# Patient Record
Sex: Female | Born: 1950 | Race: Black or African American | Hispanic: No | Marital: Single | Smoking: Former smoker
Health system: Southern US, Community
[De-identification: ages and names within clinical notes are randomized; demographics above are authoritative.]

## PROBLEM LIST (undated history)

## (undated) ENCOUNTER — Emergency Department (HOSPITAL_COMMUNITY): Admission: EM | Payer: Medicare HMO | Source: Home / Self Care

## (undated) DIAGNOSIS — D649 Anemia, unspecified: Secondary | ICD-10-CM

## (undated) DIAGNOSIS — E785 Hyperlipidemia, unspecified: Secondary | ICD-10-CM

## (undated) DIAGNOSIS — E559 Vitamin D deficiency, unspecified: Secondary | ICD-10-CM

## (undated) DIAGNOSIS — I739 Peripheral vascular disease, unspecified: Secondary | ICD-10-CM

## (undated) DIAGNOSIS — E119 Type 2 diabetes mellitus without complications: Secondary | ICD-10-CM

## (undated) DIAGNOSIS — N183 Chronic kidney disease, stage 3 unspecified: Secondary | ICD-10-CM

## (undated) DIAGNOSIS — I2510 Atherosclerotic heart disease of native coronary artery without angina pectoris: Secondary | ICD-10-CM

## (undated) DIAGNOSIS — E039 Hypothyroidism, unspecified: Secondary | ICD-10-CM

## (undated) DIAGNOSIS — I639 Cerebral infarction, unspecified: Secondary | ICD-10-CM

## (undated) DIAGNOSIS — K219 Gastro-esophageal reflux disease without esophagitis: Secondary | ICD-10-CM

## (undated) DIAGNOSIS — I10 Essential (primary) hypertension: Secondary | ICD-10-CM

## (undated) HISTORY — DX: Atherosclerotic heart disease of native coronary artery without angina pectoris: I25.10

## (undated) HISTORY — DX: Anemia, unspecified: D64.9

## (undated) HISTORY — DX: Vitamin D deficiency, unspecified: E55.9

## (undated) HISTORY — PX: TUBAL LIGATION: SHX77

## (undated) HISTORY — DX: Hypothyroidism, unspecified: E03.9

## (undated) HISTORY — DX: Chronic kidney disease, stage 3 unspecified: N18.30

## (undated) HISTORY — DX: Chronic kidney disease, stage 3 (moderate): N18.3

## (undated) HISTORY — DX: Type 2 diabetes mellitus without complications: E11.9

## (undated) HISTORY — DX: Hyperlipidemia, unspecified: E78.5

---

## 1989-01-10 DIAGNOSIS — E119 Type 2 diabetes mellitus without complications: Secondary | ICD-10-CM | POA: Insufficient documentation

## 1989-01-10 DIAGNOSIS — E1122 Type 2 diabetes mellitus with diabetic chronic kidney disease: Secondary | ICD-10-CM

## 1989-01-10 DIAGNOSIS — N183 Chronic kidney disease, stage 3 (moderate): Secondary | ICD-10-CM

## 1998-11-13 DIAGNOSIS — E119 Type 2 diabetes mellitus without complications: Secondary | ICD-10-CM

## 1998-11-13 HISTORY — DX: Type 2 diabetes mellitus without complications: E11.9

## 1998-12-09 ENCOUNTER — Encounter: Admission: RE | Admit: 1998-12-09 | Discharge: 1998-12-09 | Payer: Self-pay | Admitting: Family Medicine

## 1999-03-17 ENCOUNTER — Emergency Department (HOSPITAL_COMMUNITY): Admission: EM | Admit: 1999-03-17 | Discharge: 1999-03-17 | Payer: Self-pay | Admitting: Emergency Medicine

## 1999-03-28 ENCOUNTER — Encounter: Admission: RE | Admit: 1999-03-28 | Discharge: 1999-03-28 | Payer: Self-pay | Admitting: Family Medicine

## 1999-04-04 ENCOUNTER — Encounter: Admission: RE | Admit: 1999-04-04 | Discharge: 1999-04-04 | Payer: Self-pay | Admitting: Family Medicine

## 1999-04-05 ENCOUNTER — Encounter: Admission: RE | Admit: 1999-04-05 | Discharge: 1999-04-05 | Payer: Self-pay | Admitting: Sports Medicine

## 1999-04-12 ENCOUNTER — Encounter: Admission: RE | Admit: 1999-04-12 | Discharge: 1999-04-12 | Payer: Self-pay | Admitting: Family Medicine

## 1999-07-13 ENCOUNTER — Encounter: Admission: RE | Admit: 1999-07-13 | Discharge: 1999-07-13 | Payer: Self-pay | Admitting: Family Medicine

## 1999-12-13 ENCOUNTER — Encounter: Admission: RE | Admit: 1999-12-13 | Discharge: 1999-12-13 | Payer: Self-pay | Admitting: Sports Medicine

## 2000-02-05 ENCOUNTER — Emergency Department (HOSPITAL_COMMUNITY): Admission: EM | Admit: 2000-02-05 | Discharge: 2000-02-05 | Payer: Self-pay | Admitting: Emergency Medicine

## 2000-04-05 ENCOUNTER — Emergency Department (HOSPITAL_COMMUNITY): Admission: EM | Admit: 2000-04-05 | Discharge: 2000-04-05 | Payer: Self-pay | Admitting: Emergency Medicine

## 2000-04-06 ENCOUNTER — Encounter: Payer: Self-pay | Admitting: Emergency Medicine

## 2000-04-13 ENCOUNTER — Encounter: Admission: RE | Admit: 2000-04-13 | Discharge: 2000-04-13 | Payer: Self-pay | Admitting: Family Medicine

## 2000-04-19 ENCOUNTER — Encounter: Admission: RE | Admit: 2000-04-19 | Discharge: 2000-04-19 | Payer: Self-pay | Admitting: *Deleted

## 2001-01-28 ENCOUNTER — Encounter: Admission: RE | Admit: 2001-01-28 | Discharge: 2001-01-28 | Payer: Self-pay | Admitting: Family Medicine

## 2001-02-22 ENCOUNTER — Encounter: Admission: RE | Admit: 2001-02-22 | Discharge: 2001-02-22 | Payer: Self-pay | Admitting: Family Medicine

## 2001-03-21 ENCOUNTER — Encounter: Admission: RE | Admit: 2001-03-21 | Discharge: 2001-03-21 | Payer: Self-pay | Admitting: Family Medicine

## 2001-03-26 ENCOUNTER — Encounter: Admission: RE | Admit: 2001-03-26 | Discharge: 2001-03-26 | Payer: Self-pay | Admitting: Family Medicine

## 2001-04-04 ENCOUNTER — Encounter: Admission: RE | Admit: 2001-04-04 | Discharge: 2001-04-04 | Payer: Self-pay | Admitting: Family Medicine

## 2001-04-12 ENCOUNTER — Encounter: Admission: RE | Admit: 2001-04-12 | Discharge: 2001-04-12 | Payer: Self-pay | Admitting: Family Medicine

## 2001-04-17 ENCOUNTER — Encounter: Admission: RE | Admit: 2001-04-17 | Discharge: 2001-04-17 | Payer: Self-pay | Admitting: Family Medicine

## 2001-04-22 ENCOUNTER — Encounter: Admission: RE | Admit: 2001-04-22 | Discharge: 2001-04-22 | Payer: Self-pay | Admitting: *Deleted

## 2001-04-23 ENCOUNTER — Ambulatory Visit (HOSPITAL_COMMUNITY): Admission: RE | Admit: 2001-04-23 | Discharge: 2001-04-23 | Payer: Self-pay | Admitting: *Deleted

## 2001-05-06 ENCOUNTER — Encounter: Admission: RE | Admit: 2001-05-06 | Discharge: 2001-05-06 | Payer: Self-pay | Admitting: Family Medicine

## 2001-06-11 ENCOUNTER — Encounter: Payer: Self-pay | Admitting: Emergency Medicine

## 2001-06-11 ENCOUNTER — Inpatient Hospital Stay (HOSPITAL_COMMUNITY): Admission: EM | Admit: 2001-06-11 | Discharge: 2001-06-13 | Payer: Self-pay | Admitting: Emergency Medicine

## 2001-06-12 ENCOUNTER — Encounter: Payer: Self-pay | Admitting: Emergency Medicine

## 2001-06-13 ENCOUNTER — Encounter: Payer: Self-pay | Admitting: Emergency Medicine

## 2001-06-26 ENCOUNTER — Encounter: Payer: Self-pay | Admitting: Family Medicine

## 2001-06-26 ENCOUNTER — Ambulatory Visit (HOSPITAL_COMMUNITY): Admission: RE | Admit: 2001-06-26 | Discharge: 2001-06-26 | Payer: Self-pay | Admitting: Family Medicine

## 2001-07-23 ENCOUNTER — Encounter: Admission: RE | Admit: 2001-07-23 | Discharge: 2001-07-23 | Payer: Self-pay | Admitting: Family Medicine

## 2001-10-16 ENCOUNTER — Encounter: Admission: RE | Admit: 2001-10-16 | Discharge: 2001-10-16 | Payer: Self-pay | Admitting: Family Medicine

## 2002-01-14 ENCOUNTER — Emergency Department (HOSPITAL_COMMUNITY): Admission: EM | Admit: 2002-01-14 | Discharge: 2002-01-15 | Payer: Self-pay | Admitting: *Deleted

## 2002-02-25 ENCOUNTER — Encounter: Admission: RE | Admit: 2002-02-25 | Discharge: 2002-02-25 | Payer: Self-pay | Admitting: Family Medicine

## 2002-03-14 ENCOUNTER — Encounter: Admission: RE | Admit: 2002-03-14 | Discharge: 2002-03-14 | Payer: Self-pay | Admitting: Family Medicine

## 2002-03-18 ENCOUNTER — Encounter: Admission: RE | Admit: 2002-03-18 | Discharge: 2002-03-18 | Payer: Self-pay | Admitting: Sports Medicine

## 2002-04-29 ENCOUNTER — Encounter: Admission: RE | Admit: 2002-04-29 | Discharge: 2002-04-29 | Payer: Self-pay | Admitting: Family Medicine

## 2002-09-09 ENCOUNTER — Encounter: Admission: RE | Admit: 2002-09-09 | Discharge: 2002-09-09 | Payer: Self-pay | Admitting: Family Medicine

## 2002-09-23 ENCOUNTER — Encounter: Admission: RE | Admit: 2002-09-23 | Discharge: 2002-09-23 | Payer: Self-pay | Admitting: Family Medicine

## 2002-09-25 ENCOUNTER — Encounter: Admission: RE | Admit: 2002-09-25 | Discharge: 2002-09-25 | Payer: Self-pay | Admitting: Sports Medicine

## 2002-10-15 ENCOUNTER — Encounter: Admission: RE | Admit: 2002-10-15 | Discharge: 2002-10-15 | Payer: Self-pay | Admitting: Family Medicine

## 2002-10-30 ENCOUNTER — Encounter: Admission: RE | Admit: 2002-10-30 | Discharge: 2002-10-30 | Payer: Self-pay | Admitting: Family Medicine

## 2003-02-26 ENCOUNTER — Encounter: Payer: Self-pay | Admitting: Family Medicine

## 2003-02-26 ENCOUNTER — Encounter: Admission: RE | Admit: 2003-02-26 | Discharge: 2003-02-26 | Payer: Self-pay | Admitting: Sports Medicine

## 2003-02-26 ENCOUNTER — Inpatient Hospital Stay (HOSPITAL_COMMUNITY): Admission: AD | Admit: 2003-02-26 | Discharge: 2003-02-28 | Payer: Self-pay | Admitting: Family Medicine

## 2003-02-27 ENCOUNTER — Encounter: Payer: Self-pay | Admitting: Cardiology

## 2003-03-11 ENCOUNTER — Encounter: Admission: RE | Admit: 2003-03-11 | Discharge: 2003-03-11 | Payer: Self-pay | Admitting: Family Medicine

## 2003-03-31 ENCOUNTER — Encounter: Admission: RE | Admit: 2003-03-31 | Discharge: 2003-06-29 | Payer: Self-pay | Admitting: Sports Medicine

## 2003-05-12 ENCOUNTER — Encounter: Admission: RE | Admit: 2003-05-12 | Discharge: 2003-05-12 | Payer: Self-pay | Admitting: Family Medicine

## 2003-06-26 ENCOUNTER — Encounter (INDEPENDENT_AMBULATORY_CARE_PROVIDER_SITE_OTHER): Payer: Self-pay | Admitting: *Deleted

## 2003-06-26 ENCOUNTER — Ambulatory Visit (HOSPITAL_COMMUNITY): Admission: RE | Admit: 2003-06-26 | Discharge: 2003-06-26 | Payer: Self-pay | Admitting: Gastroenterology

## 2003-08-07 ENCOUNTER — Encounter: Admission: RE | Admit: 2003-08-07 | Discharge: 2003-08-07 | Payer: Self-pay | Admitting: Family Medicine

## 2003-09-24 ENCOUNTER — Encounter: Admission: RE | Admit: 2003-09-24 | Discharge: 2003-09-24 | Payer: Self-pay | Admitting: Sports Medicine

## 2003-10-07 ENCOUNTER — Encounter: Admission: RE | Admit: 2003-10-07 | Discharge: 2003-10-07 | Payer: Self-pay | Admitting: Family Medicine

## 2003-11-10 ENCOUNTER — Encounter: Admission: RE | Admit: 2003-11-10 | Discharge: 2003-11-10 | Payer: Self-pay | Admitting: Family Medicine

## 2003-11-14 ENCOUNTER — Encounter (INDEPENDENT_AMBULATORY_CARE_PROVIDER_SITE_OTHER): Payer: Self-pay | Admitting: *Deleted

## 2003-11-14 LAB — CONVERTED CEMR LAB

## 2003-11-27 ENCOUNTER — Encounter: Admission: RE | Admit: 2003-11-27 | Discharge: 2003-11-27 | Payer: Self-pay | Admitting: Family Medicine

## 2003-12-17 ENCOUNTER — Encounter: Admission: RE | Admit: 2003-12-17 | Discharge: 2003-12-17 | Payer: Self-pay | Admitting: Family Medicine

## 2004-02-11 ENCOUNTER — Encounter: Admission: RE | Admit: 2004-02-11 | Discharge: 2004-02-11 | Payer: Self-pay | Admitting: Sports Medicine

## 2004-02-23 ENCOUNTER — Encounter: Admission: RE | Admit: 2004-02-23 | Discharge: 2004-02-23 | Payer: Self-pay | Admitting: Sports Medicine

## 2004-03-09 ENCOUNTER — Encounter: Admission: RE | Admit: 2004-03-09 | Discharge: 2004-03-09 | Payer: Self-pay | Admitting: Family Medicine

## 2004-03-24 ENCOUNTER — Encounter: Admission: RE | Admit: 2004-03-24 | Discharge: 2004-03-24 | Payer: Self-pay | Admitting: Sports Medicine

## 2004-03-31 ENCOUNTER — Encounter: Admission: RE | Admit: 2004-03-31 | Discharge: 2004-03-31 | Payer: Self-pay | Admitting: Sports Medicine

## 2004-04-25 ENCOUNTER — Encounter: Admission: RE | Admit: 2004-04-25 | Discharge: 2004-04-25 | Payer: Self-pay | Admitting: Family Medicine

## 2004-06-13 ENCOUNTER — Encounter: Admission: RE | Admit: 2004-06-13 | Discharge: 2004-06-13 | Payer: Self-pay | Admitting: Sports Medicine

## 2004-07-22 ENCOUNTER — Inpatient Hospital Stay (HOSPITAL_COMMUNITY): Admission: EM | Admit: 2004-07-22 | Discharge: 2004-07-25 | Payer: Self-pay | Admitting: Emergency Medicine

## 2004-07-29 ENCOUNTER — Ambulatory Visit: Payer: Self-pay | Admitting: Family Medicine

## 2004-09-01 ENCOUNTER — Ambulatory Visit: Payer: Self-pay | Admitting: Family Medicine

## 2004-09-05 ENCOUNTER — Ambulatory Visit: Payer: Self-pay | Admitting: Family Medicine

## 2004-09-06 ENCOUNTER — Ambulatory Visit: Payer: Self-pay | Admitting: *Deleted

## 2004-09-26 ENCOUNTER — Ambulatory Visit: Payer: Self-pay | Admitting: Family Medicine

## 2004-10-28 ENCOUNTER — Ambulatory Visit: Payer: Self-pay | Admitting: Family Medicine

## 2005-01-04 ENCOUNTER — Ambulatory Visit: Payer: Self-pay | Admitting: Nurse Practitioner

## 2005-01-20 ENCOUNTER — Ambulatory Visit: Payer: Self-pay | Admitting: Family Medicine

## 2005-02-27 ENCOUNTER — Ambulatory Visit: Payer: Self-pay | Admitting: Family Medicine

## 2005-03-02 ENCOUNTER — Ambulatory Visit: Payer: Self-pay | Admitting: Internal Medicine

## 2005-04-07 ENCOUNTER — Ambulatory Visit: Payer: Self-pay | Admitting: Family Medicine

## 2005-04-17 ENCOUNTER — Emergency Department (HOSPITAL_COMMUNITY): Admission: EM | Admit: 2005-04-17 | Discharge: 2005-04-17 | Payer: Self-pay | Admitting: Emergency Medicine

## 2005-04-21 ENCOUNTER — Ambulatory Visit: Payer: Self-pay | Admitting: Family Medicine

## 2005-06-29 ENCOUNTER — Ambulatory Visit: Payer: Self-pay | Admitting: Nurse Practitioner

## 2005-07-21 ENCOUNTER — Ambulatory Visit: Payer: Self-pay | Admitting: Family Medicine

## 2005-08-07 ENCOUNTER — Ambulatory Visit: Payer: Self-pay | Admitting: Family Medicine

## 2005-08-28 ENCOUNTER — Encounter: Admission: RE | Admit: 2005-08-28 | Discharge: 2005-08-28 | Payer: Self-pay | Admitting: Family Medicine

## 2006-02-02 ENCOUNTER — Ambulatory Visit: Payer: Self-pay | Admitting: Family Medicine

## 2006-04-30 ENCOUNTER — Ambulatory Visit: Payer: Self-pay | Admitting: Family Medicine

## 2006-05-01 ENCOUNTER — Ambulatory Visit: Payer: Self-pay | Admitting: Family Medicine

## 2006-06-25 ENCOUNTER — Ambulatory Visit: Payer: Self-pay | Admitting: Family Medicine

## 2006-07-31 ENCOUNTER — Ambulatory Visit: Payer: Self-pay | Admitting: Family Medicine

## 2006-09-26 ENCOUNTER — Encounter: Admission: RE | Admit: 2006-09-26 | Discharge: 2006-09-26 | Payer: Self-pay | Admitting: Family Medicine

## 2006-10-03 ENCOUNTER — Ambulatory Visit: Payer: Self-pay | Admitting: Family Medicine

## 2006-10-11 ENCOUNTER — Ambulatory Visit: Payer: Self-pay | Admitting: *Deleted

## 2007-01-04 ENCOUNTER — Ambulatory Visit: Payer: Self-pay | Admitting: Internal Medicine

## 2007-01-10 DIAGNOSIS — E039 Hypothyroidism, unspecified: Secondary | ICD-10-CM

## 2007-01-10 DIAGNOSIS — Z87891 Personal history of nicotine dependence: Secondary | ICD-10-CM

## 2007-01-10 DIAGNOSIS — K921 Melena: Secondary | ICD-10-CM | POA: Insufficient documentation

## 2007-01-10 DIAGNOSIS — R809 Proteinuria, unspecified: Secondary | ICD-10-CM

## 2007-01-10 DIAGNOSIS — I10 Essential (primary) hypertension: Secondary | ICD-10-CM | POA: Insufficient documentation

## 2007-01-10 DIAGNOSIS — Z8673 Personal history of transient ischemic attack (TIA), and cerebral infarction without residual deficits: Secondary | ICD-10-CM

## 2007-01-10 DIAGNOSIS — R269 Unspecified abnormalities of gait and mobility: Secondary | ICD-10-CM

## 2007-01-10 DIAGNOSIS — E785 Hyperlipidemia, unspecified: Secondary | ICD-10-CM | POA: Insufficient documentation

## 2007-01-11 ENCOUNTER — Encounter (INDEPENDENT_AMBULATORY_CARE_PROVIDER_SITE_OTHER): Payer: Self-pay | Admitting: *Deleted

## 2007-04-04 ENCOUNTER — Ambulatory Visit: Payer: Self-pay | Admitting: Internal Medicine

## 2007-06-24 ENCOUNTER — Inpatient Hospital Stay (HOSPITAL_COMMUNITY): Admission: EM | Admit: 2007-06-24 | Discharge: 2007-06-27 | Payer: Self-pay | Admitting: Emergency Medicine

## 2007-07-31 ENCOUNTER — Encounter (INDEPENDENT_AMBULATORY_CARE_PROVIDER_SITE_OTHER): Payer: Self-pay | Admitting: *Deleted

## 2007-11-13 ENCOUNTER — Encounter: Admission: RE | Admit: 2007-11-13 | Discharge: 2007-11-13 | Payer: Self-pay | Admitting: Family Medicine

## 2007-11-22 ENCOUNTER — Ambulatory Visit: Payer: Self-pay | Admitting: Family Medicine

## 2007-11-22 LAB — CONVERTED CEMR LAB: Microalb, Ur: 0.96 mg/dL (ref 0.00–1.89)

## 2008-01-02 ENCOUNTER — Ambulatory Visit: Payer: Self-pay | Admitting: Family Medicine

## 2008-01-31 ENCOUNTER — Ambulatory Visit: Payer: Self-pay | Admitting: Family Medicine

## 2008-03-02 ENCOUNTER — Encounter (INDEPENDENT_AMBULATORY_CARE_PROVIDER_SITE_OTHER): Payer: Self-pay | Admitting: Family Medicine

## 2008-03-02 ENCOUNTER — Ambulatory Visit: Payer: Self-pay | Admitting: Internal Medicine

## 2008-07-16 ENCOUNTER — Ambulatory Visit: Payer: Self-pay | Admitting: Internal Medicine

## 2008-08-11 ENCOUNTER — Ambulatory Visit (HOSPITAL_COMMUNITY): Admission: RE | Admit: 2008-08-11 | Discharge: 2008-08-11 | Payer: Self-pay | Admitting: Family Medicine

## 2008-09-03 ENCOUNTER — Encounter (INDEPENDENT_AMBULATORY_CARE_PROVIDER_SITE_OTHER): Payer: Self-pay | Admitting: Family Medicine

## 2008-12-28 ENCOUNTER — Encounter: Admission: RE | Admit: 2008-12-28 | Discharge: 2009-01-26 | Payer: Self-pay | Admitting: Family Medicine

## 2009-12-03 ENCOUNTER — Ambulatory Visit: Payer: Self-pay | Admitting: Family Medicine

## 2010-01-04 ENCOUNTER — Ambulatory Visit: Payer: Self-pay | Admitting: Family Medicine

## 2010-01-04 LAB — CONVERTED CEMR LAB
AST: 16 units/L (ref 0–37)
Albumin: 4.7 g/dL (ref 3.5–5.2)
Basophils Absolute: 0 10*3/uL (ref 0.0–0.1)
Basophils Relative: 0 % (ref 0–1)
Chloride: 101 meq/L (ref 96–112)
Eosinophils Relative: 1 % (ref 0–5)
Glucose, Bld: 143 mg/dL — ABNORMAL HIGH (ref 70–99)
HDL: 42 mg/dL (ref >39)
LDL Cholesterol: 65 mg/dL (ref 0–99)
Lymphs Abs: 2.6 10*3/uL (ref 0.7–4.0)
Neutro Abs: 3 10*3/uL (ref 1.7–7.7)
Neutrophils Relative %: 50 % (ref 43–77)
Potassium: 4 meq/L (ref 3.5–5.3)
Total CHOL/HDL Ratio: 4.4
VLDL: 76 mg/dL — ABNORMAL HIGH (ref 0–40)

## 2010-04-05 ENCOUNTER — Ambulatory Visit: Payer: Self-pay | Admitting: Family Medicine

## 2010-04-05 LAB — CONVERTED CEMR LAB: TSH: 14.138 microintl units/mL — ABNORMAL HIGH (ref 0.350–4.500)

## 2010-05-10 ENCOUNTER — Ambulatory Visit (HOSPITAL_COMMUNITY): Admission: RE | Admit: 2010-05-10 | Discharge: 2010-05-10 | Payer: Self-pay | Admitting: Family Medicine

## 2010-05-10 ENCOUNTER — Encounter (INDEPENDENT_AMBULATORY_CARE_PROVIDER_SITE_OTHER): Payer: Self-pay | Admitting: Family Medicine

## 2010-05-10 ENCOUNTER — Ambulatory Visit: Payer: Self-pay | Admitting: Surgery

## 2010-07-21 ENCOUNTER — Encounter (INDEPENDENT_AMBULATORY_CARE_PROVIDER_SITE_OTHER): Payer: Self-pay | Admitting: *Deleted

## 2010-09-05 ENCOUNTER — Encounter (INDEPENDENT_AMBULATORY_CARE_PROVIDER_SITE_OTHER): Payer: Self-pay | Admitting: *Deleted

## 2010-11-08 ENCOUNTER — Encounter (INDEPENDENT_AMBULATORY_CARE_PROVIDER_SITE_OTHER): Payer: Self-pay | Admitting: Family Medicine

## 2010-11-08 LAB — CONVERTED CEMR LAB
Chloride: 105 meq/L (ref 96–112)
Glucose, Bld: 184 mg/dL — ABNORMAL HIGH (ref 70–99)
Microalb, Ur: 1.49 mg/dL (ref 0.00–1.89)
Potassium: 4.2 meq/L (ref 3.5–5.3)
TSH: 4.315 microintl units/mL (ref 0.350–4.500)

## 2010-12-04 ENCOUNTER — Encounter: Payer: Self-pay | Admitting: Family Medicine

## 2010-12-15 NOTE — Letter (Signed)
Summary: New Patient letter  [facility]  [address] [place], [place] [address]   Phone: [phone]  Fax: [phone]       09/05/2010 MRN: [id number]  [facility] [address] [place], [place]  [address]  Dear Ms. Lucci,  Welcome to the Gastroenterology Division at [facility].    You are scheduled to see Dr. Christella Hartigan on 10/18/2010 at 3:30PM on the 3rd floor at [facility], [address].  We ask that you try to arrive at our office 15 minutes prior to your appointment time to allow for check-in.  We would like you to complete the enclosed self-administered evaluation form prior to your visit and bring it with you on the day of your appointment.  We will review it with you.  Also, please bring a complete list of all your medications or, if you prefer, bring the medication bottles and we will list them.  Please bring your insurance card so that we may make a copy of it.  If your insurance requires a referral to see a specialist, please bring your referral form from your primary care physician.  Co-payments are due at the time of your visit and may be paid by cash, check or credit card.     Your office visit will consist of a consult with your physician (includes a physical exam), any laboratory testing he/she may order, scheduling of any necessary diagnostic testing (e.g. x-ray, ultrasound, CT-scan), and scheduling of a procedure (e.g. Endoscopy, Colonoscopy) if required.  Please allow enough time on your schedule to allow for any/all of these possibilities.    If you cannot keep your appointment, please call [phone] to cancel or reschedule prior to your appointment date.  This allows [place] the opportunity to schedule an appointment for another patient in need of care.  If you do not cancel or reschedule by 5 p.m. the business day prior to your appointment date, you will be charged a $50.00 late cancellation/no-show fee.    Thank you for choosing [facility] for your medical needs.  We appreciate the opportunity to care for you.  Please visit [place] at our website  to learn more about our practice.                     Sincerely,                                                             The Gastroenterology Division

## 2010-12-15 NOTE — Letter (Signed)
Summary: [facility] Referral (unable to schedule) Notification  [facility]  [address] [place], [place] [address]   Phone: [phone]  Fax: [phone]      July 21, 2010 [name] [birth year] MRN: [id number]   [facility] [address] [place], [place]  [address]   Dear Dr.MCPHERSON:   Thank you for your kind referral of the above patient. We have attempted to schedule the recommended COLONOSCOPY but have been unable to schedule because:  _X_ The patient was not available by phone and/or has not returned our calls.  __ The patient declined to schedule the procedure at this time.  We appreciate the referral and hope that we will have the opportunity to treat this patient in the future.    Sincerely,   [facility]  Vania Rea. Jarold Motto M.D. Hedwig Morton. Juanda Chance M.D. Venita Lick. Russella Dar M.D. Wilhemina Bonito. Marina Goodell M.D. Barbette Hair. Arlyce Dice M.D. Iva Boop M.D. Cheron Every.D.

## 2011-01-12 DIAGNOSIS — I639 Cerebral infarction, unspecified: Secondary | ICD-10-CM

## 2011-01-12 HISTORY — DX: Cerebral infarction, unspecified: I63.9

## 2011-01-19 ENCOUNTER — Inpatient Hospital Stay (INDEPENDENT_AMBULATORY_CARE_PROVIDER_SITE_OTHER)
Admission: RE | Admit: 2011-01-19 | Discharge: 2011-01-19 | Disposition: A | Discharge status: Home / Self Care | Payer: Medicaid Other | Source: Ambulatory Visit | Attending: Emergency Medicine | Admitting: Emergency Medicine

## 2011-01-19 DIAGNOSIS — H8110 Benign paroxysmal vertigo, unspecified ear: Secondary | ICD-10-CM

## 2011-01-19 DIAGNOSIS — I10 Essential (primary) hypertension: Secondary | ICD-10-CM

## 2011-01-19 DIAGNOSIS — E119 Type 2 diabetes mellitus without complications: Secondary | ICD-10-CM

## 2011-01-19 DIAGNOSIS — S139XXA Sprain of joints and ligaments of unspecified parts of neck, initial encounter: Secondary | ICD-10-CM

## 2011-01-19 LAB — POCT I-STAT, CHEM 8
Calcium, Ion: 1.2 mmol/L (ref 1.12–1.32)
Chloride: 101 mEq/L (ref 96–112)
Potassium: 3.8 mEq/L (ref 3.5–5.1)
Sodium: 139 mEq/L (ref 135–145)

## 2011-01-27 ENCOUNTER — Emergency Department (HOSPITAL_COMMUNITY): Payer: Medicaid Other

## 2011-01-27 ENCOUNTER — Inpatient Hospital Stay (HOSPITAL_COMMUNITY)
Admission: EM | Admit: 2011-01-27 | Discharge: 2011-01-31 | DRG: 065 | Disposition: A | Discharge status: Home Health | Payer: Medicaid Other | Attending: Internal Medicine | Admitting: Internal Medicine

## 2011-01-27 DIAGNOSIS — E890 Postprocedural hypothyroidism: Secondary | ICD-10-CM | POA: Diagnosis present

## 2011-01-27 DIAGNOSIS — IMO0001 Reserved for inherently not codable concepts without codable children: Secondary | ICD-10-CM | POA: Diagnosis present

## 2011-01-27 DIAGNOSIS — E785 Hyperlipidemia, unspecified: Secondary | ICD-10-CM | POA: Diagnosis present

## 2011-01-27 DIAGNOSIS — Z8673 Personal history of transient ischemic attack (TIA), and cerebral infarction without residual deficits: Secondary | ICD-10-CM

## 2011-01-27 DIAGNOSIS — N179 Acute kidney failure, unspecified: Secondary | ICD-10-CM | POA: Diagnosis present

## 2011-01-27 DIAGNOSIS — N390 Urinary tract infection, site not specified: Secondary | ICD-10-CM | POA: Diagnosis present

## 2011-01-27 DIAGNOSIS — F17200 Nicotine dependence, unspecified, uncomplicated: Secondary | ICD-10-CM | POA: Diagnosis present

## 2011-01-27 DIAGNOSIS — I129 Hypertensive chronic kidney disease with stage 1 through stage 4 chronic kidney disease, or unspecified chronic kidney disease: Secondary | ICD-10-CM | POA: Diagnosis present

## 2011-01-27 DIAGNOSIS — I739 Peripheral vascular disease, unspecified: Secondary | ICD-10-CM | POA: Diagnosis present

## 2011-01-27 DIAGNOSIS — K219 Gastro-esophageal reflux disease without esophagitis: Secondary | ICD-10-CM | POA: Diagnosis present

## 2011-01-27 DIAGNOSIS — I6350 Cerebral infarction due to unspecified occlusion or stenosis of unspecified cerebral artery: Principal | ICD-10-CM | POA: Diagnosis present

## 2011-01-27 DIAGNOSIS — R4789 Other speech disturbances: Secondary | ICD-10-CM | POA: Diagnosis present

## 2011-01-27 DIAGNOSIS — R42 Dizziness and giddiness: Secondary | ICD-10-CM | POA: Diagnosis present

## 2011-01-27 DIAGNOSIS — N189 Chronic kidney disease, unspecified: Secondary | ICD-10-CM | POA: Diagnosis present

## 2011-01-27 LAB — CBC
HCT: 39.4 % (ref 36.0–46.0)
MCV: 91.2 fL (ref 78.0–100.0)
Platelets: 235 10*3/uL (ref 150–400)
RBC: 4.32 MIL/uL (ref 3.87–5.11)
RDW: 15.5 % (ref 11.5–15.5)
WBC: 7 10*3/uL (ref 4.0–10.5)

## 2011-01-27 LAB — DIFFERENTIAL
Basophils Relative: 0 % (ref 0–1)
Lymphocytes Relative: 50 % — ABNORMAL HIGH (ref 12–46)
Lymphs Abs: 3.5 10*3/uL (ref 0.7–4.0)
Monocytes Absolute: 0.5 10*3/uL (ref 0.1–1.0)
Monocytes Relative: 7 % (ref 3–12)
Neutro Abs: 2.9 10*3/uL (ref 1.7–7.7)

## 2011-01-27 LAB — COMPREHENSIVE METABOLIC PANEL
ALT: 14 U/L (ref 0–35)
Albumin: 4.1 g/dL (ref 3.5–5.2)
Alkaline Phosphatase: 66 U/L (ref 39–117)
Creatinine, Ser: 1.45 mg/dL — ABNORMAL HIGH (ref 0.4–1.2)
GFR calc Af Amer: 45 mL/min — ABNORMAL LOW (ref >60)
GFR calc non Af Amer: 37 mL/min — ABNORMAL LOW (ref >60)
Sodium: 140 mEq/L (ref 135–145)
Total Bilirubin: 0.4 mg/dL (ref 0.3–1.2)

## 2011-01-27 LAB — URINALYSIS, ROUTINE W REFLEX MICROSCOPIC
Bilirubin Urine: NEGATIVE
Glucose, UA: NEGATIVE mg/dL
Ketones, ur: NEGATIVE mg/dL
Specific Gravity, Urine: 1.023 (ref 1.005–1.030)
Urobilinogen, UA: 0.2 mg/dL (ref 0.0–1.0)
pH: 5.5 (ref 5.0–8.0)

## 2011-01-27 LAB — POCT CARDIAC MARKERS: CKMB, poc: 1.1 ng/mL (ref 1.0–8.0)

## 2011-01-27 LAB — URINE MICROSCOPIC-ADD ON

## 2011-01-27 LAB — PROTIME-INR
INR: 0.9 (ref 0.00–1.49)
Prothrombin Time: 12.4 seconds (ref 11.6–15.2)

## 2011-01-27 LAB — APTT: aPTT: 46 seconds — ABNORMAL HIGH (ref 24–37)

## 2011-01-28 ENCOUNTER — Inpatient Hospital Stay (HOSPITAL_COMMUNITY): Payer: Medicaid Other

## 2011-01-28 DIAGNOSIS — I6789 Other cerebrovascular disease: Secondary | ICD-10-CM

## 2011-01-28 LAB — BASIC METABOLIC PANEL
BUN: 28 mg/dL — ABNORMAL HIGH (ref 6–23)
Calcium: 9.6 mg/dL (ref 8.4–10.5)
GFR calc non Af Amer: 45 mL/min — ABNORMAL LOW (ref >60)
Glucose, Bld: 187 mg/dL — ABNORMAL HIGH (ref 70–99)
Potassium: 3.9 mEq/L (ref 3.5–5.1)
Sodium: 137 mEq/L (ref 135–145)

## 2011-01-28 LAB — GLUCOSE, CAPILLARY
Glucose-Capillary: 316 mg/dL — ABNORMAL HIGH (ref 70–99)
Glucose-Capillary: 175 mg/dL — ABNORMAL HIGH (ref 70–99)

## 2011-01-28 LAB — LIPID PANEL
Cholesterol: 170 mg/dL (ref 0–200)
HDL: 30 mg/dL — ABNORMAL LOW (ref >39)
Triglycerides: 431 mg/dL — ABNORMAL HIGH (ref <150)

## 2011-01-28 LAB — HOMOCYSTEINE: Homocysteine: 11.7 umol/L (ref 4.0–15.4)

## 2011-01-29 LAB — CBC
MCH: 28.8 pg (ref 26.0–34.0)
MCV: 90.4 fL (ref 78.0–100.0)
Platelets: 206 10*3/uL (ref 150–400)
RBC: 3.96 MIL/uL (ref 3.87–5.11)
RDW: 15.2 % (ref 11.5–15.5)
WBC: 6.4 10*3/uL (ref 4.0–10.5)

## 2011-01-29 LAB — BASIC METABOLIC PANEL
BUN: 26 mg/dL — ABNORMAL HIGH (ref 6–23)
Chloride: 104 mEq/L (ref 96–112)
Creatinine, Ser: 1.27 mg/dL — ABNORMAL HIGH (ref 0.4–1.2)
GFR calc Af Amer: 52 mL/min — ABNORMAL LOW (ref >60)
GFR calc non Af Amer: 43 mL/min — ABNORMAL LOW (ref >60)

## 2011-01-29 LAB — GLUCOSE, CAPILLARY
Glucose-Capillary: 214 mg/dL — ABNORMAL HIGH (ref 70–99)
Glucose-Capillary: 187 mg/dL — ABNORMAL HIGH (ref 70–99)

## 2011-01-30 DIAGNOSIS — I6350 Cerebral infarction due to unspecified occlusion or stenosis of unspecified cerebral artery: Secondary | ICD-10-CM

## 2011-01-30 LAB — BASIC METABOLIC PANEL
BUN: 24 mg/dL — ABNORMAL HIGH (ref 6–23)
CO2: 28 mEq/L (ref 19–32)
Calcium: 9.5 mg/dL (ref 8.4–10.5)
Creatinine, Ser: 1.24 mg/dL — ABNORMAL HIGH (ref 0.4–1.2)
GFR calc non Af Amer: 44 mL/min — ABNORMAL LOW (ref >60)
Glucose, Bld: 162 mg/dL — ABNORMAL HIGH (ref 70–99)

## 2011-01-30 LAB — PROTEIN C ACTIVITY: Protein C Activity: 191 % — ABNORMAL HIGH (ref 75–133)

## 2011-01-30 LAB — GLUCOSE, CAPILLARY

## 2011-01-30 LAB — PROTEIN S ACTIVITY: Protein S Activity: 97 % (ref 69–129)

## 2011-01-30 LAB — LUPUS ANTICOAGULANT PANEL: PTT Lupus Anticoagulant: 43 secs (ref 30.0–45.6)

## 2011-01-30 LAB — CARDIOLIPIN ANTIBODIES, IGG, IGM, IGA: Anticardiolipin IgM: 3 MPL U/mL — ABNORMAL LOW (ref <11)

## 2011-01-30 LAB — ANTITHROMBIN III: AntiThromb III Func: 102 % (ref 76–126)

## 2011-01-31 LAB — GLUCOSE, CAPILLARY
Glucose-Capillary: 214 mg/dL — ABNORMAL HIGH (ref 70–99)
Glucose-Capillary: 266 mg/dL — ABNORMAL HIGH (ref 70–99)
Glucose-Capillary: 250 mg/dL — ABNORMAL HIGH (ref 70–99)

## 2011-01-31 LAB — PROTEIN C, TOTAL: Protein C, Total: >150 % — ABNORMAL HIGH (ref 70–140)

## 2011-01-31 LAB — FACTOR 5 LEIDEN

## 2011-01-31 LAB — PROTHROMBIN GENE MUTATION

## 2011-01-31 NOTE — Consult Note (Signed)
  [name], [name]                ACCOUNT NO.:  [id number]  MEDICAL RECORD NO.:  [id number]           PATIENT TYPE:  I  LOCATION:  1520                         FACILITY:  [facility]  PHYSICIAN:  Thana Farr, MD    DATE OF BIRTH:  1950/11/17  DATE OF CONSULTATION: DATE OF DISCHARGE:                                CONSULTATION   ADDENDUM: The patient also reports that she has significant neck pain and back pain since her initial stroke in 2004.  Has been tried on tramadol without improvement of her symptoms.  On examination of the back region and cervical region, the patient has muscle contraction noted in the trapezius muscles on the right and then periscapular muscles on the left.  There is pain with palpation in these areas as well.  ASSESSMENT:  Muscle contraction pain.  PLAN:  The patient has been tried on tramadol without benefit, discontinue tramadol and start Flexeril 10 mg p.o. t.i.d. p.r.n. pain.          ______________________________ Thana Farr, MD     LR/MEDQ  D:  01/28/2011  T:  01/29/2011  Job:  [id number]  Electronically Signed by Thana Farr MD on 01/31/2011 11:23:44 AM

## 2011-01-31 NOTE — Consult Note (Signed)
[name], [name]                ACCOUNT NO.:  [id number]  MEDICAL RECORD NO.:  [id number]           PATIENT TYPE:  I  LOCATION:  1520                         FACILITY:  [facility]  PHYSICIAN:  Thana Farr, MD    DATE OF BIRTH:  [birth year]  DATE OF CONSULTATION:  01/28/2011 DATE OF DISCHARGE:                                CONSULTATION   REFERRING PHYSICIAN:  Hartley Barefoot, MD  HISTORY:  [name] is a 60 year old female that about a month ago began to experience some dizziness. Was seen and diagnosed with vertigo.  Then approximately 1 week ago, developed some left-sided weakness and difficulty with balance.  Over the past few days, developed difficulty with speech as well, was brought in for evaluation.  The patient does have a history of stroke in 2004.  Reports that her only residual from that was some right-sided neck pain and problems with her ear. The consult called for further recommendations.  PAST MEDICAL HISTORY: 1. Hypertension. 2. Diabetes. 3. Hypercholesterolemia. 4. Graves disease, status post radioactive iodine and ablation, now     with hypothyroidism. 5. GERD. 6. Peripheral vascular disease. 7. Stroke in 2004.  SOCIAL HISTORY:  The patient smokes.  She has no history of alcohol or illicit drug abuse.  She is a housewife.  MEDICATIONS AT HOME:  Plavix, metformin, levothyroxine, lisinopril, hydrochlorothiazide.  PHYSICAL EXAMINATION:  VITAL SIGNS:  Blood pressure 134/70, heart rate 70, respiratory rate 18, T-max 98.2. MENTAL STATUS TESTING:  The patient is alert and oriented.  She can follow commands without difficulty.  Speech is fluent. CRANIAL NERVE TESTING:  II visual fields intact.  III, IV, VI extraocular movements intact.  V and VII decreased and right nasolabial fold.  VIII grossly intact.  IX and VII positive gag.  XI bilateral shoulder shrug.  XII midline tongue extension. MOTOR EXAM:  The patient has 5/5 strength in the bilateral  upper extremities.  No drift is noted.  In the lower extremities, the patient is 4-/5  strength in the left lower extremity, 5-/5 strength in the right lower extremity.  In lifting the left lower extremity, the patient gives no reciprocal downward movement of the right lower extremity. Each time asked to lift that leg is less and less effective. SENSORY TEST:  Pinprick and light touch are intact bilaterally.  Deep tendon reflexes are 2+ in the upper extremities, 1+ in the knees, and absent at the ankles.  Plantars meet bilaterally. CEREBELLAR TESTING:  Finger-to-nose intact.  Heel-to-shin unable to be performed with the left lower extremity.  Intact with the right lower extremity.  LABORATORY DATA:  Shows a white blood cell count of 7.0, platelet count 235, hemoglobin/hematocrit 12.6 and 39.4.  PT/INR, PTT 12.4, 0.9, 0.6 respectively.  Glucose 187, sodium 137, potassium 3.9, chloride and CO2 of 99 and 28 respectively.  BUN and creatinine 28 and 1.22 respectively. Calcium 9.6, hemoglobin A1c 9.7.  CK 113, triglycerides 431, cholesterol 170, homocysteine 11.7.  Echocardiogram results reviewed and show no evidence of intracardiac thrombi.  MRI of the brain shows an acute to subacute infarct in the genu of in the  mid body of the right corpus callosum.  MRA shows no significant large vessel disease.  There is significant small vessel disease bilaterally.  All films are reviewed today.  ASSESSMENT:  [name] is a 60 year old female with multiple risk factors for stroke to include hypertension, diabetes, hypercholesterolemia, and tobacco abuse admitted with new strokes related to her small vessel disease.  The patient was counseled at length about the relationship of her poorly controlled risk factors and her recurrent strokes.  She is on Plavix at home.  Due to the diffuse  nature of her small vessel nature stenoses, the patient is not a candidate for invasive intervention at this time.   The patient will need to remain on medical management with risk factors to be addressed as well.  On presentation, the patient was not a t-PA candidate with her symptoms having lasted for greater than 5 days  prior to presentation.  PLAN: 1. Start aspirin 81 mg p.o. daily. 2. Risk factor modification. 3. Asked to stop smoking. 4. Agree with PT/OT.          ______________________________ Thana Farr, MD     LR/MEDQ  D:  01/28/2011  T:  01/29/2011  Job:  [id number]  Electronically Signed by Thana Farr MD on 01/31/2011 11:30:11 AM

## 2011-02-07 NOTE — Discharge Summary (Signed)
[name], [name]                ACCOUNT NO.:  [id number]  MEDICAL RECORD NO.:  [id number]           PATIENT TYPE:  I  LOCATION:  1520                         FACILITY:  [facility]  PHYSICIAN:  Hartley Barefoot, MD    DATE OF BIRTH:  Oct 21, 1951  DATE OF ADMISSION:  01/27/2011 DATE OF DISCHARGE:  01/31/2011                              DISCHARGE SUMMARY   DISCHARGE DIAGNOSES: 1. 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 2. Hypertension, uncontrolled. 3. Acute-on-chronic renal failure, improved. 4. Diabetes, uncontrolled. 5. Urinary tract infection.  OTHER PAST MEDICAL HISTORY: 1. Hyperlipidemia. 2. History of Graves disease, status post radiation iodine ablation     resulting in hypothyroidism. 3. Gastroesophageal reflux disease. 4. Peripheral vascular disease.  DISCHARGE MEDICATIONS: 1. Aspirin 81 mg p.o. daily. 2. Gemfibrozil 600 mg twice daily before meals. 3. Hydrochlorothiazide 12.5 mg by mouth daily. 4. Lantus 15 units subcutaneous daily. 5. Norvasc 2.5 mg p.o. daily. 6. Levothyroxine 125 mcg every morning. 7. Plavix 75 mg p.o. daily.  MEDICATIONS STOPPED DURING THIS HOSPITALIZATION:  Metformin and lisinopril.  DISPOSITION AND FOLLOWUP:  [name] will need to follow up with her primary care physician at [facility] to follow up blood sugar level and to consider restarting on metformin if creatinine allows it.  Will need also to consider starting her back on lisinopril if renal function is stable.  She will need physical therapy and speech therapy at home.  RADIOGRAPHIC STUDIES: 1. CT head showed possible acute infarct involving the body of the     corpus callosum bilateral, less on the right.  Multiple sclerosis     is also possibility.  Old subacute right basilar ganglia lacunar     infarct.  Diffuse cerebral and cerebellar cortical atrophy and mild     chronic small vessel primary ischemic changes in both cerebral     hemispheres. 2. MRI of head.   MRI showed acute/subacute infarct involving the genu     and mid body of the right corpus callosum.  Periventricular white     matter changes are advanced for age.  This likely reflects the     sequelae of chronic microvascular ischemia.  Remote lacunar infarct     involving the left and right corona radiate.  MRA of head     showed signal loss within the right pericallosal artery suggesting     high-grade stenosis and/or occlusions and corresponding with     acute/subacute infarct.  High-grade stenosis in the proximal right     A2 segment may contribute.  Approximately 50% stenosis of the right     A segment.  Signal loss in the left A1 segment without focal     stenosis.  Probably stenosis of the distal right M1 segment at the     bifurcation.  Moderate stenosis of the superior left M2 segment     with more distal disease.  Moderate proximal stenosis of     the right P2 segment.  Diffuse small vessel disease. 3. MRI neck showed mild atherosclerotic irregularity of the carotid     bifurcation bilaterally without significant stenosis  by NASCET     criteria.  Anterograde flow in the vertebral arteries. 4. A 2-D echo negative for source of embolism.  No wall motion     abnormality.  Ejection fraction of 60% to 65%.  Wall thickening was     increasing with mild left ventricular hypertrophy.  Systolic     function was normal.  CONSULTANT:  Neurologist, Dr. Thad Ranger.  BRIEF HISTORY OF PRESENT ILLNESS:  This is a 60 year old African American, current smoker with a prior history of CVA, who presents complaining of dizziness since a month.  She was seen at [facility] and diagnosed with vertigo last week.  On Monday, she began to have a slurred speech and then over the last few days, she began to feel unsteady on her feet.  She has some difficulty moving her left lower extremity and left side. 1. Acute stroke.  Acute/subacute infarct involving the genu and mid-     body of the  right corpus callosum.  The patient was admitted to     telemetry.  No arrhythmia during this hospitalization.  She was     continued on Plavix.  Aspirin was added.  She was started also on     Lopid because her triglyceride was 431.  She will need repeated     liver function tests.  Neurologist was consulted, and they     recommended aspirin and Plavix and risk factor modification.  The     patient has multiple vessel disease and MRA of brain.  She had     carotid Dopplers that showed no evidence of stenosis bilaterally.     The patient will need PT/OT at home and speech therapy.  We will     continue with hydrochlorothiazide for blood pressure control.  We     will add low-dose Norvasc. 2. Hyperlipidemia.  The patient's LDL was unable to be calculated due     to high triglyceride at 431.  She was started on Lopid.  She will     need liver function tests to follow up. 3. Acute renal failure.  The patient with a BUN of 33 and creatinine     at 1.5 on admission.  With gentle hydration, her creatinine     decreased on the date of discharge at 1.24.  Metformin was     discontinued due to worsening renal function.  She will need to     follow with her primary care physician and consider restarting her     on metformin if creatinine less than 1.4.  Also, lisinopril was     stopped due to worsening renal function.  She will need to follow     with her primary care physician and consider restarting lisinopril     in 2 weeks if renal function is stable. 4. Hypertension.  We will continue with hydrochlorothiazide,     discontinue lisinopril in the setting of acute renal failure.     Norvasc low dose was added.  We will need to monitor. 5. Diabetes.  Her hemoglobin A1c was elevated at 9.7.  I will     discontinue metformin due to worsening renal function.  She will     need a BMET to follow up renal function prior to restarting     metformin.  The patient was started on Lantus 15 units  subcutaneous     at bedtime.  She will need to follow with her primary care  physician for further adjustments. 6. Urinary tract infection.  She finished 3 days of IV ceftriaxone. 7. Hypothyroidism.  Continue with levothyroxine.  On the day of discharge, the patient was in improved condition.  Blood pressure 140/72, sat 100% on room air, respiration 19, pulse 81, and temperature 98.3.  Labs; sodium 138, potassium 4.0, chloride 102, bicarb 28, BUN 24, creatinine 1.2, and glucose 162.  Hemoglobin 11.4, white blood cell 6.4.  The patient was discharged in improved condition.     Hartley Barefoot, MD     BR/MEDQ  D:  01/30/2011  T:  01/31/2011  Job:  [id number]  Electronically Signed by Hartley Barefoot MD on 02/07/2011 02:47:10 PM

## 2011-02-23 NOTE — H&P (Signed)
[name], [name]                ACCOUNT NO.:  [id number]  MEDICAL RECORD NO.:  [id number]           PATIENT TYPE:  E  LOCATION:  WLED                         FACILITY:  [facility]  PHYSICIAN:  [name] Maroon, MD        DATE OF BIRTH:  Dec 23, 1950  DATE OF ADMISSION:  01/27/2011 DATE OF DISCHARGE:                             HISTORY & PHYSICAL   CHIEF COMPLAINT:  "I might be stroking."  HISTORY OF PRESENT ILLNESS:  A 60 year old female with a history of CVA, apparently started about the first the month to complain of some dizziness.  She was seen at [facility] and diagnosed with vertigo last week.  On Monday, she began to have slurred speech and then over the last few days she began to feel unsteady on her feet, shaky and weak.  The patient had difficulty moving her left side and her daughter decided to bring her to the emergency room because of this.  The CT scan of the brain showed possible acute infarcts involving the body of the corpus callosum bilaterally, larger on the right.  Multiple sclerosis is also possibility.  Recommended pre and post contrast magnetic resonance imaging of the brain, old left caudate and basal ganglia lacunar infarcts, old subacute right basal ganglion lacunar infarct.  The patient will be admitted for workup of possible stroke.  PAST MEDICAL HISTORY: 1. CVA. 2. Diabetes type 2. 3. Hypertension. 4. Hyperlipidemia. 5. History of Graves disease, status post radioactive iodine ablation,     June 26, 2001, now with hypothyroidism. 6. GERD. 7. Peripheral vascular disease on ankle-brachial index, May 10, 2010.  PAST SURGICAL HISTORY:  C-section and tubal ligation.  SOCIAL HISTORY:  The patient smokes 1-pack per day x40 years.  She does not drink.  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  FAMILY HISTORY:  Mother died in her 6s of throat cancer.  Her grandmother has diabetes.  ALLERGIES:  No known drug allergies.  MEDICATIONS: 1. Plavix  75 mg p.o. daily. 2. Metformin 1000 mg p.o. b.i.d. 3. Levothyroxine 125 mcg p.o. daily. 4. Lisinopril/hydrochlorothiazide 10/12.5 mg p.o. daily.  REVIEW OF SYSTEMS:  Negative for all 10 organ systems except for pertinent positives stated above.  PHYSICAL EXAMINATION:  VITAL SIGNS:  Temperature afebrile, pulse 75, blood pressure is 134/79. HEENT:  Anicteric, EOMI, no nystagmus, pupils 1.5 mm, symmetric direct consensual near reflexes intact.  Mucous membranes moist. NECK:  No JVD.  Positive left carotid bruit. HEART:  Regular rate and rhythm.  S1 and S2.  No murmurs, gallops or rubs. LUNGS:  Clear to auscultation bilaterally. ABDOMEN:  Soft, nontender, nondistended.  Positive bowel sounds. EXTREMITIES:  No cyanosis, clubbing or edema. NEURO EXA,M:  Possible slight left facial droop.  Cranial nerves II-XII intact.  Reflexes 2+, symmetric, diffuse with upgoing toe on the left, downgoing toe on the right.  Motion is 5/5 in all 4 extremities, however, there is slight weakness especially the left leg.  LABORATORY DATA:  PTT 46.  PT 12.4.  Sodium 140, potassium 3.7, BUN 33, creatinine 1.45, AST 18, ALT 14, glucose 201.  WBC 7.0, hemoglobin 12.6, platelet count 235.  Cardiac markers, troponin-I less than 0.05. Urinalysis, wbc's 7-10.  ASSESSMENT/PLAN: 1. Dizziness, rule out cerebrovascular accident:  Check     hypercoagulable panel.  Check an MRI brain with and without     contrast/MRA.  Check carotid ultrasound, cardiac 2-D echo.     Continue on Plavix. 2. Hypertension, controlled:  Continue lisinopril and     hydrochlorothiazide. 3. Diabetes type 2.  Because of her renal insufficiency, the patient     will have metformin held and will use insulin sliding scale. 4. Hypothyroidism:  Continue levothyroxine. 5. Urinary tract infection.  Ceftriaxone 1 g IV daily. 6. Deep venous thrombosis prophylaxis.  SCDs.     [name] Maroon, MD     JYK/MEDQ  D:  01/27/2011  T:  01/28/2011  Job:   [id number]  cc:   [name] Fax: [phone]  [name], M.D. Fax: [phone]  Electronically Signed by [name] Grippe MD on 02/23/2011 01:49:29 AM

## 2011-02-25 ENCOUNTER — Encounter (HOSPITAL_COMMUNITY): Payer: Self-pay | Admitting: Radiology

## 2011-02-25 ENCOUNTER — Emergency Department (HOSPITAL_COMMUNITY): Payer: Medicaid Other

## 2011-02-25 ENCOUNTER — Emergency Department (HOSPITAL_COMMUNITY)
Admission: EM | Admit: 2011-02-25 | Discharge: 2011-02-25 | Disposition: A | Discharge status: Home / Self Care | Payer: Medicaid Other | Attending: Emergency Medicine | Admitting: Emergency Medicine

## 2011-02-25 DIAGNOSIS — N390 Urinary tract infection, site not specified: Secondary | ICD-10-CM | POA: Insufficient documentation

## 2011-02-25 DIAGNOSIS — R0989 Other specified symptoms and signs involving the circulatory and respiratory systems: Secondary | ICD-10-CM | POA: Insufficient documentation

## 2011-02-25 DIAGNOSIS — E119 Type 2 diabetes mellitus without complications: Secondary | ICD-10-CM | POA: Insufficient documentation

## 2011-02-25 DIAGNOSIS — I69921 Dysphasia following unspecified cerebrovascular disease: Secondary | ICD-10-CM | POA: Insufficient documentation

## 2011-02-25 DIAGNOSIS — R51 Headache: Secondary | ICD-10-CM | POA: Insufficient documentation

## 2011-02-25 DIAGNOSIS — I10 Essential (primary) hypertension: Secondary | ICD-10-CM | POA: Insufficient documentation

## 2011-02-25 DIAGNOSIS — I69922 Dysarthria following unspecified cerebrovascular disease: Secondary | ICD-10-CM | POA: Insufficient documentation

## 2011-02-25 DIAGNOSIS — E039 Hypothyroidism, unspecified: Secondary | ICD-10-CM | POA: Insufficient documentation

## 2011-02-25 DIAGNOSIS — I69998 Other sequelae following unspecified cerebrovascular disease: Secondary | ICD-10-CM | POA: Insufficient documentation

## 2011-02-25 DIAGNOSIS — R29898 Other symptoms and signs involving the musculoskeletal system: Secondary | ICD-10-CM | POA: Insufficient documentation

## 2011-02-25 HISTORY — DX: Cerebral infarction, unspecified: I63.9

## 2011-02-25 HISTORY — DX: Essential (primary) hypertension: I10

## 2011-02-25 LAB — DIFFERENTIAL
Eosinophils Absolute: 0.1 10*3/uL (ref 0.0–0.7)
Eosinophils Relative: 1 % (ref 0–5)
Lymphs Abs: 2.2 10*3/uL (ref 0.7–4.0)
Monocytes Absolute: 0.5 10*3/uL (ref 0.1–1.0)

## 2011-02-25 LAB — URINALYSIS, ROUTINE W REFLEX MICROSCOPIC
Bilirubin Urine: NEGATIVE
Ketones, ur: NEGATIVE mg/dL
Leukocytes, UA: NEGATIVE
Nitrite: NEGATIVE
Protein, ur: NEGATIVE mg/dL

## 2011-02-25 LAB — POCT I-STAT, CHEM 8
Creatinine, Ser: 1.3 mg/dL — ABNORMAL HIGH (ref 0.4–1.2)
Glucose, Bld: 235 mg/dL — ABNORMAL HIGH (ref 70–99)
HCT: 35 % — ABNORMAL LOW (ref 36.0–46.0)
Hemoglobin: 11.9 g/dL — ABNORMAL LOW (ref 12.0–15.0)
Sodium: 139 mEq/L (ref 135–145)
TCO2: 27 mmol/L (ref 0–100)

## 2011-02-25 LAB — BASIC METABOLIC PANEL
BUN: 28 mg/dL — ABNORMAL HIGH (ref 6–23)
Calcium: 10.4 mg/dL (ref 8.4–10.5)
Creatinine, Ser: 1.21 mg/dL — ABNORMAL HIGH (ref 0.4–1.2)
GFR calc non Af Amer: 46 mL/min — ABNORMAL LOW (ref >60)
Potassium: 4.3 mEq/L (ref 3.5–5.1)

## 2011-02-25 LAB — CBC
MCH: 29.1 pg (ref 26.0–34.0)
MCHC: 33.8 g/dL (ref 30.0–36.0)
MCV: 86.1 fL (ref 78.0–100.0)
Platelets: 296 10*3/uL (ref 150–400)
RDW: 14 % (ref 11.5–15.5)
WBC: 5.8 10*3/uL (ref 4.0–10.5)

## 2011-02-25 LAB — GLUCOSE, CAPILLARY: Glucose-Capillary: 287 mg/dL — ABNORMAL HIGH (ref 70–99)

## 2011-02-27 LAB — URINE CULTURE

## 2011-03-28 NOTE — H&P (Signed)
NAMEMILAYA, HORA                ACCOUNT NO.:  [id number]   MEDICAL RECORD NO.:  [id number]          PATIENT TYPE:  INP   LOCATION:  0104                         FACILITY:  [facility]   PHYSICIAN:  Ladell Pier, M.D.   DATE OF BIRTH:  09/18/1951   DATE OF ADMISSION:  06/24/2007  DATE OF DISCHARGE:                              HISTORY & PHYSICAL   CHIEF COMPLAINT:  Nausea, vomiting, diarrhea.   HISTORY OF PRESENT ILLNESS:  The patient is a 60 year old African  American female with past medical history significant for diabetes,  hypertension, dyslipidemia and history of stroke in the past.  The  patient does smoke cigarettes.  The patient came in today with 3-day  history of nausea, vomiting and diarrhea.  She does not remember eating  anything from a can; she did not eat out.  She stated that her stomach  started rumbling and then ever since then she has been going to the  bathroom frequently, everything seems to be coming out of her, with  abdominal pain.  She also has nausea and vomiting frequently.  She has  had some fevers and chills at home.  She has not started any new  medications.  She said this happened to her before.  She attributes  these symptoms to the increased dose of Glucophage.   PAST MEDICAL HISTORY:  Significant for:  1. Type 2 diabetes.  2. Tobacco use.  3. Hypertension.  4. Dyslipidemia.  5. History of CVA in the past with a little bit of gait difficulty      secondary to that.  6. She has had history of hyperthyroidism/Grave's disease in June      2002.  7. Status post radioiodine ablation, now resolved and hypothyroidism.   FAMILY HISTORY:  Mother is 80 and healthy.  Father is 35 and healthy.   SOCIAL HISTORY:  The patient is not married but she does have a  significant other.  She has not been working.  She has been trying to  get disability.  She smokes about one pack of cigarettes per day for  over 30 years.  She drinks alcohol occasionally.  She has  two children.   MEDICATIONS:  1. Plavix 75 mg daily.  2. Levothroid 100 mcg daily.  3. Glucophage 1000 mg twice daily.  4. Aspirin 81 mg daily.  5. Actos 45 mg daily.  6. Lisinopril 20 mg daily.  7. Hydrochlorothiazide 25 mg daily.   ALLERGIES:  None.   REVIEW OF SYSTEMS:  As per stated in HPI.   PHYSICAL EXAMINATION:  Temperature 97.8, blood pressure 191/56, pulse  102, respirations 18, pulse oximetry 95 on room air.  HEENT:  Head normocephalic, atraumatic.  Pupils equal and round,  reactive to light.  Throat without erythema.  CARDIOVASCULAR:  Regular rate and rhythm.  LUNGS:  Clear bilaterally.  ABDOMEN:  Positive bowel sounds, diffusely tender especially in the  epigastric area, but no guarding nor rebound.  EXTREMITIES:  Shows 2+ DP pulse bilaterally.   LABORATORIES:  CT scan shows small-bowel inflammation, question of  gastroenteritis versus ischemia.  WBC  12.4, hemoglobin 15.6, platelet  243.  Sodium 137, potassium 3.9, chloride 98, CO2 26, BUN 28, creatinine  2.16, glucose 256.  UA:  Small leukocyte esterase, 7-10 wbc's.   ASSESSMENT AND PLAN:  1. Nausea, vomiting, diarrhea:  This is most likely secondary to      gastroenteritis, more so than ischemia, taking into consideration      that she is having the nausea, the vomiting and the diarrhea.  Will      start her on Cipro 400 mg IV.  Will also check an LDH level.  Will      treat with Phenergan p.r.n. for the nausea.  Will get blood      cultures x2 and will observe her over the next 24 hours for any      improvement.  If no improvement, then we will revisit the      differential.  We will also order stool cultures, stool for C.      difficile toxin, O&P, wbc's.  2. Leukocytosis:  This is secondary to the infection.  Will monitor as      the patient is being treated.  3. Tobacco abuse:  Encourage smoking cessation.  4. Hypertension/hypotension:  She is now hypotensive.  We will replete      her with fluids, trying  to keep mean greater than 65, and will hold      her antihypertensive medications for now.  5. Diabetes:  Will hold the Glucophage and put her on Lantus and      sliding scale insulin.  Glucophage has in the past been known to      cause severe diarrhea, so will hold Glucophage and continue Lantus,      although most likely her diarrhea is from infection.  6. Hypothyroidism:  Will continue her on her medications and check her      TSH levels.  7. History of CVA:  Will continue her on Plavix.  8. Prophylaxis:  DVT prophylaxis with Lovenox, GI with Protonix.      Ladell Pier, M.D.  Electronically Signed     NJ/MEDQ  D:  06/24/2007  T:  06/25/2007  Job:  [id number]   cc:   Dala Dock

## 2011-03-31 NOTE — Op Note (Signed)
   NAME:  [name], [name]                          ACCOUNT NO.:  [id number]   MEDICAL RECORD NO.:  [id number]                   PATIENT TYPE:  AMB   LOCATION:  ENDO                                 FACILITY:  [facility]   PHYSICIAN:  Graylin Shiver, M.D.                DATE OF BIRTH:  1951-02-25   DATE OF PROCEDURE:  06/26/2003  DATE OF DISCHARGE:                                 OPERATIVE REPORT   PROCEDURE:  Colonoscopy.   INDICATIONS FOR PROCEDURE:  Heme positive stool.   CONSENT:  Informed consent was obtained after explanation of the risks of  bleeding, infection, and perforation.   PREMEDICATION:  The procedure was done immediately after an EGD with an  additional 20 mcg of Fentanyl given IV as well as Versed 2 mg IV.   PROCEDURE IN DETAIL:  With the patient in the left lateral decubitus  position, a rectal exam was performed and no masses were felt.  The Olympus  colonoscope was inserted into the rectum and advanced around the colon to  the cecum.  Cecal landmarks were identified.  The ileocecal valve appeared  prominent, biopsies were obtained, it was soft, it did not give the feel of  a tumor but because it was prominent, biopsies were obtained for  histological inspection.  The rest of the cecum looked normal  The ascending  colon looked normal.  The transverse colon looked normal.  The descending  colon, sigmoid, and rectum was normal.  She tolerated the procedure well  without complications.   IMPRESSION:  Prominent ileocecal valve which was biopsied.   PLAN:  The biopsies will be checked.                                               Graylin Shiver, M.D.    SFG/MEDQ  D:  06/26/2003  T:  06/26/2003  Job:  [id number]   cc:   Sibyl Parr. Fields, M.D.  [address] [place]  [place] [address]  Fax: [phone]

## 2011-03-31 NOTE — Discharge Summary (Signed)
[name], [name]                ACCOUNT NO.:  [id number]   MEDICAL RECORD NO.:  [id number]          PATIENT TYPE:  INP   LOCATION:  1401                         FACILITY:  [facility]   PHYSICIAN:  Wilson Singer, M.D.DATE OF BIRTH:  1951-08-23   DATE OF ADMISSION:  06/24/2007  DATE OF DISCHARGE:  06/27/2007                               DISCHARGE SUMMARY   FINAL DISCHARGE DIAGNOSES:  1. Urinary tract infection.  2. Diabetes mellitus.  3. Anemia of chronic disease.   CONDITION ON DISCHARGE:  Stable.   MEDICATIONS ON DISCHARGE:  1. Plavix 75 mg daily.  2. Levothroid 100 mcg daily.  3. Glucophage 1 g b.i.d.  4. Aspirin 81 mg daily.  5. Actos 45 mg daily.  6. Ciprofloxacin 5 mg b.i.d. x4 days.   HISTORY:  This 60 year old lady was admitted with nausea and vomiting  and diarrhea for 3 days as well as abdominal pain.  Please see initial  history and physical examination done by Dr. Olena Leatherwood.   HOSPITAL PROGRESS:  The patient was admitted, and actually it was  suggestive of a urinary tract infection, so she was treated with  antibiotics.  Also, a CT scan of the abdomen was suggesting ascites, but  when she was sent down to have the ascites tapped, there was no ascites  actually seen.  By this time, of course, she was feeling much better  with intravenous antibiotics for her urinary tract infection, and this  was converted to oral antibiotics.  On the day of discharge, she was  doing well with no abdominal pain, nausea, or vomiting.   PHYSICAL EXAMINATION:  Temperature 98, blood pressure 148/65, pulse 52,  saturation 100% on room air.   Investigations showed a sodium of 140, potassium 3.9, bicarb __________  , BUN 8, creatinine 1.16, hemoglobin 9.1, white blood cell count 5.8,  platelets 192.   FURTHER DISPOSITION:  She will follow up with Dr. Audria Nine, her primary  care physician, after completing her course for the __________ urinary  tract infection which she had.  Also, I  would suggest he may want to  investigate her anemia, although I suspect this is probably one of  chronic disease.     Wilson Singer, M.D.  Electronically Signed    NCG/MEDQ  D:  07/12/2007  T:  07/13/2007  Job:  [id number]

## 2011-03-31 NOTE — H&P (Signed)
NAME:  [name], [name]                          ACCOUNT NO.:  [id number]   MEDICAL RECORD NO.:  [id number]                   PATIENT TYPE:  INP   LOCATION:  3706                                 FACILITY:  [facility]   PHYSICIAN:  [name], M.D.           DATE OF BIRTH:  June 17, 1951   DATE OF ADMISSION:  07/22/2004  DATE OF DISCHARGE:  07/25/2004                                HISTORY & PHYSICAL   HISTORY OF PRESENT ILLNESS:  [name] is a 60 year old African-American  female who presented to the [facility] emergency room via EMS for a  complaint of chest pain.  Pain initially began four days prior to admission  while at rest.  It was initially a 4/10 in severity earlier today and  decreased to a 1/10 after she was given a sublingual nitroglycerin x2 and  three baby aspirin by EMS.  She describes it as left-sided, radiating to the  neck, associated with diaphoresis but not shortness of breath.  She  describes it as a constant aching and pressure like a heavy object on her  chest.  No prior history of coronary disease or MI.  She does not feel that  this chest pain is the same as the symptoms she has had with gastritis in  the past.   REVIEW OF SYSTEMS:  She has been feeling swimmy-headed.  No fevers or  chills.  Review of systems is also notable for a dry cough at night,  constipation on and off, tingling in her fingertips nightly for the last  four days, and chronic headache.  She also feels that her thyroid is swollen  and has a history of chronic hypothyroidism.   PAST MEDICAL HISTORY:  1.  Type 2 diabetes.  2.  Tobacco abuse.  3.  Hypertension.  4.  Hypothyroidism.  5.  Hypercholesterolemia.  6.  CVA.  7.  Balance and gait difficulty secondary to CVA.  8.  Radioactive iodine treatment for Grave's disease June 2002.   FAMILY HISTORY:  Both grandmothers have diabetes.  No history of coronary  disease.   ALLERGIES:  No known drug allergies.   MEDICATIONS:  1.   Aspirin 81 mg daily.  2.  Avandia 8 mg daily.  3.  Colace 100 mg once or twice daily as needed.  4.  Glucophage 1000 mg b.i.d.  5.  Hyzaar 100/25 mg daily.  6.  Levoxyl 150 mcg daily.  7.  Lipitor 20 mg daily.  8.  Plavix 75 mg daily.  9.  Prevacid on and off.   Of note, she has not been able to afford any of her medications in the last  several months.   SOCIAL HISTORY:  She has smoked one pack per day for the last 28 years.  Occasional alcohol, no drugs.  She has two children and has a boyfriend.  She previously worked at [employer] last year but is no  longer working  secondary to chronic falls and back pain after her CVA.   PHYSICAL EXAMINATION:  VITAL SIGNS:  Blood pressure in the 170s-180s/70s-  90s, pulse 78, respirations 20, O2 saturation 99% on room air, afebrile.  GENERAL:  She is alert and oriented x3, in no apparent distress.  HEENT:  Remarkable only for poor dentition.  NECK:  No significant thyroid abnormality and is otherwise unremarkable.  CHEST:  Lung exam shows bibasilar crackles.  CARDIOVASCULAR:  Regular rate and rhythm, no murmurs, rubs, or gallops.  EXTREMITIES:  No edema.  ABDOMEN:  Unremarkable.  NEUROLOGIC:  5/5 strength throughout, light touch sensation intact  throughout, cranial nerves grossly intact.  RECTAL:  Heme-negative brown stool, no masses or hemorrhoids.   LABORATORY DATA AND STUDIES:  Portable chest x-ray showed no acute disease.  EKG on admission showed normal sinus rhythm with a rate in the 70s, with  first degree AV block, concave J-point elevation in leads V1-V2.  Flat or  flipped T-waves in leads II, III, and aVF, and V4-V6.  No old EKGs to  compare.  Electrolytes on admission were remarkable for potassium at 3.0.  LFTs were unremarkable.  INR was 0.9.  Point of care enzymes showed  myoglobin elevated at 350, CK-MB elevated at 9.3, and troponin less than  0.05.  Fingerstick blood sugar was 118.   ASSESSMENT AND PLAN:  A 60 year old  African-American female, well-known to  our service, here with atypical chest pain.   1.  Chest pain.  We will admit her to telemetry with oxygen by nasal      cannula, aspirin, and sublingual nitroglycerin and a proton pump      inhibitor.  Will start a heparin drip given her elevated CK-MB and      myoglobin and questionable EKG findings to empirically treat her for      coronary disease.  Will cycle enzymes and discontinue the drip if she      rules out for myocardial infarction.  Will go ahead and consult      cardiology in the morning as she will likely benefit from risk      stratification while she is an inpatient.  She has a long term history      of noncompliance, and I doubt she will follow up for any stress testing      as an outpatient.  She will be most appropriate for Cardiolite testing,      as her balance problems will keep her from doing well on a treadmill.      We will also start atenolol for empiric treatment of coronary artery      disease.  2.  Hypertension.  Restart her medications and watch.  3.  Diabetes type 2.  Restart Avandia, hold Glucophage, and will start      sliding scale insulin.  4.  Hypothyroidism.  Check thyroid-stimulating hormone and restart Levoxyl      and adjust dose as appropriate.  5.  Smoking.  Counseled cessation.  6.  Social.  Will request a case management consult for medication      assistance.  As the patient is not working, she may now qualify for      Medicaid.       NCR/MEDQ  D:  07/31/2004  T:  08/01/2004  Job:  [id number]   cc:   Lawerance Sabal, MD  Fax: [phone]

## 2011-03-31 NOTE — Discharge Summary (Signed)
[place]  Patient:    [name], [name]                       MRN: [id number] Adm. Date:  [id number] Disc. Date: [id number] Attending:  Tobin Chad Dictator:   Harrold Donath, M.D. CC:         Delrae Rend, M.D.   Discharge Summary  CONSULTS:  Cardiology, Delrae Rend, M.D.  DISCHARGE DIAGNOSES: 1. Noncardiac chest pain. 2. Hyperthyroidism, Graves syndrome. 3. Right upper quadrant pain. 4. Hypertension.  DISCHARGE MEDICATIONS: 1. Glucotrol 5 mg b.i.d. 2. Captopril 25 mg b.i.d. 3. Lopressor 50 mg t.i.d. 4. Aspirin 325 mg q.d.  PROCEDURES: 1. A HIDA scan was done on June 13, 2001, and the preliminary diagnosis is    normal findings. 2. A right upper quadrant ultrasound was done on June 12, 2001, with no    significant findings.  ADMISSION HISTORY:  The patient is a 60 year old African-American female who was brought to the emergency room at [facility]. [facility] for palpitations and burning chest pain with radiation to the left shoulder, arm, and left side of the neck.  HOSPITAL COURSE: #1 - CHEST PAIN:  In the emergency room, the patient was given a GI cocktail and Pepcid, which somewhat relieved her pain.  An EKG was done at that time, which showed sinus tachycardia and voltage criteria for LVH, no ischemia, and good R-wave progression.  The patient ruled out for MI by enzymes.  The patient was placed on an aspirin once a day and had no further complaints of chest pain throughout her hospital course.  #2 - HYPERTHYROIDISM:  The patient had been diagnosed with Graves disease and while in house had elevated T3 and T4 and decreased TSH.  The patient was scheduled for a thyroid ablation on June 26, 2001.  #3 - RIGHT UPPER QUADRANT PAIN:  The patient presented with some right upper quadrant pain on admission and following the GI cocktail had no further complaints of pain.  The right upper quadrant ultrasound  was done, which was negative.  However, due to increased LFTs while in house, a HIDA scan was done and the preliminary results normal.  The patients AST was found to be 107 and ALT 65.  The bilirubin was increased at 2.4.  It is likely that the chest pain and abdominal pain are of the same GI etiology, especially due to the fact that they were both resolved with the GI cocktail.  It is likely that her hyperthyroid could have made the episode worse.  #4 - HYPERTENSION:  The patient had elevated blood pressures throughout her hospital stay with the systolic blood pressure trending in the 160s-180s.  The patient was initially placed on Lopressor 25 mg q.d. and was then increased to Lopressor 50 mg b.i.d. and finally increased to Lopressor 50 mg t.i.d.  Altace 2.5 mg q.d. was also added to the patients medical regimen.  On discharge, the patient expressed her disagreement with Altace and was then changed to Captopril on discharge.  The patient was instructed about her new medical regimen.  She was also informed to call the family practice center the morning after discharge to make an appointment with her primary care physician, Dr. Merilynn Finland.  She was also informed that she has three separate cardiology appointments, one for an exercise Cardiolite, one for lower extremity Doppler, and one to meet with the cardiologist, Delrae Rend, M.D.  The  patient was then discharged to home in stable condition with her sister. D:  06/13/01 TD:  06/14/01 Job: [id number]

## 2011-03-31 NOTE — H&P (Signed)
[name]  Patient:    [name], [name]                      MRN: [id number] Adm. Date:  06/11/01 Attending:  Deniece Portela A. Sheffield Slider, M.D. Dictator:   Gwenlyn Perking, M.D. CC:         Lucille Passy, M.D.   History and Physical  PRIMARY CARE PHYSICIAN:  Lucille Passy, M.D.  ADMISSION PROBLEM LIST: 1. Chest pain with multiple coronary artery disease risk factors, including    diabetes mellitus, hypertension, family history, and smoking. 2. Tachycardia. 3. Hyperthyroidism. 4. Transaminitis with increase of AST and ALT. 5. Medical noncompliance. 6. Diabetes mellitus. 7. Hypertension.  CHIEF COMPLAINT:  Chest pain.  HISTORY OF PRESENT ILLNESS:  Patient is a 60 year old female who woke up this morning with palpitations.  She got up and experienced burning chest pain that radiated to her left shoulder and arm and her left neck.  This was unrelieved with two Tums.  This lasted about two hours.  Brought to the emergency room by family, and the pain was relieved with a GI cocktail and Pepcid to some degree, however not completely.  Patient states that when she gets up, that her "heart races."  REVIEW OF SYSTEMS:  CONSTITUTIONAL:  Weight loss of about 30 pounds in the last few months.  CARDIOVASCULAR:  Positive for pedal edema.  RESPIRATORY: Positive for cough, negative for dyspnea on exertion.  GASTROINTESTINAL: Decreased appetite, occasional constipation, no melena, no bright red blood per rectum.  SKIN:  Positive itching and dryness.  NEUROLOGIC:  Negative for numbness or tingling in feet.  PSYCHIATRIC:  Normal mood.  Negative for excessive thirst.  MUSCULOSKELETAL:  Positive neck and hip pain.  Some morning stiffness.  ENT:  Positive for rhinorrhea.  GENITOURINARY:  Negative dysuria. HEMATOLOGIC:  Negative for a history of anemia.  ENDOCRINOLOGIC:  History of Graves disease, including shakiness and losing hair.  EYES:  The patient  does not have annual ophthalmological exams.  PAST MEDICAL HISTORY:  Significant for hyperthyroid, type 2 diabetes mellitus, smoking, tobacco abuse, hypertension, proteinuria.  Other past medical history:  Hyperthyroidism secondary to Graves disease.  MEDICATIONS:  Aspirin 81 mg p.o. q.d., Glucovance 500/5 mg b.i.d., Lotensin/ hydrochlorothiazide 20/12.5 mg q.d. and was held in June 2000, _____ 25 mg p.o. q.d. for hyperthyroidism.  ALLERGIES:  No known drug allergies.  SOCIAL HISTORY:  Lives with brother.  Smokes one pack per day for the last 28 years.  One cup of coffee in the morning.  Works at [employer].  Drinks occasional alcohol.  No drugs.  Has two children.  Not married.  Noncompliant with medicine.  PAST SURGICAL HISTORY:  Significant for tubal ligation.  FAMILY HISTORY:  Diabetes mellitus in both grandmothers.  Mother had throat cancer.  Maternal grandmother with coronary artery disease, diabetes mellitus, hypertension.  Brother died of MI.  PHYSICAL EXAMINATION:  VITAL SIGNS:  Temperature 97.8, blood pressure 167/52, pulse 108, respiratory rate 20.  O2 saturation is 98%.  GENERAL:  Well-developed, well-nourished, talkative.  Wants to go home.  In no apparent distress.  Alert and oriented x 4.  HEENT:  Normocephalic, atraumatic.  Pupils equal, round and reactive to light and accommodation, extraocular muscles intact.  Ears, nose, and throat grossly within normal limits.  NECK:  Thyroid with general fullness, no palpable nodule.  CHEST:  Clear to auscultation bilaterally.  CARDIAC:  Tachycardic, regular.  No murmurs, rubs, or  gallops.  PMI is palpated, enlarged.  BACK:  No CVA tenderness. ABDOMEN:  Right upper quadrant pain to deep palpation, plus/minus Murphys sign.  Liver and spleen are not palpable below the costal margin.  MUSCULOSKELETAL:  Within normal limits.  SKIN:  Normal to inspection.  NEUROLOGIC:  Cranial nerves II-XII grossly intact.  No  focal sensory or motor deficits.  LABORATORY DATA:  H&H 11.2, 32.7, white blood cell count 7.8, platelets 277. Sodium 144, potassium 4.8, chloride 101, CO2 28, BUN 13, creatinine 0.5, glucose 76.  Total bilirubin 2.4, AST is 107, ALT 65, albumin 3.9, calcium 11.2.  Lipase 19.  EKG:  Sinus tachycardia at 111, normal axes.  Voltage criteria for LVH.  No ectopy.  Good R-wave progression, no ischemia.  ASSESSMENT: 74. A 60 year old African-American female with chest pain and multiple coronary    artery disease risk factors, including diabetes mellitus, hypertension,    family history, and smoking.  This is probably gastrointestinal-related,    given the relief with the GI cocktail.  The chest pain furthermore is not    typical by history, negative enzymes so far, and an EKG does not reveal any    signs of ischemia or injury.  However, given multiple historical risk    factors, will admit patient to rule out myocardial infarction.  Will    further more get a hemoglobin A1c to assess her diabetic control and may    further check a right upper quadrant ultrasound to rule out any    hepatobiliary disease or cholecystitis. 2. Tachycardia secondary to recent thyroid iodine treatment.  Will check free    T3, T4.  Patient does not take _____ because it makes her shakier.  She    does have symptoms of hyperthyroidism at the current time. 3. Transaminitis with increased AST, ALT, possibly secondary to Glucovance.    Will hold this medicine for now.  The ratio is also suspicious for recent    alcohol use, but this is denied by the patient.  Will further investigate    as outlined above with a right upper quadrant ultrasound to rule out    cholecystitis or cholelithiasis. 4. Medical noncompliance.  Will educate patient and will ask family to bring    in the medicines to verify what medicines she is currently taking. 5. Diabetes mellitus.  Unsure of control.  Will check a hemoglobin A1c and    follow  CBGs.  Will need to alternate p.o. agent.  6. Hypertension.  Will follow.  No current medications. DD:  06/11/01 TD:  06/12/01 Job: [id number]

## 2011-03-31 NOTE — Discharge Summary (Signed)
NAME:  [name], [name]                          ACCOUNT NO.:  [id number]   MEDICAL RECORD NO.:  [id number]                   PATIENT TYPE:  INP   LOCATION:  3023                                 FACILITY:  [facility]   PHYSICIAN:  Leighton Roach McDiarmid, M.D.             DATE OF BIRTH:  [birth year]   DATE OF ADMISSION:  02/26/2003  DATE OF DISCHARGE:  02/28/2003                                 DISCHARGE SUMMARY   PRIMARY CARE PHYSICIAN:  [name], M.D., [facility].   DISCHARGE DIAGNOSES:  1. Transient ischemic attack versus worsening cerebrovascular accident.  2. Hypothyroidism.  3. Hypertension.  4. Diabetes mellitus type 2.  5. Tobacco abuse.  6. Hypercholesterolemia.   CONSULTATIONS:  Speech pathology.   MEDICATIONS:  1. Plavix 75 mg p.o. daily.  2. Lisinopril 10 mg p.o. daily.  3. Hydrochlorothiazide 12.5 mg p.o. daily.  4. Lipitor 20 mg p.o. daily.  5. Synthroid 100 mcg daily.   ACTIVITY:  The patient is to increase her activity as tolerated.  She is to  begin a home health physical therapy and occupational therapy and will  advance to outpatient occupational therapy for dexterity and coordination  purposes for her job as tolerated.   DIET:  Patient is to resume diabetic diet.   DISCHARGE INSTRUCTIONS:  Patient encouraged and educated about need to quit  smoking.   FOLLOW UP:  Patient needs to follow up with Dr. [name] in  approximately one to two weeks for hospital follow up.   HISTORY OF PRESENT ILLNESS:  For full details, please see dictated history  and physical examination in the chart.  Briefly, this is a 60 year old  African-American female who prior to the night of admission started having  some slurred speech and trouble walking.  She said she felt like she was  drunk.  She said her tongue felt thick and it was difficult to move.  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  This had all  started with no prodromal symptoms and she had had a similar  episode like this where her equilibrium was off approximately three years  ago.   REVIEW OF SYMPTOMS:  No chest pain, no shortness of breath, no palpitations,  no blurred vision or double vision, no other noticeable weaknesses.  For  full details please see dictated history and physical examination.   HOSPITAL COURSE:  #1:  LIKELY CEREBROVASCULAR ACCIDENT:  CT scan of the head  revealed no acute infarcts.  She has one old and one possible subacute  versus chronic infarct in the left basal ganglia.  Neither one of them  appears acute, however, one is new since May of 2001.  No signs of  intracranial bleed.  The patient also had an echocardiography obtained and  preliminary showed no embolic sources but full echocardiography report is  still pending at  this time.  Electrocardiogram revealed normal sinus rhythm.  Carotid Dopplers showed no significant internal carotid artery stenosis.  Probability that this patient had likely a transient ischemic attack,  worsening of her previous stroke versus a subacute stroke secondary to poor  control of her other risk factors, namely her hypertension, smoking,  diabetes.  The patient was currently taking an aspirin and was not on a  statin although she had a history of hypercholesterolemia.  We started  patient on Plavix but discontinued aspirin secondary to no significant  benefits of combination therapy and increased risk of bleed.  The patient  was also started on Lipitor secondary to hypercholesterolemia which was  noted in the hospital visit to be elevated at LDL 107, HDL 37, triglycerides  190, cholesterol 182 and recommended exercise and diet to lower the  triglycerides but warrants repeat in outpatient clinic for possibility of  adding second therapy for hypertriglyceridemia.   #2:  DIABETES MELLITUS:  Hemoglobin A1C was 7.9 and patient was currently  trying to manage with diet and  exercise.  The patient was seen by the  diabetic coordinator and issued a Glucometer for home.  Although the patient  did not have perfect control concern over maintaining the patient on  diabetic diet, other risk factors included trying to convince the patient to  quit smoking as well as maintaining her blood pressures.  It appears that  her diabetes is under fair control with diet and exercise.  We will need to  monitor her very closely.  Her A1C is slightly elevated at this point in  time.  Patient was able to be covered with sliding scale insulin during her  hospital course recommended diabetes coordinator see the patient during  hospital visit and reinforced nature of diabetic diet and issued the patient  a Glucometer for home Accu-Chek's.  Patient is told of resources for  diabetic outpatient education and patient denied at this point in time.   #3: HYPERTENSION:  The patient's blood pressure was within normal limits in  the initial phase and she was started on hydrochlorothiazide and ACE per  JNC7 guidelines.  The patient did not have significant elevation of her  blood pressure on admission and was 146/86.  Secondary to this not being a  new acute stroke but no hemorrhagic bleeds the patient was able to be  restarted on blood pressure medications including the hydrochlorothiazide  and ACE inhibitor for post stroke secondary risk reduction.  Patient will  need follow up of her blood pressure and tight management as far as for  secondary risk reduction.   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 The patient was  agreeable to this plan and was discharged with home health physical therapy  and occupational therapy set up.   #4: HYPOTHYROIDISM:  The patient had thyroid check which was noted to be  1.475 which is normal range.  The patient will continue taking her  Synthroid  at 100 mcg per day.   #5: TOBACCO ABUSE:  Patient continues to smoke and denies quitting at this  point in time.  The patient says she is concerned over weight gain  possibilities when she quits smoking and does not see the benefits of  quitting smoking at this point in time despite being told this would elevate  her risk for possibility of a repeat and worsened stroke.  The patient  understood and agreed and agreed if she had desire to quit she would seek  help from her primary care physician.   The patient was discharged on February 28, 2003 in stable and improved  condition and will follow up with Merilynn Finland as noted above in approximately  one to two weeks for hospital follow up.  The patient will get home health  physical therapy starting on March 02, 2003 until able to transfer out to  outpatient care and further occupational therapy.     Alvira Philips, M.D.                      Etta Grandchild, M.D.    RM/MEDQ  D:  02/28/2003  T:  03/01/2003  Job:  [id number]

## 2011-03-31 NOTE — Op Note (Signed)
   NAME:  [name], [name]                          ACCOUNT NO.:  [id number]   MEDICAL RECORD NO.:  [id number]                   PATIENT TYPE:  AMB   LOCATION:  ENDO                                 FACILITY:  [facility]   PHYSICIAN:  Graylin Shiver, M.D.                DATE OF BIRTH:  06-Jul-1951   DATE OF PROCEDURE:  06/26/2003  DATE OF DISCHARGE:                                 OPERATIVE REPORT   PROCEDURE(S):  Esophagogastroduodenoscopy with biopsy for CLOtest.   INDICATIONS FOR PROCEDURE:  Heartburn, heme-positive stool. Informed consent  was obtained after explanation of the risks of bleeding, infection and  perforation.   PREMEDICATIONS:  Fentanyl 40 mcg IV; Versed 4 mg IV.   PROCEDURE:  With the patient in the left lateral decubitus position, the  Olympus gastroscope was inserted into the oropharynx and passed into the  esophagus.  It was advanced down the esophagus and then into the stomach and  into the duodenum.  The second portion and bulb of the duodenum looked  normal.  The stomach showed an erythematous appearance to the mucosa  compatible with gastritis.  Biopsy for CLOtest was obtained.  No ulcers were  seen.  The fundus and cardia looked normal on retroflexion of the  gastroscope.  The scope was then straightened and brought into the  esophagus.  The esophagogastric junction was at 38 cm.  The esophageal  mucosa and the esophagogastric junction looked normal.  She tolerated the  procedure well without complications.   IMPRESSION:  Gastritis.   PLAN:  The CLOtest will be checked.  The patient would probably benefit from  a PPI for her heartburn.  The patient will follow up with Dr. Enid Baas at  [facility].                                               Graylin Shiver, M.D.    SFG/MEDQ  D:  06/26/2003  T:  06/26/2003  Job:  [id number]   cc:   Sibyl Parr. Fields, M.D.  [address] [place]  [place] [address]  Fax: [phone]

## 2011-03-31 NOTE — Consult Note (Signed)
NAME:  [name], [name]                          ACCOUNT NO.:  [id number]   MEDICAL RECORD NO.:  [id number]                   PATIENT TYPE:  INP   LOCATION:  3706                                 FACILITY:  [facility]   PHYSICIAN:  Vida Roller, M.D.                DATE OF BIRTH:  05-17-1951   DATE OF CONSULTATION:  07/23/2004  DATE OF DISCHARGE:                                   CONSULTATION   PRIMARY:  Dr. Evonnie Pat.  She is on the hospitalist service under the care of  Dr. [name].   She has no cardiologist.   HISTORY OF PRESENT ILLNESS:  [name] is a 60 year old African-American  woman who lives here in [place] who has a history of a previous admission  a couple of years ago for chest discomfort who now presents with another  episode of discomfort in her chest.  She said it started on Labor Day, waxed  and waned over a period of 3-4 days.  She was admitted over night last night  with progressive discomfort in her chest.  She was concerned that she might  be also having another stroke, as she has a history of CVAs in the past.  She describes the discomfort as central chest heaviness which waxes and  wanes.  There is no exercise component to it, there is no PND or orthopnea,  there is no lower extremity edema.  She has no palpitations.  There is no  radiation to the discomfort.  It is not too different from her previous  admission.  She essentially ruled out by enzymes for myocardial infarction  and we were asked to consult for evaluation for further medication  management and assessment.   PAST MEDICAL HISTORY:  Is Significant for:  1.  Diabetes mellitus, which is poorly controlled.  2.  Hypertension, which is poorly controlled.  3.  Unknown lipid status.  4.  Ongoing tobacco abuse.  5.  History of CVA in the past.  She had a CT scan on her last admission      which showed old infarcts but nothing acute, she is on Plavix for that.  6.  She has a history of guaiac  positive stool status post an EGD and a      colonoscopy on her last admission which was unremarkable except for some      gastritis, she is not treated for that.  7.  She has chronic anemia.   She has not had any surgeries.  She has had one child who is 31 and healthy.  She does smoke cigarettes, about a pack a day, and has since the age of 89.  She does not use any illicit drugs or drink any alcohol.  She is currently  unemployed and is unable to purchase her medications due to insufficient  funds.   REVIEW OF SYSTEMS:  Is generally negative, except for that  reviewed in  history of present illness.   SHE IS ALLERGIC TO NO MEDICATIONS.   She is on the following medications:  1.  Atenolol 50 mg once daily.  2.  Aspirin 325 mg once daily.  3.  Plavix 75 mg once daily.  4.  Levothyroxine 150 mcg once daily.  5.  Colace 100 mg twice daily.  6.  She is on insulin on a sliding scale.  7.  Protonix 40 mg once daily.  8.  K-Dur 40 mg twice daily.  9.  Avandia 8 mg daily.  10. Zocor 40 mg at night.   FAMILY HISTORY:  Unremarkable.   On physical exam, she is a well-developed, well-nourished black female  resting comfortably in bed without any discomfort in her chest.  Her pulse  is 59, her respiratory rate 16, her blood pressure is 157/73.  She is  saturating 100% on room air.  HEENT:  Unremarkable.  NECK:  Supple.  There is no jugular venous distention or carotid bruits.  Her thyroid is normal size, in the midline.  CHEST:  Clear to auscultation bilaterally.  CARDIAC EXAM:  Is regular.  There is no displacement to her point of maximal  impulse.  Her first and second heart sounds are normal, there is no murmur  noted.  ABDOMEN:  Soft, nontender with no hepatosplenomegaly.  LOWER EXTREMITIES:  Are without significant clubbing, cyanosis or edema.  Her pulses are 1+ throughout.   Electrocardiogram shows sinus bradycardia, rate of 55 with first degree AV  block, PR interval of 231  msec.  She has a QRS duration of 78 msec, QT is  corrected at 449.  Her __________ is normal.  She has nonspecific ST-T wave  changes throughout her precordium, in her inferior and lateral leads.   LABORATORIES:  White blood cell count 7.9, H&H of 11 and 31, platelet count  200, sodium 137, her potassium is 3.5, chloride 107, bicarb 25, BUN 14,  creatinine 1.0, blood sugar 203.  Liver function studies were all within  normal limits.  She has two sets of cardiac enzymes, both of which have  elevated CKs.  The totals are 1200 and 1600 but the MBs are normal and her  troponins are also normal.   So, we have a lady who has:  1.  Chest discomfort which is atypical, but she does have multiple cardiac      risk factors, no pain now.  Cardiac enzymes are inconsistent with acute      myocardial infarction and her EKG is nonischemic.  2.  She has diabetes which is poorly controlled, with no obvious end organ      damage, but she is noncompliant.  3.  Hypertension is poorly controlled, she is noncompliant.  4.  She has ongoing tobacco abuse.  5.  She has unknown lipid status, she is on Zocor.  6.  She has a history of a cerebrovascular accident, on Plavix, with CT scan      showing old infarcts.  7.  She has a history of guaiac positive stools and anemia.   My recommendations are that there should be fasting lipids checked.  She can  have an exercise Cardiolite in the morning.  She needs better blood pressure  control and I would recommend adding an ACE inhibitor, as she is diabetic  and hypertensive and also has a significant history of peripheral vascular  disease.  I will follow her along with you.  Vida Roller, M.D.    JH/MEDQ  D:  07/23/2004  T:  07/24/2004  Job:  [id number]

## 2011-03-31 NOTE — Discharge Summary (Signed)
NAME:  [name], [name]                          ACCOUNT NO.:  [id number]   MEDICAL RECORD NO.:  [id number]                   PATIENT TYPE:  INP   LOCATION:  3706                                 FACILITY:  [facility]   PHYSICIAN:  Sharin Grave, MD               DATE OF BIRTH:  07-16-1951   DATE OF ADMISSION:  07/22/2004  DATE OF DISCHARGE:  07/25/2004                                 DISCHARGE SUMMARY   ATTENDING OF RECORD:  Asencion Partridge, M.D.   DISCHARGE DIAGNOSES:  1.  Atypical chest pain.  2.  Hyperthyroidism.  3.  Type 2 diabetes mellitus.  4.  Hypertension.  5.  Residual deficit from past cerebrovascular accident.  6.  Tobacco abuse.  7.  Poor compliance with medical treatment.   DISCHARGE MEDICATIONS:  1.  Aspirin 325 mg daily.  2.  Plavix 75 mg daily with food.  3.  Enalapril/hydrochlorothiazide 5/12.5 mg one tablet daily.  4.  Levothyroxine/Synthroid 88 mcg daily.  5.  Avandia 8 mg daily.  6.  Lipitor 20 mg daily.   BRIEF HOSPITAL COURSE:  [name] is a 60 year old African American female  who was admitted with a chief complaint of three to four days with chest  discomfort and what she describes as heaviness, which waxes and wanes. She  also describes burning in character with no relation to movement or  exertion. Her review of systems was only positive for chest discomfort. Her  physical exam was pretty much unremarkable and the patient was found to have  a heart rate of 59 during admission and her blood pressure was 157/73. She  had an electrocardiogram that showed sinus bradycardia with a rate of 55  with first degree AV block. Her admission labs revealed a white blood cell  count of 7.9, hemoglobin 11, hematocrit 31, platelet count 200,000. Sodium  was 137, potassium 3.5, chloride 107, bicarbonate 25, BUN 14, creatinine 1.  Blood sugar was 203. Liver function tests were within normal limits.   PROBLEM #1:  Cardiovascular.  Atypical chest pain. The patient had  serial  cardiac enzymes that were inconsistent with acute myocardial infarction. She  also had an EKG that showed nonischemic condition. Several sets of cardiac  enzymes showed elevated CKs, but normal MB fraction and normal troponin  levels who helped to rule out myocardial infarction.  The patient also had a  nuclear stress test on July 24, 2004, that had evidence of induced  myocardial ischemia or myocardial scar. It showed normal left ventricular  wall motion study and calculated left ventricular ejection fraction of 67%.  The patient was consulted with cardiologist, Dr. Herbie Baltimore, who suggested to  add an ACE inhibitor to the patient's regimen. The patient received one dose  of nitroglycerin in the ED and also was treated with Protonix, magnesium  hydroxide, and Tylenol. She had complete resolution of chest pain the second  day of admission.  She also has poor control her hypertension and she had a  systolic value between 130 to 045 and diastolic value between 55 and 80  during admission.  She was treated with hydrochlorothiazide and losartan  during admission. We decided to discharge the patient with ACE inhibitor per  cardiologist request. Compliance to medication seems to be a problem with  Ms. Vizcarrondo. During admission she remained on her regime with aspirin 325 mg  daily.   PROBLEM #2:  Hyperthyroidism. Compliance with her regime with her Synthroid  at home has been a problem. The patient states she has not been taking her  medications. She had a TSH level highly increased to 61,125. She was put on  Synthroid at her home regime 150 mcg daily, but it was decided to discharge  to the patient on 88 mcg daily and to follow up primary care physician.  After she reached good compliance with medications at home, she should start  increasing the Synthroid as needed. The patient also had a total cholesterol  level of 214, triglycerides 219, and an LDL value of 122. She was on Zocor   during admission, but she was discharged home with Lipitor which she  currently takes at home, though she states that she has not been taking her  medications regularly.   PROBLEM #3:  Type 2 diabetes mellitus. The patient had BCG values from 173  to 230 during her admission and again poor medication compliance probably  leading to poor diabetes control. She does not seem to have organ damage at  the moment and was sent home with Avandia. During her admission she was on  sliding scale insulin and also Avandia. Sliding scale was discontinued  before discharge.  The patient had a hemoglobin A1C on the day of discharge  that was 7.9. She was also placed on a carbohydrate modified 1800-calorie  diet and received education about diabetic diet to follow up at home.  She also received smoking cessation instructions by health educator. It was  decided to place the patient on enalapril and hydrochlorothiazide combined  pill which would help with her high blood pressure and also to prevent renal  damage from her diabetes.   PROBLEM #4:  Residual deficit from prior CVA. The patient is currently on  Plavix 75 mg daily and had remained on this regime during admission and was  discharged with the same dose of Plavix. There does not seem to be any acute  changes on her neurologic status.   PROBLEM #5:  Tobacco abuse. The patient received health education counseling  about smoking cessation during her admission.   PROBLEM #6:  Poor compliance with medical treatment. It was discussed at  length with the patient the importance of good and total control of her  diabetes, hypothyroidism, and her high blood pressure. The patient reported  that there was an issue of her affordability to buy her medications. We  provided the patient with applications for Medicaid and also for assistance  with her medications. She had care management consultation and all these papers are already taking place for her to have  assistance with medications.  She was, at the time of discharge, re-applied for the [facility] to  give the patient $50 in medication for her to have enough medication until  her next appointment, which is this next Friday. She has a form that  probably needs some follow up with her primary care physician to fill them  for medication assistance  for the time she waits until she is approved for  Medicaid.   DISCHARGE LABORATORY:  Metabolic panel on July 24, 2004, showed sodium  of 135, potassium 3.6, chloride 102, CO2 26, BUN 16, creatinine 1.1, glucose  223, calcium 9.1. She also had a lipid profile showing a cholesterol of 214,  triglycerides 219, HDL 48, LDL 122, and DLDL 44. Also, the patient had a  hemoglobin A1C of 7.9 on the day of discharge.   FOLLOWUP:  The patient is to follow up with Dr. Evonnie Pat at the [facility] next Friday, September 16th, at 9 a.m. She basically needs  to go over her application for her assistance with her medications and also  needs to go over her med list with Dr. Evonnie Pat. She had a follow-up  appointment as well as to monitor her TSH level.                                                Sharin Grave, MD    AM/MEDQ  D:  07/25/2004  T:  07/26/2004  Job:  [id number]   cc:   Lawerance Sabal, MD  Fax: [phone]   Vida Roller, M.D.  Fax: [phone]

## 2011-04-20 ENCOUNTER — Other Ambulatory Visit (HOSPITAL_COMMUNITY): Payer: Self-pay | Admitting: Family Medicine

## 2011-04-20 DIAGNOSIS — Z1231 Encounter for screening mammogram for malignant neoplasm of breast: Secondary | ICD-10-CM

## 2011-04-25 ENCOUNTER — Emergency Department (HOSPITAL_COMMUNITY)
Admission: EM | Admit: 2011-04-25 | Discharge: 2011-04-26 | Disposition: A | Discharge status: Home / Self Care | Payer: Medicaid Other | Attending: Emergency Medicine | Admitting: Emergency Medicine

## 2011-04-25 DIAGNOSIS — I10 Essential (primary) hypertension: Secondary | ICD-10-CM | POA: Insufficient documentation

## 2011-04-25 DIAGNOSIS — Z8673 Personal history of transient ischemic attack (TIA), and cerebral infarction without residual deficits: Secondary | ICD-10-CM | POA: Insufficient documentation

## 2011-04-25 DIAGNOSIS — R109 Unspecified abdominal pain: Secondary | ICD-10-CM | POA: Insufficient documentation

## 2011-04-25 DIAGNOSIS — N390 Urinary tract infection, site not specified: Secondary | ICD-10-CM | POA: Insufficient documentation

## 2011-04-25 DIAGNOSIS — E119 Type 2 diabetes mellitus without complications: Secondary | ICD-10-CM | POA: Insufficient documentation

## 2011-04-25 DIAGNOSIS — E039 Hypothyroidism, unspecified: Secondary | ICD-10-CM | POA: Insufficient documentation

## 2011-04-25 LAB — GLUCOSE, CAPILLARY

## 2011-04-26 LAB — URINALYSIS, ROUTINE W REFLEX MICROSCOPIC
Bilirubin Urine: NEGATIVE
Ketones, ur: NEGATIVE mg/dL
Nitrite: POSITIVE — AB
Protein, ur: 30 mg/dL — AB
Urobilinogen, UA: 0.2 mg/dL (ref 0.0–1.0)

## 2011-04-26 LAB — DIFFERENTIAL
Eosinophils Relative: 0 % (ref 0–5)
Lymphocytes Relative: 16 % (ref 12–46)
Lymphs Abs: 1.5 10*3/uL (ref 0.7–4.0)
Monocytes Absolute: 0.7 10*3/uL (ref 0.1–1.0)
Monocytes Relative: 7 % (ref 3–12)

## 2011-04-26 LAB — CBC
HCT: 31.6 % — ABNORMAL LOW (ref 36.0–46.0)
MCHC: 32.9 g/dL (ref 30.0–36.0)
MCV: 87.3 fL (ref 78.0–100.0)
RDW: 14.6 % (ref 11.5–15.5)
WBC: 9.9 10*3/uL (ref 4.0–10.5)

## 2011-04-26 LAB — POCT I-STAT, CHEM 8
Calcium, Ion: 1.19 mmol/L (ref 1.12–1.32)
Chloride: 100 mEq/L (ref 96–112)
Glucose, Bld: 283 mg/dL — ABNORMAL HIGH (ref 70–99)
HCT: 34 % — ABNORMAL LOW (ref 36.0–46.0)

## 2011-05-01 ENCOUNTER — Ambulatory Visit (HOSPITAL_COMMUNITY): Payer: Medicaid Other

## 2011-05-05 ENCOUNTER — Ambulatory Visit (HOSPITAL_COMMUNITY): Payer: Medicaid Other

## 2011-05-18 ENCOUNTER — Ambulatory Visit (HOSPITAL_COMMUNITY)
Admission: RE | Admit: 2011-05-18 | Discharge: 2011-05-18 | Disposition: A | Discharge status: Home / Self Care | Payer: Medicaid Other | Source: Ambulatory Visit | Attending: Family Medicine | Admitting: Family Medicine

## 2011-05-18 DIAGNOSIS — Z1231 Encounter for screening mammogram for malignant neoplasm of breast: Secondary | ICD-10-CM

## 2011-07-11 ENCOUNTER — Other Ambulatory Visit: Payer: Self-pay | Admitting: Family Medicine

## 2011-08-28 LAB — COMPREHENSIVE METABOLIC PANEL
AST: 17
Albumin: 3.2 — ABNORMAL LOW
Alkaline Phosphatase: 45
BUN: 8
CO2: 26
Calcium: 9.5
Chloride: 107
Creatinine, Ser: 2.16 — ABNORMAL HIGH
GFR calc non Af Amer: 24 — ABNORMAL LOW
Glucose, Bld: 256 — ABNORMAL HIGH
Potassium: 3.9
Total Bilirubin: 0.6
Total Bilirubin: 0.7

## 2011-08-28 LAB — DIFFERENTIAL
Basophils Absolute: 0
Eosinophils Absolute: 0
Lymphocytes Relative: 14
Lymphs Abs: 1.7
Neutrophils Relative %: 82 — ABNORMAL HIGH

## 2011-08-28 LAB — FERRITIN: Ferritin: 163 (ref 10–291)

## 2011-08-28 LAB — HEMOGLOBIN AND HEMATOCRIT, BLOOD: Hemoglobin: 9.4 — ABNORMAL LOW

## 2011-08-28 LAB — CBC
HCT: 26 — ABNORMAL LOW
HCT: 46.2 — ABNORMAL HIGH
Hemoglobin: 15.6 — ABNORMAL HIGH
MCHC: 33.8
MCHC: 34.6
MCV: 87.7
MCV: 88.2
MCV: 88.6
Platelets: 180
Platelets: 186
Platelets: 192
RBC: 5.21 — ABNORMAL HIGH
RDW: 16 — ABNORMAL HIGH
WBC: 5.6
WBC: 5.9

## 2011-08-28 LAB — CULTURE, BLOOD (ROUTINE X 2): Culture: NO GROWTH

## 2011-08-28 LAB — URINE MICROSCOPIC-ADD ON

## 2011-08-28 LAB — URINALYSIS, ROUTINE W REFLEX MICROSCOPIC
Hgb urine dipstick: NEGATIVE
Specific Gravity, Urine: 1.026
Urobilinogen, UA: 0.2
pH: 5

## 2011-08-28 LAB — BASIC METABOLIC PANEL
BUN: 17
BUN: 9
CO2: 26
Calcium: 8.7
Calcium: 8.9
Chloride: 106
Chloride: 106
Creatinine, Ser: 1.19
Creatinine, Ser: 1.51 — ABNORMAL HIGH
GFR calc non Af Amer: 47 — ABNORMAL LOW

## 2011-08-28 LAB — TSH: TSH: 3.087

## 2011-08-28 LAB — FOLATE: Folate: 12.9

## 2011-08-28 LAB — RETICULOCYTES
RBC.: 3.24 — ABNORMAL LOW
Retic Count, Absolute: 22.7
Retic Ct Pct: 0.7

## 2011-08-28 LAB — LIPASE, BLOOD: Lipase: 16

## 2011-08-28 LAB — URINE CULTURE

## 2012-02-25 ENCOUNTER — Encounter (HOSPITAL_COMMUNITY): Payer: Self-pay | Admitting: *Deleted

## 2012-02-25 ENCOUNTER — Emergency Department (HOSPITAL_COMMUNITY)
Admission: EM | Admit: 2012-02-25 | Discharge: 2012-02-26 | Disposition: A | Discharge status: Home / Self Care | Payer: Medicaid Other | Attending: Emergency Medicine | Admitting: Emergency Medicine

## 2012-02-25 DIAGNOSIS — M25569 Pain in unspecified knee: Secondary | ICD-10-CM | POA: Insufficient documentation

## 2012-02-25 DIAGNOSIS — E119 Type 2 diabetes mellitus without complications: Secondary | ICD-10-CM | POA: Insufficient documentation

## 2012-02-25 DIAGNOSIS — M25529 Pain in unspecified elbow: Secondary | ICD-10-CM | POA: Insufficient documentation

## 2012-02-25 DIAGNOSIS — M25519 Pain in unspecified shoulder: Secondary | ICD-10-CM | POA: Insufficient documentation

## 2012-02-25 DIAGNOSIS — R109 Unspecified abdominal pain: Secondary | ICD-10-CM | POA: Insufficient documentation

## 2012-02-25 DIAGNOSIS — Z79899 Other long term (current) drug therapy: Secondary | ICD-10-CM | POA: Insufficient documentation

## 2012-02-25 DIAGNOSIS — I10 Essential (primary) hypertension: Secondary | ICD-10-CM | POA: Insufficient documentation

## 2012-02-25 DIAGNOSIS — R739 Hyperglycemia, unspecified: Secondary | ICD-10-CM

## 2012-02-25 DIAGNOSIS — Z7982 Long term (current) use of aspirin: Secondary | ICD-10-CM | POA: Insufficient documentation

## 2012-02-25 DIAGNOSIS — H9319 Tinnitus, unspecified ear: Secondary | ICD-10-CM | POA: Insufficient documentation

## 2012-02-25 DIAGNOSIS — K2970 Gastritis, unspecified, without bleeding: Secondary | ICD-10-CM

## 2012-02-25 DIAGNOSIS — M542 Cervicalgia: Secondary | ICD-10-CM | POA: Insufficient documentation

## 2012-02-25 DIAGNOSIS — R51 Headache: Secondary | ICD-10-CM | POA: Insufficient documentation

## 2012-02-25 DIAGNOSIS — D649 Anemia, unspecified: Secondary | ICD-10-CM | POA: Insufficient documentation

## 2012-02-25 DIAGNOSIS — Z8673 Personal history of transient ischemic attack (TIA), and cerebral infarction without residual deficits: Secondary | ICD-10-CM | POA: Insufficient documentation

## 2012-02-25 DIAGNOSIS — R29898 Other symptoms and signs involving the musculoskeletal system: Secondary | ICD-10-CM | POA: Insufficient documentation

## 2012-02-25 NOTE — ED Notes (Signed)
Pt has multiple complaints. Pt reports her head feels congested, her ears are ringing and her legs feel weak. Pt reports symptoms have been going on for a few days.  Pt also reports generalized abdominal pain. Pt denies N/V/D.

## 2012-02-26 LAB — BASIC METABOLIC PANEL
BUN: 28 mg/dL — ABNORMAL HIGH (ref 6–23)
Chloride: 96 mEq/L (ref 96–112)
GFR calc Af Amer: 45 mL/min — ABNORMAL LOW (ref >90)
Glucose, Bld: 310 mg/dL — ABNORMAL HIGH (ref 70–99)
Potassium: 3.7 mEq/L (ref 3.5–5.1)
Sodium: 132 mEq/L — ABNORMAL LOW (ref 135–145)

## 2012-02-26 LAB — CBC
HCT: 29.8 % — ABNORMAL LOW (ref 36.0–46.0)
Hemoglobin: 9.9 g/dL — ABNORMAL LOW (ref 12.0–15.0)
WBC: 5.5 10*3/uL (ref 4.0–10.5)

## 2012-02-26 MED ORDER — ACETAMINOPHEN 325 MG PO TABS
650.0000 mg | ORAL_TABLET | Freq: Once | ORAL | Status: AC
  Administered 2012-02-26: 650 mg via ORAL
  Filled 2012-02-26: qty 2

## 2012-02-26 MED ORDER — GI COCKTAIL ~~LOC~~
30.0000 mL | Freq: Once | ORAL | Status: AC
  Administered 2012-02-26: 30 mL via ORAL
  Filled 2012-02-26: qty 30

## 2012-02-26 MED ORDER — FERROUS SULFATE 325 (65 FE) MG PO TABS: 325.0000 mg | ORAL_TABLET | Freq: Every day | ORAL | Status: DC

## 2012-02-26 NOTE — Discharge Instructions (Signed)
Continue taking your medications as prescribed. Began taking it daily iron tablet as your blood counts were slightly low today, showing anemia. Followup with your doctor this week to discuss your elevated blood sugars and possible change in your insulin regimen. Return to the emergency department for worsening headache, weakness dizziness or other new concerning symptoms.  Anemia, Nonspecific Your exam and blood tests show you are anemic. This means your blood (hemoglobin) level is low. Normal hemoglobin values are 12 to 15 g/dL for females and 14 to 17 g/dL for males. Make a note of your hemoglobin level today. The hematocrit percent is also used to measure anemia. A normal hematocrit is 38% to 46% in females and 42% to 49% in males. Make a note of your hematocrit level today. CAUSES  Anemia can be due to many different causes.  Excessive bleeding from periods (in women).   Intestinal bleeding.   Poor nutrition.   Kidney, thyroid, liver, and bone marrow diseases.  SYMPTOMS  Anemia can come on suddenly (acute). It can also come on slowly. Symptoms can include:  Minor weakness.   Dizziness.   Palpitations.   Shortness of breath.  Symptoms may be absent until half your hemoglobin is missing if it comes on slowly. Anemia due to acute blood loss from an injury or internal bleeding may require blood transfusion if the loss is severe. Hospital care is needed if you are anemic and there is significant continual blood loss. TREATMENT   Stool tests for blood (Hemoccult) and additional lab tests are often needed. This determines the best treatment.   Further checking on your condition and your response to treatment is very important. It often takes many weeks to correct anemia.  Depending on the cause, treatment can include:  Supplements of iron.   Vitamins B12 and folic acid.   Hormone medicines.If your anemia is due to bleeding, finding the cause of the blood loss is very important. This  will help avoid further problems.  SEEK IMMEDIATE MEDICAL CARE IF:   You develop fainting, extreme weakness, shortness of breath, or chest pain.   You develop heavy vaginal bleeding.   You develop bloody or black, tarry stools or vomit up blood.   You develop a high fever, rash, repeated vomiting, or dehydration.  Document Released: 12/07/2004 Document Revised: 10/19/2011 Document Reviewed: 09/14/2009 [facility] Patient Information 2012 [place], [place].  Diabetes, Frequently Asked Questions WHAT IS DIABETES? Most of the food we eat is turned into glucose (sugar). Our bodies use it for energy. The pancreas makes a hormone called insulin. It helps glucose get into the cells of our bodies. When you have diabetes, your body either does not make enough insulin or cannot use its own insulin as well as it should. This causes sugars to build up in your blood. WHAT ARE THE SYMPTOMS OF DIABETES?  Frequent urination.   Excessive thirst.   Unexplained weight loss.   Extreme hunger.   Blurred vision.   Tingling or numbness in hands or feet.   Feeling very tired much of the time.   Dry, itchy skin.   Sores that are slow to heal.   Yeast infections.  WHAT ARE THE TYPES OF DIABETES? Type 1 Diabetes   About 10% of affected people have this type.   Usually occurs before the age of 36.   Usually occurs in thin to normal weight people.  Type 2 Diabetes  About 90% of affected people have this type.   Usually occurs after the  age of 92.   Usually occurs in overweight people.   More likely to have:   A family history of diabetes.   A history of diabetes during pregnancy (gestational diabetes).   High blood pressure.   High cholesterol and triglycerides.  Gestational Diabetes  Occurs in about 4% of pregnancies.   Usually goes away after the baby is born.   More likely to occur in women with:   Family history of diabetes.   Previous gestational diabetes.   Obese.    Over 45 years old.  WHAT IS PRE-DIABETES? Pre-diabetes means your blood glucose is higher than normal, but lower than the diabetes range. It also means you are at risk of getting type 2 diabetes and heart disease. If you are told you have pre-diabetes, have your blood glucose checked again in 1 to 2 years. WHAT IS THE TREATMENT FOR DIABETES? Treatment is aimed at keeping blood glucose near normal levels at all times. Learning how to manage this yourself is important in treating diabetes. Depending on the type of diabetes you have, your treatment will include one or more of the following:  Monitoring your blood glucose.   Meal planning.   Exercise.   Oral medicine (pills) or insulin.  CAN DIABETES BE PREVENTED? With type 1 diabetes, prevention is more difficult, because the triggers that cause it are not yet known. With type 2 diabetes, prevention is more likely, with lifestyle changes:  Maintain a healthy weight.   Eat healthy.   Exercise.  IS THERE A CURE FOR DIABETES? No, there is no cure for diabetes. There is a lot of research going on that is looking for a cure, and progress is being made. Diabetes can be treated and controlled. People with diabetes can manage their diabetes and lead normal, active lives. SHOULD I BE TESTED FOR DIABETES? If you are at least 60 years old, you should be tested for diabetes. You should be tested again every 3 years. If you are 45 or older and overweight, you may want to get tested more often. If you are younger than 45, overweight, and have one or more of the following risk factors, you should be tested:  Family history of diabetes.   Inactive lifestyle.   High blood pressure.  WHAT ARE SOME OTHER SOURCES FOR INFORMATION ON DIABETES? The following organizations may help in your search for more information on diabetes: National Diabetes Education Program (NDEP) Internet: SolarDiscussions.es American Diabetes  Association Internet: http://www.diabetes.org  Juvenile Diabetes Foundation International Internet: [employer] Document Released: 11/02/2003 Document Revised: 10/19/2011 Document Reviewed: 08/27/2009 [facility] Patient Information 2012 [place], [place]. Gastritis Gastritis is an inflammation (the body's way of reacting to injury and/or infection) of the stomach. It is often caused by viral or bacterial (germ) infections. It can also be caused by chemicals (including alcohol) and medications. This illness may be associated with generalized malaise (feeling tired, not well), cramps, and fever. The illness may last 2 to 7 days. If symptoms of gastritis continue, gastroscopy (looking into the stomach with a telescope-like instrument), biopsy (taking tissue samples), and/or blood tests may be necessary to determine the cause. Antibiotics will not affect the illness unless there is a bacterial infection present. One common bacterial cause of gastritis is an organism known as H. Pylori. This can be treated with antibiotics. Other forms of gastritis are caused by too much acid in the stomach. They can be treated with medications such as H2 blockers and antacids. Home treatment is usually all that is  needed. Young children will quickly become dehydrated (loss of body fluids) if vomiting and diarrhea are both present. Medications may be given to control nausea. Medications are usually not given for diarrhea unless especially bothersome. Some medications slow the removal of the virus from the gastrointestinal tract. This slows down the healing process. HOME CARE INSTRUCTIONS Home care instructions for nausea and vomiting:  For adults: drink small amounts of fluids often. Drink at least 2 quarts a day. Take sips frequently. Do not drink large amounts of fluid at one time. This may worsen the nausea.   Only take over-the-counter or prescription medicines for pain, discomfort, or fever as directed by your  caregiver.   Drink clear liquids only. Those are anything you can see through such as water, broth, or soft drinks.   Once you are keeping clear liquids down, you may start full liquids, soups, juices, and ice cream or sherbet. Slowly add bland (plain, not spicy) foods to your diet.  Home care instructions for diarrhea:  Diarrhea can be caused by bacterial infections or a virus. Your condition should improve with time, rest, fluids, and/or anti-diarrheal medication.   Until your diarrhea is under control, you should drink clear liquids often in small amounts. Clear liquids include: water, broth, jell-o water and weak tea.  Avoid:  Milk.   Fruits.   Tobacco.   Alcohol.   Extremely hot or cold fluids.   Too much intake of anything at one time.  When your diarrhea stops you may add the following foods, which help the stool to become more formed:  Rice.   Bananas.   Apples without skin.   Dry toast.  Once these foods are tolerated you may add low-fat yogurt and low-fat cottage cheese. They will help to restore the normal bacterial balance in your bowel. Wash your hands well to avoid spreading bacteria (germ) or virus. SEEK IMMEDIATE MEDICAL CARE IF:   You are unable to keep fluids down.   Vomiting or diarrhea become persistent (constant).   Abdominal pain develops, increases, or localizes. (Right sided pain can be appendicitis. Left sided pain in adults can be diverticulitis.)   You develop a fever (an oral temperature above 102 F (38.9 C)).   Diarrhea becomes excessive or contains blood or mucus.   You have excessive weakness, dizziness, fainting or extreme thirst.   You are not improving or you are getting worse.   You have any other questions or concerns.  Document Released: 10/24/2001 Document Revised: 10/19/2011 Document Reviewed: 10/30/2005 [facility] Patient Information 2012 [place], [place].  General Headache, Without Cause A general headache has no specific  cause. These headaches are not life-threatening. They will not lead to other types of headaches. HOME CARE   Make and keep follow-up visits with your doctor.   Only take medicine as told by your doctor.   Try to relax, get a massage, or use your thoughts to control your body (biofeedback).   Apply cold or heat to the head and neck. Apply 3 or 4 times a day or as needed.  Finding out the results of your test Ask when your test results will be ready. Make sure you get your test results. GET HELP RIGHT AWAY IF:   You have problems with medicine.   Your medicine does not help relieve pain.   Your headache changes or becomes worse.   You feel sick to your stomach (nauseous) or throw up (vomit).   You have a temperature by mouth above 102  F (38.9 C), not controlled by medicine.   Your have a stiff neck.   You have vision loss.   You have muscle weakness.   You lose control of your muscles.   You lose balance or have trouble walking.   You feel like you are going to pass out (faint).  MAKE SURE YOU:   Understand these instructions.   Will watch this condition.   Will get help right away if you are not doing well or get worse.  Document Released: 08/08/2008 Document Revised: 10/19/2011 Document Reviewed: 08/08/2008 [facility] Patient Information 2012 [place], [place].

## 2012-02-26 NOTE — ED Notes (Addendum)
Pt reports epigastric burning x2 days - pt admits to hx of gastric reflux - denies n/v/d or fevers.

## 2012-02-26 NOTE — ED Provider Notes (Signed)
History     CSN: [id number]  Arrival date & time 02/25/12  2125   First MD Initiated Contact with Patient 02/25/12 2333      Chief Complaint  Patient presents with  . Tinnitus  . Abdominal Pain  . Dizziness  . Nasal Congestion    (Consider location/radiation/quality/duration/timing/severity/associated sxs/prior treatment) HPI 61 year old female presents to emergency room with multiple complaints. Patient reports upon waking this morning she had about a minutes worth of tinnitus in her left ear. She has had no tinnitus since that time. Patient also complaining of right neck and shoulder stiffness and pain. Patient complaining of posterior headache. Patient reports that her left elbow has been "locking up" on her during the day. Patient also reports that she has tenderness behind her left knee which is chronic but seems worse today than usual. This evening patient had onset of abdominal upset, which she describes as a fire in her stomach. Patient feels that her left ankle is slightly swollen. Patient denies fever, but has had episodes of hot and cold flashes lasting seconds at a time. She has had no vomiting. Patient is eating and drinking well. Patient reported to triage nurse congestion and dizziness, but she reports that she has episodes where she feels like she is smelling "ether" and she is swimmy headed. These episodes last a few seconds at a time. He should was concerned that she may be on the verge of having a stroke, patient has had 2 prior strokes. Patient wanted to be in the hospital if she had another stroke. Patient was seen by her primary care provider on Friday for a cyst on her shoulder. At that time she had elevated blood sugars, and was told to watch her sugars over the weekend. Patient denies missing any this is of her diabetes medicines, no change in her diet. Past Medical History  Diagnosis Date  . CVA (cerebral vascular accident) 01/2011    affected rt side  . Diabetes  mellitus   . Hypertension     Past Surgical History  Procedure Date  . Tubal ligation   . Cesarean section     No family history on file.  History  Substance Use Topics  . Smoking status: Former Games developer  . Smokeless tobacco: Not on file  . Alcohol Use: No    OB History    Grav Para Term Preterm Abortions TAB SAB Ect Mult Living                  Review of Systems  All other systems reviewed and are negative.   other than listed in history of present illness  Allergies  Review of patient's allergies indicates no known allergies.  Home Medications   Current Outpatient Rx  Name Route Sig Dispense Refill  . AMLODIPINE BESYLATE 5 MG PO TABS Oral Take 5 mg by mouth daily.    . ASPIRIN EC 81 MG PO TBEC Oral Take 81 mg by mouth daily.    Marland Kitchen CALTRATE 600 PO Oral Take 1 tablet by mouth daily.    Marland Kitchen CLOPIDOGREL BISULFATE 75 MG PO TABS Oral Take 75 mg by mouth daily.    Marland Kitchen GEMFIBROZIL 600 MG PO TABS Oral Take 600 mg by mouth 2 (two) times daily before a meal.    . GLIMEPIRIDE 2 MG PO TABS Oral Take 2 mg by mouth 2 (two) times daily.    . INSULIN GLARGINE 100 UNIT/ML [place] SOLN Subcutaneous Inject 25 Units into the skin at  bedtime.    Marland Kitchen LEVOTHYROXINE SODIUM 75 MCG PO TABS Oral Take 75 mcg by mouth daily.    Marland Kitchen LISINOPRIL-HYDROCHLOROTHIAZIDE 10-12.5 MG PO TABS Oral Take 1 tablet by mouth daily.    . ADULT MULTIVITAMIN W/MINERALS CH Oral Take 1 tablet by mouth daily.    Marland Kitchen PRAVASTATIN SODIUM 20 MG PO TABS Oral Take 20 mg by mouth daily.    Marland Kitchen ROPINIROLE HCL 0.25 MG PO TABS Oral Take 0.5 mg by mouth at bedtime.    . TRAMADOL HCL 50 MG PO TABS Oral Take 50 mg by mouth every 8 (eight) hours as needed. Pain.      BP 153/54  Pulse 66  Temp(Src) 97.8 F (36.6 C) (Oral)  Resp 18  Ht 5\' 3"  (1.6 m)  Wt 170 lb (77.111 kg)  BMI 30.11 kg/m2  SpO2 97%  Physical Exam  Nursing note and vitals reviewed. Constitutional: She is oriented to person, place, and time. She appears well-developed and  well-nourished.  HENT:  Head: Normocephalic and atraumatic.  Nose: Nose normal.  Mouth/Throat: Oropharynx is clear and moist.  Eyes: Conjunctivae and EOM are normal. Pupils are equal, round, and reactive to light.  Neck: Normal range of motion. Neck supple. No JVD present. No tracheal deviation present. No thyromegaly present.  Cardiovascular: Normal rate, regular rhythm, normal heart sounds and intact distal pulses.  Exam reveals no gallop and no friction rub.   No murmur heard. Pulmonary/Chest: Effort normal and breath sounds normal. No stridor. No respiratory distress. She has no wheezes. She has no rales. She exhibits no tenderness.  Abdominal: Soft. Bowel sounds are normal. She exhibits no distension and no mass. There is no tenderness. There is no rebound and no guarding.  Musculoskeletal: Normal range of motion. She exhibits no edema (no edema appreciated in left leg) and no tenderness.  Lymphadenopathy:    She has no cervical adenopathy.  Neurological: She is oriented to person, place, and time. She has normal reflexes. No cranial nerve deficit. She exhibits normal muscle tone. Coordination normal.       Weakness in left leg with strength 4-5, patient reports this is her baseline after her stroke a year ago  Skin: Skin is dry. No rash noted. No erythema. No pallor.  Psychiatric: She has a normal mood and affect. Her behavior is normal. Judgment and thought content normal.    ED Course  Procedures (including critical care time)  Labs Reviewed  GLUCOSE, CAPILLARY - Abnormal; Notable for the following:    Glucose-Capillary 352 (*)    All other components within normal limits  CBC  BASIC METABOLIC PANEL   No results found.   No diagnosis found.    MDM  Patient with a constellation of symptoms, no acute signs of stroke at this time. Suspect some of her symptoms may be due to her elevated sugars. Will try to get her headache improved, and check baseline  labs.      Patient with complete resolution of headache after Tylenol. No further epigastric burning after GI cocktail. Patient continues to be stable. Patient labs show hyperglycemia and anemia. Patient denies prior history of anemia. Will start on iron. Patient is to followup with her primary care provider this week for recheck of her sugars and possible change in her insulin regimen.  Olivia Mackie, MD 02/26/12 [phone]

## 2012-02-26 NOTE — ED Notes (Signed)
D/c instructions reviewed w/ pt - pt denies any further questions or concerns at present.   

## 2012-03-05 ENCOUNTER — Inpatient Hospital Stay (HOSPITAL_COMMUNITY)
Admission: EM | Admit: 2012-03-05 | Discharge: 2012-03-09 | DRG: 287 | Disposition: A | Discharge status: Home / Self Care | Payer: Medicaid Other | Attending: Internal Medicine | Admitting: Internal Medicine

## 2012-03-05 ENCOUNTER — Encounter (HOSPITAL_COMMUNITY): Payer: Self-pay | Admitting: *Deleted

## 2012-03-05 ENCOUNTER — Emergency Department (HOSPITAL_COMMUNITY): Payer: Medicaid Other

## 2012-03-05 DIAGNOSIS — Z794 Long term (current) use of insulin: Secondary | ICD-10-CM

## 2012-03-05 DIAGNOSIS — E1122 Type 2 diabetes mellitus with diabetic chronic kidney disease: Secondary | ICD-10-CM | POA: Diagnosis present

## 2012-03-05 DIAGNOSIS — E785 Hyperlipidemia, unspecified: Secondary | ICD-10-CM | POA: Diagnosis present

## 2012-03-05 DIAGNOSIS — Z7982 Long term (current) use of aspirin: Secondary | ICD-10-CM

## 2012-03-05 DIAGNOSIS — R079 Chest pain, unspecified: Secondary | ICD-10-CM | POA: Diagnosis present

## 2012-03-05 DIAGNOSIS — R0609 Other forms of dyspnea: Secondary | ICD-10-CM | POA: Diagnosis present

## 2012-03-05 DIAGNOSIS — I70219 Atherosclerosis of native arteries of extremities with intermittent claudication, unspecified extremity: Secondary | ICD-10-CM | POA: Diagnosis present

## 2012-03-05 DIAGNOSIS — Z8673 Personal history of transient ischemic attack (TIA), and cerebral infarction without residual deficits: Secondary | ICD-10-CM

## 2012-03-05 DIAGNOSIS — R0989 Other specified symptoms and signs involving the circulatory and respiratory systems: Secondary | ICD-10-CM | POA: Diagnosis present

## 2012-03-05 DIAGNOSIS — E039 Hypothyroidism, unspecified: Secondary | ICD-10-CM | POA: Diagnosis present

## 2012-03-05 DIAGNOSIS — I2510 Atherosclerotic heart disease of native coronary artery without angina pectoris: Principal | ICD-10-CM | POA: Diagnosis present

## 2012-03-05 DIAGNOSIS — I10 Essential (primary) hypertension: Secondary | ICD-10-CM | POA: Diagnosis present

## 2012-03-05 DIAGNOSIS — F17200 Nicotine dependence, unspecified, uncomplicated: Secondary | ICD-10-CM | POA: Diagnosis present

## 2012-03-05 DIAGNOSIS — Z87891 Personal history of nicotine dependence: Secondary | ICD-10-CM | POA: Diagnosis present

## 2012-03-05 DIAGNOSIS — Z7902 Long term (current) use of antithrombotics/antiplatelets: Secondary | ICD-10-CM

## 2012-03-05 DIAGNOSIS — E119 Type 2 diabetes mellitus without complications: Secondary | ICD-10-CM | POA: Diagnosis present

## 2012-03-05 LAB — CBC
HCT: 32.1 % — ABNORMAL LOW (ref 36.0–46.0)
Hemoglobin: 10.6 g/dL — ABNORMAL LOW (ref 12.0–15.0)
Hemoglobin: 10.5 g/dL — ABNORMAL LOW (ref 12.0–15.0)
MCH: 29.6 pg (ref 26.0–34.0)
MCHC: 33 g/dL (ref 30.0–36.0)
MCHC: 32.7 g/dL (ref 30.0–36.0)
Platelets: 275 10*3/uL (ref 150–400)
RBC: 3.58 MIL/uL — ABNORMAL LOW (ref 3.87–5.11)

## 2012-03-05 LAB — BASIC METABOLIC PANEL
BUN: 27 mg/dL — ABNORMAL HIGH (ref 6–23)
CO2: 26 mEq/L (ref 19–32)
GFR calc non Af Amer: 41 mL/min — ABNORMAL LOW (ref >90)
Glucose, Bld: 130 mg/dL — ABNORMAL HIGH (ref 70–99)
Potassium: 3.7 mEq/L (ref 3.5–5.1)

## 2012-03-05 LAB — CREATININE, SERUM
Creatinine, Ser: 1.4 mg/dL — ABNORMAL HIGH (ref 0.50–1.10)
GFR calc non Af Amer: 40 mL/min — ABNORMAL LOW (ref >90)

## 2012-03-05 LAB — POCT I-STAT TROPONIN I

## 2012-03-05 LAB — GLUCOSE, CAPILLARY

## 2012-03-05 LAB — LIPID PANEL
LDL Cholesterol: 143 mg/dL — ABNORMAL HIGH (ref 0–99)
Total CHOL/HDL Ratio: 5.1 RATIO
Triglycerides: 180 mg/dL — ABNORMAL HIGH (ref <150)
VLDL: 36 mg/dL (ref 0–40)

## 2012-03-05 LAB — CARDIAC PANEL(CRET KIN+CKTOT+MB+TROPI): Total CK: 248 U/L — ABNORMAL HIGH (ref 7–177)

## 2012-03-05 LAB — PRO B NATRIURETIC PEPTIDE: Pro B Natriuretic peptide (BNP): 20.6 pg/mL (ref 0–125)

## 2012-03-05 LAB — D-DIMER, QUANTITATIVE: D-Dimer, Quant: 16.2 ug/mL-FEU — ABNORMAL HIGH (ref 0.00–0.48)

## 2012-03-05 MED ORDER — ADULT MULTIVITAMIN W/MINERALS CH
1.0000 | ORAL_TABLET | Freq: Every day | ORAL | Status: DC
  Administered 2012-03-06 – 2012-03-09 (×4): 1 via ORAL
  Filled 2012-03-05 (×4): qty 1

## 2012-03-05 MED ORDER — SODIUM CHLORIDE 0.45 % IV SOLN
INTRAVENOUS | Status: DC
  Administered 2012-03-05 – 2012-03-06 (×2): via INTRAVENOUS

## 2012-03-05 MED ORDER — ASPIRIN EC 325 MG PO TBEC
325.0000 mg | DELAYED_RELEASE_TABLET | Freq: Every day | ORAL | Status: DC
  Administered 2012-03-06 – 2012-03-07 (×2): 325 mg via ORAL
  Filled 2012-03-05 (×3): qty 1

## 2012-03-05 MED ORDER — AMLODIPINE BESYLATE 5 MG PO TABS
5.0000 mg | ORAL_TABLET | Freq: Every day | ORAL | Status: DC
  Administered 2012-03-06 – 2012-03-09 (×4): 5 mg via ORAL
  Filled 2012-03-05 (×4): qty 1

## 2012-03-05 MED ORDER — GEMFIBROZIL 600 MG PO TABS
600.0000 mg | ORAL_TABLET | Freq: Two times a day (BID) | ORAL | Status: DC
  Administered 2012-03-06: 600 mg via ORAL
  Filled 2012-03-05 (×3): qty 1

## 2012-03-05 MED ORDER — LISINOPRIL-HYDROCHLOROTHIAZIDE 10-12.5 MG PO TABS: 1.0000 | ORAL_TABLET | ORAL | Status: DC

## 2012-03-05 MED ORDER — ACETAMINOPHEN 325 MG PO TABS
650.0000 mg | ORAL_TABLET | Freq: Four times a day (QID) | ORAL | Status: DC | PRN
  Administered 2012-03-05 – 2012-03-07 (×2): 650 mg via ORAL
  Filled 2012-03-05 (×3): qty 2

## 2012-03-05 MED ORDER — ACETAMINOPHEN 650 MG RE SUPP: 650.0000 mg | Freq: Four times a day (QID) | RECTAL | Status: DC | PRN

## 2012-03-05 MED ORDER — HYDROCHLOROTHIAZIDE 12.5 MG PO CAPS
12.5000 mg | ORAL_CAPSULE | Freq: Every day | ORAL | Status: DC
  Filled 2012-03-05: qty 1

## 2012-03-05 MED ORDER — ALBUTEROL SULFATE (5 MG/ML) 0.5% IN NEBU: 2.5000 mg | INHALATION_SOLUTION | RESPIRATORY_TRACT | Status: DC | PRN

## 2012-03-05 MED ORDER — FERROUS SULFATE 325 (65 FE) MG PO TABS
325.0000 mg | ORAL_TABLET | Freq: Every day | ORAL | Status: DC
  Administered 2012-03-06 – 2012-03-09 (×4): 325 mg via ORAL
  Filled 2012-03-05 (×6): qty 1

## 2012-03-05 MED ORDER — ENOXAPARIN SODIUM 40 MG/0.4ML ~~LOC~~ SOLN
40.0000 mg | Freq: Every day | SUBCUTANEOUS | Status: DC
Start: 2012-03-06 — End: 2012-03-09
  Administered 2012-03-06 – 2012-03-09 (×3): 40 mg via SUBCUTANEOUS
  Filled 2012-03-05 (×4): qty 0.4

## 2012-03-05 MED ORDER — INSULIN ASPART 100 UNIT/ML ~~LOC~~ SOLN
0.0000 [IU] | Freq: Three times a day (TID) | SUBCUTANEOUS | Status: DC
  Administered 2012-03-06 – 2012-03-07 (×3): 5 [IU] via SUBCUTANEOUS
  Administered 2012-03-07: 3 [IU] via SUBCUTANEOUS
  Administered 2012-03-07: 2 [IU] via SUBCUTANEOUS
  Administered 2012-03-08 – 2012-03-09 (×4): 3 [IU] via SUBCUTANEOUS

## 2012-03-05 MED ORDER — ONDANSETRON HCL 4 MG/2ML IJ SOLN: 4.0000 mg | Freq: Four times a day (QID) | INTRAMUSCULAR | Status: DC | PRN

## 2012-03-05 MED ORDER — CLOPIDOGREL BISULFATE 75 MG PO TABS
75.0000 mg | ORAL_TABLET | ORAL | Status: DC
  Filled 2012-03-05: qty 1

## 2012-03-05 MED ORDER — ASPIRIN EC 81 MG PO TBEC
81.0000 mg | DELAYED_RELEASE_TABLET | ORAL | Status: DC
  Filled 2012-03-05: qty 1

## 2012-03-05 MED ORDER — SIMVASTATIN 5 MG PO TABS
5.0000 mg | ORAL_TABLET | Freq: Every day | ORAL | Status: DC
  Filled 2012-03-05: qty 1

## 2012-03-05 MED ORDER — IPRATROPIUM BROMIDE 0.02 % IN SOLN
0.5000 mg | Freq: Four times a day (QID) | RESPIRATORY_TRACT | Status: DC
  Filled 2012-03-05: qty 2.5

## 2012-03-05 MED ORDER — LEVOTHYROXINE SODIUM 75 MCG PO TABS
75.0000 ug | ORAL_TABLET | Freq: Every day | ORAL | Status: DC
  Administered 2012-03-06 – 2012-03-07 (×2): 75 ug via ORAL
  Filled 2012-03-05 (×3): qty 1

## 2012-03-05 MED ORDER — ENOXAPARIN SODIUM 40 MG/0.4ML ~~LOC~~ SOLN
40.0000 mg | SUBCUTANEOUS | Status: DC
  Filled 2012-03-05: qty 0.4

## 2012-03-05 MED ORDER — GLIMEPIRIDE 2 MG PO TABS
2.0000 mg | ORAL_TABLET | Freq: Two times a day (BID) | ORAL | Status: DC
  Administered 2012-03-06 – 2012-03-09 (×6): 2 mg via ORAL
  Filled 2012-03-05 (×9): qty 1

## 2012-03-05 MED ORDER — HYDROCODONE-ACETAMINOPHEN 5-325 MG PO TABS: 1.0000 | ORAL_TABLET | ORAL | Status: DC | PRN

## 2012-03-05 MED ORDER — ROPINIROLE HCL 0.5 MG PO TABS
0.5000 mg | ORAL_TABLET | Freq: Every day | ORAL | Status: DC
  Administered 2012-03-05 – 2012-03-08 (×4): 0.5 mg via ORAL
  Filled 2012-03-05 (×5): qty 1

## 2012-03-05 MED ORDER — ALBUTEROL SULFATE (5 MG/ML) 0.5% IN NEBU
2.5000 mg | INHALATION_SOLUTION | Freq: Four times a day (QID) | RESPIRATORY_TRACT | Status: DC
  Filled 2012-03-05: qty 0.5

## 2012-03-05 MED ORDER — ALBUTEROL SULFATE (5 MG/ML) 0.5% IN NEBU: 2.5000 mg | INHALATION_SOLUTION | Freq: Four times a day (QID) | RESPIRATORY_TRACT | Status: DC

## 2012-03-05 MED ORDER — CLOPIDOGREL BISULFATE 75 MG PO TABS
75.0000 mg | ORAL_TABLET | Freq: Every day | ORAL | Status: DC
  Administered 2012-03-06 – 2012-03-09 (×4): 75 mg via ORAL
  Filled 2012-03-05 (×5): qty 1

## 2012-03-05 MED ORDER — DOCUSATE SODIUM 100 MG PO CAPS
100.0000 mg | ORAL_CAPSULE | Freq: Two times a day (BID) | ORAL | Status: DC
  Administered 2012-03-05 – 2012-03-09 (×7): 100 mg via ORAL
  Filled 2012-03-05 (×9): qty 1

## 2012-03-05 MED ORDER — MORPHINE SULFATE 2 MG/ML IJ SOLN: 2.0000 mg | INTRAMUSCULAR | Status: DC | PRN

## 2012-03-05 MED ORDER — AMLODIPINE BESYLATE 5 MG PO TABS
5.0000 mg | ORAL_TABLET | ORAL | Status: DC
  Filled 2012-03-05: qty 1

## 2012-03-05 MED ORDER — ADULT MULTIVITAMIN W/MINERALS CH
1.0000 | ORAL_TABLET | ORAL | Status: DC
  Filled 2012-03-05: qty 1

## 2012-03-05 MED ORDER — IPRATROPIUM BROMIDE 0.02 % IN SOLN: 0.5000 mg | Freq: Four times a day (QID) | RESPIRATORY_TRACT | Status: DC

## 2012-03-05 MED ORDER — FERROUS SULFATE 325 (65 FE) MG PO TABS
325.0000 mg | ORAL_TABLET | ORAL | Status: DC
  Filled 2012-03-05: qty 1

## 2012-03-05 MED ORDER — INSULIN GLARGINE 100 UNIT/ML ~~LOC~~ SOLN
25.0000 [IU] | Freq: Once | SUBCUTANEOUS | Status: AC
  Administered 2012-03-05: 25 [IU] via SUBCUTANEOUS
  Filled 2012-03-05: qty 1

## 2012-03-05 MED ORDER — LISINOPRIL 10 MG PO TABS
10.0000 mg | ORAL_TABLET | Freq: Every day | ORAL | Status: DC
  Filled 2012-03-05: qty 1

## 2012-03-05 MED ORDER — SODIUM CHLORIDE 0.9 % IJ SOLN
3.0000 mL | Freq: Two times a day (BID) | INTRAMUSCULAR | Status: DC
  Administered 2012-03-05 – 2012-03-09 (×6): 3 mL via INTRAVENOUS

## 2012-03-05 MED ORDER — INSULIN GLARGINE 100 UNIT/ML ~~LOC~~ SOLN
25.0000 [IU] | Freq: Every day | SUBCUTANEOUS | Status: DC
  Administered 2012-03-06 – 2012-03-08 (×3): 25 [IU] via SUBCUTANEOUS

## 2012-03-05 MED ORDER — MORPHINE SULFATE 4 MG/ML IJ SOLN
4.0000 mg | Freq: Once | INTRAMUSCULAR | Status: AC
  Administered 2012-03-05: 4 mg via INTRAVENOUS
  Filled 2012-03-05: qty 1

## 2012-03-05 MED ORDER — NITROGLYCERIN 0.1 MG/HR TD PT24
0.1000 mg | MEDICATED_PATCH | Freq: Every day | TRANSDERMAL | Status: DC
  Administered 2012-03-05 – 2012-03-06 (×2): 0.1 mg via TRANSDERMAL
  Filled 2012-03-05 (×2): qty 1

## 2012-03-05 MED ORDER — GLIMEPIRIDE 2 MG PO TABS
2.0000 mg | ORAL_TABLET | Freq: Two times a day (BID) | ORAL | Status: DC
  Filled 2012-03-05: qty 1

## 2012-03-05 NOTE — H&P (Signed)
[name] is an 61 y.o. female.   Chief Complaint: Chest pain HPI: This 61 year old female with history of hypertension hyperlipidemia diabetes and tobacco abuse presenting with left-sided chest pain on and off for about a week. Pain is described as 8/10 at this height now down to 3/10 after nitroglycerin. Pain has got worse today before coming to the emergency room. It radiates down her left arm, described as sharp, persistent when it comes on. No nausea vomiting or diaphoresis. She feels a little bit of chills. She's also been having exertional dyspnea with pain in her lower extremities especially her left leg which has lead to decrease in her mobility. Her chest pain was notably reproducible with pressure to the chest. She had no prior cardiac history, she however has multiple risk factors for coronary artery disease.  Past Medical History  Diagnosis Date  . CVA (cerebral vascular accident) 01/2011    affected rt side  . Diabetes mellitus   . Hypertension     Past Surgical History  Procedure Date  . Tubal ligation   . Cesarean section     History reviewed. No pertinent family history. Social History:  reports that she has quit smoking. She does not have any smokeless tobacco history on file. She reports that she does not drink alcohol or use illicit drugs.  Allergies: No Known Allergies   (Not in a hospital admission)  Results for orders placed during the hospital encounter of 03/05/12 (from the past 48 hour(s))  CBC     Status: Abnormal   Collection Time   03/05/12  5:45 PM      Component Value Range Comment   WBC 5.1  4.0 - 10.5 (K/uL)    RBC 3.58 (*) 3.87 - 5.11 (MIL/uL)    Hemoglobin 10.6 (*) 12.0 - 15.0 (g/dL)    HCT 96.0 (*) 45.4 - 46.0 (%)    MCV 89.7  78.0 - 100.0 (fL)    MCH 29.6  26.0 - 34.0 (pg)    MCHC 33.0  30.0 - 36.0 (g/dL)    RDW 09.8  11.9 - 14.7 (%)    Platelets 262  150 - 400 (K/uL)   BASIC METABOLIC PANEL     Status: Abnormal   Collection Time   03/05/12  5:45 PM      Component Value Range Comment   Sodium 139  135 - 145 (mEq/L)    Potassium 3.7  3.5 - 5.1 (mEq/L)    Chloride 100  96 - 112 (mEq/L)    CO2 26  19 - 32 (mEq/L)    Glucose, Bld 130 (*) 70 - 99 (mg/dL)    BUN 27 (*) 6 - 23 (mg/dL)    Creatinine, Ser 8.29 (*) 0.50 - 1.10 (mg/dL)    Calcium 56.2  8.4 - 10.5 (mg/dL)    GFR calc non Af Amer 41 (*) >90 (mL/min)    GFR calc Af Amer 48 (*) >90 (mL/min)   POCT I-STAT TROPONIN I     Status: Normal   Collection Time   03/05/12  6:03 PM      Component Value Range Comment   Troponin i, poc 0.00  0.00 - 0.08 (ng/mL)    Comment 3            PRO B NATRIURETIC PEPTIDE     Status: Normal   Collection Time   03/05/12  6:16 PM      Component Value Range Comment   Pro B Natriuretic peptide (  BNP) 20.6  0 - 125 (pg/mL)   GLUCOSE, CAPILLARY     Status: Abnormal   Collection Time   03/05/12  8:41 PM      Component Value Range Comment   Glucose-Capillary 148 (*) 70 - 99 (mg/dL)    Dg Chest 2 View  02/19/8118  *RADIOLOGY REPORT*  Clinical Data: Shortness of breath  CHEST - 2 VIEW  Comparison: 07/22/2004  Findings: Cardiac leads overlie the chest.  Heart size is normal. Lung volumes are low but clear.  No pleural effusion.  No acute osseous finding.  IMPRESSION: No acute cardiopulmonary process.  Original Report Authenticated By: Harrel Lemon, M.D.    Review of Systems  Respiratory: Positive for cough and shortness of breath. Negative for hemoptysis, sputum production and wheezing.   Cardiovascular: Positive for chest pain and claudication. Negative for palpitations, orthopnea, leg swelling and PND.  Musculoskeletal: Positive for myalgias and joint pain.  All other systems reviewed and are negative.    Blood pressure 123/64, pulse 68, temperature 97 F (36.1 C), temperature source Oral, resp. rate 14, SpO2 100.00%. Physical Exam  Constitutional: She is oriented to person, place, and time. She appears well-developed and  well-nourished.  HENT:  Head: Normocephalic and atraumatic.  Right Ear: External ear normal.  Left Ear: External ear normal.  Nose: Nose normal.  Mouth/Throat: Oropharynx is clear and moist.  Eyes: Conjunctivae and EOM are normal. Pupils are equal, round, and reactive to light.  Neck: Normal range of motion. Neck supple.  Cardiovascular: Normal rate, regular rhythm, normal heart sounds and intact distal pulses.   Respiratory: Effort normal and breath sounds normal.  GI: Soft. Bowel sounds are normal.  Musculoskeletal: Normal range of motion.  Neurological: She is alert and oriented to person, place, and time. She has normal reflexes.  Skin: Skin is warm and dry.  Psychiatric: She has a normal mood and affect. Her behavior is normal. Judgment and thought content normal.     Assessment/Plan Assessment this is a 61 year old female with multiple risk factors for coronary artery disease but also decreased mobility with abnormality of gait here with chest pain. She has negative enzymes and also normal EKG. Most likely differentials coronary artery disease or pulmonary embolism. Due to patient's complaint of leg pain and decreased mobility we have to strongly consider PE as a possibility. Plan #1 chest pain: We'll admit the patient to telemetry bed check serial cardiac enzymes echocardiogram continue nitroglycerin aspirin and morphine. Start Lovenox prophylactics dose, check a d-dimer and if this elevated will check CT angiogram. #2 diabetes: Continue his home Lantus dose as well as sliding scale insulin will check hemoglobin A1c #3 hypertension: Continue his home medications and titrate blood pressure medications. Blood pressure seems well controlled at this point #4: Tobacco abuse: We'll start tobacco cessation counseling and offered nicotine patch if needed #5: Hyperlipidemia: Check fasting lipid panel as well as continued statin #6 hypothyroidism: Check a TSH and adjust Synthroid if  needed  Johanna Stafford,LAWAL 03/05/2012, 9:18 PM

## 2012-03-05 NOTE — ED Notes (Signed)
Patient transported to X-ray 

## 2012-03-05 NOTE — ED Provider Notes (Signed)
History     CSN: [id number]  Arrival date & time 03/05/12  1726   First MD Initiated Contact with Patient 03/05/12 1731      Chief Complaint  Patient presents with  . Chest Pain    (Consider location/radiation/quality/duration/timing/severity/associated sxs/prior treatment) Patient is a 61 y.o. female presenting with chest pain. The history is provided by the patient.  Chest Pain The chest pain began 1 - 2 weeks ago. Chest pain occurs constantly. Progression since onset: waxing/waning. Associated with: not exertional or pleuritic. At its most intense, the pain is at 5/10. The pain is currently at 0/10 (no chest pain currently, but still has arm pain). The quality of the pain is described as sharp. The pain radiates to the left arm and left shoulder. Chest pain is worsened by certain positions (lying flat). Primary symptoms include shortness of breath and nausea. Pertinent negatives for primary symptoms include no fever, no fatigue, no cough, no abdominal pain and no vomiting.  The shortness of breath began more than 2 days ago.  Associated symptoms include orthopnea. She tried nitroglycerin and antacids for the symptoms.  Her past medical history is significant for diabetes and hypertension.  Pertinent negatives for past medical history include no CAD and no MI.  Pertinent negatives for family medical history include: no early MI in family.  Procedure history is negative for cardiac catheterization.     Past Medical History  Diagnosis Date  . CVA (cerebral vascular accident) 01/2011    affected rt side  . Diabetes mellitus   . Hypertension     Past Surgical History  Procedure Date  . Tubal ligation   . Cesarean section     No family history on file.  History  Substance Use Topics  . Smoking status: Former Games developer  . Smokeless tobacco: Not on file  . Alcohol Use: No    OB History    Grav Para Term Preterm Abortions TAB SAB Ect Mult Living                  Review  of Systems  Constitutional: Negative for fever and fatigue.  HENT: Negative for neck pain.   Respiratory: Positive for shortness of breath. Negative for cough and chest tightness.   Cardiovascular: Positive for chest pain and orthopnea.  Gastrointestinal: Positive for nausea. Negative for vomiting, abdominal pain and diarrhea.  Genitourinary: Negative for dysuria.  Musculoskeletal: Positive for arthralgias.  Skin: Negative for rash.  Neurological: Negative for headaches.  All other systems reviewed and are negative.    Allergies  Review of patient's allergies indicates no known allergies.  Home Medications   Current Outpatient Rx  Name Route Sig Dispense Refill  . AMLODIPINE BESYLATE 5 MG PO TABS Oral Take 5 mg by mouth daily.    . ASPIRIN EC 81 MG PO TBEC Oral Take 81 mg by mouth daily.    Marland Kitchen CALTRATE 600 PO Oral Take 1 tablet by mouth daily.    Marland Kitchen CLOPIDOGREL BISULFATE 75 MG PO TABS Oral Take 75 mg by mouth daily.    Marland Kitchen FERROUS SULFATE 325 (65 FE) MG PO TABS Oral Take 1 tablet (325 mg total) by mouth daily. 30 tablet 0  . GEMFIBROZIL 600 MG PO TABS Oral Take 600 mg by mouth 2 (two) times daily before a meal.    . GLIMEPIRIDE 2 MG PO TABS Oral Take 2 mg by mouth 2 (two) times daily.    . INSULIN GLARGINE 100 UNIT/ML [place] SOLN Subcutaneous  Inject 25 Units into the skin at bedtime.    Marland Kitchen LEVOTHYROXINE SODIUM 75 MCG PO TABS Oral Take 75 mcg by mouth daily.    Marland Kitchen LISINOPRIL-HYDROCHLOROTHIAZIDE 10-12.5 MG PO TABS Oral Take 1 tablet by mouth daily.    . ADULT MULTIVITAMIN W/MINERALS CH Oral Take 1 tablet by mouth daily.    Marland Kitchen PRAVASTATIN SODIUM 20 MG PO TABS Oral Take 20 mg by mouth daily.    Marland Kitchen ROPINIROLE HCL 0.25 MG PO TABS Oral Take 0.5 mg by mouth at bedtime.    . TRAMADOL HCL 50 MG PO TABS Oral Take 50 mg by mouth every 8 (eight) hours as needed. Pain.      BP 129/70  Pulse 65  Temp(Src) 97 F (36.1 C) (Oral)  Resp 20  SpO2 97%  Physical Exam  Nursing note and vitals  reviewed. Constitutional: She is oriented to person, place, and time. She appears well-developed and well-nourished.  HENT:  Head: Normocephalic and atraumatic.  Eyes: EOM are normal.  Neck: Normal range of motion.  Cardiovascular: Normal rate, regular rhythm and normal heart sounds.   Pulmonary/Chest: Effort normal and breath sounds normal. No respiratory distress. She exhibits no tenderness.  Abdominal: Soft. There is no tenderness.  Musculoskeletal: Normal range of motion. She exhibits no edema.       Left upper arm: She exhibits tenderness. She exhibits no swelling, no edema and no deformity.  Neurological: She is alert and oriented to person, place, and time.  Skin: Skin is warm and dry.  Psychiatric: She has a normal mood and affect.    ED Course  Procedures (including critical care time)  Date: 03/05/2012  Rate: 59  Rhythm: normal sinus rhythm  QRS Axis: normal  Intervals: PR prolonged  ST/T Wave abnormalities: nonspecific T wave changes  Conduction Disutrbances:none  Narrative Interpretation:   Old EKG Reviewed: unchanged    Labs Reviewed  CBC  BASIC METABOLIC PANEL   Glucose, capillary (Final result)  Abnormal  Component (Lab Inquiry)      Result Time  Glucose-Capillary    03/05/12 2039  148 (H)         Pro b natriuretic peptide (Final result)   Component (Lab Inquiry)      Result Time  Pro B Natriuretic peptide (BNP)    03/05/12 1905  20.6         Basic metabolic panel (Final result)  Abnormal  Component (Lab Inquiry)      Result Time  NA  K  CL  CO2  GLUCOSE    03/05/12 1837  139  3.7  100  26  130 (H)           Result Time  BUN  Creatinine, Ser  CALCIUM  GFR calc non Af Amer  GFR calc Af Amer    03/05/12 1837  27 (H)  1.37 (H)  10.5  41 (L)  48 (L) The eGFR has been calculated using the CKD EPI equation. This calculation has not been validated in all clinical situations. eGFR's persistently <90 mL/min signify possible Chronic Kidney Disease.          POCT i-Stat troponin I (Final result)   Component (Lab Inquiry)      Result Time  Troponin i, poc  Comment 3    03/05/12 1810  0.00   Due to the release kinetics of cTnI, a negative result within the first hours of the onset of symptoms does not rule out myocardial infarction with certainty.  If myocardial infarction is still suspected, repeat the test at appropriate intervals.         CBC (Final result)  Abnormal  Component (Lab Inquiry)      Result Time  WBC  RBC  HGB  HCT  MCV    03/05/12 1805  5.1  3.58 (L)  10.6 (L)  32.1 (L)  89.7           Result Time  MCH  MCHC  RDW  PLT    03/05/12 1805  29.6  33.0  14.4  262        Dg Chest 2 View  03/05/2012  *RADIOLOGY REPORT*  Clinical Data: Shortness of breath  CHEST - 2 VIEW  Comparison: 07/22/2004  Findings: Cardiac leads overlie the chest.  Heart size is normal. Lung volumes are low but clear.  No pleural effusion.  No acute osseous finding.  IMPRESSION: No acute cardiopulmonary process.  Original Report Authenticated By: Harrel Lemon, M.D.     1. Chest pain       MDM  Patient presents with chest pain over last week. She reports to me has been going on for one week, but per nursing notes she indicates nearly 10 days. She does report that pain has been constant but not always located in her chest. Sometimes the pain is in her arm radiating to her chest. Sometimes it is in her chest radiating to her arm. She describes it as sharp. She has had no time when she has been fully pain free. Only aggravating factors lying flat with which she associated shortness of breath. Only alleviating factor is sitting up. She does report some intermittent diaphoresis it is nonexertional. She also has exertional shortness of breath.  She was seen at her PCP earlier today who is concerned about her story and a sitter here for further evaluation. They're also concerned about nonspecific T wave changes in the lateral leads which per review of old  EKG is unchanged.  Patient does have multiple cardiac risk factors include hypertension diabetes and previous stroke. Her story however is atypical for coronary ischemia. He does have questionable dyspnea on exertion as anginal equivalent.  Evaluation here patient does have reproducible tenderness of her left arm. There is no swelling deformity or signs of trauma. She has full range of motion.  Workup here with a normal troponin negative chest x-ray. Her history is atypical but she has numerous risk factors and there is concerned of her exertional shortness of breath as anginal equivalent. Given this to be admitted to hospitalist for further evaluation.      Donnamarie Poag, MD 03/06/12 [phone]

## 2012-03-05 NOTE — ED Notes (Signed)
2003-01 Ready 

## 2012-03-05 NOTE — ED Notes (Signed)
CALLED FLOOR TO GIVE REPORT , NURSE NOT READY AT THIS TIME.  

## 2012-03-05 NOTE — ED Notes (Addendum)
MD at bedside. Pt states left arm pain radiating to shoulder is 5:10 that started about a week ago. States pain is worse when sitting. Pt states is in bicep area of left arm when ask by MD, area tender to touch.

## 2012-03-05 NOTE — ED Notes (Signed)
Pt back from XR 

## 2012-03-05 NOTE — ED Notes (Addendum)
Patient reports intermittent chest pain since 14th,  She was seen at [facility] for same and dx with gastritis.   Patient has mid sternal to left sided chest pain.  She was seen at the md office today and was sent to ED by her MD for further eval, the patient has received aspirin 324 and nitro x 2

## 2012-03-05 NOTE — ED Notes (Signed)
cbg per ems was 156

## 2012-03-06 ENCOUNTER — Encounter (HOSPITAL_COMMUNITY): Payer: Self-pay | Admitting: Dietician

## 2012-03-06 ENCOUNTER — Observation Stay (HOSPITAL_COMMUNITY): Payer: Medicaid Other

## 2012-03-06 DIAGNOSIS — M79609 Pain in unspecified limb: Secondary | ICD-10-CM

## 2012-03-06 LAB — CBC
MCV: 90 fL (ref 78.0–100.0)
Platelets: 250 10*3/uL (ref 150–400)
RBC: 3.21 MIL/uL — ABNORMAL LOW (ref 3.87–5.11)
WBC: 4.9 10*3/uL (ref 4.0–10.5)

## 2012-03-06 LAB — GLUCOSE, CAPILLARY
Glucose-Capillary: 236 mg/dL — ABNORMAL HIGH (ref 70–99)
Glucose-Capillary: 107 mg/dL — ABNORMAL HIGH (ref 70–99)
Glucose-Capillary: 139 mg/dL — ABNORMAL HIGH (ref 70–99)

## 2012-03-06 LAB — CARDIAC PANEL(CRET KIN+CKTOT+MB+TROPI)
Relative Index: 1.2 (ref 0.0–2.5)
Total CK: 211 U/L — ABNORMAL HIGH (ref 7–177)

## 2012-03-06 LAB — BASIC METABOLIC PANEL
CO2: 24 mEq/L (ref 19–32)
Calcium: 9.5 mg/dL (ref 8.4–10.5)
GFR calc non Af Amer: 37 mL/min — ABNORMAL LOW (ref >90)
Sodium: 135 mEq/L (ref 135–145)

## 2012-03-06 LAB — T4, FREE: Free T4: 0.72 ng/dL — ABNORMAL LOW (ref 0.80–1.80)

## 2012-03-06 LAB — T3, FREE: T3, Free: 1.6 pg/mL — ABNORMAL LOW (ref 2.3–4.2)

## 2012-03-06 MED ORDER — FENOFIBRATE 54 MG PO TABS
54.0000 mg | ORAL_TABLET | Freq: Every day | ORAL | Status: DC
  Administered 2012-03-06 – 2012-03-09 (×4): 54 mg via ORAL
  Filled 2012-03-06 (×5): qty 1

## 2012-03-06 MED ORDER — TECHNETIUM TO 99M ALBUMIN AGGREGATED
3.0000 | Freq: Once | INTRAVENOUS | Status: AC | PRN
  Administered 2012-03-06: 3 via INTRAVENOUS

## 2012-03-06 MED ORDER — NITROGLYCERIN 0.4 MG SL SUBL: 0.4000 mg | SUBLINGUAL_TABLET | SUBLINGUAL | Status: DC | PRN

## 2012-03-06 MED ORDER — ROSUVASTATIN CALCIUM 5 MG PO TABS
5.0000 mg | ORAL_TABLET | Freq: Every day | ORAL | Status: DC
  Administered 2012-03-06 – 2012-03-08 (×3): 5 mg via ORAL
  Filled 2012-03-06 (×4): qty 1

## 2012-03-06 NOTE — Progress Notes (Signed)
*  PRELIMINARY RESULTS* Vascular Ultrasound Lower extremity venous duplex has been completed.  Preliminary findings: Bilaterally no evidence of DVT or baker's cyst.  Farrel Demark, RDMS 03/06/2012, 5:25 PM

## 2012-03-06 NOTE — Progress Notes (Addendum)
Subjective: Denies chest pain. She has lower extremities pain, ? Weakness on ambulation.  She relates chest pain, burning, sharp. Worse with palpation.  Relates sob ambulation with leg cramping.  She has chronic neck pain, stiffness after stroke, not worse.   Objective: Filed Vitals:   03/05/12 2034 03/05/12 2231 03/06/12 0627 03/06/12 1502  BP: 123/64 147/69 152/73 147/64  Pulse: 68 60 62 62  Temp:  98.4 F (36.9 C) 98.4 F (36.9 C) 98 F (36.7 C)  TempSrc:  Oral Oral Oral  Resp: 14 18 17 18   Height:  5\' 2"  (1.575 m)    Weight:  77.9 kg (171 lb 11.8 oz) 78.6 kg (173 lb 4.5 oz)   SpO2: 100% 95% 93% 97%   Weight change:   Intake/Output Summary (Last 24 hours) at 03/06/12 1606 Last data filed at 03/05/12 2300  Gross per 24 hour  Intake      3 ml  Output      0 ml  Net      3 ml    General: Alert, awake, oriented x3, in no acute distress.  HEENT: No bruits, no goiter.  Heart: Regular rate and rhythm, without murmurs, rubs, gallops.  Lungs: CTA, bilateral air movement.  Abdomen: Soft, nontender, nondistended, positive bowel sounds.  Neuro: Grossly intact, nonfocal. Extremities no edema.   Lab Results:  [facility] 03/06/12 0525 03/05/12 2154 03/05/12 1745  NA 135 -- 139  K 3.8 -- 3.7  CL 99 -- 100  CO2 24 -- 26  GLUCOSE 238* -- 130*  BUN 29* -- 27*  CREATININE 1.50* 1.40* --  CALCIUM 9.5 -- 10.5  MG -- -- --  PHOS -- -- --    Basename 03/06/12 0525 03/05/12 2154  WBC 4.9 5.1  NEUTROABS -- --  HGB 9.5* 10.5*  HCT 28.9* 32.1*  MCV 90.0 89.4  PLT 250 275    Basename 03/06/12 1330 03/06/12 0525 03/05/12 2212  CKTOTAL 221* 211* 248*  CKMB 2.7 2.6 2.9  CKMBINDEX -- -- --  TROPONINI <0.30 <0.30 <0.30    Basename 03/05/12 2154  DDIMER 16.20*    Basename 03/05/12 2213  CHOL 223*  HDL 44  LDLCALC 143*  TRIG 180*  CHOLHDL 5.1  LDLDIRECT --    Basename 03/05/12 2154  TSH 39.436*  T4TOTAL --  T3FREE --  THYROIDAB --    Studies/Results: Dg Chest  2 View  03/05/2012  *RADIOLOGY REPORT*  Clinical Data: Shortness of breath  CHEST - 2 VIEW  Comparison: 07/22/2004  Findings: Cardiac leads overlie the chest.  Heart size is normal. Lung volumes are low but clear.  No pleural effusion.  No acute osseous finding.  IMPRESSION: No acute cardiopulmonary process.  Original Report Authenticated By: Harrel Lemon, M.D.    Medications: I have reviewed the patient's current medications.   Patient Active Hospital Problem List:  Chest pain (03/05/2012) Atypical, cardiac enzymes times 3 negative.  D dimer increase, V- scan result pending.  ECHO pending.   HYPOTHYROIDISM, UNSPECIFIED (01/10/2007) Increase TSH, continue with same dose synthroid. I will check free T 3 and T 4. She relates that she has been taking her medications.   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  HYPERCHOLESTEROLEMIA (01/10/2007) Continue with lopid.  HYPERTENSION, BENIGN SYSTEMIC (01/10/2007) Continue with Norvasc  Dyspnea on exertion (03/05/2012) BNP normal, ECHO pending.   Increase D dimer: check lower extremities doppler. V- Q scan pending.   Renal insufficiency: IV fluids. Mild increase  cr.      LOS: 1 day   Eletha Culbertson M.D.  [facility] 03/06/2012, 4:06 PM  VQ scan very low probability for PE.

## 2012-03-06 NOTE — Progress Notes (Signed)
Inpatient Diabetes Program Recommendations  AACE/ADA: New Consensus Statement on Inpatient Glycemic Control (2009)  Target Ranges:  Prepandial:   less than 140 mg/dL      Peak postprandial:   less than 180 mg/dL (1-2 hours)      Critically ill patients:  140 - 180 mg/dL   Reason for Visit: Results for DIVA, LEMBERGER (MRN [id number]) as of 03/06/2012 15:24  Ref. Range 03/05/2012 20:41 03/05/2012 22:28 03/06/2012 06:21 03/06/2012 11:40  Glucose-Capillary Latest Range: 70-99 mg/dL 119 (H) 147 (H) 829 (H) 236 (H)    Inpatient Diabetes Program Recommendations Insulin - Meal Coverage: Add Novolog meal coverage 4 units tid with meals (hold if patient eats less than 50%). HgbA1C: A1C in process.  Note: Will follow.

## 2012-03-06 NOTE — Progress Notes (Signed)
Utilization Review Completed.[name] T4/24/2013   

## 2012-03-06 NOTE — Consult Note (Signed)
Pt has been quit for about a year and says she's still maintenance free. Congratulated and encouraged pt to remain tobacco free. Discussed relapse prevention strategies. Pt askd me about e-cigarettes and we discussed this topic at length . Discouraged pt from using it. Again Referred pt to 1-800 quit now for f/u and support. Discussed oral fixation substitutes, second hand smoke and in home smoking policy. Reviewed and gave pt Written education/contact information.

## 2012-03-06 NOTE — ED Provider Notes (Signed)
I saw and evaluated the patient, reviewed the resident's note and I agree with the findings and plan.  Intermittent L sided chest pain for past week.  Radiates into arm, somewhat reproducible. Associated with SOB, better with sitting up. Atypical for ischemia but multiple risk factors. TIMI 2.   Glynn Octave, MD 03/06/12 [phone]

## 2012-03-07 DIAGNOSIS — I70219 Atherosclerosis of native arteries of extremities with intermittent claudication, unspecified extremity: Secondary | ICD-10-CM

## 2012-03-07 LAB — GLUCOSE, CAPILLARY: Glucose-Capillary: 157 mg/dL — ABNORMAL HIGH (ref 70–99)

## 2012-03-07 LAB — BASIC METABOLIC PANEL
Calcium: 9.7 mg/dL (ref 8.4–10.5)
Chloride: 102 mEq/L (ref 96–112)
Creatinine, Ser: 1.28 mg/dL — ABNORMAL HIGH (ref 0.50–1.10)
GFR calc Af Amer: 52 mL/min — ABNORMAL LOW (ref >90)
GFR calc non Af Amer: 45 mL/min — ABNORMAL LOW (ref >90)

## 2012-03-07 MED ORDER — PANTOPRAZOLE SODIUM 40 MG PO TBEC: 40.0000 mg | DELAYED_RELEASE_TABLET | Freq: Every day | ORAL | Status: DC

## 2012-03-07 MED ORDER — ASPIRIN EC 325 MG PO TBEC
325.0000 mg | DELAYED_RELEASE_TABLET | Freq: Every day | ORAL | Status: DC
  Administered 2012-03-09: 325 mg via ORAL
  Filled 2012-03-07: qty 1

## 2012-03-07 MED ORDER — ISOSORBIDE MONONITRATE ER 60 MG PO TB24
60.0000 mg | ORAL_TABLET | Freq: Every day | ORAL | Status: DC
  Administered 2012-03-07 – 2012-03-09 (×3): 60 mg via ORAL
  Filled 2012-03-07 (×3): qty 1

## 2012-03-07 MED ORDER — SODIUM CHLORIDE 0.9 % IJ SOLN: 3.0000 mL | INTRAMUSCULAR | Status: DC | PRN

## 2012-03-07 MED ORDER — SODIUM CHLORIDE 0.9 % IJ SOLN: 3.0000 mL | Freq: Two times a day (BID) | INTRAMUSCULAR | Status: DC

## 2012-03-07 MED ORDER — LEVOTHYROXINE SODIUM 125 MCG PO TABS
125.0000 ug | ORAL_TABLET | Freq: Every day | ORAL | Status: DC
  Administered 2012-03-08 – 2012-03-09 (×2): 125 ug via ORAL
  Filled 2012-03-07 (×3): qty 1

## 2012-03-07 MED ORDER — DIPHENHYDRAMINE HCL 25 MG PO CAPS
25.0000 mg | ORAL_CAPSULE | Freq: Four times a day (QID) | ORAL | Status: DC | PRN
  Administered 2012-03-07: 25 mg via ORAL
  Filled 2012-03-07: qty 1

## 2012-03-07 MED ORDER — SODIUM CHLORIDE 0.9 % IV SOLN
1.0000 mL/kg/h | INTRAVENOUS | Status: DC
  Administered 2012-03-07 – 2012-03-08 (×2): 1 mL/kg/h via INTRAVENOUS

## 2012-03-07 MED ORDER — SODIUM CHLORIDE 0.9 % IV SOLN: 250.0000 mL | INTRAVENOUS | Status: DC | PRN

## 2012-03-07 MED ORDER — ASPIRIN 81 MG PO CHEW
324.0000 mg | CHEWABLE_TABLET | ORAL | Status: AC
  Administered 2012-03-08: 324 mg via ORAL
  Filled 2012-03-07: qty 4

## 2012-03-07 MED ORDER — FENOFIBRATE 54 MG PO TABS: 54.0000 mg | ORAL_TABLET | Freq: Every day | ORAL | Status: DC

## 2012-03-07 MED ORDER — ROPINIROLE HCL 0.25 MG PO TABS: 0.5000 mg | ORAL_TABLET | Freq: Every day | ORAL | Status: DC

## 2012-03-07 NOTE — Progress Notes (Signed)
Utilization Review Completed.[name] T4/25/2013   

## 2012-03-07 NOTE — Progress Notes (Signed)
VASCULAR LAB PRELIMINARY  ARTERIAL  ABI completed: Right ABI indicates mild to moderate reduction in arterial flow at rest.  Left ABI indicates moderate to severe reduction in arterial flow at rest.    RIGHT    LEFT    PRESSURE WAVEFORM  PRESSURE WAVEFORM  BRACHIAL 140  T BRACHIAL    DP 98 M DP 61 M  AT   AT    PT 105 DM PT 76  DM  PER   PER    GREAT TOE  NA GREAT TOE  NA    RIGHT LEFT  ABI 0.75 0.54     [name], 03/07/2012, 3:33 PM

## 2012-03-07 NOTE — Progress Notes (Signed)
  Echocardiogram 2D Echocardiogram has been performed.  Cathie Beams Deneen 03/07/2012, 4:20 PM

## 2012-03-07 NOTE — Progress Notes (Signed)
Subjective: Patient feeling better, no chest pain. Pain leg better.  Objective: Filed Vitals:   03/07/12 0602 03/07/12 0641 03/07/12 0918 03/07/12 1414  BP: 177/72 158/80  137/69  Pulse: 62  67 69  Temp: 97.8 F (36.6 C)   98.4 F (36.9 C)  TempSrc: Oral   Oral  Resp: 18   20  Height:      Weight: 78.472 kg (173 lb)     SpO2: 93%  97% 96%   Weight change: 0.572 kg (1 lb 4.2 oz)  Intake/Output Summary (Last 24 hours) at 03/07/12 1608 Last data filed at 03/07/12 0900  Gross per 24 hour  Intake    480 ml  Output   4400 ml  Net  -3920 ml    General: Alert, awake, oriented x3, in no acute distress.  HEENT: No bruits, no goiter.  Heart: Regular rate and rhythm, without murmurs, rubs, gallops.  Lungs: CTA, bilateral air movement.  Abdomen: Soft, nontender, nondistended, positive bowel sounds.  Neuro: Grossly intact, nonfocal. Extremities; no edema.   Lab Results:  [facility] 03/07/12 0700 03/06/12 0525  NA 139 135  K 3.6 3.8  CL 102 99  CO2 26 24  GLUCOSE 137* 238*  BUN 19 29*  CREATININE 1.28* 1.50*  CALCIUM 9.7 9.5  MG -- --  PHOS -- --   Basename 03/06/12 0525 03/05/12 2154  WBC 4.9 5.1  NEUTROABS -- --  HGB 9.5* 10.5*  HCT 28.9* 32.1*  MCV 90.0 89.4  PLT 250 275    Basename 03/06/12 1330 03/06/12 0525 03/05/12 2212  CKTOTAL 221* 211* 248*  CKMB 2.7 2.6 2.9  CKMBINDEX -- -- --  TROPONINI <0.30 <0.30 <0.30    Basename 03/05/12 2154  DDIMER 16.20*    Basename 03/05/12 2154  HGBA1C 10.5*    Basename 03/05/12 2213  CHOL 223*  HDL 44  LDLCALC 143*  TRIG 180*  CHOLHDL 5.1  LDLDIRECT --    Basename 03/06/12 1605 03/05/12 2154  TSH -- 39.436*  T4TOTAL -- --  T3FREE 1.6* --  THYROIDAB -- --    Micro Results: No results found for this or any previous visit (from the past 240 hour(s)).  Studies/Results: Dg Chest 2 View  03/05/2012  *RADIOLOGY REPORT*  Clinical Data: Shortness of breath  CHEST - 2 VIEW  Comparison: 07/22/2004  Findings:  Cardiac leads overlie the chest.  Heart size is normal. Lung volumes are low but clear.  No pleural effusion.  No acute osseous finding.  IMPRESSION: No acute cardiopulmonary process.  Original Report Authenticated By: Harrel Lemon, M.D.   Nm Pulmonary Perfusion  03/06/2012  *RADIOLOGY REPORT*  Clinical Data:  Elevated D-dimer, concern pulmonary embolism.   NUCLEAR MEDICINE VENTILATION - PERFUSION LUNG SCAN  Technique:    Perfusion images were obtained in multiple projections after intravenous injection of Tc-84m MAA.  Radiopharmaceuticals:  3.0 mCi Tc-61m MAA.  Comparison: Chest radiograph 03/05/2012  Findings: .  Perfusion:  No wedge shaped peripheral perfusion defects to suggest acute pulmonary embolism  IMPRESSION:  Very low probability for acute pulmonary embolism.  Original Report Authenticated By: Genevive Bi, M.D.    Medications: I have reviewed the patient's current medications.  Chest pain (03/05/2012)  cardiac enzymes times 3 negative.  D dimer increase, V- scan result low probability for PE.  ECHO pending.  Agree with Dr Katrinka Blazing with inpatient evaluation, appreciate his help.   HYPOTHYROIDISM, UNSPECIFIED (01/10/2007) Increase TSH , low free T 3 : 1.6 and T 4 :  0.72.  She relates that she has been taking her medications. I will increase synthroid. She will need repeated function test in 4 weeks.   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 HBA 1c at 10. Wonder if she is really taking her medications.  Diabetes education.   HYPERCHOLESTEROLEMIA (01/10/2007) Continue with fenofibrate, statins.   HYPERTENSION, BENIGN SYSTEMIC (01/10/2007) Continue with Norvasc   Dyspnea on exertion (03/05/2012)  BNP normal, ECHO pending.  Angina? Cardiac evaluation inpatient.   Increase D dimer: check lower extremities doppler. V- Q scan pending.  Renal insufficiency: IV fluids. Renal function improving. Lower extremities pain: ABI  preliminary: Right ABI indicates mild to moderate reduction in arterial flow at rest. Left ABI indicates moderate to severe reduction in arterial flow at rest. Cardio following.      LOS: 2 days   Sten Dematteo M.D.  [facility] 03/07/2012, 4:08 PM

## 2012-03-07 NOTE — Consult Note (Signed)
Admit date: 03/05/2012 Referring Physician  no physician listed Primary Physician  [facility] Primary Cardiologist  Verdis Prime M.D. Reason for Consultation  chest discomfort  ASSESSMENT: 1. Exertional angina likely due to obstructive coronary disease  2. Claudication, left leg greater than right  3. Left carotid bruit  4. Prior CVA. Intracranial and carotid atherosclerosis demonstrated by MRA in 2012.  5. Diabetes mellitus with poor control, hemoglobin A1c greater than 10  6. Hypertension  PLAN:  1. The patient is at risk for obstructive coronary disease. I would favor in hospital evaluation with either coronary angiography or at least a pharmacologic nuclear study. I am concerned about followup as an outpatient. Coronary angiography would probably be the most direct way to evaluate the patient. I will speak with Dr. Sunnie Nielsen  2. Consider lower extremity Doppler study  3. Aggressive risk factor modification with her primary physician and coordination of care.  4. Start long-acting nitrate therapy.    HPI:  the patient has difficulty giving a history that he is interpretable. It seems to me that exertion causes chest discomfort that she describes as a pinching type discomfort in the middle part of her chest and dyspnea. This is been progressive over a month. Additionally she has begun having severe left lower extremity discomfort with activity. It goes away with rest. She denies orthopnea. She denies chest pain at rest. She has not had palpitations.   PMH:   Past Medical History  Diagnosis Date  . CVA (cerebral vascular accident) 01/2011    affected rt side  . Diabetes mellitus   . Hypertension      PSH:   Past Surgical History  Procedure Date  . Tubal ligation   . Cesarean section     Allergies:  Review of patient's allergies indicates no known allergies. Prior to Admit Meds:   Prescriptions prior to admission  Medication Sig Dispense Refill  . amLODipine  (NORVASC) 5 MG tablet Take 5 mg by mouth every morning.       Marland Kitchen aspirin EC 81 MG tablet Take 81 mg by mouth every morning.       . Calcium Carbonate-Vitamin D (CALCIUM 600+D) 600-400 MG-UNIT per tablet Take 1 tablet by mouth every morning.      . clopidogrel (PLAVIX) 75 MG tablet Take 75 mg by mouth every morning.       . ferrous sulfate 325 (65 FE) MG tablet Take 325 mg by mouth every morning.      Marland Kitchen glimepiride (AMARYL) 2 MG tablet Take 2 mg by mouth 2 (two) times daily.      . insulin glargine (LANTUS) 100 UNIT/ML injection Inject 25 Units into the skin at bedtime.      Marland Kitchen levothyroxine (SYNTHROID, LEVOTHROID) 75 MCG tablet Take 75 mcg by mouth at bedtime.       Marland Kitchen lisinopril-hydrochlorothiazide (PRINZIDE,ZESTORETIC) 10-12.5 MG per tablet Take 1 tablet by mouth every morning.       . Multiple Vitamin (MULITIVITAMIN WITH MINERALS) TABS Take 1 tablet by mouth every morning.      . pravastatin (PRAVACHOL) 20 MG tablet Take 20 mg by mouth every morning.       Marland Kitchen DISCONTD: gemfibrozil (LOPID) 600 MG tablet Take 600 mg by mouth 2 (two) times daily before a meal.      . DISCONTD: rOPINIRole (REQUIP) 0.25 MG tablet Take 0.5 mg by mouth at bedtime.       Fam HX:   History reviewed. No pertinent family history.  Social HX:    History   Social History  . Marital Status: Single    Spouse Name: N/A    Number of Children: N/A  . Years of Education: N/A   Occupational History  . Not on file.   Social History Main Topics  . Smoking status: Former Smoker    Quit date: 02/04/2011  . Smokeless tobacco: Not on file  . Alcohol Use: No  . Drug Use: No  . Sexually Active: Not on file   Other Topics Concern  . Not on file   Social History Narrative  . No narrative on file     Review of Systems: The patient denies intestinal bleeding. She has no recollection of having anemia. She states she has had 2 strokes in the past. She stopped smoking cigarettes one year ago. Her appetite is been  stable.  Physical Exam: Blood pressure 158/80, pulse 67, temperature 97.8 F (36.6 C), temperature source Oral, resp. rate 18, height 5\' 2"  (1.575 m), weight 78.472 kg (173 lb), SpO2 97.00%. Weight change: 0.572 kg (1 lb 4.2 oz)  She is in no distress. She is sitting comfortably in a chair.  Left carotid bruit is heard.  Lungs are clear anteriorly and posteriorly. Cardiac exam feels an S4 gallop is otherwise unremarkable. Abdomen is obese. No obvious bruits are heard. The extremities reveal no edema. I am unable to palpate pulses in either foot. Neurologically the patient is weak on the left side. Her speech is somewhat impaired. Labs:   Lab Results  Component Value Date   WBC 4.9 03/06/2012   HGB 9.5* 03/06/2012   HCT 28.9* 03/06/2012   MCV 90.0 03/06/2012   PLT 250 03/06/2012    Lab 03/07/12 0700  NA 139  K 3.6  CL 102  CO2 26  BUN 19  CREATININE 1.28*  CALCIUM 9.7  PROT --  BILITOT --  ALKPHOS --  ALT --  AST --  GLUCOSE 137*   No results found for this basename: PTT   Lab Results  Component Value Date   INR 0.90 01/27/2011   Lab Results  Component Value Date   CKTOTAL 221* 03/06/2012   CKMB 2.7 03/06/2012   TROPONINI <0.30 03/06/2012     Lab Results  Component Value Date   CHOL 223* 03/05/2012   CHOL  Value: 170        ATP III CLASSIFICATION:  <200     mg/dL   Desirable  454-098  mg/dL   Borderline High  >=119    mg/dL   High        1/47/8295   CHOL 183 01/04/2010   Lab Results  Component Value Date   HDL 44 03/05/2012   HDL 30* 01/28/2011   HDL 42 01/04/2010   Lab Results  Component Value Date   LDLCALC 143* 03/05/2012   LDLCALC  Value: UNABLE TO CALCULATE IF TRIGLYCERIDE OVER 400 mg/dL        Total Cholesterol/HDL:CHD Risk Coronary Heart Disease Risk Table                     Men   Women  1/2 Average Risk   3.4   3.3  Average Risk       5.0   4.4  2 X Average Risk   9.6   7.1  3 X Average Risk  23.4   11.0        Use the calculated Patient Ratio above and the  CHD  Risk Table to determine the patient's CHD Risk.        ATP III CLASSIFICATION (LDL):  <100     mg/dL   Optimal  440-102  mg/dL   Near or Above                    Optimal  130-159  mg/dL   Borderline  725-366  mg/dL   High  >440     mg/dL   Very High 3/47/4259   LDLCALC 65 01/04/2010   Lab Results  Component Value Date   TRIG 180* 03/05/2012   TRIG 431* 01/28/2011   TRIG 382* 01/04/2010   Lab Results  Component Value Date   CHOLHDL 5.1 03/05/2012   CHOLHDL 5.7 01/28/2011   CHOLHDL 4.4 Ratio 01/04/2010   No results found for this basename: LDLDIRECT      Radiology:  Dg Chest 2 View  03/05/2012  *RADIOLOGY REPORT*  Clinical Data: Shortness of breath  CHEST - 2 VIEW  Comparison: 07/22/2004  Findings: Cardiac leads overlie the chest.  Heart size is normal. Lung volumes are low but clear.  No pleural effusion.  No acute osseous finding.  IMPRESSION: No acute cardiopulmonary process.  Original Report Authenticated By: Harrel Lemon, M.D.   [facility]m Pulmonary Perfusion  03/06/2012  *RADIOLOGY REPORT*  Clinical Data:  Elevated D-dimer, concern pulmonary embolism.   NUCLEAR MEDICINE VENTILATION - PERFUSION LUNG SCAN  Technique:    Perfusion images were obtained in multiple projections after intravenous injection of Tc-39m MAA.  Radiopharmaceuticals:  3.0 mCi Tc-70m MAA.  Comparison: Chest radiograph 03/05/2012  Findings: .  Perfusion:  No wedge shaped peripheral perfusion defects to suggest acute pulmonary embolism  IMPRESSION:  Very low probability for acute pulmonary embolism.  Original Report Authenticated By: Genevive Bi, M.D.   EKG:  Normal sinus rhythm with ST-T wave abnormality and prominent voltage.     Lesleigh Noe 03/07/2012 1:30 PM

## 2012-03-07 NOTE — Progress Notes (Addendum)
Patient seen and examined. She denies chest pain. No chest pain or dyspnea on exertion. Leg pain better. No weakness on ambulation. ABI pending , ECHO pending. Dr Katrinka Blazing will evaluate patient. Depending on his recommendation will discharge later today. VQ scan low probability for PE, doppler lower extremities negative.  Raahil Ong,Md.

## 2012-03-07 NOTE — Progress Notes (Signed)
Inpatient Diabetes Program Recommendations  AACE/ADA: New Consensus Statement on Inpatient Glycemic Control (2009)  Target Ranges:  Prepandial:   less than 140 mg/dL      Peak postprandial:   less than 180 mg/dL (1-2 hours)      Critically ill patients:  140 - 180 mg/dL    Inpatient Diabetes Program Recommendations Insulin - Meal Coverage: Add Novolog meal coverage 4 units tid with meals (hold if patient eats less than 50%). HgbA1C: =10.5    Note: Patient will need follow-up with primary MD after discharge for glycemic management.  A1C= 10.5    Thank you  Piedad Climes [facility] Inpatient Diabetes Coordinator [phone]

## 2012-03-07 NOTE — Evaluation (Signed)
Occupational Therapy Evaluation Patient Details Name: [name] MRN: [id number] DOB: 03/28/1951 Today's Date: 03/07/2012 Time:  -     OT Assessment / Plan / Recommendation Clinical Impression  Pt admitted for chest pain and LUE and LLE pain decreasing independence with ambulation/ADLs. Pt continues to c/o of LLE pain/"cramping" but states LUE and chest pain have resolved. Pt is at baseline functiong with no further acute OT needs at this time. Will sign off.    OT Assessment  Patient does not need any further OT services    Follow Up Recommendations  No OT follow up    Equipment Recommendations  None recommended by PT    Frequency      Precautions / Restrictions Precautions Precautions: Fall Restrictions Weight Bearing Restrictions: No   Pertinent Vitals/Pain C/o pain in LLE- describes as "cramping" Declines need for pain medication. Repositioned.    ADL  Eating/Feeding: Simulated;Independent Where Assessed - Eating/Feeding: Edge of bed Grooming: Performed;Wash/dry hands;Independent Where Assessed - Grooming: Standing at sink Upper Body Bathing: Simulated;Modified independent Where Assessed - Upper Body Bathing:  (standing- occasional use of wall to maintain balance) Lower Body Bathing: Simulated;Modified independent Where Assessed - Lower Body Bathing:  (standing- occasional use of wall to maintain balance) Upper Body Dressing: Simulated;Independent Where Assessed - Upper Body Dressing: Sitting, bed Lower Body Dressing: Performed;Independent (don/doff socks. don/doff shoes ) Where Assessed - Lower Body Dressing: Sitting, bed Toilet Transfer: Performed;Independent Toilet Transfer Method: Proofreader: Regular height toilet;Grab bars Toileting - Clothing Manipulation: Performed;Independent Where Assessed - Toileting Clothing Manipulation: Standing Toileting - Hygiene: Simulated;Independent Where Assessed - Toileting Hygiene: Sit on 3-in-1 or  toilet Tub/Shower Transfer: Simulated;Supervision/safety Tub/Shower Transfer Method: Science writer: Walk in shower (hold to wall) Ambulation Related to ADLs: Mod I (pt holding to IV pole) ADL Comments: Pt at baseline functioning. Encourage increase in physical activity with use of cane, especially for ambulation longer distances    OT Goals    Visit Information  Last OT Received On: 03/07/12 Assistance Needed: +1 PT/OT Co-Evaluation/Treatment: Yes    Subjective Data  Subjective: This left leg is still bothering me- feels like it's cramping. Patient Stated Goal: Return home with daughter   Prior Functioning  Home Living Lives With: Daughter Available Help at Discharge: Available PRN/intermittently Type of Home: House Home Access: Stairs to enter Secretary/administrator of Steps: 3 Entrance Stairs-Rails: Can reach both;Left;Right Home Layout: One level Bathroom Shower/Tub: Engineer, manufacturing systems: Standard Home Adaptive Equipment: Straight cane Prior Function Level of Independence: Independent Able to Take Stairs?: Reciprically Driving: Yes Vocation: Unemployed Comments: One fall within the past year. Pt states she fell off stool as she was hanging curtains and sustained no injury. Communication Communication: No difficulties Dominant Hand: Right    Cognition  Overall Cognitive Status: Appears within functional limits for tasks assessed/performed Arousal/Alertness: Awake/alert Orientation Level: Appears intact for tasks assessed Behavior During Session: [facility] for tasks performed    Extremity/Trunk Assessment Right Upper Extremity Assessment RUE ROM/Strength/Tone: Within functional levels RUE Sensation: Deficits RUE Sensation Deficits: Pt reports numbness/tingling described as "it feels like I don't have any circulation" on palmar surface of digits 2-5.  RUE Coordination: WFL - gross/fine motor Left Upper Extremity Assessment LUE  ROM/Strength/Tone: Within functional levels LUE Sensation: Deficits LUE Sensation Deficits: Pt reports numbness/tingling described as "it feels like I don't have any circulation" on palmar surface of digits 2-5.  LUE Coordination: WFL - gross/fine motor Right Lower Extremity Assessment RLE ROM/Strength/Tone: Within functional  levels Left Lower Extremity Assessment LLE ROM/Strength/Tone: Within functional levels   Mobility Transfers Transfers: Sit to Stand;Stand to Sit Sit to Stand: 7: Independent;From bed Stand to Sit: 7: Independent;To chair/3-in-1       Balance Balance Balance Assessed: Yes High Level Balance High Level Balance Comments: Pt able to Romberg eyes closed 20 sec, turn 360 degrees in 4 sec, pick object off floor and sit without use of hands. Pt unable to single limb stance s/p CVA  End of Session OT - End of Session Activity Tolerance: Patient tolerated treatment well Patient left: in chair;with call bell/phone within reach   Braxton Weisbecker 03/07/2012, 9:28 AM

## 2012-03-07 NOTE — Evaluation (Signed)
Physical Therapy Evaluation Patient Details Name: [name] MRN: [id number] DOB: Feb 12, 1951 Today's Date: 03/07/2012 Time: 1191-4782 PT Time Calculation (min): 19 min  PT Assessment / Plan / Recommendation Clinical Impression  Pt admitted with chest pain and currently demonstrating baseline mobility. Pt educated to increase daily ambulation and LE HEP to maintain strength and function. Pt agreeable to this recommendation and no further therapy.     PT Assessment  Patent does not need any further PT services    Follow Up Recommendations  No PT follow up    Equipment Recommendations  None recommended by PT    Frequency      Precautions / Restrictions Precautions Precautions: Fall Restrictions Weight Bearing Restrictions: No   Pertinent Vitals/Pain Aching left leg      Mobility  Transfers Transfers: Sit to Stand;Stand to Sit Sit to Stand: 7: Independent;From bed Stand to Sit: 7: Independent;To chair/3-in-1 Ambulation/Gait Ambulation/Gait Assistance: 6: Modified independent (Device/Increase time) Ambulation Distance (Feet): 350 Feet Assistive device: None Ambulation/Gait Assistance Details: 2 standing rests secondary to reported LLE pain. Slightly decreased speed but baseline for pt. Decreased dorsiflexion LLE but with formal testing equal bilaterally 4/5 Gait Pattern: Within Functional Limits Stairs: Yes Stairs Assistance: 6: Modified independent (Device/Increase time) Stair Management Technique: Two rails Number of Stairs: 4     Exercises     PT Goals    Visit Information  Last PT Received On: 03/07/12 Assistance Needed: +1 PT/OT Co-Evaluation/Treatment: Yes    Subjective Data  Subjective: I don't walk much Patient Stated Goal: return home   Prior Functioning  Home Living Lives With: Daughter Available Help at Discharge: Available PRN/intermittently Type of Home: House Home Access: Stairs to enter Secretary/administrator of Steps: 3 Entrance  Stairs-Rails: Can reach both;Left;Right Home Layout: One level Bathroom Shower/Tub: Engineer, manufacturing systems: Standard Home Adaptive Equipment: Straight cane Prior Function Level of Independence: Independent Able to Take Stairs?: Reciprically Driving: Yes Vocation: Unemployed Comments: One fall within the past year. Pt states she fell off stool as she was hanging curtains and sustained no injury. Communication Communication: No difficulties Dominant Hand: Right    Cognition  Overall Cognitive Status: Appears within functional limits for tasks assessed/performed Arousal/Alertness: Awake/alert Orientation Level: Appears intact for tasks assessed Behavior During Session: [facility] for tasks performed    Extremity/Trunk Assessment Right Lower Extremity Assessment RLE ROM/Strength/Tone: Within functional levels Left Lower Extremity Assessment LLE ROM/Strength/Tone: Within functional levels   Balance Balance Balance Assessed: Yes High Level Balance High Level Balance Comments: Pt able to Romberg eyes closed 20 sec, turn 360 degrees in 4 sec, pick object off floor and sit without use of hands. Pt unable to single limb stance s/p CVA  End of Session PT - End of Session Activity Tolerance: Patient tolerated treatment well Patient left: in chair;with call bell/phone within reach Nurse Communication: Mobility status   Delorse Lek 03/07/2012, 9:26 AM  Delaney Meigs, PT [phone]

## 2012-03-08 ENCOUNTER — Encounter (HOSPITAL_COMMUNITY)
Admission: EM | Disposition: A | Discharge status: Home / Self Care | Payer: Self-pay | Source: Home / Self Care | Attending: Internal Medicine

## 2012-03-08 HISTORY — PX: LEFT HEART CATHETERIZATION WITH CORONARY ANGIOGRAM: SHX5451

## 2012-03-08 LAB — GLUCOSE, CAPILLARY
Glucose-Capillary: 80 mg/dL (ref 70–99)
Glucose-Capillary: 70 mg/dL (ref 70–99)
Glucose-Capillary: 83 mg/dL (ref 70–99)

## 2012-03-08 LAB — BASIC METABOLIC PANEL
CO2: 25 mEq/L (ref 19–32)
Chloride: 104 mEq/L (ref 96–112)
Potassium: 3.6 mEq/L (ref 3.5–5.1)
Sodium: 139 mEq/L (ref 135–145)

## 2012-03-08 LAB — CBC
MCH: 29.9 pg (ref 26.0–34.0)
MCV: 89.8 fL (ref 78.0–100.0)
Platelets: 240 10*3/uL (ref 150–400)
RDW: 14.3 % (ref 11.5–15.5)
WBC: 4.5 10*3/uL (ref 4.0–10.5)

## 2012-03-08 SURGERY — LEFT HEART CATHETERIZATION WITH CORONARY ANGIOGRAM
Anesthesia: LOCAL

## 2012-03-08 MED ORDER — FENTANYL CITRATE 0.05 MG/ML IJ SOLN
INTRAMUSCULAR | Status: AC
  Filled 2012-03-08: qty 2

## 2012-03-08 MED ORDER — HEPARIN (PORCINE) IN NACL 2-0.9 UNIT/ML-% IJ SOLN
INTRAMUSCULAR | Status: AC
  Filled 2012-03-08: qty 2000

## 2012-03-08 MED ORDER — ACETAMINOPHEN 325 MG PO TABS
650.0000 mg | ORAL_TABLET | ORAL | Status: DC | PRN
  Administered 2012-03-08: 650 mg via ORAL
  Filled 2012-03-08: qty 2

## 2012-03-08 MED ORDER — LIDOCAINE HCL (PF) 1 % IJ SOLN
INTRAMUSCULAR | Status: AC
  Filled 2012-03-08: qty 30

## 2012-03-08 MED ORDER — SODIUM CHLORIDE 0.9 % IV SOLN
INTRAVENOUS | Status: AC
  Administered 2012-03-08: 13:00:00 via INTRAVENOUS

## 2012-03-08 MED ORDER — ONDANSETRON HCL 4 MG/2ML IJ SOLN: 4.0000 mg | Freq: Four times a day (QID) | INTRAMUSCULAR | Status: DC | PRN

## 2012-03-08 MED ORDER — MIDAZOLAM HCL 2 MG/2ML IJ SOLN
INTRAMUSCULAR | Status: AC
  Filled 2012-03-08: qty 2

## 2012-03-08 MED ORDER — NITROGLYCERIN 0.2 MG/ML ON CALL CATH LAB
INTRAVENOUS | Status: AC
  Filled 2012-03-08: qty 1

## 2012-03-08 NOTE — Progress Notes (Signed)
Subjective: Feeling well, no chest pain.  Objective: Filed Vitals:   03/08/12 0531 03/08/12 1053 03/08/12 1107 03/08/12 1256  BP: 130/68 142/61  114/78  Pulse: 61 68 61 74  Temp: 98.3 F (36.8 C) 98.5 F (36.9 C)    TempSrc: Oral Oral    Resp: 17 20  20   Height:      Weight:      SpO2: 92% 99%  98%   Weight change:   Intake/Output Summary (Last 24 hours) at 03/08/12 1544 Last data filed at 03/08/12 0900  Gross per 24 hour  Intake    600 ml  Output   1200 ml  Net   -600 ml    General: Alert, awake, oriented x3, in no acute distress.  HEENT: No bruits, no goiter.  Heart: Regular rate and rhythm, without murmurs, rubs, gallops.  Lungs: CTA, bilateral air movement.  Abdomen: Soft, nontender, nondistended, positive bowel sounds.  Neuro: Grossly intact, nonfocal. Extremities; no edema.   Lab Results:  Basename 03/08/12 0848 03/07/12 0700  NA 139 139  K 3.6 3.6  CL 104 102  CO2 25 26  GLUCOSE 103* 137*  BUN 20 19  CREATININE 1.37* 1.28*  CALCIUM 9.4 9.7  MG -- --  PHOS -- --    Basename 03/08/12 0450 03/06/12 0525  WBC 4.5 4.9  NEUTROABS -- --  HGB 9.1* 9.5*  HCT 27.3* 28.9*  MCV 89.8 90.0  PLT 240 250    Basename 03/06/12 1330 03/06/12 0525 03/05/12 2212  CKTOTAL 221* 211* 248*  CKMB 2.7 2.6 2.9  CKMBINDEX -- -- --  TROPONINI <0.30 <0.30 <0.30    Basename 03/05/12 2154  DDIMER 16.20*    Basename 03/05/12 2154  HGBA1C 10.5*    Basename 03/05/12 2213  CHOL 223*  HDL 44  LDLCALC 143*  TRIG 180*  CHOLHDL 5.1  LDLDIRECT --    Basename 03/06/12 1605 03/05/12 2154  TSH -- 39.436*  T4TOTAL -- --  T3FREE 1.6* --  THYROIDAB -- --    Micro Results: No results found for this or any previous visit (from the past 240 hour(s)).  Studies/Results: No results found.  Medications: I have reviewed the patient's current medications.  Chest pain (03/05/2012) Cardiac enzymes times 3 negative.  D dimer increase, V- scan result low probability for  PE.  ECHO: Left ventricle: The cavity size was normal. There was moderate concentric hypertrophy. Systolic function was vigorous. The estimated ejection fraction was in the range of 65% to 70%. Wall motion was normal; there were no regional wall motion abnormalities. Doppler parameters are consistent with abnormal left ventricular relaxation (grade 1 diastolic dysfunction).  Cath showed High-grade >80% stenosis in the first diagonal branch.  HYPOTHYROIDISM, UNSPECIFIED (01/10/2007) Increase TSH , low free T 3 : 1.6 and T 4 : 0.72.  She relates that she has been taking her medications. I will increase synthroid. She will need repeated function test in 4 weeks.   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 HBA 1c at 10. Wonder if she is really taking her medications.  Diabetes education.   HYPERCHOLESTEROLEMIA (01/10/2007) Continue with fenofibrate, statins.   HYPERTENSION, BENIGN SYSTEMIC (01/10/2007) Continue with Norvasc    Increase D dimer: check lower extremities doppler. V- Q scan pending.  Renal insufficiency: IV fluids. Renal function improving.   PVD:  ABI preliminary: Right ABI indicates mild to moderate reduction in arterial flow at rest. Left ABI indicates moderate to severe reduction in  arterial flow at rest. 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 Cardio following.          LOS: 3 days   Cerise Lieber M.D.  [facility] 03/08/2012, 3:44 PM

## 2012-03-08 NOTE — Progress Notes (Signed)
Cardiac Rehab 1310 We received order post PCI. Pt did not have PCI. Please reorder [place] if we need to see pt.Wisdom Seybold DunlapRN

## 2012-03-08 NOTE — Progress Notes (Signed)
If renal function is stable in AM, could discharge in AM and I will see in follow up as OP. Appointment already set and placed in discharge navigator.

## 2012-03-08 NOTE — CV Procedure (Signed)
     Diagnostic Cardiac Catheterization Report  [name]  61 y.o.  female [birth year]  Procedure Date: 03/08/2012  Referring Physician: [facility] hospital[facility], Dr. Sunnie Nielsen Primary Cardiologist:: Gwynneth Albright, M.D.   PROCEDURE:  Left heart catheterization with selective coronary angiography, left ventriculogram and abdominal aortography  INDICATIONS:  Angina and claudication, progressive over the past 4 weeks.  The risks, benefits, and details of the procedure were explained to the patient.  The patient verbalized understanding and wanted to proceed.  Informed written consent was obtained.  PROCEDURE TECHNIQUE:  After Xylocaine anesthesia a 5 French sheath was placed in the right femoral artery with a single anterior needle wall stick utilizing a "Smart needle".   Coronary angiography was done using a 5 [place] JL 3.5 cm left Judkins catheter and a 5 [place] A2 multipurpose catheter.  Left ventriculography was done using a 5 [place] A2 multipurpose catheter. Abdominal aortography was performed with a 5 French straight pigtail catheter.  CONTRAST:  Total of 135 cc.  COMPLICATIONS:  None.    HEMODYNAMICS:  Aortic pressure was 122/51 mmHg; LV pressure was 122/6 mm mark; LVEDP 12 mm mercury.  There was no gradient between the left ventricle and aorta.    ANGIOGRAPHIC DATA:   The left main coronary artery is widely patent with gentle less than 20% distal narrowing.  The left anterior descending artery is large and transapical in distribution. Minimal irregularities are noted in the midsegment. The first diagonal is a branching vessel. After it bifurcates the more medial limb contains a 90% stenosis. The distribution beyond the stenosis is small.  The left circumflex artery is dominant giving origin to 4 obtuse marginal branches with the third and fourth branches being the larger territory supplied. The fourth marginal contains proximal 50% stenosis. The PDA arises from beyond the  fourth obtuse marginal..  The right coronary artery is nondominant and patent.  LEFT VENTRICULOGRAM:  Left ventricular angiogram was done in the 30 RAO projection and revealed normal left ventricular wall motion and systolic function with an estimated ejection fraction of 70% %.    ABDOMINAL AORTOGRAPHY: A small fusiform abdominal aortic aneurysm is noted. The right internal iliac contains greater than 50% stenosis. In the left internal iliac is totally occluded.  IMPRESSIONS:  1. High-grade >80% stenosis in the first diagonal branch. I believe this accounts for the patient's angina. Otherwise widely patent coronary arteries.  2. Normal left ventricular systolic function  3. Ectatic abdominal aorta/small fusiform aneurysm  4. 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 Medical therapy for coronary disease by adding long-acting nitrate therapy(Imdur).  2. We'll have my colleague Dr. Eldridge Dace to look at the patient's abdominal aortogram and consider further evaluation if indicated.

## 2012-03-08 NOTE — Progress Notes (Signed)
Patient Name: [name] Date of Encounter: 03/08/2012    SUBJECTIVE: The patient had a good night. She was able to ambulate slowly in the hall. No leg discomfort. Mild dyspnea on exertion  TELEMETRY:  Normal sinus rhythm.: Filed Vitals:   03/07/12 0918 03/07/12 1414 03/07/12 2138 03/08/12 0531  BP:  137/69 114/59 130/68  Pulse: 67 69 71 61  Temp:  98.4 F (36.9 C) 98.1 F (36.7 C) 98.3 F (36.8 C)  TempSrc:  Oral Oral Oral  Resp:  20 18 17   Height:      Weight:      SpO2: 97% 96% 94% 92%    Intake/Output Summary (Last 24 hours) at 03/08/12 0755 Last data filed at 03/08/12 4098  Gross per 24 hour  Intake    840 ml  Output   1000 ml  Net   -160 ml    LABS: Basic Metabolic Panel:  Basename 03/07/12 0700 03/06/12 0525  NA 139 135  K 3.6 3.8  CL 102 99  CO2 26 24  GLUCOSE 137* 238*  BUN 19 29*  CREATININE 1.28* 1.50*  CALCIUM 9.7 9.5  MG -- --  PHOS -- --   CBC:  Basename 03/08/12 0450 03/06/12 0525  WBC 4.5 4.9  NEUTROABS -- --  HGB 9.1* 9.5*  HCT 27.3* 28.9*  MCV 89.8 90.0  PLT 240 250   Cardiac Enzymes:  Basename 03/06/12 1330 03/06/12 0525 03/05/12 2212  CKTOTAL 221* 211* 248*  CKMB 2.7 2.6 2.9  CKMBINDEX -- -- --  TROPONINI <0.30 <0.30 <0.30   BNP: No components found with this basename: POCBNP:3 Hemoglobin A1C:  Basename 03/05/12 2154  HGBA1C 10.5*   Fasting Lipid Panel:  Basename 03/05/12 2213  CHOL 223*  HDL 44  LDLCALC 143*  TRIG 180*  CHOLHDL 5.1  LDLDIRECT --   ECHOCARDUIOGRAPHY: Study Conclusions  Left ventricle: The cavity size was normal. There was moderate concentric hypertrophy. Systolic function was vigorous. The estimated ejection fraction was in the range of 65% to 70%. Wall motion was normal; there were no regional wall motion abnormalities. Doppler parameters are consistent with abnormal left ventricular relaxation (grade 1 diastolic dysfunction).   ABI: Left lower extremity <0.5  Radiology/Studies:  No  new  Physical Exam: Blood pressure 130/68, pulse 61, temperature 98.3 F (36.8 C), temperature source Oral, resp. rate 17, height 5\' 2"  (1.575 m), weight 78.472 kg (173 lb), SpO2 92.00%. Weight change:    Bilateral femoral bruits 1-2+ femoral pulses bilateral. Absent pedal pulses below the knee bilaterally.  No change in cardiac exam.  ASSESSMENT:  1. Exertional chest pain highly likely to be related to angina pectoris in this diabetic with previous strokes and documented peripheral vascular disease.  2. Peripheral arterial disease with left lower extremity claudication and ankle brachial index less than 0.5 all left.  3. Prior strokes  4. Diabetes mellitus, uncontrolled.   Plan:  1. Patient needs establishment with a primary care physician and aggressive diabetes control.  2. Coronary angiography later this morning  3. We'll probably do an abdominal aortography and iliofemoral images at the time of coronary angiography depending upon how things go.  Selinda Eon 03/08/2012, 7:55 AM

## 2012-03-08 NOTE — Progress Notes (Signed)
Utilization Review Completed.Jordyne Poehlman T4/26/2013   

## 2012-03-09 LAB — GLUCOSE, CAPILLARY
Glucose-Capillary: 153 mg/dL — ABNORMAL HIGH (ref 70–99)
Glucose-Capillary: 189 mg/dL — ABNORMAL HIGH (ref 70–99)

## 2012-03-09 MED ORDER — CLOPIDOGREL BISULFATE 75 MG PO TABS: 75.0000 mg | ORAL_TABLET | Freq: Every day | ORAL | Status: DC

## 2012-03-09 MED ORDER — ISOSORBIDE MONONITRATE ER 60 MG PO TB24: 60.0000 mg | ORAL_TABLET | Freq: Every day | ORAL | Status: DC

## 2012-03-09 MED ORDER — DSS 100 MG PO CAPS: 100.0000 mg | ORAL_CAPSULE | Freq: Two times a day (BID) | ORAL | Status: AC

## 2012-03-09 MED ORDER — LEVOTHYROXINE SODIUM 125 MCG PO TABS: 125.0000 ug | ORAL_TABLET | Freq: Every day | ORAL | Status: DC

## 2012-03-09 NOTE — Progress Notes (Signed)
Subjective:  Doing well this am, no CP, no SOB.  Creat stable.   Objective:  Vital Signs in the last 24 hours: Temp:  [98.5 F (36.9 C)-98.8 F (37.1 C)] 98.5 F (36.9 C) (04/27 0438) Pulse Rate:  [61-77] 77  (04/27 0438) Resp:  [18-20] 19  (04/27 0438) BP: (114-142)/(60-78) 141/60 mmHg (04/27 0438) SpO2:  [94 %-99 %] 95 % (04/27 0438)  Intake/Output from previous day: 04/26 0701 - 04/27 0700 In: 240 [P.O.:240] Out: 1600 [Urine:1600]   Physical Exam: General: Well developed, well nourished, in no acute distress. Head:  Normocephalic and atraumatic. Lungs: Clear to auscultation and percussion. Heart: Normal S1 and S2.  No murmur, rubs or gallops.  Pulses: Pulses normal in all 4 extremities. Abdomen: soft, non-tender, positive bowel sounds. Extremities: No clubbing or cyanosis. No edema. Neurologic: Alert and oriented x 3.    Lab Results:  [facility] 03/08/12 0450  WBC 4.5  HGB 9.1*  PLT 240    Basename 03/08/12 0848 03/07/12 0700  NA 139 139  K 3.6 3.6  CL 104 102  CO2 25 26  GLUCOSE 103* 137*  BUN 20 19  CREATININE 1.37* 1.28*    Basename 03/06/12 1330  TROPONINI <0.30   Hepatic Function Panel No results found for this basename: PROT,ALBUMIN,AST,ALT,ALKPHOS,BILITOT,BILIDIR,IBILI in the last 72 hours No results found for this basename: CHOL in the last 72 hours No results found for this basename: PROTIME in the last 72 hours  Imaging: No results found. Personally viewed.   Telemetry: No afib, no VT Personally viewed.   EKG:  SR, first degree AVB, NSSTW changes  Cardiac Studies:  Cath: IMPRESSIONS: 1. High-grade >80% stenosis in the first diagonal branch. I believe this accounts for the patient's angina. Otherwise widely patent coronary arteries.  2. Normal left ventricular systolic function  3. Ectatic abdominal aorta/small fusiform aneurysm  4. 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  Medical therapy for coronary disease by adding long-acting nitrate therapy(Imdur).  2. We'll have my colleague Dr. Eldridge Dace to look at the patient's abdominal aortogram and consider further evaluation if indicated.   Assessment/Plan:  Principal Problem:  *Chest pain Active Problems:  HYPOTHYROIDISM, UNSPECIFIED  DIABETES MELLITUS, II, COMPLICATIONS  HYPERCHOLESTEROLEMIA  TOBACCO DEPENDENCE  HYPERTENSION, BENIGN SYSTEMIC  Dyspnea on exertion  -ok to dc home.   - has f/u in office  - Monitor HTN, CKD 2  - DM  - Will follow PVD - see above. Exercise, walking. Plavix reasonable.   - Imdur for angina. New med.  Crestor for HL.   - TOB cessation.    Mel Langan 03/09/2012, 10:01 AM

## 2012-03-09 NOTE — Discharge Summary (Signed)
Admit date: 03/05/2012 Discharge date: 03/09/2012  Primary Care Physician:  Iona Hansen, NP, NP   Discharge Diagnoses:        CHEST PAIN SECONDARY TO ANGINA, CAD.       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 HYPOTHYROIDISM, UNSPECIFIED 01/10/2007   . TOBACCO DEPENDENCE 01/10/2007   . HYPERTENSION, BENIGN SYSTEMIC 01/10/2007   . HYPERCHOLESTEROLEMIA 01/10/2007   . DIABETES MELLITUS, II, COMPLICATIONS 01/10/2007              DISCHARGE MEDICATION: Medication List  As of 03/09/2012  1:06 PM   STOP taking these medications         gemfibrozil 600 MG tablet      lisinopril-hydrochlorothiazide 10-12.5 MG per tablet         TAKE these medications         amLODipine 5 MG tablet   Commonly known as: NORVASC   Take 5 mg by mouth every morning.      aspirin EC 81 MG tablet   Take 81 mg by mouth every morning.      Calcium 600+D 600-400 MG-UNIT per tablet   Generic drug: Calcium Carbonate-Vitamin D   Take 1 tablet by mouth every morning.      clopidogrel 75 MG tablet   Commonly known as: PLAVIX   Take 75 mg by mouth every morning.      DSS 100 MG Caps   Take 100 mg by mouth 2 (two) times daily.      fenofibrate 54 MG tablet   Take 1 tablet (54 mg total) by mouth daily.      ferrous sulfate 325 (65 FE) MG tablet   Take 325 mg by mouth every morning.      glimepiride 2 MG tablet   Commonly known as: AMARYL   Take 2 mg by mouth 2 (two) times daily.      insulin glargine 100 UNIT/ML injection   Commonly known as: LANTUS   Inject 25 Units into the skin at bedtime.      isosorbide mononitrate 60 MG 24 hr tablet   Commonly known as: IMDUR   Take 1 tablet (60 mg total) by mouth daily.      levothyroxine 125 MCG tablet   Commonly known as: SYNTHROID, LEVOTHROID   Take 1 tablet (125 mcg total) by mouth daily before breakfast.      mulitivitamin with minerals Tabs   Take 1 tablet by mouth every morning.     pantoprazole 40 MG tablet   Commonly known as: PROTONIX   Take 1 tablet (40 mg total) by mouth daily.      pravastatin 20 MG tablet   Commonly known as: PRAVACHOL   Take 20 mg by mouth every morning.      rOPINIRole 0.25 MG tablet   Commonly known as: REQUIP   Take 2 tablets (0.5 mg total) by mouth at bedtime.              Consults: Treatment Team:  Lesleigh Noe, MD   SIGNIFICANT DIAGNOSTIC STUDIES:  Dg Chest 2 View  03/05/2012  *RADIOLOGY REPORT*  Clinical Data: Shortness of breath  CHEST - 2 VIEW  Comparison: 07/22/2004  Findings: Cardiac leads overlie the chest.  Heart size is normal. Lung volumes are low but clear.  No pleural effusion.  No acute osseous finding.  IMPRESSION: No acute cardiopulmonary process.  Original Report Authenticated By: Harrel Lemon, M.D.  [facility] Perfusion  03/06/2012  *RADIOLOGY REPORT*  Clinical Data:  Elevated D-dimer, concern pulmonary embolism.   NUCLEAR MEDICINE VENTILATION - PERFUSION LUNG SCAN  Technique:    Perfusion images were obtained in multiple projections after intravenous injection of Tc-74m MAA.  Radiopharmaceuticals:  3.0 mCi Tc-14m MAA.  Comparison: Chest radiograph 03/05/2012  Findings: .  Perfusion:  No wedge shaped peripheral perfusion defects to suggest acute pulmonary embolism  IMPRESSION:  Very low probability for acute pulmonary embolism.  Original Report Authenticated By: Genevive Bi, M.D.     ECHO Left ventricle: The cavity size was normal. There was moderate concentric hypertrophy. Systolic function was vigorous. The estimated ejection fraction was in the range of 65% to 70%. Wall motion was normal; there were no regional wall motion abnormalities. Doppler parameters are consistent with abnormal left ventricular relaxation (grade 1 diastolic dysfunction).     CARDIAC CATH & OTHER PROCEDURES: IMPRESSIONS: 1. High-grade >80% stenosis in the first diagonal branch. I believe this accounts for the  patient's angina. Otherwise widely patent coronary arteries.  2. Normal left ventricular systolic function  3. Ectatic abdominal aorta/small fusiform aneurysm  4. 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 Pain is described as 8/10 at this height now down to 3/10 after nitroglycerin. Pain has got worse today before coming to the emergency room. It radiates down her left arm, described as sharp, persistent when it comes on. No nausea vomiting or diaphoresis. She feels a little bit of chills. She's also been having exertional dyspnea with pain in her lower extremities especially her left leg which has lead to decrease in her mobility. Her chest pain was notably reproducible with pressure to the chest. She had no prior cardiac history, she however has multiple risk factors for coronary artery disease.   Hospital Course:  Chest pain (03/05/2012) Secondary to angina, CAD. Cardiac enzymes times 3 negative. D dimer increase, V- scan result low probability for PE.  ECHO: Left ventricle: The cavity size was normal. There was moderate concentric hypertrophy. Systolic function was vigorous. The estimated ejection fraction was in the range of 65% to 70%. Wall motion was normal; there were no regional wall motion abnormalities. Doppler parameters are consistent with abnormal left ventricular relaxation (grade 1 diastolic dysfunction). Cardiology was consulted. Dr Katrinka Blazing perform Cardiac Cath that showed High-grade >80% stenosis in the first diagonal branch. Medical management. Patient was started on Imdur. No chest pain or dyspnea on exertion day of discharge.   HYPOTHYROIDISM, UNSPECIFIED (01/10/2007) Increase TSH at 39 , low free T 3 : 1.6 and T 4 : 0.72.  She relates that she has been  taking her medications. I will increase synthroid. She will need repeated function test in 4 weeks.   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 HBA 1c at 10. Wonder if she is really taking her medications.  Diabetes education. Needs to follow up with PCP and have better Blood sugar control.   HYPERCHOLESTEROLEMIA (01/10/2007) Continue with fenofibrate, statins. Gemfibrozil contraindicated to use with Simvastatin.   HYPERTENSION, BENIGN SYSTEMIC (01/10/2007) Continue with Norvasc, hold lisinopril due to mild renal insufficiency.   Increase D dimer: lower extremities doppler negative for DVT. V- Q scan low probability.   Renal insufficiency: IV fluids. Renal function improving.  PVD: ABI preliminary: Right ABI indicates mild to moderate reduction in arterial flow at rest. Left ABI indicates moderate to severe reduction in arterial flow at rest. 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 Continue with Plavix, follow up with Cardio out patient.       Disposition and Follow-up:  Discharge Orders    Future Orders Please Complete By Expires   Diet - low sodium heart healthy      Diet - low sodium heart healthy      Increase activity slowly      Increase activity slowly        Follow-up Information    Follow up with Lesleigh Noe, MD on 03/18/2012. (9:15AM)    Contact information:   [address] [place] [address] [place] [address] [phone]           DISCHARGE EXAM:  General: Alert, awake, oriented x3, in no acute distress.  HEENT: No bruits, no goiter.  Heart: Regular rate and rhythm, without murmurs, rubs, gallops.  Lungs: CTA, bilateral air movement.  Abdomen: Soft, nontender, nondistended, positive bowel sounds.  Neuro: Grossly intact, nonfocal.  Extremities; no edema.    Blood pressure 132/60, pulse 77, temperature 98.5 F (36.9 C), temperature source  Oral, resp. rate 19, height 5\' 2"  (1.575 m), weight 78.472 kg (173 lb), SpO2 95.00%.   Basename 03/08/12 0848 03/07/12 0700  NA 139 139  K 3.6 3.6  CL 104 102  CO2 25 26  GLUCOSE 103* 137*  BUN 20 19  CREATININE 1.37* 1.28*  CALCIUM 9.4 9.7  MG -- --  PHOS -- --    Basename 03/08/12 0450  WBC 4.5  NEUTROABS --  HGB 9.1*  HCT 27.3*  MCV 89.8  PLT 240    Signed: Shoji Pertuit M.D. 03/09/2012, 1:06 PM

## 2012-03-22 ENCOUNTER — Other Ambulatory Visit: Payer: Self-pay | Admitting: Internal Medicine

## 2012-04-13 ENCOUNTER — Other Ambulatory Visit: Payer: Self-pay | Admitting: Internal Medicine

## 2012-05-04 ENCOUNTER — Other Ambulatory Visit: Payer: Self-pay | Admitting: Internal Medicine

## 2012-06-03 ENCOUNTER — Encounter: Payer: Self-pay | Admitting: Internal Medicine

## 2012-06-11 ENCOUNTER — Other Ambulatory Visit: Payer: Self-pay | Admitting: Nurse Practitioner

## 2012-06-11 DIAGNOSIS — Z1231 Encounter for screening mammogram for malignant neoplasm of breast: Secondary | ICD-10-CM

## 2012-06-12 ENCOUNTER — Ambulatory Visit
Admission: RE | Admit: 2012-06-12 | Discharge: 2012-06-12 | Disposition: A | Discharge status: Home / Self Care | Payer: Medicaid Other | Source: Ambulatory Visit | Attending: Nurse Practitioner | Admitting: Nurse Practitioner

## 2012-06-12 DIAGNOSIS — Z1231 Encounter for screening mammogram for malignant neoplasm of breast: Secondary | ICD-10-CM

## 2012-06-18 ENCOUNTER — Ambulatory Visit: Payer: Medicare Other | Attending: Family Medicine | Admitting: Rehabilitation

## 2012-06-18 DIAGNOSIS — IMO0001 Reserved for inherently not codable concepts without codable children: Secondary | ICD-10-CM | POA: Insufficient documentation

## 2012-06-18 DIAGNOSIS — M25569 Pain in unspecified knee: Secondary | ICD-10-CM | POA: Insufficient documentation

## 2012-07-02 ENCOUNTER — Encounter: Payer: Self-pay | Admitting: Internal Medicine

## 2012-07-02 ENCOUNTER — Ambulatory Visit (INDEPENDENT_AMBULATORY_CARE_PROVIDER_SITE_OTHER): Payer: Medicare Other | Admitting: Internal Medicine

## 2012-07-02 VITALS — BP 140/78 | HR 68 | Ht 64.0 in | Wt 173.8 lb

## 2012-07-02 DIAGNOSIS — E119 Type 2 diabetes mellitus without complications: Secondary | ICD-10-CM

## 2012-07-02 DIAGNOSIS — Z1211 Encounter for screening for malignant neoplasm of colon: Secondary | ICD-10-CM

## 2012-07-02 DIAGNOSIS — K219 Gastro-esophageal reflux disease without esophagitis: Secondary | ICD-10-CM

## 2012-07-02 DIAGNOSIS — K5900 Constipation, unspecified: Secondary | ICD-10-CM

## 2012-07-02 DIAGNOSIS — I2510 Atherosclerotic heart disease of native coronary artery without angina pectoris: Secondary | ICD-10-CM

## 2012-07-02 NOTE — Progress Notes (Signed)
HISTORY OF PRESENT ILLNESS:  [name] is a 61 y.o. female with MULTIPLE SIGNIFICANT medical problems who is sent today by [name] Lan, FNP ([name]) regarding constipation, anemia, and screening colonoscopy. The patient has been cared for by the office for about 9 months. I have reviewed records from the office. She has a mild normocytic anemia with a hemoglobin of 11.2. No evidence for iron deficiency on testing, though she was placed on iron therapy.. Patient has had intermittent problems with constipation. This has been better recently. She does not take particular agents to help her bowels when constipated. She does have a history of GERD for which she takes pantoprazole 40 mg daily. On medication no reflux symptoms. No dysphagia. She tells me that she underwent prior colonoscopy in this office in 2004. We could not find any records that she has ever been seen here. However, with additional investigation, we were able to see that she was taken care of by another gastroenterologist in [place], [name]. I have obtained outside endoscopy reports dated 06/26/2003. The patient underwent both colonoscopy and upper endoscopy. The procedures were being done for Hemoccult-positive stool. Colonoscopy was said to be complete to the cecum examination was said to be entirely normal except for prominent ileocecal valve which was biopsied and returned showing benign small bowel mucosa. Complete upper endoscopy was normal except for mild gastric erythema. CLOtest it was said to be performed. The patient's GI review of systems are entirely negative. She does have significant medical problems including a history of stroke, diabetes mellitus which is poorly controlled recta sees recent hemoglobin A1c greater than 10), hypertension, symptomatic coronary artery disease for which she was hospitalized in April (reviewed) underwent cardiac catheterization. 80% lesion of a diagonal artery for which medical  therapy was recommended. She was seen by Dr. Verdis Prime. The patient was told to followup in the office but has failed to do so. She sites unpaid bills as the reason.  REVIEW OF SYSTEMS:  All non-GI ROS negative except for skin rash, itching, urinary leakage  Past Medical History  Diagnosis Date  . CVA (cerebral vascular accident) 01/2011    affected rt side  . Diabetes mellitus   . Hypertension   . CAD (coronary artery disease)   . Arteriosclerotic cardiovascular disease   . Hypothyroidism   . Hypocholesterolemia   . Renal insufficiency   . Anemia   . Vitamin d deficiency     Past Surgical History  Procedure Date  . Tubal ligation   . Cesarean section     Social History [name]  reports that she quit smoking about 16 months ago. She has never used smokeless tobacco. She reports that she does not drink alcohol or use illicit drugs.  family history includes Diabetes in her brother, father, mother, and sister.  No Known Allergies     PHYSICAL EXAMINATION: Vital signs: BP 140/78  Pulse 68  Ht 5\' 4"  (1.626 m)  Wt 173 lb 12.8 oz (78.835 kg)  BMI 29.83 kg/m2  Constitutional: generally well-appearing, no acute distress Psychiatric: alert and oriented x3, cooperative Eyes: extraocular movements intact, anicteric, conjunctiva pink Mouth: oral pharynx moist, no lesions Neck: supple no lymphadenopathy Cardiovascular: heart regular rate and rhythm, no murmur Lungs: clear to auscultation bilaterally Abdomen: soft,obese, nontender, nondistended, no obvious ascites, no peritoneal signs, normal bowel sounds, no organomegaly Rectal:deferred Extremities: no lower extremity edema bilaterally Skin: no lesions on visible extremities Neuro: No focal deficits.   ASSESSMENT:  #  1. Normocytic anemia. No evidence for active deficiency. Suspect secondary to chronic disease. Do not see the need for chronic iron therapy. We'll leave this to PCP #2. Intermittent functional  constipation #3. Index colonoscopy August 2004 with [name] gastroenterology. Normal. Due for routine followup in 10 years (around August 2014) unless otherwise clinically indicated #4. GERD. Symptoms controlled with PPI. Upper endoscopy with [name] August 2004 essentially normal #5. Multiple medical problems including coronary artery disease, peripheral vascular disease, cerebrovascular disease, and poorly controlled diabetes mellitus. On multiple diabetic medications including insulin. As well on aspirin and Plavix. Has not had proper cardiology followup since her hospitalization and catheterization in April   PLAN:  #1. May use MiraLax as needed for constipation #2. Reflux precautions #3. Continue PPI for GERD #4. Due for routine screening colonoscopy around August 2014. Prior to setting of colonoscopy, she needs to be seen by her cardiologist to address the feasibility of coming off Plavix for the procedure. As well, we discussed importance of good diabetic control. She will continue to work with her PCP in this regard. Patient has been instructed to schedule followup appointment next year around the anniversary date of her colonoscopy to have repeat routine screening. In the interim, she will return to the care of her PCP

## 2012-07-02 NOTE — Patient Instructions (Addendum)
Please call our office after you have consulted with your cardiologist.  The anniversary date of your next colonoscopy is August 2015

## 2012-09-19 ENCOUNTER — Emergency Department (HOSPITAL_COMMUNITY)
Admission: EM | Admit: 2012-09-19 | Discharge: 2012-09-19 | Disposition: A | Discharge status: Home / Self Care | Payer: Medicare Other | Attending: Emergency Medicine | Admitting: Emergency Medicine

## 2012-09-19 ENCOUNTER — Encounter (HOSPITAL_COMMUNITY): Payer: Self-pay | Admitting: *Deleted

## 2012-09-19 DIAGNOSIS — E539 Vitamin B deficiency, unspecified: Secondary | ICD-10-CM | POA: Insufficient documentation

## 2012-09-19 DIAGNOSIS — E119 Type 2 diabetes mellitus without complications: Secondary | ICD-10-CM | POA: Insufficient documentation

## 2012-09-19 DIAGNOSIS — E7800 Pure hypercholesterolemia, unspecified: Secondary | ICD-10-CM | POA: Insufficient documentation

## 2012-09-19 DIAGNOSIS — Z7982 Long term (current) use of aspirin: Secondary | ICD-10-CM | POA: Insufficient documentation

## 2012-09-19 DIAGNOSIS — I2510 Atherosclerotic heart disease of native coronary artery without angina pectoris: Secondary | ICD-10-CM | POA: Insufficient documentation

## 2012-09-19 DIAGNOSIS — M62838 Other muscle spasm: Secondary | ICD-10-CM

## 2012-09-19 DIAGNOSIS — N390 Urinary tract infection, site not specified: Secondary | ICD-10-CM | POA: Insufficient documentation

## 2012-09-19 DIAGNOSIS — E039 Hypothyroidism, unspecified: Secondary | ICD-10-CM | POA: Insufficient documentation

## 2012-09-19 DIAGNOSIS — E559 Vitamin D deficiency, unspecified: Secondary | ICD-10-CM | POA: Insufficient documentation

## 2012-09-19 DIAGNOSIS — Z87891 Personal history of nicotine dependence: Secondary | ICD-10-CM | POA: Insufficient documentation

## 2012-09-19 DIAGNOSIS — I10 Essential (primary) hypertension: Secondary | ICD-10-CM | POA: Insufficient documentation

## 2012-09-19 DIAGNOSIS — I6789 Other cerebrovascular disease: Secondary | ICD-10-CM | POA: Insufficient documentation

## 2012-09-19 DIAGNOSIS — Z794 Long term (current) use of insulin: Secondary | ICD-10-CM | POA: Insufficient documentation

## 2012-09-19 DIAGNOSIS — N289 Disorder of kidney and ureter, unspecified: Secondary | ICD-10-CM | POA: Insufficient documentation

## 2012-09-19 DIAGNOSIS — Z862 Personal history of diseases of the blood and blood-forming organs and certain disorders involving the immune mechanism: Secondary | ICD-10-CM | POA: Insufficient documentation

## 2012-09-19 LAB — URINALYSIS, ROUTINE W REFLEX MICROSCOPIC
Glucose, UA: 500 mg/dL — AB
Nitrite: NEGATIVE
Protein, ur: 100 mg/dL — AB
pH: 6.5 (ref 5.0–8.0)

## 2012-09-19 LAB — POCT I-STAT, CHEM 8
BUN: 24 mg/dL — ABNORMAL HIGH (ref 6–23)
Calcium, Ion: 1.16 mmol/L (ref 1.13–1.30)
Chloride: 104 mEq/L (ref 96–112)
Creatinine, Ser: 1.3 mg/dL — ABNORMAL HIGH (ref 0.50–1.10)
Glucose, Bld: 244 mg/dL — ABNORMAL HIGH (ref 70–99)
HCT: 34 % — ABNORMAL LOW (ref 36.0–46.0)
Hemoglobin: 11.6 g/dL — ABNORMAL LOW (ref 12.0–15.0)
Potassium: 4.4 mEq/L (ref 3.5–5.1)
Sodium: 139 mEq/L (ref 135–145)
TCO2: 28 mmol/L (ref 0–100)

## 2012-09-19 LAB — GLUCOSE, CAPILLARY: Glucose-Capillary: 204 mg/dL — ABNORMAL HIGH (ref 70–99)

## 2012-09-19 LAB — URINE MICROSCOPIC-ADD ON

## 2012-09-19 MED ORDER — SULFAMETHOXAZOLE-TRIMETHOPRIM 800-160 MG PO TABS: 1.0000 | ORAL_TABLET | Freq: Two times a day (BID) | ORAL | Status: DC

## 2012-09-19 NOTE — ED Provider Notes (Signed)
History     CSN: [id number]  Arrival date & time 09/19/12  1500   First MD Initiated Contact with Patient 09/19/12 1734      No chief complaint on file.   (Consider location/radiation/quality/duration/timing/severity/associated sxs/prior treatment) HPI Comments: Patient is a 61-year-old female with a history of diabetes, hypertension, CAD and a renal insufficiency that presents emergency department with a chief complaint of muscle spasms in both upper and lower extremities.  Onset of symptoms began months ago and have been followed by her primary care physician, however she is dissatisfied stating that he has not done anything for her spasms.  Symptoms are worse at night and are described as a cramping twitching sensation in her muscles.  Specific locations include upper thighs and bicep region.  Patient denies any generalized weakness, change in vision, chest pain, shortness of breath, difficulty breathing, numbness or tingling of extremities, trauma, headache, ataxia, disequilibrium, fevers, night sweats, chills, or slurred speech.  Patient has no other complaints at this time.  The history is provided by the patient.    Past Medical History  Diagnosis Date  . CVA (cerebral vascular accident) 01/2011    affected rt side  . Diabetes mellitus   . Hypertension   . CAD (coronary artery disease)   . Arteriosclerotic cardiovascular disease   . Hypothyroidism   . Hypocholesterolemia   . Renal insufficiency   . Anemia   . Vitamin D deficiency     Past Surgical History  Procedure Date  . Tubal ligation   . Cesarean section     Family History  Problem Relation Age of Onset  . Diabetes Mother   . Diabetes Father   . Diabetes Sister   . Diabetes Brother     History  Substance Use Topics  . Smoking status: Former Smoker    Quit date: 02/04/2011  . Smokeless tobacco: Never Used  . Alcohol Use: No    OB History    Grav Para Term Preterm Abortions TAB SAB Ect Mult Living               Review of Systems  Constitutional: Negative for fever, chills and appetite change.  HENT: Negative for congestion.   Eyes: Negative for visual disturbance.  Respiratory: Negative for shortness of breath.   Cardiovascular: Negative for chest pain and leg swelling.  Gastrointestinal: Negative for abdominal pain.  Genitourinary: Negative for dysuria, urgency and frequency.  Musculoskeletal: Negative for back pain and joint swelling.       Muscle spasms/twitching   Neurological: Negative for dizziness, syncope, weakness, light-headedness, numbness and headaches.  Psychiatric/Behavioral: Negative for confusion.    Allergies  Review of patient's allergies indicates no known allergies.  Home Medications   Current Outpatient Rx  Name  Route  Sig  Dispense  Refill  . AMLODIPINE BESYLATE 5 MG PO TABS   Oral   Take 5 mg by mouth every morning.          . ASPIRIN EC 81 MG PO TBEC   Oral   Take 81 mg by mouth every morning.          Marland Kitchen CALCIUM CARBONATE-VITAMIN D 600-400 MG-UNIT PO TABS   Oral   Take 1 tablet by mouth every morning.         Marland Kitchen CLOPIDOGREL BISULFATE 75 MG PO TABS   Oral   Take 75 mg by mouth every morning.          Marland Kitchen FLAX SEED OIL 1000 MG  PO CAPS   Oral   Take 1 capsule by mouth daily.         Marland Kitchen GLIMEPIRIDE 2 MG PO TABS   Oral   Take 2 mg by mouth 2 (two) times daily.         . INSULIN GLARGINE 100 UNIT/ML Brandon SOLN   Subcutaneous   Inject 30 Units into the skin at bedtime.          . ISOSORBIDE MONONITRATE ER 60 MG PO TB24   Oral   Take 60 mg by mouth daily.         Marland Kitchen VITAMIN C 500 MG PO TABS   Oral   Take 500 mg by mouth daily.           BP 153/70  Pulse 65  Temp 98.3 F (36.8 C) (Oral)  Resp 18  SpO2 97%  Physical Exam  Constitutional: She is oriented to person, place, and time. She appears well-developed and well-nourished. No distress.  HENT:  Head: Normocephalic and atraumatic.  Mouth/Throat: Oropharynx is  clear and moist. No oropharyngeal exudate.  Eyes: Conjunctivae normal and EOM are normal. Pupils are equal, round, and reactive to light. No scleral icterus.  Neck: Normal range of motion. Neck supple. No tracheal deviation present. No thyromegaly present.  Cardiovascular: Normal rate, regular rhythm, normal heart sounds and intact distal pulses.   Pulmonary/Chest: Effort normal and breath sounds normal. No stridor. No respiratory distress. She has no wheezes.  Abdominal: Soft.  Musculoskeletal: Normal range of motion. She exhibits no edema and no tenderness.  Neurological: She is alert and oriented to person, place, and time. Coordination normal.       CNIII-XII intact, strength 5/5 right & 4/5 left (consistant w old CVA), sensory adn coordination intact, normal gait  Skin: Skin is warm and dry. No rash noted. She is not diaphoretic. No erythema. No pallor.  Psychiatric: She has a normal mood and affect. Her behavior is normal.    ED Course  Procedures (including critical care time)  Labs Reviewed  URINALYSIS, ROUTINE W REFLEX MICROSCOPIC - Abnormal; Notable for the following:    APPearance CLOUDY (*)     Glucose, UA 500 (*)     Hgb urine dipstick SMALL (*)     Protein, ur 100 (*)     Leukocytes, UA SMALL (*)     All other components within normal limits  POCT I-STAT, CHEM 8 - Abnormal; Notable for the following:    BUN 24 (*)     Creatinine, Ser 1.30 (*)     Glucose, Bld 244 (*)     Hemoglobin 11.6 (*)     HCT 34.0 (*)     All other components within normal limits  URINE MICROSCOPIC-ADD ON - Abnormal; Notable for the following:    Squamous Epithelial / LPF FEW (*)     Bacteria, UA MANY (*)     All other components within normal limits  URINE CULTURE   No results found.   No diagnosis found.    MDM  61 year old female with a history of diabetes, CAD, renal insufficiency and hypertension presents to the emergency department complaining of upper and lower extremity muscle  spasms.  Electrolytes and urine checked with no new acute abnormalities.  Physical exam at pts baseline.  No new focal neuro deficits seen. UA consistent w UTI, culture sent. Pt is afebrile, no CVA tenderness, normotensive, and denies N/V. Pt to be dc home with antibiotics. At this  time there does not appear to be any evidence of an acute emergency medical condition and the patient appears stable for discharge with appropriate outpatient follow up.Diagnosis was discussed with patient who verbalizes understanding and is agreeable to discharge.        Jaci Carrel, [place] 09/19/12 1947

## 2012-09-19 NOTE — ED Notes (Signed)
Pt states she's having muscle spasms in her arms and legs x months, states she has talked to her doctor about it but her doctor didn't do anything, pt states sometimes she has a spasm in her arm and sometimes she has one in her leg, pt states it's worse at night when laying down. Pt states she was on an acid reducer for her stomach and when she stopped taking it that's when she started having the muscle spasms.

## 2012-09-22 LAB — URINE CULTURE: Colony Count: >=100000

## 2012-09-23 NOTE — ED Notes (Signed)
+  urine Patient treated with Bactrim-sensitive to same-chart appended per protocol MD. 

## 2012-09-25 NOTE — ED Provider Notes (Signed)
Medical screening examination/treatment/procedure(s) were performed by non-physician practitioner and as supervising physician I was immediately available for consultation/collaboration.  Teagen Bucio, MD 09/25/12 0056 

## 2012-12-17 ENCOUNTER — Ambulatory Visit (HOSPITAL_COMMUNITY)
Admission: RE | Admit: 2012-12-17 | Discharge: 2012-12-18 | Disposition: A | Discharge status: Home / Self Care | Payer: Medicare Other | Source: Ambulatory Visit | Attending: Cardiology | Admitting: Cardiology

## 2012-12-17 ENCOUNTER — Encounter (HOSPITAL_COMMUNITY)
Admission: RE | Disposition: A | Discharge status: Home / Self Care | Payer: Self-pay | Source: Ambulatory Visit | Attending: Cardiology

## 2012-12-17 ENCOUNTER — Encounter (HOSPITAL_COMMUNITY): Payer: Self-pay | Admitting: General Practice

## 2012-12-17 DIAGNOSIS — E119 Type 2 diabetes mellitus without complications: Secondary | ICD-10-CM | POA: Insufficient documentation

## 2012-12-17 DIAGNOSIS — R079 Chest pain, unspecified: Secondary | ICD-10-CM | POA: Insufficient documentation

## 2012-12-17 DIAGNOSIS — I70219 Atherosclerosis of native arteries of extremities with intermittent claudication, unspecified extremity: Secondary | ICD-10-CM | POA: Insufficient documentation

## 2012-12-17 DIAGNOSIS — I10 Essential (primary) hypertension: Secondary | ICD-10-CM | POA: Insufficient documentation

## 2012-12-17 HISTORY — PX: LEFT HEART CATHETERIZATION WITH CORONARY ANGIOGRAM: SHX5451

## 2012-12-17 HISTORY — DX: Gastro-esophageal reflux disease without esophagitis: K21.9

## 2012-12-17 HISTORY — PX: LOWER EXTREMITY ANGIOGRAM: SHX5508

## 2012-12-17 HISTORY — DX: Peripheral vascular disease, unspecified: I73.9

## 2012-12-17 LAB — CBC
HCT: 32.8 % — ABNORMAL LOW (ref 36.0–46.0)
MCHC: 33.2 g/dL (ref 30.0–36.0)
Platelets: 227 10*3/uL (ref 150–400)
RDW: 14.7 % (ref 11.5–15.5)
WBC: 6 10*3/uL (ref 4.0–10.5)

## 2012-12-17 LAB — BASIC METABOLIC PANEL
Chloride: 98 mEq/L (ref 96–112)
GFR calc Af Amer: 59 mL/min — ABNORMAL LOW (ref >90)
GFR calc non Af Amer: 51 mL/min — ABNORMAL LOW (ref >90)
Potassium: 4.2 mEq/L (ref 3.5–5.1)
Sodium: 136 mEq/L (ref 135–145)

## 2012-12-17 LAB — GLUCOSE, CAPILLARY
Glucose-Capillary: 316 mg/dL — ABNORMAL HIGH (ref 70–99)
Glucose-Capillary: 240 mg/dL — ABNORMAL HIGH (ref 70–99)
Glucose-Capillary: 145 mg/dL — ABNORMAL HIGH (ref 70–99)

## 2012-12-17 LAB — APTT: aPTT: 38 seconds — ABNORMAL HIGH (ref 24–37)

## 2012-12-17 LAB — PROTIME-INR
INR: 0.92 (ref 0.00–1.49)
Prothrombin Time: 12.3 seconds (ref 11.6–15.2)

## 2012-12-17 SURGERY — ANGIOGRAM, LOWER EXTREMITY
Anesthesia: LOCAL

## 2012-12-17 MED ORDER — SODIUM CHLORIDE 0.9 % IJ SOLN
3.0000 mL | Freq: Two times a day (BID) | INTRAMUSCULAR | Status: DC
  Administered 2012-12-17 – 2012-12-18 (×2): 3 mL via INTRAVENOUS

## 2012-12-17 MED ORDER — ASPIRIN 81 MG PO CHEW
CHEWABLE_TABLET | ORAL | Status: AC
  Filled 2012-12-17: qty 4

## 2012-12-17 MED ORDER — SODIUM CHLORIDE 0.9 % IJ SOLN: 3.0000 mL | INTRAMUSCULAR | Status: DC | PRN

## 2012-12-17 MED ORDER — HYDROMORPHONE HCL PF 2 MG/ML IJ SOLN
INTRAMUSCULAR | Status: AC
  Filled 2012-12-17: qty 1

## 2012-12-17 MED ORDER — SODIUM CHLORIDE 0.9 % IV SOLN
INTRAVENOUS | Status: AC
  Administered 2012-12-17: 1000 mL via INTRAVENOUS

## 2012-12-17 MED ORDER — INSULIN ASPART 100 UNIT/ML ~~LOC~~ SOLN
SUBCUTANEOUS | Status: AC
  Filled 2012-12-17: qty 1

## 2012-12-17 MED ORDER — BISACODYL 5 MG PO TBEC
10.0000 mg | DELAYED_RELEASE_TABLET | Freq: Once | ORAL | Status: AC
  Administered 2012-12-17: 10 mg via ORAL
  Filled 2012-12-17: qty 2

## 2012-12-17 MED ORDER — SODIUM CHLORIDE 0.9 % IV SOLN: 250.0000 mL | INTRAVENOUS | Status: DC | PRN

## 2012-12-17 MED ORDER — ONDANSETRON HCL 4 MG/2ML IJ SOLN: 4.0000 mg | Freq: Four times a day (QID) | INTRAMUSCULAR | Status: DC | PRN

## 2012-12-17 MED ORDER — SULFAMETHOXAZOLE-TMP DS 800-160 MG PO TABS
1.0000 | ORAL_TABLET | Freq: Two times a day (BID) | ORAL | Status: DC
  Administered 2012-12-17 – 2012-12-18 (×3): 1 via ORAL
  Filled 2012-12-17 (×4): qty 1

## 2012-12-17 MED ORDER — SODIUM CHLORIDE 0.9 % IV SOLN
INTRAVENOUS | Status: DC
  Administered 2012-12-17: 06:00:00 via INTRAVENOUS

## 2012-12-17 MED ORDER — AMLODIPINE BESYLATE 5 MG PO TABS
5.0000 mg | ORAL_TABLET | Freq: Every day | ORAL | Status: DC
  Administered 2012-12-17 – 2012-12-18 (×2): 5 mg via ORAL
  Filled 2012-12-17 (×2): qty 1

## 2012-12-17 MED ORDER — HEPARIN (PORCINE) IN NACL 2-0.9 UNIT/ML-% IJ SOLN
INTRAMUSCULAR | Status: AC
  Filled 2012-12-17: qty 1500

## 2012-12-17 MED ORDER — ASPIRIN EC 81 MG PO TBEC
81.0000 mg | DELAYED_RELEASE_TABLET | Freq: Every day | ORAL | Status: DC
  Administered 2012-12-17 – 2012-12-18 (×2): 81 mg via ORAL
  Filled 2012-12-17 (×2): qty 1

## 2012-12-17 MED ORDER — FENTANYL CITRATE 0.05 MG/ML IJ SOLN
25.0000 ug | INTRAMUSCULAR | Status: DC | PRN
  Administered 2012-12-17 – 2012-12-18 (×2): 25 ug via INTRAVENOUS
  Filled 2012-12-17 (×2): qty 2

## 2012-12-17 MED ORDER — LIDOCAINE HCL (PF) 1 % IJ SOLN
INTRAMUSCULAR | Status: AC
  Filled 2012-12-17: qty 30

## 2012-12-17 MED ORDER — ACETAMINOPHEN 325 MG PO TABS: 650.0000 mg | ORAL_TABLET | ORAL | Status: DC | PRN

## 2012-12-17 MED ORDER — INSULIN ASPART 100 UNIT/ML ~~LOC~~ SOLN
0.0000 [IU] | Freq: Three times a day (TID) | SUBCUTANEOUS | Status: DC
  Administered 2012-12-17: 2 [IU] via SUBCUTANEOUS
  Administered 2012-12-18: 8 [IU] via SUBCUTANEOUS
  Administered 2012-12-18: 3 [IU] via SUBCUTANEOUS
  Administered 2012-12-18: 8 [IU] via SUBCUTANEOUS

## 2012-12-17 MED ORDER — INSULIN ASPART 100 UNIT/ML ~~LOC~~ SOLN: 0.0000 [IU] | Freq: Every day | SUBCUTANEOUS | Status: DC

## 2012-12-17 MED ORDER — INSULIN REGULAR HUMAN 100 UNIT/ML IJ SOLN
12.0000 [IU] | Freq: Once | INTRAMUSCULAR | Status: AC
  Administered 2012-12-17: 12 [IU] via SUBCUTANEOUS
  Filled 2012-12-17: qty 0.12

## 2012-12-17 MED ORDER — SODIUM CHLORIDE 0.9 % IJ SOLN: 3.0000 mL | Freq: Two times a day (BID) | INTRAMUSCULAR | Status: DC

## 2012-12-17 MED ORDER — INSULIN ASPART 100 UNIT/ML ~~LOC~~ SOLN
20.0000 [IU] | Freq: Once | SUBCUTANEOUS | Status: AC
  Administered 2012-12-17: 20 [IU] via SUBCUTANEOUS

## 2012-12-17 MED ORDER — ASPIRIN 81 MG PO CHEW
324.0000 mg | CHEWABLE_TABLET | ORAL | Status: AC
  Administered 2012-12-17: 324 mg via ORAL

## 2012-12-17 MED ORDER — GLIMEPIRIDE 2 MG PO TABS
2.0000 mg | ORAL_TABLET | Freq: Two times a day (BID) | ORAL | Status: DC
  Administered 2012-12-17 – 2012-12-18 (×4): 2 mg via ORAL
  Filled 2012-12-17 (×5): qty 1

## 2012-12-17 MED ORDER — INSULIN GLARGINE 100 UNIT/ML ~~LOC~~ SOLN: 5.0000 [IU] | Freq: Every day | SUBCUTANEOUS | Status: DC

## 2012-12-17 MED ORDER — FENTANYL CITRATE 0.05 MG/ML IJ SOLN
INTRAMUSCULAR | Status: AC
  Administered 2012-12-17: 25 ug via INTRAVENOUS
  Filled 2012-12-17: qty 2

## 2012-12-17 MED ORDER — MIDAZOLAM HCL 2 MG/2ML IJ SOLN
INTRAMUSCULAR | Status: AC
  Filled 2012-12-17: qty 2

## 2012-12-17 MED ORDER — INSULIN GLARGINE 100 UNIT/ML ~~LOC~~ SOLN
30.0000 [IU] | Freq: Every day | SUBCUTANEOUS | Status: DC
  Administered 2012-12-17: 30 [IU] via SUBCUTANEOUS

## 2012-12-17 MED ORDER — CLOPIDOGREL BISULFATE 75 MG PO TABS
75.0000 mg | ORAL_TABLET | Freq: Every day | ORAL | Status: DC
  Administered 2012-12-17 – 2012-12-18 (×2): 75 mg via ORAL
  Filled 2012-12-17 (×2): qty 1

## 2012-12-17 NOTE — H&P (Signed)
  Please see paper chart  

## 2012-12-17 NOTE — Interval H&P Note (Signed)
History and Physical Interval Note:  12/17/2012 8:43 AM  [name]  has presented today for surgery, with the diagnosis of Claudication  The various methods of treatment have been discussed with the patient and family. After consideration of risks, benefits and other options for treatment, the patient has consented to  Procedure(s) (LRB) with comments: LOWER EXTREMITY ANGIOGRAM (N/A) LEFT HEART CATHETERIZATION WITH CORONARY ANGIOGRAM (N/A) and possible angioplasty as a surgical intervention .  The patient's history has been reviewed, patient examined, no change in status, stable for surgery.  I have reviewed the patient's chart and labs.  Questions were answered to the patient's satisfaction.     Pamella Pert

## 2012-12-17 NOTE — CV Procedure (Signed)
Procedures performed: Right Femoral access Left heart catheterization including left ventriculography, selective right and left coronary arteriography. Abdominal aortogram. Abdominal aortogram with bifemoral arteriogram with distal runoff. Right femoral arteriogram and closure of the right femoral arterial access with Minx.  Indication: Chest pain , dyspnea  DM , HTN . Claudication, abnormal LE arterial duplex. :   HEMODYNAMIC DATA: The left ventricle pressure was 142/9 with  EDP 16. Aortic pressure was  140/68 with a mean of 97 mmHg.  There was no pressure gradient across the aortic valve   Right coronary artery: Right coronary is a small vessel and non-dominant. It is smooth and normal.   Left main coronary artery: Left main coronary artery is a large caliber vessel, which is smooth and normal.   Circumflex: Circumflex is a moderate caliber vessel, giving origin to a  small obtuse marginal 1. Smooth and normal. It is dominant and has mild luminal irregularities.   Ramus intermediate: NA.   LAD: LAD is a large caliber vessel, giving origin to several small  diagonals. It is smooth and normal.   Left ventriculogram: Left ventriculography revealed ejection fraction  of 55-60%. There was no wall motion abnormality.   Peripheral arthrogram: No evidence of abdominal aneurysm. 2 renal arteries one on either side and they're widely patent. The right renal artery has a inferior origin. Aortoiliac bifurcation was widely patent.   Abdominal aortogram with bifemoral arteriogram:  The abdominal aorta showed mild abdominal atherosclerosis. The right internal iliac artery has a high-grade 70-80% ostial stenosis. 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 Left external iliac artery shows a 60-70% stenosis and there is mild to moderate diffuse disease extending into the common femoral artery.  Left SFA is flush occluded, right SFA is occluded in the proximal segment with severe subtotally occluded short  segment SFA is evident. Bilaterally, the profunda femoral arteries are very large and supplied collaterals to the distal SFA bilaterally. Distal SFA shows mild disease. Left popliteal artery at the level of the joint shows a high-grade 70-80% stenosis.   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 No significant disease is evident and the flow is brisk. The right anterior tibial artery has origin about 6-8 inches below the right knee and again there is three-vessel runoff in the right leg with peroneal artery being very small. There is mild disease noted in the small vessels.   IMPRESSIONS:  1. Normal coronary arteries, left dominant circulation. LVEF: 60% 2. 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 Right internal iliac artery has a 60-70% ostial stenosis. Bilateral severe peripheral arterial disease involving long segment occlusion of the bilateral SFA. Left SFA is flush occluded and right SFA origin although percent is severely diseased and fades off quickly. Distal SFA is collateralized by large profunda femoral artery bilaterally.  Recommendation: patient will benefit from aorta bifemoral bypass surgery. She has excellent distal runoff and should do well.  TECHNICAL PROCEDURE: Under sterile precautions using a 6-French right femoral artery access , a 6 [place] multipurpose B2 catheter was advanced via right femoral arterial access. The catheter was advanced into the left ventricle and left ventriculography was performed in the RAO projection.  Same catheter was utilized to perform selective right and left coronary arteriograms was performed and the catheter  was change to a 5 [place] Omni Flush catheter.   Right and left femoral arterial runoff was performed using  the same catheter  followed by withdrawal of the same cath into the right femoral artery over a J-wire. The catheter was then pulled out of the body over J-wire. Access was closed using minx without any complications.

## 2012-12-17 NOTE — Consult Note (Signed)
[facility] [place]  Patient name: [name] MRN: [id number] DOB: 1950/12/13 Sex: female  REASON FOR CONSULT: Bilateral lower extremity claudication. Referred by Dr. Jacinto Halim  HPI: [name] is a 62 y.o. female who underwent aortogram and bilateral lower extremity runoff today. She has a long history of stable bilateral lower extremity claudication. This began gradually in 2012. She states her symptoms have been stable and actually may have improved slightly over the last several months. She experiences claudication in both calves which is brought on by ambulation and relieved with rest. His occurs at one block. She denies any thigh or hip claudication. She denies rest pain or history of nonhealing ulcers.  She has diabetes, hypertension, hypercholesterolemia. She denies any history of myocardial infarction or history of congestive heart failure.  Past Medical History  Diagnosis Date  . CVA (cerebral vascular accident) 01/2011    affected rt side  . Diabetes mellitus   . Hypertension   . Arteriosclerotic cardiovascular disease   . Hypothyroidism   . Hypocholesterolemia   . Renal insufficiency   . Anemia   . Vitamin D deficiency   . Peripheral vascular disease   . GERD (gastroesophageal reflux disease)     Family History  Problem Relation Age of Onset  . Diabetes Mother   . Diabetes Father   . Diabetes Sister   . Diabetes Brother     SOCIAL HISTORY: History  Substance Use Topics  . Smoking status: Former Smoker    Quit date: 02/04/2011  . Smokeless tobacco: Never Used  . Alcohol Use: No   She quit tobacco 2 years ago. She had smoked one pack per day for many years.  No Known Allergies  Current Facility-Administered Medications  Medication Dose Route Frequency Provider Last Rate Last Dose  . 0.9 %  sodium chloride infusion  250 mL Intravenous PRN Pamella Pert, MD      . acetaminophen (TYLENOL) tablet 650 mg  650 mg Oral Q4H PRN Pamella Pert, MD      . amLODipine (NORVASC) tablet 5 mg  5 mg Oral Daily Pamella Pert, MD   5 mg at 12/17/12 1235  . aspirin EC tablet 81 mg  81 mg Oral Daily Pamella Pert, MD   81 mg at 12/17/12 1235  . clopidogrel (PLAVIX) tablet 75 mg  75 mg Oral Q breakfast Pamella Pert, MD   75 mg at 12/17/12 1235  . glimepiride (AMARYL) tablet 2 mg  2 mg Oral BID WC Pamella Pert, MD   2 mg at 12/17/12 1234  . insulin aspart (novoLOG) injection 0-15 Units  0-15 Units Subcutaneous TID WC Pamella Pert, MD   2 Units at 12/17/12 1254  . insulin aspart (novoLOG) injection 0-5 Units  0-5 Units Subcutaneous QHS Pamella Pert, MD      . insulin glargine (LANTUS) injection 30 Units  30 Units Subcutaneous QHS Pamella Pert, MD      . ondansetron [facility]) injection 4 mg  4 mg Intravenous Q6H PRN Pamella Pert, MD      . sodium chloride 0.9 % injection 3 mL  3 mL Intravenous Q12H Pamella Pert, MD      . sodium chloride 0.9 % injection 3 mL  3 mL Intravenous PRN Pamella Pert, MD      . sulfamethoxazole-trimethoprim (BACTRIM DS) 800-160 MG per tablet 1 tablet  1 tablet Oral Q12H Pamella Pert, MD  1 tablet at 12/17/12 1234    REVIEW OF SYSTEMS: Arly.Keller ] denotes positive finding; [  ] denotes negative finding CARDIOVASCULAR:  [ ]  chest pain   [ ]  chest pressure   [ ]  palpitations   [ ]  orthopnea   Arly.Keller ] dyspnea on exertion   Arly.Keller ] claudication-bilateral, calf   [ ]  rest pain   [ ]  DVT   [ ]  phlebitis PULMONARY:   [ ]  productive cough   [ ]  asthma   [ ]  wheezing NEUROLOGIC:   [ ]  weakness  [ ]  paresthesias  [ ]  aphasia  [ ]  amaurosis  [ ]  dizziness HEMATOLOGIC:   [ ]  bleeding problems   [ ]  clotting disorders MUSCULOSKELETAL:  [ ]  joint pain   [ ]  joint swelling [ ]  leg swelling GASTROINTESTINAL: [ ]   blood in stool  [ ]   hematemesis GENITOURINARY:  [ ]   dysuria  [ ]   hematuria PSYCHIATRIC:  [ ]  history of major depression INTEGUMENTARY:  [ ]  rashes  [ ]  ulcers CONSTITUTIONAL:   [ ]  fever   [ ]  chills  PHYSICAL EXAM: Filed Vitals:   12/17/12 0724 12/17/12 1000 12/17/12 1100 12/17/12 1400  BP:  166/79 142/75 127/60  Pulse: 70 65 67 75  Temp:  97.6 F (36.4 C) 97.5 F (36.4 C) 97.6 F (36.4 C)  TempSrc:  Oral Oral Oral  Resp:  18  18  Height:      Weight:      SpO2:  95% 94% 93%   Body mass index is 30.11 kg/(m^2). GENERAL: The patient is a well-nourished female, in no acute distress. The vital signs are documented above. CARDIOVASCULAR: There is a regular rate and rhythm. I do not detect carotid bruits. She has a dressing on her right groin so I cannot assess for her right femoral pulse. She has a palpable left femoral pulse although slightly diminished. I cannot palpate popliteal or pedal pulses. Both feet appear adequately perfused. She has no ischemic ulcers. PULMONARY: There is good air exchange bilaterally without wheezing or rales. ABDOMEN: Soft and non-tender with normal pitched bowel sounds.  She is obese so it is difficult to assess for an aneurysm however I do not palpate an abdominal aortic aneurysm to MUSCULOSKELETAL: There are no major deformities or cyanosis. NEUROLOGIC: No focal weakness or paresthesias are detected. SKIN: There are no ulcers or rashes noted. PSYCHIATRIC: The patient has a normal affect.  DATA:  Lab Results  Component Value Date   WBC 6.0 12/17/2012   HGB 10.9* 12/17/2012   HCT 32.8* 12/17/2012   MCV 87.9 12/17/2012   PLT 227 12/17/2012   Lab Results  Component Value Date   NA 136 12/17/2012   K 4.2 12/17/2012   CL 98 12/17/2012   CO2 25 12/17/2012   Lab Results  Component Value Date   CREATININE 1.15* 12/17/2012   Lab Results  Component Value Date   INR 0.92 12/17/2012   INR 1.17 03/08/2012   INR 0.90 01/27/2011   Lab Results  Component Value Date   HGBA1C 10.5* 03/05/2012   CBG (last 3)   Basename 12/17/12 1138 12/17/12 0850 12/17/12 0757  GLUCAP 145* 170* 240*   I have reviewed her aortogram. She has no significant  aortoiliac occlusive disease. She does have some common femoral artery disease bilaterally but this does not appear to be significant. She appears to have chronic bilateral superficial femoral artery occlusions. On the right side there is reconstitution  of the above-knee popliteal artery with two-vessel runoff on the right via the anterior tibial and peroneal arteries. On the left side there is reconstitution of the popliteal artery at the knee with 3 vessel runoff via the anterior tibial posterior tibial and peroneal arteries. The anterior tibial artery is not well visualized distally in may be occluded distally.  I have reviewed her arterial Doppler study from 03/07/2012. At that time she had an ABI of 75% on the right and 54% on the left. She has had more recent Doppler studies in Dr. Verl Dicker office but I do not have those available.  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 She does not describe rest pain or any history of nonhealing ulcers. Fortunately she quit smoking in 2012. I've explained it typically we would not recommend surgery for claudication and less her symptoms became disabling or her symptoms progressed to the point of rest pain. Likewise, if she developed a nonhealing ulcer we would consider revascularization. However I have recommended conservative treatment for now and we have discussed in detail a structured walking program. Also discussed the importance of her diet and how she should eat more fruits and vegetables and watch her fat intake carefully. I will arrange for my office to call her to arrange a follow up in 6 months. Certainly if her symptoms progressed we can consider staged bilateral femoropopliteal bypass grafts however I have explained that we favor conservative treatment unless the symptoms become disabling given the risks  involved with surgery. He knows to call sooner if her symptoms progress. We will be available as needed.  Eriyanna Kofoed S [facility] [place] Beeper: [phone]

## 2012-12-17 NOTE — Progress Notes (Signed)
PT CBG is 423 today @ 0506PM, pt  has no symptoms and  stable, called attending physician, and obtained an order for 20 U of regular insulin s/c, will continue to monitor patient.

## 2012-12-18 ENCOUNTER — Telehealth: Payer: Self-pay | Admitting: Vascular Surgery

## 2012-12-18 ENCOUNTER — Encounter (HOSPITAL_COMMUNITY): Payer: Self-pay | Admitting: Cardiology

## 2012-12-18 LAB — GLUCOSE, CAPILLARY
Glucose-Capillary: 281 mg/dL — ABNORMAL HIGH (ref 70–99)
Glucose-Capillary: 259 mg/dL — ABNORMAL HIGH (ref 70–99)

## 2012-12-18 LAB — HEMOGLOBIN A1C: Mean Plasma Glucose: 280 mg/dL — ABNORMAL HIGH (ref <117)

## 2012-12-18 MED ORDER — CILOSTAZOL 100 MG PO TABS: 100.0000 mg | ORAL_TABLET | Freq: Two times a day (BID) | ORAL | Status: DC

## 2012-12-18 NOTE — Progress Notes (Signed)
Pt discharged to home per MD order. Pt received and reviewed all discharge instructions and medication information including follow-up appointments and prescriptions. Pt verbalized understanding. Pt alert and oriented at discharge with no complaints of pain.  Pt escorted to private vehicle via wheelchair by nurse tech. Coates, Arisbeth Purrington Elizabeth  

## 2012-12-18 NOTE — Telephone Encounter (Addendum)
Message copied by Shari Prows on Wed Dec 18, 2012 12:35 PM ------      Message from: Lamar Blinks S      Created: Wed Dec 18, 2012  9:45 AM      Regarding: FW: charge and f/u                   ----- Message -----         From: Chuck Hint, MD         Sent: 12/17/2012   5:19 PM           To: Reuel Derby, Melene Plan, RN      Subject: charge and f/u                                           This was a level for consult with claudication. Consult was from Dr. Jacinto Halim. She needs a follow up visit in 6 months. She does not need to be on the list. Thank you. CD  I scheduled an appt for the above patient on 06/18/13 at 1:15pm w/ [facility]. I left a voicemail for the pt and also mailed an appt letter/awt

## 2012-12-18 NOTE — Discharge Summary (Signed)
Physician Discharge Summary  Patient ID: [name] MRN: [id number] DOB/AGE: 1951/04/09 62 y.o.  Admit date: 12/17/2012 Discharge date: 12/18/2012  Primary Discharge Diagnosis PAD with lifestyle limiting claudication Secondary Discharge Diagnosis DM with PAD. Hypertension Hyperlipidemia  Significant Diagnostic Studies: 12/17/12: Left heart catheterization including left ventriculography, selective right and left coronary arteriography. Abdominal aortogram. Abdominal aortogram with bifemoral arteriogram with distal runoff. Right femoral arteriogram and closure of the right femoral arterial access with Minx   Consults: Vascular consultation Dr. Cari Caraway for possible aorto bifem bypass. if her symptoms progressed we can consider staged bilateral femoropopliteal bypass grafts however I have explained that we favor conservative treatment unless the symptoms become disabling given the risks involved with surgery.   Hospital Course: Patient was electively brought to the hospital for peripheral angiography and also the evaluation of coronary artery disease due to persistent chest discomfort. She was kept overnight do to social issues and there was no care to be provided at home. The following day she was stable with groin has healed well without any hematoma and was felt stable for discharge.   Recommendations on discharge: Patient will follow with me as previously scheduled in 2 weeks.  Discharge Exam: Blood pressure 138/56, pulse 66, temperature 98.3 F (36.8 C), temperature source Oral, resp. rate 20, height 5\' 3"  (1.6 m), weight 79.561 kg (175 lb 6.4 oz), SpO2 95.00%.  Cardiac exam: S1-S2 is normal without any gallop or murmur Chest clear without any rhonchi Abdomen soft sounds heard in all 4 quadrants, right groin site without hematoma Extremity without any deeper and full range of movements    Labs:   Lab Results  Component Value Date   WBC 6.0 12/17/2012   HGB 10.9* 12/17/2012   HCT 32.8* 12/17/2012   MCV 87.9 12/17/2012   PLT 227 12/17/2012    Lab 12/17/12 0542  NA 136  K 4.2  CL 98  CO2 25  BUN 20  CREATININE 1.15*  CALCIUM 10.0  PROT --  BILITOT --  ALKPHOS --  ALT --  AST --  GLUCOSE 313*   Lab Results  Component Value Date   CKTOTAL 221* 03/06/2012   CKMB 2.7 03/06/2012   TROPONINI <0.30 03/06/2012    Lipid Panel     Component Value Date/Time   CHOL 223* 03/05/2012 2213   TRIG 180* 03/05/2012 2213   HDL 44 03/05/2012 2213   CHOLHDL 5.1 03/05/2012 2213   VLDL 36 03/05/2012 2213   LDLCALC 143* 03/05/2012 2213    FOLLOW UP PLANS AND APPOINTMENTS    Medication List     As of 12/18/2012  9:09 AM    STOP taking these medications         isosorbide mononitrate 60 MG 24 hr tablet   Commonly known as: IMDUR      TAKE these medications         amLODipine 5 MG tablet   Commonly known as: NORVASC   Take 5 mg by mouth every morning.      aspirin EC 81 MG tablet   Take 81 mg by mouth every morning.      cilostazol 100 MG tablet   Commonly known as: PLETAL   Take 1 tablet (100 mg total) by mouth 2 (two) times daily.      clopidogrel 75 MG tablet   Commonly known as: PLAVIX   Take 75 mg by mouth every morning.      colesevelam 625 MG tablet   Commonly known  asLilian Kapur   Take 3,750 mg by mouth daily after supper.      glimepiride 2 MG tablet   Commonly known as: AMARYL   Take 2 mg by mouth 2 (two) times daily.      insulin glargine 100 UNIT/ML injection   Commonly known as: LANTUS   Inject 30 Units into the skin at bedtime.      levothyroxine 100 MCG tablet   Commonly known as: SYNTHROID, LEVOTHROID   Take 100 mcg by mouth daily.      MULTIVITAMIN PO   Take 1 tablet by mouth daily.           Follow-up Information    Follow up with Pamella Pert, MD. In 2 weeks. (Patient has appointment)    Contact information:   [address] [place] [address] [phone]           Pamella Pert, MD 12/18/2012,  9:09 AM  Pager: [phone] Office: [phone] If no answer: [phone]

## 2012-12-18 NOTE — Progress Notes (Signed)
MD notified of pt having muscle spasms in the LLQ. LBM Saturday & bowel sounds active in all four quadarents. Dulcolax was ordered. MD was also notified of pt having a severe muscle spasm in her left leg as well as pain right behind her knee. Pt was standing up at bedside leaning over the table screaming in pain. Fentanyl 25 mcg q3 hrs prn was ordered. Will continue to monitor the pt. Sanda Linger

## 2013-04-02 ENCOUNTER — Emergency Department (HOSPITAL_COMMUNITY)
Admission: EM | Admit: 2013-04-02 | Discharge: 2013-04-02 | Disposition: A | Discharge status: Home / Self Care | Payer: Medicare Other | Attending: Emergency Medicine | Admitting: Emergency Medicine

## 2013-04-02 ENCOUNTER — Emergency Department (HOSPITAL_COMMUNITY): Payer: Medicare Other

## 2013-04-02 ENCOUNTER — Encounter (HOSPITAL_COMMUNITY): Payer: Self-pay | Admitting: Emergency Medicine

## 2013-04-02 ENCOUNTER — Other Ambulatory Visit: Payer: Self-pay

## 2013-04-02 DIAGNOSIS — E039 Hypothyroidism, unspecified: Secondary | ICD-10-CM | POA: Insufficient documentation

## 2013-04-02 DIAGNOSIS — R0602 Shortness of breath: Secondary | ICD-10-CM | POA: Insufficient documentation

## 2013-04-02 DIAGNOSIS — R51 Headache: Secondary | ICD-10-CM | POA: Insufficient documentation

## 2013-04-02 DIAGNOSIS — Z87891 Personal history of nicotine dependence: Secondary | ICD-10-CM | POA: Insufficient documentation

## 2013-04-02 DIAGNOSIS — R079 Chest pain, unspecified: Secondary | ICD-10-CM | POA: Insufficient documentation

## 2013-04-02 DIAGNOSIS — Z8719 Personal history of other diseases of the digestive system: Secondary | ICD-10-CM | POA: Insufficient documentation

## 2013-04-02 DIAGNOSIS — R1013 Epigastric pain: Secondary | ICD-10-CM

## 2013-04-02 DIAGNOSIS — I10 Essential (primary) hypertension: Secondary | ICD-10-CM | POA: Insufficient documentation

## 2013-04-02 DIAGNOSIS — Z87448 Personal history of other diseases of urinary system: Secondary | ICD-10-CM | POA: Insufficient documentation

## 2013-04-02 DIAGNOSIS — Z8679 Personal history of other diseases of the circulatory system: Secondary | ICD-10-CM | POA: Insufficient documentation

## 2013-04-02 DIAGNOSIS — E7800 Pure hypercholesterolemia, unspecified: Secondary | ICD-10-CM | POA: Insufficient documentation

## 2013-04-02 DIAGNOSIS — Z8639 Personal history of other endocrine, nutritional and metabolic disease: Secondary | ICD-10-CM | POA: Insufficient documentation

## 2013-04-02 DIAGNOSIS — Z8673 Personal history of transient ischemic attack (TIA), and cerebral infarction without residual deficits: Secondary | ICD-10-CM | POA: Insufficient documentation

## 2013-04-02 DIAGNOSIS — Z7982 Long term (current) use of aspirin: Secondary | ICD-10-CM | POA: Insufficient documentation

## 2013-04-02 DIAGNOSIS — Z7902 Long term (current) use of antithrombotics/antiplatelets: Secondary | ICD-10-CM | POA: Insufficient documentation

## 2013-04-02 DIAGNOSIS — E119 Type 2 diabetes mellitus without complications: Secondary | ICD-10-CM | POA: Insufficient documentation

## 2013-04-02 DIAGNOSIS — Z794 Long term (current) use of insulin: Secondary | ICD-10-CM | POA: Insufficient documentation

## 2013-04-02 DIAGNOSIS — Z79899 Other long term (current) drug therapy: Secondary | ICD-10-CM | POA: Insufficient documentation

## 2013-04-02 DIAGNOSIS — Z9851 Tubal ligation status: Secondary | ICD-10-CM | POA: Insufficient documentation

## 2013-04-02 DIAGNOSIS — R110 Nausea: Secondary | ICD-10-CM | POA: Insufficient documentation

## 2013-04-02 DIAGNOSIS — Z862 Personal history of diseases of the blood and blood-forming organs and certain disorders involving the immune mechanism: Secondary | ICD-10-CM | POA: Insufficient documentation

## 2013-04-02 LAB — CBC
HCT: 36.5 % (ref 36.0–46.0)
MCH: 27.9 pg (ref 26.0–34.0)
MCV: 85.7 fL (ref 78.0–100.0)
Platelets: 244 10*3/uL (ref 150–400)
RBC: 4.26 MIL/uL (ref 3.87–5.11)
WBC: 9.6 10*3/uL (ref 4.0–10.5)

## 2013-04-02 LAB — POCT I-STAT TROPONIN I: Troponin i, poc: 0 ng/mL (ref 0.00–0.08)

## 2013-04-02 LAB — PRO B NATRIURETIC PEPTIDE: Pro B Natriuretic peptide (BNP): 7.7 pg/mL (ref 0–125)

## 2013-04-02 LAB — URINALYSIS, ROUTINE W REFLEX MICROSCOPIC
Bilirubin Urine: NEGATIVE
Glucose, UA: 250 mg/dL — AB
Hgb urine dipstick: NEGATIVE
Ketones, ur: NEGATIVE mg/dL
Nitrite: NEGATIVE
Specific Gravity, Urine: 1.021 (ref 1.005–1.030)
pH: 5.5 (ref 5.0–8.0)

## 2013-04-02 LAB — BASIC METABOLIC PANEL
BUN: 26 mg/dL — ABNORMAL HIGH (ref 6–23)
CO2: 23 mEq/L (ref 19–32)
Calcium: 9.8 mg/dL (ref 8.4–10.5)
Chloride: 99 mEq/L (ref 96–112)
Creatinine, Ser: 1.23 mg/dL — ABNORMAL HIGH (ref 0.50–1.10)
Glucose, Bld: 247 mg/dL — ABNORMAL HIGH (ref 70–99)

## 2013-04-02 MED ORDER — OMEPRAZOLE 20 MG PO CPDR: 20.0000 mg | DELAYED_RELEASE_CAPSULE | Freq: Two times a day (BID) | ORAL | Status: DC

## 2013-04-02 MED ORDER — FLUCONAZOLE 150 MG PO TABS: 150.0000 mg | ORAL_TABLET | Freq: Once | ORAL | Status: DC

## 2013-04-02 MED ORDER — GI COCKTAIL ~~LOC~~
30.0000 mL | Freq: Once | ORAL | Status: AC
  Administered 2013-04-02: 30 mL via ORAL
  Filled 2013-04-02: qty 30

## 2013-04-02 MED ORDER — NITROGLYCERIN 0.4 MG SL SUBL: 0.4000 mg | SUBLINGUAL_TABLET | SUBLINGUAL | Status: DC | PRN

## 2013-04-02 MED ORDER — HYDROCODONE-ACETAMINOPHEN 5-325 MG PO TABS: 2.0000 | ORAL_TABLET | ORAL | Status: DC | PRN

## 2013-04-02 MED ORDER — ASPIRIN 325 MG PO TABS
325.0000 mg | ORAL_TABLET | ORAL | Status: AC
  Administered 2013-04-02: 325 mg via ORAL
  Filled 2013-04-02: qty 1

## 2013-04-02 NOTE — ED Notes (Signed)
Pt denies having chest pain. Pt states she is having abdominal pain since Friday. Pt states she feels itching in her chest. Denies SOB. Pt states she gets nauseated from time to time.

## 2013-04-02 NOTE — ED Notes (Signed)
MD at bedside. 

## 2013-04-02 NOTE — ED Notes (Signed)
Patient transported to X-ray 

## 2013-04-02 NOTE — ED Provider Notes (Signed)
History     CSN: [id number]  Arrival date & time 04/02/13  0453   First MD Initiated Contact with Patient 04/02/13 0505      Chief Complaint  Patient presents with  . Chest Pain    (Consider location/radiation/quality/duration/timing/severity/associated sxs/prior treatment) HPI Comments: Patient woke from sleep with complaints of burning in the upper abdomen, feels short of breath and nausea.  She denies any exertional symptoms, fever, chills, cough.  She does complain of headache, burning in her bladder.    Patient is a 62 y.o. female presenting with chest pain. The history is provided by the patient.  Chest Pain Pain location:  Epigastric Pain quality: burning   Pain radiates to:  Does not radiate Pain radiates to the back: no   Pain severity:  Moderate Duration:  4 hours Timing:  Constant Progression:  Worsening Chronicity:  New Context comment:  While sleeping Relieved by:  Nothing Worsened by:  Nothing tried Ineffective treatments:  None tried   Past Medical History  Diagnosis Date  . CVA (cerebral vascular accident) 01/2011    affected rt side  . Diabetes mellitus   . Hypertension   . Arteriosclerotic cardiovascular disease   . Hypothyroidism   . Hypocholesterolemia   . Renal insufficiency   . Anemia   . Vitamin D deficiency   . Peripheral vascular disease   . GERD (gastroesophageal reflux disease)   . Claudication in peripheral vascular disease     Past Surgical History  Procedure Laterality Date  . Tubal ligation    . Cesarean section      Family History  Problem Relation Age of Onset  . Diabetes Mother   . Diabetes Father   . Diabetes Sister   . Diabetes Brother     History  Substance Use Topics  . Smoking status: Former Smoker    Quit date: 02/04/2011  . Smokeless tobacco: Never Used  . Alcohol Use: No    OB History   Grav Para Term Preterm Abortions TAB SAB Ect Mult Living                  Review of Systems  Cardiovascular:  Positive for chest pain.  All other systems reviewed and are negative.    Allergies  Review of patient's allergies indicates no known allergies.  Home Medications   Current Outpatient Rx  Name  Route  Sig  Dispense  Refill  . amLODipine (NORVASC) 5 MG tablet   Oral   Take 5 mg by mouth every morning.          Marland Kitchen aspirin EC 81 MG tablet   Oral   Take 81 mg by mouth every morning.          . cilostazol (PLETAL) 100 MG tablet   Oral   Take 1 tablet (100 mg total) by mouth 2 (two) times daily.   60 tablet   2   . clopidogrel (PLAVIX) 75 MG tablet   Oral   Take 75 mg by mouth every morning.          . colesevelam (WELCHOL) 625 MG tablet   Oral   Take 3,750 mg by mouth daily after supper.         Marland Kitchen glimepiride (AMARYL) 2 MG tablet   Oral   Take 2 mg by mouth 2 (two) times daily.         . insulin glargine (LANTUS) 100 UNIT/ML injection   Subcutaneous   Inject 30 Units  into the skin at bedtime.          Marland Kitchen levothyroxine (SYNTHROID, LEVOTHROID) 100 MCG tablet   Oral   Take 100 mcg by mouth daily.         . Multiple Vitamins-Minerals (MULTIVITAMIN PO)   Oral   Take 1 tablet by mouth daily.           BP 137/60  Pulse 84  Temp(Src) 99 F (37.2 C) (Oral)  Resp 18  SpO2 97%  Physical Exam  Nursing note and vitals reviewed. Constitutional: She is oriented to person, place, and time. She appears well-developed and well-nourished. No distress.  HENT:  Head: Normocephalic and atraumatic.  Neck: Normal range of motion. Neck supple.  Cardiovascular: Normal rate and regular rhythm.  Exam reveals no gallop and no friction rub.   No murmur heard. Pulmonary/Chest: Effort normal and breath sounds normal. No respiratory distress. She has no wheezes.  Abdominal: Soft. Bowel sounds are normal. She exhibits no distension.  There is ttp in the epigastric region with no rebound or guarding.  Musculoskeletal: Normal range of motion.  Neurological: She is alert  and oriented to person, place, and time.  Skin: Skin is warm and dry. She is not diaphoretic.    ED Course  Procedures (including critical care time)  Labs Reviewed  CBC  BASIC METABOLIC PANEL  PRO B NATRIURETIC PEPTIDE  URINALYSIS, ROUTINE W REFLEX MICROSCOPIC  LIPASE, BLOOD   Dg Chest 2 View  04/02/2013   *RADIOLOGY REPORT*  Clinical Data: Chest pain.  CHEST - 2 VIEW  Comparison: 03/05/2012.  Findings: The cardiac silhouette, mediastinal and hilar contours are normal and stable.  The lungs are clear.  No pleural effusion. The bony thorax is intact.  IMPRESSION: No acute cardiopulmonary findings.   Original Report Authenticated By: Rudie Meyer, M.D.     No diagnosis found.   Date: 04/02/2013  Rate: 87  Rhythm: normal sinus rhythm  QRS Axis: normal  Intervals: normal  ST/T Wave abnormalities: nonspecific T wave changes  Conduction Disutrbances:none  Narrative Interpretation:   Old EKG Reviewed: unchanged    MDM  The patient presents with multiple complaints, most notably epigastric discomfort.  She describes this as a "burning" and it started while asleep.  The symptoms improved with a GI cocktail and I suspect a GI etiology.  I will prescribe prilosec and pain meds.  She also complains of headache, and believes she may have a yeast infection.  I do not feel as though further workup at this point.  As she has had yeast infections in the past, I will prescribe diflucan.  Return prn.        Geoffery Lyons, MD 04/02/13 [phone]

## 2013-04-02 NOTE — ED Notes (Signed)
PT. REPORTS INTERMITTENT MID CHEST PAIN FOR 1 WEEK WITH SOB , NAUSEA , AND HEADACHE .

## 2013-05-20 ENCOUNTER — Other Ambulatory Visit: Payer: Self-pay

## 2013-05-20 DIAGNOSIS — Z1231 Encounter for screening mammogram for malignant neoplasm of breast: Secondary | ICD-10-CM

## 2013-06-13 ENCOUNTER — Ambulatory Visit: Payer: Medicare Other

## 2013-06-17 ENCOUNTER — Encounter: Payer: Self-pay | Admitting: Vascular Surgery

## 2013-06-18 ENCOUNTER — Encounter: Payer: Self-pay | Admitting: Vascular Surgery

## 2013-06-18 ENCOUNTER — Ambulatory Visit (INDEPENDENT_AMBULATORY_CARE_PROVIDER_SITE_OTHER): Payer: Medicare Other | Admitting: Vascular Surgery

## 2013-06-18 VITALS — BP 132/48 | HR 71 | Resp 18 | Ht 62.0 in | Wt 174.0 lb

## 2013-06-18 DIAGNOSIS — I739 Peripheral vascular disease, unspecified: Secondary | ICD-10-CM

## 2013-06-18 NOTE — Assessment & Plan Note (Signed)
This patient has stable bilateral lower extremity claudication secondary to bilateral superficial femoral artery occlusions. She quit smoking 2 years ago. Her symptoms have been improving and she tries to ambulate as much as possible. I've explained that I would only consider revascularization if she developed disabling claudication, rest pain, or nonhealing ulcer. He is followed closely by Jacinto Halim who is a follows her cholesterol and high blood pressure. I'll be happy to see her back at any time if her symptoms progress. However I think it makes the most sense her to continue follow up with Dr. Jacinto Halim concerning her claudication.

## 2013-06-18 NOTE — Progress Notes (Signed)
[facility]  Patient name: [name] MRN: [id number] DOB: 1951/03/15 Sex: female  REASON FOR VISIT: follow up of claudication  HPI: [name] is a 62 y.o. female who I had seen in consultation in the hospital on 12/17/2012. She has a history of bilateral lower extremity claudication. She had undergone an arteriogram by Dr. Jacinto Halim. This showed no significant aortoiliac occlusive disease. She had chronic bilateral superficial femoral artery occlusions. She comes in now for a 6 month follow up visit.  Since I saw her in the hospital, she states her symptoms have actually improved somewhat. Initially her symptoms were more significant on the left side but her symptoms are now equal on both sides. She can walk approximately 50 yards before experiencing bilateral calf claudication. Her symptoms are relieved with rest. There is minimal thigh or hip claudication. She denies any history of rest pain or history of nonhealing ulcers. She quit smoking 2 years ago.  Past Medical History  Diagnosis Date  . CVA (cerebral vascular accident) 01/2011    affected rt side  . Diabetes mellitus   . Hypertension   . Arteriosclerotic cardiovascular disease   . Hypothyroidism   . Hypocholesterolemia   . Renal insufficiency   . Anemia   . Vitamin D deficiency   . Peripheral vascular disease   . GERD (gastroesophageal reflux disease)   . Claudication in peripheral vascular disease    Family History  Problem Relation Age of Onset  . Diabetes Mother   . Diabetes Father   . Diabetes Sister   . Diabetes Brother    SOCIAL HISTORY: History  Substance Use Topics  . Smoking status: Former Smoker    Quit date: 02/04/2011  . Smokeless tobacco: Never Used  . Alcohol Use: No   No Known Allergies  REVIEW OF SYSTEMS: Arly.Keller ] denotes positive finding; [  ] denotes negative finding  CARDIOVASCULAR:  [ ]  chest pain   [ ]  chest pressure   [ ]  palpitations   [ ]  orthopnea   [ ]   dyspnea on exertion   Arly.Keller ] claudication   [ ]  rest pain   [ ]  DVT   [ ]  phlebitis PULMONARY:   [ ]  productive cough   [ ]  asthma   [ ]  wheezing NEUROLOGIC:   [ ]  weakness  [ ]  paresthesias  [ ]  aphasia  [ ]  amaurosis  [ ]  dizziness HEMATOLOGIC:   [ ]  bleeding problems   [ ]  clotting disorders MUSCULOSKELETAL:  [ ]  joint pain   [ ]  joint swelling [ ]  leg swelling GASTROINTESTINAL: [ ]   blood in stool  [ ]   hematemesis GENITOURINARY:  [ ]   dysuria  [ ]   hematuria PSYCHIATRIC:  [ ]  history of major depression INTEGUMENTARY:  [ ]  rashes  [ ]  ulcers CONSTITUTIONAL:  [ ]  fever   [ ]  chills  PHYSICAL EXAM: Filed Vitals:   06/18/13 1329  BP: 132/48  Pulse: 71  Resp: 18  Height: 5\' 2"  (1.575 m)  Weight: 174 lb (78.926 kg)   Body mass index is 31.82 kg/(m^2). GENERAL: The patient is a well-nourished female, in no acute distress. The vital signs are documented above. CARDIOVASCULAR: There is a regular rate and rhythm. I do not detect carotid bruits. She has palpable femoral pulses. I cannot palpate popliteal or pedal pulses. He has no significant lower extremity swelling. PULMONARY: There is good air exchange bilaterally without wheezing or rales. ABDOMEN: Soft and  non-tender with normal pitched bowel sounds.  MUSCULOSKELETAL: There are no major deformities or cyanosis. NEUROLOGIC: No focal weakness or paresthesias are detected. SKIN: There are no ulcers or rashes noted. PSYCHIATRIC: The patient has a normal affect.  DATA:  I reviewed her previous arteriogram which shows bilateral superficial femoral artery occlusions. She has two-vessel runoff on the right via the anterior tibial and peroneal arteries. On the left side she has 3 vessel runoff.  MEDICAL ISSUES:  PVD (peripheral vascular disease) This patient has stable bilateral lower extremity claudication secondary to bilateral superficial femoral artery occlusions. She quit smoking 2 years ago. Her symptoms have been improving and she  tries to ambulate as much as possible. I've explained that I would only consider revascularization if she developed disabling claudication, rest pain, or nonhealing ulcer. He is followed closely by Jacinto Halim who is a follows her cholesterol and high blood pressure. I'll be happy to see her back at any time if her symptoms progress. However I think it makes the most sense her to continue follow up with Dr. Jacinto Halim concerning her claudication.   DICKSON,CHRISTOPHER S [facility]: [phone]

## 2013-06-24 ENCOUNTER — Ambulatory Visit
Admission: RE | Admit: 2013-06-24 | Discharge: 2013-06-24 | Disposition: A | Discharge status: Home / Self Care | Payer: Medicare Other | Source: Ambulatory Visit

## 2013-06-24 DIAGNOSIS — Z1231 Encounter for screening mammogram for malignant neoplasm of breast: Secondary | ICD-10-CM

## 2013-09-15 ENCOUNTER — Encounter: Payer: Self-pay | Admitting: Nurse Practitioner

## 2013-09-18 ENCOUNTER — Ambulatory Visit (INDEPENDENT_AMBULATORY_CARE_PROVIDER_SITE_OTHER): Payer: Medicare Other | Admitting: Physician Assistant

## 2013-09-18 ENCOUNTER — Encounter: Payer: Self-pay | Admitting: Physician Assistant

## 2013-09-18 VITALS — BP 150/74 | HR 87 | Ht 62.0 in | Wt 173.8 lb

## 2013-09-18 DIAGNOSIS — Z7901 Long term (current) use of anticoagulants: Secondary | ICD-10-CM

## 2013-09-18 DIAGNOSIS — K5900 Constipation, unspecified: Secondary | ICD-10-CM

## 2013-09-18 DIAGNOSIS — Z1211 Encounter for screening for malignant neoplasm of colon: Secondary | ICD-10-CM

## 2013-09-18 NOTE — Progress Notes (Signed)
Subjective:    Patient ID: [name], female    DOB: [birth year], 62 y.o.   MRN: [id number]  HPI  [name] is a pleasant 62 year old African American female known to Dr. Marina Goodell who comes in today to discuss screening colonoscopy. Patient was last seen about a year ago. She does have history of chronic GERD, peripheral vascular disease with bilateral lower to terminate claudication, adult onset diabetes mellitus, hypertension, and is status post CVA x2. She is currently maintained on Plavix, Pletal, and aspirin. Her cardiologist is Dr. Charleston Poot. She has no current complaints other than constipation which she manages with milk of magnesia. She says that he tried MiraLax in the past without any benefit. She denies any abdominal pain, no melena or hematochezia. Patient had undergone prior colonoscopy in 2004 by Dr. Evette Cristal which was a normal exam with the exception of a prominent ileocecal valve which was biopsied and returned showing benign small bowel mucosa. EGD was also done at that same time showing mild gastritis. Patient has no family history of colon cancer that she is aware of.     Review of Systems  HENT: Negative.   Eyes: Negative.   Respiratory: Negative.   Cardiovascular: Negative.   Gastrointestinal: Positive for constipation.  Endocrine: Negative.   Genitourinary: Negative.   Musculoskeletal: Positive for gait problem.  Skin: Negative.   Allergic/Immunologic: Negative.   Neurological: Positive for weakness.  Hematological: Negative.   Psychiatric/Behavioral: Negative.    Outpatient Prescriptions Prior to Visit  Medication Sig Dispense Refill  . amLODipine (NORVASC) 10 MG tablet Take 10 mg by mouth daily.      Marland Kitchen aspirin EC 81 MG tablet Take 81 mg by mouth daily.      . cilostazol (PLETAL) 100 MG tablet Take 100 mg by mouth 2 (two) times daily as needed (muscle spasms).      . clopidogrel (PLAVIX) 75 MG tablet Take 75 mg by mouth every morning.       . fish oil-omega-3 fatty  acids 1000 MG capsule Take 2 g by mouth 2 (two) times daily.      Marland Kitchen glimepiride (AMARYL) 2 MG tablet Take 2 mg by mouth 2 (two) times daily.      . insulin aspart protamine- aspart (NOVOLOG 70/30) (70-30) 100 UNIT/ML injection Inject 20 Units into the skin 2 (two) times daily with a meal.      . levothyroxine (SYNTHROID, LEVOTHROID) 125 MCG tablet Take 125 mcg by mouth daily before breakfast.      . linagliptin (TRADJENTA) 5 MG TABS tablet Take 5 mg by mouth daily.      Marland Kitchen lisinopril (PRINIVIL,ZESTRIL) 10 MG tablet Take 10 mg by mouth at bedtime.      . Multiple Vitamin (MULTIVITAMIN WITH MINERALS) TABS tablet Take 1 tablet by mouth daily.      . pravastatin (PRAVACHOL) 40 MG tablet Take 40 mg by mouth daily.      . ranitidine (ZANTAC) 150 MG tablet Take 150 mg by mouth daily as needed for heartburn.      . fluconazole (DIFLUCAN) 150 MG tablet Take 1 tablet (150 mg total) by mouth once.  1 tablet  0  . furosemide (LASIX) 40 MG tablet Take 40 mg by mouth daily as needed (excessive fluid).      Marland Kitchen HYDROcodone-acetaminophen (NORCO) 5-325 MG per tablet Take 2 tablets by mouth every 4 (four) hours as needed for pain.  15 tablet  0  . omeprazole (PRILOSEC) 20 MG capsule Take 1  capsule (20 mg total) by mouth 2 (two) times daily.  30 capsule  0   No facility-administered medications prior to visit.   No Known Allergies Patient Active Problem List   Diagnosis Date Noted  . Chronic anticoagulation 09/18/2013  . PVD (peripheral vascular disease) 06/18/2013  . Chest pain 03/05/2012  . Dyspnea on exertion 03/05/2012  . HYPOTHYROIDISM, UNSPECIFIED 01/10/2007  . DIABETES MELLITUS, II, COMPLICATIONS 01/10/2007  . HYPERCHOLESTEROLEMIA 01/10/2007  . TOBACCO DEPENDENCE 01/10/2007  . HYPERTENSION, BENIGN SYSTEMIC 01/10/2007  . CVA 01/10/2007  . GAIT, ABNORMAL 01/10/2007  . PROTEINURIA 01/10/2007   History  Substance Use Topics  . Smoking status: Former Smoker    Quit date: 02/04/2011  . Smokeless  tobacco: Never Used  . Alcohol Use: No   Ms. Jiggetts had no medications administered during this visit.      Objective:   Physical Exam  WD AA female in NAD U. or so he 4 pulse 87 height 5 foot 2 weight 173, HEENT; nontraumatic normocephalic EOMI PERRLA sclera anicteric, Supple; no JVD, Cardiovascular ;regular rate and rhythm with S1-S2 she does have a soft systolic murmur pulmonary clear bilaterally, Abdomen ;soft nontender nondistended bowel sounds are active there is no palpable mass or hepatosplenomegaly, Rectal; exam not done, Extremities; no clubbing cyanosis or edema skin warm and dry, Psych; mood and affect normal and appropriate        Assessment & Plan:  #49  62 year old [place] American female evaluated for colon neoplasia screening. Average risk and negative colonoscopy 10 years ago. #2 peripheral vascular disease with bilateral lower truly claudication #3 status post CVA x2 #3 adult-onset diabetes mellitus #4 hypertension #5 hyperlipidemia #6 chronic antiplatelet therapy with aspirin, Plavix, and Pletal  Plan; Patient is not a good candidate to come  off of  antiplatelet medicines given above history. We will investigate coverage for virtual colonoscopy and if this cannot be done then would proceed with Cologuard screening. Risk-benefit considerations discussed in detail with the patient.  We will send a   letter to  Dr. Charleston Poot regarding his advice on holding antiplatelet therapy for a potential colonoscopy though again would prefer not to schedule and proceed with one of the other screening measures as outlined above.

## 2013-09-18 NOTE — Progress Notes (Signed)
Agree with virtual colonoscopy, if possible. However, another alternative would be to perform colonoscopy on antiplatelet agents, with usual limitations understood

## 2013-09-18 NOTE — Patient Instructions (Signed)
We will contact you once we find out if Medicaid will cover a Virtual colonoscopy. We will contact Medicaid and do a prior authorization.

## 2013-11-19 ENCOUNTER — Telehealth: Payer: Self-pay | Admitting: *Deleted

## 2013-11-19 NOTE — Telephone Encounter (Signed)
Message copied by Hulan Saas on Wed Nov 19, 2013  2:28 PM ------      Message from: [place], [place] S      Created: Wed Nov 19, 2013  9:42 AM       Rollene Fare- I saw this pt in Nov for colon screening-poor colonoscopy candidate and anticoagulated- tried to get her pre-approved  For virtual colonoscopy but unable... Lets do Cologuard stool DNA screening- please call pt and set up- let her know it takes a couple weeks to get a result, and if negative she wont have to have a colonoscopy-thanks ------

## 2013-11-19 NOTE — Telephone Encounter (Signed)
Line busy - will try again later

## 2013-11-20 ENCOUNTER — Encounter: Payer: Self-pay | Admitting: *Deleted

## 2013-11-20 NOTE — Telephone Encounter (Signed)
Line busy again. No other number listed for patient. Mailed a letter to patient requesting she call us to discuss Cologuard screening.

## 2013-11-24 ENCOUNTER — Encounter: Payer: Self-pay | Admitting: *Deleted

## 2013-11-24 NOTE — Telephone Encounter (Signed)
error 

## 2013-11-25 ENCOUNTER — Encounter: Payer: Self-pay | Admitting: Internal Medicine

## 2013-11-25 ENCOUNTER — Telehealth: Payer: Self-pay | Admitting: *Deleted

## 2013-11-25 DIAGNOSIS — Z1211 Encounter for screening for malignant neoplasm of colon: Secondary | ICD-10-CM

## 2013-11-25 NOTE — Telephone Encounter (Signed)
Spoke with patient and she is calling about the letter she received. Spoke with Dr. Henrene Pastor and patient is to have colonoscopy at [facility] and may hold Plavix 5 days prior but continue ASA per her cardiologist.  Damaris Schooner with patient and scheduled propofol colonoscopy on 12/30/13 at 11:30 AM. Pre visit on 12/04/13 at 10:00 AM.

## 2013-12-03 ENCOUNTER — Telehealth: Payer: Self-pay | Admitting: *Deleted

## 2013-12-03 NOTE — Telephone Encounter (Signed)
Per phone note 11/25/13, patient is to stay on ASA, but hold Plavix for 5 days before screening colonoscopy on 12-30-13. Patient is also on Pletal, should this be continued or held? Please advise, thanks Pre-visit.

## 2013-12-03 NOTE — Telephone Encounter (Signed)
Noted. Thanks.

## 2013-12-03 NOTE — Telephone Encounter (Signed)
Continue pletal. Thanks

## 2013-12-04 ENCOUNTER — Encounter: Payer: Self-pay | Admitting: Internal Medicine

## 2013-12-04 ENCOUNTER — Ambulatory Visit (AMBULATORY_SURGERY_CENTER): Payer: Medicare Other | Admitting: *Deleted

## 2013-12-04 VITALS — Ht 62.0 in | Wt 177.6 lb

## 2013-12-04 DIAGNOSIS — Z1211 Encounter for screening for malignant neoplasm of colon: Secondary | ICD-10-CM

## 2013-12-04 MED ORDER — MOVIPREP 100 G PO SOLR: 1.0000 | Freq: Once | ORAL | Status: DC

## 2013-12-04 NOTE — Progress Notes (Signed)
Denies allergies to eggs or soy products. Denies complications with sedation or anesthesia. 

## 2013-12-30 ENCOUNTER — Encounter: Payer: Medicare Other | Admitting: Internal Medicine

## 2014-02-28 DIAGNOSIS — E559 Vitamin D deficiency, unspecified: Secondary | ICD-10-CM | POA: Diagnosis present

## 2014-02-28 DIAGNOSIS — Z9851 Tubal ligation status: Secondary | ICD-10-CM

## 2014-02-28 DIAGNOSIS — Z87891 Personal history of nicotine dependence: Secondary | ICD-10-CM

## 2014-02-28 DIAGNOSIS — Z800 Family history of malignant neoplasm of digestive organs: Secondary | ICD-10-CM

## 2014-02-28 DIAGNOSIS — E119 Type 2 diabetes mellitus without complications: Secondary | ICD-10-CM | POA: Diagnosis present

## 2014-02-28 DIAGNOSIS — I739 Peripheral vascular disease, unspecified: Secondary | ICD-10-CM | POA: Diagnosis present

## 2014-02-28 DIAGNOSIS — Z833 Family history of diabetes mellitus: Secondary | ICD-10-CM

## 2014-02-28 DIAGNOSIS — N390 Urinary tract infection, site not specified: Secondary | ICD-10-CM | POA: Diagnosis present

## 2014-02-28 DIAGNOSIS — E039 Hypothyroidism, unspecified: Secondary | ICD-10-CM | POA: Diagnosis present

## 2014-02-28 DIAGNOSIS — E876 Hypokalemia: Secondary | ICD-10-CM | POA: Diagnosis present

## 2014-02-28 DIAGNOSIS — Z8673 Personal history of transient ischemic attack (TIA), and cerebral infarction without residual deficits: Secondary | ICD-10-CM

## 2014-02-28 DIAGNOSIS — I10 Essential (primary) hypertension: Secondary | ICD-10-CM | POA: Diagnosis present

## 2014-02-28 DIAGNOSIS — N179 Acute kidney failure, unspecified: Principal | ICD-10-CM | POA: Diagnosis present

## 2014-02-28 DIAGNOSIS — E860 Dehydration: Secondary | ICD-10-CM | POA: Diagnosis present

## 2014-02-28 DIAGNOSIS — K219 Gastro-esophageal reflux disease without esophagitis: Secondary | ICD-10-CM | POA: Diagnosis present

## 2014-02-28 DIAGNOSIS — I2510 Atherosclerotic heart disease of native coronary artery without angina pectoris: Secondary | ICD-10-CM | POA: Diagnosis present

## 2014-02-28 LAB — BASIC METABOLIC PANEL
BUN: 35 mg/dL — ABNORMAL HIGH (ref 6–23)
CALCIUM: 10 mg/dL (ref 8.4–10.5)
CO2: 21 meq/L (ref 19–32)
CREATININE: 1.77 mg/dL — AB (ref 0.50–1.10)
Chloride: 99 mEq/L (ref 96–112)
GFR calc Af Amer: 35 mL/min — ABNORMAL LOW (ref >90)
GFR calc non Af Amer: 30 mL/min — ABNORMAL LOW (ref >90)
Glucose, Bld: 173 mg/dL — ABNORMAL HIGH (ref 70–99)
Potassium: 3.6 mEq/L — ABNORMAL LOW (ref 3.7–5.3)
Sodium: 137 mEq/L (ref 137–147)

## 2014-02-28 LAB — CBC
HCT: 36.3 % (ref 36.0–46.0)
Hemoglobin: 12.1 g/dL (ref 12.0–15.0)
MCH: 28.1 pg (ref 26.0–34.0)
MCHC: 33.3 g/dL (ref 30.0–36.0)
MCV: 84.4 fL (ref 78.0–100.0)
PLATELETS: 231 10*3/uL (ref 150–400)
RBC: 4.3 MIL/uL (ref 3.87–5.11)
RDW: 15.9 % — AB (ref 11.5–15.5)
WBC: 7.8 10*3/uL (ref 4.0–10.5)

## 2014-02-28 NOTE — ED Notes (Signed)
Pt c/o feeling hoarse, weakness, and dizziness for about a week. Pt also c/o muscle spasms in right leg. Pt is A&Ox4, respirations equal and unlabored, skin warm and dry.

## 2014-03-01 ENCOUNTER — Encounter (HOSPITAL_COMMUNITY): Payer: Self-pay | Admitting: Surgery

## 2014-03-01 ENCOUNTER — Emergency Department (HOSPITAL_COMMUNITY): Payer: Medicare Other

## 2014-03-01 ENCOUNTER — Inpatient Hospital Stay (HOSPITAL_COMMUNITY)
Admission: EM | Admit: 2014-03-01 | Discharge: 2014-03-02 | DRG: 683 | Disposition: A | Discharge status: Home / Self Care | Payer: Medicare Other | Attending: Internal Medicine | Admitting: Internal Medicine

## 2014-03-01 DIAGNOSIS — E1165 Type 2 diabetes mellitus with hyperglycemia: Secondary | ICD-10-CM

## 2014-03-01 DIAGNOSIS — I10 Essential (primary) hypertension: Secondary | ICD-10-CM

## 2014-03-01 DIAGNOSIS — N179 Acute kidney failure, unspecified: Secondary | ICD-10-CM

## 2014-03-01 DIAGNOSIS — E860 Dehydration: Secondary | ICD-10-CM | POA: Diagnosis present

## 2014-03-01 DIAGNOSIS — E039 Hypothyroidism, unspecified: Secondary | ICD-10-CM

## 2014-03-01 DIAGNOSIS — F17200 Nicotine dependence, unspecified, uncomplicated: Secondary | ICD-10-CM

## 2014-03-01 DIAGNOSIS — N183 Chronic kidney disease, stage 3 unspecified: Secondary | ICD-10-CM | POA: Diagnosis present

## 2014-03-01 DIAGNOSIS — R269 Unspecified abnormalities of gait and mobility: Secondary | ICD-10-CM

## 2014-03-01 DIAGNOSIS — Z7901 Long term (current) use of anticoagulants: Secondary | ICD-10-CM

## 2014-03-01 DIAGNOSIS — E0590 Thyrotoxicosis, unspecified without thyrotoxic crisis or storm: Secondary | ICD-10-CM

## 2014-03-01 DIAGNOSIS — E118 Type 2 diabetes mellitus with unspecified complications: Secondary | ICD-10-CM

## 2014-03-01 DIAGNOSIS — R0989 Other specified symptoms and signs involving the circulatory and respiratory systems: Secondary | ICD-10-CM

## 2014-03-01 DIAGNOSIS — R7989 Other specified abnormal findings of blood chemistry: Secondary | ICD-10-CM

## 2014-03-01 DIAGNOSIS — N390 Urinary tract infection, site not specified: Secondary | ICD-10-CM | POA: Diagnosis present

## 2014-03-01 DIAGNOSIS — E119 Type 2 diabetes mellitus without complications: Secondary | ICD-10-CM | POA: Diagnosis present

## 2014-03-01 DIAGNOSIS — R0609 Other forms of dyspnea: Secondary | ICD-10-CM

## 2014-03-01 DIAGNOSIS — E876 Hypokalemia: Secondary | ICD-10-CM

## 2014-03-01 DIAGNOSIS — E1122 Type 2 diabetes mellitus with diabetic chronic kidney disease: Secondary | ICD-10-CM | POA: Diagnosis present

## 2014-03-01 DIAGNOSIS — I739 Peripheral vascular disease, unspecified: Secondary | ICD-10-CM

## 2014-03-01 DIAGNOSIS — IMO0002 Reserved for concepts with insufficient information to code with codable children: Secondary | ICD-10-CM

## 2014-03-01 DIAGNOSIS — N1832 Chronic kidney disease, stage 3b: Secondary | ICD-10-CM | POA: Diagnosis present

## 2014-03-01 DIAGNOSIS — R809 Proteinuria, unspecified: Secondary | ICD-10-CM

## 2014-03-01 DIAGNOSIS — I6789 Other cerebrovascular disease: Secondary | ICD-10-CM

## 2014-03-01 DIAGNOSIS — E7800 Pure hypercholesterolemia, unspecified: Secondary | ICD-10-CM

## 2014-03-01 DIAGNOSIS — R799 Abnormal finding of blood chemistry, unspecified: Secondary | ICD-10-CM

## 2014-03-01 LAB — CBC
HCT: 31.7 % — ABNORMAL LOW (ref 36.0–46.0)
Hemoglobin: 10.4 g/dL — ABNORMAL LOW (ref 12.0–15.0)
MCH: 28.1 pg (ref 26.0–34.0)
MCHC: 32.8 g/dL (ref 30.0–36.0)
MCV: 85.7 fL (ref 78.0–100.0)
PLATELETS: 205 10*3/uL (ref 150–400)
RBC: 3.7 MIL/uL — ABNORMAL LOW (ref 3.87–5.11)
RDW: 16.1 % — ABNORMAL HIGH (ref 11.5–15.5)
WBC: 5.5 10*3/uL (ref 4.0–10.5)

## 2014-03-01 LAB — URINALYSIS, ROUTINE W REFLEX MICROSCOPIC
Bilirubin Urine: NEGATIVE
Glucose, UA: 500 mg/dL — AB
HGB URINE DIPSTICK: NEGATIVE
Ketones, ur: NEGATIVE mg/dL
Nitrite: NEGATIVE
PROTEIN: NEGATIVE mg/dL
Specific Gravity, Urine: 1.022 (ref 1.005–1.030)
Urobilinogen, UA: 0.2 mg/dL (ref 0.0–1.0)
pH: 5 (ref 5.0–8.0)

## 2014-03-01 LAB — BASIC METABOLIC PANEL
BUN: 35 mg/dL — ABNORMAL HIGH (ref 6–23)
CALCIUM: 8.8 mg/dL (ref 8.4–10.5)
CO2: 19 meq/L (ref 19–32)
Chloride: 103 mEq/L (ref 96–112)
Creatinine, Ser: 1.49 mg/dL — ABNORMAL HIGH (ref 0.50–1.10)
GFR calc Af Amer: 43 mL/min — ABNORMAL LOW (ref >90)
GFR, EST NON AFRICAN AMERICAN: 37 mL/min — AB (ref >90)
Glucose, Bld: 272 mg/dL — ABNORMAL HIGH (ref 70–99)
POTASSIUM: 3.8 meq/L (ref 3.7–5.3)
SODIUM: 138 meq/L (ref 137–147)

## 2014-03-01 LAB — URINE MICROSCOPIC-ADD ON

## 2014-03-01 LAB — TSH: TSH: 0.017 u[IU]/mL — AB (ref 0.350–4.500)

## 2014-03-01 LAB — T3: T3, Total: 734.8 ng/dl — ABNORMAL HIGH (ref 80.0–204.0)

## 2014-03-01 LAB — T4, FREE: Free T4: 2.08 ng/dL — ABNORMAL HIGH (ref 0.80–1.80)

## 2014-03-01 LAB — HEMOGLOBIN A1C
Hgb A1c MFr Bld: 10.3 % — ABNORMAL HIGH (ref <5.7)
Mean Plasma Glucose: 249 mg/dL — ABNORMAL HIGH (ref <117)

## 2014-03-01 MED ORDER — ASPIRIN EC 81 MG PO TBEC
81.0000 mg | DELAYED_RELEASE_TABLET | Freq: Every day | ORAL | Status: DC
  Administered 2014-03-01 – 2014-03-02 (×2): 81 mg via ORAL
  Filled 2014-03-01 (×2): qty 1

## 2014-03-01 MED ORDER — ONDANSETRON HCL 4 MG/2ML IJ SOLN: 4.0000 mg | Freq: Four times a day (QID) | INTRAMUSCULAR | Status: DC | PRN

## 2014-03-01 MED ORDER — CEFUROXIME AXETIL 500 MG PO TABS
500.0000 mg | ORAL_TABLET | Freq: Two times a day (BID) | ORAL | Status: DC
  Administered 2014-03-02: 500 mg via ORAL
  Filled 2014-03-01 (×3): qty 1

## 2014-03-01 MED ORDER — HYDROMORPHONE HCL PF 1 MG/ML IJ SOLN: 1.0000 mg | INTRAMUSCULAR | Status: DC | PRN

## 2014-03-01 MED ORDER — ONDANSETRON HCL 4 MG PO TABS: 4.0000 mg | ORAL_TABLET | Freq: Four times a day (QID) | ORAL | Status: DC | PRN

## 2014-03-01 MED ORDER — CILOSTAZOL 100 MG PO TABS
100.0000 mg | ORAL_TABLET | Freq: Two times a day (BID) | ORAL | Status: DC | PRN
  Filled 2014-03-01: qty 1

## 2014-03-01 MED ORDER — ACETAMINOPHEN 325 MG PO TABS
650.0000 mg | ORAL_TABLET | Freq: Once | ORAL | Status: AC
Start: 2014-03-01 — End: 2014-03-01
  Administered 2014-03-01: 650 mg via ORAL
  Filled 2014-03-01: qty 2

## 2014-03-01 MED ORDER — CLOPIDOGREL BISULFATE 75 MG PO TABS
75.0000 mg | ORAL_TABLET | Freq: Every day | ORAL | Status: DC
  Administered 2014-03-01 – 2014-03-02 (×2): 75 mg via ORAL
  Filled 2014-03-01 (×3): qty 1

## 2014-03-01 MED ORDER — OMEGA-3-ACID ETHYL ESTERS 1 G PO CAPS
2.0000 g | ORAL_CAPSULE | Freq: Two times a day (BID) | ORAL | Status: DC
  Administered 2014-03-01 – 2014-03-02 (×3): 2 g via ORAL
  Filled 2014-03-01 (×4): qty 2

## 2014-03-01 MED ORDER — DOCUSATE SODIUM 100 MG PO CAPS
100.0000 mg | ORAL_CAPSULE | Freq: Two times a day (BID) | ORAL | Status: DC
  Administered 2014-03-01 – 2014-03-02 (×2): 100 mg via ORAL
  Filled 2014-03-01 (×2): qty 1

## 2014-03-01 MED ORDER — HYDROCODONE-ACETAMINOPHEN 5-325 MG PO TABS: 1.0000 | ORAL_TABLET | Freq: Four times a day (QID) | ORAL | Status: DC | PRN

## 2014-03-01 MED ORDER — DEXTROSE 5 % IV SOLN
1.0000 g | INTRAVENOUS | Status: DC
  Filled 2014-03-01: qty 10

## 2014-03-01 MED ORDER — SODIUM CHLORIDE 0.9 % IV SOLN
INTRAVENOUS | Status: DC
  Administered 2014-03-01: 125 mL/h via INTRAVENOUS

## 2014-03-01 MED ORDER — POTASSIUM CHLORIDE CRYS ER 20 MEQ PO TBCR
40.0000 meq | EXTENDED_RELEASE_TABLET | Freq: Once | ORAL | Status: AC
  Administered 2014-03-01: 40 meq via ORAL
  Filled 2014-03-01: qty 2

## 2014-03-01 MED ORDER — ENOXAPARIN SODIUM 40 MG/0.4ML ~~LOC~~ SOLN
40.0000 mg | SUBCUTANEOUS | Status: DC
  Administered 2014-03-01 – 2014-03-02 (×2): 40 mg via SUBCUTANEOUS
  Filled 2014-03-01 (×2): qty 0.4

## 2014-03-01 MED ORDER — SODIUM CHLORIDE 0.9 % IV SOLN
INTRAVENOUS | Status: DC
  Administered 2014-03-01: 06:00:00 via INTRAVENOUS

## 2014-03-01 MED ORDER — POTASSIUM CHLORIDE CRYS ER 20 MEQ PO TBCR: 40.0000 meq | EXTENDED_RELEASE_TABLET | Freq: Once | ORAL | Status: DC

## 2014-03-01 MED ORDER — ACETAMINOPHEN 325 MG PO TABS
650.0000 mg | ORAL_TABLET | Freq: Four times a day (QID) | ORAL | Status: DC | PRN
Start: 2014-03-01 — End: 2014-03-02
  Administered 2014-03-01 – 2014-03-02 (×2): 650 mg via ORAL
  Filled 2014-03-01 (×2): qty 2

## 2014-03-01 MED ORDER — SIMVASTATIN 20 MG PO TABS
20.0000 mg | ORAL_TABLET | Freq: Every day | ORAL | Status: DC
  Administered 2014-03-01: 20 mg via ORAL
  Filled 2014-03-01 (×2): qty 1

## 2014-03-01 MED ORDER — FAMOTIDINE 20 MG PO TABS
20.0000 mg | ORAL_TABLET | Freq: Every day | ORAL | Status: DC
Start: 2014-03-01 — End: 2014-03-02
  Administered 2014-03-01 – 2014-03-02 (×2): 20 mg via ORAL
  Filled 2014-03-01 (×2): qty 1

## 2014-03-01 MED ORDER — DEXTROSE 5 % IV SOLN
1.0000 g | Freq: Once | INTRAVENOUS | Status: AC
  Administered 2014-03-01: 1 g via INTRAVENOUS
  Filled 2014-03-01: qty 10

## 2014-03-01 MED ORDER — INSULIN ASPART PROT & ASPART (70-30 MIX) 100 UNIT/ML ~~LOC~~ SUSP
25.0000 [IU] | Freq: Two times a day (BID) | SUBCUTANEOUS | Status: DC
  Administered 2014-03-01: 25 [IU] via SUBCUTANEOUS
  Filled 2014-03-01: qty 10

## 2014-03-01 MED ORDER — ACETAMINOPHEN 650 MG RE SUPP: 650.0000 mg | Freq: Four times a day (QID) | RECTAL | Status: DC | PRN

## 2014-03-01 MED ORDER — SODIUM CHLORIDE 0.9 % IV BOLUS (SEPSIS)
1000.0000 mL | Freq: Once | INTRAVENOUS | Status: AC
  Administered 2014-03-01: 1000 mL via INTRAVENOUS

## 2014-03-01 MED ORDER — LEVOTHYROXINE SODIUM 88 MCG PO TABS
88.0000 ug | ORAL_TABLET | Freq: Every day | ORAL | Status: DC
  Administered 2014-03-01: 88 ug via ORAL
  Filled 2014-03-01 (×3): qty 1

## 2014-03-01 MED ORDER — ALUM & MAG HYDROXIDE-SIMETH 200-200-20 MG/5ML PO SUSP: 30.0000 mL | Freq: Four times a day (QID) | ORAL | Status: DC | PRN

## 2014-03-01 MED ORDER — INSULIN ASPART PROT & ASPART (70-30 MIX) 100 UNIT/ML ~~LOC~~ SUSP
30.0000 [IU] | Freq: Two times a day (BID) | SUBCUTANEOUS | Status: DC
  Administered 2014-03-01 – 2014-03-02 (×2): 30 [IU] via SUBCUTANEOUS

## 2014-03-01 MED ORDER — CARVEDILOL 6.25 MG PO TABS
6.2500 mg | ORAL_TABLET | Freq: Two times a day (BID) | ORAL | Status: DC
  Administered 2014-03-01 – 2014-03-02 (×3): 6.25 mg via ORAL
  Filled 2014-03-01 (×6): qty 1

## 2014-03-01 NOTE — ED Provider Notes (Signed)
CSN: [id number]     Arrival date & time 02/28/14  2157 History   First MD Initiated Contact with Patient 03/01/14 0148     Chief Complaint  Patient presents with  . Hoarse  . Fatigue     (Consider location/radiation/quality/duration/timing/severity/associated sxs/prior Treatment) HPI Hx per PT - followed by [facility], has DM and HTN - presents with ongoing bouts of breaking out into sweats, c/o hot flashes, has been evaluated by her PCP for this, states every time she eats she breaks out into a sweat. Blood sugars under good control. No cough or fever. Has h/o thyroid Dz also followed by PCP PT has RLS and nightly leg cramps. Also c/o hoarse voice is requesting CXR as she is concerned about possible PNA. No Fever or chills. No CP or SOB.   Past Medical History  Diagnosis Date  . CVA (cerebral vascular accident) 01/2011    affected rt side  . Diabetes mellitus   . Hypertension   . Arteriosclerotic cardiovascular disease   . Hypothyroidism   . Hypocholesterolemia   . Renal insufficiency   . Anemia   . Vitamin D deficiency   . Peripheral vascular disease   . GERD (gastroesophageal reflux disease)   . Claudication in peripheral vascular disease    Past Surgical History  Procedure Laterality Date  . Tubal ligation    . Cesarean section     Family History  Problem Relation Age of Onset  . Diabetes Mother   . Diabetes Father   . Diabetes Sister   . Diabetes Brother   . Colon cancer Brother   . Esophageal cancer Neg Hx   . Rectal cancer Neg Hx   . Stomach cancer Neg Hx    History  Substance Use Topics  . Smoking status: Former Smoker    Quit date: 02/04/2011  . Smokeless tobacco: Never Used  . Alcohol Use: No   OB History   Grav Para Term Preterm Abortions TAB SAB Ect Mult Living                 Review of Systems  Constitutional: Negative for fever and chills.  HENT: Positive for voice change.   Eyes: Negative for visual disturbance.  Respiratory:  Negative for shortness of breath.   Cardiovascular: Negative for chest pain.  Gastrointestinal: Negative for vomiting and abdominal pain.  Genitourinary: Negative for dysuria, flank pain and difficulty urinating.  Musculoskeletal: Negative for back pain, neck pain and neck stiffness.  Skin: Negative for rash.  Neurological: Negative for weakness and numbness.  All other systems reviewed and are negative.     Allergies  Review of patient's allergies indicates no known allergies.  Home Medications   Prior to Admission medications   Medication Sig Start Date End Date Taking? Authorizing Provider  aspirin EC 81 MG tablet Take 81 mg by mouth daily.   Yes Historical Provider, MD  carvedilol (COREG) 6.25 MG tablet Take 6.25 mg by mouth 2 (two) times daily with a meal.   Yes Historical Provider, MD  cilostazol (PLETAL) 100 MG tablet Take 100 mg by mouth 2 (two) times daily as needed (muscle spasms).   Yes Historical Provider, MD  clopidogrel (PLAVIX) 75 MG tablet Take 75 mg by mouth every morning.    Yes Historical Provider, MD  furosemide (LASIX) 40 MG tablet Take 40 mg by mouth daily as needed for fluid or edema.   Yes Historical Provider, MD  glimepiride (AMARYL) 2 MG tablet Take 2 mg  by mouth 2 (two) times daily.   Yes Historical Provider, MD  insulin aspart protamine- aspart (NOVOLOG 70/30) (70-30) 100 UNIT/ML injection Inject 25 Units into the skin 2 (two) times daily with a meal.    Yes Historical Provider, MD  levothyroxine (SYNTHROID, LEVOTHROID) 112 MCG tablet Take 112 mcg by mouth daily before breakfast.   Yes Historical Provider, MD  losartan (COZAAR) 25 MG tablet Take 25 mg by mouth daily.   Yes Historical Provider, MD  omega-3 acid ethyl esters (LOVAZA) 1 G capsule Take 2 g by mouth 2 (two) times daily.   Yes Historical Provider, MD  pravastatin (PRAVACHOL) 40 MG tablet Take 40 mg by mouth daily.   Yes Historical Provider, MD  ranitidine (ZANTAC) 150 MG tablet Take 150 mg by mouth  daily as needed for heartburn.   Yes Historical Provider, MD   BP 151/57  Pulse 108  Temp(Src) 98.7 F (37.1 C) (Oral)  Resp 18  Wt 164 lb (74.39 kg)  SpO2 92% Physical Exam  Constitutional: She is oriented to person, place, and time. She appears well-developed and well-nourished.  HENT:  Head: Normocephalic and atraumatic.  Mouth/Throat: Oropharynx is clear and moist. No oropharyngeal exudate.  Eyes: Conjunctivae and EOM are normal. Pupils are equal, round, and reactive to light. No scleral icterus.  Neck: Normal range of motion. Neck supple. No thyromegaly present.  Cardiovascular: Normal rate, regular rhythm and intact distal pulses.   Pulmonary/Chest: Effort normal and breath sounds normal. No respiratory distress.  Abdominal: Soft. She exhibits no distension. There is no tenderness. There is no rebound and no guarding.  Musculoskeletal: Normal range of motion. She exhibits no edema.  Neurological: She is alert and oriented to person, place, and time. No cranial nerve deficit.  Skin: Skin is warm and dry.    ED Course  Procedures (including critical care time) Labs Review Labs Reviewed  CBC - Abnormal; Notable for the following:    RDW 15.9 (*)    All other components within normal limits  BASIC METABOLIC PANEL - Abnormal; Notable for the following:    Potassium 3.6 (*)    Glucose, Bld 173 (*)    BUN 35 (*)    Creatinine, Ser 1.77 (*)    GFR calc non Af Amer 30 (*)    GFR calc Af Amer 35 (*)    All other components within normal limits  CBG MONITORING, ED    Imaging Review Dg Chest 2 View  03/01/2014   CLINICAL DATA:  Hoarse, fatigue, leg spasms, intermittent for 2 months. Left upper quadrant abdominal pain.  EXAM: CHEST  2 VIEW  COMPARISON:  DG CHEST 2 VIEW dated 04/02/2013  FINDINGS: The heart size and mediastinal contours are within normal limits. Both lungs are clear. The visualized skeletal structures are unremarkable.  IMPRESSION: No active cardiopulmonary  disease.   Electronically Signed   By: Lucienne Capers M.D.   On: 03/01/2014 03:10   IV fluids and potassium provided. IV antibiotics for UTI. Discussed with Dr. Tana Coast, plan admit OBS MDM   Final diagnoses:  Elevated serum creatinine  Hypokalemia   Presents with multitude of symptoms, found to be hyperthyroid with UTI. Evaluated with labs and imaging as above. Treated with IV fluids, potassium and antibiotics. MED admit.    Teressa Lower, MD 03/02/14 [phone]

## 2014-03-01 NOTE — Progress Notes (Signed)
Patient admitted after midnight.  Chart reviewed. Patient examined. Feels better. Change to po abx. Continue IVF. Home in am if stable.  Doree Barthel, M.D. [facility] [phone]

## 2014-03-01 NOTE — H&P (Signed)
History and Physical       Hospital Admission Note Date: 03/01/2014  Patient name: [name] Medical record number: [id number] Date of birth: [birth year] Age: 63 y.o. Gender: female  PCP: Berkley Harvey, NP    Chief Complaint:  Diaphoresis, sore throat, polyuria  HPI: Patient is a 63 year old female with history of diabetes, hypertension, hypothyroidism on Synthroid, peripheral vascular disease presented to the ER with generalized weakness and dizziness for about a week. Patient also reports that she's been having some muscle spasm in the right leg, breaking in sweats. She also reports polyuria and increased urgency and frequency. She denies any significant fevers or chills or any dysuria denies any chest pain. ER workup showed UA positive for UTI, creatinine 1.7, TSH 0.01  Review of Systems:  Constitutional: Denies fever, chills, diaphoresis, ++poor appetite and fatigue.  HEENT: Denies photophobia, eye pain, redness, hearing loss, ear pain, congestion, sore throat, rhinorrhea, sneezing, mouth sores, trouble swallowing, neck pain, neck stiffness and tinnitus.   Respiratory: Denies SOB, DOE, cough, chest tightness,  and wheezing.   Cardiovascular: Denies chest pain, palpitations and leg swelling.  Gastrointestinal: Denies nausea, vomiting, abdominal pain, diarrhea, constipation, blood in stool and abdominal distention.  Genitourinary: Denies dysuria, urgency, frequency, hematuria, flank pain and difficulty urinating.  Musculoskeletal: Denies myalgias, back pain, joint swelling, arthralgias and gait problem.  Skin: Denies pallor, rash and wound.  Neurological: Please see history of present illness Hematological: Denies adenopathy. Easy bruising, personal or family bleeding history  Psychiatric/Behavioral: Denies suicidal ideation, mood changes, confusion, nervousness, sleep disturbance and agitation  Past Medical History: Past  Medical History  Diagnosis Date  . CVA (cerebral vascular accident) 01/2011    affected rt side  . Diabetes mellitus   . Hypertension   . Arteriosclerotic cardiovascular disease   . Hypothyroidism   . Hypocholesterolemia   . Renal insufficiency   . Anemia   . Vitamin D deficiency   . Peripheral vascular disease   . GERD (gastroesophageal reflux disease)   . Claudication in peripheral vascular disease    Past Surgical History  Procedure Laterality Date  . Tubal ligation    . Cesarean section      Medications: Prior to Admission medications   Medication Sig Start Date End Date Taking? Authorizing Provider  aspirin EC 81 MG tablet Take 81 mg by mouth daily.   Yes Historical Provider, MD  carvedilol (COREG) 6.25 MG tablet Take 6.25 mg by mouth 2 (two) times daily with a meal.   Yes Historical Provider, MD  cilostazol (PLETAL) 100 MG tablet Take 100 mg by mouth 2 (two) times daily as needed (muscle spasms).   Yes Historical Provider, MD  clopidogrel (PLAVIX) 75 MG tablet Take 75 mg by mouth every morning.    Yes Historical Provider, MD  furosemide (LASIX) 40 MG tablet Take 40 mg by mouth daily as needed for fluid or edema.   Yes Historical Provider, MD  glimepiride (AMARYL) 2 MG tablet Take 2 mg by mouth 2 (two) times daily.   Yes Historical Provider, MD  insulin aspart protamine- aspart (NOVOLOG 70/30) (70-30) 100 UNIT/ML injection Inject 25 Units into the skin 2 (two) times daily with a meal.    Yes Historical Provider, MD  levothyroxine (SYNTHROID, LEVOTHROID) 112 MCG tablet Take 112 mcg by mouth daily before breakfast.   Yes Historical Provider, MD  losartan (COZAAR) 25 MG tablet Take 25 mg by mouth daily.   Yes Historical Provider, MD  omega-3 acid ethyl esters (  LOVAZA) 1 G capsule Take 2 g by mouth 2 (two) times daily.   Yes Historical Provider, MD  pravastatin (PRAVACHOL) 40 MG tablet Take 40 mg by mouth daily.   Yes Historical Provider, MD  ranitidine (ZANTAC) 150 MG tablet Take  150 mg by mouth daily as needed for heartburn.   Yes Historical Provider, MD    Allergies:  No Known Allergies  Social History:  reports that she quit smoking about 3 years ago. She has never used smokeless tobacco. She reports that she does not drink alcohol or use illicit drugs.  Family History: Family History  Problem Relation Age of Onset  . Diabetes Mother   . Diabetes Father   . Diabetes Sister   . Diabetes Brother   . Colon cancer Brother   . Esophageal cancer Neg Hx   . Rectal cancer Neg Hx   . Stomach cancer Neg Hx     Physical Exam: Blood pressure 122/46, pulse 92, temperature 97.6 F (36.4 C), temperature source Oral, resp. rate 16, weight 74.39 kg (164 lb), SpO2 95.00%. General: Alert, awake, oriented x3, in no acute distress. HEENT: normocephalic, atraumatic, anicteric sclera, pink conjunctiva, pupils equal and reactive to light and accomodation, oropharynx clear Neck: supple, no masses or lymphadenopathy, no goiter, no bruits  Heart: Regular rate and rhythm, without murmurs, rubs or gallops. Lungs: Clear to auscultation bilaterally, no wheezing, rales or rhonchi. Abdomen: Soft, nontender, nondistended, positive bowel sounds, no masses. Extremities: No clubbing, cyanosis or edema with positive pedal pulses. Neuro: Grossly intact, no focal neurological deficits, strength 5/5 upper and lower extremities bilaterally Psych: alert and oriented x 3, normal mood and affect Skin: no rashes or lesions, warm and dry   LABS on Admission:  Basic Metabolic Panel:  Recent Labs Lab 02/28/14 2221  NA 137  K 3.6*  CL 99  CO2 21  GLUCOSE 173*  BUN 35*  CREATININE 1.77*  CALCIUM 10.0   Liver Function Tests: No results found for this basename: AST, ALT, ALKPHOS, BILITOT, PROT, ALBUMIN,  in the last 168 hours No results found for this basename: LIPASE, AMYLASE,  in the last 168 hours No results found for this basename: AMMONIA,  in the last 168 hours CBC:  Recent  Labs Lab 02/28/14 2221  WBC 7.8  HGB 12.1  HCT 36.3  MCV 84.4  PLT 231   Cardiac Enzymes: No results found for this basename: CKTOTAL, CKMB, CKMBINDEX, TROPONINI,  in the last 168 hours BNP: No components found with this basename: POCBNP,  CBG: No results found for this basename: GLUCAP,  in the last 168 hours   Radiological Exams on Admission: Dg Chest 2 View  03/01/2014   CLINICAL DATA:  Hoarse, fatigue, leg spasms, intermittent for 2 months. Left upper quadrant abdominal pain.  EXAM: CHEST  2 VIEW  COMPARISON:  DG CHEST 2 VIEW dated 04/02/2013  FINDINGS: The heart size and mediastinal contours are within normal limits. Both lungs are clear. The visualized skeletal structures are unremarkable.  IMPRESSION: No active cardiopulmonary disease.   Electronically Signed   By: Lucienne Capers M.D.   On: 03/01/2014 03:10    Assessment/Plan Principal Problem:   Acute kidney injury likely due to dehydration, losartan, Lasix, UTI: -   we'll continue aggressive IV fluid hydration, baseline creatinine 1.2, obtain urine culture and sensitivity, continue IV Rocephin  - Hold losartan, Lasix  Active Problems:   DIABETES MELLITUS, II, COMPLICATIONS - Continue insulin, 70/30 25 units twice a day  Hyperthyroidism: Patient actually has history of hypothyroidism on Synthroid however TSH is 0.017 - Possibly patient is feeling some symptoms of iatrogenic hyperthyroidism, with feeling of flushing and palpitations and sweating. Will continue IV fluid hydration and decrease Synthroid dose to 88 MCG daily    Dehydration - Continue IV fluid hydration    UTI (urinary tract infection) - Placed on IV Rocephin, follow urine cultures   DVT prophylaxis: Lovenox   CODE STATUS: full code   Family Communication: Admission, patients condition and plan of care including tests being ordered have been discussed with the patient who indicates understanding and agree with the plan and Code Status   Further  plan will depend as patient's clinical course evolves and further radiologic and laboratory data become available.   Time Spent on Admission: 1 hour   [place] Krystal Eaton M.D. [facility] 03/01/2014, 4:40 AM Pager: [phone]  If 7PM-7AM, please contact night-coverage [email or web address] Password [email or web address]  **Disclaimer: This note was dictated with voice recognition software. Similar sounding words can inadvertently be transcribed and this note may contain transcription errors which may not have been corrected upon publication of note.**

## 2014-03-02 DIAGNOSIS — E876 Hypokalemia: Secondary | ICD-10-CM

## 2014-03-02 DIAGNOSIS — E1165 Type 2 diabetes mellitus with hyperglycemia: Secondary | ICD-10-CM

## 2014-03-02 DIAGNOSIS — E118 Type 2 diabetes mellitus with unspecified complications: Secondary | ICD-10-CM

## 2014-03-02 DIAGNOSIS — IMO0002 Reserved for concepts with insufficient information to code with codable children: Secondary | ICD-10-CM

## 2014-03-02 DIAGNOSIS — N390 Urinary tract infection, site not specified: Secondary | ICD-10-CM

## 2014-03-02 LAB — CK: Total CK: 182 U/L — ABNORMAL HIGH (ref 7–177)

## 2014-03-02 LAB — CBC
HCT: 30.1 % — ABNORMAL LOW (ref 36.0–46.0)
HEMOGLOBIN: 9.8 g/dL — AB (ref 12.0–15.0)
MCH: 28.2 pg (ref 26.0–34.0)
MCHC: 32.6 g/dL (ref 30.0–36.0)
MCV: 86.5 fL (ref 78.0–100.0)
Platelets: 186 10*3/uL (ref 150–400)
RBC: 3.48 MIL/uL — ABNORMAL LOW (ref 3.87–5.11)
RDW: 16.1 % — ABNORMAL HIGH (ref 11.5–15.5)
WBC: 5.5 10*3/uL (ref 4.0–10.5)

## 2014-03-02 LAB — BASIC METABOLIC PANEL
BUN: 25 mg/dL — AB (ref 6–23)
CO2: 21 meq/L (ref 19–32)
Calcium: 9.5 mg/dL (ref 8.4–10.5)
Chloride: 107 mEq/L (ref 96–112)
Creatinine, Ser: 1.17 mg/dL — ABNORMAL HIGH (ref 0.50–1.10)
GFR calc non Af Amer: 49 mL/min — ABNORMAL LOW (ref >90)
GFR, EST AFRICAN AMERICAN: 57 mL/min — AB (ref >90)
Glucose, Bld: 117 mg/dL — ABNORMAL HIGH (ref 70–99)
POTASSIUM: 3.8 meq/L (ref 3.7–5.3)
Sodium: 140 mEq/L (ref 137–147)

## 2014-03-02 LAB — GLUCOSE, CAPILLARY: GLUCOSE-CAPILLARY: 173 mg/dL — AB (ref 70–99)

## 2014-03-02 MED ORDER — INSULIN ASPART PROT & ASPART (70-30 MIX) 100 UNIT/ML ~~LOC~~ SUSP: 30.0000 [IU] | Freq: Two times a day (BID) | SUBCUTANEOUS | Status: DC

## 2014-03-02 MED ORDER — LEVOTHYROXINE SODIUM 88 MCG PO TABS: 88.0000 ug | ORAL_TABLET | Freq: Every day | ORAL | Status: DC

## 2014-03-02 MED ORDER — CEFUROXIME AXETIL 500 MG PO TABS: 500.0000 mg | ORAL_TABLET | Freq: Two times a day (BID) | ORAL | Status: DC

## 2014-03-02 NOTE — Discharge Summary (Signed)
Physician Discharge Summary  [name] XLK:[id number] DOB: 1951/05/22 DOA: 03/01/2014  PCP: [name] Harvey, NP  Admit date: 03/01/2014 Discharge date: 03/02/2014  Time spent: greater than 30 minutes  Recommendations for Outpatient Follow-up:  1. Check TSH 2-3 months  Discharge Diagnoses:  Principal Problem:   Acute kidney injury Active Problems:   DIABETES MELLITUS, II, COMPLICATIONS   Hyperthyroidism, iatrogenic   Dehydration   UTI (urinary tract infection)   Discharge Condition: stable  Filed Weights   02/28/14 2212 03/01/14 0539  Weight: 74.39 kg (164 lb) 76.749 kg (169 lb 3.2 oz)    History of present illness:  63 year old female with history of diabetes, hypertension, hypothyroidism on Synthroid, peripheral vascular disease presented to the ER with generalized weakness and dizziness for about a week. Patient also reports that she's been having some muscle spasm in the right leg, breaking in sweats. She also reports polyuria and increased urgency and frequency. She denies any significant fevers or chills or any dysuria denies any chest pain.  ER workup showed UA positive for UTI, creatinine 1.7, TSH 0.01  Hospital Course:  Admitted to [facility] unit. Started on rocephin, IVF.  Free T4 high, total T3 high. Synthroid dose decreased to 88 mcg per day. Renal function improved. Vitals remained stable.  Patient feels better and requesting discharge.  She is looking into finding a new PCP.  Patient reports she does not take pravastatin, so discontinued from admission med list  Procedures: none Consultations: none  Discharge Exam: Filed Vitals:   03/02/14 0554  BP: 147/63  Pulse: 90  Temp: 99.1 F (37.3 C)  Resp: 16    General: alert, oriented. Cardiovascular: RRR Respiratory: CTA Ext no CCE   Discharge Orders   Future Orders Complete By Expires   Activity as tolerated - No restrictions  As directed    Diet Carb Modified  As directed        Medication List     STOP taking these medications       glimepiride 2 MG tablet  Commonly known as:  AMARYL     pravastatin 40 MG tablet  Commonly known as:  PRAVACHOL      TAKE these medications       aspirin EC 81 MG tablet  Take 81 mg by mouth daily.     carvedilol 6.25 MG tablet  Commonly known as:  COREG  Take 6.25 mg by mouth 2 (two) times daily with a meal.     cefUROXime 500 MG tablet  Commonly known as:  CEFTIN  Take 1 tablet (500 mg total) by mouth 2 (two) times daily with a meal.     cilostazol 100 MG tablet  Commonly known as:  PLETAL  Take 100 mg by mouth 2 (two) times daily as needed (muscle spasms).     clopidogrel 75 MG tablet  Commonly known as:  PLAVIX  Take 75 mg by mouth every morning.     furosemide 40 MG tablet  Commonly known as:  LASIX  Take 40 mg by mouth daily as needed for fluid or edema.     insulin aspart protamine- aspart (70-30) 100 UNIT/ML injection  Commonly known as:  NOVOLOG MIX 70/30  Inject 0.3 mLs (30 Units total) into the skin 2 (two) times daily with a meal.     levothyroxine 88 MCG tablet  Commonly known as:  SYNTHROID  Take 1 tablet (88 mcg total) by mouth daily before breakfast.     losartan 25 MG  tablet  Commonly known as:  COZAAR  Take 25 mg by mouth daily.     omega-3 acid ethyl esters 1 G capsule  Commonly known as:  LOVAZA  Take 2 g by mouth 2 (two) times daily.     ranitidine 150 MG tablet  Commonly known as:  ZANTAC  Take 150 mg by mouth daily as needed for heartburn.       No Known Allergies     Follow-up Information   Follow up with [name] Abrahams L, NP In 4 weeks. (or primary care provider of choice)    Specialty:  Nurse Practitioner   Contact information:   [address] [place] [address] [phone]        The results of significant diagnostics from this hospitalization (including imaging, microbiology, ancillary and laboratory) are listed below for reference.    Significant Diagnostic  Studies: Dg Chest 2 View  03/01/2014   CLINICAL DATA:  Hoarse, fatigue, leg spasms, intermittent for 2 months. Left upper quadrant abdominal pain.  EXAM: CHEST  2 VIEW  COMPARISON:  DG CHEST 2 VIEW dated 04/02/2013  FINDINGS: The heart size and mediastinal contours are within normal limits. Both lungs are clear. The visualized skeletal structures are unremarkable.  IMPRESSION: No active cardiopulmonary disease.   Electronically Signed   By: Lucienne Capers M.D.   On: 03/01/2014 03:10    Microbiology: Recent Results (from the past 240 hour(s))  URINE CULTURE     Status: None   Collection Time    03/01/14  3:27 AM      Result Value Ref Range Status   Specimen Description URINE, CLEAN CATCH   Final   Special Requests ADDED [phone]   Final   Culture  Setup Time     Final   Value: 03/01/2014 12:49     Performed at [facility]     Final   Value: >=100,000 COLONIES/ML     Performed at [facility]   Culture     Final   Value: ESCHERICHIA COLI     Performed at [facility]   Report Status PENDING   Incomplete     Labs: Basic Metabolic Panel:  Recent Labs Lab 02/28/14 2221 03/01/14 0800 03/02/14 0350  NA 137 138 140  K 3.6* 3.8 3.8  CL 99 103 107  CO2 21 19 21   GLUCOSE 173* 272* 117*  BUN 35* 35* 25*  CREATININE 1.77* 1.49* 1.17*  CALCIUM 10.0 8.8 9.5   Liver Function Tests: No results found for this basename: AST, ALT, ALKPHOS, BILITOT, PROT, ALBUMIN,  in the last 168 hours No results found for this basename: LIPASE, AMYLASE,  in the last 168 hours No results found for this basename: AMMONIA,  in the last 168 hours CBC:  Recent Labs Lab 02/28/14 2221 03/01/14 0800 03/02/14 0350  WBC 7.8 5.5 5.5  HGB 12.1 10.4* 9.8*  HCT 36.3 31.7* 30.1*  MCV 84.4 85.7 86.5  PLT 231 205 186   Cardiac Enzymes: No results found for this basename: CKTOTAL, CKMB, CKMBINDEX, TROPONINI,  in the last 168 hours BNP: BNP (last 3 results)  Recent  Labs  04/02/13 0525  PROBNP 7.7   CBG: No results found for this basename: GLUCAP,  in the last 168 hours  Results for [name], [name] (MRN [id number]) as of 03/09/2014 09:58  Ref. Range 03/01/2014 02:07  TSH Latest Range: 0.350-4.500 uIU/mL 0.017 ([name])  Free T4 Latest Range: 0.80-1.80  ng/dL 2.08 (H)  T3, Free Latest Range: 2.3-4.2 pg/mL   T3, Total Latest Range: 80.0-204.0 ng/dl 734.8 (H)     Signed:  Delfina Redwood  [facility] 03/02/2014, 10:32 AM

## 2014-03-02 NOTE — Progress Notes (Signed)
Inpatient Diabetes Program Recommendations  AACE/ADA: New Consensus Statement on Inpatient Glycemic Control (2013)  Target Ranges:  Prepandial:   less than 140 mg/dL      Peak postprandial:   less than 180 mg/dL (1-2 hours)      Critically ill patients:  140 - 180 mg/dL   Pt has no cbg's ordered (probably due to no correction ordered for before meals nor at bedtime. Spoke with RN who states pt will probably go home today. However, pt is ordered 70/30 bid and given 30 units this am. Requested pt get cbg on pt, to call MD if necessary. Thank you, Rosita Kea, RN, CNS, Diabetes Coordinator [phone])

## 2014-03-02 NOTE — Progress Notes (Signed)
Discharged instructions reviewed with patient, including next dose time for meds. Patient verbalizes understanding. Printed AVS and prescriptions given to patient. Patient discharged to home. Will drive self.

## 2014-03-03 LAB — URINE CULTURE: Colony Count: >=100000

## 2014-03-03 LAB — GLUCOSE, CAPILLARY: GLUCOSE-CAPILLARY: 147 mg/dL — AB (ref 70–99)

## 2014-06-08 ENCOUNTER — Other Ambulatory Visit: Payer: Self-pay

## 2014-06-08 DIAGNOSIS — Z1231 Encounter for screening mammogram for malignant neoplasm of breast: Secondary | ICD-10-CM

## 2014-06-26 ENCOUNTER — Ambulatory Visit: Payer: Medicare Other

## 2014-09-04 ENCOUNTER — Emergency Department (HOSPITAL_COMMUNITY): Payer: Medicare Other

## 2014-09-04 ENCOUNTER — Encounter (HOSPITAL_COMMUNITY): Payer: Self-pay | Admitting: Emergency Medicine

## 2014-09-04 ENCOUNTER — Emergency Department (HOSPITAL_COMMUNITY)
Admission: EM | Admit: 2014-09-04 | Discharge: 2014-09-04 | Disposition: A | Discharge status: Home / Self Care | Payer: Medicare Other | Attending: Emergency Medicine | Admitting: Emergency Medicine

## 2014-09-04 DIAGNOSIS — I10 Essential (primary) hypertension: Secondary | ICD-10-CM | POA: Insufficient documentation

## 2014-09-04 DIAGNOSIS — Z8679 Personal history of other diseases of the circulatory system: Secondary | ICD-10-CM | POA: Insufficient documentation

## 2014-09-04 DIAGNOSIS — Z7982 Long term (current) use of aspirin: Secondary | ICD-10-CM | POA: Diagnosis not present

## 2014-09-04 DIAGNOSIS — R0789 Other chest pain: Secondary | ICD-10-CM | POA: Diagnosis not present

## 2014-09-04 DIAGNOSIS — Z794 Long term (current) use of insulin: Secondary | ICD-10-CM | POA: Diagnosis not present

## 2014-09-04 DIAGNOSIS — I2510 Atherosclerotic heart disease of native coronary artery without angina pectoris: Secondary | ICD-10-CM | POA: Diagnosis not present

## 2014-09-04 DIAGNOSIS — Z7902 Long term (current) use of antithrombotics/antiplatelets: Secondary | ICD-10-CM | POA: Diagnosis not present

## 2014-09-04 DIAGNOSIS — Z862 Personal history of diseases of the blood and blood-forming organs and certain disorders involving the immune mechanism: Secondary | ICD-10-CM | POA: Diagnosis not present

## 2014-09-04 DIAGNOSIS — R002 Palpitations: Secondary | ICD-10-CM | POA: Diagnosis present

## 2014-09-04 DIAGNOSIS — R51 Headache: Secondary | ICD-10-CM | POA: Diagnosis not present

## 2014-09-04 DIAGNOSIS — E786 Lipoprotein deficiency: Secondary | ICD-10-CM | POA: Insufficient documentation

## 2014-09-04 DIAGNOSIS — Z87891 Personal history of nicotine dependence: Secondary | ICD-10-CM | POA: Diagnosis not present

## 2014-09-04 DIAGNOSIS — E039 Hypothyroidism, unspecified: Secondary | ICD-10-CM | POA: Insufficient documentation

## 2014-09-04 DIAGNOSIS — Z79899 Other long term (current) drug therapy: Secondary | ICD-10-CM | POA: Insufficient documentation

## 2014-09-04 DIAGNOSIS — R5383 Other fatigue: Secondary | ICD-10-CM | POA: Insufficient documentation

## 2014-09-04 DIAGNOSIS — E1165 Type 2 diabetes mellitus with hyperglycemia: Secondary | ICD-10-CM | POA: Diagnosis not present

## 2014-09-04 DIAGNOSIS — R079 Chest pain, unspecified: Secondary | ICD-10-CM

## 2014-09-04 DIAGNOSIS — Z8719 Personal history of other diseases of the digestive system: Secondary | ICD-10-CM | POA: Diagnosis not present

## 2014-09-04 DIAGNOSIS — R519 Headache, unspecified: Secondary | ICD-10-CM

## 2014-09-04 LAB — CBC
HCT: 37.5 % (ref 36.0–46.0)
HEMOGLOBIN: 12.6 g/dL (ref 12.0–15.0)
MCH: 28.5 pg (ref 26.0–34.0)
MCHC: 33.6 g/dL (ref 30.0–36.0)
MCV: 84.8 fL (ref 78.0–100.0)
PLATELETS: 230 10*3/uL (ref 150–400)
RBC: 4.42 MIL/uL (ref 3.87–5.11)
RDW: 14.9 % (ref 11.5–15.5)
WBC: 5.6 10*3/uL (ref 4.0–10.5)

## 2014-09-04 LAB — BASIC METABOLIC PANEL
ANION GAP: 16 — AB (ref 5–15)
BUN: 24 mg/dL — ABNORMAL HIGH (ref 6–23)
CHLORIDE: 98 meq/L (ref 96–112)
CO2: 24 meq/L (ref 19–32)
Calcium: 9.8 mg/dL (ref 8.4–10.5)
Creatinine, Ser: 1.34 mg/dL — ABNORMAL HIGH (ref 0.50–1.10)
GFR calc Af Amer: 48 mL/min — ABNORMAL LOW (ref >90)
GFR calc non Af Amer: 41 mL/min — ABNORMAL LOW (ref >90)
GLUCOSE: 298 mg/dL — AB (ref 70–99)
Potassium: 3.9 mEq/L (ref 3.7–5.3)
SODIUM: 138 meq/L (ref 137–147)

## 2014-09-04 LAB — CBG MONITORING, ED: Glucose-Capillary: 294 mg/dL — ABNORMAL HIGH (ref 70–99)

## 2014-09-04 LAB — TROPONIN I: Troponin I: <0.3 ng/mL (ref <0.30)

## 2014-09-04 LAB — I-STAT TROPONIN, ED: TROPONIN I, POC: 0.01 ng/mL (ref 0.00–0.08)

## 2014-09-04 MED ORDER — ASPIRIN 325 MG PO TABS
325.0000 mg | ORAL_TABLET | ORAL | Status: AC
  Administered 2014-09-04: 325 mg via ORAL
  Filled 2014-09-04: qty 1

## 2014-09-04 MED ORDER — METOCLOPRAMIDE HCL 5 MG/ML IJ SOLN
10.0000 mg | Freq: Once | INTRAMUSCULAR | Status: AC
  Administered 2014-09-04: 10 mg via INTRAVENOUS
  Filled 2014-09-04: qty 2

## 2014-09-04 MED ORDER — NITROGLYCERIN 0.4 MG SL SUBL
0.4000 mg | SUBLINGUAL_TABLET | SUBLINGUAL | Status: DC | PRN
  Administered 2014-09-04 (×2): 0.4 mg via SUBLINGUAL
  Filled 2014-09-04: qty 1

## 2014-09-04 MED ORDER — KETOROLAC TROMETHAMINE 30 MG/ML IJ SOLN
15.0000 mg | Freq: Once | INTRAMUSCULAR | Status: AC
  Administered 2014-09-04: 15 mg via INTRAVENOUS
  Filled 2014-09-04: qty 1

## 2014-09-04 MED ORDER — ACETAMINOPHEN 325 MG PO TABS
650.0000 mg | ORAL_TABLET | Freq: Once | ORAL | Status: AC
  Administered 2014-09-04: 650 mg via ORAL
  Filled 2014-09-04: qty 2

## 2014-09-04 NOTE — ED Provider Notes (Signed)
CSN: [id number]     Arrival date & time 09/04/14  1707 History   First MD Initiated Contact with Patient 09/04/14 1840     Chief Complaint  Patient presents with  . Palpitations  . Hyperglycemia     (Consider location/radiation/quality/duration/timing/severity/associated sxs/prior Treatment) HPI Comments: 63 year old female with history of diabetes, hypertension, hypothyroidism on Synthroid, peripheral vascular disease, CVA x 2 presented to the ER with feeling weak and with palpitations and chest pain. Chest pain is described as tightness, mid sternal, intermittent, non radiating. Pt has been having pain pain for few days, pain has no provoking factor, and is not pleuritic or exertional. Pain is worse with supine position, and better when she is walking around. There is no orthopnea, PND.  Headaches are new. L sided, throbbing, started at 3 pm. Pt's headaches are moderately severe. No nausea, vomiting, visual complains, seizures, altered mental status, loss of consciousness, new weakness, or numbness, no gait instability. Weakness is described as generalized weakness.    Patient is a 63 y.o. female presenting with palpitations and hyperglycemia. The history is provided by the patient.  Palpitations Associated symptoms: chest pain   Associated symptoms: no cough, no dizziness, no nausea, no numbness, no shortness of breath and no vomiting   Hyperglycemia Associated symptoms: chest pain and fatigue   Associated symptoms: no abdominal pain, no confusion, no dizziness, no nausea, no shortness of breath and no vomiting     Past Medical History  Diagnosis Date  . CVA (cerebral vascular accident) 01/2011    affected rt side  . Diabetes mellitus   . Hypertension   . Arteriosclerotic cardiovascular disease   . Hypothyroidism   . Hypocholesterolemia   . Renal insufficiency   . Anemia   . Vitamin D deficiency   . Peripheral vascular disease   . GERD (gastroesophageal reflux disease)   .  Claudication in peripheral vascular disease    Past Surgical History  Procedure Laterality Date  . Tubal ligation    . Cesarean section     Family History  Problem Relation Age of Onset  . Diabetes Mother   . Diabetes Father   . Diabetes Sister   . Diabetes Brother   . Colon cancer Brother   . Esophageal cancer Neg Hx   . Rectal cancer Neg Hx   . Stomach cancer Neg Hx    History  Substance Use Topics  . Smoking status: Former Smoker    Quit date: 02/04/2011  . Smokeless tobacco: Never Used  . Alcohol Use: No   OB History   Grav Para Term Preterm Abortions TAB SAB Ect Mult Living                 Review of Systems  Constitutional: Positive for fatigue. Negative for activity change.  HENT: Negative for facial swelling.   Respiratory: Positive for chest tightness. Negative for cough, shortness of breath and wheezing.   Cardiovascular: Positive for chest pain and palpitations.  Gastrointestinal: Negative for nausea, vomiting, abdominal pain, diarrhea, constipation, blood in stool and abdominal distention.  Genitourinary: Negative for hematuria and difficulty urinating.  Musculoskeletal: Negative for neck pain.  Skin: Negative for color change.  Allergic/Immunologic: Negative for immunocompromised state.  Neurological: Positive for weakness and headaches. Negative for dizziness, speech difficulty, light-headedness and numbness.  Hematological: Does not bruise/bleed easily.  Psychiatric/Behavioral: Negative for confusion.      Allergies  Review of patient's allergies indicates no known allergies.  Home Medications   Prior to Admission  medications   Medication Sig Start Date End Date Taking? Authorizing Provider  aspirin EC 81 MG tablet Take 81 mg by mouth daily.   Yes Historical Provider, MD  cilostazol (PLETAL) 100 MG tablet Take 100 mg by mouth 2 (two) times daily as needed (muscle spasms).   Yes Historical Provider, MD  clopidogrel (PLAVIX) 75 MG tablet Take 75 mg  by mouth every morning.    Yes Historical Provider, MD  furosemide (LASIX) 40 MG tablet Take 40 mg by mouth daily.    Yes Historical Provider, MD  insulin aspart protamine- aspart (NOVOLOG MIX 70/30) (70-30) 100 UNIT/ML injection Inject 0.3 mLs (30 Units total) into the skin 2 (two) times daily with a meal. 03/02/14  Yes Delfina Redwood, MD  levothyroxine (SYNTHROID) 88 MCG tablet Take 1 tablet (88 mcg total) by mouth daily before breakfast. 03/02/14  Yes Delfina Redwood, MD  losartan (COZAAR) 25 MG tablet Take 50 mg by mouth daily.    Yes Historical Provider, MD  omega-3 acid ethyl esters (LOVAZA) 1 G capsule Take 2 g by mouth daily.    Yes Historical Provider, MD   BP 132/59  Pulse 80  Temp(Src) 97.6 F (36.4 C) (Oral)  Resp 18  Wt 180 lb 2 oz (81.704 kg)  SpO2 95% Physical Exam  Nursing note and vitals reviewed. Constitutional: She is oriented to person, place, and time. She appears well-developed and well-nourished.  HENT:  Head: Normocephalic and atraumatic.  Eyes: EOM are normal. Pupils are equal, round, and reactive to light.  Neck: Neck supple. No JVD present.  Cardiovascular: Normal rate, regular rhythm and normal heart sounds.   Pulmonary/Chest: Effort normal. No respiratory distress. She exhibits no tenderness.  Abdominal: Soft. She exhibits no distension. There is no tenderness. There is no rebound and no guarding.  Musculoskeletal: She exhibits no edema and no tenderness.  Neurological: She is alert and oriented to person, place, and time.  Skin: Skin is warm and dry.    ED Course  Procedures (including critical care time) Labs Review Labs Reviewed  BASIC METABOLIC PANEL - Abnormal; Notable for the following:    Glucose, Bld 298 (*)    BUN 24 (*)    Creatinine, Ser 1.34 (*)    GFR calc non Af Amer 41 (*)    GFR calc Af Amer 48 (*)    Anion gap 16 (*)    All other components within normal limits  CBG MONITORING, ED - Abnormal; Notable for the following:     Glucose-Capillary 294 (*)    All other components within normal limits  CBC  TROPONIN I  I-STAT TROPOININ, ED  CBG MONITORING, ED    Imaging Review Dg Chest 2 View  09/04/2014   CLINICAL DATA:  63 year old female with history of chest pain for 1 week.  EXAM: CHEST  2 VIEW  COMPARISON:  Chest x-ray 03/01/2014.  FINDINGS: Lung volumes are normal. No consolidative airspace disease. No pleural effusions. No pneumothorax. No pulmonary nodule or mass noted. Pulmonary vasculature and the cardiomediastinal silhouette are within normal limits. Atherosclerosis in the thoracic aorta  IMPRESSION: 1. No radiographic evidence of acute cardiopulmonary disease. 2. Atherosclerosis.   Electronically Signed   By: Vinnie Langton M.D.   On: 09/04/2014 18:41   Ct Head Wo Contrast  09/04/2014   CLINICAL DATA:  Right-sided headache x1 week  EXAM: CT HEAD WITHOUT CONTRAST  TECHNIQUE: Contiguous axial images were obtained from the base of the skull through the vertex  without intravenous contrast.  COMPARISON:  02/25/2011  FINDINGS: No evidence of parenchymal hemorrhage or extra-axial fluid collection. No mass lesion, mass effect, or midline shift.  No CT evidence of acute infarction.  Mild small vessel ischemic changes.  Intracranial atherosclerosis.  Cerebral volume is within normal limits.  No ventriculomegaly.  The visualized paranasal sinuses are essentially clear. The mastoid air cells are unopacified.  No evidence of calvarial fracture.  IMPRESSION: No evidence of acute intracranial abnormality.  Mild small vessel ischemic changes with intracranial atherosclerosis.   Electronically Signed   By: [name] M.D.   On: 09/04/2014 20:40     EKG Interpretation   Date/Time:  Friday September 04 2014 17:13:25 EDT Ventricular Rate:  95 PR Interval:  184 QRS Duration: 88 QT Interval:  358 QTC Calculation: 449 R Axis:   65 Text Interpretation:  Normal sinus rhythm T wave abnormality, consider  inferior  ischemia Abnormal ECG No acute changes EKG unchanged Confirmed by  Ceara Wrightson, MD, Ausar Georgiou [phone]) on 09/04/2014 10:45:24 PM      MDM   Final diagnoses:  Head ache  Chest pain, unspecified chest pain type    Pt comes in with chest pain. Atypical, positional - not exertional or pleuritic. Pt's EKG shows no acute findings -but there certainly are non specific findings, and hx of DM. Concerns for pain being from ACS still present, despite atypical nature of the pain. No evidence of pericarditis. Pt however doesn't want to be admitted. She rather get outpatient workup and follow up if she is not having an active heart attack.  Patient understands that her actions will lead to inadequate medical workup, and that she is at risk of complications of missed diagnosis, which includes morbidity and mortality.  Patient is demonstrating good capacity to make decision. Patient understands that he/she needs to return to the ER immediately if his/her symptoms get worse. She will stay for 2 cardiac enzyme draws, and is happy to stay in hospital if troponin is elevated.  Pt has no DKA, CXR is clear. Headaches - moderate, but new, and constant, no response to tylenol. CT head done, as pt is elderly with a new headache -and is neg. CT was done within 6 hours of onset of the headache and is neg, so with high sensitivity, SAH likelihood is extremely low. Pt responded to Toradol and reglan and headache resolved.  Stable for discharge at this time. Strict return precautions discussed.   Varney Biles, MD 09/04/14 2256

## 2014-09-04 NOTE — Discharge Instructions (Signed)
We saw you in the ER for the chest pain, headache. All of our cardiac workup is normal, including labs, EKG and chest X-RAY are normal.  The workup in the ER is not complete, and you should follow up with Cardiologist in few days.  Please return to the ER if your symptoms worsen; you have increased pain, fevers, chills, inability to keep any medications down, confusion. Otherwise see the outpatient doctor as requested.   Chest Pain (Nonspecific) It is often hard to give a specific diagnosis for the cause of chest pain. There is always a chance that your pain could be related to something serious, such as a heart attack or a blood clot in the lungs. You need to follow up with your health care provider for further evaluation. CAUSES   Heartburn.  Pneumonia or bronchitis.  Anxiety or stress.  Inflammation around your heart (pericarditis) or lung (pleuritis or pleurisy).  A blood clot in the lung.  A collapsed lung (pneumothorax). It can develop suddenly on its own (spontaneous pneumothorax) or from trauma to the chest.  Shingles infection (herpes zoster virus). The chest wall is composed of bones, muscles, and cartilage. Any of these can be the source of the pain.  The bones can be bruised by injury.  The muscles or cartilage can be strained by coughing or overwork.  The cartilage can be affected by inflammation and become sore (costochondritis). DIAGNOSIS  Lab tests or other studies may be needed to find the cause of your pain. Your health care provider may have you take a test called an ambulatory electrocardiogram (ECG). An ECG records your heartbeat patterns over a 24-hour period. You may also have other tests, such as:  Transthoracic echocardiogram (TTE). During echocardiography, sound waves are used to evaluate how blood flows through your heart.  Transesophageal echocardiogram (TEE).  Cardiac monitoring. This allows your health care provider to monitor your heart rate and  rhythm in real time.  Holter monitor. This is a portable device that records your heartbeat and can help diagnose heart arrhythmias. It allows your health care provider to track your heart activity for several days, if needed.  Stress tests by exercise or by giving medicine that makes the heart beat faster. TREATMENT   Treatment depends on what may be causing your chest pain. Treatment may include:  Acid blockers for heartburn.  Anti-inflammatory medicine.  Pain medicine for inflammatory conditions.  Antibiotics if an infection is present.  You may be advised to change lifestyle habits. This includes stopping smoking and avoiding alcohol, caffeine, and chocolate.  You may be advised to keep your head raised (elevated) when sleeping. This reduces the chance of acid going backward from your stomach into your esophagus. Most of the time, nonspecific chest pain will improve within 2-3 days with rest and mild pain medicine.  HOME CARE INSTRUCTIONS   If antibiotics were prescribed, take them as directed. Finish them even if you start to feel better.  For the next few days, avoid physical activities that bring on chest pain. Continue physical activities as directed.  Do not use any tobacco products, including cigarettes, chewing tobacco, or electronic cigarettes.  Avoid drinking alcohol.  Only take medicine as directed by your health care provider.  Follow your health care provider's suggestions for further testing if your chest pain does not go away.  Keep any follow-up appointments you made. If you do not go to an appointment, you could develop lasting (chronic) problems with pain. If there is any  problem keeping an appointment, call to reschedule. SEEK MEDICAL CARE IF:   Your chest pain does not go away, even after treatment.  You have a rash with blisters on your chest.  You have a fever. SEEK IMMEDIATE MEDICAL CARE IF:   You have increased chest pain or pain that spreads to  your arm, neck, jaw, back, or abdomen.  You have shortness of breath.  You have an increasing cough, or you cough up blood.  You have severe back or abdominal pain.  You feel nauseous or vomit.  You have severe weakness.  You faint.  You have chills. This is an emergency. Do not wait to see if the pain will go away. Get medical help at once. Call your local emergency services (911 in [place] Do not drive yourself to the hospital. MAKE SURE YOU:   Understand these instructions.  Will watch your condition.  Will get help right away if you are not doing well or get worse. Document Released: 08/09/2005 Document Revised: 11/04/2013 Document Reviewed: 06/04/2008 [facility] Patient Information 2015 [place], [place]. This information is not intended to replace advice given to you by your health care provider. Make sure you discuss any questions you have with your health care provider.

## 2014-09-04 NOTE — ED Notes (Signed)
Presents with 2 weeks of palpitations, 2 days of headache, hot and cold sweats and not feeling well. Denies SOB, denies nausea and vomiting. CBG here 294. Pt is alert and oriented, denies light and sound sensitivity.

## 2014-09-04 NOTE — ED Notes (Signed)
MD at bedside. 

## 2014-10-14 ENCOUNTER — Encounter: Payer: Self-pay | Admitting: *Deleted

## 2014-10-14 ENCOUNTER — Encounter: Payer: Medicare Other | Attending: Internal Medicine | Admitting: *Deleted

## 2014-10-14 VITALS — Ht 62.0 in | Wt 182.5 lb

## 2014-10-14 DIAGNOSIS — Z713 Dietary counseling and surveillance: Secondary | ICD-10-CM | POA: Diagnosis not present

## 2014-10-14 DIAGNOSIS — E119 Type 2 diabetes mellitus without complications: Secondary | ICD-10-CM | POA: Diagnosis present

## 2014-10-14 DIAGNOSIS — Z794 Long term (current) use of insulin: Secondary | ICD-10-CM | POA: Diagnosis not present

## 2014-10-14 NOTE — Patient Instructions (Signed)
Plan:  Aim for 2-3 Carb Choices per meal (30-45 grams) +/- 1 either way  Aim for 0-15 Carbs per snack if hungry  Include protein in moderation with your meals and snacks Consider reading food labels for Total Carbohydrate and Fat Grams of foods Consider  increasing your activity level daily as tolerated Consider checking BG at alternate times Fasting and 2 hours after your meal on different days as directed by MD  Continue taking medication  as directed by MD  Test sugar one type each day. First thing in the morning before breakfast    OR 2 hours after dinner Log all the numbers in your new book.

## 2014-10-14 NOTE — Progress Notes (Signed)
Diabetes Self-Management Education  Appt. Start Time: 1400 Appt. End Time: 6283  10/14/2014  Ms. Geraldo Docker, identified by name and date of birth, is a 63 y.o. female with a diagnosis of Diabetes: Type 2.  Other people present during visit:  Patient . Ms. Hannay notes that she has lacked diabetes instruction. She in anxious to learn about how to eat better and decrease her A1c to 6%. Her goal is to be able to discontinue her insulin. She only tests her glucose when she feels it is high at which time it is in the mid 200's. In review of her dietary intake, her breakfast meal is grossly elevated in carbohydrate intake.  ASSESSMENT  Height 5\' 2"  (1.575 m), weight 182 lb 8 oz (82.781 kg). Body mass index is 33.37 kg/(m^2).  Initial Visit Information:  Are you currently following a meal plan?: No  Are you taking your medications as prescribed?: Yes Are you checking your feet?: Yes How many days per week are you checking your feet?: 7 How often do you need to have someone help you when you read instructions, pamphlets, or other written materials from your doctor or pharmacy?: 1 - Never What is the last grade level you completed in school?: 10th grade + GED  Psychosocial:   Patient Belief/Attitude about Diabetes: Motivated to manage diabetes Self-care barriers: Low literacy Self-management support: Doctor's office, CDE visits, Family Other persons present: Patient Patient Concerns: Glycemic Control, Medication (goal is to be able to discontinue Insulin) Special Needs: None, Simplified materials Preferred Learning Style: No preference indicated Learning Readiness: Change in progress  Complications:   Last HgB A1C per patient/outside source: 10.3 mg/dL (03/01/14) How often do you check your blood sugar?: 1-2 times/day Fasting Blood glucose range (mg/dL): 180-200 Postprandial Blood glucose range (mg/dL): 180-200 Number of hypoglycemic episodes per month: 0 Number of hyperglycemic  episodes per week: 1 Can you tell when your blood sugar is high?: Yes (hot flashes, indigestion) What do you do if your blood sugar is high?: drink water, walk - retest Have you had a dilated eye exam in the past 12 months?: No Have you had a dental exam in the past 12 months?: No  Diet Intake:  Breakfast: 9:30: 3 sausage link, slice of bread / 2C oatmeal, meat/ 2C grits & eggs, meat, water Snack (morning): none Lunch: hamburger on toast, water /  Snack (afternoon): potato chips / croutons /  Dinner: bowl cabage, hamburger/  Snack (evening): none     Likes Pie Beverage(s): water, sprite Zero, coffee/splenda, sweet tea (splenda) when at a restaurant  Exercise:  Exercise: Light (walking / raking leaves) (has had a stroke which restricts activity) Light Exercise amount of time (min / week): 25  Individualized Plan for Diabetes Self-Management Training:   Learning Objective:  Patient will have a greater understanding of diabetes self-management. Patient education plan per assessed needs and concerns is to attend individual sessions      Education Topics Reviewed with Patient Today:   Role of diet in the treatment of diabetes and the relationship between the three main macronutrients and blood glucose level Role of exercise on diabetes management, blood pressure control and cardiac health. Taught/reviewed insulin injection, site rotation, insulin storage and needle disposal. Purpose and frequency of SMBG., Daily foot exams  Relationship between chronic complications and blood glucose control     PATIENTS GOALS/Plan (Developed by the patient):  Nutrition: General guidelines for healthy choices and portions discussed Physical Activity: Exercise 5-7 days per week  Medications: take my medication as prescribed Monitoring : test my blood glucose as discussed (note x per day with comment) (FBS or 2hpp daily and log) Reducing Risk: do foot checks daily, treat hypoglycemia with 15 grams of  carbs if blood glucose less than 70mg /dL  Patient Instructions  Plan:  Aim for 2-3 Carb Choices per meal (30-45 grams) +/- 1 either way  Aim for 0-15 Carbs per snack if hungry  Include protein in moderation with your meals and snacks Consider reading food labels for Total Carbohydrate and Fat Grams of foods Consider  increasing your activity level daily as tolerated Consider checking BG at alternate times Fasting and 2 hours after your meal on different days as directed by MD  Continue taking medication  as directed by MD  Test sugar one type each day. First thing in the morning before breakfast    OR 2 hours after dinner Log all the numbers in your new book.   Expected Outcomes:  Demonstrated interest in learning. Expect positive outcomes  Education material provided: Living Well with Diabetes, A1C conversion sheet, Meal plan card and Snack sheet  If problems or questions, patient to contact team via:  Phone  Future DSME appointment: 4-6 wks

## 2014-10-22 ENCOUNTER — Encounter (HOSPITAL_COMMUNITY): Payer: Self-pay | Admitting: Interventional Cardiology

## 2014-11-11 ENCOUNTER — Ambulatory Visit
Admission: RE | Admit: 2014-11-11 | Discharge: 2014-11-11 | Disposition: A | Discharge status: Home / Self Care | Payer: Medicare Other | Source: Ambulatory Visit

## 2014-11-11 DIAGNOSIS — Z1231 Encounter for screening mammogram for malignant neoplasm of breast: Secondary | ICD-10-CM

## 2014-11-12 ENCOUNTER — Ambulatory Visit: Payer: Medicare Other

## 2014-11-18 ENCOUNTER — Encounter: Payer: Medicare Other | Attending: Internal Medicine | Admitting: *Deleted

## 2014-11-18 DIAGNOSIS — E119 Type 2 diabetes mellitus without complications: Secondary | ICD-10-CM | POA: Insufficient documentation

## 2014-11-18 DIAGNOSIS — Z713 Dietary counseling and surveillance: Secondary | ICD-10-CM | POA: Diagnosis not present

## 2014-11-18 DIAGNOSIS — Z794 Long term (current) use of insulin: Secondary | ICD-10-CM | POA: Insufficient documentation

## 2014-11-18 NOTE — Patient Instructions (Addendum)
Please eat three meals per day Liver pudding, eggs & toast GOOD Cream of wheat / Grits  Have eggs/sausage Cheese & Crackers is good Peanut butter & crackers is good  The meals that you are eating are FABULOUS! Need to eat lunch!  Consider eating canned fruit which is soft. Rinse off the syrup.  Take 70/30 insulin with breakfast and dinner Have snack before bedtime (1/2 Liver pudding sandwich)  Get Flu shot and pneumonia shot

## 2014-11-18 NOTE — Progress Notes (Signed)
Diabetes Self-Management Education   Appt. Start Time: 1400 Appt. End Time: 1500  11/18/2014  [name]. [name], identified by name and date of birth, is a 64 y.o. female with a diagnosis of Diabetes: Type 2.  Other people present during visit:  Patient. [name] presents with her log book. She has tested her glucose 1-2 times per day for 2 weeks. She states she went out of town and forgot to take her meter. Her readings FBS range 179-313mg /dl, post meal 167-324mg /dl. She notes that her physician wanted her to increase her insulin from 30u to 35units BID but she didn't like the way it make her feel. Since we do not have a good sampling of glucose readings, I will see her again in 2 weeks to review the readings she has assured me she will provide.  Subsequent Visit Information:  Since your last visit, have you continued or began the use of a meal plan?: Yes How many days a week are you following a meal plan?: 7 Since your last visit, have you continued or began to exercise on a consistent basis?: No (has treadmill at home, get on and walks 2-3 minutes. Leg cramps up due to prior stroke. Planning to go to [facility] and walk their track) Since your last visit have you continued or begun to take your medications as prescribed?: Yes Since your last visit are you checking your feet?: Yes How many days per week are you checking your feet?: 7 Since your last visit, are you checking your blood glucose at least once a day?: No (tested for 2 weeks after appointment, went out of town and did not take glucometer)  Psychosocial:   Patient Belief/Attitude about Diabetes: Afraid Self-care barriers: Other (comment) (confused about DSME. Not eating or testing as encouraged) Self-management support: CDE visits Other persons present: Patient Patient Concerns: Glycemic Control Special Needs: Simplified materials Preferred Learning Style: No preference indicated Learning Readiness: Ready  Complications:   How often  do you check your blood sugar?: 1-2 times/day (encouraged to test 1-2 times daily. Has not tested for 2 weeks.) Fasting Blood glucose range (mg/dL): >200, 130-179 (167-324mg /dl) Postprandial Blood glucose range (mg/dL): 180-200, >200 (179-313mg /dl)  Diet Intake:  Breakfast: liver pudding 2 pieces, 2 bread, egg, coffee Lunch: crackers and cheese Dinner: Rice/cheddar/brocolli, boiled chicken, boiled okra  Exercise:  Exercise: Light (walking / raking leaves)   Individualized Plan for Diabetes Self-Management Training:   Learning Objective:  Patient will have a greater understanding of diabetes self-management. Patient education plan per assessed needs and concerns is to attend individual sessions    Education Topics Reviewed with Patient Today:   Role of diet in the treatment of diabetes and the relationship between the three main macronutrients and blood glucose level, Information on hints to eating out and maintain blood glucose control. Role of exercise on diabetes management, blood pressure control and cardiac health. Reviewed patients medication for diabetes, action, purpose, timing of dose and side effects. Purpose and frequency of SMBG.  Relationship between chronic complications and blood glucose control   Lifestyle issues that need to be addressed for better diabetes care  PATIENTS GOALS/Plan (Developed by the patient):  Nutrition: General guidelines for healthy choices and portions discussed Physical Activity: Exercise 3-5 times per week Medications: take my medication as prescribed Monitoring : test my blood glucose as discussed (note x per day with comment) (1-2 times daily FBS & 2hpp dinner)  Patient Self Evaluation of Goals - Patient rates self as meeting previously  set goals:   Nutrition: 50 - 75 % Physical Activity: < 25% Medications: >75% Monitoring: 25 - 50%  Patient Instructions  Please eat three meals per day Liver pudding, eggs & toast GOOD Cream of  wheat / Grits  Have eggs/sausage Cheese & Crackers is good Peanut butter & crackers is good  The meals that you are eating are FABULOUS! Need to eat lunch!  Consider eating canned fruit which is soft. Rinse off the syrup.  Take 70/30 insulin with breakfast and dinner Have snack before bedtime (1/2 Liver pudding sandwich)  Get Flu shot and pneumonia shot  Expected Outcomes:  Demonstrated interest in learning. Expect positive outcomes  Education material provided: Meal plan card  If problems or questions, patient to contact team via:  Phone  Future DSME appointment: - 2 wks

## 2014-12-02 ENCOUNTER — Ambulatory Visit: Payer: Medicare Other | Admitting: *Deleted

## 2014-12-26 ENCOUNTER — Encounter (HOSPITAL_COMMUNITY): Payer: Self-pay | Admitting: Emergency Medicine

## 2014-12-26 DIAGNOSIS — R21 Rash and other nonspecific skin eruption: Secondary | ICD-10-CM | POA: Insufficient documentation

## 2014-12-26 DIAGNOSIS — Z5181 Encounter for therapeutic drug level monitoring: Secondary | ICD-10-CM | POA: Diagnosis not present

## 2014-12-26 DIAGNOSIS — Z794 Long term (current) use of insulin: Secondary | ICD-10-CM | POA: Insufficient documentation

## 2014-12-26 DIAGNOSIS — E1165 Type 2 diabetes mellitus with hyperglycemia: Secondary | ICD-10-CM | POA: Insufficient documentation

## 2014-12-26 LAB — CBG MONITORING, ED: Glucose-Capillary: 199 mg/dL — ABNORMAL HIGH (ref 70–99)

## 2014-12-26 NOTE — ED Notes (Signed)
Pt. reports elevated blood sugar at home this evening 203  , pt, stated she ran out of her insulin for 3 days . Mild dizziness and nausea.

## 2014-12-27 ENCOUNTER — Emergency Department (HOSPITAL_COMMUNITY)
Admission: EM | Admit: 2014-12-27 | Discharge: 2014-12-27 | Disposition: A | Discharge status: Home / Self Care | Payer: Medicare Other | Attending: Emergency Medicine | Admitting: Emergency Medicine

## 2014-12-27 DIAGNOSIS — R21 Rash and other nonspecific skin eruption: Secondary | ICD-10-CM | POA: Diagnosis not present

## 2014-12-27 DIAGNOSIS — T7840XA Allergy, unspecified, initial encounter: Secondary | ICD-10-CM

## 2014-12-27 LAB — CBG MONITORING, ED: Glucose-Capillary: 197 mg/dL — ABNORMAL HIGH (ref 70–99)

## 2014-12-27 MED ORDER — DIPHENHYDRAMINE HCL 25 MG PO CAPS
25.0000 mg | ORAL_CAPSULE | Freq: Once | ORAL | Status: AC
  Administered 2014-12-27: 25 mg via ORAL
  Filled 2014-12-27: qty 1

## 2014-12-27 MED ORDER — DIPHENHYDRAMINE HCL 25 MG PO TABS
25.0000 mg | ORAL_TABLET | Freq: Four times a day (QID) | ORAL | Status: DC
Start: 2014-12-27 — End: 2015-06-01

## 2014-12-27 MED ORDER — FAMOTIDINE 20 MG PO TABS
20.0000 mg | ORAL_TABLET | Freq: Once | ORAL | Status: AC
  Administered 2014-12-27: 20 mg via ORAL
  Filled 2014-12-27: qty 1

## 2014-12-27 MED ORDER — FAMOTIDINE 20 MG PO TABS: 20.0000 mg | ORAL_TABLET | Freq: Two times a day (BID) | ORAL | Status: DC

## 2014-12-27 NOTE — ED Notes (Signed)
MD Campos at bedside.  

## 2014-12-27 NOTE — ED Notes (Signed)
Patient states she got switched to a new insulin and started breaking out in hives and having edema in her arms, stopped taking insulin on Thursday and called her MD who switched her to an anti diabetic pill that patient began yesterday. Pt states today she kept checking her blood sugar and it was elevated.

## 2014-12-27 NOTE — ED Provider Notes (Signed)
CSN: [id number]     Arrival date & time 12/26/14  2319 History   First MD Initiated Contact with Patient 12/27/14 0141     Chief Complaint  Patient presents with  . Hyperglycemia   Patient is a 64 y.o. female presenting with hyperglycemia. The history is provided by the patient. No language interpreter was used.  Hyperglycemia Associated symptoms: no fever    This chart was scribed for Hoy Morn, MD by Thea Alken, ED Scribe. This patient was seen in room A06C/A06C and the patient's care was started at 2:18 AM.  HPI Comments:  [name] is a 64 y.o. female who present to the Emergency Department complaining of rash. Pt had a medication change 3 days ago from novolog to Humalog. She reports after taking 2 shots of Humalog 3 days ago she began to breakout in itchy hives. She states her PCP, Dr. Einar Gip, told her to stop taking Humalog and wrote her a prescription of oseni, but states rash has persisted. Pt has applied hydrocortisone without relief. She has hx of diabetes and reports blood sugar has increased.  Pt denies asthma.   Past Medical History  Diagnosis Date  . CVA (cerebral vascular accident) 01/2011    affected rt side  . Diabetes mellitus   . Hypertension   . Arteriosclerotic cardiovascular disease   . Hypothyroidism   . Hypocholesterolemia   . Renal insufficiency   . Anemia   . Vitamin D deficiency   . Peripheral vascular disease   . GERD (gastroesophageal reflux disease)   . Claudication in peripheral vascular disease   . Hyperlipidemia    Past Surgical History  Procedure Laterality Date  . Tubal ligation    . Cesarean section    . Left heart catheterization with coronary angiogram N/A 03/08/2012    Procedure: LEFT HEART CATHETERIZATION WITH CORONARY ANGIOGRAM;  Surgeon: Sinclair Grooms, MD;  Location: [facility] CATH LAB;  Service: Cardiovascular;  Laterality: N/A;  . Lower extremity angiogram N/A 12/17/2012    Procedure: LOWER EXTREMITY ANGIOGRAM;  Surgeon: Laverda Page, MD;  Location: [facility] CATH LAB;  Service: Cardiovascular;  Laterality: N/A;  . Left heart catheterization with coronary angiogram N/A 12/17/2012    Procedure: LEFT HEART CATHETERIZATION WITH CORONARY ANGIOGRAM;  Surgeon: Laverda Page, MD;  Location: [facility] CATH LAB;  Service: Cardiovascular;  Laterality: N/A;   Family History  Problem Relation Age of Onset  . Diabetes Mother   . Diabetes Father   . Diabetes Sister   . Diabetes Brother   . Colon cancer Brother   . Esophageal cancer Neg Hx   . Rectal cancer Neg Hx   . Stomach cancer Neg Hx    History  Substance Use Topics  . Smoking status: Former Smoker    Quit date: 02/04/2011  . Smokeless tobacco: Never Used  . Alcohol Use: No   OB History    No data available     Review of Systems  Constitutional: Negative for fever and chills.  Skin: Positive for rash.   Allergies  Review of patient's allergies indicates no known allergies.  Home Medications   Prior to Admission medications   Medication Sig Start Date End Date Taking? Authorizing Provider  aspirin EC 81 MG tablet Take 81 mg by mouth daily.    Historical Provider, MD  cilostazol (PLETAL) 100 MG tablet Take 100 mg by mouth 2 (two) times daily as needed (muscle spasms).    Historical Provider, MD  clopidogrel (  PLAVIX) 75 MG tablet Take 75 mg by mouth every morning.     Historical Provider, MD  furosemide (LASIX) 40 MG tablet Take 40 mg by mouth daily.     Historical Provider, MD  insulin aspart protamine- aspart (NOVOLOG MIX 70/30) (70-30) 100 UNIT/ML injection Inject 0.3 mLs (30 Units total) into the skin 2 (two) times daily with a meal. 03/02/14   Delfina Redwood, MD  levothyroxine (SYNTHROID) 88 MCG tablet Take 1 tablet (88 mcg total) by mouth daily before breakfast. 03/02/14   Delfina Redwood, MD  losartan (COZAAR) 100 MG tablet Take 100 mg by mouth daily.    Historical Provider, MD  omega-3 acid ethyl esters (LOVAZA) 1 G capsule Take 2 g by mouth daily.      Historical Provider, MD   BP 162/69 mmHg  Pulse 76  Temp(Src) 97.8 F (36.6 C) (Oral)  Resp 18  Ht 5\' 2"  (1.575 m)  Wt 182 lb (82.555 kg)  BMI 33.28 kg/m2  SpO2 95% Physical Exam  Constitutional: She is oriented to person, place, and time. She appears well-developed and well-nourished. No distress.  HENT:  Head: Normocephalic and atraumatic.  Eyes: Conjunctivae and EOM are normal.  Neck: Neck supple.  Cardiovascular: Normal rate.   Pulmonary/Chest: Effort normal.  Musculoskeletal: Normal range of motion.  Neurological: She is alert and oriented to person, place, and time.  Skin: Skin is warm and dry.  5 punctate non pustular lesion of left anterior and posterior forearm without surrounding erythema. No lip lesions or oral lesions.   Psychiatric: She has a normal mood and affect. Her behavior is normal.  Nursing note and vitals reviewed.   ED Course  Procedures (including critical care time) DIAGNOSTIC STUDIES: Oxygen Saturation is 95% on RA, normal by my interpretation.    COORDINATION OF CARE: 2:29 AM- Pt advised of plan for treatment and pt agrees.   Labs Review Labs Reviewed  CBG MONITORING, ED - Abnormal; Notable for the following:    Glucose-Capillary 199 (*)    All other components within normal limits  CBG MONITORING, ED - Abnormal; Notable for the following:    Glucose-Capillary 197 (*)    All other components within normal limits  I-STAT CHEM 8, ED    Imaging Review No results found.   EKG Interpretation None      MDM   Final diagnoses:  None    Patient is overall well-appearing.  This could represent an allergic reaction on that are not classic for hives.  They do seem to itch which makes allergy likely.  She will be treated with Pepcid and Benadryl.  She has artery stopped her Humalog was started on a oral medication by her primary care team.  I've asked that she follow-up with her primary care team on Tuesday.  In the meantime she can return to  the ER for any new or worsening symptoms.  I personally performed the services described in this documentation, which was scribed in my presence. The recorded information has been reviewed and is accurate.     Hoy Morn, MD 12/27/14 [phone]

## 2014-12-27 NOTE — ED Notes (Signed)
Discharge instructions and prescriptions reviewed.  Voiced understanding.

## 2014-12-27 NOTE — ED Notes (Signed)
Blood glucose=197

## 2015-03-17 ENCOUNTER — Encounter: Payer: Self-pay | Admitting: *Deleted

## 2015-04-07 ENCOUNTER — Encounter (HOSPITAL_COMMUNITY): Payer: Self-pay | Admitting: Emergency Medicine

## 2015-04-07 ENCOUNTER — Emergency Department (HOSPITAL_COMMUNITY): Payer: Medicare Other

## 2015-04-07 ENCOUNTER — Emergency Department (HOSPITAL_COMMUNITY)
Admission: EM | Admit: 2015-04-07 | Discharge: 2015-04-07 | Disposition: A | Discharge status: Home / Self Care | Payer: Medicare Other | Attending: Emergency Medicine | Admitting: Emergency Medicine

## 2015-04-07 DIAGNOSIS — Z9851 Tubal ligation status: Secondary | ICD-10-CM | POA: Insufficient documentation

## 2015-04-07 DIAGNOSIS — R1031 Right lower quadrant pain: Secondary | ICD-10-CM | POA: Insufficient documentation

## 2015-04-07 DIAGNOSIS — R739 Hyperglycemia, unspecified: Secondary | ICD-10-CM

## 2015-04-07 DIAGNOSIS — Z8673 Personal history of transient ischemic attack (TIA), and cerebral infarction without residual deficits: Secondary | ICD-10-CM | POA: Insufficient documentation

## 2015-04-07 DIAGNOSIS — Z7902 Long term (current) use of antithrombotics/antiplatelets: Secondary | ICD-10-CM | POA: Diagnosis not present

## 2015-04-07 DIAGNOSIS — Z87448 Personal history of other diseases of urinary system: Secondary | ICD-10-CM | POA: Diagnosis not present

## 2015-04-07 DIAGNOSIS — Z79899 Other long term (current) drug therapy: Secondary | ICD-10-CM | POA: Diagnosis not present

## 2015-04-07 DIAGNOSIS — I10 Essential (primary) hypertension: Secondary | ICD-10-CM | POA: Insufficient documentation

## 2015-04-07 DIAGNOSIS — K219 Gastro-esophageal reflux disease without esophagitis: Secondary | ICD-10-CM | POA: Insufficient documentation

## 2015-04-07 DIAGNOSIS — D649 Anemia, unspecified: Secondary | ICD-10-CM | POA: Diagnosis not present

## 2015-04-07 DIAGNOSIS — I2510 Atherosclerotic heart disease of native coronary artery without angina pectoris: Secondary | ICD-10-CM | POA: Insufficient documentation

## 2015-04-07 DIAGNOSIS — Z87891 Personal history of nicotine dependence: Secondary | ICD-10-CM | POA: Insufficient documentation

## 2015-04-07 DIAGNOSIS — E1165 Type 2 diabetes mellitus with hyperglycemia: Secondary | ICD-10-CM | POA: Diagnosis not present

## 2015-04-07 DIAGNOSIS — Z862 Personal history of diseases of the blood and blood-forming organs and certain disorders involving the immune mechanism: Secondary | ICD-10-CM | POA: Diagnosis not present

## 2015-04-07 LAB — URINALYSIS, ROUTINE W REFLEX MICROSCOPIC
BILIRUBIN URINE: NEGATIVE
HGB URINE DIPSTICK: NEGATIVE
Ketones, ur: NEGATIVE mg/dL
Leukocytes, UA: NEGATIVE
NITRITE: NEGATIVE
PROTEIN: NEGATIVE mg/dL
Specific Gravity, Urine: 1.03 (ref 1.005–1.030)
UROBILINOGEN UA: 0.2 mg/dL (ref 0.0–1.0)
pH: 6 (ref 5.0–8.0)

## 2015-04-07 LAB — COMPREHENSIVE METABOLIC PANEL
ALBUMIN: 3.7 g/dL (ref 3.5–5.0)
ALT: 25 U/L (ref 14–54)
AST: 25 U/L (ref 15–41)
Alkaline Phosphatase: 89 U/L (ref 38–126)
Anion gap: 10 (ref 5–15)
BILIRUBIN TOTAL: 0.3 mg/dL (ref 0.3–1.2)
BUN: 17 mg/dL (ref 6–20)
CO2: 26 mmol/L (ref 22–32)
Calcium: 9.4 mg/dL (ref 8.9–10.3)
Chloride: 100 mmol/L — ABNORMAL LOW (ref 101–111)
Creatinine, Ser: 1.22 mg/dL — ABNORMAL HIGH (ref 0.44–1.00)
GFR, EST AFRICAN AMERICAN: 53 mL/min — AB (ref >60)
GFR, EST NON AFRICAN AMERICAN: 46 mL/min — AB (ref >60)
Glucose, Bld: 355 mg/dL — ABNORMAL HIGH (ref 65–99)
POTASSIUM: 4 mmol/L (ref 3.5–5.1)
Sodium: 136 mmol/L (ref 135–145)
Total Protein: 7.6 g/dL (ref 6.5–8.1)

## 2015-04-07 LAB — CBC WITH DIFFERENTIAL/PLATELET
Basophils Absolute: 0 10*3/uL (ref 0.0–0.1)
Basophils Relative: 0 % (ref 0–1)
Eosinophils Absolute: 0.1 10*3/uL (ref 0.0–0.7)
Eosinophils Relative: 1 % (ref 0–5)
HCT: 35.7 % — ABNORMAL LOW (ref 36.0–46.0)
HEMOGLOBIN: 11.7 g/dL — AB (ref 12.0–15.0)
LYMPHS ABS: 1.8 10*3/uL (ref 0.7–4.0)
LYMPHS PCT: 39 % (ref 12–46)
MCH: 28.7 pg (ref 26.0–34.0)
MCHC: 32.8 g/dL (ref 30.0–36.0)
MCV: 87.7 fL (ref 78.0–100.0)
Monocytes Absolute: 0.3 10*3/uL (ref 0.1–1.0)
Monocytes Relative: 6 % (ref 3–12)
NEUTROS PCT: 54 % (ref 43–77)
Neutro Abs: 2.5 10*3/uL (ref 1.7–7.7)
Platelets: 209 10*3/uL (ref 150–400)
RBC: 4.07 MIL/uL (ref 3.87–5.11)
RDW: 15 % (ref 11.5–15.5)
WBC: 4.6 10*3/uL (ref 4.0–10.5)

## 2015-04-07 LAB — URINE MICROSCOPIC-ADD ON

## 2015-04-07 LAB — CBG MONITORING, ED
Glucose-Capillary: 300 mg/dL — ABNORMAL HIGH (ref 65–99)
Glucose-Capillary: 222 mg/dL — ABNORMAL HIGH (ref 65–99)

## 2015-04-07 MED ORDER — INSULIN ASPART 100 UNIT/ML ~~LOC~~ SOLN
10.0000 [IU] | Freq: Once | SUBCUTANEOUS | Status: AC
  Administered 2015-04-07: 10 [IU] via INTRAVENOUS
  Filled 2015-04-07: qty 1

## 2015-04-07 MED ORDER — IOHEXOL 300 MG/ML  SOLN
25.0000 mL | Freq: Once | INTRAMUSCULAR | Status: AC | PRN
  Administered 2015-04-07: 25 mL via ORAL

## 2015-04-07 MED ORDER — IOHEXOL 300 MG/ML  SOLN
80.0000 mL | Freq: Once | INTRAMUSCULAR | Status: AC | PRN
  Administered 2015-04-07: 100 mL via INTRAVENOUS

## 2015-04-07 MED ORDER — SODIUM CHLORIDE 0.9 % IV BOLUS (SEPSIS)
1000.0000 mL | Freq: Once | INTRAVENOUS | Status: AC
  Administered 2015-04-07: 1000 mL via INTRAVENOUS

## 2015-04-07 MED ORDER — FERROUS SULFATE 325 (65 FE) MG PO TABS: 325.0000 mg | ORAL_TABLET | Freq: Every day | ORAL | Status: DC

## 2015-04-07 NOTE — Discharge Instructions (Signed)
Return to the emergency room with worsening of symptoms, new symptoms or with symptoms that are concerning , especially fevers, abdominal pain in one area, unable to keep down fluids, blood in stool or vomit, severe pain, you feel faint, lightheaded or pass out. Please call your doctor for a followup appointment within 24-48 hours. When you talk to your doctor please let them know that you were seen in the emergency department and have them acquire all of your records so that they can discuss the findings with you and formulate a treatment plan to fully care for your new and ongoing problems.  Read below information and follow recommendations.   Abdominal Pain Many things can cause abdominal pain. Usually, abdominal pain is not caused by a disease and will improve without treatment. It can often be observed and treated at home. Your health care provider will do a physical exam and possibly order blood tests and X-rays to help determine the seriousness of your pain. However, in many cases, more time must pass before a clear cause of the pain can be found. Before that point, your health care provider may not know if you need more testing or further treatment. HOME CARE INSTRUCTIONS  Monitor your abdominal pain for any changes. The following actions may help to alleviate any discomfort you are experiencing:  Only take over-the-counter or prescription medicines as directed by your health care provider.  Do not take laxatives unless directed to do so by your health care provider.  Try a clear liquid diet (broth, tea, or water) as directed by your health care provider. Slowly move to a bland diet as tolerated. SEEK MEDICAL CARE IF:  You have unexplained abdominal pain.  You have abdominal pain associated with nausea or diarrhea.  You have pain when you urinate or have a bowel movement.  You experience abdominal pain that wakes you in the night.  You have abdominal pain that is worsened or improved  by eating food.  You have abdominal pain that is worsened with eating fatty foods.  You have a fever. SEEK IMMEDIATE MEDICAL CARE IF:   Your pain does not go away within 2 hours.  You keep throwing up (vomiting).  Your pain is felt only in portions of the abdomen, such as the right side or the left lower portion of the abdomen.  You pass bloody or black tarry stools. MAKE SURE YOU:  Understand these instructions.   Will watch your condition.   Will get help right away if you are not doing well or get worse.  Document Released: 08/09/2005 Document Revised: 11/04/2013 Document Reviewed: 07/09/2013 [facility] Patient Information 2015 [place], [place]. This information is not intended to replace advice given to you by your health care provider. Make sure you discuss any questions you have with your health care provider. Hyperglycemia Hyperglycemia occurs when the glucose (sugar) in your blood is too high. Hyperglycemia can happen for many reasons, but it most often happens to people who do not know they have diabetes or are not managing their diabetes properly.  CAUSES  Whether you have diabetes or not, there are other causes of hyperglycemia. Hyperglycemia can occur when you have diabetes, but it can also occur in other situations that you might not be as aware of, such as: Diabetes  If you have diabetes and are having problems controlling your blood glucose, hyperglycemia could occur because of some of the following reasons:  Not following your meal plan.  Not taking your diabetes medications or  not taking it properly.  Exercising less or doing less activity than you normally do.  Being sick. Pre-diabetes  This cannot be ignored. Before people develop Type 2 diabetes, they almost always have "pre-diabetes." This is when your blood glucose levels are higher than normal, but not yet high enough to be diagnosed as diabetes. Research has shown that some long-term damage to the body,  especially the heart and circulatory system, may already be occurring during pre-diabetes. If you take action to manage your blood glucose when you have pre-diabetes, you may delay or prevent Type 2 diabetes from developing. Stress  If you have diabetes, you may be "diet" controlled or on oral medications or insulin to control your diabetes. However, you may find that your blood glucose is higher than usual in the hospital whether you have diabetes or not. This is often referred to as "stress hyperglycemia." Stress can elevate your blood glucose. This happens because of hormones put out by the body during times of stress. If stress has been the cause of your high blood glucose, it can be followed regularly by your caregiver. That way he/she can make sure your hyperglycemia does not continue to get worse or progress to diabetes. Steroids  Steroids are medications that act on the infection fighting system (immune system) to block inflammation or infection. One side effect can be a rise in blood glucose. Most people can produce enough extra insulin to allow for this rise, but for those who cannot, steroids make blood glucose levels go even higher. It is not unusual for steroid treatments to "uncover" diabetes that is developing. It is not always possible to determine if the hyperglycemia will go away after the steroids are stopped. A special blood test called an A1c is sometimes done to determine if your blood glucose was elevated before the steroids were started. SYMPTOMS  Thirsty.  Frequent urination.  Dry mouth.  Blurred vision.  Tired or fatigue.  Weakness.  Sleepy.  Tingling in feet or leg. DIAGNOSIS  Diagnosis is made by monitoring blood glucose in one or all of the following ways:  A1c test. This is a chemical found in your blood.  Fingerstick blood glucose monitoring.  Laboratory results. TREATMENT  First, knowing the cause of the hyperglycemia is important before the  hyperglycemia can be treated. Treatment may include, but is not be limited to:  Education.  Change or adjustment in medications.  Change or adjustment in meal plan.  Treatment for an illness, infection, etc.  More frequent blood glucose monitoring.  Change in exercise plan.  Decreasing or stopping steroids.  Lifestyle changes. HOME CARE INSTRUCTIONS   Test your blood glucose as directed.  Exercise regularly. Your caregiver will give you instructions about exercise. Pre-diabetes or diabetes which comes on with stress is helped by exercising.  Eat wholesome, balanced meals. Eat often and at regular, fixed times. Your caregiver or nutritionist will give you a meal plan to guide your sugar intake.  Being at an ideal weight is important. If needed, losing as little as 10 to 15 pounds may help improve blood glucose levels. SEEK MEDICAL CARE IF:   You have questions about medicine, activity, or diet.  You continue to have symptoms (problems such as increased thirst, urination, or weight gain). SEEK IMMEDIATE MEDICAL CARE IF:   You are vomiting or have diarrhea.  Your breath smells fruity.  You are breathing faster or slower.  You are very sleepy or incoherent.  You have numbness, tingling, or pain  in your feet or hands.  You have chest pain.  Your symptoms get worse even though you have been following your caregiver's orders.  If you have any other questions or concerns. Document Released: 04/25/2001 Document Revised: 01/22/2012 Document Reviewed: 02/26/2012 [facility] Patient Information 2015 [place], [place]. This information is not intended to replace advice given to you by your health care provider. Make sure you discuss any questions you have with your health care provider.

## 2015-04-07 NOTE — ED Provider Notes (Signed)
Care assumed from [facility] PA-C at shift change.  [name] is a 64 y.o. female with PMH of diabetes, hypertension, CVA presenting with hyperglycemia. Patient also reports right lower quadrant abdominal tenderness.   Plan: CT abdomen to rule out appendicitis. If negative plan discharge home with PCP follow-up for diabetes management.  Meds given in ED:  Medications  insulin aspart (novoLOG) injection 10 Units (10 Units Intravenous Given 04/07/15 1444)  sodium chloride 0.9 % bolus 1,000 mL (0 mLs Intravenous Stopped 04/07/15 1602)  iohexol (OMNIPAQUE) 300 MG/ML solution 25 mL (25 mLs Oral Contrast Given 04/07/15 1531)  iohexol (OMNIPAQUE) 300 MG/ML solution 80 mL (100 mLs Intravenous Contrast Given 04/07/15 1646)    New Prescriptions   No medications on file      MDM 1. Hyperglycemia   2. RLQ abdominal pain   3. Anemia, unspecified anemia type    Patient presenting with hyperglycemia and was given insulin and fluids with resulting CBG of 220. Patient to follow-up with PCP for diabetes management. Patient with right lower quadrant abdominal tenderness. Vitals stable. Patient denies any pain at this time. No abdominal tenderness on exam. CT abdomen pelvis without evidence of peritonitis. Incidental findings of CT abdomen discussed with pt and placed in discharge summary for further outpatient workup with PCP. No acute problems requiring emergent intervention noted. Patient is afebrile, nontoxic, and in no acute distress. Patient is appropriate for outpatient management and is stable for discharge.  Discussed return precautions with patient. Patient verbalizes understanding and agrees with plan.    Filed Vitals:   04/07/15 1515 04/07/15 1545 04/07/15 1600 04/07/15 1715  BP: 160/66 145/66 138/67 139/70  Pulse: 78 69 71 81  Temp:      TempSrc:      Resp: 16  16   SpO2: 99% 99% 98% 98%   Results for orders placed or performed during the hospital encounter of 04/07/15   Comprehensive metabolic panel  Result Value Ref Range   Sodium 136 135 - 145 mmol/L   Potassium 4.0 3.5 - 5.1 mmol/L   Chloride 100 (L) 101 - 111 mmol/L   CO2 26 22 - 32 mmol/L   Glucose, Bld 355 (H) 65 - 99 mg/dL   BUN 17 6 - 20 mg/dL   Creatinine, Ser 1.22 (H) 0.44 - 1.00 mg/dL   Calcium 9.4 8.9 - 10.3 mg/dL   Total Protein 7.6 6.5 - 8.1 g/dL   Albumin 3.7 3.5 - 5.0 g/dL   AST 25 15 - 41 U/L   ALT 25 14 - 54 U/L   Alkaline Phosphatase 89 38 - 126 U/L   Total Bilirubin 0.3 0.3 - 1.2 mg/dL   GFR calc non Af Amer 46 (L) >60 mL/min   GFR calc Af Amer 53 (L) >60 mL/min   Anion gap 10 5 - 15  Urinalysis, Routine w reflex microscopic  Result Value Ref Range   Color, Urine YELLOW YELLOW   APPearance CLEAR CLEAR   Specific Gravity, Urine 1.030 1.005 - 1.030   pH 6.0 5.0 - 8.0   Glucose, UA >1000 (A) NEGATIVE mg/dL   Hgb urine dipstick NEGATIVE NEGATIVE   Bilirubin Urine NEGATIVE NEGATIVE   Ketones, ur NEGATIVE NEGATIVE mg/dL   Protein, ur NEGATIVE NEGATIVE mg/dL   Urobilinogen, UA 0.2 0.0 - 1.0 mg/dL   Nitrite NEGATIVE NEGATIVE   Leukocytes, UA NEGATIVE NEGATIVE  CBC with Differential  Result Value Ref Range   WBC 4.6 4.0 - 10.5 K/uL  RBC 4.07 3.87 - 5.11 MIL/uL   Hemoglobin 11.7 (L) 12.0 - 15.0 g/dL   HCT 35.7 (L) 36.0 - 46.0 %   MCV 87.7 78.0 - 100.0 fL   MCH 28.7 26.0 - 34.0 pg   MCHC 32.8 30.0 - 36.0 g/dL   RDW 15.0 11.5 - 15.5 %   Platelets 209 150 - 400 K/uL   Neutrophils Relative % 54 43 - 77 %   Neutro Abs 2.5 1.7 - 7.7 K/uL   Lymphocytes Relative 39 12 - 46 %   Lymphs Abs 1.8 0.7 - 4.0 K/uL   Monocytes Relative 6 3 - 12 %   Monocytes Absolute 0.3 0.1 - 1.0 K/uL   Eosinophils Relative 1 0 - 5 %   Eosinophils Absolute 0.1 0.0 - 0.7 K/uL   Basophils Relative 0 0 - 1 %   Basophils Absolute 0.0 0.0 - 0.1 K/uL  Urine microscopic-add on  Result Value Ref Range   Squamous Epithelial / LPF FEW (A) RARE   WBC, UA 3-6 <3 WBC/hpf   Bacteria, UA RARE RARE  CBG  monitoring, ED  Result Value Ref Range   Glucose-Capillary 300 (H) 65 - 99 mg/dL  CBG monitoring, ED  Result Value Ref Range   Glucose-Capillary 222 (H) 65 - 99 mg/dL   Ct Abdomen Pelvis W Contrast  04/07/2015   CLINICAL DATA:  Increased hyperglycemia over the last several days. Possible yeast infection. Right lower quadrant abdominal pain with constipation. Initial encounter.  EXAM: CT ABDOMEN AND PELVIS WITH CONTRAST  TECHNIQUE: Multidetector CT imaging of the abdomen and pelvis was performed using the standard protocol following bolus administration of intravenous contrast.  CONTRAST:  165mL OMNIPAQUE IOHEXOL 300 MG/ML  SOLN  COMPARISON:  Abdominal ultrasound 06/27/2007. Abdominal pelvic CT 06/24/2007.  FINDINGS: Lower chest: Clear lung bases. No significant pleural or pericardial effusion.  Hepatobiliary: There is diffuse mildly heterogeneous hepatic steatosis and mild hepatomegaly. No focal liver lesions identified. No evidence of gallstones, gallbladder wall thickening or biliary dilatation.  Pancreas: Unremarkable. No pancreatic ductal dilatation or surrounding inflammatory changes.  Spleen: Normal in size without focal abnormality.  Adrenals/Urinary Tract: Both adrenal glands appear normal.The left kidney is mildly atrophy. There is cortical scarring in the upper poles of both kidneys. There is a small nonobstructing calculus in the upper pole of the right kidney which is similar to the prior study. No evidence of hydronephrosis, ureteral or bladder calculus. No bladder abnormality seen.  Stomach/Bowel: No evidence of bowel wall thickening, distention or surrounding inflammatory change.The appendix appears normal.  Vascular/Lymphatic: There are no enlarged abdominal or pelvic lymph nodes. Extensive atherosclerosis of the aorta, its branches and the iliac arteries.  Reproductive: The uterus and ovaries appear normal. No evidence of adnexal mass. Possible tubal ligation clip on the right, unchanged.   Other: Small umbilical hernia containing only fat. Previously demonstrated ascites has resolved.  Musculoskeletal: No acute or significant osseous findings. Stable lumbar spondylosis.  IMPRESSION: 1. No acute findings or explanation for the patient's symptoms. No evidence of appendicitis. 2. Cortical scarring in the upper poles of both kidneys, similar to prior examination. Nonobstructing calculus in the upper pole of the right kidney. 3. Heterogeneous hepatic steatosis. 4. Resolved ascites and bowel wall thickening. 5. Extensive atherosclerosis.   Electronically Signed   By: Richardean Sale M.D.   On: 04/07/2015 17:24       Al Corpus, PA-C 04/07/15 1747  Charlesetta Shanks, MD 04/09/15 [phone]

## 2015-04-07 NOTE — ED Notes (Signed)
Pt sts hyperglycemia more than usual x several days; pt sts "thinks she has yeast infection"

## 2015-04-07 NOTE — ED Provider Notes (Signed)
CSN: [id number]     Arrival date & time 04/07/15  1217 History   First MD Initiated Contact with Patient 04/07/15 1346     Chief Complaint  Patient presents with  . Hyperglycemia     (Consider location/radiation/quality/duration/timing/severity/associated sxs/prior Treatment) HPI Comments: Patient is a 64 year old female with a past medical history of diabetes, hypertension, and previous CVA who presents with abdominal pain for 1 week. The pain is located in the RLQ and does not radiate. The pain is described as aching and severe. The pain started gradually and progressively worsened since the onset. No alleviating/aggravating factors. The patient has tried home nothing for symptoms without relief. Associated symptoms include hyperglycemia and constipation. Patient denies fever, headache, NVD, chest pain, SOB, dysuria, constipation. Patient reports previous tubal ligation.   Patient is a 64 y.o. female presenting with hyperglycemia.  Hyperglycemia Associated symptoms: abdominal pain   Associated symptoms: no chest pain, no dizziness, no dysuria, no fatigue, no fever, no nausea, no shortness of breath, no vomiting and no weakness     Past Medical History  Diagnosis Date  . CVA (cerebral vascular accident) 01/2011    affected rt side  . Diabetes mellitus   . Hypertension   . Arteriosclerotic cardiovascular disease   . Hypothyroidism   . Hypocholesterolemia   . Renal insufficiency   . Anemia   . Vitamin D deficiency   . Peripheral vascular disease   . GERD (gastroesophageal reflux disease)   . Claudication in peripheral vascular disease   . Hyperlipidemia    Past Surgical History  Procedure Laterality Date  . Tubal ligation    . Cesarean section    . Left heart catheterization with coronary angiogram N/A 03/08/2012    Procedure: LEFT HEART CATHETERIZATION WITH CORONARY ANGIOGRAM;  Surgeon: Sinclair Grooms, MD;  Location: [facility] CATH LAB;  Service: Cardiovascular;  Laterality: N/A;   . Lower extremity angiogram N/A 12/17/2012    Procedure: LOWER EXTREMITY ANGIOGRAM;  Surgeon: Laverda Page, MD;  Location: [facility] CATH LAB;  Service: Cardiovascular;  Laterality: N/A;  . Left heart catheterization with coronary angiogram N/A 12/17/2012    Procedure: LEFT HEART CATHETERIZATION WITH CORONARY ANGIOGRAM;  Surgeon: Laverda Page, MD;  Location: [facility] CATH LAB;  Service: Cardiovascular;  Laterality: N/A;   Family History  Problem Relation Age of Onset  . Diabetes Mother   . Diabetes Father   . Diabetes Sister   . Diabetes Brother   . Colon cancer Brother   . Esophageal cancer Neg Hx   . Rectal cancer Neg Hx   . Stomach cancer Neg Hx    History  Substance Use Topics  . Smoking status: Former Smoker    Quit date: 02/04/2011  . Smokeless tobacco: Never Used  . Alcohol Use: No   OB History    No data available     Review of Systems  Constitutional: Negative for fever, chills and fatigue.  HENT: Negative for trouble swallowing.   Eyes: Negative for visual disturbance.  Respiratory: Negative for shortness of breath.   Cardiovascular: Negative for chest pain and palpitations.  Gastrointestinal: Positive for abdominal pain. Negative for nausea, vomiting and diarrhea.  Genitourinary: Negative for dysuria and difficulty urinating.  Musculoskeletal: Negative for arthralgias and neck pain.  Skin: Negative for color change.  Neurological: Negative for dizziness and weakness.  Psychiatric/Behavioral: Negative for dysphoric mood.      Allergies  Insulins  Home Medications   Prior to Admission medications  Medication Sig Start Date End Date Taking? Authorizing Provider  Alogliptin-Pioglitazone 25-15 MG TABS Take 1 tablet by mouth daily.    Historical Provider, MD  aspirin EC 81 MG tablet Take 81 mg by mouth daily.    Historical Provider, MD  clopidogrel (PLAVIX) 75 MG tablet Take 75 mg by mouth every morning.     Historical Provider, MD  diphenhydrAMINE (BENADRYL) 25  MG tablet Take 1 tablet (25 mg total) by mouth every 6 (six) hours. 12/27/14   Jola Schmidt, MD  famotidine (PEPCID) 20 MG tablet Take 1 tablet (20 mg total) by mouth 2 (two) times daily. 12/27/14   Jola Schmidt, MD  furosemide (LASIX) 40 MG tablet Take 40 mg by mouth daily as needed for edema.     Historical Provider, MD  insulin aspart protamine- aspart (NOVOLOG MIX 70/30) (70-30) 100 UNIT/ML injection Inject 0.3 mLs (30 Units total) into the skin 2 (two) times daily with a meal. Patient not taking: Reported on 12/27/2014 03/02/14   Delfina Redwood, MD  levothyroxine (SYNTHROID) 88 MCG tablet Take 1 tablet (88 mcg total) by mouth daily before breakfast. 03/02/14   Delfina Redwood, MD  losartan (COZAAR) 100 MG tablet Take 100 mg by mouth daily.    Historical Provider, MD   BP 163/63 mmHg  Pulse 75  Temp(Src) 97.9 F (36.6 C) (Oral)  Resp 17  SpO2 100% Physical Exam  Constitutional: She is oriented to person, place, and time. She appears well-developed and well-nourished. No distress.  HENT:  Head: Normocephalic and atraumatic.  Eyes: Conjunctivae and EOM are normal.  Neck: Normal range of motion.  Cardiovascular: Normal rate and regular rhythm.  Exam reveals no gallop and no friction rub.   No murmur heard. Pulmonary/Chest: Effort normal and breath sounds normal. She has no wheezes. She has no rales. She exhibits no tenderness.  Abdominal: Soft. She exhibits no distension. There is tenderness. There is no rebound.  RLQ tenderness to palpation. No other focal tenderness or peritoneal signs.   Musculoskeletal: Normal range of motion.  Neurological: She is alert and oriented to person, place, and time. Coordination normal.  Speech is goal-oriented. Moves limbs without ataxia.   Skin: Skin is warm and dry.  Psychiatric: She has a normal mood and affect. Her behavior is normal.  Nursing note and vitals reviewed.   ED Course  Procedures (including critical care time) Labs Review Labs  Reviewed  COMPREHENSIVE METABOLIC PANEL - Abnormal; Notable for the following:    Chloride 100 (*)    Glucose, Bld 355 (*)    Creatinine, Ser 1.22 (*)    GFR calc non Af Amer 46 (*)    GFR calc Af Amer 53 (*)    All other components within normal limits  URINALYSIS, ROUTINE W REFLEX MICROSCOPIC - Abnormal; Notable for the following:    Glucose, UA >1000 (*)    All other components within normal limits  CBC WITH DIFFERENTIAL/PLATELET - Abnormal; Notable for the following:    Hemoglobin 11.7 (*)    HCT 35.7 (*)    All other components within normal limits  URINE MICROSCOPIC-ADD ON - Abnormal; Notable for the following:    Squamous Epithelial / LPF FEW (*)    All other components within normal limits  CBG MONITORING, ED - Abnormal; Notable for the following:    Glucose-Capillary 300 (*)    All other components within normal limits  CBG MONITORING, ED - Abnormal; Notable for the following:    Glucose-Capillary  222 (*)    All other components within normal limits    Imaging Review Ct Abdomen Pelvis W Contrast  04/07/2015   CLINICAL DATA:  Increased hyperglycemia over the last several days. Possible yeast infection. Right lower quadrant abdominal pain with constipation. Initial encounter.  EXAM: CT ABDOMEN AND PELVIS WITH CONTRAST  TECHNIQUE: Multidetector CT imaging of the abdomen and pelvis was performed using the standard protocol following bolus administration of intravenous contrast.  CONTRAST:  197mL OMNIPAQUE IOHEXOL 300 MG/ML  SOLN  COMPARISON:  Abdominal ultrasound 06/27/2007. Abdominal pelvic CT 06/24/2007.  FINDINGS: Lower chest: Clear lung bases. No significant pleural or pericardial effusion.  Hepatobiliary: There is diffuse mildly heterogeneous hepatic steatosis and mild hepatomegaly. No focal liver lesions identified. No evidence of gallstones, gallbladder wall thickening or biliary dilatation.  Pancreas: Unremarkable. No pancreatic ductal dilatation or surrounding inflammatory  changes.  Spleen: Normal in size without focal abnormality.  Adrenals/Urinary Tract: Both adrenal glands appear normal.The left kidney is mildly atrophy. There is cortical scarring in the upper poles of both kidneys. There is a small nonobstructing calculus in the upper pole of the right kidney which is similar to the prior study. No evidence of hydronephrosis, ureteral or bladder calculus. No bladder abnormality seen.  Stomach/Bowel: No evidence of bowel wall thickening, distention or surrounding inflammatory change.The appendix appears normal.  Vascular/Lymphatic: There are no enlarged abdominal or pelvic lymph nodes. Extensive atherosclerosis of the aorta, its branches and the iliac arteries.  Reproductive: The uterus and ovaries appear normal. No evidence of adnexal mass. Possible tubal ligation clip on the right, unchanged.  Other: Small umbilical hernia containing only fat. Previously demonstrated ascites has resolved.  Musculoskeletal: No acute or significant osseous findings. Stable lumbar spondylosis.  IMPRESSION: 1. No acute findings or explanation for the patient's symptoms. No evidence of appendicitis. 2. Cortical scarring in the upper poles of both kidneys, similar to prior examination. Nonobstructing calculus in the upper pole of the right kidney. 3. Heterogeneous hepatic steatosis. 4. Resolved ascites and bowel wall thickening. 5. Extensive atherosclerosis.   Electronically Signed   By: Richardean Sale M.D.   On: 04/07/2015 17:24     EKG Interpretation None      MDM   Final diagnoses:  Hyperglycemia  RLQ abdominal pain  Anemia, unspecified anemia type   2:39 PM Labs shoe hyperglycemia without other acute changes. Urinalysis pending. Vitals stable and patient afebrile.   Patient signed out to Doyce Loose, [place]. Patient pending CT abdomen pelvis.    Alvina Chou, PA-C 04/08/15 1148  Lacretia Leigh, MD 04/10/15 [phone]

## 2015-04-08 ENCOUNTER — Encounter: Payer: Medicare Other | Admitting: Medical

## 2015-05-07 ENCOUNTER — Encounter: Payer: Medicare Other | Admitting: Obstetrics & Gynecology

## 2015-05-21 ENCOUNTER — Emergency Department (INDEPENDENT_AMBULATORY_CARE_PROVIDER_SITE_OTHER)
Admission: EM | Admit: 2015-05-21 | Discharge: 2015-05-21 | Disposition: A | Discharge status: Other Acute Inpatient Hospital | Payer: Medicare Other | Source: Home / Self Care

## 2015-05-21 ENCOUNTER — Emergency Department (HOSPITAL_COMMUNITY)
Admission: EM | Admit: 2015-05-21 | Discharge: 2015-05-22 | Disposition: A | Discharge status: Home / Self Care | Payer: Medicare Other | Attending: Emergency Medicine | Admitting: Emergency Medicine

## 2015-05-21 ENCOUNTER — Encounter (HOSPITAL_COMMUNITY): Payer: Self-pay

## 2015-05-21 ENCOUNTER — Emergency Department (HOSPITAL_COMMUNITY): Payer: Medicare Other

## 2015-05-21 DIAGNOSIS — Z7982 Long term (current) use of aspirin: Secondary | ICD-10-CM | POA: Insufficient documentation

## 2015-05-21 DIAGNOSIS — E039 Hypothyroidism, unspecified: Secondary | ICD-10-CM | POA: Insufficient documentation

## 2015-05-21 DIAGNOSIS — N760 Acute vaginitis: Secondary | ICD-10-CM | POA: Insufficient documentation

## 2015-05-21 DIAGNOSIS — E119 Type 2 diabetes mellitus without complications: Secondary | ICD-10-CM | POA: Insufficient documentation

## 2015-05-21 DIAGNOSIS — R109 Unspecified abdominal pain: Secondary | ICD-10-CM | POA: Insufficient documentation

## 2015-05-21 DIAGNOSIS — Z7902 Long term (current) use of antithrombotics/antiplatelets: Secondary | ICD-10-CM | POA: Diagnosis not present

## 2015-05-21 DIAGNOSIS — R1031 Right lower quadrant pain: Secondary | ICD-10-CM | POA: Diagnosis not present

## 2015-05-21 DIAGNOSIS — M545 Low back pain: Secondary | ICD-10-CM | POA: Diagnosis not present

## 2015-05-21 DIAGNOSIS — Z8673 Personal history of transient ischemic attack (TIA), and cerebral infarction without residual deficits: Secondary | ICD-10-CM | POA: Insufficient documentation

## 2015-05-21 DIAGNOSIS — Z87891 Personal history of nicotine dependence: Secondary | ICD-10-CM | POA: Insufficient documentation

## 2015-05-21 DIAGNOSIS — R51 Headache: Secondary | ICD-10-CM | POA: Insufficient documentation

## 2015-05-21 DIAGNOSIS — E786 Lipoprotein deficiency: Secondary | ICD-10-CM | POA: Diagnosis not present

## 2015-05-21 DIAGNOSIS — I2510 Atherosclerotic heart disease of native coronary artery without angina pectoris: Secondary | ICD-10-CM | POA: Diagnosis not present

## 2015-05-21 DIAGNOSIS — K219 Gastro-esophageal reflux disease without esophagitis: Secondary | ICD-10-CM | POA: Diagnosis not present

## 2015-05-21 DIAGNOSIS — I10 Essential (primary) hypertension: Secondary | ICD-10-CM | POA: Diagnosis not present

## 2015-05-21 DIAGNOSIS — R1011 Right upper quadrant pain: Secondary | ICD-10-CM | POA: Diagnosis not present

## 2015-05-21 DIAGNOSIS — R350 Frequency of micturition: Secondary | ICD-10-CM | POA: Diagnosis not present

## 2015-05-21 DIAGNOSIS — D649 Anemia, unspecified: Secondary | ICD-10-CM | POA: Insufficient documentation

## 2015-05-21 DIAGNOSIS — B9689 Other specified bacterial agents as the cause of diseases classified elsewhere: Secondary | ICD-10-CM

## 2015-05-21 DIAGNOSIS — R519 Headache, unspecified: Secondary | ICD-10-CM

## 2015-05-21 DIAGNOSIS — E785 Hyperlipidemia, unspecified: Secondary | ICD-10-CM | POA: Insufficient documentation

## 2015-05-21 DIAGNOSIS — Z794 Long term (current) use of insulin: Secondary | ICD-10-CM | POA: Diagnosis not present

## 2015-05-21 DIAGNOSIS — R300 Dysuria: Secondary | ICD-10-CM | POA: Diagnosis not present

## 2015-05-21 LAB — POCT URINALYSIS DIP (DEVICE)
Bilirubin Urine: NEGATIVE
Glucose, UA: 500 mg/dL — AB
Hgb urine dipstick: NEGATIVE
KETONES UR: NEGATIVE mg/dL
Leukocytes, UA: NEGATIVE
Nitrite: NEGATIVE
PH: 5.5 (ref 5.0–8.0)
PROTEIN: NEGATIVE mg/dL
SPECIFIC GRAVITY, URINE: 1.02 (ref 1.005–1.030)
Urobilinogen, UA: 0.2 mg/dL (ref 0.0–1.0)

## 2015-05-21 LAB — URINALYSIS, ROUTINE W REFLEX MICROSCOPIC
BILIRUBIN URINE: NEGATIVE
Glucose, UA: >1000 mg/dL — AB
Hgb urine dipstick: NEGATIVE
KETONES UR: NEGATIVE mg/dL
Leukocytes, UA: NEGATIVE
NITRITE: NEGATIVE
Protein, ur: NEGATIVE mg/dL
Specific Gravity, Urine: 1.025 (ref 1.005–1.030)
Urobilinogen, UA: 0.2 mg/dL (ref 0.0–1.0)
pH: 5.5 (ref 5.0–8.0)

## 2015-05-21 LAB — URINE MICROSCOPIC-ADD ON

## 2015-05-21 LAB — COMPREHENSIVE METABOLIC PANEL
ALT: 18 U/L (ref 14–54)
AST: 25 U/L (ref 15–41)
Albumin: 3.7 g/dL (ref 3.5–5.0)
Alkaline Phosphatase: 76 U/L (ref 38–126)
Anion gap: 7 (ref 5–15)
BUN: 18 mg/dL (ref 6–20)
CALCIUM: 9.2 mg/dL (ref 8.9–10.3)
CO2: 27 mmol/L (ref 22–32)
Chloride: 102 mmol/L (ref 101–111)
Creatinine, Ser: 1.16 mg/dL — ABNORMAL HIGH (ref 0.44–1.00)
GFR, EST AFRICAN AMERICAN: 57 mL/min — AB (ref >60)
GFR, EST NON AFRICAN AMERICAN: 49 mL/min — AB (ref >60)
Glucose, Bld: 302 mg/dL — ABNORMAL HIGH (ref 65–99)
POTASSIUM: 4.2 mmol/L (ref 3.5–5.1)
SODIUM: 136 mmol/L (ref 135–145)
Total Bilirubin: 0.7 mg/dL (ref 0.3–1.2)
Total Protein: 7 g/dL (ref 6.5–8.1)

## 2015-05-21 LAB — CBC
HCT: 35.5 % — ABNORMAL LOW (ref 36.0–46.0)
Hemoglobin: 11.8 g/dL — ABNORMAL LOW (ref 12.0–15.0)
MCH: 29.7 pg (ref 26.0–34.0)
MCHC: 33.2 g/dL (ref 30.0–36.0)
MCV: 89.4 fL (ref 78.0–100.0)
PLATELETS: 198 10*3/uL (ref 150–400)
RBC: 3.97 MIL/uL (ref 3.87–5.11)
RDW: 14.7 % (ref 11.5–15.5)
WBC: 5.2 10*3/uL (ref 4.0–10.5)

## 2015-05-21 LAB — GLUCOSE, CAPILLARY: Glucose-Capillary: 323 mg/dL — ABNORMAL HIGH (ref 65–99)

## 2015-05-21 MED ORDER — DIPHENHYDRAMINE HCL 50 MG/ML IJ SOLN
12.5000 mg | Freq: Once | INTRAMUSCULAR | Status: AC
  Administered 2015-05-22: 12.5 mg via INTRAVENOUS
  Filled 2015-05-21: qty 1

## 2015-05-21 MED ORDER — METOCLOPRAMIDE HCL 5 MG/ML IJ SOLN
5.0000 mg | Freq: Once | INTRAMUSCULAR | Status: AC
  Administered 2015-05-22: 5 mg via INTRAVENOUS
  Filled 2015-05-21: qty 2

## 2015-05-21 MED ORDER — SODIUM CHLORIDE 0.9 % IV BOLUS (SEPSIS)
1000.0000 mL | INTRAVENOUS | Status: AC
  Administered 2015-05-22: 1000 mL via INTRAVENOUS

## 2015-05-21 NOTE — Discharge Instructions (Signed)
You need to have this pain evaluated in the emergency room.  We are sending you to the hospital to have further evaluation.

## 2015-05-21 NOTE — ED Notes (Signed)
Patient concerned about poss UTI, as she is c/o back pain

## 2015-05-21 NOTE — ED Provider Notes (Signed)
CSN: [id number]     Arrival date & time 05/21/15  2109 History   First MD Initiated Contact with Patient 05/21/15 2233     Chief Complaint  Patient presents with  . Headache  . Back Pain  . Urinary Frequency  . Dysuria     (Consider location/radiation/quality/duration/timing/severity/associated sxs/prior Treatment) Patient is a 64 y.o. female presenting with headaches, back pain, frequency, and dysuria. The history is provided by the patient.  Headache Pain location:  Frontal Quality:  Dull Radiates to:  Does not radiate Severity currently:  6/10 Severity at highest:  9/10 Onset quality:  Gradual Duration:  9 days Timing:  Constant Progression:  Waxing and waning Chronicity:  New Context comment:  Spontaneously Relieved by:  Acetaminophen Worsened by:  Nothing Ineffective treatments:  None tried Associated symptoms: abdominal pain   Associated symptoms: no back pain, no congestion, no cough, no diarrhea, no dizziness, no eye pain, no fatigue, no fever, no nausea, no neck pain and no vomiting   Abdominal pain:    Location:  R flank   Quality comment:  Nagging   Severity:  Mild   Onset quality:  Gradual   Duration:  2 months   Timing:  Constant   Progression:  Waxing and waning   Chronicity:  New Back Pain Associated symptoms: abdominal pain and headaches   Associated symptoms: no chest pain, no dysuria and no fever   Urinary Frequency Associated symptoms include abdominal pain and headaches. Pertinent negatives include no chest pain and no shortness of breath.  Dysuria Associated symptoms: abdominal pain   Associated symptoms: no fever, no nausea and no vomiting     Past Medical History  Diagnosis Date  . CVA (cerebral vascular accident) 01/2011    affected rt side  . Diabetes mellitus   . Hypertension   . Arteriosclerotic cardiovascular disease   . Hypothyroidism   . Hypocholesterolemia   . Renal insufficiency   . Anemia   . Vitamin D deficiency   .  Peripheral vascular disease   . GERD (gastroesophageal reflux disease)   . Claudication in peripheral vascular disease   . Hyperlipidemia    Past Surgical History  Procedure Laterality Date  . Tubal ligation    . Cesarean section    . Left heart catheterization with coronary angiogram N/A 03/08/2012    Procedure: LEFT HEART CATHETERIZATION WITH CORONARY ANGIOGRAM;  Surgeon: Sinclair Grooms, MD;  Location: [facility] CATH LAB;  Service: Cardiovascular;  Laterality: N/A;  . Lower extremity angiogram N/A 12/17/2012    Procedure: LOWER EXTREMITY ANGIOGRAM;  Surgeon: Laverda Page, MD;  Location: [facility] CATH LAB;  Service: Cardiovascular;  Laterality: N/A;  . Left heart catheterization with coronary angiogram N/A 12/17/2012    Procedure: LEFT HEART CATHETERIZATION WITH CORONARY ANGIOGRAM;  Surgeon: Laverda Page, MD;  Location: [facility] CATH LAB;  Service: Cardiovascular;  Laterality: N/A;   Family History  Problem Relation Age of Onset  . Diabetes Mother   . Diabetes Father   . Diabetes Sister   . Diabetes Brother   . Colon cancer Brother   . Esophageal cancer Neg Hx   . Rectal cancer Neg Hx   . Stomach cancer Neg Hx    History  Substance Use Topics  . Smoking status: Former Smoker    Quit date: 02/04/2011  . Smokeless tobacco: Never Used  . Alcohol Use: No   OB History    No data available     Review of Systems  Constitutional: Negative for fever and fatigue.  HENT: Negative for congestion and drooling.   Eyes: Negative for pain.  Respiratory: Negative for cough and shortness of breath.   Cardiovascular: Negative for chest pain.  Gastrointestinal: Positive for abdominal pain. Negative for nausea, vomiting and diarrhea.  Genitourinary: Positive for frequency. Negative for dysuria and hematuria.  Musculoskeletal: Negative for back pain, gait problem and neck pain.  Skin: Negative for color change.  Neurological: Positive for headaches. Negative for dizziness.  Hematological: Negative  for adenopathy.  Psychiatric/Behavioral: Negative for behavioral problems.  All other systems reviewed and are negative.     Allergies  Insulins  Home Medications   Prior to Admission medications   Medication Sig Start Date End Date Taking? Authorizing Provider  aspirin EC 81 MG tablet Take 81 mg by mouth daily.    Historical Provider, MD  cilostazol (PLETAL) 100 MG tablet Take 100 mg by mouth 2 (two) times daily.    Historical Provider, MD  clopidogrel (PLAVIX) 75 MG tablet Take 75 mg by mouth every morning.     Historical Provider, MD  diphenhydrAMINE (BENADRYL) 25 MG tablet Take 1 tablet (25 mg total) by mouth every 6 (six) hours. Patient not taking: Reported on 04/07/2015 12/27/14   Jola Schmidt, MD  famotidine (PEPCID) 20 MG tablet Take 1 tablet (20 mg total) by mouth 2 (two) times daily. Patient not taking: Reported on 04/07/2015 12/27/14   Jola Schmidt, MD  ferrous sulfate 325 (65 FE) MG tablet Take 1 tablet (325 mg total) by mouth daily. 04/07/15   Al Corpus, PA-C  furosemide (LASIX) 40 MG tablet Take 40 mg by mouth daily as needed for edema.     Historical Provider, MD  glimepiride (AMARYL) 4 MG tablet Take 4 mg by mouth daily with breakfast.    Historical Provider, MD  insulin aspart protamine- aspart (NOVOLOG MIX 70/30) (70-30) 100 UNIT/ML injection Inject 0.3 mLs (30 Units total) into the skin 2 (two) times daily with a meal. 03/02/14   Delfina Redwood, MD  levothyroxine (SYNTHROID) 88 MCG tablet Take 1 tablet (88 mcg total) by mouth daily before breakfast. 03/02/14   Delfina Redwood, MD  losartan (COZAAR) 100 MG tablet Take 100 mg by mouth daily.    Historical Provider, MD  ranitidine (ZANTAC) 150 MG capsule Take 150 mg by mouth daily as needed for heartburn.    Historical Provider, MD   BP 162/66 mmHg  Pulse 64  Temp(Src) 97.7 F (36.5 C) (Oral)  Resp 18  Ht 5\' 2"  (1.575 m)  Wt 182 lb 14.4 oz (82.963 kg)  BMI 33.44 kg/m2  SpO2 95% Physical Exam   Constitutional: She is oriented to person, place, and time. She appears well-developed and well-nourished.  HENT:  Head: Normocephalic.  Mouth/Throat: Oropharynx is clear and moist. No oropharyngeal exudate.  Eyes: Conjunctivae and EOM are normal. Pupils are equal, round, and reactive to light.  Neck: Normal range of motion. Neck supple.  Cardiovascular: Normal rate, regular rhythm, normal heart sounds and intact distal pulses.  Exam reveals no gallop and no friction rub.   No murmur heard. Pulmonary/Chest: Effort normal and breath sounds normal. No respiratory distress. She has no wheezes.  Abdominal: Soft. Bowel sounds are normal. There is tenderness (mild nonspecific right-sided abdominal ttp). There is no rebound and no guarding.  Genitourinary:  Normal-appearing external vagina.  Normal-appearing cervix. Os closed. Small amount of white milky fluid the posterior fornix.  No cervical motion tenderness. Mild right adnexal tenderness.  No other tenderness during the bimanual exam.  Musculoskeletal: Normal range of motion. She exhibits no edema or tenderness.  Neurological: She is alert and oriented to person, place, and time.  alert, oriented x3 speech: normal in context and clarity memory: intact grossly cranial nerves II-XII: intact motor strength: full proximally and distally no involuntary movements or tremors sensation: intact to light touch diffusely  cerebellar: finger-to-nose and heel-to-shin intact gait: normal   Skin: Skin is warm and dry.  Psychiatric: She has a normal mood and affect. Her behavior is normal.  Nursing note and vitals reviewed.   ED Course  Procedures (including critical care time) Labs Review Labs Reviewed  WET PREP, GENITAL - Abnormal; Notable for the following:    Clue Cells Wet Prep HPF POC MODERATE (*)    WBC, Wet Prep HPF POC FEW (*)    All other components within normal limits  URINALYSIS, ROUTINE W REFLEX MICROSCOPIC (NOT AT [facility]) -  Abnormal; Notable for the following:    Glucose, UA >1000 (*)    All other components within normal limits  COMPREHENSIVE METABOLIC PANEL - Abnormal; Notable for the following:    Glucose, Bld 302 (*)    Creatinine, Ser 1.16 (*)    GFR calc non Af Amer 49 (*)    GFR calc Af Amer 57 (*)    All other components within normal limits  CBC - Abnormal; Notable for the following:    Hemoglobin 11.8 (*)    HCT 35.5 (*)    All other components within normal limits  URINE MICROSCOPIC-ADD ON  GC/CHLAMYDIA PROBE AMP ([facility]) NOT AT [facility]    Imaging Review Ct Head Wo Contrast  05/21/2015   CLINICAL DATA:  Intermittent RIGHT-sided headache for 1 week with dizziness. History of stroke, diabetes, hypertension, renal insufficiency.  EXAM: CT HEAD WITHOUT CONTRAST  TECHNIQUE: Contiguous axial images were obtained from the base of the skull through the vertex without intravenous contrast.  COMPARISON:  CT head September 04, 2014  FINDINGS: The ventricles and sulci are normal for age. No intraparenchymal hemorrhage, mass effect nor midline shift. Patchy supratentorial white matter hypodensities are advanced for age and though non-specific suggest sequelae of chronic small vessel ischemic disease. No acute large vascular territory infarcts. Old LEFT caudate head lacunar infarct.  No abnormal extra-axial fluid collections. Basal cisterns are patent. Moderate calcific atherosclerosis of the carotid siphons.  No skull fracture. The included ocular globes and orbital contents are non-suspicious. The mastoid aircells and included paranasal sinuses are well-aerated.  IMPRESSION: No acute intracranial process.  Chronic changes, including mild to moderate chronic small vessel ischemic disease, advanced for age and old LEFT basal ganglia lacunar infarct.   Electronically Signed   By: Elon Alas M.D.   On: 05/21/2015 23:57     EKG Interpretation None      MDM   Final diagnoses:  Headache  BV (bacterial  vaginosis)  Right flank pain    11:19 PM 64 y.o. female w hx of CVA, Dm, HTN, renal insuff who presents with multiple complaints. She states that she has had gradual onset, waxing waning, right frontal headache which began 9 days ago. She currently rates it as 6 out of 10. She denies any fevers or head injuries. She does have night sweats. She has had right-sided abdominal pain for 2 months. She was seen here at the end of May and had a noncontributory CT scan of her abdomen. She presented to urgent care today for these  complaints. She was also having some urinary frequency. Due to her abdominal tenderness she was seen here for evaluation. Her vital signs are unremarkable here. She does have some mild right-sided abdominal pain which the patient states is exactly the same as it was at the end of May when she had the CT scan. I feel like it is unlikely that a repeat CT scan will show anything given her chronic symptoms. She agrees and does not want the CT. I did fill up her into getting a CT scan of her head given her new onset headache and being on Plavix. She is okay with this. Will also perform pelvic exam as this was not done previously on her other workup. She denies any vaginal discharge.  1:12 AM: will tx for BV. HA improved. CT neg.  I have discussed the diagnosis/risks/treatment options with the patient and believe the pt to be eligible for discharge home to follow-up with her pcp for her chronic abd pain. Abd remains benign. Pt refused pain meds for home. We also discussed returning to the ED immediately if new or worsening sx occur. We discussed the sx which are most concerning (e.g., worsening pain, fever, vomiting) that necessitate immediate return. Medications administered to the patient during their visit and any new prescriptions provided to the patient are listed below.  Medications given during this visit Medications  sodium chloride 0.9 % bolus 1,000 mL (1,000 mLs Intravenous New  Bag/Given 05/22/15 0015)  metoCLOPramide (REGLAN) injection 5 mg (5 mg Intravenous Given 05/22/15 0011)  diphenhydrAMINE (BENADRYL) injection 12.5 mg (12.5 mg Intravenous Given 05/22/15 0011)    New Prescriptions   METRONIDAZOLE (FLAGYL) 500 MG TABLET    Take 1 tablet (500 mg total) by mouth 2 (two) times daily. One po bid x 7 days     Pamella Pert, MD 05/22/15 [phone]

## 2015-05-21 NOTE — ED Notes (Signed)
Pt reports it burned to pee 1 time 2 weeks ago, sts she drinks water and has to urinate, sts burning up hot from shoulder to waste reprots headache, and back pain.

## 2015-05-21 NOTE — ED Provider Notes (Signed)
CSN: [id number]     Arrival date & time 05/21/15  1950 History   First MD Initiated Contact with Patient 05/21/15 2038     Chief Complaint  Patient presents with  . Back Pain   (Consider location/radiation/quality/duration/timing/severity/associated sxs/prior Treatment) Patient is a 64 y.o. female presenting with back pain and abdominal pain. The history is provided by the patient.  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  Past Medical History  Diagnosis Date  . CVA (cerebral vascular accident) 01/2011    affected rt side  . Diabetes mellitus   . Hypertension   . Arteriosclerotic cardiovascular disease   . Hypothyroidism   . Hypocholesterolemia   . Renal insufficiency   . Anemia   . Vitamin D deficiency   . Peripheral vascular disease   . GERD (gastroesophageal reflux disease)   . Claudication in peripheral vascular disease   . Hyperlipidemia    Past Surgical History  Procedure Laterality Date  . Tubal ligation    . Cesarean section    . Left heart catheterization with coronary angiogram N/A 03/08/2012    Procedure: LEFT HEART CATHETERIZATION WITH CORONARY ANGIOGRAM;  Surgeon: Sinclair Grooms, MD;  Location: [facility] CATH LAB;  Service: Cardiovascular;  Laterality: N/A;  . Lower extremity angiogram N/A 12/17/2012    Procedure: LOWER EXTREMITY ANGIOGRAM;  Surgeon: Laverda Page, MD;  Location: [facility] CATH LAB;  Service: Cardiovascular;  Laterality: N/A;  . Left heart catheterization with coronary angiogram N/A 12/17/2012     Procedure: LEFT HEART CATHETERIZATION WITH CORONARY ANGIOGRAM;  Surgeon: Laverda Page, MD;  Location: [facility] CATH LAB;  Service: Cardiovascular;  Laterality: N/A;   Family History  Problem Relation Age of Onset  . Diabetes Mother   . Diabetes Father   . Diabetes Sister   . Diabetes Brother   . Colon cancer Brother   . Esophageal cancer Neg Hx   . Rectal cancer Neg Hx   . Stomach cancer Neg Hx    History  Substance Use Topics  . Smoking status: Former Smoker    Quit date: 02/04/2011  . Smokeless tobacco: Never Used  . Alcohol Use: No   OB History    No data available     Review of Systems  Constitutional: Positive for chills, diaphoresis and fatigue.  Eyes: Negative.   Respiratory: Negative.   Cardiovascular: Negative.   Gastrointestinal: Positive for abdominal pain.  Genitourinary: Positive for frequency.  Musculoskeletal: Positive for back pain.  Neurological: Negative.   Psychiatric/Behavioral: Negative.     Allergies  Insulins  Home Medications   Prior to Admission medications   Medication Sig Start Date End Date Taking? Authorizing Provider  aspirin EC 81 MG tablet Take 81 mg by mouth daily.    Historical Provider, MD  cilostazol (PLETAL) 100 MG tablet Take 100 mg by mouth 2 (two) times daily.    Historical Provider, MD  clopidogrel (PLAVIX) 75 MG tablet Take 75 mg by mouth every morning.     Historical Provider, MD  diphenhydrAMINE (BENADRYL) 25 MG tablet  Take 1 tablet (25 mg total) by mouth every 6 (six) hours. Patient not taking: Reported on 04/07/2015 12/27/14   Jola Schmidt, MD  famotidine (PEPCID) 20 MG tablet Take 1 tablet (20 mg total) by mouth 2 (two) times daily. Patient not taking: Reported on 04/07/2015 12/27/14   Jola Schmidt, MD  ferrous sulfate 325 (65 FE) MG tablet Take 1 tablet (325 mg total) by mouth daily. 04/07/15   Al Corpus, PA-C  furosemide (LASIX) 40 MG tablet Take 40 mg by mouth daily as needed for edema.     Historical Provider, MD   glimepiride (AMARYL) 4 MG tablet Take 4 mg by mouth daily with breakfast.    Historical Provider, MD  insulin aspart protamine- aspart (NOVOLOG MIX 70/30) (70-30) 100 UNIT/ML injection Inject 0.3 mLs (30 Units total) into the skin 2 (two) times daily with a meal. 03/02/14   Delfina Redwood, MD  levothyroxine (SYNTHROID) 88 MCG tablet Take 1 tablet (88 mcg total) by mouth daily before breakfast. 03/02/14   Delfina Redwood, MD  losartan (COZAAR) 100 MG tablet Take 100 mg by mouth daily.    Historical Provider, MD  ranitidine (ZANTAC) 150 MG capsule Take 150 mg by mouth daily as needed for heartburn.    Historical Provider, MD   BP 101/71 mmHg  Pulse 73  Temp(Src) 98.5 F (36.9 C) (Oral)  Resp 16  SpO2 95% Physical Exam  Constitutional: She is oriented to person, place, and time. She appears well-developed and well-nourished.  HENT:  Head: Normocephalic and atraumatic.  Right Ear: External ear normal.  Left Ear: External ear normal.  Mouth/Throat: Oropharynx is clear and moist.  Eyes: Conjunctivae are normal. Pupils are equal, round, and reactive to light.  Neck: Normal range of motion. Neck supple. No thyromegaly present.  Cardiovascular: Normal rate, regular rhythm and normal heart sounds.   Pulmonary/Chest: Effort normal and breath sounds normal.  Abdominal: Soft. There is tenderness. There is guarding.  Tender RUQ and RLQ  Musculoskeletal: She exhibits no edema.  Neurological: She is alert and oriented to person, place, and time.  Skin: Skin is warm and dry.  Psychiatric: She has a normal mood and affect. Her behavior is normal.  Nursing note and vitals reviewed.   ED Course  Procedures (including critical care time) Labs Review Labs Reviewed  POCT URINALYSIS DIP (DEVICE) - Abnormal; Notable for the following:    Glucose, UA 500 (*)    All other components within normal limits   Blood sugar 323  Imaging Review No results found.   MDM  Patient has normal urine  with exception of glucose.  Her abdomen is quite tender and the symptoms are persistent for several weeks.  She has urinary frequency suggesting uncontrolled diabetes.  I am concerned that she is so tender and the pain has been making sleep difficult for 3 weeks.  It's possible the uncontrolled diabetes is contributing to her pain, yet I suspect there is an intraabdominal process going on.  Robyn Haber, MD   Robyn Haber, MD 05/21/15 [phone]

## 2015-05-22 DIAGNOSIS — R51 Headache: Secondary | ICD-10-CM | POA: Diagnosis not present

## 2015-05-22 LAB — WET PREP, GENITAL
Trich, Wet Prep: NONE SEEN
Yeast Wet Prep HPF POC: NONE SEEN

## 2015-05-22 MED ORDER — METRONIDAZOLE 500 MG PO TABS: 500.0000 mg | ORAL_TABLET | Freq: Two times a day (BID) | ORAL | Status: DC

## 2015-05-24 LAB — GC/CHLAMYDIA PROBE AMP (~~LOC~~) NOT AT ARMC
CHLAMYDIA, DNA PROBE: NEGATIVE
Neisseria Gonorrhea: NEGATIVE

## 2015-05-25 ENCOUNTER — Encounter: Payer: Medicare Other | Admitting: Dietician

## 2015-05-31 ENCOUNTER — Telehealth: Payer: Self-pay | Admitting: Internal Medicine

## 2015-05-31 NOTE — Telephone Encounter (Signed)
Call to patient to confirm appointment for 06/01/15 at 3:15 lmtcb

## 2015-06-01 ENCOUNTER — Ambulatory Visit (INDEPENDENT_AMBULATORY_CARE_PROVIDER_SITE_OTHER): Payer: Medicare Other | Admitting: Internal Medicine

## 2015-06-01 ENCOUNTER — Encounter: Payer: Self-pay | Admitting: Internal Medicine

## 2015-06-01 VITALS — BP 158/62 | HR 64 | Temp 98.2°F | Ht 62.0 in | Wt 179.6 lb

## 2015-06-01 DIAGNOSIS — B9689 Other specified bacterial agents as the cause of diseases classified elsewhere: Secondary | ICD-10-CM | POA: Insufficient documentation

## 2015-06-01 DIAGNOSIS — E1165 Type 2 diabetes mellitus with hyperglycemia: Secondary | ICD-10-CM | POA: Diagnosis not present

## 2015-06-01 DIAGNOSIS — Z794 Long term (current) use of insulin: Secondary | ICD-10-CM | POA: Diagnosis not present

## 2015-06-01 DIAGNOSIS — E039 Hypothyroidism, unspecified: Secondary | ICD-10-CM

## 2015-06-01 DIAGNOSIS — D649 Anemia, unspecified: Secondary | ICD-10-CM

## 2015-06-01 DIAGNOSIS — E559 Vitamin D deficiency, unspecified: Secondary | ICD-10-CM

## 2015-06-01 DIAGNOSIS — I129 Hypertensive chronic kidney disease with stage 1 through stage 4 chronic kidney disease, or unspecified chronic kidney disease: Secondary | ICD-10-CM | POA: Diagnosis not present

## 2015-06-01 DIAGNOSIS — E038 Other specified hypothyroidism: Secondary | ICD-10-CM

## 2015-06-01 DIAGNOSIS — N760 Acute vaginitis: Secondary | ICD-10-CM

## 2015-06-01 DIAGNOSIS — N183 Chronic kidney disease, stage 3 unspecified: Secondary | ICD-10-CM

## 2015-06-01 DIAGNOSIS — E1122 Type 2 diabetes mellitus with diabetic chronic kidney disease: Secondary | ICD-10-CM | POA: Diagnosis not present

## 2015-06-01 DIAGNOSIS — I5189 Other ill-defined heart diseases: Secondary | ICD-10-CM | POA: Insufficient documentation

## 2015-06-01 DIAGNOSIS — E669 Obesity, unspecified: Secondary | ICD-10-CM

## 2015-06-01 DIAGNOSIS — Z6832 Body mass index (BMI) 32.0-32.9, adult: Secondary | ICD-10-CM

## 2015-06-01 DIAGNOSIS — K219 Gastro-esophageal reflux disease without esophagitis: Secondary | ICD-10-CM

## 2015-06-01 DIAGNOSIS — Z8673 Personal history of transient ischemic attack (TIA), and cerebral infarction without residual deficits: Secondary | ICD-10-CM

## 2015-06-01 DIAGNOSIS — I10 Essential (primary) hypertension: Secondary | ICD-10-CM

## 2015-06-01 DIAGNOSIS — E785 Hyperlipidemia, unspecified: Secondary | ICD-10-CM

## 2015-06-01 DIAGNOSIS — Z0000 Encounter for general adult medical examination without abnormal findings: Secondary | ICD-10-CM

## 2015-06-01 LAB — GLUCOSE, CAPILLARY
Glucose-Capillary: 209 mg/dL — ABNORMAL HIGH (ref 65–99)
Glucose-Capillary: 199 mg/dL — ABNORMAL HIGH (ref 65–99)

## 2015-06-01 LAB — POCT GLYCOSYLATED HEMOGLOBIN (HGB A1C): HEMOGLOBIN A1C: 11.7

## 2015-06-01 LAB — HM DIABETES EYE EXAM

## 2015-06-01 MED ORDER — GLIPIZIDE ER 5 MG PO TB24: 5.0000 mg | ORAL_TABLET | Freq: Every day | ORAL | Status: DC

## 2015-06-01 MED ORDER — INSULIN ASPART PROT & ASPART (70-30 MIX) 100 UNIT/ML ~~LOC~~ SUSP: 36.0000 [IU] | Freq: Two times a day (BID) | SUBCUTANEOUS | Status: DC

## 2015-06-01 MED ORDER — GLIPIZIDE 5 MG PO TABS: 5.0000 mg | ORAL_TABLET | Freq: Every day | ORAL | Status: DC

## 2015-06-01 MED ORDER — HYDROCHLOROTHIAZIDE 25 MG PO TABS: 25.0000 mg | ORAL_TABLET | Freq: Every day | ORAL | Status: DC

## 2015-06-01 NOTE — Assessment & Plan Note (Addendum)
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    Plan:  -Last CBC on 05/21/15 with Hg 11.8 at baseline  -Obtain anemia panel, if ferritin <100 and Tstat <20% consider iron repletion with ferrous sulfate -Refer to GI at next visit for discussion regarding screening colonoscopy since on dual AP therapy for history of CVA (reportably had cologuard testing in 2014?)

## 2015-06-01 NOTE — Assessment & Plan Note (Addendum)
Assessment: Pt with unknown last vitamin D level not on supplementation who presents with no recent fall or fracture.   Plan:  -Obtain 25-OH vitamin D level    ADDENDUM on 06/03/15: Pt with vitamin D level of 15. Start ergocalciferol 50K U weekly for 8 weeks with repeat levels after completion of therapy.

## 2015-06-01 NOTE — Progress Notes (Signed)
Internal Medicine Clinic Attending  Case discussed with Dr. Rabbani at the time of the visit.  We reviewed the resident's history and exam and pertinent patient test results.  I agree with the assessment, diagnosis, and plan of care documented in the resident's note.  

## 2015-06-01 NOTE — Assessment & Plan Note (Addendum)
-  Obtain screening HIV Ab and HCV Ab (born b[birth year]w [birth year]) -Pt declined tdap vaccination at this time, inquire again at next visit  -Defer zoster vaccination until age 64 yo due to insurance coverage -Pt reports having pneumococcal vaccination in the past  -Pt instructed to schedule appointment for pap smear testing -Refer to GI for discussion of colonoscopy at next visit

## 2015-06-01 NOTE — Progress Notes (Signed)
Patient ID: [name], female   DOB: 1951-05-28, 64 y.o.   MRN: [id number]     Subjective:   Patient ID: [name] female   DOB: [birth year] 64 y.o.   MRN: [id number]  HPI: [name] is a 64 y.o. woman with past medical history of insulin-dependent Type 2 DM, hypertension, hyperlipidemia, CKD Stage 3, hypothyroidism on replacement, vitamin D deficiency, PVD on pletal, CVA on ASA & plavix, grade 1 diastolic dysfunction, normocytic anemia, GERD, and past tobacco use who presents as a new patient to establish care.   Her last A1c was 10/3 on 03/01/14. She checks her blood sugar twice daily and has brought her glucose meter which reveals blood sugar range of 191-442 with average of 302. She is no longer taking amaryl and was previously on metformin but could not tolerate it. She is compliant with taking Novolog mix 30 U BID (takes before breakfast and dinner). She denies symptomatic hypoglycemia. She has chronic polyuria, polyphagia, and peripheral neuropathy but denies blurry vision, polydipsia, or recent foot injury/ulcer. She has not had recent eye exam. She tries to follow a healthy diet and exercise however due to PVD has LE cramping with activity. Her weight has been stable.   She is compliant with taking losartan 100 mg daily for hypertension. She is not taking lasix which is prescribed as needed for LE edema. She has occasional headache and lightheadedness but denies chest pain and LE edema.    She is not currently on statin therapy and reports intolerance to lipitor and pravastatin in the past. She was prescribed omega-3 fatty acid but is no longer taking it.   She is compliant with taking synthroid 88 mcg daily for hypothyroidism. She reports feeling warm without other symptoms of thyroid dysfunction. Her last TSH on 03/01/14 was low at 0.017.   She has history of normocytic anemia and used to be on iron but no longer taking it due to constipation. She had colonoscopy in 2004 by  Dr Penelope Coop which was normal with the exception of a prominent ileocecal valve which was benign. She was evaluated by GI on 09/18/13 which they were deciding on virtual colonoscopy vs cologuard which it seems that per her report cologuard was done and negative. EGD in 2004 showed mild gastritis and takes zantac as needed for GERD. She denies family history of colon cancer. She is postmenopausal. She denies GI or GU bleeding. She is on aspirin and plavix status post CVA. She takes pletal as needed for muscle cramps for PVD.       Past Medical History  Diagnosis Date  . CVA (cerebral vascular accident) 01/2011    affected rt side  . Diabetes mellitus   . Hypertension   . Arteriosclerotic cardiovascular disease   . Hypothyroidism   . Hypocholesterolemia   . Renal insufficiency   . Anemia   . Vitamin D deficiency   . Peripheral vascular disease   . GERD (gastroesophageal reflux disease)   . Claudication in peripheral vascular disease   . Hyperlipidemia    Current Outpatient Prescriptions  Medication Sig Dispense Refill  . aspirin EC 81 MG tablet Take 81 mg by mouth daily.    . cilostazol (PLETAL) 100 MG tablet Take 100 mg by mouth 2 (two) times daily.    . clopidogrel (PLAVIX) 75 MG tablet Take 75 mg by mouth every morning.     . diphenhydrAMINE (BENADRYL) 25 MG tablet Take 1 tablet (25 mg total) by mouth every  6 (six) hours. (Patient not taking: Reported on 04/07/2015) 12 tablet 0  . famotidine (PEPCID) 20 MG tablet Take 1 tablet (20 mg total) by mouth 2 (two) times daily. (Patient not taking: Reported on 04/07/2015) 12 tablet 0  . ferrous sulfate 325 (65 FE) MG tablet Take 1 tablet (325 mg total) by mouth daily. 30 tablet 0  . furosemide (LASIX) 40 MG tablet Take 40 mg by mouth daily as needed for edema.     Marland Kitchen glimepiride (AMARYL) 4 MG tablet Take 4 mg by mouth daily with breakfast.    . insulin aspart protamine- aspart (NOVOLOG MIX 70/30) (70-30) 100 UNIT/ML injection Inject 0.3 mLs (30 Units  total) into the skin 2 (two) times daily with a meal. 10 mL 11  . levothyroxine (SYNTHROID) 88 MCG tablet Take 1 tablet (88 mcg total) by mouth daily before breakfast. 30 tablet 2  . losartan (COZAAR) 100 MG tablet Take 100 mg by mouth daily.    . metroNIDAZOLE (FLAGYL) 500 MG tablet Take 1 tablet (500 mg total) by mouth 2 (two) times daily. One po bid x 7 days 14 tablet 0  . ranitidine (ZANTAC) 150 MG capsule Take 150 mg by mouth daily as needed for heartburn.     No current facility-administered medications for this visit.   Family History  Problem Relation Age of Onset  . Diabetes Mother   . Diabetes Father   . Diabetes Sister   . Diabetes Brother   . Colon cancer Brother   . Esophageal cancer Neg Hx   . Rectal cancer Neg Hx   . Stomach cancer Neg Hx    History   Social History  . Marital Status: Single    Spouse Name: N/A  . Number of Children: 1  . Years of Education: N/A   Occupational History  . Retired    Social History Main Topics  . Smoking status: Former Smoker    Quit date: 02/04/2011  . Smokeless tobacco: Never Used  . Alcohol Use: No  . Drug Use: No  . Sexual Activity: Not on file   Other Topics Concern  . Not on file   Social History Narrative   Review of Systems: Review of Systems  Constitutional: Negative for fever, chills and weight loss.       Feeling warm  HENT: Negative for congestion, ear pain and sore throat.   Eyes: Negative for blurred vision.  Respiratory: Negative for cough, shortness of breath and wheezing.   Cardiovascular: Positive for claudication. Negative for chest pain, palpitations, orthopnea and leg swelling.  Gastrointestinal: Positive for heartburn (occasionally heartburn) and constipation. Negative for nausea, vomiting, abdominal pain, diarrhea, blood in stool and melena.  Genitourinary: Negative for dysuria, urgency, frequency and hematuria.       Recent BV infection  Musculoskeletal: Positive for myalgias and back pain  (low). Negative for joint pain and falls.  Neurological: Positive for dizziness (lightheadedness ), sensory change (chronic peripheral neuropathy) and headaches (occasianally).  Endo/Heme/Allergies: Negative for environmental allergies and polydipsia. Does not bruise/bleed easily.  Psychiatric/Behavioral: Negative for substance abuse. The patient is not nervous/anxious.     Objective:  Physical Exam: Filed Vitals:   06/01/15 1503  BP: 158/62  Pulse: 64  Temp: 98.2 F (36.8 C)  TempSrc: Oral  Height: 5\' 2"  (1.575 m)  Weight: 179 lb 9.6 oz (81.466 kg)  SpO2: 99%    Physical Exam  Constitutional: She is oriented to person, place, and time. She appears well-developed and well-nourished.  No distress.  HENT:  Head: Normocephalic and atraumatic.  Right Ear: External ear normal.  Nose: Nose normal.  Mouth/Throat: Oropharynx is clear and moist.  Eyes: Conjunctivae and EOM are normal. Pupils are equal, round, and reactive to light. Right eye exhibits no discharge. Left eye exhibits no discharge. No scleral icterus.  Neck: Normal range of motion. Neck supple. No thyromegaly present.  Cardiovascular: Normal rate, regular rhythm and normal heart sounds.   No murmur heard. Pulmonary/Chest: Effort normal and breath sounds normal. No respiratory distress. She has no wheezes. She has no rales.  Abdominal: Soft. Bowel sounds are normal. She exhibits no distension. There is no tenderness. There is no rebound and no guarding.  Musculoskeletal: Normal range of motion. She exhibits edema (trace b/l LE). She exhibits no tenderness.  Neurological: She is alert and oriented to person, place, and time.  Normal 5/5 muscle strength and normal sensation to light touch of extremities.   Skin: Skin is warm and dry. No rash noted. She is not diaphoretic. No erythema. No pallor.  Psychiatric: She has a normal mood and affect. Her behavior is normal. Judgment and thought content normal.    Assessment & Plan:     Please see problem list for problem-based assessment and plan

## 2015-06-01 NOTE — Patient Instructions (Addendum)
-  Your A1c is 11.7 which is more than goal of less than 7. Please increase your Novolog mix from 30 U twice a day to 36 U twice day and start taking glipizide 5 mg daily   -Start taking HCTZ 25 mg daily for high blood pressure in addition to losartan 100 mg daily, don't take lasix anymore -We may need to start you on cholesterol medication if your cholesterol is still high  -Will refer you to podiatry for foot care  -Will check your eyes and bloodwork today and call you with the results -Very nice meeting you, please come back in 2 weeks for your diabetes and make separate appointment for a pap smear    General Instructions:   Thank you for bringing your medicines today. This helps [place] keep you safe from mistakes.   Progress Toward Treatment Goals:  No flowsheet data found.  Self Care Goals & Plans:  No flowsheet data found.  No flowsheet data found.   Care Management & Community Referrals:  No flowsheet data found.

## 2015-06-01 NOTE — Assessment & Plan Note (Addendum)
Assessment: Pt with CKD Stage 3 with last Cr 1.16 and GFR 57 on 05/21/15 who presents with normal urine output.   Plan:  -Obtain PTH, phosphorous, 25-OH vitamin D, and anemia panel  -Avoid nephrotoxins -Obtain annual microalbumin urine test -Continue losartan 100 mg daily

## 2015-06-01 NOTE — Assessment & Plan Note (Addendum)
Assessment: Pt with last lipid panel on 03/05/12 with LDL 143 not on statin therapy with calculated 10-yr ASCVD risk of >7.5% with recommendations to start moderate to high intensity statin therapy.   Plan:  -Obtain annual lipid panel  -Pt declines starting statin therapy at this time due to intolerance to various ones in the past  -Consider starting zetia at next visit if pt agreeable   ADDENDUM on 06/03/15: Pt with fasting triglyceride level of 585 with normal LDL level of 56, start gemfibrozil 600 mg BID.

## 2015-06-01 NOTE — Addendum Note (Signed)
Addended byJuluis Mire on: 06/01/2015 07:47 PM   Modules accepted: Level of Service

## 2015-06-01 NOTE — Assessment & Plan Note (Signed)
Assessment: Pt with moderately well-controlled hypertension compliant with one-class (ARB) anti-hypertensive therapy who presents with blood pressure of 158/62.   Plan:  -BP 158/62 not at goal <140/90 -Prescribe HCTZ 25 mg daily  -Continue losartan 100 mg daily  -Last CMP on 05/21/15 with stable CKD Stage 3 -Pt to return in 2 weeks for blood pressure recheck

## 2015-06-01 NOTE — Assessment & Plan Note (Addendum)
Assessment: Pt with last A1c of 10.3 on 03/01/14 compliant with insulin therapy with no recent symptomatic hypoglycemia who presents with CBG of 209 and worsened A1c of 11.7    Plan:  -A1c 11.7 not at goal <7, Discontinue amaryl as pt no longer taking. Pt with intolerance to metformin in the past. Start glipizide ER 5 mg daily and increase Novolog (70/30) mix from 30 U BID to 36 U BID. Pt to return in 2 weeks for further insulin adjustment.  -BP 158/62 not at goal <140/90, continue losartan 100 mg daily and start HCTZ 25 mg daily  -Obtain annual lipid panel, last LDL 143 not at goal <100, pt reports statin intolerance in past, consider starting zetia at next visit  -Obtain annual urine microalbumin test today -Perform annual foot and eye exams today -Refer to podiatry for diabetic foot care -BMI 32.84 not at goal <25, encourage weight loss, refer to nutrition and diabetes services for nutrition counseling

## 2015-06-01 NOTE — Assessment & Plan Note (Signed)
Assessment: Pt with last TSH level low at 0.017 on 03/01/14 compliant with replacement therapy who presents with mild symptoms of thyroid dysfunction.   Plan:  -Obtain TSH and adjust synthroid 88 mcg daily based on level

## 2015-06-01 NOTE — Assessment & Plan Note (Signed)
Assessment: Pt with well-controlled GERD on H2-blocking therapy as needed who presents with no alarm symptoms.   Plan:  -Continue ranitidine 150 mg daily PRN  -Monitor for alarm symptoms warranting EGD

## 2015-06-02 ENCOUNTER — Other Ambulatory Visit: Payer: Self-pay | Admitting: Dietician

## 2015-06-02 DIAGNOSIS — N183 Chronic kidney disease, stage 3 unspecified: Secondary | ICD-10-CM

## 2015-06-02 DIAGNOSIS — E1122 Type 2 diabetes mellitus with diabetic chronic kidney disease: Secondary | ICD-10-CM

## 2015-06-02 LAB — ANEMIA PANEL
%SAT: 26 % (ref 20–55)
ABS Retic: 74.9 10*3/uL (ref 19.0–186.0)
FERRITIN: 257 ng/mL (ref 10–291)
Folate: 15.9 ng/mL
IRON: 81 ug/dL (ref 42–145)
RBC.: 4.16 MIL/uL (ref 3.87–5.11)
Retic Ct Pct: 1.8 % (ref 0.4–2.3)
TIBC: 313 ug/dL (ref 250–470)
UIBC: 232 ug/dL (ref 125–400)
Vitamin B-12: 695 pg/mL (ref 211–911)

## 2015-06-02 LAB — LIPID PANEL
Cholesterol: 187 mg/dL (ref 0–200)
HDL: 31 mg/dL — ABNORMAL LOW (ref >=46)
Total CHOL/HDL Ratio: 6 Ratio
Triglycerides: 585 mg/dL — ABNORMAL HIGH (ref <150)

## 2015-06-02 LAB — TSH: TSH: 1.285 u[IU]/mL (ref 0.350–4.500)

## 2015-06-02 LAB — PARATHYROID HORMONE, INTACT (NO CA): PTH: 59 pg/mL (ref 15–65)

## 2015-06-02 LAB — MICROALBUMIN / CREATININE URINE RATIO
Creatinine, Urine: 340.5 mg/dL
Microalb Creat Ratio: 87.2 mg/g — ABNORMAL HIGH (ref 0.0–30.0)
Microalb, Ur: 29.7 mg/dL — ABNORMAL HIGH (ref <2.0)

## 2015-06-02 LAB — VITAMIN D 25 HYDROXY (VIT D DEFICIENCY, FRACTURES): Vit D, 25-Hydroxy: 15 ng/mL — ABNORMAL LOW (ref 30–100)

## 2015-06-02 LAB — PHOSPHORUS: Phosphorus: 3.4 mg/dL (ref 2.3–4.6)

## 2015-06-02 LAB — HIV ANTIBODY (ROUTINE TESTING W REFLEX): HIV: NONREACTIVE

## 2015-06-02 LAB — LDL CHOLESTEROL, DIRECT: LDL DIRECT: 56 mg/dL

## 2015-06-02 LAB — HEPATITIS C ANTIBODY: HCV Ab: NEGATIVE

## 2015-06-02 NOTE — Addendum Note (Signed)
Addended byJuluis Mire on: 06/02/2015 08:57 AM   Modules accepted: Orders

## 2015-06-02 NOTE — Addendum Note (Signed)
Addended by: Resa Miner on: 06/02/2015 11:34 AM   Modules accepted: Orders

## 2015-06-03 MED ORDER — VITAMIN D (ERGOCALCIFEROL) 1.25 MG (50000 UNIT) PO CAPS: 50000.0000 [IU] | ORAL_CAPSULE | ORAL | Status: DC

## 2015-06-03 MED ORDER — GEMFIBROZIL 600 MG PO TABS: 600.0000 mg | ORAL_TABLET | Freq: Two times a day (BID) | ORAL | Status: DC

## 2015-06-03 NOTE — Addendum Note (Signed)
Addended byJuluis Mire on: 06/03/2015 08:27 AM   Modules accepted: Orders

## 2015-06-18 ENCOUNTER — Encounter: Payer: Self-pay | Admitting: Dietician

## 2015-06-24 ENCOUNTER — Telehealth: Payer: Self-pay | Admitting: Dietician

## 2015-06-24 NOTE — Telephone Encounter (Signed)
Call to patient to confirm appointment for 06/25/15 at 10:30 lmtcb

## 2015-06-25 ENCOUNTER — Ambulatory Visit (INDEPENDENT_AMBULATORY_CARE_PROVIDER_SITE_OTHER): Payer: Medicare Other | Admitting: Dietician

## 2015-06-25 VITALS — Wt 179.4 lb

## 2015-06-25 DIAGNOSIS — N183 Chronic kidney disease, stage 3 (moderate): Principal | ICD-10-CM

## 2015-06-25 DIAGNOSIS — E1165 Type 2 diabetes mellitus with hyperglycemia: Secondary | ICD-10-CM | POA: Diagnosis not present

## 2015-06-25 DIAGNOSIS — Z794 Long term (current) use of insulin: Secondary | ICD-10-CM

## 2015-06-25 DIAGNOSIS — Z713 Dietary counseling and surveillance: Secondary | ICD-10-CM | POA: Diagnosis not present

## 2015-06-25 DIAGNOSIS — E1122 Type 2 diabetes mellitus with diabetic chronic kidney disease: Secondary | ICD-10-CM

## 2015-06-25 NOTE — Patient Instructions (Signed)
You need to make a doctor appointment ASAP for your yeast infection.   I can ask Dr. Naaman Plummer about the 500 mg metformin ER  I prescribe a dose of sunlight 30 minutes on your face and arms and legs too if possible at least once time a week from April through November.  November through march is harder to get enough sunlight. You can get it from the 2-3 glasses a day of soymilk/day and supplements during this time.   PLease inject your insulin in the other side of your abdomen. We'll send in a new prescription for shorter needles.   Foods that are healthy: fruits, vegetables, whole gain bread, crackers, pasta and rice,  nuts, seeds, fish , tofu, vegetarina meats, chicken and [place]  Silk Milk- you can get the Great value Soy milk for less Bologna, sausage and bacon- stop buying and eating it.   Get whole wheat bread and crackers next time you buy them. Diet soda, tea and coffee are fine.    Please try to eat about 15-30 carbs at each meal 4 times a day.  Breakfast- 30 grams carbohydrate 12 noon snack- 30 grams carbohy drate 4 Pm snack- 30 grams carbohydrate 8 pm meal - 30 grams carbohydrate   Veggies and meats do not count- add them as needed.    Please make a follow up appointment for 3-4 weeks

## 2015-06-25 NOTE — Progress Notes (Signed)
Medical Nutrition Therapy:  Appt start time: 1030 end time:  1230.   Assessment:  Primary concerns today: glycemic control  Patient has had diabetes since 1990. She says she was on pills for the first 20 years and probably not very well controlled, then lantus for 1 year, then 70/30 for the past 4 years. Note her A1Cs have been high since 2012 and prior to that 6.3 in 2008. Keeps acanthosis nigricans around her neckcovered because she thinks it is from the sun and does not go in the sun very often. She has flagyl for yeast infection recently but still says she has the infection . She is trying to eat as healthy as possible on her limited budget, has cut out fruits. Limited starches. Is struggling with buying foods, the 14$ for her vitamin D cut her food budget down. She uses pantries t have enough food, contemplating applying for food stamps.  She has a treadmill and stationary bike that she tries to get on daily. She reports pain with walking due to what sounds like claudication. She says she refused surgery. She is weight conscious and does not want to gain nay more weight.  Needs something to help decrease her insulin ressitance. Says she has tried Trulicity and Januvia with her previous doctors one. She did not toleratre metfomrin,but was on 1000mg  BID.  She does not want more injections or to consider VGo at this time. She is willing to retry metformin. Does not sleep well due to pain and restless legs.  Has never had any low blood sugars.  Type II TDD = 0.1 u/kg - 1.5 u/kg (start at 0.5 u/kg for basal and bolus)- 41-122 units/day, currently taking 70 units/day she is displayig high insulin resistance so either needs something to break the insulin resistance or higher doses of insulin that will cause weight gain she does not want.  Meter Download: average for past 30 days is 389, blood sugar have not improved on glipizide, she ran out of strips for a few days, but has resumed checking on average  1x/day. Lowest is 221 and highest is 518 SLEEP; has trouble with sleep due to "legs jumping" Labs: Vit d note at 15 Ng, A1C 11.7 with triglycerides 585 Preferred Learning Style: Auditory,Visual, Hands on Learning Readiness: Ready  Weight is stable ~ 180# MEDICATIONS: takes insulin and glipizide without fail. Takes lopid every other day   DIETARY INTAKE: Usual eating pattern includes 4 eals and no snacks.. Everyday foods include chicken, okra, beans, bologna, mayonnaise, soymilk, ice cream.  Avoided foods include fruit and regular milk   24hr recall:  B ( AM): small portion of cream of wheat or grits, egg, bacon or sausage when she has it, water L ( 12 PM): bologna sandwich with mayo, water Snk (4 PM): bologna sandwich with mayo, water D ( 8 PM): boiled chicken, okra stewed, bread, brown rice Beverages: water, juice  Usual physical activity: treadmill 5 minutes a day, stationary bike- 30 minutes a day, cleans and does ADLs and watches TV for 6 hours a day  Recommend moderate carb, healthy fat, high fiber protein diet for weight loss to help with insulin resistance 1400-1500 calories 165-170 g carbohydrates 85-95 g protein 50 g fat  Progress Towards Goal(s):  In progress.   Nutritional Diagnosis:  NB-1.1 Food and nutrition-related knowledge deficit As related to lack of prior adeuqate diabetes meal planning.  As evidenced by her report and lack of knoweldge about fats and carbohydrates.  Intervention:  Nutrition education about healthy meal planning, diabetes medicines, pathophysiology of diabetes as well as limited and high fiber carb mela plan, healthier food choices and higher nutrient.density food choices.  Coordination of care: discuss medicine's effects on weight and encourage meal planning to prevent weight gain. Suggest retry metformin 500 mg ER and dc glipiizide  Teaching Method Utilized: Visual, Auditory,Hands on Handouts given during visit include: AVS, What to eat  heandout Barriers to learning/adherence to lifestyle change: very limited finances Demonstrated degree of understanding via:  Teach Back   Monitoring/Evaluation:  Dietary intake, exercise, meter, and body weight in 4 week(s).

## 2015-06-30 ENCOUNTER — Telehealth: Payer: Self-pay | Admitting: Internal Medicine

## 2015-06-30 NOTE — Telephone Encounter (Signed)
Call to patient to confirm appointment for 07/01/15 at 1:15 lmtcb

## 2015-07-01 ENCOUNTER — Ambulatory Visit (INDEPENDENT_AMBULATORY_CARE_PROVIDER_SITE_OTHER): Payer: Medicare Other | Admitting: Internal Medicine

## 2015-07-01 ENCOUNTER — Encounter: Payer: Self-pay | Admitting: Internal Medicine

## 2015-07-01 VITALS — BP 144/48 | HR 72 | Temp 98.1°F | Ht 62.0 in | Wt 179.6 lb

## 2015-07-01 DIAGNOSIS — N183 Chronic kidney disease, stage 3 (moderate): Secondary | ICD-10-CM | POA: Diagnosis not present

## 2015-07-01 DIAGNOSIS — Z87891 Personal history of nicotine dependence: Secondary | ICD-10-CM

## 2015-07-01 DIAGNOSIS — I10 Essential (primary) hypertension: Secondary | ICD-10-CM

## 2015-07-01 DIAGNOSIS — Z6832 Body mass index (BMI) 32.0-32.9, adult: Secondary | ICD-10-CM

## 2015-07-01 DIAGNOSIS — I129 Hypertensive chronic kidney disease with stage 1 through stage 4 chronic kidney disease, or unspecified chronic kidney disease: Secondary | ICD-10-CM | POA: Diagnosis not present

## 2015-07-01 DIAGNOSIS — E1122 Type 2 diabetes mellitus with diabetic chronic kidney disease: Secondary | ICD-10-CM | POA: Diagnosis not present

## 2015-07-01 DIAGNOSIS — E1165 Type 2 diabetes mellitus with hyperglycemia: Secondary | ICD-10-CM

## 2015-07-01 DIAGNOSIS — Z23 Encounter for immunization: Secondary | ICD-10-CM | POA: Diagnosis not present

## 2015-07-01 DIAGNOSIS — Z794 Long term (current) use of insulin: Secondary | ICD-10-CM

## 2015-07-01 DIAGNOSIS — Z0000 Encounter for general adult medical examination without abnormal findings: Secondary | ICD-10-CM

## 2015-07-01 DIAGNOSIS — E669 Obesity, unspecified: Secondary | ICD-10-CM

## 2015-07-01 DIAGNOSIS — E1151 Type 2 diabetes mellitus with diabetic peripheral angiopathy without gangrene: Secondary | ICD-10-CM

## 2015-07-01 DIAGNOSIS — I739 Peripheral vascular disease, unspecified: Secondary | ICD-10-CM

## 2015-07-01 LAB — POCT URINALYSIS DIPSTICK
Bilirubin, UA: NEGATIVE
Blood, UA: NEGATIVE
GLUCOSE UA: 250
Ketones, UA: NEGATIVE
LEUKOCYTES UA: NEGATIVE
NITRITE UA: NEGATIVE
Protein, UA: NEGATIVE
Spec Grav, UA: <=1.005
UROBILINOGEN UA: 0.2
pH, UA: 5.5

## 2015-07-01 MED ORDER — METFORMIN HCL ER 500 MG PO TB24: 500.0000 mg | ORAL_TABLET | Freq: Every day | ORAL | Status: DC

## 2015-07-01 MED ORDER — INSULIN PEN NEEDLE 31G X 6 MM MISC: Status: DC

## 2015-07-01 MED ORDER — CILOSTAZOL 100 MG PO TABS: 100.0000 mg | ORAL_TABLET | Freq: Two times a day (BID) | ORAL | Status: DC

## 2015-07-01 NOTE — Assessment & Plan Note (Addendum)
Assessment: Pt with last A1c of 11.7 on 06/01/15 compliant with oral hypoglycemic and insulin therapy with no recent symptomatic hypoglycemia who presents with home glucose values in the 300's.    Plan:  -A1c 11.7 not at goal <7, Discontinue glipizide ER 5 mg daily and start metformin XR 500 mg. Continue Novolog (70/30) mix 36 U BID. Pt to return in 2 months with glucose meter for further insulin adjustment.  -BP 144/48 not at goal <140/90, continue losartan 100 mg daily and HCTZ 25 mg daily  -LDL 56 at goal <100, continue gemfibrozil 600 mg BID for hypertriglyceridemia   -Last annual foot on 06/01/15 , pt awaiting podiatry referral  -Last annual eye exam on 06/01/15 with no retinopathy   -Obtain POC urine dipstick in setting of polyuria ----> no evidence of UTI  -BMI 32.84 not at goal <25, encourage weight loss

## 2015-07-01 NOTE — Assessment & Plan Note (Addendum)
-  Pt received tdap vaccination today on 07/01/15  -Pt to return for annual influenza vaccination (not yet available)  -Pt to return for pap smear testing -Pt given home stool cards for annual testing since pt not eligible for colonoscopy in setting of dual AP therapy for past history of stroke per GI in past note

## 2015-07-01 NOTE — Progress Notes (Signed)
[facility] Attending  Case discussed with Dr. Rabbani soon after the resident saw the patient.  We reviewed the resident's history and exam and pertinent patient test results.  I agree with the assessment, diagnosis, and plan of care documented in the resident's note.  

## 2015-07-01 NOTE — Progress Notes (Signed)
Patient ID: [name], female   DOB: 1951-10-28, 64 y.o.   MRN: [id number]    Subjective:   Patient ID: [name] female   DOB: September 14, 1951 64 y.o.   MRN: [id number]  HPI: [name] is a 64 y.o. woman with past medical history of insulin-dependent Type 2 DM, hypertension, hyperlipidemia, CKD Stage 3, hypothyroidism on replacement, vitamin D deficiency, PVD on pletal, CVA on ASA & plavix, grade 1 diastolic dysfunction, normocytic anemia, GERD, and past tobacco use who presents to follow-up on diabetes.   Her last A1c was 11.7 on 06/01/15. She checks her blood sugar twice daily but unfortunately forgot to bring her glucose meter but reports they have been in the 300's. She was recently seen by [name] on 8/12 with average blood sugar change of 302 to 389 after starting glipizide therapy and it was recommended to change glipizide to metformin (reports nausea in the past with it). She is compliant with taking Novolog mix 36 U BID. She denies symptomatic hypoglycemia. She has chronic polyuria but denies polyphagia, polydipsia, peripheral neuropathy, blurry vision, or recent foot injury/ulcer. She tries to follow a healthy diet and exercise however due to PVD has LE cramping with activity. She takes pletal as needed. Her weight has been stable.   She is compliant with taking losartan and recently prescribed HCTZ at last visit for hypertension. She has occasional lightheadedness but denies chest pain, headache, or LE edema.   She would like tdap vaccination. She was not able to have colonoscopy in setting of dual AP therapy for past history of stroke.    Past Medical History  Diagnosis Date  . CVA (cerebral vascular accident) 01/2011    affected rt side  . Diabetes mellitus   . Hypertension   . Arteriosclerotic cardiovascular disease   . Hypothyroidism   . Hypocholesterolemia   . Renal insufficiency   . Anemia   . Vitamin D deficiency   . Peripheral vascular disease   . GERD  (gastroesophageal reflux disease)   . Claudication in peripheral vascular disease   . Hyperlipidemia    Current Outpatient Prescriptions  Medication Sig Dispense Refill  . aspirin EC 81 MG tablet Take 81 mg by mouth daily.    . cilostazol (PLETAL) 100 MG tablet Take 100 mg by mouth 2 (two) times daily.    . clopidogrel (PLAVIX) 75 MG tablet Take 75 mg by mouth every morning.     Marland Kitchen gemfibrozil (LOPID) 600 MG tablet Take 1 tablet (600 mg total) by mouth 2 (two) times daily. 60 tablet 2  . glipiZIDE (GLIPIZIDE XL) 5 MG 24 hr tablet Take 1 tablet (5 mg total) by mouth daily with breakfast. 90 tablet 3  . hydrochlorothiazide (HYDRODIURIL) 25 MG tablet Take 1 tablet (25 mg total) by mouth daily. 90 tablet 3  . insulin aspart protamine- aspart (NOVOLOG MIX 70/30) (70-30) 100 UNIT/ML injection Inject 0.36 mLs (36 Units total) into the skin 2 (two) times daily with a meal. 10 mL 11  . levothyroxine (SYNTHROID) 88 MCG tablet Take 1 tablet (88 mcg total) by mouth daily before breakfast. 30 tablet 2  . losartan (COZAAR) 100 MG tablet Take 100 mg by mouth daily.    . metroNIDAZOLE (FLAGYL) 500 MG tablet Take 1 tablet (500 mg total) by mouth 2 (two) times daily. One po bid x 7 days (Patient not taking: Reported on 06/25/2015) 14 tablet 0  . ranitidine (ZANTAC) 150 MG capsule Take 150 mg by mouth  daily as needed for heartburn.    . Vitamin D, Ergocalciferol, (DRISDOL) 50000 UNITS CAPS capsule Take 1 capsule (50,000 Units total) by mouth every 7 (seven) days. 8 capsule 0   No current facility-administered medications for this visit.   Family History  Problem Relation Age of Onset  . Diabetes Mother   . Diabetes Father   . Diabetes Sister   . Diabetes Brother   . Colon cancer Brother   . Esophageal cancer Neg Hx   . Rectal cancer Neg Hx   . Stomach cancer Neg Hx    Social History   Social History  . Marital Status: Single    Spouse Name: N/A  . Number of Children: 1  . Years of Education: 10th-  GED   Occupational History  . Retired    Social History Main Topics  . Smoking status: Former Smoker    Quit date: 02/04/2011  . Smokeless tobacco: Never Used  . Alcohol Use: No  . Drug Use: No  . Sexual Activity: Not Asked   Other Topics Concern  . None   Social History Narrative   Review of Systems: Review of Systems  Constitutional: Negative for weight loss.  Eyes: Negative for blurred vision.  Respiratory: Negative for cough, shortness of breath and wheezing.   Cardiovascular: Negative for chest pain and leg swelling.  Gastrointestinal: Negative for nausea, vomiting, abdominal pain, diarrhea, constipation, blood in stool and melena.  Genitourinary: Negative for dysuria, urgency, frequency and hematuria.       Polyuria  Musculoskeletal: Positive for back pain (chronic low back (R>L)).  Neurological: Positive for dizziness (lightheadedness ). Negative for sensory change and headaches.    Objective:  Physical Exam: Filed Vitals:   07/01/15 1321  BP: 144/48  Pulse: 72  Temp: 98.1 F (36.7 C)  TempSrc: Oral  Height: 5\' 2"  (1.575 m)  Weight: 179 lb 9.6 oz (81.466 kg)  SpO2: 99%    Physical Exam  Assessment & Plan:   Please see problem list for problem-based assessment and plan

## 2015-07-01 NOTE — Assessment & Plan Note (Signed)
Assessment: Pt with moderately well-controlled hypertension compliant with two-class (ARB & diuretic) anti-hypertensive therapy who presents with improved blood pressure of 144/48.   Plan:  -BP 144/48 near goal <140/90 -Continue HCTZ 25 mg daily and losartan 100 mg daily  -Last CMP on 05/21/15 with stable CKD Stage 3

## 2015-07-01 NOTE — Assessment & Plan Note (Signed)
Assessment: Pt is a non-smoker with claudication in setting of PVD partially compliant with medical therapy.     Plan:  -Continue aspirin 81 mg daily -Refill pletal 100 mg BID, pt instructed on importance of taking as directed (was taking PRN)  -Continue physical activity

## 2015-07-01 NOTE — Patient Instructions (Signed)
-  Don't take glipizide anymore, instead start taking extended release metformin 500 mg daily for your diabetes, continue your insulin -I refilled you pletal, take it twice a day for your leg pain  -Will give you a tetanus shot today  -Please bring the stool cards back next time  -Please come back for a pap smear and for your diabetes in 2 months, glad you are doing better!   General Instructions:   Thank you for bringing your medicines today. This helps [place] keep you safe from mistakes.   Progress Toward Treatment Goals:  No flowsheet data found.  Self Care Goals & Plans:  Self Care Goal 07/01/2015  Manage my medications take my medicines as prescribed; bring my medications to every visit; refill my medications on time; follow the sick day instructions if I am sick  Monitor my health keep track of my blood glucose; keep track of my weight; check my feet daily  Eat healthy foods eat more vegetables; eat fruit for snacks and desserts; eat baked foods instead of fried foods; eat foods that are low in salt; drink diet soda or water instead of juice or soda  Be physically active find an activity I enjoy    No flowsheet data found.   Care Management & Community Referrals:  No flowsheet data found.

## 2015-07-02 LAB — GLUCOSE, CAPILLARY: Glucose-Capillary: 302 mg/dL — ABNORMAL HIGH (ref 65–99)

## 2015-07-20 ENCOUNTER — Other Ambulatory Visit: Payer: Self-pay | Admitting: Internal Medicine

## 2015-07-20 MED ORDER — CLOPIDOGREL BISULFATE 75 MG PO TABS: 75.0000 mg | ORAL_TABLET | ORAL | Status: DC

## 2015-07-20 NOTE — Telephone Encounter (Signed)
Pt requesting Plavix refill from [facility] on [address].

## 2015-07-27 ENCOUNTER — Telehealth: Payer: Self-pay | Admitting: Dietician

## 2015-08-04 NOTE — Telephone Encounter (Signed)
Was asked to call back tomorrow in the morning.

## 2015-08-06 ENCOUNTER — Telehealth: Payer: Self-pay | Admitting: Internal Medicine

## 2015-08-06 NOTE — Telephone Encounter (Signed)
Pt called requesting ranitidine to be filled @ [facility] on [address].

## 2015-08-11 ENCOUNTER — Encounter (HOSPITAL_COMMUNITY): Payer: Self-pay | Admitting: *Deleted

## 2015-08-11 ENCOUNTER — Emergency Department (HOSPITAL_COMMUNITY)
Admission: EM | Admit: 2015-08-11 | Discharge: 2015-08-11 | Disposition: A | Discharge status: Home / Self Care | Payer: Medicare Other | Attending: Emergency Medicine | Admitting: Emergency Medicine

## 2015-08-11 ENCOUNTER — Emergency Department (HOSPITAL_COMMUNITY): Payer: Medicare Other

## 2015-08-11 DIAGNOSIS — Z79899 Other long term (current) drug therapy: Secondary | ICD-10-CM | POA: Diagnosis not present

## 2015-08-11 DIAGNOSIS — H6691 Otitis media, unspecified, right ear: Secondary | ICD-10-CM | POA: Diagnosis not present

## 2015-08-11 DIAGNOSIS — E785 Hyperlipidemia, unspecified: Secondary | ICD-10-CM | POA: Insufficient documentation

## 2015-08-11 DIAGNOSIS — Z87891 Personal history of nicotine dependence: Secondary | ICD-10-CM | POA: Insufficient documentation

## 2015-08-11 DIAGNOSIS — Z7902 Long term (current) use of antithrombotics/antiplatelets: Secondary | ICD-10-CM | POA: Insufficient documentation

## 2015-08-11 DIAGNOSIS — K219 Gastro-esophageal reflux disease without esophagitis: Secondary | ICD-10-CM | POA: Diagnosis not present

## 2015-08-11 DIAGNOSIS — R05 Cough: Secondary | ICD-10-CM

## 2015-08-11 DIAGNOSIS — Z862 Personal history of diseases of the blood and blood-forming organs and certain disorders involving the immune mechanism: Secondary | ICD-10-CM | POA: Insufficient documentation

## 2015-08-11 DIAGNOSIS — R059 Cough, unspecified: Secondary | ICD-10-CM

## 2015-08-11 DIAGNOSIS — Z8673 Personal history of transient ischemic attack (TIA), and cerebral infarction without residual deficits: Secondary | ICD-10-CM | POA: Insufficient documentation

## 2015-08-11 DIAGNOSIS — E039 Hypothyroidism, unspecified: Secondary | ICD-10-CM | POA: Diagnosis not present

## 2015-08-11 DIAGNOSIS — I10 Essential (primary) hypertension: Secondary | ICD-10-CM | POA: Insufficient documentation

## 2015-08-11 DIAGNOSIS — Z792 Long term (current) use of antibiotics: Secondary | ICD-10-CM | POA: Insufficient documentation

## 2015-08-11 DIAGNOSIS — E119 Type 2 diabetes mellitus without complications: Secondary | ICD-10-CM | POA: Diagnosis not present

## 2015-08-11 DIAGNOSIS — Z794 Long term (current) use of insulin: Secondary | ICD-10-CM | POA: Diagnosis not present

## 2015-08-11 DIAGNOSIS — J029 Acute pharyngitis, unspecified: Secondary | ICD-10-CM

## 2015-08-11 DIAGNOSIS — R509 Fever, unspecified: Secondary | ICD-10-CM | POA: Diagnosis not present

## 2015-08-11 LAB — COMPREHENSIVE METABOLIC PANEL
ALBUMIN: 3.8 g/dL (ref 3.5–5.0)
ALK PHOS: 63 U/L (ref 38–126)
ALT: 29 U/L (ref 14–54)
ANION GAP: 8 (ref 5–15)
AST: 37 U/L (ref 15–41)
BUN: 19 mg/dL (ref 6–20)
CALCIUM: 9.5 mg/dL (ref 8.9–10.3)
CHLORIDE: 105 mmol/L (ref 101–111)
CO2: 25 mmol/L (ref 22–32)
Creatinine, Ser: 1.59 mg/dL — ABNORMAL HIGH (ref 0.44–1.00)
GFR calc non Af Amer: 33 mL/min — ABNORMAL LOW (ref >60)
GFR, EST AFRICAN AMERICAN: 39 mL/min — AB (ref >60)
GLUCOSE: 254 mg/dL — AB (ref 65–99)
POTASSIUM: 4 mmol/L (ref 3.5–5.1)
SODIUM: 138 mmol/L (ref 135–145)
Total Bilirubin: 0.6 mg/dL (ref 0.3–1.2)
Total Protein: 7.2 g/dL (ref 6.5–8.1)

## 2015-08-11 LAB — CBC
HCT: 33 % — ABNORMAL LOW (ref 36.0–46.0)
HEMOGLOBIN: 10.7 g/dL — AB (ref 12.0–15.0)
MCH: 28.9 pg (ref 26.0–34.0)
MCHC: 32.4 g/dL (ref 30.0–36.0)
MCV: 89.2 fL (ref 78.0–100.0)
PLATELETS: 211 10*3/uL (ref 150–400)
RBC: 3.7 MIL/uL — AB (ref 3.87–5.11)
RDW: 14.9 % (ref 11.5–15.5)
WBC: 4.8 10*3/uL (ref 4.0–10.5)

## 2015-08-11 LAB — RAPID STREP SCREEN (MED CTR MEBANE ONLY): Streptococcus, Group A Screen (Direct): NEGATIVE

## 2015-08-11 LAB — I-STAT CG4 LACTIC ACID, ED: Lactic Acid, Venous: 0.63 mmol/L (ref 0.5–2.0)

## 2015-08-11 MED ORDER — IPRATROPIUM-ALBUTEROL 0.5-2.5 (3) MG/3ML IN SOLN
3.0000 mL | Freq: Once | RESPIRATORY_TRACT | Status: AC
  Administered 2015-08-11: 3 mL via RESPIRATORY_TRACT
  Filled 2015-08-11: qty 3

## 2015-08-11 MED ORDER — ACETAMINOPHEN 500 MG PO TABS
500.0000 mg | ORAL_TABLET | Freq: Once | ORAL | Status: AC
  Administered 2015-08-11: 500 mg via ORAL
  Filled 2015-08-11: qty 1

## 2015-08-11 MED ORDER — IPRATROPIUM-ALBUTEROL 0.5-2.5 (3) MG/3ML IN SOLN
RESPIRATORY_TRACT | Status: AC
  Administered 2015-08-11: 3 mL via RESPIRATORY_TRACT
  Filled 2015-08-11: qty 3

## 2015-08-11 MED ORDER — AMOXICILLIN-POT CLAVULANATE 875-125 MG PO TABS: 1.0000 | ORAL_TABLET | Freq: Two times a day (BID) | ORAL | Status: DC

## 2015-08-11 NOTE — Telephone Encounter (Signed)
Was calling to follow up on diabetes medicine changes. Left message for patient to call with  questions or concerns.

## 2015-08-11 NOTE — ED Notes (Addendum)
Pt reports sore throat since Thursday and fever/chills. Airway intact, pt diaphoretic at triage, temp 99.2 but took tylenol at 1500.

## 2015-08-11 NOTE — ED Provider Notes (Signed)
CSN: [id number]     Arrival date & time 08/11/15  1545 History   First MD Initiated Contact with Patient 08/11/15 1854     Chief Complaint  Patient presents with  . Fever  . Sore Throat     (Consider location/radiation/quality/duration/timing/severity/associated sxs/prior Treatment) HPI [name] is a 64 y.o. female with PMH significant for CVA, DM, HTN, HLD, CKD, PVD who presents with 6 day history of gradual, persistent, worsening sore throat with associated fevers, chills, diaphoresis, cough, rhinorrhea, and right ear pain, and voice changes.  Denies sinus pain, SOB, CP, N/V/D, or abdominal pain.  She has been taking OTC 500 mg Tylenol and found it somewhat helpful.  Nothing makes it worse.  Denies tobacco use.  No sick contacts.  Past Medical History  Diagnosis Date  . CVA (cerebral vascular accident) 01/2011    affected rt side  . Diabetes mellitus   . Hypertension   . Arteriosclerotic cardiovascular disease   . Hypothyroidism   . Hypocholesterolemia   . Renal insufficiency   . Anemia   . Vitamin D deficiency   . Peripheral vascular disease   . GERD (gastroesophageal reflux disease)   . Claudication in peripheral vascular disease   . Hyperlipidemia    Past Surgical History  Procedure Laterality Date  . Tubal ligation    . Cesarean section    . Left heart catheterization with coronary angiogram N/A 03/08/2012    Procedure: LEFT HEART CATHETERIZATION WITH CORONARY ANGIOGRAM;  Surgeon: Sinclair Grooms, MD;  Location: [facility] CATH LAB;  Service: Cardiovascular;  Laterality: N/A;  . Lower extremity angiogram N/A 12/17/2012    Procedure: LOWER EXTREMITY ANGIOGRAM;  Surgeon: Laverda Page, MD;  Location: [facility] CATH LAB;  Service: Cardiovascular;  Laterality: N/A;  . Left heart catheterization with coronary angiogram N/A 12/17/2012    Procedure: LEFT HEART CATHETERIZATION WITH CORONARY ANGIOGRAM;  Surgeon: Laverda Page, MD;  Location: [facility] CATH LAB;  Service: Cardiovascular;   Laterality: N/A;   Family History  Problem Relation Age of Onset  . Diabetes Mother   . Diabetes Father   . Diabetes Sister   . Diabetes Brother   . Colon cancer Brother   . Esophageal cancer Neg Hx   . Rectal cancer Neg Hx   . Stomach cancer Neg Hx    Social History  Substance Use Topics  . Smoking status: Former Smoker    Quit date: 02/04/2011  . Smokeless tobacco: Never Used  . Alcohol Use: No   OB History    No data available     Review of Systems All other systems negative unless otherwise stated in HPI    Allergies  Insulins  Home Medications   Prior to Admission medications   Medication Sig Start Date End Date Taking? Authorizing Provider  amoxicillin-clavulanate (AUGMENTIN) 875-125 MG tablet Take 1 tablet by mouth 2 (two) times daily. 08/11/15   Gloriann Loan, PA-C  aspirin EC 81 MG tablet Take 81 mg by mouth daily.    Historical Provider, MD  cilostazol (PLETAL) 100 MG tablet Take 1 tablet (100 mg total) by mouth 2 (two) times daily. 07/01/15   Juluis Mire, MD  clopidogrel (PLAVIX) 75 MG tablet Take 1 tablet (75 mg total) by mouth every morning. 07/20/15   Juluis Mire, MD  gemfibrozil (LOPID) 600 MG tablet Take 1 tablet (600 mg total) by mouth 2 (two) times daily. 06/03/15 06/02/16  Juluis Mire, MD  hydrochlorothiazide (HYDRODIURIL) 25 MG tablet Take 1 tablet (  25 mg total) by mouth daily. 06/01/15   Juluis Mire, MD  insulin aspart protamine- aspart (NOVOLOG MIX 70/30) (70-30) 100 UNIT/ML injection Inject 0.36 mLs (36 Units total) into the skin 2 (two) times daily with a meal. 06/01/15   Juluis Mire, MD  Insulin Pen Needle 31G X 6 MM MISC Use to inject insulin twice daily 07/01/15   Juluis Mire, MD  levothyroxine (SYNTHROID) 88 MCG tablet Take 1 tablet (88 mcg total) by mouth daily before breakfast. 03/02/14   Delfina Redwood, MD  losartan (COZAAR) 100 MG tablet Take 100 mg by mouth daily.    Historical Provider, MD  metFORMIN (GLUCOPHAGE-XR) 500 MG 24 hr  tablet Take 1 tablet (500 mg total) by mouth daily with breakfast. 07/01/15   Juluis Mire, MD  ranitidine (ZANTAC) 150 MG capsule Take 150 mg by mouth daily as needed for heartburn.    Historical Provider, MD  Vitamin D, Ergocalciferol, (DRISDOL) 50000 UNITS CAPS capsule Take 1 capsule (50,000 Units total) by mouth every 7 (seven) days. 06/03/15   Marjan Rabbani, MD   BP 169/66 mmHg  Pulse 67  Temp(Src) 98 F (36.7 C) (Oral)  Resp 16  SpO2 95% Physical Exam  Constitutional: She appears well-developed and well-nourished. No distress.  HENT:  Head: Normocephalic and atraumatic.  Right Ear: Tympanic membrane is erythematous. Tympanic membrane is not retracted and not bulging.  Left Ear: Tympanic membrane normal.  Nose: Nose normal.  Mouth/Throat: Uvula is midline, oropharynx is clear and moist and mucous membranes are normal. No trismus in the jaw. No uvula swelling. No oropharyngeal exudate, posterior oropharyngeal edema, posterior oropharyngeal erythema or tonsillar abscesses.  Eyes: Conjunctivae are normal.  Neck: Trachea normal and normal range of motion. Neck supple. No rigidity. No edema, no erythema and normal range of motion present.  Cardiovascular: Normal rate, regular rhythm and normal heart sounds.   Pulmonary/Chest: No accessory muscle usage. No respiratory distress. She has wheezes. She has no rhonchi. She has no rales.  Abdominal: Soft. Normal appearance and normal aorta. There is no tenderness.  Skin: She is not diaphoretic.    ED Course  Procedures (including critical care time) Labs Review Labs Reviewed  COMPREHENSIVE METABOLIC PANEL - Abnormal; Notable for the following:    Glucose, Bld 254 (*)    Creatinine, Ser 1.59 (*)    GFR calc non Af Amer 33 (*)    GFR calc Af Amer 39 (*)    All other components within normal limits  CBC - Abnormal; Notable for the following:    RBC 3.70 (*)    Hemoglobin 10.7 (*)    HCT 33.0 (*)    All other components within normal  limits  RAPID STREP SCREEN (NOT AT [facility])  CULTURE, GROUP A STREP  I-STAT CG4 LACTIC ACID, ED    Imaging Review Dg Neck Soft Tissue  08/11/2015   CLINICAL DATA:  Sore throat for 1 week  EXAM: NECK SOFT TISSUES - 1+ VIEW  COMPARISON:  None.  FINDINGS: There is no evidence of retropharyngeal soft tissue swelling or epiglottic enlargement. The cervical airway is unremarkable and no radio-opaque foreign body identified.  Apical interstitial coarsening and upper mediastinal widening, evaluated on contemporaneous dedicated chest radiography.  IMPRESSION: Negative cervical airway.   Electronically Signed   By: Monte Fantasia M.D.   On: 08/11/2015 21:14   Dg Chest 2 View  08/11/2015   CLINICAL DATA:  Fever, chills, sore throat for week.  EXAM: CHEST  2 VIEW  COMPARISON:  September 04, 2014  FINDINGS: The heart size and mediastinal contours are within normal limits. There is no focal pneumonia or pleural effusion. There is mild increased pulmonary interstitium bilaterally. The visualized skeletal structures are stable.  IMPRESSION: Mild increased pulmonary interstitium bilaterally, this can be seen in viral etiology.   Electronically Signed   By: Abelardo Diesel M.D.   On: 08/11/2015 19:46   I have personally reviewed and evaluated these images and lab results as part of my medical decision-making.   EKG Interpretation None      MDM   Final diagnoses:  Sore throat  Cough   Patient presents with 6 day history of gradual, persistent, worsening sore throat and upper respiratory congestion with R ear pain.  No difficulty breathing.  Patient speaking without difficulty.VSS, NAD, pt appears non-toxic.  On exam, minimal wheezing in LLL.  Uvula midline, no exudates, no peritonsillar abscess.  Mucous membranes moist.  Erythematous right TM, no bulging.  Concern for retropharyngeal abscess, neck soft tissue film pending.  CXR shows mild increased pulmonary interstitium bilaterally, which can be seen in viral  etiology.   Doubt strep pharyngitis given negative rapid strep.  Doubt PNA.    Suspect R otitis media and pharyngitis.   If neck soft tissue negative will d/c home with Augmentin 75 mg BID x 7 days.   Case has been discussed with  Dr. Darl Householder who agrees with the above plan.  Patient hand off to Junius Creamer, NP at shift change.      Gloriann Loan, PA-C 08/11/15 [place] Yao, MD 08/12/15 [phone]

## 2015-08-11 NOTE — Discharge Instructions (Signed)
Sore Throat A sore throat is a painful, burning, sore, or scratchy feeling of the throat. There may be pain or tenderness when swallowing or talking. You may have other symptoms with a sore throat. These include coughing, sneezing, fever, or a swollen neck. A sore throat is often the first sign of another sickness. These sicknesses may include a cold, flu, strep throat, or an infection called mono. Most sore throats go away without medical treatment.  HOME CARE   Only take medicine as told by your doctor.  Drink enough fluids to keep your pee (urine) clear or pale yellow.  Rest as needed.  Try using throat sprays, lozenges, or suck on hard candy (if older than 4 years or as told).  Sip warm liquids, such as broth, herbal tea, or warm water with honey. Try sucking on frozen ice pops or drinking cold liquids.  Rinse the mouth (gargle) with salt water. Mix 1 teaspoon salt with 8 ounces of water.  Do not smoke. Avoid being around others when they are smoking.  Put a humidifier in your bedroom at night to moisten the air. You can also turn on a hot shower and sit in the bathroom for 5-10 minutes. Be sure the bathroom door is closed. GET HELP RIGHT AWAY IF:   You have trouble breathing.  You cannot swallow fluids, soft foods, or your spit (saliva).  You have more puffiness (swelling) in the throat.  Your sore throat does not get better in 7 days.  You feel sick to your stomach (nauseous) and throw up (vomit).  You have a fever or lasting symptoms for more than 2-3 days.  You have a fever and your symptoms suddenly get worse. MAKE SURE YOU:   Understand these instructions.  Will watch your condition.  Will get help right away if you are not doing well or get worse. Document Released: 08/08/2008 Document Revised: 07/24/2012 Document Reviewed: 07/07/2012 [facility] Patient Information 2015 [place], [place]. This information is not intended to replace advice given to you by your health  care provider. Make sure you discuss any questions you have with your health care provider.   Cough, Adult  A cough is a reflex. It helps you clear your throat and airways. A cough can help heal your body. A cough can last 2 or 3 weeks (acute) or may last more than 8 weeks (chronic). Some common causes of a cough can include an infection, allergy, or a cold. HOME CARE  Only take medicine as told by your doctor.  If given, take your medicines (antibiotics) as told. Finish them even if you start to feel better.  Use a cold steam vaporizer or humidifier in your home. This can help loosen thick spit (secretions).  Sleep so you are almost sitting up (semi-upright). Use pillows to do this. This helps reduce coughing.  Rest as needed.  Stop smoking if you smoke. GET HELP RIGHT AWAY IF:  You have yellowish-white fluid (pus) in your thick spit.  Your cough gets worse.  Your medicine does not reduce coughing, and you are losing sleep.  You cough up blood.  You have trouble breathing.  Your pain gets worse and medicine does not help.  You have a fever. MAKE SURE YOU:   Understand these instructions.  Will watch your condition.  Will get help right away if you are not doing well or get worse. Document Released: 07/13/2011 Document Revised: 03/16/2014 Document Reviewed: 07/13/2011 [facility] Patient Information 2015 [place], [place]. This information is  not intended to replace advice given to you by your health care provider. Make sure you discuss any questions you have with your health care provider.

## 2015-08-12 ENCOUNTER — Other Ambulatory Visit: Payer: Self-pay | Admitting: Internal Medicine

## 2015-08-13 LAB — CULTURE, GROUP A STREP: STREP A CULTURE: NEGATIVE

## 2015-09-09 ENCOUNTER — Other Ambulatory Visit: Payer: Self-pay | Admitting: Internal Medicine

## 2015-09-09 MED ORDER — LEVOTHYROXINE SODIUM 88 MCG PO TABS: 88.0000 ug | ORAL_TABLET | Freq: Every day | ORAL | Status: DC

## 2015-09-09 NOTE — Telephone Encounter (Signed)
Pt requesting levothyroxine to be filled.

## 2015-09-17 ENCOUNTER — Ambulatory Visit (INDEPENDENT_AMBULATORY_CARE_PROVIDER_SITE_OTHER): Payer: Medicare Other | Admitting: Internal Medicine

## 2015-09-17 VITALS — BP 138/59 | HR 59 | Temp 98.2°F | Wt 182.6 lb

## 2015-09-17 DIAGNOSIS — I129 Hypertensive chronic kidney disease with stage 1 through stage 4 chronic kidney disease, or unspecified chronic kidney disease: Secondary | ICD-10-CM | POA: Diagnosis not present

## 2015-09-17 DIAGNOSIS — Z0000 Encounter for general adult medical examination without abnormal findings: Secondary | ICD-10-CM

## 2015-09-17 DIAGNOSIS — E1122 Type 2 diabetes mellitus with diabetic chronic kidney disease: Secondary | ICD-10-CM | POA: Diagnosis not present

## 2015-09-17 DIAGNOSIS — N183 Chronic kidney disease, stage 3 (moderate): Secondary | ICD-10-CM | POA: Diagnosis not present

## 2015-09-17 DIAGNOSIS — Z794 Long term (current) use of insulin: Secondary | ICD-10-CM

## 2015-09-17 DIAGNOSIS — E781 Pure hyperglyceridemia: Secondary | ICD-10-CM

## 2015-09-17 DIAGNOSIS — I10 Essential (primary) hypertension: Secondary | ICD-10-CM

## 2015-09-17 DIAGNOSIS — E1165 Type 2 diabetes mellitus with hyperglycemia: Secondary | ICD-10-CM

## 2015-09-17 DIAGNOSIS — Z9114 Patient's other noncompliance with medication regimen: Secondary | ICD-10-CM

## 2015-09-17 LAB — GLUCOSE, CAPILLARY: Glucose-Capillary: 81 mg/dL (ref 65–99)

## 2015-09-17 LAB — POCT GLYCOSYLATED HEMOGLOBIN (HGB A1C): Hemoglobin A1C: 10.3

## 2015-09-17 NOTE — Progress Notes (Signed)
Patient ID: [name], female   DOB: 1951-03-29, 64 y.o.   MRN: [id number]      Subjective:   Patient ID: [name] female   DOB: 09-13-1951 64 y.o.   MRN: [id number]  HPI: [name] is a 64 y.o. woman with past medical history of insulin-dependent Type 2 DM, hypertension, hyperlipidemia, CKD Stage 3, hypothyroidism on replacement, vitamin D deficiency, PVD on pletal, CVA on ASA & plavix, grade 1 diastolic dysfunction, normocytic anemia, GERD, and past tobacco use who presents to follow-up of diabetes.   Her last A1c was 11.7 on 06/01/15. She checks her blood sugar once every other day but unfortunately forgot to bring her glucose meter. She reports her average blood sugars have been in the 100's. She is no longer taking metformin due to back pain but is compliant with taking Novolog mix 35 U BID. She has 1 episode of symptomatic hypoglycemia last night but denies prior episodes. She denies polyphagia, polydipsia, polyuria, peripheral neuropathy, blurry vision, or recent foot injury/ulcer. She tries to follow a healthy diet and has been able to exercise more after taking pletal consistently. She has gained 3 lb's since last visit 3 months ago.   She is compliant with taking losartan and HCTZ  for hypertension. She denies lightheadedness, chest pain, headache, or LE edema.   She declines flu shot.     Past Medical History  Diagnosis Date  . CVA (cerebral vascular accident) 01/2011    affected rt side  . Diabetes mellitus   . Hypertension   . Arteriosclerotic cardiovascular disease   . Hypothyroidism   . Hypocholesterolemia   . Renal insufficiency   . Anemia   . Vitamin D deficiency   . Peripheral vascular disease   . GERD (gastroesophageal reflux disease)   . Claudication in peripheral vascular disease   . Hyperlipidemia    Current Outpatient Prescriptions  Medication Sig Dispense Refill  . amoxicillin-clavulanate (AUGMENTIN) 875-125 MG tablet Take 1 tablet by mouth  2 (two) times daily. 14 tablet 0  . aspirin EC 81 MG tablet Take 81 mg by mouth daily.    . cilostazol (PLETAL) 100 MG tablet Take 1 tablet (100 mg total) by mouth 2 (two) times daily. 180 tablet 3  . clopidogrel (PLAVIX) 75 MG tablet Take 1 tablet (75 mg total) by mouth every morning. 90 tablet 3  . gemfibrozil (LOPID) 600 MG tablet Take 1 tablet (600 mg total) by mouth 2 (two) times daily. 60 tablet 2  . hydrochlorothiazide (HYDRODIURIL) 25 MG tablet Take 1 tablet (25 mg total) by mouth daily. 90 tablet 3  . insulin aspart protamine- aspart (NOVOLOG MIX 70/30) (70-30) 100 UNIT/ML injection Inject 0.36 mLs (36 Units total) into the skin 2 (two) times daily with a meal. 10 mL 11  . Insulin Pen Needle 31G X 6 MM MISC Use to inject insulin twice daily 100 each 3  . levothyroxine (SYNTHROID) 88 MCG tablet Take 1 tablet (88 mcg total) by mouth daily before breakfast. 90 tablet 3  . losartan (COZAAR) 100 MG tablet Take 100 mg by mouth daily.    . metFORMIN (GLUCOPHAGE-XR) 500 MG 24 hr tablet Take 1 tablet (500 mg total) by mouth daily with breakfast. 30 tablet 5  . ranitidine (ZANTAC) 150 MG tablet TAKE 1 TABLET BY MOUTH EVERY DAY AS NEEDED 30 tablet 4  . Vitamin D, Ergocalciferol, (DRISDOL) 50000 UNITS CAPS capsule Take 1 capsule (50,000 Units total) by mouth every 7 (seven) days.  8 capsule 0   No current facility-administered medications for this visit.   Family History  Problem Relation Age of Onset  . Diabetes Mother   . Diabetes Father   . Diabetes Sister   . Diabetes Brother   . Colon cancer Brother   . Esophageal cancer Neg Hx   . Rectal cancer Neg Hx   . Stomach cancer Neg Hx    Social History   Social History  . Marital Status: Single    Spouse Name: N/A  . Number of Children: 1  . Years of Education: 10th- GED   Occupational History  . Retired    Social History Main Topics  . Smoking status: Former Smoker    Quit date: 02/04/2011  . Smokeless tobacco: Never Used  .  Alcohol Use: No  . Drug Use: No  . Sexual Activity: Not on file   Other Topics Concern  . Not on file   Social History Narrative   Review of Systems: Review of Systems  Constitutional:       Weight gain  Eyes: Negative for blurred vision.  Respiratory: Negative for cough, shortness of breath and wheezing.   Gastrointestinal: Positive for constipation (chronic). Negative for nausea, vomiting, abdominal pain and diarrhea.  Genitourinary: Negative for dysuria, urgency and frequency.  Musculoskeletal: Positive for myalgias (occasional left arm squeezing).  Neurological: Negative for dizziness, sensory change and headaches.  Endo/Heme/Allergies: Negative for polydipsia.     Objective:  Physical Exam: Filed Vitals:   09/17/15 1323 09/17/15 1404  BP: 164/67 138/59  Pulse: 66 59  Temp: 98.2 F (36.8 C)   TempSrc: Oral   Weight: 182 lb 9.6 oz (82.827 kg)   SpO2: 100%     Physical Exam  Constitutional: She is oriented to person, place, and time. She appears well-developed and well-nourished. No distress.  HENT:  Head: Normocephalic and atraumatic.  Right Ear: External ear normal.  Left Ear: External ear normal.  Nose: Nose normal.  Mouth/Throat: Oropharynx is clear and moist. No oropharyngeal exudate.  Eyes: Conjunctivae and EOM are normal. Pupils are equal, round, and reactive to light. Right eye exhibits no discharge. Left eye exhibits no discharge. No scleral icterus.  Neck: Normal range of motion. Neck supple.  Cardiovascular: Normal rate, regular rhythm and normal heart sounds.   Pulmonary/Chest: Effort normal and breath sounds normal. No respiratory distress. She has no wheezes. She has no rales.  Abdominal: Soft. Bowel sounds are normal. She exhibits no distension. There is no tenderness. There is no rebound and no guarding.  Musculoskeletal: Normal range of motion. She exhibits no edema or tenderness.  Neurological: She is alert and oriented to person, place, and time.   Skin: Skin is warm and dry. No rash noted. She is not diaphoretic. No erythema. No pallor.  Psychiatric: She has a normal mood and affect. Her behavior is normal. Judgment and thought content normal.    Assessment & Plan:   Please see problem list for problem-based assessment and plan

## 2015-09-17 NOTE — Assessment & Plan Note (Signed)
-  Pt declines annual influenza vaccination  -Pt did not return stool cards from last visit and declines additional testing   -Pt instructed to return for pap smear testing

## 2015-09-17 NOTE — Assessment & Plan Note (Addendum)
Assessment: Pt with last A1c of 11.7 on 06/01/15 noncompliant with oral hypoglycemic but compliant with insulin therapy with recent symptomatic hypoglycemia who presents with blood glucose of 81 with improved A1c of 10.3      Plan:  -A1c 10.3 not at goal <7, Discontinue metformin XR 500 mg as pt no longer taking. Pt declines starting alternative oral hypoglycemic therapy or GLP-1 agonist. Continue Novolog (70/30) mix 35 U BID. Pt to return in 1 month with glucose meter for further insulin adjustment.  -BP 138/59 at goal <140/90, continue losartan 100 mg daily and HCTZ 25 mg daily  -LDL 56 at goal <100, continue gemfibrozil 600 mg BID for hypertriglyceridemia  -Last annual foot on 06/01/15  -Last annual eye exam on 06/01/15 with no retinopathy  -BMI 33.39 not at goal <25, encourage weight loss

## 2015-09-17 NOTE — Assessment & Plan Note (Signed)
Assessment: Pt with moderately well-controlled hypertension compliant with two-class (ARB & diuretic) anti-hypertensive therapy who presents with improved blood pressure of 135/59.   Plan:  -BP 135/59 at goal <140/90 -Continue HCTZ 25 mg daily and losartan 100 mg daily  -Last CMP on 08/11/15 with stable CKD Stage 3

## 2015-09-17 NOTE — Patient Instructions (Signed)
-  Your A1c is 10.3 (last time was 11.7) which is still above goal of less than 7, keep checking your blood sugars and bring your meter next time -Make sure you take all your medications everyday -Will check your cholesterol and vitamin D level next time -Please come back in 1 month with your meter  General Instructions:   Please bring your medicines with you each time you come to clinic.  Medicines may include prescription medications, over-the-counter medications, herbal remedies, eye drops, vitamins, or other pills.   Progress Toward Treatment Goals:  No flowsheet data found.  Self Care Goals & Plans:  Self Care Goal 07/01/2015  Manage my medications take my medicines as prescribed; bring my medications to every visit; refill my medications on time; follow the sick day instructions if I am sick  Monitor my health keep track of my blood glucose; keep track of my weight; check my feet daily  Eat healthy foods eat more vegetables; eat fruit for snacks and desserts; eat baked foods instead of fried foods; eat foods that are low in salt; drink diet soda or water instead of juice or soda  Be physically active find an activity I enjoy    No flowsheet data found.   Care Management & Community Referrals:  No flowsheet data found.

## 2015-09-20 NOTE — Progress Notes (Signed)
Medicine attending: Medical history, presenting problems, physical findings, and medications, reviewed with Dr Marjan Rabbani on the day of the patient visit and I concur with her evaluation and management plan. 

## 2015-10-29 ENCOUNTER — Ambulatory Visit (INDEPENDENT_AMBULATORY_CARE_PROVIDER_SITE_OTHER): Payer: Medicare Other | Admitting: Internal Medicine

## 2015-10-29 ENCOUNTER — Encounter: Payer: Self-pay | Admitting: Internal Medicine

## 2015-10-29 VITALS — BP 137/42 | HR 81 | Temp 97.9°F | Ht 62.0 in | Wt 185.5 lb

## 2015-10-29 DIAGNOSIS — I129 Hypertensive chronic kidney disease with stage 1 through stage 4 chronic kidney disease, or unspecified chronic kidney disease: Secondary | ICD-10-CM

## 2015-10-29 DIAGNOSIS — E1165 Type 2 diabetes mellitus with hyperglycemia: Secondary | ICD-10-CM | POA: Diagnosis not present

## 2015-10-29 DIAGNOSIS — Z7984 Long term (current) use of oral hypoglycemic drugs: Secondary | ICD-10-CM

## 2015-10-29 DIAGNOSIS — N183 Chronic kidney disease, stage 3 (moderate): Secondary | ICD-10-CM | POA: Diagnosis not present

## 2015-10-29 DIAGNOSIS — I10 Essential (primary) hypertension: Secondary | ICD-10-CM

## 2015-10-29 DIAGNOSIS — E1122 Type 2 diabetes mellitus with diabetic chronic kidney disease: Secondary | ICD-10-CM

## 2015-10-29 DIAGNOSIS — Z794 Long term (current) use of insulin: Secondary | ICD-10-CM

## 2015-10-29 LAB — GLUCOSE, CAPILLARY: Glucose-Capillary: 160 mg/dL — ABNORMAL HIGH (ref 65–99)

## 2015-10-29 NOTE — Patient Instructions (Signed)
-  Start taking glipizide 5 mg twice daily  -Keep checking your blood sugars  -Please come back end of Feb for your diabetes -Have a very happy holiday!   General Instructions:   Thank you for bringing your medicines today. This helps [place] keep you safe from mistakes.   Progress Toward Treatment Goals:  No flowsheet data found.  Self Care Goals & Plans:  Self Care Goal 10/29/2015  Manage my medications take my medicines as prescribed; bring my medications to every visit; refill my medications on time  Monitor my health keep track of my blood glucose; bring my glucose meter and log to each visit  Eat healthy foods drink diet soda or water instead of juice or soda; eat more vegetables; eat foods that are low in salt; eat baked foods instead of fried foods; eat fruit for snacks and desserts  Be physically active find an activity I enjoy  Meeting treatment goals maintain the current self-care plan    No flowsheet data found.   Care Management & Community Referrals:  No flowsheet data found.

## 2015-10-29 NOTE — Progress Notes (Signed)
Patient ID: [name], female   DOB: 30-Apr-1951, 64 y.o.   MRN: [id number]    Subjective:   Patient ID: [name] female   DOB: May 31, 1951 64 y.o.   MRN: [id number]  HPI: [name] is a 64 y.o. woman with past medical history of insulin-dependent Type 2 DM, hypertension, hyperlipidemia, CKD Stage 3, hypothyroidism on replacement, vitamin D deficiency, PVD on pletal, CVA on ASA & plavix, grade 1 diastolic dysfunction, normocytic anemia, GERD, and past tobacco use who presents for follow-up of diabetes.   Her last A1c was 10.3 on 09/17/15. She checks her blood sugar 1-2 times daily and brought her glucose meter which reveals range of 157-367 with average of 238. She is compliant with taking glipizide XL 5 mg daily and Novolog mix 35 U BID. She did not tolerate metformin. She has chronic polyuria but denies symptomatic hypoglycemia, polyphagia, polydipsia, peripheral neuropathy, blurry vision, or recent foot injury/ulcer. She tries to follow a healthy diet and exercise regularly. She has gained 3 lb's since last visit 1 month ago.   She is compliant with taking losartan and HCTZ for hypertension. She denies lightheadedness, chest pain, headache, or LE edema.     Past Medical History  Diagnosis Date  . CVA (cerebral vascular accident) ([name]) 01/2011    affected rt side  . Diabetes mellitus   . Hypertension   . Arteriosclerotic cardiovascular disease   . Hypothyroidism   . Hypocholesterolemia   . Renal insufficiency   . Anemia   . Vitamin D deficiency   . Peripheral vascular disease ([name])   . GERD (gastroesophageal reflux disease)   . Claudication in peripheral vascular disease ([name])   . Hyperlipidemia    Current Outpatient Prescriptions  Medication Sig Dispense Refill  . aspirin EC 81 MG tablet Take 81 mg by mouth daily.    . cilostazol (PLETAL) 100 MG tablet Take 1 tablet (100 mg total) by mouth 2 (two) times daily. 180 tablet 3  . clopidogrel (PLAVIX) 75 MG tablet  Take 1 tablet (75 mg total) by mouth every morning. 90 tablet 3  . gemfibrozil (LOPID) 600 MG tablet Take 1 tablet (600 mg total) by mouth 2 (two) times daily. 60 tablet 2  . hydrochlorothiazide (HYDRODIURIL) 25 MG tablet Take 1 tablet (25 mg total) by mouth daily. 90 tablet 3  . insulin aspart protamine- aspart (NOVOLOG MIX 70/30) (70-30) 100 UNIT/ML injection Inject 0.36 mLs (36 Units total) into the skin 2 (two) times daily with a meal. (Patient taking differently: Inject 35 Units into the skin 2 (two) times daily with a meal. ) 10 mL 11  . Insulin Pen Needle 31G X 6 MM MISC Use to inject insulin twice daily 100 each 3  . levothyroxine (SYNTHROID) 88 MCG tablet Take 1 tablet (88 mcg total) by mouth daily before breakfast. 90 tablet 3  . losartan (COZAAR) 100 MG tablet Take 100 mg by mouth daily.    . ranitidine (ZANTAC) 150 MG tablet TAKE 1 TABLET BY MOUTH EVERY DAY AS NEEDED 30 tablet 4  . Vitamin D, Ergocalciferol, (DRISDOL) 50000 UNITS CAPS capsule Take 1 capsule (50,000 Units total) by mouth every 7 (seven) days. 8 capsule 0   No current facility-administered medications for this visit.   Family History  Problem Relation Age of Onset  . Diabetes Mother   . Diabetes Father   . Diabetes Sister   . Diabetes Brother   . Colon cancer Brother   . Esophageal cancer  Neg Hx   . Rectal cancer Neg Hx   . Stomach cancer Neg Hx    Social History   Social History  . Marital Status: Single    Spouse Name: N/A  . Number of Children: 1  . Years of Education: 10th- GED   Occupational History  . Retired    Social History Main Topics  . Smoking status: Former Smoker    Quit date: 02/04/2011  . Smokeless tobacco: Never Used  . Alcohol Use: No  . Drug Use: No  . Sexual Activity: Not Asked   Other Topics Concern  . None   Social History Narrative   Review of Systems: Review of Systems  Constitutional: Negative for weight loss.  Eyes: Negative for blurred vision.  Respiratory:  Positive for wheezing. Negative for cough and shortness of breath.   Cardiovascular: Negative for chest pain and leg swelling.  Gastrointestinal: Positive for heartburn and blood in stool (after taking ginseng ). Negative for nausea, vomiting, abdominal pain, diarrhea, constipation and melena.  Genitourinary: Negative for dysuria, urgency and frequency.  Musculoskeletal: Positive for neck pain.  Neurological: Positive for headaches. Negative for dizziness and sensory change.  Endo/Heme/Allergies: Negative for polydipsia.    Objective:  Physical Exam: Filed Vitals:   10/29/15 1439  BP: 137/42  Pulse: 81  Temp: 97.9 F (36.6 C)  TempSrc: Oral  Height: 5\' 2"  (1.575 m)  Weight: 185 lb 8 oz (84.142 kg)  SpO2: 99%    Physical Exam  Constitutional: She appears well-developed and well-nourished. No distress.  HENT:  Head: Normocephalic and atraumatic.  Right Ear: External ear normal.  Left Ear: External ear normal.  Nose: Nose normal.  Mouth/Throat: Oropharynx is clear and moist. No oropharyngeal exudate.  Eyes: Conjunctivae and EOM are normal. Pupils are equal, round, and reactive to light. Right eye exhibits no discharge. Left eye exhibits no discharge. No scleral icterus.  Neck: Normal range of motion. Neck supple.  Cardiovascular: Normal rate, regular rhythm and normal heart sounds.   Pulmonary/Chest: Effort normal and breath sounds normal. No respiratory distress. She has no wheezes. She has no rales.  Abdominal: Soft. Bowel sounds are normal. She exhibits no distension. There is no tenderness. There is no rebound and no guarding.  Musculoskeletal: Normal range of motion. She exhibits no edema or tenderness.  Neurological: She is alert.  Skin: Skin is warm and dry. No rash noted. She is not diaphoretic. No erythema. No pallor.  Psychiatric: She has a normal mood and affect. Her behavior is normal. Judgment and thought content normal.    Assessment & Plan:   Please see problem  list for problem-based assessment and plan

## 2015-10-30 MED ORDER — INSULIN ASPART PROT & ASPART (70-30 MIX) 100 UNIT/ML ~~LOC~~ SUSP: 35.0000 [IU] | Freq: Two times a day (BID) | SUBCUTANEOUS | Status: DC

## 2015-10-30 NOTE — Assessment & Plan Note (Signed)
Assessment: Pt with moderately well-controlled hypertension compliant with two-class (ARB & diuretic) anti-hypertensive therapy who presents with blood pressure of 137/42.   Plan:  -BP 137/42 at goal <140/90 -Continue HCTZ 25 mg daily and losartan 100 mg daily  -Last CMP on 08/11/15 with stable CKD Stage 3

## 2015-10-30 NOTE — Assessment & Plan Note (Addendum)
Assessment: Pt with last A1c of 10.3 on 09/17/15 compliant with oral hypoglycemic and insulin therapy with no recent symptomatic hypoglycemia who presents with blood glucose of 160.     Plan:  -A1c 10.3 not at goal <7, increase glipizide XL from 5 mg daily to BID. Continue Novolog (70/30) mix 35 U BID (declined increasing).   -BP 137/42 at goal <140/90, continue losartan 100 mg daily and HCTZ 25 mg daily  -LDL 56 at goal <100, continue gemfibrozil 600 mg BID for hypertriglyceridemia  -Last annual foot on 06/01/15  -Last annual eye exam on 06/01/15 with no retinopathy  -Last annual urine microalbumin with 87.2 mg of proteinuria, continue losartan 100 mg daily  -BMI 33.92 not at goal <25, encourage weight loss

## 2015-11-02 ENCOUNTER — Telehealth: Payer: Self-pay | Admitting: *Deleted

## 2015-11-02 NOTE — Telephone Encounter (Signed)
Received faxed documentation that [place] request for Novolog mix 70/30 for the 2017 benefit year has been denied.  The following information was not provided to insurance company:  1. Chart documentation /medical records reflecting that you have failed or have contraindication or intolerance to the preferred Humalog Mix 75-25 vial:  2. OR detailed clinica rationale as to why the abo ve formulary alternative would not be as effective for you or would cause adverse effects.    Attempted to contact pt to inform her that there may be a change in her insulin, pt not in, no message left. Will send info to pcp for review. Please advise  .[name]12/20/201611:51 AM

## 2015-11-02 NOTE — Progress Notes (Signed)
[facility] Attending  Case discussed with Dr. Rabbani soon after the resident saw the patient.  We reviewed the resident's history and exam and pertinent patient test results.  I agree with the assessment, diagnosis, and plan of care documented in the resident's note.  

## 2015-11-02 NOTE — Telephone Encounter (Signed)
Pt has documented adverse reaction to Humalog Mix 75/25 with rash listed on her allergy list on 12/27/14 so cannot take this form of insulin. Let me know if I need to state this in a letter, thanks!  Dr. Naaman Plummer

## 2015-11-03 ENCOUNTER — Other Ambulatory Visit: Payer: Self-pay | Admitting: Dietician

## 2015-11-03 DIAGNOSIS — N183 Chronic kidney disease, stage 3 unspecified: Secondary | ICD-10-CM

## 2015-11-03 DIAGNOSIS — E1122 Type 2 diabetes mellitus with diabetic chronic kidney disease: Secondary | ICD-10-CM

## 2015-11-03 MED ORDER — "INSULIN SYRINGE-NEEDLE U-100 31G X 15/64"" 0.5 ML MISC": Status: DC

## 2015-11-03 NOTE — Telephone Encounter (Signed)
Thanks so much [name] for your help!  Dr. Naaman Plummer

## 2015-11-03 NOTE — Telephone Encounter (Signed)
I called patient, she has a half of a vial left ( ~ 7 days worth) she needs syringes and says she does not want the ones that hold 50 units but the ones that she is currently using that hold 30-35 units. She confirmed her dose of Novolog Mix 70/30 is 35 units twice daily.  CDE told her that the ones that hold 30 units are not big enough for her dose, she says she is not at home right now to be able to look at them, We agreed to order the 50 unit syringes for now, she will call if she cannot get a refill on her insulin and she agreed to an appointment with CDE on [phone] to review her diabetes self care.   CDE called her pharmacy to see if she could get a refill before the endo f the year: it is too early for a refill but the pharmacist agreed to run it tomorrow and call us to let us know if it goes through.  In the meantime we have samples of Novolog Mix 70/30 if needed.

## 2015-11-03 NOTE — Telephone Encounter (Signed)
Needs rx for syrintges

## 2015-11-04 NOTE — Telephone Encounter (Signed)
Great news, thank you Regino Schultze and [name]!  Dr. Naaman Plummer

## 2015-11-04 NOTE — Telephone Encounter (Addendum)
Nurse reports that pharmacy told her that Ms. Mccort's insulin went through for 3$. Called Ms. Kaczmarczyk and notified her, she says the pharmacy called and told her and they also said her syringes were there for her to pick up as well. Encouraged [patient to call if she every has a problem obtaining her medicine or supplies. Patient confirms that she breaks out in hives when she takes Humalog Mix 75/25

## 2015-11-08 ENCOUNTER — Other Ambulatory Visit: Payer: Self-pay | Admitting: Internal Medicine

## 2015-11-09 ENCOUNTER — Other Ambulatory Visit: Payer: Self-pay | Admitting: Internal Medicine

## 2015-11-09 NOTE — Telephone Encounter (Signed)
Pt requesting blood pressure med to be filled. °

## 2015-11-10 MED ORDER — LOSARTAN POTASSIUM 100 MG PO TABS: 100.0000 mg | ORAL_TABLET | Freq: Every day | ORAL | Status: DC

## 2015-11-16 ENCOUNTER — Telehealth: Payer: Self-pay | Admitting: *Deleted

## 2015-11-16 NOTE — Telephone Encounter (Signed)
Sample provided to patient and Appeal form completed and faxed to [employer] [facility].[name]1/3/20174:42 PM

## 2015-11-16 NOTE — Telephone Encounter (Signed)
Pt here with paperwork from [facility] (dated 12/19) stating that her Novolog Mix 70/30 had been denied.  Pt reports allergy to the preferred Humalog 75/25 Mix.  Will send an appeal to insurance company, but pt is almost out of insulin and is requesting a sample on the Novolog Mix 70/30. Will send to attending for review, please advise.[name] Hidden Cassady1/3/20172:55 PM

## 2015-11-16 NOTE — Telephone Encounter (Signed)
OK for sample to be given.

## 2015-11-19 ENCOUNTER — Other Ambulatory Visit: Payer: Self-pay | Admitting: Internal Medicine

## 2015-11-19 ENCOUNTER — Ambulatory Visit (INDEPENDENT_AMBULATORY_CARE_PROVIDER_SITE_OTHER): Payer: Medicare Other | Admitting: Dietician

## 2015-11-19 VITALS — Wt 183.6 lb

## 2015-11-19 DIAGNOSIS — N183 Chronic kidney disease, stage 3 unspecified: Secondary | ICD-10-CM

## 2015-11-19 DIAGNOSIS — E1165 Type 2 diabetes mellitus with hyperglycemia: Secondary | ICD-10-CM | POA: Diagnosis not present

## 2015-11-19 DIAGNOSIS — Z7984 Long term (current) use of oral hypoglycemic drugs: Secondary | ICD-10-CM

## 2015-11-19 DIAGNOSIS — Z713 Dietary counseling and surveillance: Secondary | ICD-10-CM

## 2015-11-19 DIAGNOSIS — E1122 Type 2 diabetes mellitus with diabetic chronic kidney disease: Secondary | ICD-10-CM

## 2015-11-19 DIAGNOSIS — Z794 Long term (current) use of insulin: Secondary | ICD-10-CM | POA: Diagnosis not present

## 2015-11-19 NOTE — Patient Instructions (Addendum)
Please check your blood sugar when you wake up hungry earlier than usual before you eat..   Try to eat 2 servings of fruit a day  Eat at least 3-5 servings of vegetables each day.   Use OLD fashioned oatmeal instead of grits  Eat those dried beans!!! Can get rid of hot gods and bologna, sausage and bacon by using to season bens. When it is gone,  season the beans with canola oil and seasonings like pepper, onion, garlic, chili powder and cumin    Decrease or stop the vitamin C pills- it is better to get vitamins C from oranges, potatoes.   Stop the sausage, bacon, hot dogs and bologna  Instead of bologna- use [place], ham, peanut butter Instead of sausage and bacon- salmon cakes or any kind of fish, or fruit

## 2015-11-19 NOTE — Progress Notes (Signed)
  Medical Nutrition Therapy:  Appt start time: [id number] end time:  1200. Visit #2 last visit was 06/2015- 4-5 months ago  Assessment:  Primary concerns today: glycemic control   She is trying to eat as healthy as possible on her limited budget of 40$ per month. Has been snacking on carrots.  Had cut out fruits, dried beans, potatoes. Reports problems with constipation. Limits starches. Does not go out into the sun, does not drink milk as it was too much of her budget.  She uses pantries t have enough food, Did not sign up for food stamps  because it was only 15 $. She now goes to church group and eats twice daily there twice a week. She is weight conscious and would like to weigh 170#.   Needs something to help decrease her insulin resistance.  She does not take metformin.  Has never had any low blood sugars.    Meter Download: average for past 30 days is 230, blood sugar have improved since last visit Lowest is 163 to 282. She mostly cheks her blood sugar in the am about every other day.  SLEEP; has trouble with sleep due to urinating so often Labs: Vit d note at 15 Ng, A1C improved to 10.3% with triglycerides 585   Weight is stable ~ 180# MEDICATIONS: takes insulin and glipizide without fail. Also takes 1000 mg vitamin C two pills a day- advised her to decrease this to no more than 1/2 pill daily or every other day.    DIETARY INTAKE: Usual eating pattern includes 2 meals and reports no snacks, few sweets. Everyday foods include chicken, okra, beans, bologna, mayonnaise, soymilk, ice cream.  Avoided foods include fruit and regular milk   24hr recall:  B ( 5AM): sometimes eats here instead of 7 am usually a sandwich or hot dog, water L ( 7 PM): grits eggs, bacon or sausage, water Snk (2-3 PM): bologna sandwich with mayo, water D ( 7 PM): boiled chicken, okra stewed, bread, brown rice Beverages: water, juice  Usual physical activity: treadmill 5 minutes a day, stationary bike- 30 minutes a day,  cleans and does ADLs and watches TV for 6 hours a day  Recommend moderate carb, healthy fat, high fiber protein diet for weight loss to help with insulin resistance 1400-1500 calories 165-170 g carbohydrates 85-95 g protein 50 g fat  Progress Towards Goal(s):  In progress.   Nutritional Diagnosis:  NB-1.1 Food and nutrition-related knowledge deficit As related to lack of prior adeuqate diabetes meal planning.  As evidenced by her report and lack of knoweldge about fats and carbohydrates.    Intervention:  Nutrition education about healthy meal planning, will try to help her gradually increase fiber and omega 3s and monounsat fats, decrease sat fats. Processed foods Teaching Method Utilized: Visual, Auditory,Hands on Handouts given during visit include: AVS, What to eat heandout Barriers to learning/adherence to lifestyle change: very limited finances Demonstrated degree of understanding via:  Teach Back   Monitoring/Evaluation:  Dietary intake, exercise, meter, and body weight in 6 week(s)  Per pt request.

## 2015-12-07 ENCOUNTER — Telehealth: Payer: Self-pay | Admitting: Internal Medicine

## 2015-12-07 NOTE — Telephone Encounter (Signed)
CALLED TO CONFIRM APPT TIME

## 2015-12-24 NOTE — Telephone Encounter (Signed)
Follow up call made to pt and pharmacy-medication was approved.[name]2/10/20179:47 AM

## 2016-01-07 ENCOUNTER — Telehealth: Payer: Self-pay | Admitting: Dietician

## 2016-01-07 NOTE — Telephone Encounter (Signed)
Called patient  to help her to  lower her blood sugars to target. Left voicemail message for return call. Suggested she schedule an appointment with Dr. Naaman Plummer on same day she has an appointment with me in March.

## 2016-01-11 ENCOUNTER — Encounter: Payer: Self-pay | Admitting: Internal Medicine

## 2016-01-11 ENCOUNTER — Ambulatory Visit (INDEPENDENT_AMBULATORY_CARE_PROVIDER_SITE_OTHER): Payer: Medicare Other | Admitting: Internal Medicine

## 2016-01-11 VITALS — BP 162/59 | HR 72 | Temp 98.2°F | Wt 181.3 lb

## 2016-01-11 DIAGNOSIS — N183 Chronic kidney disease, stage 3 unspecified: Secondary | ICD-10-CM

## 2016-01-11 DIAGNOSIS — E1165 Type 2 diabetes mellitus with hyperglycemia: Secondary | ICD-10-CM | POA: Diagnosis not present

## 2016-01-11 DIAGNOSIS — I129 Hypertensive chronic kidney disease with stage 1 through stage 4 chronic kidney disease, or unspecified chronic kidney disease: Secondary | ICD-10-CM | POA: Diagnosis not present

## 2016-01-11 DIAGNOSIS — Z794 Long term (current) use of insulin: Secondary | ICD-10-CM

## 2016-01-11 DIAGNOSIS — E1142 Type 2 diabetes mellitus with diabetic polyneuropathy: Secondary | ICD-10-CM | POA: Diagnosis not present

## 2016-01-11 DIAGNOSIS — E1122 Type 2 diabetes mellitus with diabetic chronic kidney disease: Secondary | ICD-10-CM | POA: Diagnosis not present

## 2016-01-11 DIAGNOSIS — Z7984 Long term (current) use of oral hypoglycemic drugs: Secondary | ICD-10-CM

## 2016-01-11 DIAGNOSIS — I10 Essential (primary) hypertension: Secondary | ICD-10-CM

## 2016-01-11 LAB — POCT GLYCOSYLATED HEMOGLOBIN (HGB A1C): Hemoglobin A1C: 10.4

## 2016-01-11 LAB — GLUCOSE, CAPILLARY: Glucose-Capillary: 190 mg/dL — ABNORMAL HIGH (ref 65–99)

## 2016-01-11 MED ORDER — HYDROCHLOROTHIAZIDE 25 MG PO TABS: 25.0000 mg | ORAL_TABLET | Freq: Every day | ORAL | Status: DC

## 2016-01-11 NOTE — Progress Notes (Signed)
Patient ID: [name], female   DOB: 04/16/1951, 65 y.o.   MRN: [id number]    Subjective:   Patient ID: [name] female   DOB: [birth year] 65 y.o.   MRN: [id number]  HPI: [name] is a 65 y.o. woman with past medical history of insulin-dependent Type 2 DM, hypertension, hyperlipidemia, CKD Stage 3, hypothyroidism on replacement, vitamin D deficiency, PVD on pletal, CVA on ASA & plavix, grade 1 diastolic dysfunction, normocytic anemia, GERD, and past tobacco use who presents for follow-up of diabetes.   Her last A1c was 10.3 on 09/17/15. She checks her blood sugar occasionally and brought her glucose meter which reveals range of 226-352 with average of 282. She was supposed to be taking glipizide XL 5 mg BID but due to itching has been taking it daily as well as Novolog mix 35 U BID which she refuses to increase. She did not tolerate metformin in the past. She has chronic polyuria and chronic peripheral neuropathy but denies symptomatic hypoglycemia, polyphagia, polydipsia, blurry vision, or recent foot injury/ulcer. She tries to follow a healthy diet and exercise regularly. She has lost 4 lb since last visit 2 months ago.   She is compliant with taking losartan but not HCTZ for hypertension. She has LE edema but denies lightheadedness, chest pain, or headache.      Past Medical History  Diagnosis Date  . CVA (cerebral vascular accident) ([place]) 01/2011    affected rt side  . Diabetes mellitus   . Hypertension   . Arteriosclerotic cardiovascular disease   . Hypothyroidism   . Hypocholesterolemia   . Renal insufficiency   . Anemia   . Vitamin D deficiency   . Peripheral vascular disease ([place])   . GERD (gastroesophageal reflux disease)   . Claudication in peripheral vascular disease ([place])   . Hyperlipidemia    Current Outpatient Prescriptions  Medication Sig Dispense Refill  . aspirin EC 81 MG tablet Take 81 mg by mouth daily.    . cilostazol (PLETAL) 100 MG tablet Take  1 tablet (100 mg total) by mouth 2 (two) times daily. 180 tablet 3  . clopidogrel (PLAVIX) 75 MG tablet Take 1 tablet (75 mg total) by mouth every morning. 90 tablet 3  . gemfibrozil (LOPID) 600 MG tablet TAKE 1 TABLET BY MOUTH TWICE A DAY 60 tablet 5  . glipiZIDE (GLUCOTROL XL) 5 MG 24 hr tablet Take 5 mg by mouth 2 (two) times daily.    . hydrochlorothiazide (HYDRODIURIL) 25 MG tablet Take 1 tablet (25 mg total) by mouth daily. 90 tablet 3  . insulin aspart protamine- aspart (NOVOLOG MIX 70/30) (70-30) 100 UNIT/ML injection Inject 0.35 mLs (35 Units total) into the skin 2 (two) times daily with a meal. 10 mL 11  . Insulin Pen Needle 31G X 6 MM MISC Use to inject insulin twice daily 100 each 3  . Insulin Syringe-Needle U-100 31G X 15/64" 0.5 ML MISC Use to inject insulin two times a day 100 each 11  . levothyroxine (SYNTHROID) 88 MCG tablet Take 1 tablet (88 mcg total) by mouth daily before breakfast. 90 tablet 3  . losartan (COZAAR) 100 MG tablet Take 1 tablet (100 mg total) by mouth daily. 90 tablet 3  . ranitidine (ZANTAC) 150 MG tablet TAKE 1 TABLET BY MOUTH EVERY DAY AS NEEDED 30 tablet 4   No current facility-administered medications for this visit.   Family History  Problem Relation Age of Onset  . Diabetes Mother   .  Diabetes Father   . Diabetes Sister   . Diabetes Brother   . Colon cancer Brother   . Esophageal cancer Neg Hx   . Rectal cancer Neg Hx   . Stomach cancer Neg Hx    Social History   Social History  . Marital Status: Single    Spouse Name: N/A  . Number of Children: 1  . Years of Education: 10th- GED   Occupational History  . Retired    Social History Main Topics  . Smoking status: Former Smoker    Quit date: 02/04/2011  . Smokeless tobacco: Never Used  . Alcohol Use: No  . Drug Use: No  . Sexual Activity: Not Asked   Other Topics Concern  . None   Social History Narrative   Review of Systems: Review of Systems  Constitutional: Positive for  weight loss. Negative for fever and chills.  Eyes: Negative for blurred vision.  Respiratory: Positive for shortness of breath (with exertion). Negative for cough and wheezing.   Cardiovascular: Positive for claudication (on petal ) and leg swelling (b/l LE). Negative for chest pain.  Gastrointestinal: Positive for constipation. Negative for nausea, vomiting, abdominal pain, diarrhea and blood in stool.  Genitourinary: Negative for dysuria, urgency, frequency and hematuria.       Urinary incontinence   Musculoskeletal: Negative for myalgias.  Neurological: Positive for sensory change (chronic peripheral neuropathy ) and headaches. Negative for dizziness.  Endo/Heme/Allergies: Negative for polydipsia.     Objective:  Physical Exam: Filed Vitals:   01/11/16 1038  BP: 162/59  Pulse: 72  Temp: 98.2 F (36.8 C)  TempSrc: Oral  Weight: 181 lb 4.8 oz (82.237 kg)  SpO2: 99%    Physical Exam  Constitutional: She is oriented to person, place, and time. She appears well-developed and well-nourished. No distress.  HENT:  Head: Normocephalic and atraumatic.  Right Ear: External ear normal.  Left Ear: External ear normal.  Nose: Nose normal.  Mouth/Throat: Oropharynx is clear and moist. No oropharyngeal exudate.  Eyes: Conjunctivae and EOM are normal. Pupils are equal, round, and reactive to light. Right eye exhibits no discharge. Left eye exhibits no discharge. No scleral icterus.  Neck: Normal range of motion. Neck supple.  Cardiovascular: Normal rate, regular rhythm and normal heart sounds.   Pulmonary/Chest: Effort normal and breath sounds normal. No respiratory distress. She has no wheezes. She has no rales.  Abdominal: Soft. Bowel sounds are normal. She exhibits no distension. There is no tenderness. There is no rebound and no guarding.  Musculoskeletal: Normal range of motion. She exhibits edema (trace b/ l LE). She exhibits no tenderness.  Neurological: She is alert and oriented to  person, place, and time.  Skin: Skin is warm and dry. No rash noted. She is not diaphoretic. No erythema. No pallor.  Psychiatric: She has a normal mood and affect. Her behavior is normal. Judgment and thought content normal.    Assessment & Plan:   Please see problem list for problem-based assessment and plan

## 2016-01-11 NOTE — Assessment & Plan Note (Signed)
Assessment: Pt with moderately well-controlled hypertension partially compliant with two-class (ARB & diuretic) anti-hypertensive therapy who presents with blood pressure of 162/59.   Plan:  -BP 162/59 not at goal <140/90 however not taking HCTZ due to running out -Refill HCTZ 25 mg daily and continue losartan 100 mg daily  -Last CMP on 08/11/15 with stable CKD Stage 3, repeat at next visit

## 2016-01-11 NOTE — Assessment & Plan Note (Addendum)
Assessment: Pt with last A1c of 10.3 on 09/17/15 partially compliant with oral hypoglycemic and insulin therapy with no recent hypoglycemic episodes who presents with CBG of 190 and unchanged A1c of 10.4    Plan:  -A1c 10.4 not at goal <7, pt refuses to increase glipizide XL from 5 mg daily to BID or to increase Novolog (70/30) mix 35 U BID to 37 U BID. Pt to meet with Barry Brunner on 01/21/16. Consider GLP-1 agonist with wt loss benefit.  -BP 162/59 not at goal <140/90, pt not taking HCTZ 25 mg daily, refilled today and continue losartan 100 mg daily   -LDL 56 at goal <100, continue gemfibrozil 600 mg BID for hypertriglyceridemia  -Last annual foot on 06/01/15  -Last annual eye exam on 06/01/15 with no retinopathy  -Last annual urine microalbumin on 06/01/15 with 87.2 mg of proteinuria, continue losartan 100 mg daily  -BMI 33.15 not at goal <25, encourage weight loss

## 2016-01-11 NOTE — Patient Instructions (Signed)
-  Your A1c is 10.4 which has not improved from last time. I recommend you increase your Novolog Mix from 35 U twice a day to 37 U twice a day and increase your glipizide to 5 mg twice a day  -I refilled the HCTZ 25 mg daily for high blood pressure to your pharmacy -Will check your bloodwork next time  -Please come back in 2-3 months for your diabetes, very nice seeing you!  General Instructions:   Thank you for bringing your medicines today. This helps [place] keep you safe from mistakes.   Progress Toward Treatment Goals:  No flowsheet data found.  Self Care Goals & Plans:  Self Care Goal 10/29/2015  Manage my medications take my medicines as prescribed; bring my medications to every visit; refill my medications on time  Monitor my health keep track of my blood glucose; bring my glucose meter and log to each visit  Eat healthy foods drink diet soda or water instead of juice or soda; eat more vegetables; eat foods that are low in salt; eat baked foods instead of fried foods; eat fruit for snacks and desserts  Be physically active find an activity I enjoy  Meeting treatment goals maintain the current self-care plan    No flowsheet data found.   Care Management & Community Referrals:  No flowsheet data found.

## 2016-01-14 NOTE — Progress Notes (Signed)
[facility] Attending  Case discussed with Dr. Rabbani soon after the resident saw the patient.  We reviewed the resident's history and exam and pertinent patient test results.  I agree with the assessment, diagnosis, and plan of care documented in the resident's note.  

## 2016-01-21 ENCOUNTER — Other Ambulatory Visit: Payer: Self-pay | Admitting: Dietician

## 2016-01-21 ENCOUNTER — Ambulatory Visit (INDEPENDENT_AMBULATORY_CARE_PROVIDER_SITE_OTHER): Payer: Medicare Other | Admitting: Dietician

## 2016-01-21 DIAGNOSIS — Z713 Dietary counseling and surveillance: Secondary | ICD-10-CM | POA: Diagnosis not present

## 2016-01-21 DIAGNOSIS — E1165 Type 2 diabetes mellitus with hyperglycemia: Secondary | ICD-10-CM | POA: Diagnosis not present

## 2016-01-21 DIAGNOSIS — E1122 Type 2 diabetes mellitus with diabetic chronic kidney disease: Secondary | ICD-10-CM

## 2016-01-21 DIAGNOSIS — N183 Chronic kidney disease, stage 3 unspecified: Secondary | ICD-10-CM

## 2016-01-21 DIAGNOSIS — Z794 Long term (current) use of insulin: Secondary | ICD-10-CM

## 2016-01-21 LAB — GLUCOSE, CAPILLARY: Glucose-Capillary: 179 mg/dL — ABNORMAL HIGH (ref 65–99)

## 2016-01-21 NOTE — Progress Notes (Signed)
  Medical Nutrition Therapy:  Appt start time: 1000 end time:  1150. Visit #3 last visit was 11/2015  Assessment:  Primary concerns today: glycemic control  Ms. Abebe did not bring her meter today. Does not want anymore injections, but is willing to change her lifestyle and take more pills for her blood sugars. . She is not happy with her weight; would like to weigh 170#. Says she cannot get full with small portions or without meat. Thinks she eats more because of her insulin.  She is insulin resistance.  She cannot take metformin.  Has never had any low blood sugars. Did not like januvia, Humalog or Trulicity   Meter Download: average for past 30 days is 230, blood sugar have improved since last visit Lowest is 163 to 282. She mostly cheks her blood sugar in the am about every other day.  SLEEP; got up 3 times last night to urinate Labs: Vit d note at 15 Ng, A1C staying ~ 10.4% with triglycerides 585   Weight is stable ~ 180-181## MEDICATIONS: takes insulin and glipizide without fail. stopped vitamin C.    DIETARY INTAKE: Usual eating pattern includes 2 meals and bedtime snack. Everyday foods include chicken, okra, beans, mayonnaise, fruit, soymilk, ice cream.  Avoided foods include regular milk   24hr recall:  B ( 645 AM): takes insulin before eating L ( 7 PM): 2 boiled eggs, water Snk (2-3 PM): banana, orange, water D ( 7 PM): boiled chicken, okra stewed, beans Bedtime snack- large (~ 3-4 cups cereal  with soymilk) Beverages: water, juice  Usual physical activity: treadmill 5 minutes a day, stationary bike- 5 minutes, cleans and does ADLs and watches TV for 6 hours a day  Recommend moderate carb, healthy fat, high fiber protein diet for weight loss to help with insulin resistance 1400-1500 calories 165-170 g carbohydrates 85-95 g protein 50 g fat  Progress Towards Goal(s):  In progress.   Nutritional Diagnosis:  NB-1.1 Food and nutrition-related knowledge deficit As related to  lack of prior adeuqate diabetes meal planning is improving  As evidenced by her report and lower blood sugar at today's visit.    Intervention:  Nutrition education about healthy meal planning for diabetes, CKD and blood pressure, strongly encouragedill try to help her gradually increased fiber and omega 3s and monounsat fats, decrease sat fats. Assisted her with increasing her knowledge of diabetes medicine.  Teaching Method Utilized: Visual, Auditory,Hands on Handouts given during visit include: AVS, What to eat heandout Barriers to learning/adherence to lifestyle change: very limited finances Demonstrated degree of understanding via:  Teach Back   Monitoring/Evaluation:  Dietary intake, exercise, meter, and body weight prn  Per pt request.- she will schedule on same day she sees Dr. Naaman Plummer

## 2016-01-21 NOTE — Patient Instructions (Signed)
Your plan to lower your blood pressure and blood sugar is to:  Eat more seeds, fruits and vegetables, beans, barley and quinoa  Take a walk every day  More plant protein and fish- 3 times a week  It would be great to have your meter and more blood sugar in the evening.   I will mention the acarbose to Dr. Naaman Plummer

## 2016-01-24 NOTE — Addendum Note (Signed)
Addended byJuluis Mire on: 01/24/2016 10:21 AM   Modules accepted: Orders

## 2016-01-25 ENCOUNTER — Telehealth: Payer: Self-pay | Admitting: Dietician

## 2016-01-26 NOTE — Progress Notes (Signed)
Correction: Start time for this visit was was 11:00 AM

## 2016-01-27 ENCOUNTER — Telehealth: Payer: Self-pay | Admitting: Dietician

## 2016-01-27 ENCOUNTER — Other Ambulatory Visit: Payer: Self-pay | Admitting: Internal Medicine

## 2016-01-27 DIAGNOSIS — N183 Chronic kidney disease, stage 3 unspecified: Secondary | ICD-10-CM

## 2016-01-27 DIAGNOSIS — E1122 Type 2 diabetes mellitus with diabetic chronic kidney disease: Secondary | ICD-10-CM

## 2016-01-27 MED ORDER — LINAGLIPTIN 5 MG PO TABS: 5.0000 mg | ORAL_TABLET | Freq: Every day | ORAL | Status: DC

## 2016-01-27 NOTE — Telephone Encounter (Signed)
Called patient and informed her that a new diabetes medicine was sent to her pharmacy. Educated her about this medicine and to call if she has any blood sugar less than 70 mg/dl. (she says her blood sugar was 89 since exercising and eating salads.)

## 2016-01-27 NOTE — Telephone Encounter (Signed)
Thanks [name] for calling her!  Dr. Naaman Plummer

## 2016-01-31 NOTE — Telephone Encounter (Signed)
opened in error as I have already documented in another note that patient was called and informed and educated about her new diabetes medicine

## 2016-02-04 ENCOUNTER — Telehealth: Payer: Self-pay | Admitting: Dietician

## 2016-02-04 NOTE — Telephone Encounter (Signed)
She was driving at time of call and asked to be called back in a few hours. Unable to reach patient when trying later in day.

## 2016-03-10 ENCOUNTER — Emergency Department (HOSPITAL_COMMUNITY)
Admission: EM | Admit: 2016-03-10 | Discharge: 2016-03-10 | Disposition: A | Discharge status: Home / Self Care | Payer: Medicare Other | Attending: Emergency Medicine | Admitting: Emergency Medicine

## 2016-03-10 ENCOUNTER — Encounter (HOSPITAL_COMMUNITY): Payer: Self-pay

## 2016-03-10 ENCOUNTER — Other Ambulatory Visit: Payer: Self-pay | Admitting: Internal Medicine

## 2016-03-10 DIAGNOSIS — I2510 Atherosclerotic heart disease of native coronary artery without angina pectoris: Secondary | ICD-10-CM | POA: Diagnosis not present

## 2016-03-10 DIAGNOSIS — N189 Chronic kidney disease, unspecified: Secondary | ICD-10-CM | POA: Diagnosis not present

## 2016-03-10 DIAGNOSIS — K219 Gastro-esophageal reflux disease without esophagitis: Secondary | ICD-10-CM | POA: Insufficient documentation

## 2016-03-10 DIAGNOSIS — Z87448 Personal history of other diseases of urinary system: Secondary | ICD-10-CM | POA: Insufficient documentation

## 2016-03-10 DIAGNOSIS — I129 Hypertensive chronic kidney disease with stage 1 through stage 4 chronic kidney disease, or unspecified chronic kidney disease: Secondary | ICD-10-CM | POA: Insufficient documentation

## 2016-03-10 DIAGNOSIS — E1122 Type 2 diabetes mellitus with diabetic chronic kidney disease: Secondary | ICD-10-CM | POA: Diagnosis not present

## 2016-03-10 DIAGNOSIS — Z794 Long term (current) use of insulin: Secondary | ICD-10-CM | POA: Insufficient documentation

## 2016-03-10 DIAGNOSIS — Z8673 Personal history of transient ischemic attack (TIA), and cerebral infarction without residual deficits: Secondary | ICD-10-CM | POA: Insufficient documentation

## 2016-03-10 DIAGNOSIS — E1165 Type 2 diabetes mellitus with hyperglycemia: Secondary | ICD-10-CM | POA: Diagnosis not present

## 2016-03-10 DIAGNOSIS — N183 Chronic kidney disease, stage 3 (moderate): Secondary | ICD-10-CM

## 2016-03-10 DIAGNOSIS — I739 Peripheral vascular disease, unspecified: Secondary | ICD-10-CM | POA: Diagnosis not present

## 2016-03-10 DIAGNOSIS — E7800 Pure hypercholesterolemia, unspecified: Secondary | ICD-10-CM | POA: Diagnosis not present

## 2016-03-10 DIAGNOSIS — Z9889 Other specified postprocedural states: Secondary | ICD-10-CM | POA: Insufficient documentation

## 2016-03-10 DIAGNOSIS — E039 Hypothyroidism, unspecified: Secondary | ICD-10-CM | POA: Insufficient documentation

## 2016-03-10 DIAGNOSIS — E785 Hyperlipidemia, unspecified: Secondary | ICD-10-CM | POA: Diagnosis not present

## 2016-03-10 DIAGNOSIS — Z7902 Long term (current) use of antithrombotics/antiplatelets: Secondary | ICD-10-CM | POA: Diagnosis not present

## 2016-03-10 DIAGNOSIS — Z87891 Personal history of nicotine dependence: Secondary | ICD-10-CM | POA: Diagnosis not present

## 2016-03-10 DIAGNOSIS — Z7982 Long term (current) use of aspirin: Secondary | ICD-10-CM | POA: Diagnosis not present

## 2016-03-10 LAB — CBG MONITORING, ED
Glucose-Capillary: 345 mg/dL — ABNORMAL HIGH (ref 65–99)
Glucose-Capillary: 277 mg/dL — ABNORMAL HIGH (ref 65–99)

## 2016-03-10 LAB — CBC
HCT: 34.1 % — ABNORMAL LOW (ref 36.0–46.0)
Hemoglobin: 11 g/dL — ABNORMAL LOW (ref 12.0–15.0)
MCH: 28.4 pg (ref 26.0–34.0)
MCHC: 32.3 g/dL (ref 30.0–36.0)
MCV: 87.9 fL (ref 78.0–100.0)
PLATELETS: 229 10*3/uL (ref 150–400)
RBC: 3.88 MIL/uL (ref 3.87–5.11)
RDW: 14.4 % (ref 11.5–15.5)
WBC: 5 10*3/uL (ref 4.0–10.5)

## 2016-03-10 LAB — BASIC METABOLIC PANEL
Anion gap: 13 (ref 5–15)
BUN: 32 mg/dL — AB (ref 6–20)
CALCIUM: 9.9 mg/dL (ref 8.9–10.3)
CHLORIDE: 103 mmol/L (ref 101–111)
CO2: 20 mmol/L — AB (ref 22–32)
CREATININE: 1.49 mg/dL — AB (ref 0.44–1.00)
GFR calc Af Amer: 42 mL/min — ABNORMAL LOW (ref >60)
GFR calc non Af Amer: 36 mL/min — ABNORMAL LOW (ref >60)
GLUCOSE: 356 mg/dL — AB (ref 65–99)
Potassium: 4.2 mmol/L (ref 3.5–5.1)
Sodium: 136 mmol/L (ref 135–145)

## 2016-03-10 MED ORDER — INSULIN ASPART PROT & ASPART (70-30 MIX) 100 UNIT/ML ~~LOC~~ SUSP: 35.0000 [IU] | Freq: Two times a day (BID) | SUBCUTANEOUS | Status: DC

## 2016-03-10 MED ORDER — INSULIN ASPART PROT & ASPART (70-30 MIX) 100 UNIT/ML ~~LOC~~ SUSP
36.0000 [IU] | Freq: Once | SUBCUTANEOUS | Status: AC
  Administered 2016-03-10: 36 [IU] via SUBCUTANEOUS
  Filled 2016-03-10: qty 10

## 2016-03-10 NOTE — ED Notes (Signed)
Pt departed in NAD. Gave sandwich upon departure since had just given novolog.

## 2016-03-10 NOTE — ED Provider Notes (Signed)
CSN: [id number]     Arrival date & time 03/10/16  1719 History   First MD Initiated Contact with Patient 03/10/16 1858     Chief Complaint  Patient presents with  . Hyperglycemia  . Medication Refill    HPI Pt is a 65 y.o. female with h/o DM, CVA, HTN, PAD, CKD who presents for hyperglycemia after running out of her meds today. Pt reports that she took her morning dose of 35u 70/30 and then hasn't eaten or taken her insulin again because she is out. She called her PCP for a refill but they didn't call it in. She is going out of town for a wedding and needs her meds before she leaves. She also reports some hand pain for the past month or so but does not think this is related and plans to talk to her PCP about it. She denies any other symptoms currently. No CP, SOB, new edema, polyuria, dysuria.  Past Medical History  Diagnosis Date  . CVA (cerebral vascular accident) ([place]) 01/2011    affected rt side  . Diabetes mellitus   . Hypertension   . Arteriosclerotic cardiovascular disease   . Hypothyroidism   . Hypocholesterolemia   . Renal insufficiency   . Anemia   . Vitamin D deficiency   . Peripheral vascular disease ([place])   . GERD (gastroesophageal reflux disease)   . Claudication in peripheral vascular disease ([place])   . Hyperlipidemia    Past Surgical History  Procedure Laterality Date  . Tubal ligation    . Cesarean section    . Left heart catheterization with coronary angiogram N/A 03/08/2012    Procedure: LEFT HEART CATHETERIZATION WITH CORONARY ANGIOGRAM;  Surgeon: Sinclair Grooms, MD;  Location: [facility] CATH LAB;  Service: Cardiovascular;  Laterality: N/A;  . Lower extremity angiogram N/A 12/17/2012    Procedure: LOWER EXTREMITY ANGIOGRAM;  Surgeon: Laverda Page, MD;  Location: [facility] CATH LAB;  Service: Cardiovascular;  Laterality: N/A;  . Left heart catheterization with coronary angiogram N/A 12/17/2012    Procedure: LEFT HEART CATHETERIZATION WITH CORONARY ANGIOGRAM;  Surgeon:  Laverda Page, MD;  Location[facility] LAB;  Service: Cardiovascular;  Laterality: N/A;   Family History  Problem Relation Age of Onset  . Diabetes Mother   . Diabetes Father   . Diabetes Sister   . Diabetes Brother   . Colon cancer Brother   . Esophageal cancer Neg Hx   . Rectal cancer Neg Hx   . Stomach cancer Neg Hx    Social History  Substance Use Topics  . Smoking status: Former Smoker    Quit date: 02/04/2011  . Smokeless tobacco: Never Used  . Alcohol Use: No   OB History    No data available     Review of Systems  All other systems reviewed and are negative.   See HPI  Allergies  Insulins  Home Medications   Prior to Admission medications   Medication Sig Start Date End Date Taking? Authorizing Provider  aspirin EC 81 MG tablet Take 81 mg by mouth daily.    Historical Provider, MD  cilostazol (PLETAL) 100 MG tablet Take 1 tablet (100 mg total) by mouth 2 (two) times daily. 07/01/15   Juluis Mire, MD  clopidogrel (PLAVIX) 75 MG tablet Take 1 tablet (75 mg total) by mouth every morning. 07/20/15   Juluis Mire, MD  gemfibrozil (LOPID) 600 MG tablet TAKE 1 TABLET BY MOUTH TWICE A DAY 11/10/15   Marjan  Rabbani, MD  glipiZIDE (GLUCOTROL XL) 5 MG 24 hr tablet Take 5 mg by mouth daily with breakfast.     Historical Provider, MD  hydrochlorothiazide (HYDRODIURIL) 25 MG tablet Take 1 tablet (25 mg total) by mouth daily. Patient not taking: Reported on 01/21/2016 01/11/16   Juluis Mire, MD  insulin aspart protamine- aspart (NOVOLOG MIX 70/30) (70-30) 100 UNIT/ML injection Inject 0.35 mLs (35 Units total) into the skin 2 (two) times daily with a meal. 03/10/16   Frazier Richards, MD  Insulin Pen Needle 31G X 6 MM MISC Use to inject insulin twice daily 07/01/15   Juluis Mire, MD  Insulin Syringe-Needle U-100 31G X 15/64" 0.5 ML MISC Use to inject insulin two times a day 11/03/15   Juluis Mire, MD  levothyroxine (SYNTHROID) 88 MCG tablet Take 1 tablet (88 mcg total)  by mouth daily before breakfast. 09/09/15   Juluis Mire, MD  linagliptin (TRADJENTA) 5 MG TABS tablet Take 1 tablet (5 mg total) by mouth daily. 01/27/16   Juluis Mire, MD  losartan (COZAAR) 100 MG tablet Take 1 tablet (100 mg total) by mouth daily. 11/10/15   Juluis Mire, MD  ranitidine (ZANTAC) 150 MG tablet TAKE 1 TABLET BY MOUTH EVERY DAY AS NEEDED 08/12/15   Marjan Rabbani, MD   BP 145/79 mmHg  Pulse 71  Temp(Src) 98.3 F (36.8 C) (Oral)  Resp 16  SpO2 97% Physical Exam  Constitutional: She is oriented to person, place, and time. She appears well-developed and well-nourished. No distress.  HENT:  Head: Normocephalic and atraumatic.  Right Ear: External ear normal.  Left Ear: External ear normal.  Nose: Nose normal.  Mouth/Throat: Oropharynx is clear and moist.  Eyes: Conjunctivae are normal. Pupils are equal, round, and reactive to light. Right eye exhibits no discharge. Left eye exhibits no discharge. No scleral icterus.  Neck: Normal range of motion. Neck supple.  Cardiovascular: Normal rate, regular rhythm, normal heart sounds and intact distal pulses.   No murmur heard. Pulmonary/Chest: Effort normal and breath sounds normal. No respiratory distress. She has no wheezes. She has no rales.  Abdominal: Soft. Bowel sounds are normal. She exhibits no distension. There is no tenderness.  Musculoskeletal:  Trace BLE edema  Lymphadenopathy:    She has no cervical adenopathy.  Neurological: She is alert and oriented to person, place, and time. No cranial nerve deficit.  Skin: Skin is warm and dry. No rash noted. She is not diaphoretic. No pallor.  Psychiatric: She has a normal mood and affect. Her behavior is normal.  Nursing note and vitals reviewed.   ED Course  Procedures (including critical care time) Labs Review Labs Reviewed  BASIC METABOLIC PANEL - Abnormal; Notable for the following:    CO2 20 (*)    Glucose, Bld 356 (*)    BUN 32 (*)    Creatinine, Ser 1.49  (*)    GFR calc non Af Amer 36 (*)    GFR calc Af Amer 42 (*)    All other components within normal limits  CBC - Abnormal; Notable for the following:    Hemoglobin 11.0 (*)    HCT 34.1 (*)    All other components within normal limits  CBG MONITORING, ED - Abnormal; Notable for the following:    Glucose-Capillary 345 (*)    All other components within normal limits  CBG MONITORING, ED - Abnormal; Notable for the following:    Glucose-Capillary 277 (*)    All other components within normal  limits  URINALYSIS, ROUTINE W REFLEX MICROSCOPIC (NOT AT [facility])    Imaging Review No results found. I have personally reviewed and evaluated these images and lab results as part of my medical decision-making.   EKG Interpretation None      MDM   Final diagnoses:  Type 2 diabetes mellitus with hyperglycemia, with long-term current use of insulin (HCC)   Asymptomatic hyperglycemia in the setting of diabetes off meds. Give home dose of 70/30 here and refill script given. F/u with PCP next week. Return for CP, SOB, abdominal pain, dizziness, confusion.   Frazier Richards, MD 03/10/16 Patrecia Pour  Elnora Morrison, MD 03/11/16 [phone]

## 2016-03-10 NOTE — ED Notes (Signed)
Pt reports she is only here to have her prescription for Novolog 70/30 refilled. She reports she feels fine but had one episode of diarrhea this morning after eating cereal and milk. Pt initially refused vital signs and stated she only came here to get med refilled but persuaded to allow staff to obtain vital signs.

## 2016-03-10 NOTE — Discharge Instructions (Signed)
Hyperglycemia °High blood sugar (hyperglycemia) means that the level of sugar in your blood is higher than it should be. Signs of high blood sugar include: °· Feeling thirsty. °· Frequent peeing (urinating). °· Feeling tired or sleepy. °· Dry mouth. °· Vision changes. °· Feeling weak. °· Feeling hungry but losing weight. °· Numbness and tingling in your hands or feet. °· Headache. °When you ignore these signs, your blood sugar may keep going up. These problems may get worse, and other problems may begin. °HOME CARE °· Check your blood sugars as told by your doctor. Write down the numbers with the date and time. °· Take the right amount of insulin or diabetes pills at the right time. Write down the dose with date and time. °· Refill your insulin or diabetes pills before running out. °· Watch what you eat. Follow your meal plan. °· Drink liquids without sugar, such as water. Check with your doctor if you have kidney or heart disease. °· Follow your doctor's orders for exercise. Exercise at the same time of day. °· Keep your doctor's appointments. °GET HELP RIGHT AWAY IF:  °· You have trouble thinking or are confused. °· You have fast breathing with fruity smelling breath. °· You pass out (faint). °· You have 2 to 3 days of high blood sugars and you do not know why. °· You have chest pain. °· You are feeling sick to your stomach (nauseous) or throwing up (vomiting). °· You have sudden vision changes. °MAKE SURE YOU:  °· Understand these instructions. °· Will watch your condition. °· Will get help right away if you are not doing well or get worse. °  °This information is not intended to replace advice given to you by your health care provider. Make sure you discuss any questions you have with your health care provider. °  °Document Released: 08/27/2009 Document Revised: 11/20/2014 Document Reviewed: 07/06/2015 °Elsevier Interactive Patient Education ©2016 Elsevier Inc. ° °

## 2016-03-10 NOTE — Telephone Encounter (Signed)
Last appt 01/11/16; next f/u is 5/12 with Dr Naaman Plummer and [name].

## 2016-03-10 NOTE — ED Notes (Signed)
CBG was 277, RN notifed

## 2016-03-13 ENCOUNTER — Other Ambulatory Visit: Payer: Self-pay | Admitting: Internal Medicine

## 2016-03-24 ENCOUNTER — Encounter: Payer: Medicare Other | Admitting: Dietician

## 2016-03-24 ENCOUNTER — Encounter: Payer: Medicare Other | Admitting: Internal Medicine

## 2016-04-18 ENCOUNTER — Encounter: Payer: Self-pay | Admitting: Dietician

## 2016-04-19 ENCOUNTER — Ambulatory Visit: Payer: Medicare Other | Admitting: Podiatry

## 2016-04-24 ENCOUNTER — Telehealth: Payer: Self-pay | Admitting: Pharmacist

## 2016-04-24 DIAGNOSIS — I10 Essential (primary) hypertension: Secondary | ICD-10-CM

## 2016-04-24 DIAGNOSIS — N183 Chronic kidney disease, stage 3 unspecified: Secondary | ICD-10-CM

## 2016-04-24 DIAGNOSIS — E1122 Type 2 diabetes mellitus with diabetic chronic kidney disease: Secondary | ICD-10-CM

## 2016-04-26 MED ORDER — LOSARTAN POTASSIUM-HCTZ 100-25 MG PO TABS: 1.0000 | ORAL_TABLET | Freq: Every day | ORAL | Status: DC

## 2016-04-26 MED ORDER — LINAGLIPTIN 5 MG PO TABS: 5.0000 mg | ORAL_TABLET | Freq: Every day | ORAL | Status: DC

## 2016-04-26 NOTE — Telephone Encounter (Signed)
Refills sent per PCP. Combined losartan and HCTZ, [facility]. Patient was also notified and verbalized understanding.

## 2016-04-28 ENCOUNTER — Other Ambulatory Visit: Payer: Self-pay

## 2016-04-28 DIAGNOSIS — N183 Chronic kidney disease, stage 3 unspecified: Secondary | ICD-10-CM

## 2016-04-28 DIAGNOSIS — E1122 Type 2 diabetes mellitus with diabetic chronic kidney disease: Secondary | ICD-10-CM

## 2016-04-28 MED ORDER — GLIPIZIDE ER 5 MG PO TB24: 5.0000 mg | ORAL_TABLET | Freq: Every day | ORAL | Status: DC

## 2016-04-28 NOTE — Telephone Encounter (Signed)
Pt [facility] [address].

## 2016-05-01 ENCOUNTER — Ambulatory Visit (INDEPENDENT_AMBULATORY_CARE_PROVIDER_SITE_OTHER): Payer: Medicare Other | Admitting: Internal Medicine

## 2016-05-01 ENCOUNTER — Encounter: Payer: Self-pay | Admitting: Internal Medicine

## 2016-05-01 VITALS — Temp 97.9°F | Ht 62.0 in | Wt 181.2 lb

## 2016-05-01 DIAGNOSIS — N183 Chronic kidney disease, stage 3 unspecified: Secondary | ICD-10-CM

## 2016-05-01 DIAGNOSIS — E1122 Type 2 diabetes mellitus with diabetic chronic kidney disease: Secondary | ICD-10-CM | POA: Diagnosis not present

## 2016-05-01 DIAGNOSIS — Z794 Long term (current) use of insulin: Secondary | ICD-10-CM | POA: Diagnosis not present

## 2016-05-01 DIAGNOSIS — I129 Hypertensive chronic kidney disease with stage 1 through stage 4 chronic kidney disease, or unspecified chronic kidney disease: Secondary | ICD-10-CM | POA: Diagnosis not present

## 2016-05-01 DIAGNOSIS — I10 Essential (primary) hypertension: Secondary | ICD-10-CM

## 2016-05-01 DIAGNOSIS — M79644 Pain in right finger(s): Secondary | ICD-10-CM

## 2016-05-01 LAB — POCT GLYCOSYLATED HEMOGLOBIN (HGB A1C): HEMOGLOBIN A1C: 12.1

## 2016-05-01 LAB — GLUCOSE, CAPILLARY: GLUCOSE-CAPILLARY: 145 mg/dL — AB (ref 65–99)

## 2016-05-01 MED ORDER — GLIPIZIDE ER 5 MG PO TB24
5.0000 mg | ORAL_TABLET | Freq: Every day | ORAL | Status: DC
Start: 2016-05-01 — End: 2016-10-18

## 2016-05-01 MED ORDER — METFORMIN HCL ER 500 MG PO TB24: 500.0000 mg | ORAL_TABLET | Freq: Every day | ORAL | Status: DC

## 2016-05-01 NOTE — Progress Notes (Signed)
   Subjective:    Patient ID: [name], female    DOB: 03-02-1951, 65 y.o.   MRN: [id number]  HPI  65 yo with uncontrolled DM II with CKD 3, HTN, PVD, GERD, HLD, hx of CVA, presents for follow up on DM II.  DM II - last a1c on 2/28 10.4. On glipizide 5mg  24hr tablet daily ( taking twice a day causes itching) + novolog 70/30 35 units bid + linagliptin 5mg  tabs daily. Office CBG 145. Does not check home sugar readings.   HTN - on losartan-hctz 100-25mg  daily but has only been taking losartan, has not taken it today. No chest pain, headache, vision changes or other symptoms.   Right 4th finger pain - has pain for 1 month, no injuries, is unable to flex 4th finger completley, has pain at the base of the finger.   Review of Systems  Constitutional: Negative for fever and chills.  HENT: Negative for congestion and sore throat.   Respiratory: Negative for chest tightness and shortness of breath.   Cardiovascular: Negative for chest pain, palpitations and leg swelling.  Neurological: Negative for dizziness and headaches.       Objective:   Physical Exam  Constitutional: She appears well-developed and well-nourished.  Eyes: Conjunctivae are normal. Right eye exhibits no discharge. Left eye exhibits no discharge. No scleral icterus.  Neck: Normal range of motion. No JVD present.  Cardiovascular: Normal rate and regular rhythm.  Exam reveals no gallop and no friction rub.   No murmur heard. Pulmonary/Chest: Effort normal and breath sounds normal. No respiratory distress. She has no wheezes.  Musculoskeletal: Normal range of motion. She exhibits no edema or tenderness.  Has tenderness over the right 4th MCP area, no swelling or erythema. Has limited active flexion, good passive ROM.     Filed Vitals:   05/01/16 1053  Temp: 97.9 F (36.6 C)   197/78 BP.       Assessment & Plan:  See problem based a&p.

## 2016-05-01 NOTE — Assessment & Plan Note (Signed)
Filed Vitals:   05/01/16 1053  Temp: 97.9 F (36.6 C)   BP 197/78, elevated, likely 2/2 to not taking her meds today.   Will continue losartan-hctz 100-25mg  daily. F/up in 1 month.

## 2016-05-01 NOTE — Assessment & Plan Note (Signed)
Unable to flex right 4th finger at the MTP joint. No effusion or erythema on exam. No injuries. This could be 2/2 to flexor tendon injury/inflammation.  Will try finger splint. If not getting better, may consider sports medicine referral.

## 2016-05-01 NOTE — Assessment & Plan Note (Signed)
last a1c on 2/28 10.4, today 12.1. On glipizide 5mg  24hr tablet daily ( taking twice a day causes itching) + novolog 70/30 35 units bid + linagliptin 5mg  tabs daily. Office CBG 145. Does not check home sugar readings.  Had GI side effect with metformin. Has not tried metformin ext release per patient.  Will add metformin ER, increase from 500mg  daily to 1000mg  bid weekly Continue the rest. Hard to go up on insulin without home sugar readings. Strongly encouraged her to start checking sugar at least 2x at home (patient said can't do it more than 2x).  F/up in 1 month.

## 2016-05-01 NOTE — Patient Instructions (Signed)
Please start checking your sugar at home daily at least twice.  Start taking metformin extended release as instructed (go up slowly). If you have stomach problems, stay on the lower dose without going up.  Follow up in 1 month.

## 2016-05-02 NOTE — Progress Notes (Signed)
Internal Medicine Clinic Attending  Case discussed with Dr. Ahmed at the time of the visit.  We reviewed the resident's history and exam and pertinent patient test results.  I agree with the assessment, diagnosis, and plan of care documented in the resident's note. 

## 2016-05-09 ENCOUNTER — Telehealth: Payer: Self-pay | Admitting: *Deleted

## 2016-05-09 ENCOUNTER — Encounter: Payer: Self-pay | Admitting: *Deleted

## 2016-05-09 NOTE — Telephone Encounter (Signed)
WALK IN Pt states since starting the losartan/ hctz she has muscle cramps at night and burning all over, she would like to go back to regular losartan

## 2016-05-10 NOTE — Telephone Encounter (Signed)
That's fine

## 2016-05-15 NOTE — Telephone Encounter (Signed)
Attempted to call, no answer or voicemail.

## 2016-05-17 NOTE — Addendum Note (Signed)
Addended by: Hulan Fray on: 05/17/2016 06:06 PM   Modules accepted: Orders

## 2016-05-19 NOTE — Telephone Encounter (Signed)
No answer or VM

## 2016-05-22 NOTE — Telephone Encounter (Signed)
No answer, no VM

## 2016-06-06 ENCOUNTER — Telehealth: Payer: Self-pay

## 2016-06-06 DIAGNOSIS — I10 Essential (primary) hypertension: Secondary | ICD-10-CM

## 2016-06-06 MED ORDER — LOSARTAN POTASSIUM 100 MG PO TABS: 100.0000 mg | ORAL_TABLET | Freq: Every day | ORAL | 2 refills | Status: DC

## 2016-06-06 MED ORDER — HYDROCHLOROTHIAZIDE 25 MG PO TABS: 25.0000 mg | ORAL_TABLET | Freq: Every day | ORAL | 2 refills | Status: DC

## 2016-06-06 NOTE — Telephone Encounter (Signed)
On June 28 dr patel stated pt could go back to plain losartan, pt is now calling for a refill could you please place a new order for losartan?

## 2016-06-06 NOTE — Telephone Encounter (Signed)
Requesting losartan to be filled. Also please call pt back.

## 2016-07-13 ENCOUNTER — Other Ambulatory Visit: Payer: Self-pay | Admitting: Internal Medicine

## 2016-07-13 DIAGNOSIS — Z8673 Personal history of transient ischemic attack (TIA), and cerebral infarction without residual deficits: Secondary | ICD-10-CM

## 2016-08-10 ENCOUNTER — Other Ambulatory Visit: Payer: Self-pay

## 2016-08-10 NOTE — Patient Outreach (Signed)
[facility]) Care Management  08/10/2016  [name] November 03, 1951 [id number]   Telephone Screen  Referral Date: 08/09/16 Referral Source: MD office(Dr. Jari Favre) Referral Reason: "DM,HTN, medication adherence, barriers to care"    Outreach attempt # 1 to patient. Spoke with patient and screening completed.   Social: Patient resides in her home. She reports that she is independent with ADLs/IADLs. She drives herself to MD appts. She denies any falls. Patient reported only DME in the home is [employer] meter.   Conditions: Per records patient has PMH of DM, CKD stage 3, HTN, PVD, GERD, HLD and CVA. Patient states that she is checking her blood sugars once a day before meal. She reports that they normally run in the 200s. Last A1C noted to be 12.1(June 2017). Patient does not monitor BP in the home and voices she has no idea what her BP normally runs. Patient admits she needs assistance managing her chronic conditions.    Medications: Per patient she is taking about 8 meds. She denies any issues with affordability and/or managing meds.   Appointments: She reports she saw PCP last in June. patient inquiring when next PCP appt. No upcoming appt noted in chart for patient. Advised patient to contact MD office to follow up on when she should be seen again. She voiced understanding.    Advance Directives: Patient does not have any and declined further information at this time.   Consent: [facility] services reviewed and discussed. Patient gave verbal consent for [facility] services.    Plan: RN CM will notify [facility] administrative assistant of case status. RN CM will send [facility] community nurse referral for further in home eval and management of chronic conditions RN CM provided patient with [facility] contact info for future reference.  Enzo Montgomery, RN,BSN,CCM [place] Management Telephonic Care Management Coordinator Direct Phone: [phone] Toll Free: [phone] Fax:  [phone]

## 2016-08-15 ENCOUNTER — Telehealth: Payer: Self-pay | Admitting: Dietician

## 2016-08-15 ENCOUNTER — Other Ambulatory Visit: Payer: Self-pay | Admitting: *Deleted

## 2016-08-15 NOTE — Patient Outreach (Signed)
[facility]) Care Management  08/15/2016  [name] [birth year] [id number]   Referral received from telephonic care manager to contact member for home assessment and potential community involvement.  According to chart, member has history of hypertension, peripheral vascular disease, GERD, Diabetes (A1C 12.1), hypothyroidism, chronic kidney disease, and hyperlipidemia.    Call placed to member, identity verified.  This care manager introduced self and purpose of call.  [facility] care management services explained.  She agrees to home visit next week, prefer to wait until visit to address concerns.  Contact information provided, encouraged to contact with questions.  Valente David, [place], MSN [place] [phone]

## 2016-08-18 ENCOUNTER — Ambulatory Visit: Payer: Medicare Other | Admitting: Pharmacist

## 2016-08-18 DIAGNOSIS — Z79899 Other long term (current) drug therapy: Secondary | ICD-10-CM

## 2016-08-18 NOTE — Progress Notes (Signed)
Medication Samples have been provided to the patient.  Drug: Novolog 70/30 FlexPen  Strength: 70/30 (100-U) Qty: 2 Pens LOT: [id number] Exp.Date: 08/2016 Dosing instructions: Inject 36 units SubQ BID   The patient has been instructed regarding the correct time, dose, and frequency of taking this medication, including desired effects and most common side effects.   Samples signed for by Dr. Silas Flood 10:22 AM 08/18/2016

## 2016-08-21 NOTE — Progress Notes (Signed)
Medication Samples have been provided to the patient.  Drug: Novolog 70/30 FlexPen            Strength: 70/30 (100U)           Qty: 1 pen       LOT: [id number]         Exp.Date: 08/2016 Dosing instructions: Inject 36 units under the skin BID    The patient has been instructed regarding the correct time, dose, and frequency of taking this medication, including desired effects and most common side effects.   Samples signed for by Dr. Silas Flood 9:42 AM 08/18/2016

## 2016-08-21 NOTE — Telephone Encounter (Signed)
Called patient to follow up on her goals for Medical Nutrition Therapy to improve her blood sugars. She could not talk and agreed to call back tomorrow.

## 2016-08-22 ENCOUNTER — Other Ambulatory Visit: Payer: Self-pay | Admitting: *Deleted

## 2016-08-22 ENCOUNTER — Encounter: Payer: Self-pay | Admitting: *Deleted

## 2016-08-22 NOTE — Patient Outreach (Signed)
[facility]) Care Management   08/22/2016  [name] 1951-04-30 [id number]  [name] is an 65 y.o. female  Subjective:   Member reports that she has been doing well, managing her chronic conditions independently.  She lives alone, reports that she is compliant with her medications.  She does admit that she does not check her blood sugar as instructed, but does take her insulin twice a day.  She report that although it is hard financially, she follows a diabetic diet "most of the time."  She denies any complaints of pain or discomfort.    Objective:   Review of Systems  Constitutional: Negative.   HENT: Negative.   Eyes: Negative.   Respiratory: Negative.   Cardiovascular: Negative.   Gastrointestinal: Negative.   Genitourinary: Negative.   Musculoskeletal: Negative.   Skin: Negative.   Neurological: Negative.   Endo/Heme/Allergies: Negative.   Psychiatric/Behavioral: Negative.     Physical Exam  Constitutional: She is oriented to person, place, and time. She appears well-developed and well-nourished.  Neck: Normal range of motion.  Cardiovascular: Normal rate, regular rhythm and normal heart sounds.   Respiratory: Effort normal and breath sounds normal.  GI: Soft. Bowel sounds are normal.  Musculoskeletal: Normal range of motion.  Neurological: She is alert and oriented to person, place, and time.  Skin: Skin is warm and dry.    BP 126/74 (BP Location: Right Arm, Patient Position: Sitting, Cuff Size: Normal)   Pulse 72   Resp 18   Ht 1.575 m (_0 )   Wt 180 lb (81.6 kg)   SpO2 98%   BMI 32.92 kg/m    Encounter Medications:   Outpatient Encounter Prescriptions as of 08/22/2016  Medication Sig Note  . aspirin EC 81 MG tablet Take 81 mg by mouth daily.   . cilostazol (PLETAL) 100 MG tablet Take 1 tablet (100 mg total) by mouth 2 (two) times daily. 08/22/2016: Taking only as needed   . clopidogrel (PLAVIX) 75 MG tablet TAKE 1 TABLET BY  MOUTH EVERY MORNING   . gemfibrozil (LOPID) 600 MG tablet TAKE 1 TABLET BY MOUTH TWICE A DAY   . hydrochlorothiazide (HYDRODIURIL) 25 MG tablet Take 1 tablet (25 mg total) by mouth daily.   . Insulin Pen Needle 31G X 6 MM MISC Use to inject insulin twice daily   . Insulin Syringe-Needle U-100 31G X 15/64" 0.5 ML MISC Use to inject insulin two times a day   . levothyroxine (SYNTHROID) 88 MCG tablet Take 1 tablet (88 mcg total) by mouth daily before breakfast.   . linagliptin (TRADJENTA) 5 MG TABS tablet Take 1 tablet (5 mg total) by mouth daily.   Marland Kitchen losartan (COZAAR) 100 MG tablet Take 1 tablet (100 mg total) by mouth daily.   Marland Kitchen NOVOLOG MIX 70/30 (70-30) 100 UNIT/ML injection INJECT 0.36 MLS (36 UNITS TOTAL) INTO THE SKIN 2 (TWO) TIMES DAILY WITH A MEAL.   . ranitidine (ZANTAC) 150 MG tablet TAKE 1 TABLET BY MOUTH EVERY DAY AS NEEDED   . glipiZIDE (GLUCOTROL XL) 5 MG 24 hr tablet Take 1 tablet (5 mg total) by mouth daily with breakfast. (Patient not taking: Reported on 08/22/2016)   . metFORMIN (GLUCOPHAGE XR) 500 MG 24 hr tablet Take 1 tablet (500 mg total) by mouth daily with breakfast. (Patient not taking: Reported on 08/22/2016)    No facility-administered encounter medications on file as of 08/22/2016.     Functional Status:   In your present state  of health, do you have any difficulty performing the following activities: 08/22/2016 05/01/2016  Hearing? N N  Vision? N N  Difficulty concentrating or making decisions? N N  Walking or climbing stairs? N N  Dressing or bathing? N N  Doing errands, shopping? N N  Preparing Food and eating ? N -  Using the Toilet? N -  In the past six months, have you accidently leaked urine? Y -  Do you have problems with loss of bowel control? N -  Managing your Medications? N -  Managing your Finances? N -  Housekeeping or managing your Housekeeping? N -  Some recent data might be hidden    Fall/Depression Screening:    PHQ 2/9 Scores 08/10/2016  05/01/2016 10/29/2015 09/17/2015 07/01/2015 06/01/2015 10/14/2014  PHQ - 2 Score 0 0 0 0 0 0 0   Fall Risk  08/10/2016 05/01/2016 10/29/2015 09/17/2015 07/01/2015  Falls in the past year? _0     Assessment:    Met with member at scheduled time.  [facility] care management services again explained, consent obtained.  Member denies the need for pharmacy assistance, but does want social worker involvement to assist with help finding affordable housing and applying for food stamps.  She is independent in her care, no transportation needs.  Member aware that her A1C is elevated (12.1, last checked in June).  She state she has been working with dietician, D. Plyler, and has another appointment in December for follow up.  She is noncompliant with glucose checks, stating that she does not like for her fingers to be sore.  Discussed the option of using a sensitive meter/needle, she state that wouldn't make a difference because she "just don't like to do it."  She does check her blood sugar today, and it is elevated.  Again reviewed proper diabetic diet and glucose control as well as medication management.  She again state that she is compliant and "don't know why it's so high."  She does agree to check her blood sugar at least once daily over the next month for more adequate management of her blood sugar.    Member provided with [facility] calendar tool and contact information for this care manager.  She denies any other needs at this time, advised to contact for questions/concerns.  Plan:   Will provide member with EMMI education on diabetes management (including controlling A1C, diet, and exercise). Will place referral for social worker to assist with food stamp application and housing list. Routine home visit scheduled for next month.   [facility] CM Care Plan Problem One   Flowsheet Row Most Recent Value  Care Plan Problem One  Self-Care Deficit related to diabetes management as evidenced by elevated A1C  Role  Documenting the Problem One  Care Management [place] for Problem One  Active  THN Long Term Goal (31-90 days)  Member's A1C will decrease from 12.1 to </= 10 over the next 90 days  THN Long Term Goal Start Date  08/22/16  Interventions for Problem One Long Term Goal  Educated member on ways to decrease A1C, including diet, exercise, daily blood sugar checks with recording for a visual trend.  THN CM Short Term Goal #1 (0-30 days)  Member will report checking and recording blood sugar at least once daily over the next 4 weeks  THN CM Short Term Goal #1 Start Date  08/22/16  Interventions for Short Term Goal #1  Educated member using teachback on importance  of monitoring blood sugar as advised, and how daily monitor aides in better control of blood sugar.  Provided with THN calender, which includes signs/symptoms of hypo-hyperglycemia and log for blood sugar checks  THN CM Short Term Goal #2 (0-30 days)  Member will report understanding of assigned EMMI education for diabetes management over the next 4 weeks  THN CM Short Term Goal #2 Start Date  08/22/16  Interventions for Short Term Goal #2  Member assigned EMMI education on diabetes management.  Advised that she will receive education in the mail as she does not have access to internet/computer     Valente David, RN, MSN [place] [phone]

## 2016-08-23 ENCOUNTER — Encounter: Payer: Self-pay | Admitting: *Deleted

## 2016-08-24 ENCOUNTER — Other Ambulatory Visit: Payer: Self-pay | Admitting: Internal Medicine

## 2016-08-24 DIAGNOSIS — N183 Chronic kidney disease, stage 3 (moderate): Principal | ICD-10-CM

## 2016-08-24 DIAGNOSIS — E1122 Type 2 diabetes mellitus with diabetic chronic kidney disease: Secondary | ICD-10-CM

## 2016-08-25 NOTE — Addendum Note (Signed)
Addended byValente David on: 08/25/2016 12:22 PM   Modules accepted: Orders

## 2016-08-28 ENCOUNTER — Other Ambulatory Visit: Payer: Self-pay | Admitting: *Deleted

## 2016-08-28 ENCOUNTER — Encounter: Payer: Self-pay | Admitting: *Deleted

## 2016-08-28 MED ORDER — GLUCOSE BLOOD VI STRP: ORAL_STRIP | 4 refills | Status: DC

## 2016-08-28 NOTE — Telephone Encounter (Signed)
[facility] Accu-chek Aviva Plus test strips. Thanks

## 2016-08-29 ENCOUNTER — Encounter: Payer: Self-pay | Admitting: *Deleted

## 2016-08-29 ENCOUNTER — Telehealth: Payer: Self-pay | Admitting: Dietician

## 2016-08-29 ENCOUNTER — Other Ambulatory Visit: Payer: Self-pay | Admitting: *Deleted

## 2016-08-29 NOTE — Patient Outreach (Addendum)
[facility]) Care Management  08/29/2016  [name] 05/06/1951 [id number]  CSW received a new referral on patient from Valente David, [facility] with [place] Management, indicating that patient would benefit from social work services and resources to assist with obtaining [employer], as well as relocating into affordable senior housing. CSW was able to make initial contact with patient today to perform phone assessment, as well as assess and assist with social work needs and services.  CSW introduced self, explained role and types of services provided through [place] Management ([place] [facility]).  CSW further explained to patient that CSW works with patient's RNCM, also with [place] Management, Valente David. CSW then explained the reason for the call, indicating that Mrs. Orene Desanctis thought that patient would benefit from social work services and resources to assist with referrals to various community agencies and resources, such as the [place] for [employer], as well as providing patient with affordable housing agencies for seniors in the community.  CSW obtained two HIPAA compliant identifiers from patient, which included patient's name and date of birth. Patient admits that she struggles paying for food each month and wanted to know if CSW thought that she would qualify for Food Stamps.  After a Financial Assessment Form was completed, it was determined that patient would most definitely qualify, as she currently receives full Adult Medicaid coverage, meeting the [place] [employer] [place] for fiscal year 2017/2018.  CSW agreed to mail patient the following packet of resource information: Application for Food Stamps [employer] [place] for [place] Patient is also interested in obtaining resources for  affordable housing, which CSW has also agreed to include in the packet of resource information to be mailed to patient's home.  CSW will contact patient in one week to follow-up to ensure that patient received the packet of resource information that CSW mailed to patient's home, as well as answer any questions that patient my have at that time.  CSW will also offer to assist patient with completion of applications, if necessary. Nat Christen, BSW, MSW, LCSW  Licensed Education officer, [facility]  Mailing [place] [address], [place], [place] [address] Physical Address-[address], [place], [place] [address] Toll Free Main # [phone] Fax # [phone] Cell # [phone]  Fax # [phone]  [email or web address]

## 2016-08-29 NOTE — Telephone Encounter (Signed)
Med refill history shows that patient has been getting 1 vial of insulin when she fills her prescription and she needs at least 2 vials a month to take her prescribed dose. It also appears that she did not refill her insulin between 04/08/16 to 08/10/16, but did fill her glipizide and metformin.   Suggest new prescription for 2 vials of insulin per refill and pharmacy consult either with [facility] or Dr. Maudie Mercury.

## 2016-08-31 MED ORDER — INSULIN ASPART PROT & ASPART (70-30 MIX) 100 UNIT/ML ~~LOC~~ SUSP: 36.0000 [IU] | Freq: Two times a day (BID) | SUBCUTANEOUS | 9 refills | Status: DC

## 2016-08-31 NOTE — Addendum Note (Signed)
Addended by: Thurnell Lose on: 08/31/2016 11:36 AM   Modules accepted: Orders

## 2016-09-01 ENCOUNTER — Other Ambulatory Visit: Payer: Self-pay | Admitting: *Deleted

## 2016-09-01 DIAGNOSIS — I10 Essential (primary) hypertension: Secondary | ICD-10-CM

## 2016-09-01 MED ORDER — LOSARTAN POTASSIUM 100 MG PO TABS: 100.0000 mg | ORAL_TABLET | Freq: Every day | ORAL | 3 refills | Status: DC

## 2016-09-01 NOTE — Patient Outreach (Signed)
[facility]) Care Management  09/01/2016  LAQUIDA STROUP Feb 07, 1951 [id number]   Request from Nat Christen, LCSW to mail patient application for food stamps, nutrition community resources, affordable housing information and senior living guide. Information mailed today, 09/01/16  [name], [place] Management Assistant

## 2016-09-05 ENCOUNTER — Other Ambulatory Visit: Payer: Self-pay

## 2016-09-05 DIAGNOSIS — I10 Essential (primary) hypertension: Secondary | ICD-10-CM

## 2016-09-05 NOTE — Telephone Encounter (Signed)
hydrochlorothiazide (HYDRODIURIL) 25 MG tablet, [facility] [address]. Please call pt back.

## 2016-09-06 ENCOUNTER — Other Ambulatory Visit: Payer: Self-pay | Admitting: *Deleted

## 2016-09-06 NOTE — Patient Outreach (Signed)
[place] [facility]) Care Management  09/06/2016  [name] 1951-11-02 [id number]   CSW was able to make contact with patient today to follow-up regarding social work services and resources, as well as to ensure that patient received the packet of resource information that CSW mailed to patient's home.  Patient admits that she received the packet and has been reviewing the material.  Patient plans to utilize the [employer] for [employer] to assist with relocating.  Patient denied needing assistance with referrals, nor did patient want assistance with completion of applications. CSW will perform a case closure on patient, as all goals of treatment have been met from social work standpoint and no additional social work needs have been identified at this time.  CSW will notify patient's RNCM with [place] [facility], Valente David of CSW's plans to close patient's case.  CSW will fax an update to patient's Primary Care Physician, Dr. [name]  to ensure that they are aware of CSW's involvement with patient's plan of care.  CSW will submit a case closure request to Verlon Setting, Care Management Assistant with [place] [facility], in the form of an [employer].  CSW will ensure that [name] is aware of Roma Schanz, RNCM with [place] [facility], continued involvement with patient's care.  Nat Christen, BSW, MSW, LCSW  Licensed Education officer, [facility]  Mailing [place] [address] [place], [place] [address] Physical Address-[address], [place], [place] [address] Toll Free Main # [phone] Fax # [phone] Cell # [phone]  Fax # [phone]  [email or web address]

## 2016-09-08 MED ORDER — HYDROCHLOROTHIAZIDE 25 MG PO TABS: 25.0000 mg | ORAL_TABLET | Freq: Every day | ORAL | 2 refills | Status: DC

## 2016-09-08 NOTE — Telephone Encounter (Signed)
Pt called / informed of refill. 

## 2016-09-08 NOTE — Telephone Encounter (Signed)
Pt calling to check status of refill

## 2016-09-18 ENCOUNTER — Other Ambulatory Visit: Payer: Self-pay

## 2016-09-18 NOTE — Telephone Encounter (Signed)
levothyroxine (SYNTHROID) 88 MCG tablet, hydrochlorothiazide (HYDRODIURIL) 25 MG tablet, refill request @ [facility] on [address].

## 2016-09-19 ENCOUNTER — Other Ambulatory Visit: Payer: Self-pay | Admitting: *Deleted

## 2016-09-19 MED ORDER — LEVOTHYROXINE SODIUM 88 MCG PO TABS: 88.0000 ug | ORAL_TABLET | Freq: Every day | ORAL | 3 refills | Status: DC

## 2016-09-19 NOTE — Telephone Encounter (Signed)
The hctz was refilled 10/27, sending request for levothyroxine

## 2016-09-19 NOTE — Patient Outreach (Signed)
[place] [facility]) Care Management   09/19/2016  ALAISHA [name] 04/15/1951 [id number]  [name] is an 65 y.o. female  Subjective:   Member reports that she is doing "pretty good."  She denies any pain or discomfort at this time.  Objective:   Review of Systems  Constitutional: Negative.   HENT: Negative.   Eyes: Negative.   Respiratory: Negative.   Cardiovascular: Negative.   Gastrointestinal: Negative.   Genitourinary: Negative.   Musculoskeletal: Negative.   Skin: Negative.   Neurological: Negative.   Endo/Heme/Allergies: Negative.   Psychiatric/Behavioral: Negative.     Physical Exam  Constitutional: She is oriented to person, place, and time. She appears well-developed and well-nourished.  Neck: Normal range of motion.  Cardiovascular: Normal rate, regular rhythm and normal heart sounds.   Respiratory: Effort normal and breath sounds normal.  GI: Soft. Bowel sounds are normal.  Musculoskeletal: Normal range of motion.  Neurological: She is alert and oriented to person, place, and time.  Skin: Skin is warm and dry.   BP (!) 152/78 (BP Location: Left Arm, Patient Position: Sitting, Cuff Size: Normal)   Pulse 79   Resp 18   SpO2 96%   Encounter Medications:   Outpatient Encounter Prescriptions as of 09/19/2016  Medication Sig Note  . aspirin EC 81 MG tablet Take 81 mg by mouth daily.   . cilostazol (PLETAL) 100 MG tablet Take 1 tablet (100 mg total) by mouth 2 (two) times daily. 08/22/2016: Taking only as needed   . clopidogrel (PLAVIX) 75 MG tablet TAKE 1 TABLET BY MOUTH EVERY MORNING   . gemfibrozil (LOPID) 600 MG tablet TAKE 1 TABLET BY MOUTH TWICE A DAY 09/19/2016: Only take once a day  . glucose blood (ACCU-CHEK AVIVA PLUS) test strip Use to check blood sugars twice a day.Dx Code: E11.22.   . hydrochlorothiazide (HYDRODIURIL) 25 MG tablet Take 1 tablet (25 mg total) by mouth daily.   . insulin aspart protamine- aspart (NOVOLOG MIX 70/30)  (70-30) 100 UNIT/ML injection Inject 0.36 mLs (36 Units total) into the skin 2 (two) times daily with a meal.   . Insulin Pen Needle 31G X 6 MM MISC Use to inject insulin twice daily   . Insulin Syringe-Needle U-100 31G X 15/64" 0.5 ML MISC Use to inject insulin two times a day   . levothyroxine (SYNTHROID) 88 MCG tablet Take 1 tablet (88 mcg total) by mouth daily before breakfast.   . losartan (COZAAR) 100 MG tablet Take 1 tablet (100 mg total) by mouth daily.   . ranitidine (ZANTAC) 150 MG tablet TAKE 1 TABLET BY MOUTH EVERY DAY AS NEEDED   . TRADJENTA 5 MG TABS tablet TAKE 1 TABLET BY MOUTH EVERY DAY   . glipiZIDE (GLUCOTROL XL) 5 MG 24 hr tablet Take 1 tablet (5 mg total) by mouth daily with breakfast. (Patient not taking: Reported on 09/19/2016)   . metFORMIN (GLUCOPHAGE XR) 500 MG 24 hr tablet Take 1 tablet (500 mg total) by mouth daily with breakfast. (Patient not taking: Reported on 09/19/2016)    No facility-administered encounter medications on file as of 09/19/2016.     Functional Status:   In your present state of health, do you have any difficulty performing the following activities: 08/29/2016 08/22/2016  Hearing? N N  Vision? N N  Difficulty concentrating or making decisions? N N  Walking or climbing stairs? N N  Dressing or bathing? N N  Doing errands, shopping? N N  Conservation officer, nature and  eating ? N N  Using the Toilet? N N  In the past six months, have you accidently leaked urine? Y Y  Do you have problems with loss of bowel control? N N  Managing your Medications? N N  Managing your Finances? N N  Housekeeping or managing your Housekeeping? N N  Some recent data might be hidden    Fall/Depression Screening:    PHQ 2/9 Scores 08/29/2016 08/10/2016 05/01/2016 10/29/2015 09/17/2015 07/01/2015 06/01/2015  PHQ - 2 Score 0 0 0 0 0 0 0    Assessment:    Met with member at scheduled time.  She has kept record of daily blood sugar readings, ranging 105-400.  She has not checked  daily (17/28) but consistency is much improved from last month where she had not checked at all.  Her readings have stated to consistently be below 200 over the past 2 weeks.  She state she has changed her diet in order to follow a diabetic diet more appropriately.  She is encouraged to continue.  She verbalizes concern regarding affordability of appropriate foods.  She is in the process of applying for food stamps.  She is also provided with other resources for food assistance ([employer] - 211).   Her blood pressure today is slightly elevated.  She state that in improving her diet for diabetes, she has also decreased her sodium intake.  She does not have a blood pressure monitor to check daily, this care manager will provide one on next visit.    Member state that she is interested in increasing her activity/exercise, however does not at this time due to having problems with her legs (poor circulation).  She state that she only take her Cilostazol when she is having issues/as needed.  She is made aware that the order states to take twice a day.  She is advised to take as instructed, and to contact her MD if she is uncomfortable with the dosage.  She state she will increase the frequency she is taking it, hoping it will allow her to increase her tolerance of exercise.    She denies any questions at this time.  Advised to contact with concerns.  Plan:   Will follow up with member within the next 4 weeks.  [facility] CM Care Plan Problem One   Flowsheet Row Most Recent Value  Care Plan Problem One  Self-Care Deficit related to diabetes management as evidenced by elevated A1C  Role Documenting the Problem One  Care Management [place] for Problem One  Active  THN Long Term Goal (31-90 days)  Member's A1C will decrease from 12.1 to </= 10 over the next 90 days  THN Long Term Goal Start Date  08/22/16  Interventions for Problem One Long Term Goal  Educated member on ways to decrease A1C,  including diet, exercise, daily blood sugar checks with recording for a visual trend.  THN CM Short Term Goal #1 (0-30 days)  Member will report checking and recording blood sugar at least once daily over the next 4 weeks  THN CM Short Term Goal #1 Start Date  08/22/16  [facility] CM Short Term Goal #1 Met Date  09/19/16  Interventions for Short Term Goal #1  Educated member using teachback on importance of monitoring blood sugar as advised, and how daily monitor aides in better control of blood sugar.  Provided with [facility] calender, which includes signs/symptoms of hypo-hyperglycemia and log for blood sugar checks  THN CM Short  Term Goal #2 (0-30 days)  Member will report understanding of assigned EMMI education for diabetes management over the next 4 weeks  THN CM Short Term Goal #2 Start Date  08/22/16  [facility] CM Short Term Goal #2 Met Date  09/19/16  Interventions for Short Term Goal #2  Member assigned EMMI education on diabetes management.  Advised that she will receive education in the mail as she does not have access to internet/computer    [facility] CM Care Plan Problem Two   Flowsheet Row Most Recent Value  Role Documenting the Problem Two  Care Management [place] for Problem Two  Active  THN CM Short Term Goal #1 (0-30 days)  Member will have working blood pressure monitor within the next 2 weeks  THN CM Short Term Goal #1 Start Date  09/19/16  Interventions for Short Term Goal #2   Member's financial status reviewed, Medicaid patient.  Discussed with member the importance of daily blood pressure checks as she states history of stroke  THN CM Short Term Goal #2 (0-30 days)  Member will check blood pressure daily and record readings over the next 4 weeks  THN CM Short Term Goal #2 Start Date  09/19/16  Interventions for Short Term Goal #2  Member edcuated on the imporatance of daily blood pressure checks.  Educated using teachback method on signs/symptoms of stroke & heart attack.  Educated  on abnormal values of blood pressure.     Valente David, [place], MSN [facility] [phone]

## 2016-09-20 NOTE — Patient Outreach (Signed)
[place] [facility]) Care Management  09/20/2016  [name] 23-Oct-1951 [id number]   Request received from [facility], LCSW and Valente David, RN to mail dental resource information to patient. Information mailed today, 09/20/16  [name], [place] Management Assistant

## 2016-10-04 ENCOUNTER — Telehealth: Payer: Self-pay | Admitting: Dietician

## 2016-10-04 NOTE — Telephone Encounter (Signed)
Thank you, [name].  I will address it with her at her Dec 6th appointment and see if it is possible for her to come in more often and see what medication needs she may have.   [name]

## 2016-10-04 NOTE — Telephone Encounter (Signed)
Calling to follow up on diabetes and blood pressure per the geriatric task force. Got her voicemail and left message. Note patient has [facility] home support and an appointment 10/18/16 here. Suggest she be seen every month in office until her diabetes is better controlled.

## 2016-10-10 ENCOUNTER — Ambulatory Visit: Payer: Self-pay | Admitting: *Deleted

## 2016-10-10 ENCOUNTER — Other Ambulatory Visit: Payer: Self-pay | Admitting: *Deleted

## 2016-10-10 NOTE — Telephone Encounter (Signed)
Called [name] again today to touch base about her blood sugars/diabetes self care. She reports it is about the "same-still high" . She asked to review everything about her diabetes at her appointments next week rather than over the phone. When asked about coning in more frequently ( her last visit was June 2017) , she thought that was a good idea except that she has difficulty with parking and walking the long distance in to our office. I told her how to use the valet parking and ask for a volunteer to bring her to our office. She verbalized understanding and said she would try this.

## 2016-10-10 NOTE — Patient Outreach (Signed)
[place] [facility]) Care Management  10/10/2016  LAM STILES 12-24-1950 [id number]   Call placed to member to confirm home visit for 2 pm.  She state that she is preparing to go to a MD visit.  Visit rescheduled for Thursday.  Valente David, [place], MSN [place] [phone]

## 2016-10-11 ENCOUNTER — Telehealth: Payer: Self-pay

## 2016-10-11 NOTE — Telephone Encounter (Signed)
SOUL EVERIST is a 64 y.o. female who was contacted on behalf of [facility] Geriatrics Task Force. Patient reports that she self-discontinued Metformin and Glipizide due to GI complaints since her last appointment. She reports adherence to all other medications. Ms. Attanasio states that her FBG was 261 this am and that her blood sugar usually ranges in the 200s. She believes that her insulin may cause higher blood sugars because when she does not eat and still takes her insulin her blood sugars remain the same. Advised patient to continue taking her insulin and educated her that the onset of Novolog 70/30 is typically 10-20 minutes. Also provided education to patient on the progressive nature of diabetes and that she may require increased dosing but assured her that she should remain adherent to insulin. Patient verbalized understanding. Ms. Hauger also expressed an interest in a group exercise program with other diabetic patients to help increase her level of activity. She is interested in discussing at follow-up appointment on 10/18/16.

## 2016-10-12 ENCOUNTER — Other Ambulatory Visit: Payer: Self-pay | Admitting: *Deleted

## 2016-10-12 NOTE — Patient Outreach (Signed)
[name]) Care Management   10/12/2016  [name] June 06, 1951 [id number]  [name] is an 65 y.o. female  Subjective:   Member report that she is doing "alright."  She denies any pain or discomfort at this time.  She state that she is in the process of packing up her home preparing to move due to her landlord increasing the rent, stating "I can't even afford to eat now, I can't afford to pay more rent."  She state that she will move in with her friend within the next month.  She state that she has been taking her medications as prescribed, but is not taking Metformin and Glipizide.  She reports compliance with her insulin.  She has been checking her blood sugar daily, remains elevated 150s-300.  She state that she continue to have intermittent pain in her legs, denies taking Pletal as instructed.  Objective:   Review of Systems  Constitutional: Negative.   HENT: Negative.   Eyes: Negative.   Respiratory: Negative.   Cardiovascular: Negative.   Gastrointestinal: Negative.   Genitourinary: Negative.   Musculoskeletal: Negative.   Skin: Negative.   Neurological: Negative.   Endo/Heme/Allergies: Negative.   Psychiatric/Behavioral: Negative.     Physical Exam  Constitutional: She is oriented to person, place, and time. She appears well-developed and well-nourished.  Neck: Normal range of motion.  Cardiovascular: Normal rate, regular rhythm and normal heart sounds.   Respiratory: Effort normal and breath sounds normal.  GI: Soft. Bowel sounds are normal.  Musculoskeletal: Normal range of motion.  Neurological: She is alert and oriented to person, place, and time.  Skin: Skin is warm and dry.   BP (!) 164/76 (BP Location: Left Arm, Patient Position: Sitting, Cuff Size: Normal)   Pulse 79   Resp 18   SpO2 99%   Encounter Medications:   Outpatient Encounter Prescriptions as of 10/12/2016  Medication Sig Note  . aspirin EC 81 MG tablet Take 81 mg by  mouth daily.   . cilostazol (PLETAL) 100 MG tablet Take 1 tablet (100 mg total) by mouth 2 (two) times daily. 08/22/2016: Taking only as needed   . clopidogrel (PLAVIX) 75 MG tablet TAKE 1 TABLET BY MOUTH EVERY MORNING   . gemfibrozil (LOPID) 600 MG tablet TAKE 1 TABLET BY MOUTH TWICE A DAY 09/19/2016: Only take once a day  . glucose blood (ACCU-CHEK AVIVA PLUS) test strip Use to check blood sugars twice a day.Dx Code: E11.22.   . hydrochlorothiazide (HYDRODIURIL) 25 MG tablet Take 1 tablet (25 mg total) by mouth daily.   . insulin aspart protamine- aspart (NOVOLOG MIX 70/30) (70-30) 100 UNIT/ML injection Inject 0.36 mLs (36 Units total) into the skin 2 (two) times daily with a meal.   . Insulin Pen Needle 31G X 6 MM MISC Use to inject insulin twice daily   . Insulin Syringe-Needle U-100 31G X 15/64" 0.5 ML MISC Use to inject insulin two times a day   . levothyroxine (SYNTHROID) 88 MCG tablet Take 1 tablet (88 mcg total) by mouth daily before breakfast.   . losartan (COZAAR) 100 MG tablet Take 1 tablet (100 mg total) by mouth daily.   . ranitidine (ZANTAC) 150 MG tablet TAKE 1 TABLET BY MOUTH EVERY DAY AS NEEDED   . TRADJENTA 5 MG TABS tablet TAKE 1 TABLET BY MOUTH EVERY DAY   . glipiZIDE (GLUCOTROL XL) 5 MG 24 hr tablet Take 1 tablet (5 mg total) by mouth daily with breakfast. (Patient  not taking: Reported on 10/12/2016)   . metFORMIN (GLUCOPHAGE XR) 500 MG 24 hr tablet Take 1 tablet (500 mg total) by mouth daily with breakfast. (Patient not taking: Reported on 10/12/2016)    No facility-administered encounter medications on file as of 10/12/2016.     Functional Status:   In your present state of health, do you have any difficulty performing the following activities: 08/29/2016 08/22/2016  Hearing? N N  Vision? N N  Difficulty concentrating or making decisions? N N  Walking or climbing stairs? N N  Dressing or bathing? N N  Doing errands, shopping? N N  Preparing Food and eating ? N N   Using the Toilet? N N  In the past six months, have you accidently leaked urine? Y Y  Do you have problems with loss of bowel control? N N  Managing your Medications? N N  Managing your Finances? N N  Housekeeping or managing your Housekeeping? N N  Some recent data might be hidden    Fall/Depression Screening:    PHQ 2/9 Scores 08/29/2016 08/10/2016 05/01/2016 10/29/2015 09/17/2015 07/01/2015 06/01/2015  PHQ - 2 Score 0 0 0 0 0 0 0    Assessment:    Glucose logs reviewed, discussed with member the importance of diabetes management and complications of diabetes.  Complications of hypertension also discussed as her blood pressure is uncontrolled.  She state that she is aware of how to manage with diet, but is unable to afford better foods.  She report eating Ramen noodles for breakfast this morning.  She state that she feel she will be able to manage her conditions better once she move.  She will have a safe area to exercise and will be able to use more money for food as she will not be solely responsible of her rent.  She was previously provided with resources for food assistance, which she state she has used and will continue to use.  She state that one barrier to exercise is the pain in her legs from poor circulation.  She was advised to take her Pletal as instructed during last home visit, which she has not.  She is advised again to take as indicated, or at least once a day, to manage her pain in order to increase tolerance for exercise.  She is also advised to take her diabetes medication as instructed, but she state she will discuss with physician and dietician next week during her office visit.  Member denies questions at this time, advised to contact with concerns.    Plan:   Will follow up next month for routine home visit.  [name] CM Care Plan Problem One   Flowsheet Row Most Recent Value  Care Plan Problem One  Self-Care Deficit related to diabetes management as evidenced by elevated  A1C  Role Documenting the Problem One  Care Management [place] for Problem One  Active  THN Long Term Goal (31-90 days)  Member's A1C will decrease from 12.1 to </= 10 over the next 90 days  THN Long Term Goal Start Date  08/22/16  Interventions for Problem One Long Term Goal  Educated member on ways to decrease A1C, including diet, exercise, daily blood sugar checks with recording for a visual trend.    [name] CM Care Plan Problem Two   Flowsheet Row Most Recent Value  Care Plan Problem Two  Knowledge deficit related to blood pressure management as evicenced by uncontrolled blood pressure  Role Documenting the Problem  Two  Care Management Coordinator  Care Plan for Problem Two  Active  THN CM Short Term Goal #1 (0-30 days)  Member will have working blood pressure monitor within the next 2 weeks  THN CM Short Term Goal #1 Start Date  09/19/16  [facility] CM Short Term Goal #1 Met Date   10/12/16  Interventions for Short Term Goal #2   Member's financial status reviewed, Medicaid patient.  Discussed with member the importance of daily blood pressure checks as she states history of stroke  THN CM Short Term Goal #2 (0-30 days)  Member will check blood pressure daily and record readings over the next 4 weeks  THN CM Short Term Goal #2 Start Date  10/12/16  Interventions for Short Term Goal #2  Member provided with blood pressure monitor, educated on correct use, return demonstration noted. Member edcuated on the imporatance of daily blood pressure checks.  Educated using teachback method on signs/symptoms of stroke & heart attack.  Educated on abnormal values of blood pressure.     [name], [place], MSN [place] [phone]

## 2016-10-18 ENCOUNTER — Other Ambulatory Visit: Payer: Self-pay | Admitting: Student in an Organized Health Care Education/Training Program

## 2016-10-18 ENCOUNTER — Encounter: Payer: Self-pay | Admitting: Internal Medicine

## 2016-10-18 ENCOUNTER — Ambulatory Visit (INDEPENDENT_AMBULATORY_CARE_PROVIDER_SITE_OTHER): Payer: Medicare Other | Admitting: Internal Medicine

## 2016-10-18 ENCOUNTER — Ambulatory Visit (INDEPENDENT_AMBULATORY_CARE_PROVIDER_SITE_OTHER): Payer: Medicare Other | Admitting: Dietician

## 2016-10-18 VITALS — BP 138/68 | HR 68 | Temp 97.8°F | Ht 62.0 in | Wt 180.5 lb

## 2016-10-18 DIAGNOSIS — E1122 Type 2 diabetes mellitus with diabetic chronic kidney disease: Secondary | ICD-10-CM

## 2016-10-18 DIAGNOSIS — E038 Other specified hypothyroidism: Secondary | ICD-10-CM

## 2016-10-18 DIAGNOSIS — E782 Mixed hyperlipidemia: Secondary | ICD-10-CM

## 2016-10-18 DIAGNOSIS — E1165 Type 2 diabetes mellitus with hyperglycemia: Secondary | ICD-10-CM | POA: Diagnosis not present

## 2016-10-18 DIAGNOSIS — I129 Hypertensive chronic kidney disease with stage 1 through stage 4 chronic kidney disease, or unspecified chronic kidney disease: Secondary | ICD-10-CM | POA: Diagnosis not present

## 2016-10-18 DIAGNOSIS — Z794 Long term (current) use of insulin: Secondary | ICD-10-CM

## 2016-10-18 DIAGNOSIS — I10 Essential (primary) hypertension: Secondary | ICD-10-CM | POA: Diagnosis not present

## 2016-10-18 DIAGNOSIS — N183 Chronic kidney disease, stage 3 unspecified: Secondary | ICD-10-CM

## 2016-10-18 DIAGNOSIS — Z79899 Other long term (current) drug therapy: Secondary | ICD-10-CM

## 2016-10-18 DIAGNOSIS — Z1211 Encounter for screening for malignant neoplasm of colon: Secondary | ICD-10-CM

## 2016-10-18 DIAGNOSIS — E785 Hyperlipidemia, unspecified: Secondary | ICD-10-CM

## 2016-10-18 DIAGNOSIS — Z713 Dietary counseling and surveillance: Secondary | ICD-10-CM | POA: Diagnosis not present

## 2016-10-18 DIAGNOSIS — Z23 Encounter for immunization: Secondary | ICD-10-CM

## 2016-10-18 DIAGNOSIS — E039 Hypothyroidism, unspecified: Secondary | ICD-10-CM

## 2016-10-18 DIAGNOSIS — Z87891 Personal history of nicotine dependence: Secondary | ICD-10-CM

## 2016-10-18 DIAGNOSIS — Z0000 Encounter for general adult medical examination without abnormal findings: Secondary | ICD-10-CM

## 2016-10-18 LAB — POCT GLYCOSYLATED HEMOGLOBIN (HGB A1C): Hemoglobin A1C: 10.4

## 2016-10-18 LAB — HM DIABETES EYE EXAM

## 2016-10-18 LAB — GLUCOSE, CAPILLARY: Glucose-Capillary: 181 mg/dL — ABNORMAL HIGH (ref 65–99)

## 2016-10-18 MED ORDER — GLIPIZIDE ER 5 MG PO TB24: 5.0000 mg | ORAL_TABLET | Freq: Every day | ORAL | 3 refills | Status: DC

## 2016-10-18 MED ORDER — PIOGLITAZONE HCL 15 MG PO TABS: 15.0000 mg | ORAL_TABLET | Freq: Every day | ORAL | 0 refills | Status: DC

## 2016-10-18 NOTE — Progress Notes (Signed)
  Medical Nutrition Therapy:  Appt start time: [id number] end time:  1410. Visit #4 last visit was 01/2016  Assessment:  Primary concerns today: glycemic control  Ms. Wixom reports eating about the same - lean meats, vegetables, few fruits because they increase her blood sugar and only water to drink. in the am reports taking insulin before eating, but in the PM takes insulin without regard to when she eats. Does not want anymore injections or to change her insulin type or amount or means of administration.    She reports  no low blood sugars.   Meter: much variability range is 105 to 200s most of the time some 300s. No low blood sugar ever.  She mostly cheks her blood sugar in the am  Sleep- gets up hungry and with muscle cramps mid AM Labs: A1C is 10.4% today decreased from 12.1%. Vit D note at 15 Ng, A1C staying ~ 10.4% with triglycerides 585   Weight is stable ~ 180-181# She is not happy with her weight; would like to weigh 170#. MEDICATIONS: Takes insulin 36 units twice daily without fail 12 hours apart at 730 and and 730 PM, does not have trouble with vision or drawing up her insulin, does not want tocnosider any different insulins or means of adminstering insulin. wabnts to keep working with her current insuin and try to time her meals better. Denies taking any pills for her diabetes.    DIETARY INTAKE: Usual eating pattern includes 2 meals and bedtime snack. Everyday foods include chicken, okra, beans, mayonnaise, fruit, soymilk, ice cream.  Avoided foods include regular milk   24hr recall:  Breakfast- 730 am oatmeal ~ 3/4 cup, water Lunch- 4 P.M., chicken/turkey pot pie, water  dinner -6-7 ~ 3 ounces [place] and ~ 3/4 cup green beans, water Snack- 11-12 "starving from my medicine" bran cereal dry or 1/2 bologn sandwi  3am when awakened by hunger or leg/muscle cramps- [place] and oatmeal Beverages- sprite zero, water, no juice,   Usual physical activity:  cleans and does ADLs and watches  TV  Recommend moderate carb, healthy fat, high fiber protein diet for weight loss to help with insulin resistance 1400-1500 calories 165-170 g carbohydrates 85-95 g protein 50 g fat  Progress Towards Goal(s):  In progress.   Nutritional Diagnosis:  NB-1.1 Food and nutrition-related knowledge deficit As related to lack of prior adequate diabetes meal planning is improving  As evidenced by her report and lower blood sugar at today's visit.    Intervention:  Nutrition education about timing of insulin with food.  Coordination of care- suggest Vitamin supplementation as her diet is very low in sources of vitamin D and she does not go outdoors often.   Teaching Method Utilized: Visual, Auditory,Hands on Handouts given during visit include: AVS, What to eat heandout Barriers to learning/adherence to lifestyle change: very limited finances Demonstrated degree of understanding via:  Teach Back   Monitoring/Evaluation:  Dietary intake, exercise, meter, and body weight .- she will schedule on same day she sees doctor [place], [name], [place] 10/18/2016 4:06 PM.

## 2016-10-18 NOTE — Patient Instructions (Signed)
It was great meeting you today!  For your diabetes: Take insulin 35 units twice a day with meals Take Glipizide 5mg  daily with breakfast Take pioglitazone (new medicine) 15mg  daily Take linagliptin 5mg  daily  You got your pneumonia shot today. I have sent in an order for your mammogram. We have provided you with stool cards - please return those so we can screen you for colon cancer.  Please set up an appointment for your retinal scan.  I will call you with results of your blood work.

## 2016-10-18 NOTE — Patient Instructions (Addendum)
It is  VERY Important to take your insulin about the same times every day and to take it about 20 MINUTES BEFORE YOU EAT BREAKFAST AND YOUR EVENING MEAL.    Here is the schedule we talked about today Take insulin - 630 AM  eat breakfast 7 AM-   Eat lunch about    Take Insulin - 630 PM  Eat Dinner 7 PM

## 2016-10-18 NOTE — Progress Notes (Signed)
   CC: diabetes  HPI:  [name] is a 65 y.o. with a PMH of insulin-dependant T2DM, CKD stage 3, HTN, HLD, hypothyroidism presenting to clinic for follow up on T2DM and HTN.   Please see problem based Assessment and Plan for status of patients chronic conditions.  Past Medical History:  Diagnosis Date  . Anemia   . Arteriosclerotic cardiovascular disease   . Claudication in peripheral vascular disease ([place])   . CVA (cerebral vascular accident) ([place]) 01/2011   affected rt side  . Diabetes mellitus   . GERD (gastroesophageal reflux disease)   . Hyperlipidemia   . Hypertension   . Hypocholesterolemia   . Hypothyroidism   . Peripheral vascular disease ([place])   . Renal insufficiency   . Vitamin D deficiency     Review of Systems:   Review of Systems  Constitutional: Negative for chills, fever, malaise/fatigue and weight loss.  HENT: Negative for congestion.   Eyes: Negative for blurred vision and photophobia.  Respiratory: Negative for cough, hemoptysis, sputum production, shortness of breath and wheezing.   Cardiovascular: Negative for chest pain, palpitations, orthopnea and leg swelling.  Gastrointestinal: Negative for abdominal pain, blood in stool, constipation, diarrhea, heartburn, melena, nausea and vomiting.  Genitourinary: Negative for dysuria, flank pain, frequency and hematuria.  Musculoskeletal: Negative for myalgias.  Skin: Negative for rash.  Neurological: Negative for dizziness, tingling, tremors, sensory change, focal weakness, weakness and headaches.  Psychiatric/Behavioral: Negative for depression. The patient is not nervous/anxious.     Physical Exam:  Vitals:   10/18/16 1419 10/18/16 1514  BP: (!) 154/64 138/68  Pulse: 68   Temp: 97.8 F (36.6 C)   TempSrc: Oral   SpO2: 100%   Weight: 180 lb 8 oz (81.9 kg)   Height: 5\' 2"  (1.575 m)    Physical Exam  Constitutional: NAD, pleasant, VS reviewed CV: RRR, no murmurs, rubs or gallops appreciated,  minimal LE edema, pulses intact Resp: CTAB, no increased work of breathing, no wheezing or crackles appreciated Abd: soft, NDNT, +BS MSK: strength and sensation intact  Assessment & Plan:   See Encounters Tab for problem based charting.   Patient seen with Dr. Verdie Drown, MD Internal Medicine PGY1

## 2016-10-19 ENCOUNTER — Telehealth: Payer: Self-pay | Admitting: *Deleted

## 2016-10-19 LAB — LIPID PANEL
CHOLESTEROL TOTAL: 200 mg/dL — AB (ref 100–199)
Chol/HDL Ratio: 5.9 ratio units — ABNORMAL HIGH (ref 0.0–4.4)
HDL: 34 mg/dL — ABNORMAL LOW (ref >39)
LDL Calculated: 99 mg/dL (ref 0–99)
Triglycerides: 337 mg/dL — ABNORMAL HIGH (ref 0–149)
VLDL Cholesterol Cal: 67 mg/dL — ABNORMAL HIGH (ref 5–40)

## 2016-10-19 LAB — TSH: TSH: 0.427 u[IU]/mL — ABNORMAL LOW (ref 0.450–4.500)

## 2016-10-19 NOTE — Addendum Note (Signed)
Addended by: Marcelino Duster on: 10/19/2016 04:42 PM   Modules accepted: Orders

## 2016-10-19 NOTE — Telephone Encounter (Addendum)
Received call from pt with [facility] Pharmacist on the line.  She was trying to reconcile pt's medications and had some questions .  She just wanted to make sure pt understood what meds she's suppose to be taking.     1: she takes cilostazol 100mg  tab as needed for leg pain--is this ok to take prn vs daily vs not at all? 2: should pt be on glipizide, novolog 70/30, and tradjenta (pt told pharmacist that she no longer takes glipizide because she was put on tradjenta  3: was the new rx for pioglitazone replacing another medication?   Will send info to pcp for review, please advise.[name]12/7/20174:41 PM

## 2016-10-20 NOTE — Progress Notes (Signed)
Medicine attending: I personally interviewed and briefly examined this patient on the day of the patient visit and reviewed pertinent clinical ,laboratory, and radiographic data  with resident physician Dr. [name] and we discussed a management plan.

## 2016-10-20 NOTE — Assessment & Plan Note (Signed)
Patient with insulin-dependent T2DM with last A1c 12.4 in 04/2016. Her home regimen is supposed to be glipizide 63m daily, metformin 10048mBID (was titrated up), linagliptin 27m76maily, and novolog mix 70/30 35U BID. Patient has stopped taking glipizide due to causing foul-smelling stools, and metformin due to abdominal discomfort. She states that she is compliant with the insulin and linagliptin. Patient brings in CBG readings which range from 105-300's in the AM (most readings are AM) with no hypoglycemic episodes or readings. She states that she gets up at night around 3am due to hunger and leg discomfort and will eat a full meal. She states that she does not eat scheduled meals, rather eats whenever she is hungry. She is not interested in increasing insulin as she states she has parasthesia's with higher doses; she is willing to restart glipizide.  Plan: --check A1c - 10.4 today improved from previous --continue novolog mix 70/30 35U BID --restart glipizide 27mg4mily and continue linagliptin --start pioglitazone 127mg51mly --patient met with RD/MD educator to discuss --retinal scan today in office --PSV 23 pneumonia vaccine today; declined flu vaccine --foot exam completed today --f[facility]u in 3 months; [facility] --

## 2016-10-20 NOTE — Assessment & Plan Note (Signed)
Patient with hypothyroidism on synthroid replacement without symptoms of hypo or hyperthyroidism.  Plan: --check TSH today - 0.427; mildly decreased could be within lab error especially in setting of no symptoms. --continue current synthroid dose

## 2016-10-20 NOTE — Assessment & Plan Note (Signed)
Patient with hypertriglyceridemia on gemfibrozil 652m BID. Previous LDL was <100  Plan: --repeat lipid panel: LDL within goal, hypertriglyceridemia improved thought still elevated --patient met with RD and is working on losing ~10lbs; provided with education on healthy eating

## 2016-10-20 NOTE — Assessment & Plan Note (Signed)
--  Patient declined flu vaccine. --Patient provided with stool cards. --Order placed for screening mammography. --Patient received PSV 23 pneumonia vaccine.

## 2016-10-20 NOTE — Assessment & Plan Note (Signed)
Patient with well controlled HTN on losartan-HCTZ 100-25mg  daily. On recheck at today's visit her BP was 138/68. She denies chest pain, shortness of breath, headaches or vision changes.  Plan: --continue losartan-HCTZ 100-25mg  daily

## 2016-10-24 NOTE — Telephone Encounter (Signed)
Patient was contacted with Frank Tillman, PharmD candidate. I agree with the assessment and plan of care documented.  

## 2016-10-24 NOTE — Telephone Encounter (Signed)
Erroneous encounter

## 2016-10-25 ENCOUNTER — Other Ambulatory Visit: Payer: Self-pay | Admitting: Internal Medicine

## 2016-11-01 ENCOUNTER — Encounter: Payer: Self-pay | Admitting: Dietician

## 2016-11-02 ENCOUNTER — Telehealth: Payer: Self-pay | Admitting: Internal Medicine

## 2016-11-02 ENCOUNTER — Other Ambulatory Visit (INDEPENDENT_AMBULATORY_CARE_PROVIDER_SITE_OTHER): Payer: Medicare Other

## 2016-11-02 ENCOUNTER — Other Ambulatory Visit: Payer: Self-pay | Admitting: *Deleted

## 2016-11-02 DIAGNOSIS — Z1211 Encounter for screening for malignant neoplasm of colon: Secondary | ICD-10-CM

## 2016-11-02 DIAGNOSIS — N183 Chronic kidney disease, stage 3 unspecified: Secondary | ICD-10-CM

## 2016-11-02 DIAGNOSIS — E1122 Type 2 diabetes mellitus with diabetic chronic kidney disease: Secondary | ICD-10-CM

## 2016-11-02 LAB — POC HEMOCCULT BLD/STL (HOME/3-CARD/SCREEN)
Card #2 Fecal Occult Blod, POC: POSITIVE
FECAL OCCULT BLD: NEGATIVE
FECAL OCCULT BLD: NEGATIVE

## 2016-11-02 MED ORDER — GLIPIZIDE ER 5 MG PO TB24: 10.0000 mg | ORAL_TABLET | Freq: Every day | ORAL | 3 refills | Status: DC

## 2016-11-02 NOTE — Addendum Note (Signed)
Addended by: Truddie Crumble on: 11/02/2016 02:57 PM   Modules accepted: Orders

## 2016-11-02 NOTE — Telephone Encounter (Signed)
Patient contacted about medication question. At first contact stated that she was told not to take Actos (pioglitazone) after an ER visit a while back but does not remember the reason why. Documentation review and medicine review does not reveal a clear contraindication for her to take Pioglitazone and patient was made aware. At second contact patient stated that she took the pioglitazone for about 5 days and each day about 1-2hrs after she took the medicine, she would experience hot flashes and feel like she had a "fire-cracker" inside of her. These symptoms stopped after she discontinued the medicine. She states that she has continued to adhere to her insulin, tradjenta, and glipizide regimen. Advised patient to increase glipizide to 10mg  daily. Patient declined several times an earlier appointment in [facility] for DM management and preferred to only keep appointment with me in March (no earlier appointment times available).  Patient again reminded to take pletal daily and not as a PRN medicine.   Patient agreeable to change and had no further questions. Patient advised to make [facility] appointment before our scheduled appointment if she develops any symptoms she thinks are concerning for a medication SE that way we can evaluate it properly and adjust management if necessary. Patient stated understanding.   DM: -increase glipizide to 10mg  daily -continue tradjenta 5mg  daily -continue Novolog mix 36U BID  [name], MD IMTS - PGY1 Pager [phone]

## 2016-11-02 NOTE — Patient Outreach (Signed)
[facility] ([facility]) Care Management   11/02/2016  Janay D Mcmahen 10/27/1951 [id number]  Judith D Bechtold is an 65 y.o. female  Subjective:   Member report that she is doing "alright."  Denies any pain or discomfort at this time.  Objective:   Review of Systems  Constitutional: Negative.   HENT: Negative.   Eyes: Negative.   Respiratory: Negative.   Cardiovascular: Negative.   Gastrointestinal: Negative.   Genitourinary: Negative.   Musculoskeletal: Negative.   Skin: Negative.   Neurological: Negative.   Endo/Heme/Allergies: Negative.   Psychiatric/Behavioral: Negative.     Physical Exam  Constitutional: She is oriented to person, place, and time. She appears well-developed and well-nourished.  Neck: Normal range of motion.  Cardiovascular: Normal rate, regular rhythm and normal heart sounds.   Respiratory: Effort normal and breath sounds normal.  GI: Soft. Bowel sounds are normal.  Musculoskeletal: Normal range of motion.  Neurological: She is alert and oriented to person, place, and time.  Skin: Skin is warm and dry.   BP (!) 144/76 (BP Location: Left Arm, Patient Position: Sitting, Cuff Size: Normal)   Pulse 68   Resp 20   SpO2 98%   Encounter Medications:   Outpatient Encounter Prescriptions as of 11/02/2016  Medication Sig Note  . aspirin EC 81 MG tablet Take 81 mg by mouth daily.   . cilostazol (PLETAL) 100 MG tablet Take 1 tablet (100 mg total) by mouth 2 (two) times daily. 08/22/2016: Taking only as needed   . clopidogrel (PLAVIX) 75 MG tablet TAKE 1 TABLET BY MOUTH EVERY MORNING   . gemfibrozil (LOPID) 600 MG tablet TAKE 1 TABLET BY MOUTH TWICE A DAY 09/19/2016: Only take once a day  . glipiZIDE (GLUCOTROL XL) 5 MG 24 hr tablet Take 2 tablets (10 mg total) by mouth daily with breakfast.   . glucose blood (ACCU-CHEK AVIVA PLUS) test strip Use to check blood sugars twice a day.Dx Code: E11.22.   . hydrochlorothiazide (HYDRODIURIL) 25 MG tablet Take 1  tablet (25 mg total) by mouth daily.   . insulin aspart protamine- aspart (NOVOLOG MIX 70/30) (70-30) 100 UNIT/ML injection Inject 0.36 mLs (36 Units total) into the skin 2 (two) times daily with a meal.   . Insulin Syringe-Needle U-100 31G X 15/64" 0.5 ML MISC Use to inject insulin two times a day   . levothyroxine (SYNTHROID) 88 MCG tablet Take 1 tablet (88 mcg total) by mouth daily before breakfast.   . losartan (COZAAR) 100 MG tablet Take 1 tablet (100 mg total) by mouth daily.   . ranitidine (ZANTAC) 150 MG tablet TAKE 1 TABLET BY MOUTH EVERY DAY AS NEEDED   . TRADJENTA 5 MG TABS tablet TAKE 1 TABLET BY MOUTH EVERY DAY    No facility-administered encounter medications on file as of 11/02/2016.     Functional Status:   In your present state of health, do you have any difficulty performing the following activities: 08/29/2016 08/22/2016  Hearing? N N  Vision? N N  Difficulty concentrating or making decisions? N N  Walking or climbing stairs? N N  Dressing or bathing? N N  Doing errands, shopping? N N  Preparing Food and eating ? N N  Using the Toilet? N N  In the past six months, have you accidently leaked urine? Y Y  Do you have problems with loss of bowel control? N N  Managing your Medications? N N  Managing your Finances? N N  Housekeeping or managing your Housekeeping?   N N  Some recent data might be hidden    Fall/Depression Screening:    PHQ 2/9 Scores 08/29/2016 08/10/2016 05/01/2016 10/29/2015 09/17/2015 07/01/2015 06/01/2015  PHQ - 2 Score 0 0 0 0 0 0 0    Assessment:    Met with member at scheduled time.  She report that she has been having trouble getting her diabetic shoes approved by the insurance.  She state that she was told that she does not have any coverage.  Assisted member with placing call to insurance company to verify coverage.  Call placed to medical supply store (contact [employer]) while insurance company on phone.  New policy number provided to Mr. [employer]  (he had old member number), he state he will process and provide member with shoes.    Member report that her MD has been altering her medications for diabetes.  She state that her blood sugars have been variable over the past month (range 106-302).  Several readings are over 200.  Although her blood sugars have been uncontrolled, her A1C has decreased.  She is aware of importance of following instructions for diet, exercise, and medication.  She is aware that it was recommended to see the dietician sooner than her scheduled appointment in March, but state that she will wait.  She will continue to check blood sugar and call with concerns.  She report she has not started taking her Pletal as instructed by this care manager and her primary MD, but state she will begin taking today.  She had planned to move from current apartment by 12/15, but state she ran out of money to complete her move.  Her lease is not up until January, she state she will be able to move by the end of her lease.  She denies any further questions, advised to contact with questions.  Plan:   Will follow up within 2 weeks regarding living arrangements.  Will follow up next month with home visit.  [facility] CM Care Plan Problem One   Flowsheet Row Most Recent Value  Care Plan Problem One  Self-Care Deficit related to diabetes management as evidenced by elevated A1C  Role Documenting the Problem One  Care Management Coordinator  Care Plan for Problem One  Active  [facility] Long Term Goal (31-90 days)  Member's A1C will decrease from 10.4 to </= 9 over the next 90 days  [facility] Long Term Goal Start Date  11/02/16  [facility] Long Term Goal Met Date  11/02/16  Interventions for Problem One Long Term Goal  Educated member on ways to decrease A1C, including diet, exercise, daily blood sugar checks with recording for a visual trend.  [facility] CM Short Term Goal #1 (0-30 days)  Member's blood sugar will consistenly be below 200 within the next 4 weeks  [facility] CM  Short Term Goal #1 Start Date  11/02/16  Interventions for Short Term Goal #1  Educated member on correct diet to manage diabetes. Discussed the importance of following instructions of dietcian and taking medications as prescribed in effort to decrease blood sugars.  [facility] CM Short Term Goal #2 (0-30 days)  Member will check blood sugars daily over the next 4 weeks  [facility] CM Short Term Goal #2 Start Date  11/02/16  Interventions for Short Term Goal #2  Member provided with [facility] calendar tool book with blood sugar log.  Confirmed that member has working glucose meter, advised on the importane of daily blood sugar checks in managing diabetes      [facility] CM Care Plan Problem Two   Flowsheet Row Most Recent Value  Care Plan Problem Two  Knowledge deficit related to blood pressure management as evicenced by uncontrolled blood pressure  Role Documenting the Problem Two  Care Management Coordinator  Care Plan for Problem Two  Active  [facility] CM Short Term Goal #1 (0-30 days)  Member will have working blood pressure monitor within the next 2 weeks  [facility] CM Short Term Goal #1 Start Date  09/19/16  [facility] CM Short Term Goal #1 Met Date   10/12/16  Interventions for Short Term Goal #2   Member's financial status reviewed, Medicaid patient.  Discussed with member the importance of daily blood pressure checks as she states history of stroke  [facility] CM Short Term Goal #2 (0-30 days)  Member will check blood pressure daily and record readings over the next 4 weeks  [facility] CM Short Term Goal #2 Start Date  10/12/16  [facility] CM Short Term Goal #2 Met Date  11/02/16  Interventions for Short Term Goal #2  Member provided with blood pressure monitor, educated on correct use, return demonstration noted. Member edcuated on the imporatance of daily blood pressure checks.  Educated using teachback method on signs/symptoms of stroke & heart attack.  Educated on abnormal values of blood pressure.      [place], RN, MSN [facility] Care Management   Community Care Manager [phone]    

## 2016-11-10 ENCOUNTER — Other Ambulatory Visit: Payer: Self-pay | Admitting: Internal Medicine

## 2016-11-10 DIAGNOSIS — E1122 Type 2 diabetes mellitus with diabetic chronic kidney disease: Secondary | ICD-10-CM

## 2016-11-10 DIAGNOSIS — N183 Chronic kidney disease, stage 3 (moderate): Principal | ICD-10-CM

## 2016-11-16 ENCOUNTER — Other Ambulatory Visit: Payer: Self-pay | Admitting: *Deleted

## 2016-11-16 NOTE — Patient Outreach (Signed)
[place] [facility]) Care Management  11/16/2016  [name] 01-28-1951 [id number]   Call placed to member to follow up on current health status and relocation.  She state that she still has not completed her move, denies needing assistance.  She report that she has had to "slow down" due to the weather.  She is aware that she has less than 2 weeks before her lease is up (12/15).  She state that she will be able to be out of her current home and into the new apartment with her friend (new address [address]) by next Friday.  She state that she has continued to monitor her blood sugar during the process but have not been consistent due to "things being all over the place."  She denies any hypoglycemic or extremely hyperglycemic episodes, but she will continue to monitor.  Home visit scheduled within the next 2 weeks.  Valente David, [place], MSN [place] [phone]

## 2016-11-23 ENCOUNTER — Other Ambulatory Visit: Payer: Self-pay | Admitting: Internal Medicine

## 2016-11-23 DIAGNOSIS — N183 Chronic kidney disease, stage 3 (moderate): Principal | ICD-10-CM

## 2016-11-23 DIAGNOSIS — E1122 Type 2 diabetes mellitus with diabetic chronic kidney disease: Secondary | ICD-10-CM

## 2016-11-30 ENCOUNTER — Ambulatory Visit: Payer: Self-pay | Admitting: *Deleted

## 2016-11-30 ENCOUNTER — Other Ambulatory Visit: Payer: Self-pay | Admitting: *Deleted

## 2016-11-30 NOTE — Patient Outreach (Signed)
[place] [facility]) Care Management  11/30/2016  WAYNETTE KELMAN 1951/02/27 [id number]   Home visit scheduled for this afternoon.  Visit canceled due to weather and rescheduled for within 2 weeks.  She state that she still has not completely moved out of her current apartment, but report that her landlord is working with her until she is able to move.  She denies any concerns today, advised to contact with questions.  Valente David, [place], MSN [place] [phone]

## 2016-12-07 ENCOUNTER — Ambulatory Visit
Admission: RE | Admit: 2016-12-07 | Discharge: 2016-12-07 | Disposition: A | Discharge status: Home / Self Care | Payer: Medicare Other | Source: Ambulatory Visit | Attending: Oncology | Admitting: Oncology

## 2016-12-07 DIAGNOSIS — Z0000 Encounter for general adult medical examination without abnormal findings: Secondary | ICD-10-CM

## 2016-12-07 DIAGNOSIS — Z1231 Encounter for screening mammogram for malignant neoplasm of breast: Secondary | ICD-10-CM | POA: Diagnosis not present

## 2016-12-11 ENCOUNTER — Other Ambulatory Visit: Payer: Self-pay | Admitting: *Deleted

## 2016-12-11 ENCOUNTER — Other Ambulatory Visit: Payer: Self-pay | Admitting: Internal Medicine

## 2016-12-11 DIAGNOSIS — I739 Peripheral vascular disease, unspecified: Secondary | ICD-10-CM

## 2016-12-11 NOTE — Patient Outreach (Signed)
[place] [facility]) Care Management   12/11/2016  KAMIYA ACORD 06-21-1951 [id number]  JIM PHILEMON is an 66 y.o. female  Subjective:   Member reports that she is doing "wonderful."  She state that although she has been compliant with her medications, with the exception of her Pletal (only taking as needed), she has not been consistently compliant with other management of her diabetes.  Although she has improved the frequency of blood sugar checks, she has not checked daily.  She state with her diet, she usually "eat until I get full."  She denies any pain or discomfort at this time.  Objective:   Review of Systems  Constitutional: Negative.   HENT: Negative.   Eyes: Negative.   Respiratory: Negative.   Cardiovascular: Negative.   Gastrointestinal: Negative.   Genitourinary: Negative.   Musculoskeletal: Negative.   Skin: Negative.   Neurological: Negative.   Endo/Heme/Allergies: Negative.   Psychiatric/Behavioral: Negative.     Physical Exam  Constitutional: She is oriented to person, place, and time. She appears well-developed and well-nourished.  Neck: Normal range of motion.  Cardiovascular: Normal rate, regular rhythm and normal heart sounds.   Respiratory: Effort normal and breath sounds normal.  GI: Soft. Bowel sounds are normal.  Musculoskeletal: Normal range of motion.  Neurological: She is alert and oriented to person, place, and time.  Skin: Skin is warm and dry.   BP 122/64 (BP Location: Left Arm, Patient Position: Sitting, Cuff Size: Normal)   Pulse 83   Resp 18   Ht 1.575 m (5' 2" )   Wt 180 lb (81.6 kg)   SpO2 98%   BMI 32.92 kg/m   Encounter Medications:   Outpatient Encounter Prescriptions as of 12/11/2016  Medication Sig Note  . aspirin EC 81 MG tablet Take 81 mg by mouth daily.   . BD INSULIN SYRINGE ULTRAFINE 31G X 15/64" 0.5 ML MISC USE TO INJECT INSULIN TWO TIMES A DAY   . cilostazol (PLETAL) 100 MG tablet Take 1 tablet (100 mg  total) by mouth 2 (two) times daily. 08/22/2016: Taking only as needed   . clopidogrel (PLAVIX) 75 MG tablet TAKE 1 TABLET BY MOUTH EVERY MORNING   . gemfibrozil (LOPID) 600 MG tablet TAKE 1 TABLET BY MOUTH TWICE A DAY 09/19/2016: Only take once a day  . glipiZIDE (GLUCOTROL XL) 5 MG 24 hr tablet Take 2 tablets (10 mg total) by mouth daily with breakfast.   . glucose blood (ACCU-CHEK AVIVA PLUS) test strip Use to check blood sugars twice a day.Dx Code: E11.22.   . hydrochlorothiazide (HYDRODIURIL) 25 MG tablet Take 1 tablet (25 mg total) by mouth daily.   . insulin aspart protamine- aspart (NOVOLOG MIX 70/30) (70-30) 100 UNIT/ML injection Inject 0.36 mLs (36 Units total) into the skin 2 (two) times daily with a meal.   . levothyroxine (SYNTHROID) 88 MCG tablet Take 1 tablet (88 mcg total) by mouth daily before breakfast.   . losartan (COZAAR) 100 MG tablet Take 1 tablet (100 mg total) by mouth daily.   . ranitidine (ZANTAC) 150 MG tablet TAKE 1 TABLET BY MOUTH EVERY DAY AS NEEDED   . TRADJENTA 5 MG TABS tablet TAKE 1 TABLET BY MOUTH EVERY DAY    No facility-administered encounter medications on file as of 12/11/2016.     Functional Status:   In your present state of health, do you have any difficulty performing the following activities: 08/29/2016 08/22/2016  Hearing? N N  Vision? N N  Difficulty concentrating or making decisions? N N  Walking or climbing stairs? N N  Dressing or bathing? N N  Doing errands, shopping? N N  Preparing Food and eating ? N N  Using the Toilet? N N  In the past six months, have you accidently leaked urine? Y Y  Do you have problems with loss of bowel control? N N  Managing your Medications? N N  Managing your Finances? N N  Housekeeping or managing your Housekeeping? N N  Some recent data might be hidden    Fall/Depression Screening:    PHQ 2/9 Scores 08/29/2016 08/10/2016 05/01/2016 10/29/2015 09/17/2015 07/01/2015 06/01/2015  PHQ - 2 Score 0 0 0 0 0 0 0     Assessment:    Met with member at scheduled time.  Glucose meter reviewed, last month readings range from 150-275.  She verbalizes understanding of proper diet and management of diabetes, state she will make a better attempt to follow.  Nest appointment with primary MD scheduled for March.  Advised to continue to monitor daily for better management.  She has completed her move to her new residence.  She state that overall she feel much better, stating that she feel she was under more stress under her previous living condition.  She denies concerns at this time, advised to contact with questions.  Plan:   Will follow up next month with routine home visit.  [facility] CM Care Plan Problem One   Flowsheet Row Most Recent Value  Care Plan Problem One  Self-Care Deficit related to diabetes management as evidenced by elevated A1C  Role Documenting the Problem One  Care Management [place] for Problem One  Active  [facility] Long Term Goal (31-90 days)  Member's A1C will decrease from 10.4 to </= 9 over the next 90 days  [facility] Long Term Goal Start Date  11/02/16  Interventions for Problem One Long Term Goal  Educated member on ways to decrease A1C, including diet, exercise, daily blood sugar checks with recording for a visual trend.  [facility] CM Short Term Goal #1 (0-30 days)  Member's blood sugar will consistenly be below 200 within the next 4 weeks  [facility] CM Short Term Goal #1 Start Date  12/11/16 [Goal not met, date reset]  Interventions for Short Term Goal #1  Re-educated member on correct diet to manage diabetes. Discussed the importance of following instructions of dietcian and taking medications as prescribed in effort to decrease blood sugars.  [facility] CM Short Term Goal #2 (0-30 days)  Member will check blood sugars daily over the next 4 weeks  [facility] CM Short Term Goal #2 Start Date  12/11/16 [Goal not met, date reset]  Interventions for Short Term Goal #2  Glucose log reviewed, re-educated on the  importance of daily glucose checks in effort to adequately manage diabetes     Valente David, Therapist, sports, MSN [place] Manager [phone]

## 2016-12-15 ENCOUNTER — Other Ambulatory Visit: Payer: Self-pay | Admitting: Internal Medicine

## 2016-12-29 ENCOUNTER — Other Ambulatory Visit: Payer: Self-pay | Admitting: Pharmacist

## 2016-12-29 DIAGNOSIS — I10 Essential (primary) hypertension: Secondary | ICD-10-CM

## 2016-12-29 MED ORDER — HYDROCHLOROTHIAZIDE 25 MG PO TABS: 25.0000 mg | ORAL_TABLET | Freq: Every day | ORAL | 2 refills | Status: DC

## 2017-01-08 ENCOUNTER — Telehealth: Payer: Self-pay | Admitting: Dietician

## 2017-01-08 ENCOUNTER — Other Ambulatory Visit: Payer: Self-pay | Admitting: *Deleted

## 2017-01-08 NOTE — Patient Outreach (Signed)
[facility]) Care Management   01/08/2017  [name] 14-Oct-1951 [id number]  [name] is an 66 y.o. female  Subjective:   Member report that she is "doing pretty good."  She denies pain or discomfort, reports compliance with medications.  She has been checking her blood sugar daily, readings remains elevated despite medication compliance and attempting to change her diet.  She state that she has changed her eating habits, eating several smaller meals/day, with more fruits and vegetables.  Voice frustration of consistently elevated blood sugars, 145-335 over the last month.   Objective:   Review of Systems  Constitutional: Negative.   HENT: Negative.   Eyes: Negative.   Respiratory: Negative.   Cardiovascular: Negative.   Gastrointestinal: Negative.   Genitourinary: Negative.   Musculoskeletal: Negative.   Skin: Negative.   Neurological: Negative.   Endo/Heme/Allergies: Negative.   Psychiatric/Behavioral: Negative.     Physical Exam  Constitutional: She is oriented to person, place, and time. She appears well-developed and well-nourished.  Neck: Normal range of motion.  Cardiovascular: Normal rate, regular rhythm and normal heart sounds.   Respiratory: Effort normal and breath sounds normal.  GI: Soft. Bowel sounds are normal.  Musculoskeletal: Normal range of motion.  Neurological: She is alert and oriented to person, place, and time.  Skin: Skin is warm and dry.   BP 138/80 (BP Location: Left Arm, Patient Position: Sitting, Cuff Size: Normal)   Pulse 73   Resp 18   SpO2 97%   Encounter Medications:   Outpatient Encounter Prescriptions as of 01/08/2017  Medication Sig Note  . aspirin EC 81 MG tablet Take 81 mg by mouth daily.   . BD INSULIN SYRINGE ULTRAFINE 31G X 15/64" 0.5 ML MISC USE TO INJECT INSULIN TWO TIMES A DAY   . cilostazol (PLETAL) 100 MG tablet TAKE 1 TABLET BY MOUTH TWICE A DAY 01/08/2017: Taking PRN  . clopidogrel (PLAVIX) 75 MG  tablet TAKE 1 TABLET BY MOUTH EVERY MORNING   . gemfibrozil (LOPID) 600 MG tablet TAKE 1 TABLET BY MOUTH TWICE A DAY   . glipiZIDE (GLUCOTROL XL) 5 MG 24 hr tablet Take 2 tablets (10 mg total) by mouth daily with breakfast.   . glucose blood (ACCU-CHEK AVIVA PLUS) test strip Use to check blood sugars twice a day.Dx Code: E11.22.   . hydrochlorothiazide (HYDRODIURIL) 25 MG tablet Take 1 tablet (25 mg total) by mouth daily.   . insulin aspart protamine- aspart (NOVOLOG MIX 70/30) (70-30) 100 UNIT/ML injection Inject 0.36 mLs (36 Units total) into the skin 2 (two) times daily with a meal.   . levothyroxine (SYNTHROID) 88 MCG tablet Take 1 tablet (88 mcg total) by mouth daily before breakfast.   . losartan (COZAAR) 100 MG tablet Take 1 tablet (100 mg total) by mouth daily.   . ranitidine (ZANTAC) 150 MG tablet TAKE 1 TABLET BY MOUTH EVERY DAY AS NEEDED   . TRADJENTA 5 MG TABS tablet TAKE 1 TABLET BY MOUTH EVERY DAY    No facility-administered encounter medications on file as of 01/08/2017.     Functional Status:   In your present state of health, do you have any difficulty performing the following activities: 08/29/2016 08/22/2016  Hearing? N N  Vision? N N  Difficulty concentrating or making decisions? N N  Walking or climbing stairs? N N  Dressing or bathing? N N  Doing errands, shopping? N N  Preparing Food and eating ? N N  Using the Toilet? N  N  In the past six months, have you accidently leaked urine? Y Y  Do you have problems with loss of bowel control? N N  Managing your Medications? N N  Managing your Finances? N N  Housekeeping or managing your Housekeeping? N N  Some recent data might be hidden    Fall/Depression Screening:    PHQ 2/9 Scores 08/29/2016 08/10/2016 05/01/2016 10/29/2015 09/17/2015 07/01/2015 06/01/2015  PHQ - 2 Score 0 0 0 0 0 0 0    Assessment:    Discussed diabetes management with member.  Discussed the potential of seeing an endocrinologist, she state that  this is not an option for her.  "They not going to do anything but increase my insulin, and I'm not taking anymore insulin."  She confirms that she has an appointment next week with her primary MD as well as the dietician to further discuss management.    Member has concern regarding her current coverage as she was told that she no longer has benefits.  She state that she received a call switching her HMO to a PPO, but would like to remain HMO.  Call placed to insurance company, member re-registered for her HMO.  Also assisted member with a call to DSS to change listed address.    She denies any further questions at this time.  Advised to contact with concerns.  Plan:   Will follow up with member for routine home visit next month.  [facility] CM Care Plan Problem One   Flowsheet Row Most Recent Value  Care Plan Problem One  Self-Care Deficit related to diabetes management as evidenced by elevated A1C  Role Documenting the Problem One  Care Management [place] for Problem One  Active  THN Long Term Goal (31-90 days)  Member's A1C will decrease from 10.4 to </= 9 over the next 90 days  THN Long Term Goal Start Date  11/02/16  Interventions for Problem One Long Term Goal  Educated member on ways to decrease A1C, including diet, exercise, daily blood sugar checks with recording for a visual trend.  THN CM Short Term Goal #1 (0-30 days)  Member's blood sugar will consistenly be below 200 within the next 4 weeks  THN CM Short Term Goal #1 Start Date  01/08/17 [Goal not met, date reset]  Interventions for Short Term Goal #1  Re-educated member on correct diet to manage diabetes. Discussed the importance of following instructions of dietcian and taking medications as prescribed in effort to decrease blood sugars.  Discussed the importance of keeping and attending appointment with dietician and primary MD.  [facility] CM Short Term Goal #2 (0-30 days)  Member will check blood sugars daily over the next 4  weeks  THN CM Short Term Goal #2 Start Date  12/11/16 [Goal not met, date reset]  THN CM Short Term Goal #2 Met Date  01/08/17  Interventions for Short Term Goal #2  Glucose log reviewed, re-educated on the importance of daily glucose checks in effort to adequately manage diabetes     Valente David, Therapist, sports, MSN [place] Manager [phone]

## 2017-01-08 NOTE — Telephone Encounter (Signed)
[name] is a 66 y.o. female who was unable to be reached today  on behalf of [facility] Geriatrics Task Force. Will try back tomorrow

## 2017-01-09 ENCOUNTER — Other Ambulatory Visit: Payer: Self-pay | Admitting: Student in an Organized Health Care Education/Training Program

## 2017-01-09 DIAGNOSIS — I10 Essential (primary) hypertension: Secondary | ICD-10-CM

## 2017-01-11 NOTE — Telephone Encounter (Signed)
[name] [name] is a 66 y.o. female who was contacted on behalf of [facility] Geriatrics Task Force.   Diabetes Assessment  DM meds and BS checks -  "What medications are you taking for diabetes?" novolog Mix 70/30 36 units twice a day and pills -  "How often are you checking your blood sugars at home?" once a day in the morning, DISLIKES CHECKING BLOOD SUGAR due to sore fingers - "What have your blood sugars been?" " JUST LIKE THEY HAVE BEEN- HIGH!" 250-300 fasting- she says she has tried to omit her bedtime snack and  it does not matter if she eats a bedtime snack or not, it is still high, she wakes up starving. She denies symptoms of high or low blood sugar.    Coping with DM  - What else are you doing to help yourself with your DM  - Diet?  I try to eat not a lot of sugar, only drinks water and sprite zero.Tries to limit bread, eats mostly beans and vegetables, more chicken, little beef or pork, uses canola oil.   - Exercise? Climbing stairs and walking several days a week  She verbalizes frustration of doing what she is supposed to do and her blood sugars are still "HIGH!"  Medication Access Issues  Medication Issues? -  "Are you getting your medicines refilled on time without skipping any doses?" yes - "Are you having any problems getting/taking your medicines? - cost, timing, transportation?" no problems reported  Conclusion  Close the call - Confirmed importance and date/time of follow-up visits scheduled. Expressed empathy at her frustration.  Made an appointment with myself after her appointment with her doctor next week. She agreed to check blood sugar before meals and bedtime for 3 days prior to her visit next week and record in her book - "Any other questions or concerns?" "no mam" -   [name], [name] 01/11/2017 11:09 AM.

## 2017-01-15 ENCOUNTER — Telehealth: Payer: Self-pay | Admitting: Internal Medicine

## 2017-01-15 NOTE — Telephone Encounter (Signed)
APT. REMINDER CALL, LMTCB °

## 2017-01-16 ENCOUNTER — Ambulatory Visit (INDEPENDENT_AMBULATORY_CARE_PROVIDER_SITE_OTHER): Payer: Medicare Other | Admitting: Dietician

## 2017-01-16 ENCOUNTER — Ambulatory Visit (INDEPENDENT_AMBULATORY_CARE_PROVIDER_SITE_OTHER): Payer: Medicare Other | Admitting: Internal Medicine

## 2017-01-16 ENCOUNTER — Encounter: Payer: Self-pay | Admitting: Internal Medicine

## 2017-01-16 VITALS — BP 137/62 | HR 61 | Temp 98.1°F | Ht 62.0 in | Wt 179.8 lb

## 2017-01-16 DIAGNOSIS — Z87891 Personal history of nicotine dependence: Secondary | ICD-10-CM | POA: Diagnosis not present

## 2017-01-16 DIAGNOSIS — I129 Hypertensive chronic kidney disease with stage 1 through stage 4 chronic kidney disease, or unspecified chronic kidney disease: Secondary | ICD-10-CM | POA: Diagnosis not present

## 2017-01-16 DIAGNOSIS — R195 Other fecal abnormalities: Secondary | ICD-10-CM

## 2017-01-16 DIAGNOSIS — E785 Hyperlipidemia, unspecified: Secondary | ICD-10-CM

## 2017-01-16 DIAGNOSIS — Z79899 Other long term (current) drug therapy: Secondary | ICD-10-CM

## 2017-01-16 DIAGNOSIS — K219 Gastro-esophageal reflux disease without esophagitis: Secondary | ICD-10-CM | POA: Diagnosis not present

## 2017-01-16 DIAGNOSIS — Z7902 Long term (current) use of antithrombotics/antiplatelets: Secondary | ICD-10-CM

## 2017-01-16 DIAGNOSIS — Z794 Long term (current) use of insulin: Secondary | ICD-10-CM | POA: Diagnosis not present

## 2017-01-16 DIAGNOSIS — R1319 Other dysphagia: Secondary | ICD-10-CM

## 2017-01-16 DIAGNOSIS — N183 Chronic kidney disease, stage 3 (moderate): Principal | ICD-10-CM

## 2017-01-16 DIAGNOSIS — E890 Postprocedural hypothyroidism: Secondary | ICD-10-CM

## 2017-01-16 DIAGNOSIS — I10 Essential (primary) hypertension: Secondary | ICD-10-CM

## 2017-01-16 DIAGNOSIS — R1310 Dysphagia, unspecified: Secondary | ICD-10-CM

## 2017-01-16 DIAGNOSIS — E669 Obesity, unspecified: Secondary | ICD-10-CM | POA: Diagnosis not present

## 2017-01-16 DIAGNOSIS — E038 Other specified hypothyroidism: Secondary | ICD-10-CM | POA: Diagnosis not present

## 2017-01-16 DIAGNOSIS — E1165 Type 2 diabetes mellitus with hyperglycemia: Secondary | ICD-10-CM | POA: Diagnosis not present

## 2017-01-16 DIAGNOSIS — E1122 Type 2 diabetes mellitus with diabetic chronic kidney disease: Secondary | ICD-10-CM | POA: Diagnosis not present

## 2017-01-16 DIAGNOSIS — E782 Mixed hyperlipidemia: Secondary | ICD-10-CM

## 2017-01-16 DIAGNOSIS — Z6832 Body mass index (BMI) 32.0-32.9, adult: Secondary | ICD-10-CM

## 2017-01-16 DIAGNOSIS — E039 Hypothyroidism, unspecified: Secondary | ICD-10-CM | POA: Diagnosis not present

## 2017-01-16 DIAGNOSIS — I69391 Dysphagia following cerebral infarction: Secondary | ICD-10-CM | POA: Insufficient documentation

## 2017-01-16 DIAGNOSIS — Z8673 Personal history of transient ischemic attack (TIA), and cerebral infarction without residual deficits: Secondary | ICD-10-CM

## 2017-01-16 DIAGNOSIS — Z0000 Encounter for general adult medical examination without abnormal findings: Secondary | ICD-10-CM

## 2017-01-16 LAB — GLUCOSE, CAPILLARY: Glucose-Capillary: 179 mg/dL — ABNORMAL HIGH (ref 65–99)

## 2017-01-16 LAB — POCT GLYCOSYLATED HEMOGLOBIN (HGB A1C): Hemoglobin A1C: 9.8

## 2017-01-16 MED ORDER — EMPAGLIFLOZIN 10 MG PO TABS: 10.0000 mg | ORAL_TABLET | Freq: Every day | ORAL | 2 refills | Status: DC

## 2017-01-16 NOTE — Progress Notes (Signed)
Patient seen today in covisit with Dr. Jari Favre. [name] says she'd like her blood sugars to be mostly in the 120 to 200mg /dl range.   Her A1C has decreased to 9.8% that is equivalent to about a glucose range of 200-280. She denies symptoms of hypoglycemia and complains often of being hungry. She does not want to change her insulin, but says she was wondering if her insulin is not as effective as it should be. Her weight is stable. Her blood sugar was 179 today in office after only eating cucumbers, 2 crackers with peanut butter and coffee at 9 Am and taking 36 units of Novolog Mix 70/30 at 730 AM. She is waking at night with hunger but not checking her blood sugar. Note that [name] was started today.   Dr. Jari Favre requested CGM to determine what her blood sugar pattern is throughout the day and night. Freestyle Libre Pro CGM sensor placed and started. Patient was educated about wearing sensor, keeping food, activity and medication log and when to call office. Follow up was arranged with the patient in 1 week to see Dr. Jari Favre and myself. [name] verbalized understanding.   Plyler, [name], [place] 01/16/2017 3:04 PM.

## 2017-01-16 NOTE — Patient Instructions (Signed)
For your diabetes take the following medications: --insulin mix 35 units twice a day (once with breakfast, once with dinner) --linagliptin 5mg  once a day --pioglitazone 15mg  once a day --new medicine, empagliflozin 10mg  once a day --stop glipizide  Please set up appointment for your pap smear and I will see you in about 3 months

## 2017-01-16 NOTE — Patient Instructions (Addendum)
Please make an appointment for 1 week with Dr. Jari Favre and myself.

## 2017-01-16 NOTE — Progress Notes (Addendum)
CC: diabetes  HPI:  [name] is a 66 y.o. with a PMH of insulin-dependent T2DM, CKD stage 3, HTN, HLD, hypothyroidism presenting to clinic for follow up on her T2DM and HTN.  T2DM: Patient on novolog mix 70/30 35U BID, glipizide 10mg  daily, linagliptin 5mg  daily and pioglitazone 15mg  daily. Patient does not check her CBG's regularly but does have some readings; she states that her AM cbgs range from 170-350. She denies nausea, vomiting, dizziness, diaphoresis, blurry vision, polydipsia, polyuria. Patient states that she takes her morning insulin with breakfast and her evening dose about 2 hr after dinner. She states that she consistently will wake up around 2 AM due to hunger; she does not check her CBGs during these events and denies other symptoms of hypoglycemia.   HTN: Patient on losartan 100 mg daily and HCTZ 25 mg daily. Patient does not check her blood pressure home. She denies chest pain, shortness of breath, headache, vision or hearing change.  Hyperlipidemia: Patient on gemfibrozil. She has had myalgias with statins in the past and does not want to attempt restarting statins even at a lower dose. She also declines zetia.  Dysphasia: Patient endorses several month history of feeling of pills getting stuck in her throat in 1 spot particularly. She feels that if she pushes on that spot it helps dislodge the pill. Patient states that since starting to take her pills with applesauce this seems to be helping but she is still having occasional recurrence of the feeling. She denies similar symptoms with foods or liquids. She denies odynophagia, heartburn, bitter taste in mouth, abdominal pain, nausea, vomiting.  Health maintenance: Patient has been due for colonoscopy; she had a colonoscopy set up in 2014 though this was canceled. Initial endorse concerned about patient being on 2 antiplatelets but this seemed to have been cleared by Dr. Einar Gip who was the patient's cardiologist at the  time. Patient was concerned about pursuing another colonoscopy due to the increased risk of bleeding and increased risk of stroke with holding Plavix. Patient has been using stool cards; last cards returned in December and 1 out of 3 was FOBT positive. Patient denies hematochezia, melena, change in stool caliber, change in bowel habits, family history of GI cancers. She denies fevers, weight loss, change in appetite.  Patient had a negative mammogram in December 2017; she had her annual diabetic ophthalmology exam which did not show retinopathy. Patient is due for Pap smear but would prefer to schedule separate appointment for it.  Please see problem based Assessment and Plan for status of patients chronic conditions.  Past Medical History:  Diagnosis Date  . Anemia   . Arteriosclerotic cardiovascular disease   . Claudication in peripheral vascular disease ([place])   . CVA (cerebral vascular accident) ([place]) 01/2011   affected rt side  . Diabetes mellitus   . GERD (gastroesophageal reflux disease)   . Hyperlipidemia   . Hypertension   . Hypocholesterolemia   . Hypothyroidism   . Peripheral vascular disease ([place])   . Renal insufficiency   . Vitamin D deficiency     Review of Systems:   Review of Systems  Constitutional: Negative for chills, fever, malaise/fatigue and weight loss.  HENT: Negative for hearing loss.   Eyes: Negative for blurred vision and double vision.  Respiratory: Negative for cough, hemoptysis and shortness of breath.   Cardiovascular: Negative for chest pain, palpitations and leg swelling.  Gastrointestinal: Negative for abdominal pain, blood in stool, constipation, diarrhea, heartburn, melena,  nausea and vomiting.  Genitourinary: Negative for frequency.  Musculoskeletal: Negative for falls and myalgias.  Skin: Negative for rash.  Neurological: Negative for dizziness, tingling, sensory change, focal weakness and headaches.  Endo/Heme/Allergies: Negative for  polydipsia.    Physical Exam:  Vitals:   01/16/17 1431 01/16/17 1549  BP: (!) 142/54 137/62  Pulse: 68 61  Temp: 98.1 F (36.7 C)   TempSrc: Oral   SpO2: 100%   Weight: 179 lb 12.8 oz (81.6 kg)   Height: 5\' 2"  (1.575 m)    Physical Exam  Constitutional: She is oriented to person, place, and time. She appears well-developed and well-nourished. No distress.  HENT:  Head: Normocephalic and atraumatic.  Eyes: Conjunctivae and EOM are normal.  Neck: Normal range of motion. Neck supple.  Cardiovascular: Normal rate, regular rhythm, normal heart sounds and intact distal pulses.  Exam reveals no gallop and no friction rub.   No murmur heard. Pulmonary/Chest: Effort normal and breath sounds normal. She has no wheezes. She has no rales.  Abdominal: Soft. Bowel sounds are normal. She exhibits no distension and no mass. There is no tenderness. There is no guarding.  Musculoskeletal: Normal range of motion. She exhibits no edema.  Lymphadenopathy:    She has no cervical adenopathy.  Neurological: She is alert and oriented to person, place, and time. No cranial nerve deficit. Coordination normal.  Skin: Skin is warm and dry. Capillary refill takes less than 2 seconds. She is not diaphoretic. No erythema.  Psychiatric: She has a normal mood and affect. Her behavior is normal. Judgment and thought content normal.    Assessment & Plan:   See Encounters Tab for problem based charting.   Patient discussed with Dr. Abelardo Diesel, MD Internal Medicine PGY1

## 2017-01-17 ENCOUNTER — Telehealth: Payer: Self-pay | Admitting: *Deleted

## 2017-01-17 LAB — TSH: TSH: 0.818 u[IU]/mL (ref 0.450–4.500)

## 2017-01-17 NOTE — Telephone Encounter (Addendum)
Information was sent to [facility] for PA for Humolog Mix 70/30.  Awaiting decision from patient's insurance company.  Sharol Harness 01/17/2017 4:44 PM Prior Authorization for Novolog Mix 70/30 approved under patient's Medicare Part D through 11/12/2017.  Sander Nephew, RN 01/23/2017 4:21 PM

## 2017-01-18 ENCOUNTER — Encounter: Payer: Self-pay | Admitting: Internal Medicine

## 2017-01-18 DIAGNOSIS — R195 Other fecal abnormalities: Secondary | ICD-10-CM | POA: Insufficient documentation

## 2017-01-18 NOTE — Assessment & Plan Note (Addendum)
Patient endorses several month history of feeling of pills getting stuck in her throat in 1 spot particularly. She feels that if she pushes on that spot it helps dislodge the pill. Patient states that since starting to take her pills with applesauce this seems to be helping but she still has occasional recurrence of the feeling. She denies similar symptoms with foods or liquids. She denies odynophagia, heartburn, bitter taste in mouth, abdominal pain, nausea, vomiting or regurgitation.   Patient does have risk factors of smoking (quit 2012 after CVA) and obesity.  Differential diagnosis includes strictures, webs, mass.  Plan -GI referral for EGD

## 2017-01-18 NOTE — Assessment & Plan Note (Addendum)
Patient on novolog mix 70/30 35U BID, glipizide 10mg  daily, linagliptin 5mg  daily and pioglitazone 15mg  daily. Patient does not check her CBG's regularly but does have some readings; she states that her AM cbgs range from 170-350. She denies nausea, vomiting, dizziness, diaphoresis, blurry vision, polydipsia, polyuria. Patient states that she takes her morning insulin with breakfast and her evening dose about 2 hr after dinner. She states that she consistently will wake up around 2 AM due to hunger; she does not check her CBGs during these events and denies other symptoms of hypoglycemia.  His difficult to adjust her regimen at this time due to needing information on how her CBGs run throughout the day. I'm concerned that she is having early morning hypoglycemia that is waking her up.  Plan -Instructed patient to take her NovoLog with breakfast and with dinner -Continue current dose of NovoLog mix 70/30 35 units twice a day with meals, glipizide 10 mg daily, linagliptin 5 mg daily and pioglitazone 50 mg daily -Start empagliflozin 10 mg daily -Referral to nutrition and diabetes services for continuous glucose monitoring -Follow-up in one week for first download

## 2017-01-18 NOTE — Assessment & Plan Note (Signed)
Patient states that her acid reflux symptoms are well controlled with ranitidine. As discussed elsewhere she has been experiencing some dysphagia without odynophagia to only pills.  Plan -Continue Ranitidine -GI referral for EGD for dysphagia

## 2017-01-18 NOTE — Assessment & Plan Note (Signed)
Patient on gemfibrozil. She has had myalgias with statins in the past and does not want to attempt restarting statins even at a lower dose. She also declines zetia.  Plan -Continue gemfibrozil -Continue offering antihyperlipidemics to decrease her risk of stroke and cardiac events

## 2017-01-18 NOTE — Assessment & Plan Note (Addendum)
Patient with hypothyroidism secondary to radioactive ablation of thyroid for Graves' disease. Patient on Synthroid 88 g. Last TSH was slightly low. Patient denies symptoms of hyper or hypothyroidism at this time.  Plan -Recheck TSH today-within normal limits -Continue current regimen of Synthroid 88 g daily

## 2017-01-18 NOTE — Assessment & Plan Note (Addendum)
Patient has been due for colonoscopy; she had a colonoscopy set up in 2014 though this was canceled. Initial endorse concerned about patient being on 2 antiplatelets but this seemed to have been cleared by Dr. Einar Gip who was the patient's cardiologist at time. Patient was concerned about pursuing another colonoscopy due to the increased risk of bleeding and increased risk of stroke with holding Plavix. Patient has been using stool cards; last cards returned in December and 1 out of 3 was FOBT positive. Patient denies hematochezia, melena, change in stool caliber, change in bowel habits, family history of GI cancers. She denies fevers, weight loss, change in appetite.  Plan -Refer to GI for colonoscopy -Educated to hold Plavix 3-4 days prior to procedure

## 2017-01-18 NOTE — Assessment & Plan Note (Signed)
Patient on losartan 100 mg daily and HCTZ 25 mg daily. Patient does not check her blood pressure home. She denies chest pain, shortness of breath, headache, vision or hearing change. Blood pressure on repeat in the clinic today is 137/62.  Plan -Continue losartan 100 mg daily and HCTZ 25 mg daily

## 2017-01-18 NOTE — Assessment & Plan Note (Signed)
Patient had a negative mammogram in December 2017; she had her annual diabetic ophthalmology exam which did not show retinopathy. Patient is due for Pap smear but would prefer to schedule separate appointment for it.  Plan -Set up appointment for screening Pap smear with HPV co-testing

## 2017-01-19 NOTE — Progress Notes (Signed)
Internal Medicine Clinic Attending  Case discussed with Dr. Svalina  at the time of the visit.  We reviewed the resident's history and exam and pertinent patient test results.  I agree with the assessment, diagnosis, and plan of care documented in the resident's note.  

## 2017-01-23 ENCOUNTER — Ambulatory Visit (INDEPENDENT_AMBULATORY_CARE_PROVIDER_SITE_OTHER): Payer: Medicare Other | Admitting: Dietician

## 2017-01-23 ENCOUNTER — Ambulatory Visit (INDEPENDENT_AMBULATORY_CARE_PROVIDER_SITE_OTHER): Payer: Medicare Other | Admitting: Internal Medicine

## 2017-01-23 VITALS — BP 153/53 | HR 71 | Temp 97.7°F | Ht 63.5 in | Wt 178.2 lb

## 2017-01-23 DIAGNOSIS — Z794 Long term (current) use of insulin: Secondary | ICD-10-CM

## 2017-01-23 DIAGNOSIS — N183 Chronic kidney disease, stage 3 unspecified: Secondary | ICD-10-CM

## 2017-01-23 DIAGNOSIS — E1165 Type 2 diabetes mellitus with hyperglycemia: Secondary | ICD-10-CM

## 2017-01-23 DIAGNOSIS — E1122 Type 2 diabetes mellitus with diabetic chronic kidney disease: Secondary | ICD-10-CM | POA: Diagnosis not present

## 2017-01-23 DIAGNOSIS — Z713 Dietary counseling and surveillance: Secondary | ICD-10-CM | POA: Diagnosis not present

## 2017-01-23 MED ORDER — GLIPIZIDE ER 10 MG PO TB24: 10.0000 mg | ORAL_TABLET | Freq: Every day | ORAL | 1 refills | Status: DC

## 2017-01-23 NOTE — Progress Notes (Signed)
  Medical Nutrition Therapy:  Appt start time: 5883 end time:  1600. Visit #5 last visit was 10/18/16  Assessment:  Primary concerns today: glycemic control and improve constipation Ms. Cochrane has been wearing the Freestyle libre Pro CGM for the past week and keeping food records. She says her activity is very little other than ADLs because of the cold and wet weather.     She reports  no low blood sugars.   CGM download showed two fried foods at one meal and taking her insulin after the meal increase her blood sugars for the next 12+ hours. It also showed that she had a low blood sugar after eating a well balanced meal with no fried foods. The trend shows increasing blood sugar in am then dips in PM.  estimated A1C 8.5%, average 196, 39% in target range, 61% above target range.   Food intake: She is eating minimal amounts of fiber and hot dogs and bologna and snacks on crackers that are processed and high in saturated fat.    Weight is stable ~ 178.2# She is not happy with her weight; would like to weigh 170#. MEDICATIONS: Takes insulin 36 units twice daily  12 hours apart at 730 and and 730 PM, for the past week has been taking 5 mg glipizide XL.   Usual physical activity:  cleans and does ADLs and watches TV  Recommend moderate carb, healthy fat, high fiber protein diet for weight loss to help with insulin resistance 1400-1500 calories 165-170 g carbohydrates 85-95 g protein 50 g fat  Progress Towards Goal(s):  In progress.   Nutritional Diagnosis:  NB-1.1 Food and nutrition-related knowledge deficit As related to lack of prior adequate diabetes meal planning is improving  As evidenced by her report and lower blood sugar at today's visit.    Intervention:  Nutrition education about how her food and medicine affected her blood sugar over the past week. Education about rational e and foods to include to increase her fiber and decrease saturated fats in food.  Coordination of care- suggest  Vitamin supplementation as her diet is very low in sources of vitamin D and she does not go outdoors often.  Suggest regular glipizide with am insulin ~ 30 minutes before her meal.  Teaching Method Utilized: Visual, Auditory,Hands on Handouts given during visit include: AVS, What to eat heandout Barriers to learning/adherence to lifestyle change: very limited finances Demonstrated degree of understanding via:  Teach Back   Monitoring/Evaluation:  Dietary intake, exercise, meter, and body weight .- she will schedule on same day she sees doctor Plyler, [name], [place] 01/23/2017 4:27 PM.

## 2017-01-23 NOTE — Patient Instructions (Addendum)
Increase your fiber and plant protein.  Your new food groups are chicken, [place] fish, beans, fruit, veggies and whole grains.   Try to eat grains with at least 5 grams of fiber or more.

## 2017-01-23 NOTE — Progress Notes (Signed)
   CC: DM  HPI:  Ms.Delesa D Nieland is a 66 y.o. with PMHx as outlined below who presents to clinic for diabetes follow up. Please see problem list for further details of patient's chronic medical issues.   Past Medical History:  Diagnosis Date  . Anemia   . Arteriosclerotic cardiovascular disease   . Claudication in peripheral vascular disease ([place])   . CVA (cerebral vascular accident) ([place]) 01/2011   affected rt side  . Diabetes mellitus   . GERD (gastroesophageal reflux disease)   . Hyperlipidemia   . Hypertension   . Hypocholesterolemia   . Hypothyroidism   . Peripheral vascular disease ([place])   . Renal insufficiency   . Vitamin D deficiency     Review of Systems:  Denies lightheadedness, nausea/ vomiting or dizziness. Not having any diaphoresis or abdominal pain.  Physical Exam:  Vitals:   01/23/17 1438  BP: (!) 153/53  Pulse: 71  Temp: 97.7 F (36.5 C)  TempSrc: Oral  SpO2: 100%  Weight: 178 lb 3.2 oz (80.8 kg)  Height: 5' 3.5" (1.613 m)   Physical Exam  Constitutional:  Obese. appears well-developed and well-nourished. No distress.  HENT:  Head: Normocephalic and atraumatic.  Nose: Nose normal.  Cardiovascular: Normal rate, regular rhythm and normal heart sounds.  Exam reveals no gallop and no friction rub.   No murmur heard. Pulmonary/Chest: Effort normal and breath sounds normal. No respiratory distress.  has no wheezes.no rales.  Abdominal: Soft. Bowel sounds are normal.  exhibits no distension. There is no tenderness. There is no rebound and no guarding.  Neurological: alert and oriented to person, place, and time.  Skin: Skin is warm and dry. No rash noted.  not diaphoretic. No erythema. No pallor.    Assessment & Plan:   See Encounters Tab for problem based charting.  Patient discussed with Dr. Lynnae January

## 2017-01-23 NOTE — Patient Instructions (Signed)
Start taking glipizide ER 10mg  daily.

## 2017-01-24 NOTE — Assessment & Plan Note (Signed)
[name] wore the CGM for 8 days. The average reading was 196, % time in target was 39, % time below target was 0, and % time above target was. 61. Intervention will be to increase glipizide to 10mg . The patient will be scheduled to see [name] and physician for a final appointment.   Pt has not been taking Actos 15mg  nor jardiance 10mg . She was suppose to be taking glipizide 10mg  but has only been taking 5mg . She is also on 70/30 36 units BID and linaglipitin 5mg . Will increase glipizide and have her return in 1 week for further adjustments in insulin.

## 2017-01-26 DIAGNOSIS — E119 Type 2 diabetes mellitus without complications: Secondary | ICD-10-CM | POA: Diagnosis not present

## 2017-01-26 NOTE — Progress Notes (Signed)
Internal Medicine Clinic Attending  Case discussed with Dr. Hulen Luster at the time of the visit.  We reviewed the resident's history and exam and pertinent patient test results.  I agree with the assessment, diagnosis, and plan of care documented in the resident's note. I personally reviewed the data  / CGM download & the resident's interpretation and agree with Dr Hulen Luster plan. There is sig variability in hyperglycemia timing from day to day and normal glucose levels during day limiting pharmaceutical intervention. Will need lifestyle intervention for consistency.

## 2017-01-29 ENCOUNTER — Ambulatory Visit: Payer: Medicare Other | Admitting: Dietician

## 2017-01-29 ENCOUNTER — Encounter: Payer: Self-pay | Admitting: Physician Assistant

## 2017-01-29 ENCOUNTER — Ambulatory Visit (INDEPENDENT_AMBULATORY_CARE_PROVIDER_SITE_OTHER): Payer: Medicare Other | Admitting: Dietician

## 2017-01-29 ENCOUNTER — Ambulatory Visit (INDEPENDENT_AMBULATORY_CARE_PROVIDER_SITE_OTHER): Payer: Medicare Other | Admitting: Physician Assistant

## 2017-01-29 VITALS — BP 150/60 | HR 72 | Ht 62.75 in | Wt 180.5 lb

## 2017-01-29 DIAGNOSIS — N183 Chronic kidney disease, stage 3 unspecified: Secondary | ICD-10-CM

## 2017-01-29 DIAGNOSIS — Z794 Long term (current) use of insulin: Secondary | ICD-10-CM

## 2017-01-29 DIAGNOSIS — R195 Other fecal abnormalities: Secondary | ICD-10-CM | POA: Diagnosis not present

## 2017-01-29 DIAGNOSIS — R1310 Dysphagia, unspecified: Secondary | ICD-10-CM | POA: Diagnosis not present

## 2017-01-29 DIAGNOSIS — E1122 Type 2 diabetes mellitus with diabetic chronic kidney disease: Secondary | ICD-10-CM

## 2017-01-29 DIAGNOSIS — Z7901 Long term (current) use of anticoagulants: Secondary | ICD-10-CM | POA: Diagnosis not present

## 2017-01-29 DIAGNOSIS — Z713 Dietary counseling and surveillance: Secondary | ICD-10-CM | POA: Diagnosis not present

## 2017-01-29 DIAGNOSIS — E1165 Type 2 diabetes mellitus with hyperglycemia: Secondary | ICD-10-CM

## 2017-01-29 MED ORDER — NA SULFATE-K SULFATE-MG SULF 17.5-3.13-1.6 GM/177ML PO SOLN: 1.0000 | Freq: Once | ORAL | 0 refills | Status: AC

## 2017-01-29 NOTE — Patient Instructions (Addendum)
PLEASE see a dentist as soon as possible in case you have infection that is increasing your blood sugars.   Be sure to take your evening insulin ~10-20 minutes before your largest evening meal between 5 PM and 7 PM.   Bran flakes are good!   Can carry 1 cup cheerios as snack.   Try to eat more lower fat and higher fiber foods like 1/2 to 1 cup beans, 1-2 vegetables at meals.   Push Fiber, fiber, fiber, try to eat 1-2 cups of veggies and fruit two trimes a day.   Try to take 5 minute walks after meals three times a day.   Fiber one bars. Are good.  If you going to get vitamins you want a multivitamin with minerals for person over the age of 29.  Consider a daily stool softener to help with the constipation. Colace.   See you in April.

## 2017-01-29 NOTE — Patient Instructions (Signed)
You have been scheduled for a Barium Esophogram at [facility] (1st floor of the hospital) on 01/30/2017 at 10:30am. Please arrive 15 minutes prior to your appointment for registration. Make certain not to have anything to eat or drink 3 hours prior to your test. If you need to reschedule for any reason, please contact radiology at [phone] to do so. __________________________________________________________________ A barium swallow is an examination that concentrates on views of the esophagus. This tends to be a double contrast exam (barium and two liquids which, when combined, create a gas to distend the wall of the oesophagus) or single contrast (non-ionic iodine based). The study is usually tailored to your symptoms so a good history is essential. Attention is paid during the study to the form, structure and configuration of the esophagus, looking for functional disorders (such as aspiration, dysphagia, achalasia, motility and reflux) EXAMINATION You may be asked to change into a gown, depending on the type of swallow being performed. A radiologist and radiographer will perform the procedure. The radiologist will advise you of the type of contrast selected for your procedure and direct you during the exam. You will be asked to stand, sit or lie in several different positions and to hold a small amount of fluid in your mouth before being asked to swallow while the imaging is performed .In some instances you may be asked to swallow barium coated marshmallows to assess the motility of a solid food bolus. The exam can be recorded as a digital or video fluoroscopy procedure. POST PROCEDURE It will take 1-2 days for the barium to pass through your system. To facilitate this, it is important, unless otherwise directed, to increase your fluids for the next 24-48hrs and to resume your normal diet.  This test typically takes about 30 minutes to  perform. __________________________________________________________________  Dennis Bast have been scheduled for a colonoscopy. Please follow written instructions given to you at your visit today.  Please pick up your prep supplies at the pharmacy within the next 1-3 days. If you use inhalers (even only as needed), please bring them with you on the day of your procedure. Your physician has requested that you go to www.startemmi.com and enter the access code given to you at your visit today. This web site gives a general overview about your procedure. However, you should still follow specific instructions given to you by our office regarding your preparation for the procedure.   Stay on all your blood thinners per Nicoletta Ba

## 2017-01-29 NOTE — Progress Notes (Signed)
Agree with assessment and initial plans in this high-risk patient

## 2017-01-29 NOTE — Progress Notes (Signed)
Subjective:    Patient ID: [name], female    DOB: Oct 12, 1951, 66 y.o.   MRN: [id number]  HPI [name] is a pleasant 66 year old African-American female known to Dr. Daphane Shepherd from evaluation in November 2014. She has history of hypertension, peripheral vascular disease, GERD, adult-onset diabetes mellitus, chronic kidney disease stage III and has had CVA 2. She is maintained on Plavix, aspirin and Pletal. When she was seen in 2014 she was here for colon screening. We were unable to get virtual colonoscopy or colon guard approved and eventually decision was made by her cardiologist (Dr. Einar Gip) that she could hold Plavix for 5 days however patient canceled the procedure. She is referred back today with 1 out of 3 Hemoccults positive. She denies any problems with abdominal pain or constipation. She has not seen any blood herself. Family history is negative for colon cancer. She does have complaints of hoarseness over the past several months which she says comes and goes so has developed dysphagia to pills. She says she feels as if her pills are "getting hung up in her throat" and she's off and having to eat something to get pills to go on down. She denies any dysphagia to liquids or solids. She has not had any regular heartburn or indigestion.  Colonoscopy 2004 per Dr. Penelope Coop was normal and EGD in 2004 showed mild gastritis, no stricture  She states that she does not want to come off of blood thinners unless absolutely necessary.  Review of Systems Pertinent positive and negative review of systems were noted in the above HPI section.  All other review of systems was otherwise negative.  Outpatient Encounter Prescriptions as of 01/29/2017  Medication Sig  . aspirin EC 81 MG tablet Take 81 mg by mouth daily.  . BD INSULIN SYRINGE ULTRAFINE 31G X 15/64" 0.5 ML MISC USE TO INJECT INSULIN TWO TIMES A DAY  . cilostazol (PLETAL) 100 MG tablet TAKE 1 TABLET BY MOUTH TWICE A DAY  . clopidogrel (PLAVIX)  75 MG tablet TAKE 1 TABLET BY MOUTH EVERY MORNING  . gemfibrozil (LOPID) 600 MG tablet TAKE 1 TABLET BY MOUTH TWICE A DAY  . glipiZIDE (GLUCOTROL XL) 10 MG 24 hr tablet Take 1 tablet (10 mg total) by mouth daily with breakfast.  . glucose blood (ACCU-CHEK AVIVA PLUS) test strip Use to check blood sugars twice a day.Dx Code: E11.22.  . hydrochlorothiazide (HYDRODIURIL) 25 MG tablet Take 1 tablet (25 mg total) by mouth daily.  . insulin aspart protamine- aspart (NOVOLOG MIX 70/30) (70-30) 100 UNIT/ML injection Inject 0.36 mLs (36 Units total) into the skin 2 (two) times daily with a meal.  . levothyroxine (SYNTHROID) 88 MCG tablet Take 1 tablet (88 mcg total) by mouth daily before breakfast.  . losartan (COZAAR) 100 MG tablet Take 1 tablet (100 mg total) by mouth daily.  . ranitidine (ZANTAC) 150 MG tablet TAKE 1 TABLET BY MOUTH EVERY DAY AS NEEDED  . TRADJENTA 5 MG TABS tablet TAKE 1 TABLET BY MOUTH EVERY DAY  . Na Sulfate-K Sulfate-Mg Sulf 17.5-3.13-1.6 GM/180ML SOLN Take 1 kit by mouth once.   No facility-administered encounter medications on file as of 01/29/2017.    Allergies  Allergen Reactions  . Insulins Rash    Humalog 75/25 only   Patient Active Problem List   Diagnosis Date Noted  . Positive fecal occult blood test 01/18/2017  . Dysphagia 01/16/2017  . GERD (gastroesophageal reflux disease) 06/01/2015  . Normocytic anemia 06/01/2015  . Healthcare  maintenance 06/01/2015  . Diastolic dysfunction 25/91/0289  . Vitamin D deficiency 06/01/2015  . CKD (chronic kidney disease), stage III 03/01/2014  . PVD (peripheral vascular disease) ([place]) 06/18/2013  . Hypothyroidism 01/10/2007  . Hyperlipidemia 01/10/2007  . History of tobacco use 01/10/2007  . HYPERTENSION, BENIGN SYSTEMIC 01/10/2007  . History of CVA (cerebrovascular accident) 01/10/2007  . Diabetes mellitus with stage 3 chronic kidney disease ([place]) 01/10/1989   Social History   Social History  . Marital status: Single      Spouse name: N/A  . Number of children: 1  . Years of education: 10th- GED   Occupational History  . Retired    Social History Main Topics  . Smoking status: Former Smoker    Quit date: 02/04/2011  . Smokeless tobacco: Never Used  . Alcohol use No  . Drug use: No  . Sexual activity: Not on file   Other Topics Concern  . Not on file   Social History Narrative  . No narrative on file    [name]'s family history includes Diabetes in her brother, father, mother, and sister; Prostate cancer in her brother.      Objective:    Vitals:   01/29/17 1048  BP: (!) 150/60  Pulse: 72    Physical Exam  well-developed African-American female in no acute distress, pleasant blood pressure 150/60 pulse 72, height 5 foot 2, weight 180, BMI 32.2. HEENT ;nontraumatic normocephalic EOMI PERRLA sclera anicteric, Cardiovascular; regular rate and rhythm with S1-S2 no murmur rub or gallop, Pulmonary; clear bilaterally, Abdomen; obese soft nontender nondistended bowel sounds are active there is no palpable mass or hepatosplenomegaly, Rectal ;exam not done, Ext; no clubbing cyanosis or edema skin warm and dry, Neuropsych ;mood and affect appropriate       Assessment & Plan:   #63 66 year old African-American female referred for colon cancer screening with positive Hemoccult 1 of 3 #2 pill dysphagia times several months-rule out esophageal stricture #3 chronic antiplatelet therapy-patient on aspirin and Plavix and Pletal #4 history of CVA 2 #5 peripheral vascular disease #6 chronic kidney disease stage III #7 adult-onset diabetes mellitus insulin-dependent #8 hypertension  Plan; Will schedule for barium swallow with tablet Schedule for colonoscopy with Dr. Henrene Pastor. Procedure discussed in detail with the patient including risks and benefits and she is agreeable to proceed. Colonoscopy will be done diagnostic only, and patient will remain on aspirin, Plavix and Pletal. She understands that if  polyps are found she may require a second procedure at least off Plavix, to allow polypectomies.  Amy S Esterwood PA-C 01/29/2017   Cc: [name], MD

## 2017-01-30 ENCOUNTER — Ambulatory Visit (HOSPITAL_COMMUNITY)
Admission: RE | Admit: 2017-01-30 | Discharge: 2017-01-30 | Disposition: A | Discharge status: Home / Self Care | Payer: Medicare Other | Source: Ambulatory Visit | Attending: Physician Assistant | Admitting: Physician Assistant

## 2017-01-30 ENCOUNTER — Encounter: Payer: Self-pay | Admitting: Dietician

## 2017-01-30 DIAGNOSIS — R195 Other fecal abnormalities: Secondary | ICD-10-CM | POA: Diagnosis not present

## 2017-01-30 DIAGNOSIS — M2578 Osteophyte, vertebrae: Secondary | ICD-10-CM | POA: Insufficient documentation

## 2017-01-30 DIAGNOSIS — K219 Gastro-esophageal reflux disease without esophagitis: Secondary | ICD-10-CM | POA: Insufficient documentation

## 2017-01-30 DIAGNOSIS — R1310 Dysphagia, unspecified: Secondary | ICD-10-CM

## 2017-01-30 NOTE — Progress Notes (Signed)
  Medical Nutrition Therapy:  Appt start time: 1694 end time:  1405. Visit #6 last visit was last week  Assessment:  Primary concerns today: glycemic control and improve constipation [name] has been wearing the Freestyle libre Pro CGM for the past 2 weeks and keeping food records. Her glipizide dose was increased as her intervention last week. Her activity is very little other than ADLs because of the cold and wet weather and her painful PAD. Her constipation is still bothersome. Her blood sugars are significantly higher this week.    CGM download showed higher blood sugars:for past 2 weeks eAG 229, eA1C 9.6%, time in target 24%, time above target 76%. No low blood sugars over the past week.  High variabl  Food intake: For the past week she ate more fiber and more meals and higher fat food choices. Unsure if increased glipizide increased hunger vs. She was trying to increase her fiber intake and inadvertently increased her overall intake.   Weight is stable ~ not done today- goal for her is lower to ~ 170#.  Medications: Takes insulin 36 units twice daily  12 hours apart at 730 and and 730 PM,Her Pm dose is not consistently before her Pm meal because this meal varies and she tried to keep her insulin dose the same time. For the past week has been taking 10 mg glipizide XL.   Recommend moderate carb, healthy lower fat, high fiber protein diet for weight loss to help with insulin resistance 1400-1500 calories 165-170 g carbohydrates 85-95 g protein 50 g fat  Progress Towards Goal(s):  No progress.   Nutritional Diagnosis:  NB-1.1 Food and nutrition-related knowledge deficit As related to lack of prior adequate diabetes meal planning is improving  As evidenced by her report, however her food choices over the past week have not shown readiness to change enough to control blood sugars.    Intervention:  Nutrition education about how her food and medicine affected her blood sugar over the past  week. Education about rational and foods to include to increase her fiber and decrease saturated fats and total fat in food to help lower blood sugar. Offered additional meal planning assistance such as meal planing for a week, shopping lists, tips for healthier lower cost shopping.  Coordination of care- suggest Vitamin supplementation as her diet is very low in sources of vitamin D and she does not go outdoors often.  Suggest adding victoza to insulin with slightly lower doses to prevent hypoglycemia. Patient agrees to trail of victoza.   Teaching Method Utilized: Visual, Auditory,Hands on Handouts given during visit include: AVS, What to eat heandout Barriers to learning/adherence to lifestyle change: very limited finances Demonstrated degree of understanding via:  Teach Back   Monitoring/Evaluation:  Dietary intake, exercise, meter, and body weight .-4 weeks [name], [name], RD 01/30/2017 9:35 AM.

## 2017-01-31 ENCOUNTER — Other Ambulatory Visit: Payer: Self-pay

## 2017-01-31 DIAGNOSIS — R1314 Dysphagia, pharyngoesophageal phase: Secondary | ICD-10-CM

## 2017-01-31 DIAGNOSIS — R933 Abnormal findings on diagnostic imaging of other parts of digestive tract: Secondary | ICD-10-CM

## 2017-02-02 ENCOUNTER — Telehealth: Payer: Self-pay | Admitting: Dietician

## 2017-02-02 NOTE — Telephone Encounter (Signed)
Called [name] to see if she knew why she wrote in her log that she ate more after the glipizide was increased to 10 mg per day during the second week she wore the CGM than the first week (she also had higher blood sugars) She said "because I was hungry". I am still hungry and my blood sugars are still high. She says she went back to 5 mg of glipizide XL per day.  I asked if she wanted to come in to the office sooner to be evaluated and she said no she'd wait until 02/27/17.

## 2017-02-05 ENCOUNTER — Other Ambulatory Visit: Payer: Self-pay

## 2017-02-05 ENCOUNTER — Other Ambulatory Visit: Payer: Self-pay | Admitting: *Deleted

## 2017-02-05 DIAGNOSIS — I712 Thoracic aortic aneurysm, without rupture, unspecified: Secondary | ICD-10-CM

## 2017-02-05 DIAGNOSIS — R1319 Other dysphagia: Secondary | ICD-10-CM

## 2017-02-05 NOTE — Patient Outreach (Signed)
[facility]) Care Management   02/05/2017  [name] 1951-04-27 [id number]  [name] is an 66 y.o. female  Subjective:   Member denies pain or discomfort at this time.  She report that she has been compliant with all medications as well as keeping a food journal and glucose logs.  She state she has been working with dietician D. Plyler for a detailed care plan for diabetes management.  Unfortunately, according to glucose logs, her blood sugars have remained greater than 200 consistently.    Objective:   Review of Systems  Constitutional: Negative.   HENT: Negative.   Eyes: Negative.   Respiratory: Negative.   Cardiovascular: Negative.   Gastrointestinal: Negative.   Genitourinary: Negative.   Musculoskeletal: Negative.   Skin: Negative.   Neurological: Negative.   Endo/Heme/Allergies: Negative.   Psychiatric/Behavioral: Negative.     Physical Exam  Constitutional: She is oriented to person, place, and time. She appears well-developed and well-nourished.  Neck: Normal range of motion.  Cardiovascular: Normal rate, regular rhythm and normal heart sounds.   Respiratory: Effort normal and breath sounds normal.  GI: Soft. Bowel sounds are normal.  Musculoskeletal: Normal range of motion.  Neurological: She is alert and oriented to person, place, and time.  Skin: Skin is warm and dry.   BP 126/68 (BP Location: Left Arm, Patient Position: Sitting, Cuff Size: Normal)   Pulse 68   Resp 18   SpO2 97%   Encounter Medications:   Outpatient Encounter Prescriptions as of 02/05/2017  Medication Sig Note  . aspirin EC 81 MG tablet Take 81 mg by mouth daily.   . BD INSULIN SYRINGE ULTRAFINE 31G X 15/64" 0.5 ML MISC USE TO INJECT INSULIN TWO TIMES A DAY   . cilostazol (PLETAL) 100 MG tablet TAKE 1 TABLET BY MOUTH TWICE A DAY 01/08/2017: Taking PRN  . clopidogrel (PLAVIX) 75 MG tablet TAKE 1 TABLET BY MOUTH EVERY MORNING   . gemfibrozil (LOPID) 600 MG tablet  TAKE 1 TABLET BY MOUTH TWICE A DAY   . glipiZIDE (GLUCOTROL XL) 10 MG 24 hr tablet Take 1 tablet (10 mg total) by mouth daily with breakfast. 02/05/2017: Taking 5 mg daily  . glucose blood (ACCU-CHEK AVIVA PLUS) test strip Use to check blood sugars twice a day.Dx Code: E11.22.   . hydrochlorothiazide (HYDRODIURIL) 25 MG tablet Take 1 tablet (25 mg total) by mouth daily.   . insulin aspart protamine- aspart (NOVOLOG MIX 70/30) (70-30) 100 UNIT/ML injection Inject 0.36 mLs (36 Units total) into the skin 2 (two) times daily with a meal.   . levothyroxine (SYNTHROID) 88 MCG tablet Take 1 tablet (88 mcg total) by mouth daily before breakfast.   . losartan (COZAAR) 100 MG tablet Take 1 tablet (100 mg total) by mouth daily.   . ranitidine (ZANTAC) 150 MG tablet TAKE 1 TABLET BY MOUTH EVERY DAY AS NEEDED   . TRADJENTA 5 MG TABS tablet TAKE 1 TABLET BY MOUTH EVERY DAY    No facility-administered encounter medications on file as of 02/05/2017.     Functional Status:   In your present state of health, do you have any difficulty performing the following activities: 08/29/2016 08/22/2016  Hearing? N N  Vision? N N  Difficulty concentrating or making decisions? N N  Walking or climbing stairs? N N  Dressing or bathing? N N  Doing errands, shopping? N N  Preparing Food and eating ? N N  Using the Toilet? N N  In  the past six months, have you accidently leaked urine? Y Y  Do you have problems with loss of bowel control? N N  Managing your Medications? N N  Managing your Finances? N N  Housekeeping or managing your Housekeeping? N N  Some recent data might be hidden    Fall/Depression Screening:    PHQ 2/9 Scores 08/29/2016 08/10/2016 05/01/2016 10/29/2015 09/17/2015 07/01/2015 06/01/2015  PHQ - 2 Score 0 0 0 0 0 0 0    Assessment:    Met with member at scheduled time.  D. Plyler's notes reviewed, reiterated to member the importance of medication management and diet. She state that her blood sugar  today was 261, yesterday was 124.  This care manager inquired about the daily management of her diet and medications over the Saturday versus Sunday.  She state that she "starved" hersel on Saturday, resulting in a lower blood sugar Sunday am (124).  However on yesterday, she report eating more and drinking regular Sprite, resulting in a higher blood sugar today.  She is aware of the food/drinks she should not have, state she will "do better."    She report that she was told that her current glucose meter may be old, has prescription for a new one, state she will obtain this week.  She denies any questions, advised to contact with concerns.  Plan:   Will follow up next week regarding new glucose meter.  Will assess need for home visit at that time.  THN CM Care Plan Problem One     Most Recent Value  Care Plan Problem One  Self-Care Deficit related to diabetes management as evidenced by elevated A1C  Role Documenting the Problem One  Care Management [place] for Problem One  Active  THN Long Term Goal (31-90 days)  Member's A1C will decrease from 9.7 to </= 8.5 over the next 90 days  THN Long Term Goal Start Date  02/05/17  [facility] Long Term Goal Met Date  02/05/17  Interventions for Problem One Long Term Goal  Educated member on ways to decrease A1C, including diet, exercise, daily blood sugar checks with recording for a visual trend.  THN CM Short Term Goal #1 (0-30 days)  Member will keep and attend follow up with nutritionist within the next 4 weeks  THN CM Short Term Goal #1 Start Date  02/05/17  [facility] CM Short Term Goal #1 Met Date  -- [Goal not met]  Interventions for Short Term Goal #1  Discussed with member the importance of following D. Plyler's plan of care and importance of keeping appointment in effort to effectively manage diabetes   THN CM Short Term Goal #2 (0-30 days)  Member will have new glucose meter within the next 2 weeks  THN CM Short Term Goal #2 Start Date   02/05/17  [facility] CM Short Term Goal #2 Met Date  01/08/17  Interventions for Short Term Goal #2  Received prescription for new glucose meter.  Advised to obtain as soon as possible in order to have readings from new meter for next office visit.     Valente David, [place], MSN [place] [phone]

## 2017-02-06 ENCOUNTER — Ambulatory Visit (HOSPITAL_COMMUNITY)
Admission: RE | Admit: 2017-02-06 | Discharge: 2017-02-06 | Disposition: A | Discharge status: Home / Self Care | Payer: Medicare Other | Source: Ambulatory Visit | Attending: Physician Assistant | Admitting: Physician Assistant

## 2017-02-06 DIAGNOSIS — R1319 Other dysphagia: Secondary | ICD-10-CM | POA: Diagnosis not present

## 2017-02-06 DIAGNOSIS — I700 Atherosclerosis of aorta: Secondary | ICD-10-CM | POA: Insufficient documentation

## 2017-02-06 DIAGNOSIS — I712 Thoracic aortic aneurysm, without rupture, unspecified: Secondary | ICD-10-CM

## 2017-02-06 DIAGNOSIS — T189XXA Foreign body of alimentary tract, part unspecified, initial encounter: Secondary | ICD-10-CM | POA: Diagnosis not present

## 2017-02-10 ENCOUNTER — Other Ambulatory Visit: Payer: Self-pay | Admitting: Internal Medicine

## 2017-02-10 DIAGNOSIS — N183 Chronic kidney disease, stage 3 unspecified: Secondary | ICD-10-CM

## 2017-02-10 DIAGNOSIS — E1122 Type 2 diabetes mellitus with diabetic chronic kidney disease: Secondary | ICD-10-CM

## 2017-02-12 ENCOUNTER — Other Ambulatory Visit: Payer: Self-pay

## 2017-02-12 MED ORDER — GLUCOSE BLOOD VI STRP: ORAL_STRIP | 4 refills | Status: DC

## 2017-02-12 NOTE — Telephone Encounter (Signed)
Medication no longer on pt's list.  Will send to pcp for review and refill if appropriate.  Please advise.Regenia Skeeter, Acire Tang Cassady4/2/201811:31 AM

## 2017-02-13 NOTE — Telephone Encounter (Signed)
Pharmacy requesting new Rx for Accu-Check guide Monitor System, strips, and lancets please include ICD 10 and numbers of times to test blood sugars

## 2017-02-13 NOTE — Telephone Encounter (Signed)
Called patient to find out what [name] is requesting. She says I encouraged her to get e new meter because hers is old. She says hers works fine, she has plenty of strips and lancets and doe snot want to change to a new meter. I told her she should be fine to continue using an accu chek aviva meter.will call her pharmacy and inform them.   She says she stopped taking the glipizide because it was making  Her dizzy and she was getting a headache. She also says she is not taking the lopid because it made her feel bad.  She says she is taking her PM insulin before her PM meal but her blood sugar is still high and she is worried about her kidneys and that her high blood sugar may damage them.  I suggested she think about trying a vegetarian meal plan with little fat and lot's of whole grains, beans and vegetables and some fruit.  She agreed to discuss this further at our next visit.

## 2017-02-16 ENCOUNTER — Other Ambulatory Visit: Payer: Self-pay | Admitting: *Deleted

## 2017-02-16 NOTE — Patient Outreach (Signed)
[facility]) Care Management  02/16/2017  KIMISHA EUNICE 1951/01/14 [id number]   Call placed to member to follow up on new glucose meter.  She has not received the new meter, stating that she does not think she need it and will continue to use her old one.  She denies concerns at this time, home visit scheduled for later this month.  Valente David, [place], MSN [place] [phone]

## 2017-02-27 ENCOUNTER — Ambulatory Visit: Payer: Medicare Other | Admitting: Dietician

## 2017-02-27 ENCOUNTER — Ambulatory Visit: Payer: Medicare Other

## 2017-03-05 ENCOUNTER — Telehealth: Payer: Self-pay | Admitting: Internal Medicine

## 2017-03-05 ENCOUNTER — Other Ambulatory Visit: Payer: Self-pay | Admitting: *Deleted

## 2017-03-05 DIAGNOSIS — E1122 Type 2 diabetes mellitus with diabetic chronic kidney disease: Secondary | ICD-10-CM

## 2017-03-05 DIAGNOSIS — N183 Chronic kidney disease, stage 3 unspecified: Secondary | ICD-10-CM
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
## 2017-03-07 ENCOUNTER — Other Ambulatory Visit: Payer: Self-pay

## 2017-03-07 MED ORDER — INSULIN ASPART PROT & ASPART (70-30 MIX) 100 UNIT/ML ~~LOC~~ SUSP: 36.0000 [IU] | Freq: Two times a day (BID) | SUBCUTANEOUS | 3 refills | Status: DC

## 2017-03-07 MED ORDER — GLIPIZIDE ER 10 MG PO TB24: 10.0000 mg | ORAL_TABLET | Freq: Every day | ORAL | 1 refills | Status: DC

## 2017-03-07 NOTE — Telephone Encounter (Signed)
Does she need a refill of glipizide and her insulin? Please clarify, Thank you!

## 2017-03-07 NOTE — Telephone Encounter (Signed)
Pharmacy requesting 90 day supply glipizide

## 2017-03-08 ENCOUNTER — Encounter: Payer: Medicare Other | Admitting: Internal Medicine

## 2017-03-12 ENCOUNTER — Other Ambulatory Visit: Payer: Self-pay | Admitting: Pharmacist

## 2017-03-12 NOTE — Patient Outreach (Signed)
Call and speak with Antony Haste at [facility]'s [facility]. Antony Haste states that the Novolog 70/30 is no longer covered on the patient's plan and the Humalog Mix 75-25 is the covered alternative. Note that per the formulary for the patient's plan on the [employer], Humalog Mix 75-25 is the closest covered intermediate acting + rapid acting combination insulin.  Antony Haste reports that the pharmacy has faxed the PCP's office a couple of times, but has not yet received a response about this change.   Will call to follow up with patient's PCP office now.  Harlow Asa, PharmD, [place] Management [phone]

## 2017-03-12 NOTE — Patient Outreach (Signed)
[name] is referred to pharmacy for medication assistance. Per referral, patient reports that her insurance company will no longer pay for Novolog 70/30 insulin. Called and spoke with patient. HIPAA identifiers verified and verbal consent received.  [name] reports that she has been notified by her pharmacy and her insurance company that her Novolog 70/30 insulin will no longer be covered. Reports that her pharmacy, [place] [phone]) has contacted her doctor's office about this, but that she has not yet heard back about the medication being changed.   Let [name] know that I will call to follow up with her pharmacy and/or PCP office now and then call her back.  Harlow Asa, PharmD, [place] Management [phone]

## 2017-03-12 NOTE — Patient Outreach (Signed)
Call and speak with Charleston Ropes in patient's PCP office. Let Charleston Ropes know that Novolog 70/30 is no longer covered on the patient's plan and that per the formulary for the patient's plan on the [employer], Humalog Mix 75-25 is the closest covered intermediate acting + rapid acting combination insulin.  Charleston Ropes states that she will forward this message to patient's PCP now to request that the insulin be changed for the patient. I will call now to update Ms. Corkins.  Harlow Asa, PharmD, [place] Management [phone]

## 2017-03-12 NOTE — Patient Outreach (Signed)
Follow up call with Ms. Toney.  HIPAA identifiers verified and verbal consent received.  Patient reports that she is allergic to the Humalog insulins, that it has caused her to "break out all over" in the past. Reports that in the past she has received the medication by her providers completing paperwork with her insurance company to allow for the approval. Note that per the patient's PCP, a prior authorization has been completed and approved by the insurance company. However, per Antony Haste at [employer], the pharmacy processed the Novolog prescription again today and the insurance still rejects this claim as "product not covered". Ms. Talamo reports that she will bring over the letter that she received from [place] to the Internal Medicine clinic today. Advise patient to bring this to Dr. Mannie Stabile in the clinic.  Coordination of care call to patient's PCP and to Clinical Pharmacist at the Internal Medicine Clinic, Mannie Stabile. Dr. Maudie Mercury reports that she will call to follow up with the patient directly. Let Dr. Maudie Mercury know that Ms. Bechler is also bringing over this form that she received from [employer] about the process of getting her Novolog approved.   Let Ms. Harmon know that I will also call to follow up with her on 03/14/17.   Harlow Asa, PharmD, [place] Management [phone]

## 2017-03-12 NOTE — Telephone Encounter (Signed)
Benjamine Mola [facility] pharmacist) states Novolog 70/30 that was ordered 03/07/17 no longer covered by insurance. Asking to switch to Humalog 75/25 w/c is covered.  Sending to pcp for review.

## 2017-03-12 NOTE — Telephone Encounter (Signed)
Prior approval from insurance was obtained on 01/17/17 through 11/12/17 for novolog 70/30 mix per our records. Refer to note from [place] on 01/17/17. If further action needs to be taken, please let us know.  [name], MD IMTS - PGY1 Pager [phone]

## 2017-03-14 ENCOUNTER — Other Ambulatory Visit: Payer: Self-pay | Admitting: Pharmacist

## 2017-03-14 NOTE — Patient Outreach (Signed)
Receive a call back from Ms. Barnfield. HIPAA identifiers verified and verbal consent received.  Ms. Arrazola reports that she just met with Dr. Maudie Mercury at the Internal Medicine Clinic and that she gave Dr. Maudie Mercury the form from [place] to submit the request to keep her on the Novolog 70/30 insulin. Reports that Dr. Maudie Mercury said that she would complete this for the patient and that she also gave Ms. Schindel samples of the Novolog 70/30 to last until she is able to get this from the insurance.  Ms. Throgmorton expresses appreciation for my help. Denies any further medication questions/concerns at this time. Reports that she will call me with any future questions.  Will close pharmacy episode at this time.  Harlow Asa, PharmD, [place] Management [phone]

## 2017-03-14 NOTE — Patient Outreach (Signed)
Outreach to follow up with [name] about her discussion with her PCP office regarding the process of getting her Novolog approved by [employer]. Called and spoke with patient. HIPAA identifiers verified and verbal consent received.  [name] reports that she is currently in the waiting room at the Internal Medicine Clinic waiting to be seen about her Novolog. Patient reports that she will call me back if she has any questions today. Let Ms. Belay know that if I don't hear back from her today, I will call to follow up with her on 03/16/17.  Harlow Asa, PharmD, [place] Management [phone]

## 2017-03-16 ENCOUNTER — Ambulatory Visit: Payer: Medicare Other | Admitting: Pharmacist

## 2017-03-16 ENCOUNTER — Telehealth: Payer: Self-pay | Admitting: Internal Medicine

## 2017-03-16 NOTE — Telephone Encounter (Signed)
Please call ALye back @ [phone] about a prior authorization for the patient's medication

## 2017-03-19 ENCOUNTER — Telehealth: Payer: Self-pay | Admitting: Internal Medicine

## 2017-03-20 ENCOUNTER — Encounter: Payer: Self-pay | Admitting: Internal Medicine

## 2017-03-20 ENCOUNTER — Ambulatory Visit (INDEPENDENT_AMBULATORY_CARE_PROVIDER_SITE_OTHER): Payer: Medicare Other | Admitting: Internal Medicine

## 2017-03-20 ENCOUNTER — Encounter (INDEPENDENT_AMBULATORY_CARE_PROVIDER_SITE_OTHER): Payer: Self-pay

## 2017-03-20 ENCOUNTER — Ambulatory Visit (INDEPENDENT_AMBULATORY_CARE_PROVIDER_SITE_OTHER): Payer: Medicare Other | Admitting: Dietician

## 2017-03-20 VITALS — BP 152/61 | HR 70 | Temp 97.6°F | Ht 62.9 in | Wt 179.4 lb

## 2017-03-20 DIAGNOSIS — N183 Chronic kidney disease, stage 3 unspecified: Secondary | ICD-10-CM

## 2017-03-20 DIAGNOSIS — E11621 Type 2 diabetes mellitus with foot ulcer: Secondary | ICD-10-CM | POA: Diagnosis not present

## 2017-03-20 DIAGNOSIS — L97521 Non-pressure chronic ulcer of other part of left foot limited to breakdown of skin: Secondary | ICD-10-CM | POA: Diagnosis not present

## 2017-03-20 DIAGNOSIS — Z794 Long term (current) use of insulin: Secondary | ICD-10-CM | POA: Diagnosis not present

## 2017-03-20 DIAGNOSIS — E1122 Type 2 diabetes mellitus with diabetic chronic kidney disease: Secondary | ICD-10-CM

## 2017-03-20 DIAGNOSIS — L97509 Non-pressure chronic ulcer of other part of unspecified foot with unspecified severity: Secondary | ICD-10-CM

## 2017-03-20 MED ORDER — INSULIN PEN NEEDLE 31G X 5 MM MISC: 1.0000 | Freq: Two times a day (BID) | 3 refills | Status: DC

## 2017-03-20 MED ORDER — INSULIN ISOPHANE & REGULAR (HUMAN 70-30)100 UNIT/ML KWIKPEN: 36.0000 [IU] | PEN_INJECTOR | Freq: Two times a day (BID) | SUBCUTANEOUS | 11 refills | Status: DC

## 2017-03-20 NOTE — Progress Notes (Signed)
   CC: DM follow up   HPI:  [name] is a 66 y.o. female with past medical history outlined below here for dental pain. For the details of today's visit, please refer to the assessment and plan.  Past Medical History:  Diagnosis Date  . Anemia   . Arteriosclerotic cardiovascular disease   . Claudication in peripheral vascular disease ([place])   . CVA (cerebral vascular accident) ([place]) 01/2011   affected rt side  . Diabetes mellitus   . GERD (gastroesophageal reflux disease)   . Hyperlipidemia   . Hypertension   . Hypocholesterolemia   . Hypothyroidism   . Peripheral vascular disease ([place])   . Renal insufficiency   . Vitamin D deficiency     Review of Systems:  All pertinents listed in HPI, otherwise negative  Physical Exam:  There were no vitals filed for this visit.  Constitutional: NAD, appears comfortable Cardiovascular: RRR, no murmurs, rubs, or gallops.  Pulmonary/Chest: CTAB, no wheezes, rales, or rhonchi.  Extremities: Warm and well perfused. No edema. 1/2 cm foot callus over her left bunion with mild skin breakdown.  Neurological: A&Ox3, CN II - XII grossly intact.   Assessment & Plan:   See Encounters Tab for problem based charting.  Patient discussed with Dr. Eppie Gibson

## 2017-03-20 NOTE — Patient Instructions (Addendum)
Take your insulin with you in the afternoon so you can inject it 15 minutes before supper every night.  To help your body use insulin better- try to walk or or do other exercise for 5 minutes there times a day.   Take skin off fried chicken

## 2017-03-20 NOTE — Patient Instructions (Signed)
Ms. Friesenhahn,  It was a pleasure seeing you today. Please continue to take your tradjenta daily and use 36 units of your 70/30 insulin twice a day. Please continue to take your other medications as previously prescribed. Please follow up in 1 month with your primary care doctor. I have placed a referral to podiatry (the foot doctor), you will be called for an appointment. If you have any questions or concerns, call our clinic at [phone] or after hours call [phone] and ask for the internal medicine resident on call. Thank you!  - Dr. Philipp Ovens

## 2017-03-20 NOTE — Assessment & Plan Note (Signed)
Patient is here today for diabetes follow-up. She is currently taking Novolog 70/30 36 units BID and Tradjenta 5 mg daily. Since her last visit, patient has discontinued her glipizide 5 mg due to dizziness, chest pain, and tinnitus. On review of her glucometer today, she continues to be uncontrolled. Average CBGs are in the 200s with a range of 180-300. She has had no hypoglycemic episodes. Patient has very strong beliefs regarding her medications and is unwilling to increase her dose of insulin. Patient believes that higher doses of her insulin cause symptoms of neuropathy. I attempted to counsel patient on the pathophysiology of diabetic neuropathy and tried to reassure her that insulin is does not cause neuropathy, however she was uninterested in listening to what I had to say. She has been on multiple oral antidiabetic medications in the past all of which she has self discontinued due to vague adverse side effects of generalized pain. She has previously tried lindagliptin, pioglitazone, and empagliflozin. I counseled patient on starting a different agent like metformin or possibly Victoza, however patient refuses today. She is concerned that she will start a new medication and "never be able to come off". Overall, patient has very low health literacy and a very poor understanding of her disease state. Encouraged PCP to continue addressing in the future. -- Continue 70/30 36 unit BID -- Continue Tradjenta 5 mg daily  -- F/u PCP in 1 month for A1C

## 2017-03-20 NOTE — Progress Notes (Signed)
Case discussed with Dr. Philipp Ovens at the time of the visit. We reviewed the resident's history and exam and pertinent patient test results. I agree with the assessment, diagnosis, and plan of care documented in the resident's note.  All we can offer is continuing education in hopes that someday she will find value in the advice before it is too late and her diabetic complications become end stage.

## 2017-03-20 NOTE — Assessment & Plan Note (Signed)
Patient has a mild superficial diabetic foot ulcer over her left bunion. She is requesting podiatry referral.  -- Podiatry referral placed

## 2017-03-22 ENCOUNTER — Other Ambulatory Visit: Payer: Self-pay | Admitting: Pharmacist

## 2017-03-22 ENCOUNTER — Telehealth: Payer: Self-pay | Admitting: Dietician

## 2017-03-22 DIAGNOSIS — I10 Essential (primary) hypertension: Secondary | ICD-10-CM

## 2017-03-22 MED ORDER — HYDROCHLOROTHIAZIDE 25 MG PO TABS: 25.0000 mg | ORAL_TABLET | Freq: Every day | ORAL | 1 refills | Status: DC

## 2017-03-22 NOTE — Progress Notes (Signed)
  Medical Nutrition Therapy:  Appt start time: 2694 end time:  8546. Visit #7   Assessment:  Primary concerns today: glycemic control and improve constipation. [name] reports same medications, high blood sugars. Does not want any other diabetes medicine. Says she will work on lifestyle changes of increased activity and healthier food choices. Would like to eat more greens. Her activity is very little other than ADLs. Her constipation is better- 3 BMs, soft & formed/week, she would like to have BMs more often.    Meter download for past 30 days:average 251, range 172-324, testing 0-1x/day, standard deviation 46.4, 95% above 180 mg/dl, all values between 530 and 11 AM  Food intake: Continues to report high fiber food choices and high fat choices as well. Drinking silk one time a day, beans with no fat added often,bran flakes.drinks water mostly    Weight- 179.4#.Increase 7# since last visit, but back at her baseline  Medications: Takes insulin 36 units twice daily  12 hours apart at 730 and 730 PM, before her AM meal consistently, but before her PM meal varies because she is often not home. She states barriers to taking it with her when she eats out.   Recommend moderate carb, healthy lower fat, high fiber protein diet for weight loss to help with insulin resistance 1400-1500 calories 165-170 g carbohydrates 85-95 g protein 50 g fat  Progress Towards Goal(s):  No progress.   Nutritional Diagnosis:  NB-1.1 Food and nutrition-related knowledge deficit As related to lack of prior adequate diabetes meal planning is improving  As evidenced by her report, however her food choices over the past week have not shown readiness to change enough to control blood sugars.    Intervention:  Nutrition education and counseling about how her food and medicine affected her blood sugar. Education about options to improve glycemic control and constipation.  Coordination of care- discussed care with DR.  Philipp Ovens  Teaching Method Utilized: Visual, Auditory,Hands on Handouts given during visit include: AVS Barriers to learning/adherence to lifestyle change: very limited finances Demonstrated degree of understanding via:  Teach Back   Monitoring/Evaluation:  Dietary intake, exercise, meter, and body weight .-4 weeks Plyler, [name], RD 03/22/2017 9:39 AM.

## 2017-03-22 NOTE — Telephone Encounter (Signed)
Calling Ms. See to follow up from her visit with her goal of improving her blood sugars using lifestyle changes.

## 2017-03-23 ENCOUNTER — Telehealth: Payer: Self-pay | Admitting: *Deleted

## 2017-03-23 NOTE — Telephone Encounter (Signed)
RTC to patient left message that she had coughed up some blood tinged mucous after leaving Clinics.  Stated only did 1 time after coughing past walking a lot.  Stated went home rested and has not occurred anymore.  Said that throat was not sore.  Was advised that if this happens again to call the Clinics or go to the ER.  Patient voiced understanding of the plan and will call if this occurs again.   Dr.  Dareen Piano was informed of and agreed with plan. Sander Nephew, RN 03/23/2017 1:53 PM.

## 2017-04-02 ENCOUNTER — Other Ambulatory Visit: Payer: Self-pay | Admitting: *Deleted

## 2017-04-02 NOTE — Patient Outreach (Signed)
[facility]) Care Management   04/02/2017  KASI LASKY 08-04-1951 [id number]  [name] is an 66 y.o. female  Subjective:   Member alert & oriented x 3, denies pain or discomfort.  She report compliance with medications with the exception of medications for diabetes and Pletal (taking PRN).  She report she is taking Tradjenta, and has continued to take her Novolog 70/30 instead of the Humulin 70/30 pens.  She states she wanted to have discussion with this care manager prior to starting.  Objective:   Review of Systems  Constitutional: Negative.   HENT: Negative.   Eyes: Negative.   Respiratory: Negative.   Cardiovascular: Negative.   Gastrointestinal: Negative.   Genitourinary: Negative.   Musculoskeletal: Negative.   Skin: Negative.   Neurological: Negative.   Endo/Heme/Allergies: Negative.   Psychiatric/Behavioral: Negative.     Physical Exam  Constitutional: She is oriented to person, place, and time. She appears well-developed and well-nourished.  Neck: Normal range of motion.  Cardiovascular: Normal rate, regular rhythm and normal heart sounds.   Respiratory: Effort normal and breath sounds normal.  GI: Soft. Bowel sounds are normal.  Musculoskeletal: Normal range of motion.  Neurological: She is alert and oriented to person, place, and time.  Skin: Skin is warm and dry.   BP 136/62 (BP Location: Right Arm, Patient Position: Sitting, Cuff Size: Normal)   Pulse 75   Resp 18   SpO2 96%   Encounter Medications:   Outpatient Encounter Prescriptions as of 04/02/2017  Medication Sig Note  . aspirin EC 81 MG tablet Take 81 mg by mouth daily.   . BD INSULIN SYRINGE ULTRAFINE 31G X 15/64" 0.5 ML MISC USE TO INJECT INSULIN TWO TIMES A DAY   . cilostazol (PLETAL) 100 MG tablet TAKE 1 TABLET BY MOUTH TWICE A DAY 01/08/2017: Taking PRN  . clopidogrel (PLAVIX) 75 MG tablet TAKE 1 TABLET BY MOUTH EVERY MORNING   . gemfibrozil (LOPID) 600 MG tablet TAKE 1  TABLET BY MOUTH TWICE A DAY   . glucose blood (ACCU-CHEK AVIVA PLUS) test strip Use to check blood sugars twice a day.Dx Code: E11.22.   . hydrochlorothiazide (HYDRODIURIL) 25 MG tablet Take 1 tablet (25 mg total) by mouth daily.   . Insulin Isophane & Regular Human (HUMULIN 70/30 MIX) (70-30) 100 UNIT/ML PEN Inject 36 Units into the skin 2 (two) times daily.   . Insulin Pen Needle (B-D UF III MINI PEN NEEDLES) 31G X 5 MM MISC 1 Dose by Does not apply route 2 (two) times daily. The patient is insulin requiring, ICD 10 code E11.22 .   . levothyroxine (SYNTHROID) 88 MCG tablet Take 1 tablet (88 mcg total) by mouth daily before breakfast.   . losartan (COZAAR) 100 MG tablet Take 1 tablet (100 mg total) by mouth daily.   . ranitidine (ZANTAC) 150 MG tablet TAKE 1 TABLET BY MOUTH EVERY DAY AS NEEDED   . TRADJENTA 5 MG TABS tablet TAKE 1 TABLET BY MOUTH EVERY DAY   . glipiZIDE (GLUCOTROL XL) 10 MG 24 hr tablet Take 1 tablet (10 mg total) by mouth daily with breakfast. (Patient not taking: Reported on 04/02/2017)   . JARDIANCE 10 MG TABS tablet     No facility-administered encounter medications on file as of 04/02/2017.     Functional Status:   In your present state of health, do you have any difficulty performing the following activities: 03/20/2017 08/29/2016  Hearing? N N  Vision? N N  Difficulty concentrating or making decisions? N N  Walking or climbing stairs? N N  Dressing or bathing? Y N  Doing errands, shopping? N N  Preparing Food and eating ? - N  Using the Toilet? - N  In the past six months, have you accidently leaked urine? - Y  Do you have problems with loss of bowel control? - N  Managing your Medications? - N  Managing your Finances? - N  Housekeeping or managing your Housekeeping? - N  Some recent data might be hidden    Fall/Depression Screening:    Fall Risk  03/20/2017 08/29/2016 08/10/2016  Falls in the past year? No No No   PHQ 2/9 Scores 03/20/2017 08/29/2016 08/10/2016  05/01/2016 10/29/2015 09/17/2015 07/01/2015  PHQ - 2 Score 0 0 0 0 0 0 0    Assessment:    Met with member at scheduled time.  She immediately begins to discuss frustration regarding management of diabetes.  This care manager asked the member to elaborate on concerns.  Initially she stated that she did not want to start something new and not be able to go back to her original regime.  She state that she has tried several agents and does not feel anything has worked.  She is educated on the process of establishing an accurate regime for her, trying new agents if others don't work.  She state that she feels as if the Humulin, or any new agent, will cause her to have complications, such as damage to her eyes, kidneys, and neuropathy.  She is educated about uncontrolled diabetes will cause complications, advised to that this is an effort to better control her blood sugars to decrease risk of complications.  She then becomes very tearful, stating that she does not like needles and does not want to take any insulin at all.  Educated on the complications (including death) if she does not take any insulin and her blood sugars remain out of control.  She verbalizes understanding and state it's a risk she is wiling to take.    This care manager discussed complications and treatment again, she agrees to take Humulin over the next few weeks and follow up with dietician and primary MD next month.  She state "I'm only doing this and taking my blood sugar because of you."  She is advised to discuss concerns regarding not wanting to take any insulin at all during next MD visit.  She verbalizes understanding.  Blood sugar over the last month range 157-358.    She denies any further concerns at this time.  Advised to contact with questions, advised to also continue daily monitoring of blood sugar, preferably twice a day if possible.  She state she will continue once daily.    Plan:   Will follow up next month for routine  home visit.  THN CM Care Plan Problem One     Most Recent Value  Care Plan Problem One  Self-Care Deficit related to diabetes management as evidenced by elevated A1C  Role Documenting the Problem One  Care Management [place] for Problem One  Active  THN Long Term Goal (31-90 days)  Member's A1C will decrease from 9.7 to </= 8.5 over the next 90 days  THN Long Term Goal Start Date  02/05/17  Interventions for Problem One Long Term Goal  Educated member on ways to decrease A1C, including diet, exercise, daily blood sugar checks with recording for a visual trend.  THN CM Short  Term Goal #1 (0-30 days)  Member will keep and attend follow up with nutritionist within the next 4 weeks  THN CM Short Term Goal #1 Start Date  03/05/17 [Goal not met, date reset]  [facility] CM Short Term Goal #1 Met Date  04/02/17  Interventions for Short Term Goal #1  Discussed with member the importance of following D. Plyler's plan of care and importance of keeping appointment in effort to effectively manage diabetes   THN CM Short Term Goal #2 (0-30 days)  Member will have new glucose meter within the next 2 weeks  THN CM Short Term Goal #2 Start Date  02/05/17  [facility] CM Short Term Goal #2 Met Date  -- [Not met]  Interventions for Short Term Goal #2  Received prescription for new glucose meter.  Advised to obtain as soon as possible in order to have readings from new meter for next office visit.  THN CM Short Term Goal #3 (0-30 days)  Member will report compliance with new Humulin 70/30 over the next 4 weeks  THN CM Short Term Goal #3 Start Date  04/02/17  Interventions for Short Tern Goal #3  Educated on importance of compliance with new insulin.  Edcuated on correct use of insulin pen with return demonstration.  THN CM Short Term Goal #4 (0-30 days)  Member will keep and attend follow up appointment with primary MD and dietician within the next 4 weeks  THN CM Short Term Goal #4 Start Date  04/02/17   Interventions for Short Term Goal #4  Educated on the importance of attending follow up appointment as they will evaluate the result of using new insulin regime.     Valente David, [place], MSN [place] [phone]

## 2017-04-03 NOTE — Telephone Encounter (Signed)
Late Entry: Called number provider ([facility]), but they needed the name medication that needs a PA, which I was unable to provide.  Pt was then seen on 03/20/2017 and stated there was a medication the doctor wanted her to start, but she refused.  No further action needed at this time.

## 2017-04-04 ENCOUNTER — Other Ambulatory Visit: Payer: Self-pay | Admitting: *Deleted

## 2017-04-04 NOTE — Patient Outreach (Signed)
[facility]) Care Management  04/04/2017  [name] 08-Jan-1951 [id number]   Voice message received from member stating that she went to the dentist yesterday for a cleaning, which resulted in some excessive bleeding.  She report that her gums bled from 3pm yesterday to 5am this morning.  She reports that she feel this is due to the change in her insulin and has stopped taking the Humulin and has restarted taking the Novolog.  Call placed to member to discuss concerns, no answer, HIPAA compliant voice message left.  Will await call back.    Valente David, [place], MSN [place] [phone]

## 2017-04-05 ENCOUNTER — Other Ambulatory Visit: Payer: Self-pay | Admitting: *Deleted

## 2017-04-05 NOTE — Patient Outreach (Addendum)
[name]) Care Management  04/05/2017  [name] September 11, 1951 [id number]   Voice message received back from member after office hours.  Call placed back to member this morning, no answer.  HIPAA compliant voice message left.  Will await call back.    Update @ 1100:  Call received back from member, she expresses concern regarding her bleeding gums after her dentist appointment, relating complication to Humulin insulin started on Monday.  This care manager inquired about the member's last dentist appointment, she report about 5 years ago.  Lack of routine dental hygiene and use of daily aspirin discussed as an possible contributor to bleeding.  Member is unreceptive of this idea, adamant about "knowing" that it was the insulin.  Educated on insulin and common complications as well as reviewed complications daily aspirin and dental work again.  Encouraged member to restart Humulin as instructed in preparation of follow up next month.  She is hesitant, but reluctantly agrees, stating "if I start bleeding again I'm gonna stop taking it."  She is advised to contact this care manager or her primary MD office prior to stopping.  She verbalizes understanding.  Will follow up next month.  Valente David, [place], MSN [place] [phone]

## 2017-04-06 ENCOUNTER — Other Ambulatory Visit: Payer: Self-pay | Admitting: Internal Medicine

## 2017-04-06 DIAGNOSIS — I10 Essential (primary) hypertension: Secondary | ICD-10-CM

## 2017-04-06 NOTE — Telephone Encounter (Signed)
Spoke with [name] about her plan to lower her A1C: She agrees that she wants her blood sugars lower and that she is trying to be more active and  trying to not eat the chips and junk foods as much and cut back on fried foods.  I asked her how it was working and she said that her blood sugars were in the 140-200 but then she went to the dentist and her teeth began to bleed and she thought it was the Humulin insulin causing it, so she began to take her old insulin again and her blood sugars went back up . She says she hs not been back on the Humulin long enough for it to lower her sugars back down again. Encouraged he to keep working on it.  She agreed to a follow up call periodically (first week of June) and reports that in office visits very helpful. I reviewed [facility] notes about patient not wanting to take insulin. This is common, but plan to administer DDS2/17 at her next visit to better assess her needs.

## 2017-04-10 ENCOUNTER — Ambulatory Visit (AMBULATORY_SURGERY_CENTER): Payer: Medicare Other | Admitting: Internal Medicine

## 2017-04-10 ENCOUNTER — Encounter: Payer: Self-pay | Admitting: Internal Medicine

## 2017-04-10 VITALS — BP 137/68 | HR 76 | Temp 96.8°F | Resp 14 | Ht 62.75 in | Wt 180.0 lb

## 2017-04-10 DIAGNOSIS — R195 Other fecal abnormalities: Secondary | ICD-10-CM | POA: Diagnosis not present

## 2017-04-10 DIAGNOSIS — D124 Benign neoplasm of descending colon: Secondary | ICD-10-CM | POA: Diagnosis not present

## 2017-04-10 DIAGNOSIS — D123 Benign neoplasm of transverse colon: Secondary | ICD-10-CM | POA: Diagnosis not present

## 2017-04-10 DIAGNOSIS — K219 Gastro-esophageal reflux disease without esophagitis: Secondary | ICD-10-CM | POA: Diagnosis not present

## 2017-04-10 DIAGNOSIS — D122 Benign neoplasm of ascending colon: Secondary | ICD-10-CM

## 2017-04-10 MED ORDER — SODIUM CHLORIDE 0.9 % IV SOLN: 500.0000 mL | INTRAVENOUS | Status: DC

## 2017-04-10 NOTE — Progress Notes (Signed)
Called to room to assist during endoscopic procedure.  Patient ID and intended procedure confirmed with present staff. Received instructions for my participation in the procedure from the performing physician.  

## 2017-04-10 NOTE — Patient Instructions (Signed)
Discharge instructions given. Handout on polyps. Resume previous medications. Resume Plavix,aspirin and Pletal today. YOU HAD AN ENDOSCOPIC PROCEDURE TODAY AT [facility]:   Refer to the procedure report that was given to you for any specific questions about what was found during the examination.  If the procedure report does not answer your questions, please call your gastroenterologist to clarify.  If you requested that your care partner not be given the details of your procedure findings, then the procedure report has been included in a sealed envelope for you to review at your convenience later.  YOU SHOULD EXPECT: Some feelings of bloating in the abdomen. Passage of more gas than usual.  Walking can help get rid of the air that was put into your GI tract during the procedure and reduce the bloating. If you had a lower endoscopy (such as a colonoscopy or flexible sigmoidoscopy) you may notice spotting of blood in your stool or on the toilet paper. If you underwent a bowel prep for your procedure, you may not have a normal bowel movement for a few days.  Please Note:  You might notice some irritation and congestion in your nose or some drainage.  This is from the oxygen used during your procedure.  There is no need for concern and it should clear up in a day or so.  SYMPTOMS TO REPORT IMMEDIATELY:   Following lower endoscopy (colonoscopy or flexible sigmoidoscopy):  Excessive amounts of blood in the stool  Significant tenderness or worsening of abdominal pains  Swelling of the abdomen that is new, acute  Fever of 100F or higher   For urgent or emergent issues, a gastroenterologist can be reached at any hour by calling [phone].   DIET:  We do recommend a small meal at first, but then you may proceed to your regular diet.  Drink plenty of fluids but you should avoid alcoholic beverages for 24 hours.  ACTIVITY:  You should plan to take it easy for the rest of today  and you should NOT DRIVE or use heavy machinery until tomorrow (because of the sedation medicines used during the test).    FOLLOW UP: Our staff will call the number listed on your records the next business day following your procedure to check on you and address any questions or concerns that you may have regarding the information given to you following your procedure. If we do not reach you, we will leave a message.  However, if you are feeling well and you are not experiencing any problems, there is no need to return our call.  We will assume that you have returned to your regular daily activities without incident.  If any biopsies were taken you will be contacted by phone or by letter within the next 1-3 weeks.  Please call us at [phone] if you have not heard about the biopsies in 3 weeks.    SIGNATURES/CONFIDENTIALITY: You and/or your care partner have signed paperwork which will be entered into your electronic medical record.  These signatures attest to the fact that that the information above on your After Visit Summary has been reviewed and is understood.  Full responsibility of the confidentiality of this discharge information lies with you and/or your care-partner.

## 2017-04-10 NOTE — Op Note (Signed)
[place] Patient Name: Zamora Colton Procedure Date: 04/10/2017 2:36 PM MRN: [id number] Endoscopist: Docia Chuck. Henrene Pastor , MD Age: 66 Referring MD:  Date of Birth: 11-02-1951 Gender: Female Account #: [id number] Procedure:                Colonoscopy, with cold snare polypectomy X4 Indications:              Heme positive stool. Prior colonoscopy 2004                            elsewhere was negative Medicines:                Monitored Anesthesia Care Procedure:                Pre-Anesthesia Assessment:                           - Prior to the procedure, a History and Physical                            was performed, and patient medications and                            allergies were reviewed. The patient's tolerance of                            previous anesthesia was also reviewed. The risks                            and benefits of the procedure and the sedation                            options and risks were discussed with the patient.                            All questions were answered, and informed consent                            was obtained. Prior Anticoagulants: The patient has                            taken Plavix (clopidogrel), last dose was 1 day                            prior to procedure. Patient also on Pletal and                            aspirin for history of CVA -2. Told to continue on                            all agents for the examination. ASA Grade                            Assessment: III - A patient with severe systemic  disease. After reviewing the risks and benefits,                            the patient was deemed in satisfactory condition to                            undergo the procedure.                           After obtaining informed consent, the colonoscope                            was passed under direct vision. Throughout the                            procedure, the patient's blood pressure,  pulse, and                            oxygen saturations were monitored continuously. The                            Colonoscope was introduced through the anus and                            advanced to the the cecum, identified by                            appendiceal orifice and ileocecal valve. The                            ileocecal valve, appendiceal orifice, and rectum                            were photographed. The quality of the bowel                            preparation was excellent. The colonoscopy was                            performed without difficulty. The patient tolerated                            the procedure well. The bowel preparation used was                            SUPREP. Scope In: 2:49:08 PM Scope Out: 3:19:10 PM Scope Withdrawal Time: 0 hours 26 minutes 29 seconds  Total Procedure Duration: 0 hours 30 minutes 2 seconds  Findings:                 Four polyps were found in the descending colon(6 mm                            pedunculated), transverse colon (2 mm, 3 mm) and  ascending colon(10 mm pedunculated). The polyps                            were 2 to 10 mm in size. These polyps were removed                            with a cold snare. Resection and retrieval were                            complete. There was low-grade but persistent oozing                            the descending colon polyp which was treated with                            snare tip cautery with hemostasis.                           The exam was otherwise without abnormality on                            direct and retroflexion views. Complications:            No immediate complications. Estimated blood loss:                            None. Estimated Blood Loss:     Estimated blood loss: none. Impression:               - Four 2 to 10 mm polyps in the descending colon,                            in the transverse colon and in the ascending colon,                             removed with a cold snare. Resected and retrieved.                           - The examination was otherwise normal on direct                            and retroflexion views. Recommendation:           - Repeat colonoscopy in 3 years for surveillance.                           - Resume (continued) aspirin today, Plavix                            (clopidogrel) today and Pletal (cilostazol) today                            at prior doses.                           -  Patient has a contact number available for                            emergencies. The signs and symptoms of potential                            delayed complications were discussed with the                            patient. Return to normal activities tomorrow.                            Written discharge instructions were provided to the                            patient.                           - Resume previous diet.                           - Continue present medications.                           - Await pathology results. Docia Chuck. Henrene Pastor, MD 04/10/2017 3:27:46 PM This report has been signed electronically.

## 2017-04-10 NOTE — Progress Notes (Signed)
A/ox3 pleased with MAC, report to [facility]

## 2017-04-11 ENCOUNTER — Telehealth: Payer: Self-pay | Admitting: *Deleted

## 2017-04-11 NOTE — Telephone Encounter (Signed)
  Follow up Call-  Call back number 04/10/2017  Post procedure Call Back phone  # [phone]  Permission to leave phone message Yes  Some recent data might be hidden     Patient questions:  Message left to call us if necessary.

## 2017-04-11 NOTE — Telephone Encounter (Signed)
  Follow up Call-  Call back number 04/10/2017  Post procedure Call Back phone  # [phone]  Permission to leave phone message Yes  Some recent data might be hidden     Patient questions:  Do you have a fever, pain , or abdominal swelling? No. Pain Score  0 *  Have you tolerated food without any problems? Yes.    Have you been able to return to your normal activities? Yes.    Do you have any questions about your discharge instructions: Diet   No. Medications  No. Follow up visit  No.  Do you have questions or concerns about your Care? Yes.    Actions: * If pain score is 4 or above: No action needed, pain <4.  Pt. States that she has had 2 bowel movements since arriving home yesterday.  She states she has seen large amount of dark blood when she has had bowel movements on paper and in commode..  No other signs of bleeding. She does not have fever.  No abdominal distention.

## 2017-04-11 NOTE — Telephone Encounter (Signed)
I spoke with [name] who states that the patient described her bowel movement as dark. Last one occurring this morning. As discussed in my procedure note yesterday, her examination was purposely performed on antiplatelet therapy because of prior strokes. 2 tiny mid colonic polyps removed without any bleeding. Moderate sized right-sided colon polyp removed without bleeding. And moderate left-sided colon polyp removed with oozing that was adequately treated with cautery. At this point I would recommend continuing to monitor closely. My expectation would be that her bowel movements will continually clear up. However, if she notices red blood per rectum significantly or clots, she should go to the emergency room for evaluation. Please let her know. Thanks [name]. Dr. Henrene Pastor

## 2017-04-11 NOTE — Telephone Encounter (Signed)
Spoke with pt. I advised her that Dr. Henrene Pastor wants her to go to emergency room right away if she notices any bright red bleeding or bright red clots.  She verbalized understanding.

## 2017-04-16 ENCOUNTER — Encounter: Payer: Self-pay | Admitting: Internal Medicine

## 2017-04-24 ENCOUNTER — Ambulatory Visit: Payer: Medicare Other | Admitting: Podiatry

## 2017-04-24 NOTE — Telephone Encounter (Signed)
No answer left message to call if questions or concerns. 

## 2017-04-25 ENCOUNTER — Ambulatory Visit (INDEPENDENT_AMBULATORY_CARE_PROVIDER_SITE_OTHER): Payer: Medicare Other | Admitting: Internal Medicine

## 2017-04-25 ENCOUNTER — Ambulatory Visit (INDEPENDENT_AMBULATORY_CARE_PROVIDER_SITE_OTHER): Payer: Medicare Other | Admitting: Dietician

## 2017-04-25 ENCOUNTER — Encounter: Payer: Self-pay | Admitting: Internal Medicine

## 2017-04-25 VITALS — BP 138/50 | HR 71 | Temp 98.2°F | Wt 176.6 lb

## 2017-04-25 VITALS — Wt 176.6 lb

## 2017-04-25 DIAGNOSIS — Z8673 Personal history of transient ischemic attack (TIA), and cerebral infarction without residual deficits: Secondary | ICD-10-CM

## 2017-04-25 DIAGNOSIS — E1122 Type 2 diabetes mellitus with diabetic chronic kidney disease: Secondary | ICD-10-CM

## 2017-04-25 DIAGNOSIS — R1319 Other dysphagia: Secondary | ICD-10-CM

## 2017-04-25 DIAGNOSIS — Z0000 Encounter for general adult medical examination without abnormal findings: Secondary | ICD-10-CM

## 2017-04-25 DIAGNOSIS — N183 Chronic kidney disease, stage 3 unspecified: Secondary | ICD-10-CM

## 2017-04-25 DIAGNOSIS — Z713 Dietary counseling and surveillance: Secondary | ICD-10-CM | POA: Diagnosis not present

## 2017-04-25 DIAGNOSIS — E118 Type 2 diabetes mellitus with unspecified complications: Secondary | ICD-10-CM | POA: Diagnosis not present

## 2017-04-25 DIAGNOSIS — R1310 Dysphagia, unspecified: Secondary | ICD-10-CM

## 2017-04-25 DIAGNOSIS — I739 Peripheral vascular disease, unspecified: Secondary | ICD-10-CM

## 2017-04-25 DIAGNOSIS — E1151 Type 2 diabetes mellitus with diabetic peripheral angiopathy without gangrene: Secondary | ICD-10-CM

## 2017-04-25 DIAGNOSIS — R195 Other fecal abnormalities: Secondary | ICD-10-CM

## 2017-04-25 DIAGNOSIS — H9201 Otalgia, right ear: Secondary | ICD-10-CM

## 2017-04-25 DIAGNOSIS — Z86010 Personal history of colonic polyps: Secondary | ICD-10-CM

## 2017-04-25 DIAGNOSIS — Z794 Long term (current) use of insulin: Secondary | ICD-10-CM

## 2017-04-25 DIAGNOSIS — E039 Hypothyroidism, unspecified: Secondary | ICD-10-CM | POA: Diagnosis not present

## 2017-04-25 DIAGNOSIS — I10 Essential (primary) hypertension: Secondary | ICD-10-CM

## 2017-04-25 LAB — GLUCOSE, CAPILLARY: Glucose-Capillary: 174 mg/dL — ABNORMAL HIGH (ref 65–99)

## 2017-04-25 LAB — POCT GLYCOSYLATED HEMOGLOBIN (HGB A1C): HEMOGLOBIN A1C: 8.8

## 2017-04-25 NOTE — Progress Notes (Signed)
CC: ear pain  HPI:  [name] is a 66 y.o. with a PMH of insulin-dependent T2DM, CKD stage 3, hypothyroidism, CVA, presenting to clinic for follow up on her diabetes and new ear pain.  Patient has had uncontrolled diabetes and has met several times in the last couple of months with physicians in our acute care clinic and with our dietician and diabetes educator. Patient is pretty resistant to changing medications or dosages of current medicines, however is making an effort in improving her diet and increasing her activity level.   Patient states that she takes her Humulin 70/30 mix 36 units twice a day every day (though did stop for a couple of days as she thought it was causing bleeding after a colonoscopy (4 polyps removed)). She reports taking Trajenta most of the time, but does report every few days taking a medicine holiday from her pills. She has been working on her diet and making healthy changes. She brings in her glucose log which shows AM fasting glucose measurements only - this shows 3-4 day runs of glucose ranging 100-150 between a couple of days of glucose in the in 240-250's which correlate to her pill holidays. She reports she tries to walk frequently but is limited due to claudication in her left leg - she is supposed to be on daily pletal for this but continues to take it only PRN. She denies polyuria, polydipsia, nausea, vomiting, diarrhea. We spoke about other treatment options for her diabetes but she reports vague side-effects to each of them that do not usually correlate to known side-effects.   Patient reports having a right ear pain that radiates to her left head and neck for the past week, which she thought was due to her switching from Novolin to Humulin insulin. She denies fevers, chills, swelling, discharge, hearing loss, or vision change. She states that it started out itching but transitioned to pain when she began scratching it and then putting mineral oil in it.  She has never had this problem before.  Please see problem based Assessment and Plan for status of patients chronic conditions.  Past Medical History:  Diagnosis Date  . Anemia   . Arteriosclerotic cardiovascular disease   . Claudication in peripheral vascular disease ([place])   . CVA (cerebral vascular accident) ([place]) 01/2011   affected rt side  . Diabetes mellitus   . GERD (gastroesophageal reflux disease)   . Hyperlipidemia   . Hypertension   . Hypocholesterolemia   . Hypothyroidism   . Peripheral vascular disease ([place])   . Renal insufficiency   . Vitamin D deficiency     Review of Systems:   Review of Systems  Constitutional: Negative for chills, fever and malaise/fatigue.  HENT: Positive for ear pain. Negative for congestion, ear discharge, hearing loss and sinus pain.   Eyes: Negative for blurred vision and double vision.  Respiratory: Negative for cough, hemoptysis, sputum production, shortness of breath and wheezing.   Cardiovascular: Positive for claudication. Negative for chest pain and leg swelling.  Gastrointestinal: Negative for abdominal pain, blood in stool, constipation, diarrhea, melena, nausea and vomiting.  Genitourinary: Negative for dysuria, frequency, hematuria and urgency.  Musculoskeletal: Negative for falls.  Skin: Positive for itching (ear). Negative for rash.  Neurological: Negative for dizziness, focal weakness, weakness and headaches.  Endo/Heme/Allergies: Negative for polydipsia.    Physical Exam:  Vitals:   04/25/17 1410  BP: (!) 138/50  Pulse: 71  Temp: 98.2 F (36.8 C)  TempSrc: Oral  SpO2: 100%  Weight: 176 lb 9.6 oz (80.1 kg)   Physical Exam  Constitutional: She is oriented to person, place, and time. She appears well-developed and well-nourished. No distress.  HENT:  Head: Normocephalic and atraumatic.  Right Ear: Hearing, tympanic membrane, external ear and ear canal normal. No lacerations. No drainage, swelling or tenderness. No  foreign bodies. No mastoid tenderness. Tympanic membrane is not injected, not scarred, not perforated, not erythematous, not retracted and not bulging. No middle ear effusion. No hemotympanum.  Left Ear: Hearing and external ear normal.  Mouth/Throat: Oropharynx is clear and moist and mucous membranes are normal. No dental abscesses or uvula swelling. No oropharyngeal exudate, posterior oropharyngeal edema, posterior oropharyngeal erythema or tonsillar abscesses.  No tenderness to palpation and percussion, or swelling over frontal, maxillary or mastoid sinuses  Eyes: Conjunctivae and EOM are normal.  Neck: Trachea normal. Muscular tenderness present. No edema, no erythema and normal range of motion present.    Cardiovascular: Normal rate, regular rhythm, normal heart sounds and intact distal pulses.  Exam reveals no gallop and no friction rub.   No murmur heard. Pulmonary/Chest: Effort normal and breath sounds normal. She has no wheezes. She has no rales.  Abdominal: Soft. Bowel sounds are normal. There is no tenderness.  Lymphadenopathy:       Head (right side): No submental, no submandibular, no preauricular and no posterior auricular adenopathy present.       Head (left side): No submental, no submandibular, no preauricular and no posterior auricular adenopathy present.  Neurological: She is alert and oriented to person, place, and time.  Skin: Skin is warm and dry. Capillary refill takes less than 2 seconds. She is not diaphoretic. No erythema.  Psychiatric: She has a normal mood and affect. Her behavior is normal. Judgment and thought content normal.    Assessment & Plan:   See Encounters Tab for problem based charting.   Patient seen with Dr. Abelardo Diesel, MD Internal Medicine PGY1

## 2017-04-25 NOTE — Progress Notes (Signed)
  Medical Nutrition Therapy:  Appt start time: 1400 end time:  1430. Visit #8   Assessment:  Primary concerns today: glycemic control. [name] reports  working on lifestyle changes of increased activity and healthier food choices. Would like to eat more greens. Tries to be as active as possible. Has family support in diabetes.  Diabetes distress 2 score was 4- "somehat a serious problem" However, Diabetes Distress 17 score<2 and  emotional burden was highest at 2.8 (Between slight problem- moderate problem) , physician related distress, regimen related distress and interpersonal distress were all < 2 (not a problem)  [name] is content with her lower A1C and care. She does not want calls between visits, but agrees to visit in 4 weeks Meter download for past 30 days:average 225 (decreased by 26 mg/dl), range 94-331, testing 1x/day, 78% above 180 mg/dl, all values between 530 and 11 AM  Food intake: Reports beans for fiber, bologna sandwich at bedtime due to hunger.   Weight- 176.6#, trending down Medications: assures me she Takes insulin 36 units twice daily  12 hours apart at 730 and 730 PM, before her AM meal consistently, but before her PM meal varies because she is often not home.   Recommend moderate carb, healthy lower fat, high fiber diet for weight loss to help with insulin resistance 1400-1500 calories 165-170 g carbohydrates 85-95 g protein 50 g fat  Progress Towards Goal(s):  Some progress.   Nutritional Diagnosis:  NB-1.1 Food and nutrition-related knowledge deficit As related to lack of prior adequate diabetes meal planning is improving  As evidenced by her report and  food choices .    Intervention:  Nutrition education and counseling about how her food and medicine affected her blood sugar. Education about options to improve glycemic control and constipation.  Coordination of care- discussed care with [name]  Teaching Method Utilized: Visual, Auditory,Hands  on Handouts given during visit include: AVS Barriers to learning/adherence to lifestyle change: very limited finances Demonstrated degree of understanding via:  Teach Back   Monitoring/Evaluation:  Dietary intake, exercise, meter, and body weight .-4 weeks [name], [name], RD 04/25/2017 5:00 PM.

## 2017-04-25 NOTE — Assessment & Plan Note (Addendum)
Colonoscopy in May 2018 showed a few polyps, largest was 63mm, all were tubular adenomas without malignancy noted. She needs a repeat colonoscopy in 3 years unless indicated sooner. She had some mild bleeding post-procedure likely due to dual anti-platelet therapy, which has resolved since.

## 2017-04-25 NOTE — Patient Instructions (Signed)
Your A1c was 8.8 today! Great job!  Keep doing a great job in modifying your diet. Once you start taking your Pletal every day, I think you're going to be able to walk more often and longer which will help with controlling your weight and your diabetes.  Keep taking Humulin 36 units twice daily, and trajenta every day.  For your ear, you can take 2 regular tylenol up to 3 times a day to help with the pain and use warm compresses. If your pain gets worse, you start having fevers or swelling, please come back to be evaluated.

## 2017-04-26 ENCOUNTER — Other Ambulatory Visit: Payer: Self-pay | Admitting: *Deleted

## 2017-04-26 ENCOUNTER — Encounter: Payer: Self-pay | Admitting: Internal Medicine

## 2017-04-26 ENCOUNTER — Ambulatory Visit: Payer: Self-pay | Admitting: *Deleted

## 2017-04-26 DIAGNOSIS — H9201 Otalgia, right ear: Secondary | ICD-10-CM | POA: Insufficient documentation

## 2017-04-26 NOTE — Patient Outreach (Signed)
[facility]) Care Management  04/26/2017  [name] 30-Aug-1951 [id number]   Call placed to member to confirm home visit for this afternoon.  She report that she will have to reschedule as she will not be home at the scheduled time.  Visit rescheduled for within the next 2 weeks, she denies any urgent concerns at this time.  Valente David, [place], MSN [place] [phone]

## 2017-04-26 NOTE — Assessment & Plan Note (Signed)
Patient with claudication with exertion. She takes pletal as needed and does have relief with it.   Plan: --advised patient to take pletal every day to help increase her activity level and prevent claudication

## 2017-04-26 NOTE — Assessment & Plan Note (Signed)
A1c 8.8, improved from 9.8 three months ago. Patient still resistant to changing medical therapy but has made dietary changes and appears motivated. I think her activity level will increase when her claudication improves with regularly taking pletal instead of PRN. She appreciates diabetes educator visits and finds them helpful.  Plan: --continue Humulin 70/30 36 units BID --continue Tradjenta 5mg  daily --encouraged her to continue working on her lifestyle changes --continue meeting with diabetes coordinator every month --f/u with me in 3 months, will check Bmet and CBC at that time

## 2017-04-26 NOTE — Assessment & Plan Note (Addendum)
Patient reports having a right ear pain that radiates to her left head and neck for the past week, which she thought was due to her switching from Novolin to Humulin insulin. She denies fevers, chills, swelling, discharge, hearing loss, or vision change. She states that it started out itching but transitioned to pain when she began scratching it and then putting mineral oil in it. She has never had this problem before.  Exam reveals some cerumen, no pain with mobility of pinna or with probe insertion (only itching), normal TM, no swelling or tenderness over sinuses. She did have some muscular pain in right upper neck, without lymphadenopathy, no fluctuance.  Plan: --advised patient to not place anything in ear including fingers, oil, or cotton --advised to used warm compress on neck; tylenol --advised to return to clinic if pain getting worse or not improving, new fevers over next week.

## 2017-04-26 NOTE — Assessment & Plan Note (Signed)
Patient saw GI, who ordered a barium swallow - her pill dysphagia correlated with pill stopping at aortic arch; CXR did not show any vascular abnormality. No further workup planned unless symptoms change or worsen.

## 2017-04-26 NOTE — Assessment & Plan Note (Signed)
Patient prefers to have PAP smear completed at next PCP visit.

## 2017-04-30 ENCOUNTER — Other Ambulatory Visit: Payer: Self-pay | Admitting: Dietician

## 2017-04-30 DIAGNOSIS — N183 Chronic kidney disease, stage 3 unspecified: Secondary | ICD-10-CM

## 2017-04-30 DIAGNOSIS — E1122 Type 2 diabetes mellitus with diabetic chronic kidney disease: Secondary | ICD-10-CM

## 2017-04-30 NOTE — Patient Instructions (Addendum)
[name],  Your blood sugars are better! Congratulations!  To help care for your kidneys and body the most, they need to average about 155 mg/dl or less. Half the time below 155 and half the time can be 160-180 to get an average of 155.   Before meals- 100-140 1,2 or 3 hours after meals can be as high as 150-180. 4,5,6 hours after a meal should be back to 100-140.  All activity helps even if it is laying on the floor and doing leg or back exercises.    Back Exercises If you have pain in your back, do these exercises 2-3 times each day or as told by your doctor. When the pain goes away, do the exercises once each day, but repeat the steps more times for each exercise (do more repetitions). If you do not have pain in your back, do these exercises once each day or as told by your doctor. Exercises Single Knee to Chest  Do these steps 3-5 times in a row for each leg: 1. Lie on your back on a firm bed or the floor with your legs stretched out. 2. Bring one knee to your chest. 3. Hold your knee to your chest by grabbing your knee or thigh. 4. Pull on your knee until you feel a gentle stretch in your lower back. 5. Keep doing the stretch for 10-30 seconds. 6. Slowly let go of your leg and straighten it.  Pelvic Tilt  Do these steps 5-10 times in a row: 1. Lie on your back on a firm bed or the floor with your legs stretched out. 2. Bend your knees so they point up to the ceiling. Your feet should be flat on the floor. 3. Tighten your lower belly (abdomen) muscles to press your lower back against the floor. This will make your tailbone point up to the ceiling instead of pointing down to your feet or the floor. 4. Stay in this position for 5-10 seconds while you gently tighten your muscles and breathe evenly.  Cat-Cow  Do these steps until your lower back bends more easily: 1. Get on your hands and knees on a firm surface. Keep your hands under your shoulders, and keep your knees under  your hips. You may put padding under your knees. 2. Let your head hang down, and make your tailbone point down to the floor so your lower back is round like the back of a cat. 3. Stay in this position for 5 seconds. 4. Slowly lift your head and make your tailbone point up to the ceiling so your back hangs low (sags) like the back of a cow. 5. Stay in this position for 5 seconds.  Press-Ups  Do these steps 5-10 times in a row: 1. Lie on your belly (face-down) on the floor. 2. Place your hands near your head, about shoulder-width apart. 3. While you keep your back relaxed and keep your hips on the floor, slowly straighten your arms to raise the top half of your body and lift your shoulders. Do not use your back muscles. To make yourself more comfortable, you may change where you place your hands. 4. Stay in this position for 5 seconds. 5. Slowly return to lying flat on the floor.  Bridges  Do these steps 10 times in a row: 1. Lie on your back on a firm surface. 2. Bend your knees so they point up to the ceiling. Your feet should be flat on the floor. 3. Tighten your  butt muscles and lift your butt off of the floor until your waist is almost as high as your knees. If you do not feel the muscles working in your butt and the back of your thighs, slide your feet 1-2 inches farther away from your butt. 4. Stay in this position for 3-5 seconds. 5. Slowly lower your butt to the floor, and let your butt muscles relax.  If this exercise is too easy, try doing it with your arms crossed over your chest. Belly Crunches  Do these steps 5-10 times in a row: 1. Lie on your back on a firm bed or the floor with your legs stretched out. 2. Bend your knees so they point up to the ceiling. Your feet should be flat on the floor. 3. Cross your arms over your chest. 4. Tip your chin a little bit toward your chest but do not bend your neck. 5. Tighten your belly muscles and slowly raise your chest just enough  to lift your shoulder blades a tiny bit off of the floor. 6. Slowly lower your chest and your head to the floor.  Back Lifts Do these steps 5-10 times in a row: 1. Lie on your belly (face-down) with your arms at your sides, and rest your forehead on the floor. 2. Tighten the muscles in your legs and your butt. 3. Slowly lift your chest off of the floor while you keep your hips on the floor. Keep the back of your head in line with the curve in your back. Look at the floor while you do this. 4. Stay in this position for 3-5 seconds. 5. Slowly lower your chest and your face to the floor.  Contact a doctor if:  Your back pain gets a lot worse when you do an exercise.  Your back pain does not lessen 2 hours after you exercise. If you have any of these problems, stop doing the exercises. Do not do them again unless your doctor says it is okay. Get help right away if:  You have sudden, very bad back pain. If this happens, stop doing the exercises. Do not do them again unless your doctor says it is okay.

## 2017-04-30 NOTE — Progress Notes (Signed)
Request Referral for additional hours of Medical Nutrition Therapy.

## 2017-05-07 NOTE — Progress Notes (Signed)
Internal Medicine Clinic Attending  I saw and evaluated the patient.  I personally confirmed the key portions of the history and exam documented by Dr. Svalina and I reviewed pertinent patient test results.  The assessment, diagnosis, and plan were formulated together and I agree with the documentation in the resident's note.  

## 2017-05-09 ENCOUNTER — Other Ambulatory Visit: Payer: Self-pay | Admitting: *Deleted

## 2017-05-09 NOTE — Patient Outreach (Signed)
[facility]) Care Management   05/09/2017  KWYNN SCHLOTTER 1951-03-13 [id number]  SHAKTHI SCIPIO is an 66 y.o. female  Subjective:   Member alert and oriented x3, denies pain or discomfort.  She report that she has been compliant with all medications, including insulin.  She also report compliance with daily blood sugar monitoring, denies episodes of hypoglycemia.  She state she continue to struggle with diet and exercise, but is willing to continue to work in improvement with assistance of D. Plyler, dietician.  Objective:   Review of Systems  Constitutional: Negative.   HENT: Negative.   Eyes: Negative.   Respiratory: Negative.   Cardiovascular: Negative.   Gastrointestinal: Negative.   Genitourinary: Negative.   Musculoskeletal: Negative.   Skin: Negative.   Neurological: Negative.   Endo/Heme/Allergies: Negative.   Psychiatric/Behavioral: Negative.     Physical Exam  Constitutional: She is oriented to person, place, and time. She appears well-developed and well-nourished.  Neck: Normal range of motion.  Cardiovascular: Normal rate, regular rhythm and normal heart sounds.   Respiratory: Effort normal and breath sounds normal.  GI: Soft. Bowel sounds are normal.  Musculoskeletal: Normal range of motion.  Neurological: She is alert and oriented to person, place, and time.  Skin: Skin is warm and dry.   BP 122/74 (BP Location: Left Arm, Patient Position: Sitting, Cuff Size: Normal)   Pulse 74   Resp 18   SpO2 97%   Encounter Medications:   Outpatient Encounter Prescriptions as of 05/09/2017  Medication Sig Note  . aspirin EC 81 MG tablet Take 81 mg by mouth daily.   . BD INSULIN SYRINGE ULTRAFINE 31G X 15/64" 0.5 ML MISC USE TO INJECT INSULIN TWO TIMES A DAY   . cilostazol (PLETAL) 100 MG tablet Take 100 mg by mouth 2 (two) times daily. 05/09/2017: Ordered twice a day, taking only as needed  . clopidogrel (PLAVIX) 75 MG tablet TAKE 1 TABLET BY MOUTH  EVERY MORNING   . gemfibrozil (LOPID) 600 MG tablet Take 600 mg by mouth 2 (two) times daily before a meal.   . glucose blood (ACCU-CHEK AVIVA PLUS) test strip Use to check blood sugars twice a day.Dx Code: E11.22.   . hydrochlorothiazide (HYDRODIURIL) 25 MG tablet Take 1 tablet (25 mg total) by mouth daily.   . Insulin Isophane & Regular Human (HUMULIN 70/30 MIX) (70-30) 100 UNIT/ML PEN Inject 36 Units into the skin 2 (two) times daily.   . Insulin Pen Needle (B-D UF III MINI PEN NEEDLES) 31G X 5 MM MISC 1 Dose by Does not apply route 2 (two) times daily. The patient is insulin requiring, ICD 10 code E11.22 .   . levothyroxine (SYNTHROID) 88 MCG tablet Take 1 tablet (88 mcg total) by mouth daily before breakfast.   . losartan (COZAAR) 100 MG tablet Take 1 tablet (100 mg total) by mouth daily.   . ranitidine (ZANTAC) 150 MG tablet TAKE 1 TABLET BY MOUTH EVERY DAY AS NEEDED   . TRADJENTA 5 MG TABS tablet TAKE 1 TABLET BY MOUTH EVERY DAY   . glipiZIDE (GLUCOTROL XL) 10 MG 24 hr tablet Take 1 tablet (10 mg total) by mouth daily with breakfast. (Patient not taking: Reported on 04/10/2017)    No facility-administered encounter medications on file as of 05/09/2017.     Functional Status:   In your present state of health, do you have any difficulty performing the following activities: 04/25/2017 03/20/2017  Hearing? N N  Vision? N N  Difficulty concentrating or making decisions? N N  Walking or climbing stairs? N N  Dressing or bathing? N Y  Doing errands, shopping? N N  Preparing Food and eating ? - -  Using the Toilet? - -  In the past six months, have you accidently leaked urine? - -  Do you have problems with loss of bowel control? - -  Managing your Medications? - -  Managing your Finances? - -  Housekeeping or managing your Housekeeping? - -  Some recent data might be hidden    Fall/Depression Screening:    Fall Risk  04/25/2017 03/20/2017 08/29/2016  Falls in the past year? No No No    PHQ 2/9 Scores 04/25/2017 03/20/2017 08/29/2016 08/10/2016 05/01/2016 10/29/2015 09/17/2015  PHQ - 2 Score 0 0 0 0 0 0 0    Assessment:    Met with member at scheduled time.  All current goals complete as her last A1C was 8.8.  Additional encouragement provided as she remains skeptical regarding continued use of insulin.  Diet and exercise discussed related to proper diabetes management.  Discussed proper use of Pletal in effort to decrease pain in legs to increase exercise tolerance.  She report she attempted to take daily but stopped due to muscle spasms.  Encouraged to continue to try to increase exercise as well as monitoring diet.  She is aware that all goals are met.  Discussed continued involvement with [facility] health coach.  She is initially hesitant, reminded of the progress made.  She agrees to Control and instrumentation engineer, made aware that this care manager is available if needs change.  Plan:   Will place referral to health coach. Will notify primary MD of discipline closure.  THN CM Care Plan Problem One     Most Recent Value  Care Plan Problem One  Self-Care Deficit related to diabetes management as evidenced by elevated A1C  Role Documenting the Problem One  Care Management Coordinator  Care Plan for Problem One  Active  THN Long Term Goal   Member's A1C will decrease from 9.7 to </= 8.5 over the next 90 days  THN Long Term Goal Start Date  02/05/17  [facility] Long Term Goal Met Date  05/09/17  Interventions for Problem One Long Term Goal  Educated member on ways to decrease A1C, including diet, exercise, daily blood sugar checks with recording for a visual trend.  THN CM Short Term Goal #1   Member will keep and attend follow up with nutritionist within the next 4 weeks  THN CM Short Term Goal #1 Start Date  03/05/17 [Goal not met, date reset]  THN CM Short Term Goal #1 Met Date  04/02/17  Interventions for Short Term Goal #1  Discussed with member the importance of following D. Plyler's plan of care  and importance of keeping appointment in effort to effectively manage diabetes   THN CM Short Term Goal #2   Member will have new glucose meter within the next 2 weeks  THN CM Short Term Goal #2 Start Date  02/05/17  [facility] CM Short Term Goal #2 Met Date  -- [Not met]  Interventions for Short Term Goal #2  Received prescription for new glucose meter.  Advised to obtain as soon as possible in order to have readings from new meter for next office visit.  THN CM Short Term Goal #3  Member will report compliance with new Humulin 70/30 over the next 4 weeks  THN CM Short Term Goal #3 Start  Date  04/02/17  [facility] CM Short Term Goal #3 Met Date  05/09/17  Interventions for Short Tern Goal #3  Educated on importance of compliance with new insulin.  Edcuated on correct use of insulin pen with return demonstration.  THN CM Short Term Goal #4  Member will keep and attend follow up appointment with primary MD and dietician within the next 4 weeks  THN CM Short Term Goal #4 Start Date  04/02/17  [facility] CM Short Term Goal #4 Met Date  05/09/17  Interventions for Short Term Goal #4  Educated on the importance of attending follow up appointment as they will evaluate the result of using new insulin regime.     Valente David, [place], MSN [place] [phone]

## 2017-05-11 ENCOUNTER — Other Ambulatory Visit: Payer: Self-pay

## 2017-05-11 NOTE — Patient Outreach (Signed)
[facility]) Care Management  05/11/2017  [name] [birth year] [id number]   Telephone call to patient for health coach introductory call.  Patient reports she is doing fine.  Patient reports that her sugars are doing ok.  Discussed with patient health coach role and that calls would be monthly.  Patient verbalized understanding and voices no concerns.  Plan: RN Health Coach will send introductory letter to patient.   RN Health Coach will contact patient in the month of July and patient agrees to next outreach.  Jone Baseman, RN, MSN [place] [phone]

## 2017-05-23 ENCOUNTER — Ambulatory Visit (INDEPENDENT_AMBULATORY_CARE_PROVIDER_SITE_OTHER): Payer: Medicare Other | Admitting: Dietician

## 2017-05-23 DIAGNOSIS — E119 Type 2 diabetes mellitus without complications: Secondary | ICD-10-CM

## 2017-05-23 DIAGNOSIS — E1122 Type 2 diabetes mellitus with diabetic chronic kidney disease: Secondary | ICD-10-CM

## 2017-05-23 DIAGNOSIS — Z713 Dietary counseling and surveillance: Secondary | ICD-10-CM | POA: Diagnosis not present

## 2017-05-23 DIAGNOSIS — N183 Chronic kidney disease, stage 3 (moderate): Principal | ICD-10-CM

## 2017-05-23 NOTE — Patient Instructions (Addendum)
Try drinking the boost for diabetes at night instead of the bologna sandwich  Can use Glucerna, boost for diabetes, slimfast instead of eating a meal.  Will mail information about plant based diet for diabetes

## 2017-05-24 NOTE — Addendum Note (Signed)
Addended by: Hulan Fray on: 05/24/2017 01:57 PM   Modules accepted: Orders

## 2017-05-24 NOTE — Progress Notes (Addendum)
  Medical Nutrition Therapy:  Appt start time: 1400 end time:  1430. Visit #9   Assessment:  Primary concerns today: glycemic control. [name] reports it has been harder over the past month to work on lifestyle changes: she is not taking the pletal because it makes her feel bad. She overexerted herself that caused her to limit her activity since then in an attempt to recover. She wants to know if there is an exercise program for rehab that she can attend. She has silver sneakers card, but feels a rehab or therapy would be better. She has been eating quick, cold foods and not her usual beans and greens. She does not want any different medicine but is willing to look at a plant based diet for diabetes. She is also willing to wear the CGM again to try to help her attain better control  Blood sugar: Meter download for past 30 days:average 246 (increased by 26 mg/dl), range 165-379, testing 1x/day, 93% above 180 mg/dl, all values between 530 and 11 AM Medications- no change, reports taking as directed  Food intake: Reports eggs, several slice of bologana or a bologna sandwich at bedtime due to hunger.   Weight- stable  Recommend plant based moderate carb, lower fat to very low fat, high fiber diet for weight loss to help with insulin resistance 1400-1500 calories 165-170 g carbohydrates 85-95 g protein 50 g fat  Progress Towards Goal(s):  Some progress.   Nutritional Diagnosis:  NB-1.1 Food and nutrition-related knowledge deficit As related to lack of prior adequate diabetes meal planning is improving  As evidenced by her report and  food choices .    Intervention:  Nutrition education and counseling about her blood sugar results and options to improve them. Education about meal and food options to improve glycemic control and constipation.  Coordination of care- recommend CGM at her August visit, ask Dr. Jari Favre about exercise therpay  Teaching Method Utilized: Visual, Auditory,Hands  on Handouts given during visit include: AVS Barriers to learning/adherence to lifestyle change: very limited finances Demonstrated degree of understanding via:  Teach Back   Monitoring/Evaluation:  Dietary intake, exercise, meter, and body weight .-4 weeks [name], [name], RD 05/24/2017 4:19 PM.

## 2017-06-07 ENCOUNTER — Other Ambulatory Visit: Payer: Self-pay

## 2017-06-07 NOTE — Patient Outreach (Signed)
[facility]) Care Management  06/07/2017  [name] 03-16-1951 [id number]   Telephone call to patient for initial assessment.  Patient reports she cannot talk right now.  Advised patient that health coach would call another time.   Plan: RN Health Coach will attempt patient within the next ten business days.  Jone Baseman, RN, MSN [place] [phone]

## 2017-06-21 ENCOUNTER — Other Ambulatory Visit: Payer: Self-pay

## 2017-06-21 NOTE — Patient Outreach (Signed)
[facility]) Care Management  [place]  06/21/2017   [name] 11/29/1950 [id number]  Subjective: Telephone call to patient for initial assessment.  Patient reports she is doing ok.  She reports that her sugars are still in the 200 range.  Asked patient about her diet she reports that she eats things like oatmeal, eggs, fruits, and vegetables.  Discussed with patient her diabetes and exercise and how exercise helps to lower blood sugar.  Patient admits she does not exercise on a regular basis.  Encouraged patient to try to incorporate exercise.  She verbalized understanding.    Objective:   Encounter Medications:  Outpatient Encounter Prescriptions as of 06/21/2017  Medication Sig Note  . aspirin EC 81 MG tablet Take 81 mg by mouth daily.   . BD INSULIN SYRINGE ULTRAFINE 31G X 15/64" 0.5 ML MISC USE TO INJECT INSULIN TWO TIMES A DAY   . cilostazol (PLETAL) 100 MG tablet Take 100 mg by mouth 2 (two) times daily. 05/09/2017: Ordered twice a day, taking only as needed  . clopidogrel (PLAVIX) 75 MG tablet TAKE 1 TABLET BY MOUTH EVERY MORNING   . gemfibrozil (LOPID) 600 MG tablet Take 600 mg by mouth 2 (two) times daily before a meal.   . glipiZIDE (GLUCOTROL XL) 10 MG 24 hr tablet Take 1 tablet (10 mg total) by mouth daily with breakfast.   . glucose blood (ACCU-CHEK AVIVA PLUS) test strip Use to check blood sugars twice a day.Dx Code: E11.22.   . hydrochlorothiazide (HYDRODIURIL) 25 MG tablet Take 1 tablet (25 mg total) by mouth daily.   . Insulin Isophane & Regular Human (HUMULIN 70/30 MIX) (70-30) 100 UNIT/ML PEN Inject 36 Units into the skin 2 (two) times daily.   . Insulin Pen Needle (B-D UF III MINI PEN NEEDLES) 31G X 5 MM MISC 1 Dose by Does not apply route 2 (two) times daily. The patient is insulin requiring, ICD 10 code E11.22 .   . levothyroxine (SYNTHROID) 88 MCG tablet Take 1 tablet (88 mcg total) by mouth daily before breakfast.   . losartan (COZAAR) 100  MG tablet Take 1 tablet (100 mg total) by mouth daily.   . ranitidine (ZANTAC) 150 MG tablet TAKE 1 TABLET BY MOUTH EVERY DAY AS NEEDED   . TRADJENTA 5 MG TABS tablet TAKE 1 TABLET BY MOUTH EVERY DAY    No facility-administered encounter medications on file as of 06/21/2017.     Functional Status:  In your present state of health, do you have any difficulty performing the following activities: 06/21/2017 04/25/2017  Hearing? N N  Vision? N N  Comment - -  Difficulty concentrating or making decisions? N N  Walking or climbing stairs? N N  Dressing or bathing? N N  Comment - -  Doing errands, shopping? N N  Preparing Food and eating ? N -  Using the Toilet? N -  In the past six months, have you accidently leaked urine? N -  Do you have problems with loss of bowel control? N -  Managing your Medications? N -  Managing your Finances? N -  Housekeeping or managing your Housekeeping? N -  Some recent data might be hidden    Fall/Depression Screening: Fall Risk  06/21/2017 04/25/2017 03/20/2017  Falls in the past year? No No No   PHQ 2/9 Scores 06/21/2017 04/25/2017 03/20/2017 08/29/2016 08/10/2016 05/01/2016 10/29/2015  PHQ - 2 Score 0 0 0 0 0 0 0  Assessment: Patient will benefit from health coach outreach for disease management and support.    Plan:  [facility] CM Care Plan Problem One     Most Recent Value  Care Plan Problem One  Self-Care Deficit related to diabetes management as evidenced by elevated A1C  Role Documenting the Problem One  [place] for Problem One  Active  THN Long Term Goal   Patient will decrease A1c below 8.5 over the next 90 days.    THN Long Term Goal Start Date  06/21/17  Interventions for Problem One [place] discussed with patient the importance of diet and exercise in controlling blood sugars.      RN Health Coach will provide ongoing education for patient on diabetes through phone calls and sending printed information to patient  for further discussion.  RN Health Coach will sent welcome letter.   RN Health Coach will send initial barriers letter, assessment, and care plan to primary care physician.  RN Health Coach will contact patient in the month of September and patient agrees to next outreach.  Jone Baseman, RN, MSN [place] [phone]

## 2017-06-26 ENCOUNTER — Ambulatory Visit (INDEPENDENT_AMBULATORY_CARE_PROVIDER_SITE_OTHER): Payer: Medicare Other | Admitting: Dietician

## 2017-06-26 DIAGNOSIS — E119 Type 2 diabetes mellitus without complications: Secondary | ICD-10-CM | POA: Diagnosis not present

## 2017-06-26 DIAGNOSIS — N183 Chronic kidney disease, stage 3 unspecified: Secondary | ICD-10-CM

## 2017-06-26 DIAGNOSIS — Z713 Dietary counseling and surveillance: Secondary | ICD-10-CM | POA: Diagnosis not present

## 2017-06-26 DIAGNOSIS — Z794 Long term (current) use of insulin: Secondary | ICD-10-CM

## 2017-06-26 DIAGNOSIS — E1122 Type 2 diabetes mellitus with diabetic chronic kidney disease: Secondary | ICD-10-CM

## 2017-06-26 NOTE — Patient Instructions (Addendum)
Homework-   Make a list of what you like and don't like- what is good to eat!  Write down suggested meals that are good for you!  Stear clear of solid fat like pork, bologna  Eggs are okay up to 7 a week.

## 2017-06-27 NOTE — Progress Notes (Signed)
  Medical Nutrition Therapy:  Appt start time: 1400 end time:  1430. Visit #10   Assessment:  Primary concerns today: glycemic control. [name] reports she is eating healthier and has been helping family members move and is now in the process of moving herself. Her legs have been painful especially at night.  She seems to be thinking about taking different medicines to help her hunger,weight and diabetes. She is not willing to wear the CGM again today. She indicated that believes the leg pain is related to her taking insulin.  Blood sugar: Meter download for past 30 days:average 262 (increased again by ~20 mg/dl), range 169-370, testing 1x/day in am, 93% above 180 mg/dl, all values between 530 and 11 AM Medications- no change, reports taking as directed Activity- moving Food intake: Reports more vegetables and less meat, beans, continues with several slices of bologana or a bologna sandwich at bedtime due to hunger.   Weight- she was happy it was decreased 2 #.  Recommend plant based moderate carb, lower fat to very low fat, high fiber diet for weight loss to help with insulin resistance 1400-1500 calories 165-170 g carbohydrates 85-95 g protein 50 g fat  Progress Towards Goal(s):  No progress.   Nutritional Diagnosis:  NB-1.1 Food and nutrition-related knowledge deficit As related to lack of prior adequate diabetes meal planning is improving very gradually  As evidenced by her report and  food choices .    Intervention:  Nutrition education and counseling about her blood sugar results and options to improve them. Education about medications, food options and activity to improve glycemic control, constipation. Encourage better dietary choices with her new home Coordination of care- none  Teaching Method Utilized: Visual, Auditory,Hands on Handouts given during visit include: AVS Barriers to learning/adherence to lifestyle change: very limited finances Demonstrated degree of  understanding via:  Teach Back   Monitoring/Evaluation:  Dietary intake, exercise, meter, and body weight  2 weeks [place], [place] 06/27/2017 5:02 PM.

## 2017-07-10 ENCOUNTER — Ambulatory Visit: Payer: Medicare Other | Admitting: Dietician

## 2017-07-14 ENCOUNTER — Other Ambulatory Visit: Payer: Self-pay | Admitting: Internal Medicine

## 2017-07-14 DIAGNOSIS — N183 Chronic kidney disease, stage 3 unspecified: Secondary | ICD-10-CM

## 2017-07-14 DIAGNOSIS — E1122 Type 2 diabetes mellitus with diabetic chronic kidney disease: Secondary | ICD-10-CM

## 2017-07-19 ENCOUNTER — Ambulatory Visit (INDEPENDENT_AMBULATORY_CARE_PROVIDER_SITE_OTHER): Payer: Medicare Other | Admitting: Dietician

## 2017-07-19 ENCOUNTER — Other Ambulatory Visit: Payer: Self-pay | Admitting: Internal Medicine

## 2017-07-19 DIAGNOSIS — Z713 Dietary counseling and surveillance: Secondary | ICD-10-CM | POA: Diagnosis not present

## 2017-07-19 DIAGNOSIS — E1122 Type 2 diabetes mellitus with diabetic chronic kidney disease: Secondary | ICD-10-CM

## 2017-07-19 DIAGNOSIS — E119 Type 2 diabetes mellitus without complications: Secondary | ICD-10-CM

## 2017-07-19 DIAGNOSIS — N183 Chronic kidney disease, stage 3 unspecified: Secondary | ICD-10-CM

## 2017-07-19 DIAGNOSIS — Z794 Long term (current) use of insulin: Secondary | ICD-10-CM

## 2017-07-19 MED ORDER — LIRAGLUTIDE 18 MG/3ML ~~LOC~~ SOPN: 0.6000 mg | PEN_INJECTOR | Freq: Every day | SUBCUTANEOUS | 0 refills | Status: DC

## 2017-07-19 NOTE — Progress Notes (Signed)
  Medical Nutrition Therapy:  Appt start time: 8264 end time:  1500. Visit #11 (8 this year)  Assessment:  Primary concerns today: glycemic control and weight loss. [name] kept food records that show some healthier choices- no bologna or hot dogs and many choices high in (saturated) fat( fried chicken, fish and pork chops). She says " I think I need to get off the bread" . She has not moved yet, "I can't move until I have enough food" but has been trying to do as much activity as she can. She denies ned for food assistance. She insists she has to have meat before bed or she awakens with cramps in her legs, She is now using ham before bed. She agrees to trial victoza.  Blood sugar: Reviewed her log: checks one time a day fasting, lowest value was 188, highest 400s. Most in 200 and 300s.  Medications- using 0.9 units /kg,36 units  Humulin 70/30 pens twice a day,  reports taking as directed Activity- ADLs and light walking  Weight-176.5# - no change- "I want to be thin like you."   Recommend plant based moderate carb, lower fat to very low fat, high fiber diet for weight loss to help with insulin resistance 1400-1500 calories 165-170 g carbohydrates 85-95 g protein 50 g fat  Progress Towards Goal(s):  No progress.   Nutritional Diagnosis:  NB-1.1 Food and nutrition-related knowledge deficit As related to lack of prior adequate diabetes meal planning is improving very gradually  As evidenced by her report and  food choices.    Intervention:  Nutrition education and counseling about her blood sugar results and options to improve them. Education about medications, food options and activity to improve glycemic control. Encourage better dietary choices Coordination of care- discussed with Dr. Jari Favre that [name] is ready to take Victoza for a trial period of time. Teaching Method Utilized: Visual, Auditory,Hands on Handouts given during visit include: AVS Barriers to learning/adherence to  lifestyle change: very limited finances Demonstrated degree of understanding via:  Teach Back   Monitoring/Evaluation:  Dietary intake, exercise, meter, and body weight  4 weeks [name], [name], RD 07/19/2017 5:10 PM.

## 2017-07-19 NOTE — Patient Instructions (Addendum)
Have fun making your lists of foods  And trying tofu egg salad  I will call you about the victoza

## 2017-07-19 NOTE — Assessment & Plan Note (Signed)
Patient met with [name] and agreed to start Victoza for better controlled DM. I will see her next week.  Plan: --prescribe victoza 0.43m daily x 1 week then increase to 1.256mdaily.

## 2017-07-20 ENCOUNTER — Telehealth: Payer: Self-pay | Admitting: Dietician

## 2017-07-20 NOTE — Telephone Encounter (Signed)
Left [name] a message about starting victoza, that prescription was sent and to prime each pen only one time.

## 2017-07-25 ENCOUNTER — Encounter: Payer: Self-pay | Admitting: Internal Medicine

## 2017-07-25 ENCOUNTER — Ambulatory Visit (INDEPENDENT_AMBULATORY_CARE_PROVIDER_SITE_OTHER): Payer: Medicare Other | Admitting: Internal Medicine

## 2017-07-25 ENCOUNTER — Ambulatory Visit: Payer: Self-pay

## 2017-07-25 ENCOUNTER — Other Ambulatory Visit (HOSPITAL_COMMUNITY)
Admission: RE | Admit: 2017-07-25 | Discharge: 2017-07-25 | Disposition: A | Discharge status: Home / Self Care | Payer: Medicare Other | Source: Ambulatory Visit | Attending: Internal Medicine | Admitting: Internal Medicine

## 2017-07-25 VITALS — BP 132/56 | HR 80 | Temp 98.2°F | Ht 62.5 in | Wt 174.1 lb

## 2017-07-25 DIAGNOSIS — Z1151 Encounter for screening for human papillomavirus (HPV): Secondary | ICD-10-CM | POA: Diagnosis not present

## 2017-07-25 DIAGNOSIS — Z79899 Other long term (current) drug therapy: Secondary | ICD-10-CM

## 2017-07-25 DIAGNOSIS — Z1279 Encounter for screening for malignant neoplasm of other genitourinary organs: Secondary | ICD-10-CM | POA: Diagnosis not present

## 2017-07-25 DIAGNOSIS — Z87891 Personal history of nicotine dependence: Secondary | ICD-10-CM

## 2017-07-25 DIAGNOSIS — N183 Chronic kidney disease, stage 3 unspecified: Secondary | ICD-10-CM

## 2017-07-25 DIAGNOSIS — Z124 Encounter for screening for malignant neoplasm of cervix: Secondary | ICD-10-CM

## 2017-07-25 DIAGNOSIS — Z0000 Encounter for general adult medical examination without abnormal findings: Secondary | ICD-10-CM

## 2017-07-25 DIAGNOSIS — E1122 Type 2 diabetes mellitus with diabetic chronic kidney disease: Secondary | ICD-10-CM | POA: Diagnosis not present

## 2017-07-25 DIAGNOSIS — K0889 Other specified disorders of teeth and supporting structures: Secondary | ICD-10-CM

## 2017-07-25 DIAGNOSIS — Z794 Long term (current) use of insulin: Secondary | ICD-10-CM | POA: Diagnosis not present

## 2017-07-25 DIAGNOSIS — E1151 Type 2 diabetes mellitus with diabetic peripheral angiopathy without gangrene: Secondary | ICD-10-CM

## 2017-07-25 DIAGNOSIS — I10 Essential (primary) hypertension: Secondary | ICD-10-CM

## 2017-07-25 DIAGNOSIS — D649 Anemia, unspecified: Secondary | ICD-10-CM | POA: Diagnosis not present

## 2017-07-25 DIAGNOSIS — I739 Peripheral vascular disease, unspecified: Secondary | ICD-10-CM | POA: Diagnosis not present

## 2017-07-25 DIAGNOSIS — Z01419 Encounter for gynecological examination (general) (routine) without abnormal findings: Secondary | ICD-10-CM | POA: Diagnosis not present

## 2017-07-25 DIAGNOSIS — Z8673 Personal history of transient ischemic attack (TIA), and cerebral infarction without residual deficits: Secondary | ICD-10-CM | POA: Diagnosis not present

## 2017-07-25 DIAGNOSIS — I771 Stricture of artery: Secondary | ICD-10-CM | POA: Diagnosis not present

## 2017-07-25 DIAGNOSIS — I129 Hypertensive chronic kidney disease with stage 1 through stage 4 chronic kidney disease, or unspecified chronic kidney disease: Secondary | ICD-10-CM | POA: Diagnosis not present

## 2017-07-25 DIAGNOSIS — Z7902 Long term (current) use of antithrombotics/antiplatelets: Secondary | ICD-10-CM

## 2017-07-25 DIAGNOSIS — E785 Hyperlipidemia, unspecified: Secondary | ICD-10-CM

## 2017-07-25 LAB — GLUCOSE, CAPILLARY: Glucose-Capillary: 177 mg/dL — ABNORMAL HIGH (ref 65–99)

## 2017-07-25 LAB — POCT GLYCOSYLATED HEMOGLOBIN (HGB A1C): HEMOGLOBIN A1C: 10.5

## 2017-07-25 NOTE — Patient Instructions (Signed)
Keep working on [employer] and increasing your activity. [name] has a lot of great suggestions!  I'll see you in 3 months for a follow up on your diabetes; please don't hesitate to come sooner if something comes up.

## 2017-07-25 NOTE — Progress Notes (Signed)
   CC: diabetes  HPI:  [name] is a 66 y.o. with a PMH of T2DM, HTN, HLD, PVD, presenting to clinic for f/u on T2DM.  Patient states compliance with Humulin 70/30 36 units BID and tradjenta 5mg  daily. She had considered starting victoza last week so I placed the prescription, however patient changed her mind. She states that she doesn't want any further medications and will continue to work on her diet changes. She has been meeting with [name], our diabetes educator regularly; per note review there has not been much headway on her diabetes control. Patient denies N/V, diarrhea, abd pain, blurry vision, chest pain, shortness of breath.  Further, patient endorses mild gum bleeding after brushing teeth due to poor dentition; she was recommended to have the tooth in question removed, however refuses. She denies other signs and symptoms of bleeding including hematochezia, melena, hematuria, vaginal bleeding and hemoptysis.   Please see problem based Assessment and Plan for status of patients chronic conditions.  Past Medical History:  Diagnosis Date  . Anemia   . Arteriosclerotic cardiovascular disease   . Claudication in peripheral vascular disease ([place])   . CVA (cerebral vascular accident) ([place]) 01/2011   affected rt side  . Diabetes mellitus   . GERD (gastroesophageal reflux disease)   . Hyperlipidemia   . Hypertension   . Hypothyroidism   . Renal insufficiency   . Vitamin D deficiency     Review of Systems:   ROS Per HPI  Physical Exam:  Vitals:   07/25/17 1411 07/25/17 1437  BP: (!) 166/49 (!) 132/56  Pulse: 80   Temp: 98.2 F (36.8 C)   TempSrc: Oral   SpO2: 100%   Weight: 174 lb 1.6 oz (79 kg)   Height: 5' 2.5" (1.588 m)    GENERAL- alert, co-operative, appears as stated age, not in any distress. HEENT- Atraumatic, normocephalic, PERRL, EOMI, oral mucosa appears moist CARDIAC- RRR, no murmurs, rubs or gallops. RESP- Moving equal volumes of air, and clear to  auscultation bilaterally, no wheezes or crackles. ABDOMEN- Soft, nontender, bowel sounds present. GYN: normal genital hair distribution, no external labial lesions; urethra midline; bladder midline and without masses, no adnexal masses or tenderness noted, vaginal vault with scant, non odorous tan discharge; no evidence of cysto or rectocele; cervix without erythema, lesions, bleeding or tenderness.  NEURO- No obvious Cr N abnormality. EXTREMITIES- pulse 2+, symmetric, no pedal edema. SKIN- Warm, dry, no rash or lesion. PSYCH- Normal mood and affect, appropriate thought content and speech.  Assessment & Plan:   See Encounters Tab for problem based charting.   Patient discussed with Dr. Nilsa Nutting, MD Internal Medicine PGY2

## 2017-07-26 ENCOUNTER — Other Ambulatory Visit: Payer: Self-pay

## 2017-07-26 ENCOUNTER — Encounter: Payer: Self-pay | Admitting: Internal Medicine

## 2017-07-26 LAB — BMP8+ANION GAP
ANION GAP: 20 mmol/L — AB (ref 10.0–18.0)
BUN/Creatinine Ratio: 18 (ref 12–28)
BUN: 29 mg/dL — ABNORMAL HIGH (ref 8–27)
CALCIUM: 10 mg/dL (ref 8.7–10.3)
CO2: 23 mmol/L (ref 20–29)
Chloride: 101 mmol/L (ref 96–106)
Creatinine, Ser: 1.65 mg/dL — ABNORMAL HIGH (ref 0.57–1.00)
GFR calc Af Amer: 37 mL/min/{1.73_m2} — ABNORMAL LOW (ref >59)
GFR calc non Af Amer: 32 mL/min/{1.73_m2} — ABNORMAL LOW (ref >59)
Glucose: 180 mg/dL — ABNORMAL HIGH (ref 65–99)
POTASSIUM: 3.9 mmol/L (ref 3.5–5.2)
SODIUM: 144 mmol/L (ref 134–144)

## 2017-07-26 LAB — CBC
HEMATOCRIT: 35.3 % (ref 34.0–46.6)
HEMOGLOBIN: 11.4 g/dL (ref 11.1–15.9)
MCH: 28.3 pg (ref 26.6–33.0)
MCHC: 32.3 g/dL (ref 31.5–35.7)
MCV: 88 fL (ref 79–97)
Platelets: 302 10*3/uL (ref 150–379)
RBC: 4.03 x10E6/uL (ref 3.77–5.28)
RDW: 15.3 % (ref 12.3–15.4)
WBC: 5.4 10*3/uL (ref 3.4–10.8)

## 2017-07-26 NOTE — Assessment & Plan Note (Signed)
CBC today reveals normal Hgb.

## 2017-07-26 NOTE — Assessment & Plan Note (Signed)
Bmet checked today which shows stable CKD stage 3. Will continue to work with patient to maximize HTN and DM management.

## 2017-07-26 NOTE — Patient Outreach (Signed)
[facility]) Care Management  07/26/2017  [name] 1951/03/28 [id number]   Telephone call to patient for monthly call. Someone picked up the call but never said anything after saying hello several times.    Plan: RN Health Coach will attempt patient again in the month of September.  Jone Baseman, RN, MSN [place] [phone]

## 2017-07-26 NOTE — Assessment & Plan Note (Signed)
Patient with h/o CVA in 2004 and 2012 with intermittent dual antiplatelet therapy between the two events for CAD, then continuous DAPT since 2012. Past left heart caths have revealed >80% stenosis of first diagonal branch but otherwise patent coronary arteries; she has never had a stent placed. She has stable intermittent claudication with PAD previously evaluated by vascular surgery who recommended revascularization only if she develops rest pain or vascular ulcers.   In setting of increased risk of bleeding on dual antiplatelets and no observed additional prevention of strokes, worsening CAD or PAD, patient's aspirin will be discontinued and she will be maintained only on plavix.   Patient in agreement with plan, though still worried about risk of recurrent stroke. We discussed the importance of blood pressure and diabetes control which she continues to work on.  Plan: --stop aspirin --continue plavix --continue addressing risk factors

## 2017-07-26 NOTE — Assessment & Plan Note (Signed)
Patient states compliance with Humulin 70/30 36 units BID and tradjenta 5mg  daily. She had considered starting victoza last week so I placed the prescription, however patient changed her mind. She states that she doesn't want any further medications and will continue to work on her diet changes. She has been meeting with [name], our diabetes educator regularly; per note review there has not been much headway on her diabetes control. Patient denies N/V, diarrhea, abd pain, blurry vision, chest pain, shortness of breath.  Per chart review, patient's previous DM medication regimen has been inconsistent; unfortunately not all notes are available to review on Epic. It appears that she has been on Lantus, metformin (biguinide), pioglitazone, rosiglitazone (thiazolidinediones), glimepiride and glipizide (sulfonyluria) in the past. Due to her CKD, she is not a candidate for starting SGLT-2 inhibitors.   A1c today is 10.8, worsening.   Plan: --patient declines medication changes; will continue to address at f/u appointments including adjusting current insulin dosing and possibility of adding Victoza --patient to continue meeting with diabetic educator to work on diet changes --continue Humulin 70/30 36 units qhs --continue linagliptin (tradjenta) 5mg  daily

## 2017-07-26 NOTE — Assessment & Plan Note (Signed)
Pap smear/HPV completed today, will f/u results.

## 2017-07-26 NOTE — Assessment & Plan Note (Signed)
BP well controlled on losartan 100mg  daily and HCTZ 25mg  daily.  Plan: --continue current regimen

## 2017-07-27 LAB — CYTOLOGY - PAP
Diagnosis: NEGATIVE
HPV: NOT DETECTED

## 2017-07-30 NOTE — Progress Notes (Signed)
Internal Medicine Clinic Attending  Case discussed with Dr. Svalina  at the time of the visit.  We reviewed the resident's history and exam and pertinent patient test results.  I agree with the assessment, diagnosis, and plan of care documented in the resident's note.  

## 2017-08-07 ENCOUNTER — Other Ambulatory Visit: Payer: Self-pay

## 2017-08-07 NOTE — Patient Outreach (Signed)
[facility]) Care Management  [place]  08/07/2017   [name] Aug 12, 1951 [id number]  Subjective: Telephone call to patient for monthly call. Patient shares that she recently moved and that so far she likes it. Patient reports that her blood sugars are still up and that she is trying to work on her diet and exercise more.  Patient reports that her sugars are still in the 200's but unable to give an exact reading. Reviewed with patient the importance of monitoring her diet and encouraged more physical activity.  She verbalized understanding and thankful for call.     Objective:   Encounter Medications:  Outpatient Encounter Prescriptions as of 08/07/2017  Medication Sig Note  . B-D UF III MINI PEN NEEDLES 31G X 5 MM MISC USE TO INJECT INSULIN TWICE A DAY   . BD INSULIN SYRINGE ULTRAFINE 31G X 15/64" 0.5 ML MISC USE TO INJECT INSULIN TWO TIMES A DAY   . cilostazol (PLETAL) 100 MG tablet Take 100 mg by mouth 2 (two) times daily. 05/09/2017: Ordered twice a day, taking only as needed  . clopidogrel (PLAVIX) 75 MG tablet TAKE 1 TABLET BY MOUTH EVERY MORNING   . gemfibrozil (LOPID) 600 MG tablet Take 600 mg by mouth 2 (two) times daily before a meal.   . glucose blood (ACCU-CHEK AVIVA PLUS) test strip Use to check blood sugars twice a day.Dx Code: E11.22.   . hydrochlorothiazide (HYDRODIURIL) 25 MG tablet Take 1 tablet (25 mg total) by mouth daily.   . Insulin Isophane & Regular Human (HUMULIN 70/30 MIX) (70-30) 100 UNIT/ML PEN Inject 36 Units into the skin 2 (two) times daily.   Marland Kitchen levothyroxine (SYNTHROID) 88 MCG tablet Take 1 tablet (88 mcg total) by mouth daily before breakfast.   . losartan (COZAAR) 100 MG tablet Take 1 tablet (100 mg total) by mouth daily.   . ranitidine (ZANTAC) 150 MG tablet TAKE 1 TABLET BY MOUTH EVERY DAY AS NEEDED   . TRADJENTA 5 MG TABS tablet TAKE 1 TABLET BY MOUTH EVERY DAY    No facility-administered encounter medications on file as of  08/07/2017.     Functional Status:  In your present state of health, do you have any difficulty performing the following activities: 06/21/2017 04/25/2017  Hearing? N N  Vision? N N  Comment - -  Difficulty concentrating or making decisions? N N  Walking or climbing stairs? N N  Dressing or bathing? N N  Comment - -  Doing errands, shopping? N N  Preparing Food and eating ? N -  Using the Toilet? N -  In the past six months, have you accidently leaked urine? N -  Do you have problems with loss of bowel control? N -  Managing your Medications? N -  Managing your Finances? N -  Housekeeping or managing your Housekeeping? N -  Some recent data might be hidden    Fall/Depression Screening: Fall Risk  08/07/2017 06/21/2017 04/25/2017  Falls in the past year? No No No   PHQ 2/9 Scores 08/07/2017 06/21/2017 04/25/2017 03/20/2017 08/29/2016 08/10/2016 05/01/2016  PHQ - 2 Score 0 0 0 0 0 0 0    Assessment: Patient continues to benefit from health coach outreach for disease management and support.    Plan:  [facility] CM Care Plan Problem One     Most Recent Value  Care Plan Problem One  Self-Care Deficit related to diabetes management as evidenced by elevated A1C  Role Documenting the  Problem One  [place] for Problem One  Active  THN Long Term Goal   Patient will decrease A1c below 8.5 over the next 90 days.    THN Long Term Goal Start Date  08/07/17  Interventions for Problem One [place] reviewed with patient the importance of diet and exercise in controlling blood sugars.      RN Health Coach will contact patient in the month of October and patient agrees to next outreach.  Jone Baseman, RN, MSN [place] [phone]

## 2017-08-08 ENCOUNTER — Other Ambulatory Visit: Payer: Self-pay | Admitting: Internal Medicine

## 2017-08-22 ENCOUNTER — Encounter: Payer: Self-pay | Admitting: Dietician

## 2017-08-22 ENCOUNTER — Ambulatory Visit (INDEPENDENT_AMBULATORY_CARE_PROVIDER_SITE_OTHER): Payer: Medicare Other | Admitting: Dietician

## 2017-08-22 DIAGNOSIS — Z794 Long term (current) use of insulin: Secondary | ICD-10-CM | POA: Diagnosis not present

## 2017-08-22 DIAGNOSIS — N183 Chronic kidney disease, stage 3 unspecified: Secondary | ICD-10-CM

## 2017-08-22 DIAGNOSIS — E1122 Type 2 diabetes mellitus with diabetic chronic kidney disease: Secondary | ICD-10-CM | POA: Diagnosis not present

## 2017-08-22 DIAGNOSIS — Z713 Dietary counseling and surveillance: Secondary | ICD-10-CM | POA: Diagnosis not present

## 2017-08-22 NOTE — Progress Notes (Signed)
  Medical Nutrition Therapy:  Appt start time: 6945 end time:  0388. Visit #12 (9 this year)  Assessment:  Primary concerns today: glycemic control and weight loss. Ms. Tow has moved. She has not had bologna or hot dogs and is trying to eat more [place] sausage, nuts. She is still working on "getting off the bread" .  She reports poor sleep, 3 hours intervals twice at night. She agrees to trial victoza.  Blood sugar: Reviewed her log: checks one time a day fasting, lowest value was 163, highest 343. Most in 200 and 300s.  Medications- using 0.9 units /kg,36 units  Humulin 70/30 pens twice a day, reports injecting in abdomen prior to meals and rotating sites Activity- ADLs, light walking and rides stationary bike  Weight-175.3# - no change- "I want to be thin like you."   Helping her works towards moderate carb, lower saturated fat, high fiber diet for weight loss to help with insulin resistance 1400-1500 calories 165-170 g carbohydrates 85-95 g protein 50 g fat  Progress Towards Goal(s):  No progress.   Nutritional Diagnosis:  NB-1.1 Food and nutrition-related knowledge deficit As related to lack of prior adequate diabetes meal planning is improving very gradually  As evidenced by her report and  food choices.    Intervention:  Nutrition education and counseling about her blood sugar results and options to improve them. Education about victoza, food options and activity to improve glycemic control. Log of activity. Coordination of care- discussed with Dr. Jari Favre that [place] is ready to take Victoza for a trial period of time.   Teaching Method Utilized: Visual, Auditory,Hands on Handouts given during visit include: AVS Barriers to learning/adherence to lifestyle change: very limited finances Demonstrated degree of understanding via:  Teach Back   Monitoring/Evaluation:  Dietary intake, exercise, meter, and body weight  4 weeks Ashla Murph, [name], RD 08/22/2017 3:32 PM.

## 2017-08-22 NOTE — Patient Instructions (Addendum)
Changes to help lower your weight and blood sugar:  1- ride bike daily if you can, write down how many minutes your are on it in your book 2- Decreasing bread intake 2-3 slice a day 3- Regular oatmeal more often, add cinnamon, sugar, dash of salt  Sugar is not a poison- 1 teaspoon at each meal is okay  Per Dr. Harriett Sine can do a trial of Victoza   1- start taking 0.6mg  Victoza every evening one time a day   2- take your morning insulin as usual and Stop your evening dose of insulin   You can start this tonight  See you next week.

## 2017-08-24 ENCOUNTER — Telehealth: Payer: Self-pay | Admitting: Dietician

## 2017-08-24 NOTE — Telephone Encounter (Signed)
Called to follow up on new to liraglutide. [name] says she feels about the same except a little bit nauseated and decreased appetite,"like you said". She is fine with both. Blood sugar was also about the same fasting ~ 200. She agreed to call  If needed.

## 2017-08-29 ENCOUNTER — Ambulatory Visit (INDEPENDENT_AMBULATORY_CARE_PROVIDER_SITE_OTHER): Payer: Medicare Other | Admitting: Dietician

## 2017-08-29 ENCOUNTER — Encounter: Payer: Self-pay | Admitting: Dietician

## 2017-08-29 DIAGNOSIS — Z794 Long term (current) use of insulin: Secondary | ICD-10-CM | POA: Diagnosis not present

## 2017-08-29 DIAGNOSIS — N183 Chronic kidney disease, stage 3 (moderate): Principal | ICD-10-CM

## 2017-08-29 DIAGNOSIS — Z713 Dietary counseling and surveillance: Secondary | ICD-10-CM | POA: Diagnosis not present

## 2017-08-29 DIAGNOSIS — E1122 Type 2 diabetes mellitus with diabetic chronic kidney disease: Secondary | ICD-10-CM

## 2017-08-29 DIAGNOSIS — E119 Type 2 diabetes mellitus without complications: Secondary | ICD-10-CM

## 2017-08-29 LAB — GLUCOSE, CAPILLARY: Glucose-Capillary: 97 mg/dL (ref 65–99)

## 2017-08-29 MED ORDER — LIRAGLUTIDE 18 MG/3ML ~~LOC~~ SOPN: 1.2000 mg | PEN_INJECTOR | Freq: Every day | SUBCUTANEOUS | 3 refills | Status: DC

## 2017-08-29 NOTE — Patient Instructions (Addendum)
Plan for your blood sugar:  Increase Victoza today to 1.2 mg/day in the evening  Continue the am insulin (36 units)   Call for any blood sugars less than 80mg /dl.   Check blood sugars two times a day be fore meals or bedtime  Bring meter so we can compare before and after results  [name] [phone]

## 2017-08-29 NOTE — Addendum Note (Signed)
Addended by: Forde Dandy on: 08/29/2017 01:33 PM   Modules accepted: Orders

## 2017-08-29 NOTE — Progress Notes (Signed)
  Medical Nutrition Therapy:  Appt start time: 6237 end time:  1430. Visit #13 (10 this year)  Assessment:  Primary concerns today: glycemic control and weight loss. Ms. Harshman is happy with the effects of the liraglutide. She has lost 1-2 pounds, is less hungry, feels better and her blood sugars are better.  She agrees to increase the victoza to 1.2 mg. She will consider wearing  the Freestyle libre pro for additional monitoring of blood sugars in the future and agreed to n  increase self testing and call if < 80 mg/dl.   Blood sugar:she forgot her meter today, but says her fasting blood sugar today was 218- the lowest it has been in a while, her blood sugar today 3-5 hours after eating  [place] is 97, the lowest she remembers it being in years.   Medications-diabetes 36 units  Humulin 70/30 in the am, Victoza 0.6 mg in the PM.   Activity- ADLs, light walking and rides stationary bike  Weight-173.7# -decreased ~ 2 #.  1400-1500 calories 165-170 g carbohydrates 85-95 g protein 50 g fat  Progress Towards Goal(s):  No progress.   Nutritional Diagnosis:  NB-1.1 Food and nutrition-related knowledge deficit As related to lack of prior adequate diabetes meal planning is improving very gradually  As evidenced by her report and  food choices.    Intervention:  Nutrition education and counseling about her blood sugar results. Low blood sugar signs & symptoms, prevention and treatment. Encouraged balanced meals with plenty of fruit and vegetables.   Coordination of care- discussed with Dr. Jari Favre, okay to increase victoza to 1.2mg ./day.   Teaching Method Utilized: Visual, Auditory,Hands on Handouts given during visit include: AVS Barriers to learning/adherence to lifestyle change: very limited finances Demonstrated degree of understanding via:  Teach Back   Monitoring/Evaluation:  Dietary intake, exercise, meter, and body weight  2 weeks Plyler, [name], RD 08/29/2017 2:37 PM.

## 2017-08-30 ENCOUNTER — Other Ambulatory Visit: Payer: Self-pay

## 2017-08-30 NOTE — Patient Outreach (Signed)
[facility]) Care Management  08/30/2017  KENNADY ZIMMERLE 1950/12/16 [id number]   Outreach attempt # 1  to patient. No answer. RN Health Coach left HIPAA compliant voicemail message along with contact info.  Plan: RN Health Coach to contact patient within the month of November.  [name] RN, BSN, [place] Direct Dial:  [phone] Fax: [phone]

## 2017-09-13 ENCOUNTER — Ambulatory Visit (INDEPENDENT_AMBULATORY_CARE_PROVIDER_SITE_OTHER): Payer: Medicare Other | Admitting: Dietician

## 2017-09-13 DIAGNOSIS — Z794 Long term (current) use of insulin: Secondary | ICD-10-CM

## 2017-09-13 DIAGNOSIS — N183 Chronic kidney disease, stage 3 unspecified: Secondary | ICD-10-CM

## 2017-09-13 DIAGNOSIS — Z713 Dietary counseling and surveillance: Secondary | ICD-10-CM

## 2017-09-13 DIAGNOSIS — E119 Type 2 diabetes mellitus without complications: Secondary | ICD-10-CM

## 2017-09-13 DIAGNOSIS — E1122 Type 2 diabetes mellitus with diabetic chronic kidney disease: Secondary | ICD-10-CM

## 2017-09-13 LAB — GLUCOSE, CAPILLARY: GLUCOSE-CAPILLARY: 305 mg/dL — AB (ref 65–99)

## 2017-09-13 NOTE — Patient Instructions (Addendum)
Your goals: this time are to increase the Victoza to 5 clicks past the 0.6   decrease your weight by- Eat more vegetables and the fruit is okay in small portions                                             Less fatty foods.                                            Eat fruit sugar as much as possible instead of of added           sugar in soda, etc.

## 2017-09-13 NOTE — Progress Notes (Addendum)
  Medical Nutrition Therapy:  Appt start time: 5176 end time:  1500. Visit #14 (11 this year)  Assessment:  Primary concerns today: glycemic control and weight loss. Ms. Bigbee is happy with the effects of the liraglutide. Unfortunately, she says her hands got numb with the increased dose to 1.2 mg so she decreased it back to the 0.6 mg daily. She doesn't want to wear  the [facility] pr, but will if it will help her. We are currently out of sensors. Suspect she had a low blood sugar and this is what caused her to have uncomfortable symptoms as her CBGs were 127 fasting and 97 in Pm the day before her meter battery died and she went back to the 0.6 mg daily of liraglutide  Blood sugar:her meter clock reset when she changed the battery so last reading was 10/23 and was 127 fasting and 98  Medications-diabetes 36 units  Humulin 70/30 in the am, Victoza 0.6 mg in the PM.   Activity- ADLs, light walking and rides stationary bike  Weight-174# -no change from last visit.  1400-1500 calories 165-170 g carbohydrates 85-95 g protein 50 g fat  Progress Towards Goal(s):  No progress.   Nutritional Diagnosis:  NB-1.1 Food and nutrition-related knowledge deficit As related to lack of prior adequate diabetes meal planning is improving gradul  ly  As evidenced by her report and  food choices.    Intervention:  Nutrition education and counseling about her blood sugar results, liraglutide affects, changes she can make to help with weight loss . Call her when [facility] sensors come in if Dr. Jari Favre approves.  Coordination of care- try to get her to gradually increase dose of victoza safely to 1.2mg /day per  Dr. Jari Favre,   Teaching Method Utilized: Visual, Auditory,Hands on Handouts given during visit include: AVS Barriers to learning/adherence to lifestyle change: very limited finances Demonstrated degree of understanding via:  Teach Back   Monitoring/Evaluation:  Dietary intake, exercise,  meter, and body weight  3 weeks Plyler, [name], RD 09/13/2017 3:26 PM.

## 2017-09-15 ENCOUNTER — Other Ambulatory Visit: Payer: Self-pay | Admitting: Internal Medicine

## 2017-09-15 DIAGNOSIS — I10 Essential (primary) hypertension: Secondary | ICD-10-CM

## 2017-09-22 ENCOUNTER — Other Ambulatory Visit: Payer: Self-pay | Admitting: Student in an Organized Health Care Education/Training Program

## 2017-10-01 ENCOUNTER — Other Ambulatory Visit: Payer: Self-pay

## 2017-10-01 NOTE — Patient Outreach (Signed)
[facility]) Care Management  [place]  10/01/2017   [name] 1951-02-22 [id number]  Subjective: Telephone call to patient for monthly outreach. HIPAA verified with patient. The patient states that she is doing ok.  She checked her blood sugar this morning fasting and it was 265. I dis cussed diet with the patient.  She states that she is on the go all the time and it is hard for her to have a consistent diet.  She eats out a lot and has limited money.  RN Health Coach discussed making better choices at the places she visits.  The patient also states that she does not eat three meals a day. She takes two shots a day of regular insulin.  The patient was advised that taking regular insulin without eating can be very harmful. She verbalized understanding.  The patient was very adamant that she does not want to take any more medications or have her insulin increased. She does state that she wants to lower her insulin and is trying. She has an appointment with her PCP on October 24, 2017. The patient was advised to talk with her physician other options that she has.  Objective:   Encounter Medications:  Outpatient Encounter Medications as of 10/01/2017  Medication Sig Note  . B-D UF III MINI PEN NEEDLES 31G X 5 MM MISC USE TO INJECT INSULIN TWICE A DAY   . BD INSULIN SYRINGE ULTRAFINE 31G X 15/64" 0.5 ML MISC USE TO INJECT INSULIN TWO TIMES A DAY   . cilostazol (PLETAL) 100 MG tablet Take 100 mg by mouth 2 (two) times daily. 05/09/2017: Ordered twice a day, taking only as needed  . clopidogrel (PLAVIX) 75 MG tablet TAKE 1 TABLET BY MOUTH EVERY MORNING   . gemfibrozil (LOPID) 600 MG tablet Take 600 mg by mouth 2 (two) times daily before a meal.   . glucose blood (ACCU-CHEK AVIVA PLUS) test strip Use to check blood sugars twice a day.Dx Code: E11.22.   . hydrochlorothiazide (HYDRODIURIL) 25 MG tablet Take 1 tablet (25 mg total) by mouth daily.   . Insulin Isophane &  Regular Human (HUMULIN 70/30 MIX) (70-30) 100 UNIT/ML PEN Inject 36 Units into the skin 2 (two) times daily.   Marland Kitchen levothyroxine (SYNTHROID, LEVOTHROID) 88 MCG tablet TAKE 1 TABLET BY MOUTH EVERY DAY BEFORE BREAKFAST   . linagliptin (TRADJENTA) 5 MG TABS tablet Take 5 mg daily by mouth.   . liraglutide 18 MG/3ML SOPN Inject 0.2 mLs (1.2 mg total) into the skin daily.   Marland Kitchen losartan (COZAAR) 100 MG tablet TAKE 1 TABLET BY MOUTH EVERY DAY   . ranitidine (ZANTAC) 150 MG tablet TAKE 1 TABLET BY MOUTH EVERY DAY AS NEEDED    No facility-administered encounter medications on file as of 10/01/2017.     Functional Status:  In your present state of health, do you have any difficulty performing the following activities: 06/21/2017 04/25/2017  Hearing? N N  Vision? N N  Difficulty concentrating or making decisions? N N  Walking or climbing stairs? N N  Dressing or bathing? N N  Comment - -  Doing errands, shopping? N N  Preparing Food and eating ? N -  Using the Toilet? N -  In the past six months, have you accidently leaked urine? N -  Do you have problems with loss of bowel control? N -  Managing your Medications? N -  Managing your Finances? N -  Housekeeping or managing your  Housekeeping? N -  Some recent data might be hidden    Fall/Depression Screening: Fall Risk  10/01/2017 08/07/2017 06/21/2017  Falls in the past year? No No No   PHQ 2/9 Scores 08/07/2017 06/21/2017 04/25/2017 03/20/2017 08/29/2016 08/10/2016 05/01/2016  PHQ - 2 Score 0 0 0 0 0 0 0    Assessment:  Patient continues to benefit from health coach outreach for disease management and support.   THN CM Care Plan Problem One     Most Recent Value  Care Plan Problem One  Self-Care Deficit related to diabetes management as evidenced by elevated A1C  Role Documenting the Problem One  [place] for Problem One  Active  THN Long Term Goal   Patient will decrease A1c below 8.5 over the next 90 days.    THN Long Term Goal  Start Date  08/07/17  Interventions for Problem One [place] reinterated with patient the importance of diet and exercise in controlling blood sugars.   THN CM Short Term Goal #1   In 30 days the patient will write down questions for the doctor abbout lowering her blood sugars  THN CM Short Term Goal #1 Start Date  10/01/17  Interventions for Short Term Goal #1  Discussed with the patient the importance of talking with her physician about her blood sugars      Plan: RN Health Coach will contact patient in the month of December and patient agrees to next outreach.

## 2017-10-02 ENCOUNTER — Ambulatory Visit: Payer: Medicare Other | Admitting: Dietician

## 2017-10-03 ENCOUNTER — Encounter: Payer: Self-pay | Admitting: Dietician

## 2017-10-03 ENCOUNTER — Telehealth: Payer: Self-pay | Admitting: Dietician

## 2017-10-03 NOTE — Telephone Encounter (Signed)
Called patient about missed appointment yesterday. She thought it was today, however when I told her we are closed this afternoon, she asked to reschedule to next week.

## 2017-10-09 ENCOUNTER — Encounter: Payer: Self-pay | Admitting: Dietician

## 2017-10-09 ENCOUNTER — Ambulatory Visit (INDEPENDENT_AMBULATORY_CARE_PROVIDER_SITE_OTHER): Payer: Medicare Other | Admitting: Dietician

## 2017-10-09 DIAGNOSIS — Z794 Long term (current) use of insulin: Secondary | ICD-10-CM

## 2017-10-09 DIAGNOSIS — Z713 Dietary counseling and surveillance: Secondary | ICD-10-CM | POA: Diagnosis not present

## 2017-10-09 DIAGNOSIS — N183 Chronic kidney disease, stage 3 unspecified: Secondary | ICD-10-CM

## 2017-10-09 DIAGNOSIS — E119 Type 2 diabetes mellitus without complications: Secondary | ICD-10-CM | POA: Diagnosis not present

## 2017-10-09 DIAGNOSIS — E1122 Type 2 diabetes mellitus with diabetic chronic kidney disease: Secondary | ICD-10-CM

## 2017-10-09 NOTE — Patient Instructions (Signed)
Your blood sugars are better!    dropped from 223 to 208- way to go!   Your weight stayed the same over the holidays! Way to stick to a healthy meal plan!  See you in December at 1:45 PM.

## 2017-10-09 NOTE — Progress Notes (Signed)
  Medical Nutrition Therapy:  Appt start time: 1761 end time:  6073. Visit #14 (11 this year)  Assessment:  Primary concerns today: glycemic control and weight loss. Ms. Carrier is happy with the effects of the liraglutide. She maintained her weight loss over thanksgiving, is less hungry, reports no leg pain, feels better and her blood sugars are better.  She does not want to increase the victoza to 1.2 mg. She checked her blood sugars a few time in the evening as agreed. Appreciated food from the pantry, does not want recipes or meal plan.   Blood sugar:meter download:average is now down to 208 from 223, her range is similar at 126-294. Her Pm readings averaged between 131-204. 25 checks in the am, 6 in the PM, predict A1C will be lower  Medications-diabetes 36 units  Humulin 70/30 in the am, Victoza 0.9 mg in the PM.   Activity- ADLs, light walking, thinking about dancing/zumba  Weight-173.5# -decreased ~ .2 #  Dietary intake:  24 hour recall: oatmeal coffee, 2 eggs, skips lunch or eats fruit, dinner- ham or [place], cabbage, pinto beans & a little mac & cheese  Progress Towards Goal(s):  No progress.   Nutritional Diagnosis:  NB-1.1 Food and nutrition-related knowledge deficit As related to lack of prior adequate diabetes meal planning is improving very gradually  As evidenced by her report and  food choices.    Intervention:  Nutrition education and counseling about her blood sugar and weight results. Encouraged balanced meals with plenty of fruit and vegetables and increasing her activity as much as possible. Asked patient to let us know when she is ready to increase the Victoza, wear the CGM Coordination of care-  none.   Teaching Method Utilized: Visual, Auditory,Hands on Handouts given during visit include: AVS Barriers to learning/adherence to lifestyle change: very limited finances Demonstrated degree of understanding via:  Teach Back   Monitoring/Evaluation:  Dietary intake,  exercise, meter, and body weight  2 weeks Addelyn Alleman, [name], RD 10/09/2017 2:48 PM.

## 2017-10-13 ENCOUNTER — Other Ambulatory Visit: Payer: Self-pay | Admitting: Internal Medicine

## 2017-10-13 DIAGNOSIS — I10 Essential (primary) hypertension: Secondary | ICD-10-CM

## 2017-10-24 ENCOUNTER — Encounter: Payer: Self-pay | Admitting: Internal Medicine

## 2017-10-24 ENCOUNTER — Encounter (INDEPENDENT_AMBULATORY_CARE_PROVIDER_SITE_OTHER): Payer: Self-pay

## 2017-10-24 ENCOUNTER — Ambulatory Visit (INDEPENDENT_AMBULATORY_CARE_PROVIDER_SITE_OTHER): Payer: Medicare Other | Admitting: Dietician

## 2017-10-24 ENCOUNTER — Other Ambulatory Visit: Payer: Self-pay | Admitting: Internal Medicine

## 2017-10-24 ENCOUNTER — Other Ambulatory Visit: Payer: Self-pay

## 2017-10-24 ENCOUNTER — Ambulatory Visit (INDEPENDENT_AMBULATORY_CARE_PROVIDER_SITE_OTHER): Payer: Medicare Other | Admitting: Internal Medicine

## 2017-10-24 ENCOUNTER — Encounter: Payer: Self-pay | Admitting: Dietician

## 2017-10-24 VITALS — Wt 172.7 lb

## 2017-10-24 VITALS — BP 139/64 | HR 70 | Temp 97.9°F | Ht 62.5 in | Wt 172.7 lb

## 2017-10-24 DIAGNOSIS — Z23 Encounter for immunization: Secondary | ICD-10-CM | POA: Diagnosis not present

## 2017-10-24 DIAGNOSIS — E782 Mixed hyperlipidemia: Secondary | ICD-10-CM

## 2017-10-24 DIAGNOSIS — Z8673 Personal history of transient ischemic attack (TIA), and cerebral infarction without residual deficits: Secondary | ICD-10-CM | POA: Diagnosis not present

## 2017-10-24 DIAGNOSIS — E785 Hyperlipidemia, unspecified: Secondary | ICD-10-CM

## 2017-10-24 DIAGNOSIS — Z713 Dietary counseling and surveillance: Secondary | ICD-10-CM

## 2017-10-24 DIAGNOSIS — I10 Essential (primary) hypertension: Secondary | ICD-10-CM

## 2017-10-24 DIAGNOSIS — E1122 Type 2 diabetes mellitus with diabetic chronic kidney disease: Secondary | ICD-10-CM | POA: Diagnosis not present

## 2017-10-24 DIAGNOSIS — I129 Hypertensive chronic kidney disease with stage 1 through stage 4 chronic kidney disease, or unspecified chronic kidney disease: Secondary | ICD-10-CM

## 2017-10-24 DIAGNOSIS — K0510 Chronic gingivitis, plaque induced: Secondary | ICD-10-CM | POA: Diagnosis not present

## 2017-10-24 DIAGNOSIS — Z87891 Personal history of nicotine dependence: Secondary | ICD-10-CM | POA: Diagnosis not present

## 2017-10-24 DIAGNOSIS — N183 Chronic kidney disease, stage 3 unspecified: Secondary | ICD-10-CM

## 2017-10-24 DIAGNOSIS — E039 Hypothyroidism, unspecified: Secondary | ICD-10-CM

## 2017-10-24 DIAGNOSIS — Z79899 Other long term (current) drug therapy: Secondary | ICD-10-CM

## 2017-10-24 DIAGNOSIS — E119 Type 2 diabetes mellitus without complications: Secondary | ICD-10-CM | POA: Diagnosis not present

## 2017-10-24 DIAGNOSIS — E038 Other specified hypothyroidism: Secondary | ICD-10-CM | POA: Diagnosis not present

## 2017-10-24 DIAGNOSIS — E1151 Type 2 diabetes mellitus with diabetic peripheral angiopathy without gangrene: Secondary | ICD-10-CM | POA: Diagnosis not present

## 2017-10-24 DIAGNOSIS — Z0000 Encounter for general adult medical examination without abnormal findings: Secondary | ICD-10-CM

## 2017-10-24 DIAGNOSIS — Z794 Long term (current) use of insulin: Secondary | ICD-10-CM

## 2017-10-24 DIAGNOSIS — Z79890 Hormone replacement therapy (postmenopausal): Secondary | ICD-10-CM

## 2017-10-24 DIAGNOSIS — K0889 Other specified disorders of teeth and supporting structures: Secondary | ICD-10-CM

## 2017-10-24 LAB — GLUCOSE, CAPILLARY: GLUCOSE-CAPILLARY: 203 mg/dL — AB (ref 65–99)

## 2017-10-24 LAB — POCT GLYCOSYLATED HEMOGLOBIN (HGB A1C): Hemoglobin A1C: 9.5

## 2017-10-24 MED ORDER — HYDROCHLOROTHIAZIDE 25 MG PO TABS: 25.0000 mg | ORAL_TABLET | Freq: Every day | ORAL | 3 refills | Status: DC

## 2017-10-24 NOTE — Assessment & Plan Note (Signed)
Well controlled, patient asymptomatic.  Plan: --continue losartan 100mg  daily and HCTZ 25mg  daily

## 2017-10-24 NOTE — Assessment & Plan Note (Signed)
Patient compliant with levothyroxine 12mcg daily; she does endorse about 1 year history of slower growing hair than usual, dry skin (more than usual for this time of year for her) and constipation.   Plan: --check TSH

## 2017-10-24 NOTE — Assessment & Plan Note (Signed)
Ophtho referral placed. PCV 13 given today Declined flu vaccination Foot exam performed today Will need screening Dexa in the future.

## 2017-10-24 NOTE — Assessment & Plan Note (Signed)
Patient endorses compliance with gemfibrozil. She reports prior reactions to statins (sick to her stomach) and she declines changing to statin therapy at this time.

## 2017-10-24 NOTE — Progress Notes (Signed)
   CC: diabetes  HPI:  [name] is a 66 y.o. 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  T2DM: Last A1c was 10.5; since then, patient has been regularly meeting with our diabetes educator; in coordination with DE patient had change in her DM therapy to humulin 70/30 36 units qAM and Victoza 0.9mg  qPM  - patient has been very hesitant with any change to her medicines even increase in insulin dosing so this was a big step. Per DE notes, she has been able to adjust her diet slightly to help with her DM management but is limited by finances and ability to get groceries only monthly.   Tooth pain:  Patient endorses progressive tooth pain since her dental visit 6 months ago which resulted in a pulled tooth. She states this makes it difficult to chew due to pain. She denies fevers, chills, lymphadenopathy; she has no further gum bleeding. She feels she might have an abscess.   Hypothyroidism: Patient compliant with levothyroxine 61mcg daily; she does endorse about 1 year history of slower growing hair than usual, dry skin (more than usual for this time of year for her) and constipation.   Furthermore, patient denies chest pain, shortness of breath, headaches, focal weakness or numbness.   Please see problem based Assessment and Plan for status of patients chronic conditions.  Past Medical History:  Diagnosis Date  . Anemia   . Arteriosclerotic cardiovascular disease   . Claudication in peripheral vascular disease ([place])   . CVA (cerebral vascular accident) ([place]) 01/2011   affected rt side  . Diabetes mellitus 2000  . GERD (gastroesophageal reflux disease)   . Hyperlipidemia   . Hypertension   . Hypothyroidism   . Renal insufficiency   . Vitamin D deficiency     Review of Systems:   ROS Per HPI  Physical Exam:  Vitals:   10/24/17 1410 10/24/17 1443    BP: (!) 145/57 139/64  Pulse: 72 70  Temp: 97.9 F (36.6 C)   TempSrc: Oral   SpO2: 98%   Weight: 172 lb 11.2 oz (78.3 kg)   Height: 5' 2.5" (1.588 m)    GENERAL- alert, co-operative, appears as stated age, not in any distress. HEENT- Atraumatic, normocephalic, EOMI, oral mucosa appears moist; gingivitis along right sided teeth (mandibular and maxillary), general poor dentition, no clear abscess, no lymphadenopathy CARDIAC- RRR, no murmurs, rubs or gallops. RESP- Moving equal volumes of air, and clear to auscultation bilaterally, no wheezes or crackles. ABDOMEN- Soft, nontender, bowel sounds present. NEURO- No obvious Cr N abnormality. EXTREMITIES- pulse 1+ bil PT/TP, 2+ bil radial, symmetric, no pedal edema. SKIN- Warm, dry, no rash or lesion. PSYCH- Normal mood and affect, appropriate thought content and speech.  Assessment & Plan:   See Encounters Tab for problem based charting.   Patient discussed with Dr. Moshe Cipro, MD Internal Medicine PGY2

## 2017-10-24 NOTE — Assessment & Plan Note (Signed)
Patient endorses progressive tooth pain since her dental visit 6 months ago which resulted in a pulled tooth. She states this makes it difficult to chew due to pain. She denies fevers, chills, lymphadenopathy; she has no further gum bleeding. She feels she might have an abscess.   Exam reveals gingivitis, no clear abscess, general poor dentition; she has no lymphadenopathy and no fever to warrant imaging at this time.  Plan: --advised patient f/u with dentist for likely tooth extraction

## 2017-10-24 NOTE — Progress Notes (Signed)
Internal Medicine Clinic Attending  Case discussed with Dr. Svalina  at the time of the visit.  We reviewed the resident's history and exam and pertinent patient test results.  I agree with the assessment, diagnosis, and plan of care documented in the resident's note.  Alexander N Raines, MD   

## 2017-10-24 NOTE — Patient Instructions (Signed)
Please go see your dentist as soon as possible to have your teeth evaluated and managed.  For your diabetes, go ahead and increase you victoza dose to 1mg  at night, and continue your insulin at 36 units every morning.   I placed a referral for your eye exam at Dr. Zenia Resides office; you will be called about an appointment in the next week or so; if not, call our clinic.

## 2017-10-24 NOTE — Patient Instructions (Signed)
To lower your A1C by March 2019  1- eat more eggs and oatmeal instead of sausage and bacon 2- Try to increase Victoza to 7.91 ( 4 clicks past the 0.6)  3- You like to dance..... ?????? That is great activity. ? Zumba?  Merry Christmas and happy new year!

## 2017-10-24 NOTE — Assessment & Plan Note (Signed)
Last A1c was 10.5; since then, patient has been regularly meeting with our diabetes educator; in coordination with DE patient had change in her DM therapy to humulin 70/30 36 units qAM and Victoza 0.9mg  qPM  - patient has been very hesitant with any change to her medicines even increase in insulin dosing so this was a big step. Per DE notes, she has been able to adjust her diet slightly to help with her DM management but is limited by finances and ability to get groceries only monthly.   Plan: --A1c - 9.5 today, improved though still uncontrolled --referral to ophtho placed for screening eye exam  --foot exam performed today --PCV 13 given today; patient declined flu vaccination --continue Humulin 36 units qAM and increase to Victoza 1mg  qPM --f/u with DE in 23month --f/u with me in 3 months

## 2017-10-24 NOTE — Progress Notes (Signed)
  Medical Nutrition Therapy:  Appt start time: 1320 end time:  1405. Visit #15 (12 this year)  Assessment:  Primary concerns today: glycemic control and weight loss. Ms. Gully is still doing well with liraglutide. Her weight is gradully decreasing about 1/2 pound per week.  She does not want to increase the victoza to 1.2 mg, but agrees to try to increase it to 0.10 mg daily.  She checked her blood sugars a few times in the evening. Appreciated food from the pantry, has stopped eating bologna and many red meats and is still trying to cut out sausage.  Teeth- reports 4 bottom teeth are abscessed where she feels blood/pus coming out  Eyes- due for eye exam- prefers Dr. Zenia Resides office Feet- reports checking daily and no problems.   Blood sugar:meter download:average is 221 x 30 days, her range is similar at 126-294. Her Pm readings 177/266/290/302, mostly low 200s in the mornings A1C 1% lower than in September- now 9.5%  Medications-diabetes 36 units  Humulin 70/30 in the am, Victoza 0.9 mg in the PM, takes centrum silver one time a week  Activity- ADLs, light walking, thinking about dancing/zumba  Weight-172.7# - a decreasing trend  Dietary intake:  24 hour recall: sausage, coffee, 1 egg, skips lunch or eats fruit like pears she had yesteraday, dinner- boiled chicken  Progress Towards Goal(s):  Some progress.   Nutritional Diagnosis:  NB-1.1 Food and nutrition-related knowledge deficit As related to lack of prior adequate diabetes meal planning is improving gradually  As evidenced by her report and  food choices. Lack of material resources is a constant barrier.     Intervention:  Nutrition education and counseling about her blood sugar and weight results. Encouraged balanced meals with plenty of fruit, vegetables, whole grains and increasing her activity as much as possible. Coordination of care-  Recommend ophthalmology referral to Dr. Zenia Resides office  Teaching Method Utilized: Visual,  Auditory,Hands on Handouts given during visit include: AVS Barriers to learning/adherence to lifestyle change: very limited finances Demonstrated degree of understanding via:  Teach Back   Monitoring/Evaluation:  Dietary intake, exercise, meter, and body weight  4 weeks Keyah Blizard, [name], RD 10/24/2017 2:55 PM.

## 2017-10-25 LAB — TSH: TSH: 0.682 u[IU]/mL (ref 0.450–4.500)

## 2017-10-31 ENCOUNTER — Other Ambulatory Visit: Payer: Self-pay

## 2017-10-31 NOTE — Patient Outreach (Signed)
[place] [facility]) Care Management  [place]  10/31/2017   [name] [birth year] [id number]  Subjective: Telephone called placed to the patient for monthly assessment. HIPAA verified. The patient states that she is doing ok. She is having a hard time eating because of her teeth.  She states that she has an appointment set up with a dentist.  She denies any pain or falls.  She states that her blood sugar this morning was 190. She had an appointment with the physician on the 12 th of this month.  Victoza has been added to her regimen.  She states that she is adherent with her medications.  She is trying to eat the right foods and she is exercising on her bike, stairs and walking as much as she can.  Objective:   Encounter Medications:  Outpatient Encounter Medications as of 10/31/2017  Medication Sig Note  . B-D UF III MINI PEN NEEDLES 31G X 5 MM MISC USE TO INJECT INSULIN TWICE A DAY   . BD INSULIN SYRINGE ULTRAFINE 31G X 15/64" 0.5 ML MISC USE TO INJECT INSULIN TWO TIMES A DAY   . cilostazol (PLETAL) 100 MG tablet Take 100 mg by mouth 2 (two) times daily. 05/09/2017: Ordered twice a day, taking only as needed  . clopidogrel (PLAVIX) 75 MG tablet TAKE 1 TABLET BY MOUTH EVERY MORNING   . gemfibrozil (LOPID) 600 MG tablet Take 600 mg by mouth 2 (two) times daily before a meal.   . glucose blood (ACCU-CHEK AVIVA PLUS) test strip Use to check blood sugars twice a day.Dx Code: E11.22.   . hydrochlorothiazide (HYDRODIURIL) 25 MG tablet Take 1 tablet (25 mg total) by mouth daily.   . Insulin Isophane & Regular Human (HUMULIN 70/30 MIX) (70-30) 100 UNIT/ML PEN Inject 36 Units into the skin 2 (two) times daily. 10/31/2017: Patient is only taking 1 time a day  in the morning.   Marland Kitchen levothyroxine (SYNTHROID, LEVOTHROID) 88 MCG tablet TAKE 1 TABLET BY MOUTH EVERY DAY BEFORE BREAKFAST   . liraglutide 18 MG/3ML SOPN Inject 0.2 mLs (1.2 mg total) into the skin daily.   Marland Kitchen losartan  (COZAAR) 100 MG tablet TAKE 1 TABLET BY MOUTH EVERY DAY   . ranitidine (ZANTAC) 150 MG tablet TAKE 1 TABLET BY MOUTH EVERY DAY AS NEEDED    No facility-administered encounter medications on file as of 10/31/2017.     Functional Status:  In your present state of health, do you have any difficulty performing the following activities: 10/24/2017 06/21/2017  Hearing? N N  Vision? N N  Difficulty concentrating or making decisions? N N  Walking or climbing stairs? N N  Dressing or bathing? N N  Comment - -  Doing errands, shopping? N N  Preparing Food and eating ? - N  Using the Toilet? - N  In the past six months, have you accidently leaked urine? - N  Do you have problems with loss of bowel control? - N  Managing your Medications? - N  Managing your Finances? - N  Housekeeping or managing your Housekeeping? - N  Some recent data might be hidden    Fall/Depression Screening: Fall Risk  10/31/2017 10/24/2017 10/01/2017  Falls in the past year? No No No   PHQ 2/9 Scores 10/24/2017 08/07/2017 06/21/2017 04/25/2017 03/20/2017 08/29/2016 08/10/2016  PHQ - 2 Score 0 0 0 0 0 0 0    Assessment: Patient continues to benefit from health coach outreach for disease management and  support.   THN CM Care Plan Problem One     Most Recent Value  Care Plan Problem One  Self-Care Deficit related to diabetes management as evidenced by elevated A1C  Role Documenting the Problem One  [place] for Problem One  Active  THN Long Term Goal   Patient will decrease A1c below 8.5 over the next 90 days.    THN Long Term Goal Start Date  08/07/17  Interventions for Problem One [place] reenforced with patient the importance  of diet, hydration and exercise with the patient.  THN CM Short Term Goal #1   In 30 days the patient will write down questions for the doctor abbout lowering her blood sugars  THN CM Short Term Goal #1 Start Date  10/01/17  [facility] [place] CM Short Term Goal #1 Met  Date  10/31/17  Interventions for Short Term Goal #1  Discussed with the patient the importance of talking with her physician about her blood sugars     Plan: [place] will contact patient in the month of January and patient agrees to next outreach.

## 2017-11-16 ENCOUNTER — Other Ambulatory Visit: Payer: Self-pay | Admitting: Oncology

## 2017-11-16 DIAGNOSIS — Z1231 Encounter for screening mammogram for malignant neoplasm of breast: Secondary | ICD-10-CM

## 2017-11-22 ENCOUNTER — Encounter: Payer: Self-pay | Admitting: Dietician

## 2017-11-22 ENCOUNTER — Ambulatory Visit (INDEPENDENT_AMBULATORY_CARE_PROVIDER_SITE_OTHER): Payer: Medicare HMO | Admitting: Dietician

## 2017-11-22 ENCOUNTER — Other Ambulatory Visit: Payer: Self-pay | Admitting: Dietician

## 2017-11-22 DIAGNOSIS — N183 Chronic kidney disease, stage 3 unspecified: Secondary | ICD-10-CM

## 2017-11-22 DIAGNOSIS — E1122 Type 2 diabetes mellitus with diabetic chronic kidney disease: Secondary | ICD-10-CM

## 2017-11-22 DIAGNOSIS — Z794 Long term (current) use of insulin: Secondary | ICD-10-CM

## 2017-11-22 NOTE — Progress Notes (Signed)
Request your signature on referral for 2019 for MNT and DSMT. It is in this note. Thank you!

## 2017-11-22 NOTE — Progress Notes (Signed)
  Medical Nutrition Therapy:  Appt start time: 8466 end time:  5993. Visit #16 (1 this year)  Assessment:  Primary concerns today: glycemic control and weight loss. [name] is still doing well with liraglutide. Her weight is gradully decreasing about 1/2-1 pound per week.  She does not want to increase the victoza from 0.9 mg daily.  She checked her blood sugars more often in the evening. Several readings in low 100s and she says these are usually following times of increased activity. Appreciated food from the pantry.  Teeth- improved  Eyes- reminded her of her exam 11/27/17- next week at Dr. Zenia Resides office Feet- no problems   Lipids from 2017 when A1C was 10.4% -    total chol -200                                    LDL -99               HDL-38                                          TG- 337  Blood sugar:meter download:average is 217 x 30 days ( decreased 4 mg/dl from last month), her range is similar at 98-319.  A1C - due again in March 9.5%  Medications-diabetes 36 units  Humulin 70/30 in the am, Victoza 0.9 mg in the PM, centrum silver one time a week  Activity- ADLs, light walking, marching and stationary bicycling  Weight-169.5# a decrease of 3 more pounds continues with gradual intentional decreasing trend  Dietary intake:  24 hour recall: stopped sausage, bologna, steak, pork chops, did not eat today, had coffee,  dinner- chicken salad or tuna salad  Progress Towards Goal(s):  Some progress.   Nutritional Diagnosis:  NB-1.1 Food and nutrition-related knowledge deficit As related to lack of prior adequate diabetes meal planning is improving gradually As evidenced by her report and  food choices. Lack of material resources is a constant barrier.     Intervention:  Nutrition education and counseling about her blood sugar and weight results. Encouraged balanced meals with plenty of fruit, vegetables, whole grains, decreased saturated fat and increasing her activity as much as  possible. Coordination of care-  none  Teaching Method Utilized: Visual, Auditory,Hands on Handouts given during visit include: AVS Barriers to learning/adherence to lifestyle change: very limited finances Demonstrated degree of understanding via:  Teach Back   Monitoring/Evaluation:  Dietary intake, exercise, meter, and body weight  4 weeks [name], [name], RD 11/22/2017 4:14 PM.

## 2017-11-22 NOTE — Progress Notes (Signed)
Order signed. Thank you! - Estill Dooms

## 2017-11-22 NOTE — Patient Instructions (Addendum)
Stop up front to make and appointment with Dr. Jari Favre for March.  Your blood sugars look decreased from last visit 221 average to 217 average this visit.   [name] [phone]

## 2017-11-27 DIAGNOSIS — E119 Type 2 diabetes mellitus without complications: Secondary | ICD-10-CM | POA: Diagnosis not present

## 2017-11-27 DIAGNOSIS — H25813 Combined forms of age-related cataract, bilateral: Secondary | ICD-10-CM | POA: Diagnosis not present

## 2017-11-27 LAB — HM DIABETES EYE EXAM

## 2017-11-30 ENCOUNTER — Telehealth: Payer: Self-pay | Admitting: Pharmacist

## 2017-11-30 NOTE — Progress Notes (Signed)
Patient called asking if any medications could cause strong urine smell. Reviewed medications with pharmacy student and found no discrepancy. Patient does have CKD, patient states she has been taking gemfibrozil once daily due to nausea. PCP appointment scheduled for 02/06/18. Patient was advised to schedule in [facility] if symptoms worsen/persist. Patient verbalized understanding.

## 2017-12-03 ENCOUNTER — Encounter: Payer: Self-pay | Admitting: *Deleted

## 2017-12-03 ENCOUNTER — Other Ambulatory Visit: Payer: Self-pay

## 2017-12-03 NOTE — Patient Outreach (Signed)
[place] [facility]) Care Management  12/03/2017  [name] 12-Oct-1951 [id number]   Telephone call to patient for monthly outreach.  No answer. HIPAA compliant voice message left with contact information.  Plan:  RN Health Coach will make an outreach attempt to the patient in the month of January.  [name] RN, BSN, [place] Direct Dial:  [phone] Fax: [phone]

## 2017-12-07 ENCOUNTER — Telehealth: Payer: Self-pay | Admitting: Dietician

## 2017-12-07 ENCOUNTER — Other Ambulatory Visit: Payer: Self-pay

## 2017-12-07 ENCOUNTER — Telehealth: Payer: Self-pay | Admitting: Internal Medicine

## 2017-12-07 ENCOUNTER — Other Ambulatory Visit: Payer: Self-pay | Admitting: Pharmacist

## 2017-12-07 DIAGNOSIS — E1122 Type 2 diabetes mellitus with diabetic chronic kidney disease: Secondary | ICD-10-CM

## 2017-12-07 DIAGNOSIS — N183 Chronic kidney disease, stage 3 (moderate): Principal | ICD-10-CM

## 2017-12-07 MED ORDER — INSULIN ISOPHANE & REGULAR (HUMAN 70-30)100 UNIT/ML KWIKPEN: 36.0000 [IU] | PEN_INJECTOR | Freq: Every day | SUBCUTANEOUS | 8 refills | Status: DC

## 2017-12-07 NOTE — Telephone Encounter (Signed)
Sending to pcp, dr Jari Favre, dr Maudie Mercury and donnap Tried to rtc to [place]ene, no answer

## 2017-12-07 NOTE — Telephone Encounter (Signed)
Called patient and left her a message with the information from pharmacy. I suggested if she is still having problems with her urine that she can call the office and speak with a triage nurse or schedule an appointment to see a doctor.

## 2017-12-07 NOTE — Patient Outreach (Signed)
[place] [facility]) Care Management  [place]  12/07/2017   [name] [birth year] [id number]  Subjective: Received return call from the patient for monthly assessment. HIPAA verified.  The patient stated that she has been stressed.  She changed insurances and her current insurance will not cover her humulin 70/30.  The patient states that she does not want to switch her insulin. RN Health Coach put in a pharmacy referral to see if they can assist her with this problem.  The patient denies any falls or pain.  She states that her blood sugar this morning was 250.  She feels that it is due to stress.  The patient had an appointment with her nutritionist on 11/22/2017.  The patient states she has been eating healthier and doing exercise.  Her blood sugars  had been running in the 100's until today.  She has been adherent with her medications. She states that she was changed to Tradjenta 5 mg once daily.  The patient stated that she would like to try boost.  [place] sent her coupons.  Objective:   Encounter Medications:  Outpatient Encounter Medications as of 12/07/2017  Medication Sig Note  . B-D UF III MINI PEN NEEDLES 31G X 5 MM MISC USE TO INJECT INSULIN TWICE A DAY   . BD INSULIN SYRINGE ULTRAFINE 31G X 15/64" 0.5 ML MISC USE TO INJECT INSULIN TWO TIMES A DAY   . cilostazol (PLETAL) 100 MG tablet Take 100 mg by mouth 2 (two) times daily. 05/09/2017: Ordered twice a day, taking only as needed  . clopidogrel (PLAVIX) 75 MG tablet TAKE 1 TABLET BY MOUTH EVERY MORNING   . gemfibrozil (LOPID) 600 MG tablet Take 600 mg by mouth 2 (two) times daily before a meal.   . glucose blood (ACCU-CHEK AVIVA PLUS) test strip Use to check blood sugars twice a day.Dx Code: E11.22.   . hydrochlorothiazide (HYDRODIURIL) 25 MG tablet Take 1 tablet (25 mg total) by mouth daily.   . Insulin Isophane & Regular Human (HUMULIN 70/30 MIX) (70-30) 100 UNIT/ML PEN Inject 36 Units into the skin 2  (two) times daily. 10/31/2017: Patient is only taking 1 time a day  in the morning.   Marland Kitchen levothyroxine (SYNTHROID, LEVOTHROID) 88 MCG tablet TAKE 1 TABLET BY MOUTH EVERY DAY BEFORE BREAKFAST   . linagliptin (TRADJENTA) 5 MG TABS tablet Take 5 mg by mouth daily.   Marland Kitchen liraglutide 18 MG/3ML SOPN Inject 0.2 mLs (1.2 mg total) into the skin daily.   Marland Kitchen losartan (COZAAR) 100 MG tablet TAKE 1 TABLET BY MOUTH EVERY DAY   . ranitidine (ZANTAC) 150 MG tablet TAKE 1 TABLET BY MOUTH EVERY DAY AS NEEDED    No facility-administered encounter medications on file as of 12/07/2017.     Functional Status:  In your present state of health, do you have any difficulty performing the following activities: 10/24/2017 06/21/2017  Hearing? N N  Vision? N N  Difficulty concentrating or making decisions? N N  Walking or climbing stairs? N N  Dressing or bathing? N N  Comment - -  Doing errands, shopping? N N  Preparing Food and eating ? - N  Using the Toilet? - N  In the past six months, have you accidently leaked urine? - N  Do you have problems with loss of bowel control? - N  Managing your Medications? - N  Managing your Finances? - N  Housekeeping or managing your Housekeeping? - N  Some  recent data might be hidden    Fall/Depression Screening: Fall Risk  12/07/2017 10/31/2017 10/24/2017  Falls in the past year? No No No   PHQ 2/9 Scores 10/24/2017 08/07/2017 06/21/2017 04/25/2017 03/20/2017 08/29/2016 08/10/2016  PHQ - 2 Score 0 0 0 0 0 0 0    Assessment: Patient continues to benefit from health coach outreach for disease management and support.   THN CM Care Plan Problem One     Most Recent Value  Care Plan Problem One  Self-Care Deficit related to diabetes management as evidenced by elevated A1C  Role Documenting the Problem One  [place] for Problem One  Active  THN Long Term Goal   Patient will decrease A1c below 8.5 over the next 90 days.    THN Long Term Goal Start Date  12/07/17   Interventions for Problem One [place] talked with patient the importance  of diet, hydration and exercise with the patient.  THN CM Short Term Goal #1   In 30 days the patient will write down questions for the doctor abbout lowering her blood sugars  THN CM Short Term Goal #1 Start Date  10/01/17  [facility] CM Short Term Goal #1 Met Date  10/31/17  Interventions for Short Term Goal #1  Discussed with the patient the importance of talking with her physician about her blood sugars      Plan:  [place] will contact patient in the month of February and patient agrees to next outreach.  [name] RN, BSN, [place] Direct Dial:  [phone] Fax: [phone]

## 2017-12-07 NOTE — Telephone Encounter (Signed)
-----   Message from Forde Dandy, PharmD sent at 11/28/2017  2:31 PM EST ----- Regarding: RE: urine smells strong and is clear Hi Marcelene [place] and I both reviewed the case but did not see any medications that would cause odd urine smell.  ----- Message ----- From: Plyler, Chauncey Reading, RD Sent: 11/22/2017   4:12 PM To: Forde Dandy, PharmD Subject: urine smells strong and is clear               She'd like to know if this is coming from/a usual side effect from one of her medications.    Thank you!  [name]

## 2017-12-07 NOTE — Patient Outreach (Signed)
[place] [facility]) Care Management  12/07/2017  [name] 19-Oct-1951 [id number]   2nd attempt to make an outreach to the patient for monthly assessment. No answer. HIPAA compliant voice message left with contact information.  Plan:  RN Health Coach will make an outreach attempt to the patient in the month February.  [name] RN, BSN, [place] Direct Dial:  [phone] Fax: [phone]

## 2017-12-07 NOTE — Patient Outreach (Signed)
[place] [facility]) Care Management  12/07/2017  [name] 24-Jan-1951 [id number]  67 year old female being followed by [facility] for diabetes management.  [place] services requested for medication assistance with insulin.  PMHx includes, but not limited to, diabetes mellitus type 2, chronic kidney disease stage III, HTN, HLD, PVD, GERD, CVA, and tobacco abuse.    Insurance: Dual Medicaid / [employer] [phone]  Successful outreach call to Ms. Bier. HIPAA identifiers verified. Ms. Infantino is agreeable to pharmacy services.  She reports that she is currently taking Humulin 70/30 pens.  She states that she was previously using Novolin but was changed to Humulin several years ago.  She reports that she was told earlier this month by her pharmacy that [facility] would only cover 1 month supply of Humulin and that she needed to change to a different insulin for it to be covered.  She currently has medication for a few weeks.  She reports that her provider is Dr. Jari Favre and [facility] preferred [facility] [address].  She does not wish to review her medications as she states she discussed them earlier today with [facility] RN [name].    A/P [facility] formulary prefers Novolin 70/30 flex pens rather than Humulin 70/30.  Provider will need to send a new prescription for Novolin 70/30 to pharmacy.  I reviewed this with patient and she is agreeable to use Novolin again.    I will contact Dr. Jari Favre to request new prescription and will alert patient when prescription is sent to pharmacy.   Ralene Bathe, PharmD, [place] [phone]

## 2017-12-07 NOTE — Telephone Encounter (Signed)
Prescription changed from Humulin to Novolin 70/30 mix, 36 units daily.  [name], MD IMTS - PGY2

## 2017-12-07 NOTE — Telephone Encounter (Signed)
Shade Flood please call back at [phone], patient has new insurance and doesn't cover insulin and she needs a new prescription

## 2017-12-10 ENCOUNTER — Ambulatory Visit: Payer: Self-pay | Admitting: Pharmacist

## 2017-12-10 ENCOUNTER — Other Ambulatory Visit: Payer: Self-pay | Admitting: Pharmacist

## 2017-12-10 NOTE — Patient Outreach (Signed)
[facility]) Care Management  12/10/2017  [name] Aug 29, 1951 [id number]  Inbasket message from Dr. Jari Favre confirming prescription for Novolin 70/30 sent to pharmacy today.   [facility] coordination [facility]  Pharmacist confirmed that Novolin prescription received and will be run through insurance tomorrow 12/11/2017.  Humulin prescription deactivated.   Per review of [employer] formulary, Lady Gary is still covered in 2019 therefore no change necessary to this medication.    Unsuccessful outreach call to [name].  I left a HIPAA compliant voicemail on her home phone.   Plan: I will reach out to [name] tomorrow to update her on her insulin.   Ralene Bathe, PharmD, [place] [phone]

## 2017-12-11 ENCOUNTER — Other Ambulatory Visit: Payer: Self-pay | Admitting: Pharmacist

## 2017-12-11 ENCOUNTER — Encounter: Payer: Self-pay | Admitting: *Deleted

## 2017-12-11 ENCOUNTER — Ambulatory Visit: Payer: Self-pay | Admitting: Pharmacist

## 2017-12-11 NOTE — Patient Outreach (Signed)
[place]) Care Management  12/11/2017  HONESTII MARTON 24-Aug-1951 [id number]  Incoming call received from Ms. Peplinski. HIPAA identifiers verified. I reviewed with Ms. Angelini that new prescription for Novolin 70/30 insulin is ready for pick-up at her pharmacy.  Ms. Ehresman expressed understanding.  She has no further medication questions or concerns at this time.   [facility] pharmacy will sign-off patient case. I am happy to assist in the future as needed.  I will alert [facility] RN [name] of case closure.   Ralene Bathe, PharmD, [place] [phone]

## 2017-12-12 ENCOUNTER — Ambulatory Visit
Admission: RE | Admit: 2017-12-12 | Discharge: 2017-12-12 | Disposition: A | Discharge status: Home / Self Care | Payer: Medicare HMO | Source: Ambulatory Visit | Attending: Oncology | Admitting: Oncology

## 2017-12-12 DIAGNOSIS — Z1231 Encounter for screening mammogram for malignant neoplasm of breast: Secondary | ICD-10-CM | POA: Diagnosis not present

## 2017-12-13 ENCOUNTER — Encounter: Payer: Self-pay | Admitting: Pharmacist

## 2017-12-25 ENCOUNTER — Ambulatory Visit (INDEPENDENT_AMBULATORY_CARE_PROVIDER_SITE_OTHER): Payer: Medicare HMO | Admitting: Dietician

## 2017-12-25 DIAGNOSIS — Z794 Long term (current) use of insulin: Secondary | ICD-10-CM

## 2017-12-25 DIAGNOSIS — Z713 Dietary counseling and surveillance: Secondary | ICD-10-CM | POA: Diagnosis not present

## 2017-12-25 DIAGNOSIS — N183 Chronic kidney disease, stage 3 unspecified: Secondary | ICD-10-CM

## 2017-12-25 DIAGNOSIS — Z6830 Body mass index (BMI) 30.0-30.9, adult: Secondary | ICD-10-CM

## 2017-12-25 DIAGNOSIS — E1122 Type 2 diabetes mellitus with diabetic chronic kidney disease: Secondary | ICD-10-CM | POA: Diagnosis not present

## 2017-12-25 NOTE — Patient Instructions (Addendum)
[name] a DDS Address: [address], [place], [place] [address] Phone: [phone]  For your new shoes- suggest longer and larger toebox- square on the end so her toes are not squeezed   My  goal is to go to the [place] senior center one time before March 12th.   I___________________________________________________commit to going to the [facility] senior center before March 12th.

## 2017-12-25 NOTE — Progress Notes (Signed)
  Medical Nutrition Therapy:  Appt start time: 1350 end time:  5852. Visit #17 (2 this year)  Assessment:  Primary concerns today: glycemic control and weight loss. Ms. Jupin is still doing well with liraglutide. Her weight is gradully decreasing about 1/4 pound per week.  She does not want to increase the victoza from 0.9 mg daily.  She checked her blood sugars more often in the evening. Several readings in low 100s and she says these are usually following times of increased activity. She wants an exercise partner to help motivate her to do more- has a bicylce and doe snot ride it, wants to walk, wants to go to [employer] senior center.  Teeth- having problems with the 3 lower teeth she has and chewing Eyes-  Up to date Feet- visual foot exam done today- her skin and toenails are without problems, her shoes are too small and squeezing her toes   Blood sugar:meter download:average is 202 x 30 days,(decreased from 217 that was decreased from 223) her range is similar at 124-325.  A1C - due again in March   Medications-diabetes 36 units Novolin 70/30 in the am, Victoza 0.9 mg in the PM, centrum silver one time a week  Activity- ADLs, light walking, marching and stationary bicycling 5 minutes 3 times a day  Weight-168.4# a decrease of 1 more pound continues with gradual intentional decreasing trend  Dietary intake:  24 hour recall: restarted bologna, eating eggs, no juice, water, cereal with milk, bamboo shoots   Progress Towards Goal(s):  Some progress.   Nutritional Diagnosis:  NB-1.1 Food and nutrition-related knowledge deficit As related to lack of prior adequate diabetes meal planning is improving gradually As evidenced by her report and  food choices. Lack of material resources is a constant barrier.     Intervention:  Nutrition education and counseling about her blood sugar and weight results. Encouraged increasing her activity as much as possible.by helping her make a plan and commit to her  achieve own goals  Coordination of care-  none  Teaching Method Utilized: Visual, Auditory,Hands on Handouts given during visit include: AVS Barriers to learning/adherence to lifestyle change: very limited finances Demonstrated degree of understanding via:  Teach Back   Monitoring/Evaluation:  Dietary intake, exercise, meter, and body weight  4 weeks [name], RD 12/25/2017 2:49 PM.

## 2017-12-31 ENCOUNTER — Other Ambulatory Visit: Payer: Self-pay | Admitting: Internal Medicine

## 2018-01-08 ENCOUNTER — Other Ambulatory Visit: Payer: Self-pay

## 2018-01-08 NOTE — Patient Outreach (Signed)
[place] [facility]) Care Management  01/08/2018  TOCARA MENNEN 1951/03/21 [id number]   1st outreach attempt to the patient for monthly assessment. No answer.  Unable to leave a message.  Plan:  RN Health Coach will make an outreach attempt to the patient in one business day.  [name] RN, BSN, [place] Direct Dial:  [phone] Fax: [phone]

## 2018-01-09 ENCOUNTER — Other Ambulatory Visit: Payer: Self-pay

## 2018-01-09 NOTE — Patient Outreach (Signed)
[place] [facility]) Care Management  01/09/2018  [name] [birth year] [id number]   2nd telephone outreach to the patient for monthly assessment.  No answer.  HIPAA compliant voicemail left with contact information.  Plan: RN Health Coach will send letter for outreach attempt. RN Health Coach will make 3rd outreach attempt in ten business days. No answer I will proceed with case closure.     [name] RN, BSN, [place] Direct Dial:  [phone] Fax: [phone]

## 2018-01-18 ENCOUNTER — Other Ambulatory Visit: Payer: Self-pay

## 2018-01-18 NOTE — Patient Outreach (Signed)
[place] [facility]) Care Management  [place]  01/18/2018   [name] 09-29-1951 [id number]  Subjective: Telephone call placed to the patient for monthly assessment. HIPAA verified. The patient states that she is doing well. She denies any pain or falls. The patient checked her a1c this morning and it was 202.  She states that she is following her diet and adherent with her medications.  She is exercising for 30 minutes on her bike daily.  She has an appointment with her nutritionist on 01/22/2018. RN Health Coach encouraged the patient to continue monitoring her food, fluid intake and exercise.  The patient verbalized understanding.    Encounter Medications:  Outpatient Encounter Medications as of 01/18/2018  Medication Sig Note  . B-D UF III MINI PEN NEEDLES 31G X 5 MM MISC USE TO INJECT INSULIN TWICE A DAY   . BD INSULIN SYRINGE ULTRAFINE 31G X 15/64" 0.5 ML MISC USE TO INJECT INSULIN TWO TIMES A DAY   . cilostazol (PLETAL) 100 MG tablet Take 100 mg by mouth 2 (two) times daily. 05/09/2017: Ordered twice a day, taking only as needed  . clopidogrel (PLAVIX) 75 MG tablet TAKE 1 TABLET BY MOUTH EVERY MORNING   . gemfibrozil (LOPID) 600 MG tablet TAKE 1 TABLET BY MOUTH TWICE A DAY   . glucose blood (ACCU-CHEK AVIVA PLUS) test strip Use to check blood sugars twice a day.Dx Code: E11.22.   . hydrochlorothiazide (HYDRODIURIL) 25 MG tablet Take 1 tablet (25 mg total) by mouth daily.   . Insulin Isophane & Regular Human (NOVOLIN 70/30 FLEXPEN) (70-30) 100 UNIT/ML PEN Inject 36 Units into the skin daily.   Marland Kitchen levothyroxine (SYNTHROID, LEVOTHROID) 88 MCG tablet TAKE 1 TABLET BY MOUTH EVERY DAY BEFORE BREAKFAST   . linagliptin (TRADJENTA) 5 MG TABS tablet Take 5 mg by mouth daily.   Marland Kitchen liraglutide 18 MG/3ML SOPN Inject 0.2 mLs (1.2 mg total) into the skin daily.   Marland Kitchen losartan (COZAAR) 100 MG tablet TAKE 1 TABLET BY MOUTH EVERY DAY   . Multiple Vitamins-Minerals (MULTIVITAMIN ADULT  PO) Take by mouth once a week.   . ranitidine (ZANTAC) 150 MG tablet TAKE 1 TABLET BY MOUTH EVERY DAY AS NEEDED    No facility-administered encounter medications on file as of 01/18/2018.     Functional Status:  In your present state of health, do you have any difficulty performing the following activities: 10/24/2017 06/21/2017  Hearing? N N  Vision? N N  Difficulty concentrating or making decisions? N N  Walking or climbing stairs? N N  Dressing or bathing? N N  Comment - -  Doing errands, shopping? N N  Preparing Food and eating ? - N  Using the Toilet? - N  In the past six months, have you accidently leaked urine? - N  Do you have problems with loss of bowel control? - N  Managing your Medications? - N  Managing your Finances? - N  Housekeeping or managing your Housekeeping? - N  Some recent data might be hidden    Fall/Depression Screening: Fall Risk  01/18/2018 12/07/2017 10/31/2017  Falls in the past year? No No No   PHQ 2/9 Scores 10/24/2017 08/07/2017 06/21/2017 04/25/2017 03/20/2017 08/29/2016 08/10/2016  PHQ - 2 Score 0 0 0 0 0 0 0    Assessment: Patient continues to benefit from health coach outreach for disease management and support.    [facility] CM Care Plan Problem One     Most Recent Value  THN Long Term Goal   Patient will decrease A1c below 8.5 over the next 90 days.    THN Long Term Goal Start Date  01/18/18  Interventions for Problem One [place] reinterated with patient the importance  of diet, hydration and exercise with the patient.     Plan: RN Health Coach will contact patient in the month of April and patient agrees to next outreach.  [name] RN, BSN, [place] Direct Dial:  [phone] Fax: [phone]

## 2018-01-22 ENCOUNTER — Ambulatory Visit (INDEPENDENT_AMBULATORY_CARE_PROVIDER_SITE_OTHER): Payer: Medicare HMO | Admitting: Dietician

## 2018-01-22 VITALS — Wt 170.4 lb

## 2018-01-22 DIAGNOSIS — N183 Chronic kidney disease, stage 3 unspecified: Secondary | ICD-10-CM

## 2018-01-22 DIAGNOSIS — Z794 Long term (current) use of insulin: Secondary | ICD-10-CM

## 2018-01-22 DIAGNOSIS — Z6830 Body mass index (BMI) 30.0-30.9, adult: Secondary | ICD-10-CM

## 2018-01-22 DIAGNOSIS — E119 Type 2 diabetes mellitus without complications: Secondary | ICD-10-CM

## 2018-01-22 DIAGNOSIS — E1122 Type 2 diabetes mellitus with diabetic chronic kidney disease: Secondary | ICD-10-CM | POA: Diagnosis not present

## 2018-01-22 DIAGNOSIS — Z713 Dietary counseling and surveillance: Secondary | ICD-10-CM | POA: Diagnosis not present

## 2018-01-22 LAB — POCT GLYCOSYLATED HEMOGLOBIN (HGB A1C): Hemoglobin A1C: 8.3

## 2018-01-22 LAB — GLUCOSE, CAPILLARY: Glucose-Capillary: 104 mg/dL — ABNORMAL HIGH (ref 65–99)

## 2018-01-22 NOTE — Patient Instructions (Addendum)
Congrats for having the lowest A1c you have had in the past 7 years! Way to go-  I hear you want your A1C to be a 6 again.  We'll work towards that.   Keep doing what you are doing because it is working.    Congrats on making the first step to getting more activity/ exercise by going to the [facility] and registering.   See you in April!  [name] [phone]

## 2018-01-22 NOTE — Progress Notes (Signed)
  Medical Nutrition Therapy:  Appt start time: 1350 end time:  1962. Visit #18 (3 this year)  Assessment:  Primary concerns today: glycemic control and weight loss. Ms. Balestrieri is still doing well with liraglutide. Her weight is gradully decreasing. Marland Kitchen  She went to [facility] center and registered, but has not gone back yet. She reprots poor sleep with only a few hours of sleep at a time. She reports having had a sleep study with CPAP having been recommended. .  Blood sugar:meter download:average is 298 x 30 days,(decreased from 202< 217<223) her range is similar at 92-325. Her blood sugar pattern is clearly higher int eh am and lower  Midday and evening   Lab Results  Component Value Date   HGBA1C 8.3 01/22/2018  Medications-diabetes 36 units Novolin 70/30 in the am, Victoza 0.9 mg in the PM, centrum silver one time a week. She is considering 0.1mg  increase in the victoza to 1.0 mg daily.   Activity- ADLs, light walking, marching and stationary bicycling 5 minutes 3 times a day Weight-170.4# a plateau in her decreasing trend Dietary intake:  24 hour recall: ham, beans, eggs, yogurt, no juice, water, cereal with milk, mixed vegetables limits fruit becaue it appears to spike her blood sugar.   Progress Towards Goal(s):  Some progress.   Nutritional Diagnosis:  NB-1.1 Food and nutrition-related knowledge deficit As related to lack of prior adequate diabetes meal planning is improving gradually As evidenced by her report and  food choices. Lack of material resources is a constant barrier.     Intervention:  Nutrition education and counseling about her blood sugar, A1C, weight  results. Encouraged increased activyt and more fruits and vegetables.   Coordination of care-  Consider addressing Sleep issues, tingling in hands and feet and redo professional CGM  Teaching Method Utilized: Visual, Auditory,Hands on Handouts given during visit include: AVS Barriers to learning/adherence to lifestyle  change: very limited finances Demonstrated degree of understanding via:  Teach Back   Monitoring/Evaluation:  Dietary intake, exercise, meter, and body weight  4 weeks [name], RD 01/22/2018 3:45 PM.

## 2018-01-30 ENCOUNTER — Telehealth: Payer: Self-pay

## 2018-01-30 NOTE — Telephone Encounter (Signed)
Requesting to speak with a nurse about med. Please call pt back.  

## 2018-01-30 NOTE — Telephone Encounter (Signed)
[facility] stating insurance will no longer pay for Victoza. Hubbard Hartshorn, RN, BSN

## 2018-01-31 ENCOUNTER — Telehealth: Payer: Self-pay | Admitting: *Deleted

## 2018-01-31 ENCOUNTER — Other Ambulatory Visit: Payer: Self-pay | Admitting: Internal Medicine

## 2018-01-31 DIAGNOSIS — E1122 Type 2 diabetes mellitus with diabetic chronic kidney disease: Secondary | ICD-10-CM

## 2018-01-31 DIAGNOSIS — N183 Chronic kidney disease, stage 3 (moderate): Principal | ICD-10-CM

## 2018-01-31 NOTE — Telephone Encounter (Signed)
Call from pt - stated need rx on needles also the pharmacy will not give her Tradjenta and Victoza and she almost out of Victoza. Thanks

## 2018-01-31 NOTE — Telephone Encounter (Signed)
Not sure why patient is on both Tradjenta and Victoza. Please place a refill request to her PCP for both as well as pen needles.  Thanks

## 2018-01-31 NOTE — Telephone Encounter (Signed)
Patient's insurance will no longer pay for Victoza. Hubbard Hartshorn, RN, BSN

## 2018-01-31 NOTE — Telephone Encounter (Signed)
Information was sent to [place] for [place] for Victoza.  Awaiting decision.  Confirmation # [id number] Nelva Nay, RN 01/31/2018 11:33 AM.

## 2018-02-01 NOTE — Telephone Encounter (Addendum)
[name] submitted the prior auth on Victoza yesterday, so may still be pending. Patient has an upcoming appointment 02/06/18 to manage therapy. Thank you

## 2018-02-02 NOTE — Addendum Note (Signed)
Addended by: [name] on: 02/02/2018 07:37 PM   Modules accepted: Orders

## 2018-02-06 ENCOUNTER — Ambulatory Visit (INDEPENDENT_AMBULATORY_CARE_PROVIDER_SITE_OTHER): Payer: Medicare HMO | Admitting: Internal Medicine

## 2018-02-06 ENCOUNTER — Encounter: Payer: Self-pay | Admitting: Internal Medicine

## 2018-02-06 VITALS — BP 140/50 | HR 75 | Temp 97.9°F | Ht 62.5 in | Wt 168.1 lb

## 2018-02-06 DIAGNOSIS — E785 Hyperlipidemia, unspecified: Secondary | ICD-10-CM | POA: Diagnosis not present

## 2018-02-06 DIAGNOSIS — Z794 Long term (current) use of insulin: Secondary | ICD-10-CM | POA: Diagnosis not present

## 2018-02-06 DIAGNOSIS — E1122 Type 2 diabetes mellitus with diabetic chronic kidney disease: Secondary | ICD-10-CM | POA: Diagnosis not present

## 2018-02-06 DIAGNOSIS — I129 Hypertensive chronic kidney disease with stage 1 through stage 4 chronic kidney disease, or unspecified chronic kidney disease: Secondary | ICD-10-CM | POA: Diagnosis not present

## 2018-02-06 DIAGNOSIS — Z8673 Personal history of transient ischemic attack (TIA), and cerebral infarction without residual deficits: Secondary | ICD-10-CM

## 2018-02-06 DIAGNOSIS — Z79899 Other long term (current) drug therapy: Secondary | ICD-10-CM

## 2018-02-06 DIAGNOSIS — R531 Weakness: Secondary | ICD-10-CM

## 2018-02-06 DIAGNOSIS — E1151 Type 2 diabetes mellitus with diabetic peripheral angiopathy without gangrene: Secondary | ICD-10-CM

## 2018-02-06 DIAGNOSIS — N183 Chronic kidney disease, stage 3 unspecified: Secondary | ICD-10-CM

## 2018-02-06 DIAGNOSIS — R29898 Other symptoms and signs involving the musculoskeletal system: Secondary | ICD-10-CM

## 2018-02-06 DIAGNOSIS — R42 Dizziness and giddiness: Secondary | ICD-10-CM | POA: Diagnosis not present

## 2018-02-06 DIAGNOSIS — R471 Dysarthria and anarthria: Secondary | ICD-10-CM | POA: Diagnosis not present

## 2018-02-06 DIAGNOSIS — I10 Essential (primary) hypertension: Secondary | ICD-10-CM

## 2018-02-06 LAB — BASIC METABOLIC PANEL
ANION GAP: 10 (ref 5–15)
BUN: 24 mg/dL — ABNORMAL HIGH (ref 6–20)
CHLORIDE: 104 mmol/L (ref 101–111)
CO2: 25 mmol/L (ref 22–32)
Calcium: 9.7 mg/dL (ref 8.9–10.3)
Creatinine, Ser: 1.26 mg/dL — ABNORMAL HIGH (ref 0.44–1.00)
GFR, EST AFRICAN AMERICAN: 51 mL/min — AB (ref >60)
GFR, EST NON AFRICAN AMERICAN: 44 mL/min — AB (ref >60)
Glucose, Bld: 281 mg/dL — ABNORMAL HIGH (ref 65–99)
POTASSIUM: 3.6 mmol/L (ref 3.5–5.1)
SODIUM: 139 mmol/L (ref 135–145)

## 2018-02-06 LAB — POCT URINALYSIS DIPSTICK
Bilirubin, UA: NEGATIVE
GLUCOSE UA: NEGATIVE
Ketones, UA: NEGATIVE
Spec Grav, UA: >=1.03 — AB (ref 1.010–1.025)
Urobilinogen, UA: 0.2 E.U./dL
pH, UA: 6 (ref 5.0–8.0)

## 2018-02-06 NOTE — Patient Instructions (Signed)
We are working on getting your victoza approved. I have given you a sample to use until then.   I am checking some labs for another cause of your symptoms; if your symptoms don't improve or get worse in 1 week, please return to the clinic.

## 2018-02-06 NOTE — Progress Notes (Signed)
   CC: left arm weakness  HPI:  [name] is a 67 y.o. with a PMH of T2DM, HTN, HLD, PVD, h/o CVA without residual physical deficits presenting to clinic for dizziness and left arm weakness.  DM: Patient states that she ran out of Victoza a week ago and has not been able to fill it due to insurance issues, however she returned to taking her Novolin 70/30 36 units BID again. She endorses polyuria, band like headaches, and dizziness since stopping Victoza, denies nausea, vomiting, polydipsia. She has continued checking her CBGs and has not seem them significantly change. Glucometer download shows AM CBGs 120-300 consistently over last couple of months with same range for afternoon readings.   Left arm weakness, dysarthria: Patient states that she woke up late last night to go to the restroom and found that she has left arm weakness; she has had intermittent trouble holding onto her phone and bringing food up to her mouth (though she states she is right handed and uses her right hand to eat. She was also told by her daughter that she has some slurred speech. Patient denies numbness/tingling, facial droop, vision or hearing changes, falls, syncope, chest pain, palpitations, shortness of breath, fevers, chills, dysuria/hematuria, cough.  Patient has a h/o R genu CVA in 2012 at which time she had left sided weakness and dysarthria.  Please see problem based Assessment and Plan for status of patients chronic conditions.  Past Medical History:  Diagnosis Date  . Anemia   . Arteriosclerotic cardiovascular disease   . Claudication in peripheral vascular disease ([place])   . CVA (cerebral vascular accident) ([place]) 01/2011   affected rt side  . Diabetes mellitus 2000  . GERD (gastroesophageal reflux disease)   . Hyperlipidemia   . Hypertension   . Hypothyroidism   . Renal insufficiency   . Vitamin D deficiency     Review of Systems:   ROS Per HPI  Physical Exam:  Vitals:   02/06/18 1518    BP: (!) 140/50  Pulse: 75  Temp: 97.9 F (36.6 C)  TempSrc: Oral  SpO2: 100%  Weight: 168 lb 1.6 oz (76.2 kg)  Height: 5' 2.5" (1.588 m)   GENERAL- alert, co-operative, appears as stated age, not in any distress. HEENT- Atraumatic, normocephalic, EOMI, oral mucosa appears moist CARDIAC- RRR, no murmurs, rubs or gallops. RESP- Moving equal volumes of air, and clear to auscultation bilaterally, no wheezes or crackles. ABDOMEN- Soft, nontender, bowel sounds present. NEURO- CN 2-12 intact (no change in speech from previous examinations), sensation intact throughout, BLEs strength intact, mild left arm weakness however improves with distraction, neg rhomberg, no pronator drift, neg HINTS exam, normal heel-to-shin bil, normal rapid hand movement, gait antalgic which is stable for patient EXTREMITIES- pulse 2+ radial SKIN- Warm, dry PSYCH- Normal mood and affect, appropriate thought content and speech.  Assessment & Plan:   See Encounters Tab for problem based charting.   Patient discussed with Dr. Nilsa Nutting, MD Internal Medicine PGY2

## 2018-02-07 ENCOUNTER — Encounter: Payer: Self-pay | Admitting: Internal Medicine

## 2018-02-07 DIAGNOSIS — R29898 Other symptoms and signs involving the musculoskeletal system: Secondary | ICD-10-CM | POA: Insufficient documentation

## 2018-02-07 NOTE — Assessment & Plan Note (Addendum)
Patient states that she woke up late last night to go to the restroom and found that she has left arm weakness; she has had intermittent trouble holding onto her phone and bringing food up to her mouth (though she states she is right handed and uses her right hand to eat. She was also told by her daughter that she has some slurred speech. Patient denies numbness/tingling, facial droop, vision or hearing changes, falls, syncope, chest pain, shortness of breath, fevers, chills, dysuria/hematuria, cough.  Patient has a h/o R genu CVA in 2012 at which time she had left sided weakness and dysarthria.  Physical exam reveals intact CN, mild and fluctuating LA weakness but otherwise intact neuro exam.  Due to medication changes, UA and Bmet were obtained to assess for acidosis in setting of hyperglycemia, however this was not the case. UA was pos of bacteria, nitrites, and leuks however patient asymptomatic so this is likely benign bacteriuria. She has no other s/sx of infection or systemic process that might be exacerbating her pervious stroke symptoms.   Her BP is generally well controlled; she is intolerant of statins and has declined ezetemibe, and her DM is continued to be addressed. She has stopped smoking after her prior CVA.   In setting of above, will not pursue imaging at this time. She was advised to f/u in clinic if symptoms not improving or worsening. At that time based on symptoms and exam can consider MRI.

## 2018-02-07 NOTE — Assessment & Plan Note (Addendum)
Patient with dizziness and tension type headache since stopping Victoza one week ago. She has continued to take Trajenta and has increased her Novolin 70/30 to 36 units BID from once daily. CBGs largely unchanged.   In setting of her presenting complaints, UA and Bmet were checked to assess for acidosis and ketonuria; UA was without ketones (did show +nitrites and leuks but pt w/o symptoms), Bmet with stable renal function and normal bicarb.  *Per chart review, patient's previous DM medication regimen has been inconsistent; unfortunately not all notes are available to review on Epic. It appears that she has been on Lantus, metformin (biguinide), pioglitazone, rosiglitazone (thiazolidinediones), glimepiride and glipizide (sulfonyluria) in the past. Due to her CKD, she is not a candidate for starting SGLT-2 inhibitors - her eGFR is significantly improved today but general trend has been <45  Plan: --A1c 8.3 today - improved but still uncontrolled --patient given sample of Victoza; our nursing will continue working on getting prior approval from her insurance --advised patient to stop trajenta  --continue Novolin 70/30 mix at 36 units daily  --patient will continue following up with diabetes educator and will continue to incrementally increase her victoza dosing --f/u in 3 months

## 2018-02-07 NOTE — Assessment & Plan Note (Signed)
BP stable today.  Plan: --continue losartan 100mg  dialy, HCTZ 25mg  daily

## 2018-02-08 NOTE — Progress Notes (Signed)
Internal Medicine Clinic Attending  Case discussed with Dr. Svalina  at the time of the visit.  We reviewed the resident's history and exam and pertinent patient test results.  I agree with the assessment, diagnosis, and plan of care documented in the resident's note.  

## 2018-02-10 ENCOUNTER — Emergency Department (HOSPITAL_COMMUNITY): Payer: Medicare HMO

## 2018-02-10 ENCOUNTER — Encounter (HOSPITAL_COMMUNITY): Payer: Self-pay | Admitting: Emergency Medicine

## 2018-02-10 ENCOUNTER — Other Ambulatory Visit: Payer: Self-pay

## 2018-02-10 DIAGNOSIS — R531 Weakness: Secondary | ICD-10-CM | POA: Diagnosis not present

## 2018-02-10 DIAGNOSIS — R4781 Slurred speech: Secondary | ICD-10-CM | POA: Diagnosis not present

## 2018-02-10 DIAGNOSIS — R51 Headache: Secondary | ICD-10-CM | POA: Insufficient documentation

## 2018-02-10 DIAGNOSIS — Z5321 Procedure and treatment not carried out due to patient leaving prior to being seen by health care provider: Secondary | ICD-10-CM | POA: Insufficient documentation

## 2018-02-10 LAB — CBC
HCT: 33.7 % — ABNORMAL LOW (ref 36.0–46.0)
Hemoglobin: 10.9 g/dL — ABNORMAL LOW (ref 12.0–15.0)
MCH: 28.5 pg (ref 26.0–34.0)
MCHC: 32.3 g/dL (ref 30.0–36.0)
MCV: 88.2 fL (ref 78.0–100.0)
PLATELETS: 281 10*3/uL (ref 150–400)
RBC: 3.82 MIL/uL — ABNORMAL LOW (ref 3.87–5.11)
RDW: 14.3 % (ref 11.5–15.5)
WBC: 5.5 10*3/uL (ref 4.0–10.5)

## 2018-02-10 LAB — DIFFERENTIAL
BASOS ABS: 0 10*3/uL (ref 0.0–0.1)
BASOS PCT: 0 %
EOS ABS: 0.3 10*3/uL (ref 0.0–0.7)
Eosinophils Relative: 6 %
Lymphocytes Relative: 45 %
Lymphs Abs: 2.5 10*3/uL (ref 0.7–4.0)
MONOS PCT: 5 %
Monocytes Absolute: 0.3 10*3/uL (ref 0.1–1.0)
NEUTROS ABS: 2.4 10*3/uL (ref 1.7–7.7)
NEUTROS PCT: 44 %

## 2018-02-10 LAB — COMPREHENSIVE METABOLIC PANEL
ALT: 30 U/L (ref 14–54)
AST: 39 U/L (ref 15–41)
Albumin: 3.9 g/dL (ref 3.5–5.0)
Alkaline Phosphatase: 51 U/L (ref 38–126)
Anion gap: 11 (ref 5–15)
BUN: 20 mg/dL (ref 6–20)
CHLORIDE: 102 mmol/L (ref 101–111)
CO2: 24 mmol/L (ref 22–32)
CREATININE: 1.43 mg/dL — AB (ref 0.44–1.00)
Calcium: 9.9 mg/dL (ref 8.9–10.3)
GFR calc Af Amer: 43 mL/min — ABNORMAL LOW (ref >60)
GFR, EST NON AFRICAN AMERICAN: 38 mL/min — AB (ref >60)
Glucose, Bld: 199 mg/dL — ABNORMAL HIGH (ref 65–99)
Potassium: 4 mmol/L (ref 3.5–5.1)
SODIUM: 137 mmol/L (ref 135–145)
Total Bilirubin: 0.5 mg/dL (ref 0.3–1.2)
Total Protein: 8.1 g/dL (ref 6.5–8.1)

## 2018-02-10 LAB — I-STAT CHEM 8, ED
BUN: 23 mg/dL — ABNORMAL HIGH (ref 6–20)
CHLORIDE: 103 mmol/L (ref 101–111)
CREATININE: 1.4 mg/dL — AB (ref 0.44–1.00)
Calcium, Ion: 1.21 mmol/L (ref 1.15–1.40)
Glucose, Bld: 201 mg/dL — ABNORMAL HIGH (ref 65–99)
HEMATOCRIT: 33 % — AB (ref 36.0–46.0)
HEMOGLOBIN: 11.2 g/dL — AB (ref 12.0–15.0)
POTASSIUM: 4.1 mmol/L (ref 3.5–5.1)
Sodium: 141 mmol/L (ref 135–145)
TCO2: 25 mmol/L (ref 22–32)

## 2018-02-10 LAB — I-STAT TROPONIN, ED: TROPONIN I, POC: 0 ng/mL (ref 0.00–0.08)

## 2018-02-10 LAB — APTT: APTT: 45 s — AB (ref 24–36)

## 2018-02-10 LAB — PROTIME-INR
INR: 1.07
PROTHROMBIN TIME: 13.9 s (ref 11.4–15.2)

## 2018-02-10 NOTE — ED Triage Notes (Signed)
Pt presents with headache, slurred speech, and L arm weakness since Wednesday; pt states here PCP changed her medication on Wednesday and that's when she began having the symptoms of extreme headache, dropping items, etc.

## 2018-02-11 ENCOUNTER — Emergency Department (HOSPITAL_COMMUNITY)
Admission: EM | Admit: 2018-02-11 | Discharge: 2018-02-11 | Disposition: A | Discharge status: Home / Self Care | Payer: Medicare HMO | Attending: Emergency Medicine | Admitting: Emergency Medicine

## 2018-02-11 NOTE — ED Notes (Signed)
Per EMT pt has left.

## 2018-02-13 NOTE — ED Notes (Signed)
02/13/2018,  Attempted follow-up call, no answer. 

## 2018-02-15 ENCOUNTER — Emergency Department (HOSPITAL_COMMUNITY): Payer: Medicare HMO

## 2018-02-15 ENCOUNTER — Ambulatory Visit (INDEPENDENT_AMBULATORY_CARE_PROVIDER_SITE_OTHER): Payer: Medicare HMO | Admitting: Internal Medicine

## 2018-02-15 ENCOUNTER — Inpatient Hospital Stay (HOSPITAL_COMMUNITY)
Admission: EM | Admit: 2018-02-15 | Discharge: 2018-02-22 | DRG: 065 | Disposition: A | Discharge status: Rehabilitation Facility | Payer: Medicare HMO | Attending: Internal Medicine | Admitting: Internal Medicine

## 2018-02-15 ENCOUNTER — Other Ambulatory Visit: Payer: Self-pay

## 2018-02-15 ENCOUNTER — Encounter (HOSPITAL_COMMUNITY): Payer: Self-pay | Admitting: Emergency Medicine

## 2018-02-15 VITALS — BP 143/53 | HR 89 | Temp 98.0°F | Ht 62.0 in | Wt 164.4 lb

## 2018-02-15 DIAGNOSIS — Z7902 Long term (current) use of antithrombotics/antiplatelets: Secondary | ICD-10-CM

## 2018-02-15 DIAGNOSIS — I63311 Cerebral infarction due to thrombosis of right middle cerebral artery: Secondary | ICD-10-CM | POA: Diagnosis not present

## 2018-02-15 DIAGNOSIS — R29810 Facial weakness: Secondary | ICD-10-CM | POA: Diagnosis present

## 2018-02-15 DIAGNOSIS — E1165 Type 2 diabetes mellitus with hyperglycemia: Secondary | ICD-10-CM | POA: Diagnosis present

## 2018-02-15 DIAGNOSIS — R51 Headache: Secondary | ICD-10-CM | POA: Diagnosis not present

## 2018-02-15 DIAGNOSIS — E1122 Type 2 diabetes mellitus with diabetic chronic kidney disease: Secondary | ICD-10-CM | POA: Diagnosis present

## 2018-02-15 DIAGNOSIS — K5900 Constipation, unspecified: Secondary | ICD-10-CM | POA: Diagnosis not present

## 2018-02-15 DIAGNOSIS — I10 Essential (primary) hypertension: Secondary | ICD-10-CM | POA: Diagnosis not present

## 2018-02-15 DIAGNOSIS — E785 Hyperlipidemia, unspecified: Secondary | ICD-10-CM | POA: Diagnosis present

## 2018-02-15 DIAGNOSIS — F05 Delirium due to known physiological condition: Secondary | ICD-10-CM | POA: Diagnosis not present

## 2018-02-15 DIAGNOSIS — G459 Transient cerebral ischemic attack, unspecified: Secondary | ICD-10-CM | POA: Diagnosis present

## 2018-02-15 DIAGNOSIS — N183 Chronic kidney disease, stage 3 unspecified: Secondary | ICD-10-CM | POA: Diagnosis present

## 2018-02-15 DIAGNOSIS — R569 Unspecified convulsions: Secondary | ICD-10-CM

## 2018-02-15 DIAGNOSIS — Z87891 Personal history of nicotine dependence: Secondary | ICD-10-CM | POA: Diagnosis not present

## 2018-02-15 DIAGNOSIS — I69322 Dysarthria following cerebral infarction: Secondary | ICD-10-CM | POA: Diagnosis not present

## 2018-02-15 DIAGNOSIS — R29818 Other symptoms and signs involving the nervous system: Secondary | ICD-10-CM

## 2018-02-15 DIAGNOSIS — D62 Acute posthemorrhagic anemia: Secondary | ICD-10-CM | POA: Diagnosis not present

## 2018-02-15 DIAGNOSIS — R471 Dysarthria and anarthria: Secondary | ICD-10-CM | POA: Diagnosis present

## 2018-02-15 DIAGNOSIS — E039 Hypothyroidism, unspecified: Secondary | ICD-10-CM | POA: Diagnosis not present

## 2018-02-15 DIAGNOSIS — I459 Conduction disorder, unspecified: Secondary | ICD-10-CM | POA: Diagnosis present

## 2018-02-15 DIAGNOSIS — E119 Type 2 diabetes mellitus without complications: Secondary | ICD-10-CM | POA: Diagnosis present

## 2018-02-15 DIAGNOSIS — R011 Cardiac murmur, unspecified: Secondary | ICD-10-CM

## 2018-02-15 DIAGNOSIS — G8384 Todd's paralysis (postepileptic): Secondary | ICD-10-CM | POA: Diagnosis present

## 2018-02-15 DIAGNOSIS — I739 Peripheral vascular disease, unspecified: Secondary | ICD-10-CM | POA: Diagnosis not present

## 2018-02-15 DIAGNOSIS — R4781 Slurred speech: Secondary | ICD-10-CM | POA: Diagnosis not present

## 2018-02-15 DIAGNOSIS — I63411 Cerebral infarction due to embolism of right middle cerebral artery: Secondary | ICD-10-CM | POA: Diagnosis not present

## 2018-02-15 DIAGNOSIS — N179 Acute kidney failure, unspecified: Secondary | ICD-10-CM | POA: Diagnosis not present

## 2018-02-15 DIAGNOSIS — N390 Urinary tract infection, site not specified: Secondary | ICD-10-CM | POA: Diagnosis present

## 2018-02-15 DIAGNOSIS — G40109 Localization-related (focal) (partial) symptomatic epilepsy and epileptic syndromes with simple partial seizures, not intractable, without status epilepticus: Secondary | ICD-10-CM | POA: Diagnosis present

## 2018-02-15 DIAGNOSIS — K219 Gastro-esophageal reflux disease without esophagitis: Secondary | ICD-10-CM | POA: Diagnosis present

## 2018-02-15 DIAGNOSIS — Z794 Long term (current) use of insulin: Secondary | ICD-10-CM

## 2018-02-15 DIAGNOSIS — R030 Elevated blood-pressure reading, without diagnosis of hypertension: Secondary | ICD-10-CM | POA: Diagnosis not present

## 2018-02-15 DIAGNOSIS — R339 Retention of urine, unspecified: Secondary | ICD-10-CM | POA: Diagnosis present

## 2018-02-15 DIAGNOSIS — R5383 Other fatigue: Secondary | ICD-10-CM | POA: Diagnosis not present

## 2018-02-15 DIAGNOSIS — Z833 Family history of diabetes mellitus: Secondary | ICD-10-CM

## 2018-02-15 DIAGNOSIS — Z79899 Other long term (current) drug therapy: Secondary | ICD-10-CM | POA: Diagnosis not present

## 2018-02-15 DIAGNOSIS — N3090 Cystitis, unspecified without hematuria: Secondary | ICD-10-CM | POA: Diagnosis not present

## 2018-02-15 DIAGNOSIS — N1832 Chronic kidney disease, stage 3b: Secondary | ICD-10-CM | POA: Diagnosis present

## 2018-02-15 DIAGNOSIS — I63511 Cerebral infarction due to unspecified occlusion or stenosis of right middle cerebral artery: Secondary | ICD-10-CM | POA: Diagnosis not present

## 2018-02-15 DIAGNOSIS — T50996A Underdosing of other drugs, medicaments and biological substances, initial encounter: Secondary | ICD-10-CM | POA: Diagnosis present

## 2018-02-15 DIAGNOSIS — E876 Hypokalemia: Secondary | ICD-10-CM | POA: Diagnosis present

## 2018-02-15 DIAGNOSIS — E782 Mixed hyperlipidemia: Secondary | ICD-10-CM | POA: Diagnosis not present

## 2018-02-15 DIAGNOSIS — M62838 Other muscle spasm: Secondary | ICD-10-CM | POA: Diagnosis not present

## 2018-02-15 DIAGNOSIS — Z8673 Personal history of transient ischemic attack (TIA), and cerebral infarction without residual deficits: Secondary | ICD-10-CM

## 2018-02-15 DIAGNOSIS — D631 Anemia in chronic kidney disease: Secondary | ICD-10-CM | POA: Diagnosis present

## 2018-02-15 DIAGNOSIS — I69354 Hemiplegia and hemiparesis following cerebral infarction affecting left non-dominant side: Secondary | ICD-10-CM

## 2018-02-15 DIAGNOSIS — G441 Vascular headache, not elsewhere classified: Secondary | ICD-10-CM

## 2018-02-15 DIAGNOSIS — E669 Obesity, unspecified: Secondary | ICD-10-CM | POA: Diagnosis present

## 2018-02-15 DIAGNOSIS — R2689 Other abnormalities of gait and mobility: Secondary | ICD-10-CM | POA: Diagnosis not present

## 2018-02-15 DIAGNOSIS — Z888 Allergy status to other drugs, medicaments and biological substances status: Secondary | ICD-10-CM

## 2018-02-15 DIAGNOSIS — N3000 Acute cystitis without hematuria: Secondary | ICD-10-CM

## 2018-02-15 DIAGNOSIS — I6389 Other cerebral infarction: Secondary | ICD-10-CM | POA: Diagnosis present

## 2018-02-15 DIAGNOSIS — I2510 Atherosclerotic heart disease of native coronary artery without angina pectoris: Secondary | ICD-10-CM | POA: Diagnosis present

## 2018-02-15 DIAGNOSIS — E890 Postprocedural hypothyroidism: Secondary | ICD-10-CM | POA: Diagnosis present

## 2018-02-15 DIAGNOSIS — G8194 Hemiplegia, unspecified affecting left nondominant side: Secondary | ICD-10-CM | POA: Diagnosis present

## 2018-02-15 DIAGNOSIS — N3941 Urge incontinence: Secondary | ICD-10-CM

## 2018-02-15 DIAGNOSIS — E1151 Type 2 diabetes mellitus with diabetic peripheral angiopathy without gangrene: Secondary | ICD-10-CM | POA: Diagnosis present

## 2018-02-15 DIAGNOSIS — R531 Weakness: Secondary | ICD-10-CM

## 2018-02-15 DIAGNOSIS — Z9851 Tubal ligation status: Secondary | ICD-10-CM

## 2018-02-15 DIAGNOSIS — R29700 NIHSS score 0: Secondary | ICD-10-CM | POA: Diagnosis present

## 2018-02-15 DIAGNOSIS — R29898 Other symptoms and signs involving the musculoskeletal system: Secondary | ICD-10-CM

## 2018-02-15 DIAGNOSIS — I6300 Cerebral infarction due to thrombosis of unspecified precerebral artery: Secondary | ICD-10-CM | POA: Diagnosis not present

## 2018-02-15 DIAGNOSIS — E038 Other specified hypothyroidism: Secondary | ICD-10-CM | POA: Diagnosis not present

## 2018-02-15 DIAGNOSIS — R0989 Other specified symptoms and signs involving the circulatory and respiratory systems: Secondary | ICD-10-CM | POA: Diagnosis not present

## 2018-02-15 DIAGNOSIS — I129 Hypertensive chronic kidney disease with stage 1 through stage 4 chronic kidney disease, or unspecified chronic kidney disease: Secondary | ICD-10-CM | POA: Diagnosis present

## 2018-02-15 DIAGNOSIS — Z8042 Family history of malignant neoplasm of prostate: Secondary | ICD-10-CM

## 2018-02-15 DIAGNOSIS — I639 Cerebral infarction, unspecified: Secondary | ICD-10-CM | POA: Diagnosis present

## 2018-02-15 DIAGNOSIS — Z6830 Body mass index (BMI) 30.0-30.9, adult: Secondary | ICD-10-CM

## 2018-02-15 DIAGNOSIS — Z79890 Hormone replacement therapy (postmenopausal): Secondary | ICD-10-CM

## 2018-02-15 DIAGNOSIS — Z91138 Patient's unintentional underdosing of medication regimen for other reason: Secondary | ICD-10-CM

## 2018-02-15 DIAGNOSIS — G8114 Spastic hemiplegia affecting left nondominant side: Secondary | ICD-10-CM | POA: Diagnosis not present

## 2018-02-15 LAB — I-STAT CHEM 8, ED
BUN: 31 mg/dL — AB (ref 6–20)
CALCIUM ION: 1.2 mmol/L (ref 1.15–1.40)
CHLORIDE: 101 mmol/L (ref 101–111)
CREATININE: 1.5 mg/dL — AB (ref 0.44–1.00)
GLUCOSE: 200 mg/dL — AB (ref 65–99)
HCT: 35 % — ABNORMAL LOW (ref 36.0–46.0)
Hemoglobin: 11.9 g/dL — ABNORMAL LOW (ref 12.0–15.0)
Potassium: 4 mmol/L (ref 3.5–5.1)
SODIUM: 139 mmol/L (ref 135–145)
TCO2: 28 mmol/L (ref 22–32)

## 2018-02-15 MED ORDER — LORAZEPAM 2 MG/ML IJ SOLN
INTRAMUSCULAR | Status: AC
  Filled 2018-02-15: qty 1

## 2018-02-15 MED ORDER — SULFAMETHOXAZOLE-TRIMETHOPRIM 800-160 MG PO TABS: 1.0000 | ORAL_TABLET | Freq: Two times a day (BID) | ORAL | 0 refills | Status: DC

## 2018-02-15 NOTE — Progress Notes (Signed)
Internal Medicine Clinic Attending  I saw and evaluated the patient.  I personally confirmed the key portions of the history and exam documented by Dr. Ronalee Red and I reviewed pertinent patient test results.  The assessment, diagnosis, and plan were formulated together and I agree with the documentation in the resident's note. As noted, hx of CVA with left side weakness, woke up last wendsday with mild left side weakness and slurred speech.  Was seen in clinic, imaging deferred, then presented to ED, CT head w/o acute changes, left without being seen.  Now presenting with some continued symptoms, glucose remains uncontrolled but not at critical level.  We discussed with her admitting her to obtain STAT MRI of brain, but she refuses admission, we discussed this could result in worsening CVA with permeant deficit.  It is possible that she is unmasking her previous stroke.  Will plan to obtain MRI as outpatient as well as echo and doppler. She is now having some dsyruia, will treat for uncomplicated cystitis.  Will have her back next week, she is not on an ideal regimen for diabetes as she does not like frequent injections. Would consider starting on Xultophy at that visit.

## 2018-02-15 NOTE — Patient Instructions (Signed)
FOLLOW-UP INSTRUCTIONS When: 1 week For: follow up of symptoms What to bring: medications and glucometer   [name],  It was a pleasure to meet you today.  You may or may not have had another stroke. We talked about whether you want to be admitted to the hospital to further evaluate this, and you preferred not to. We are ordering a MRI of your brain, a picture of your heart (echo), and ultrasound of your neck as an outpatient. Hopefully these will be able to be done next week. If you start having worsening of your left sided weakness or you start noticing new numbness/weakness/facial symptoms, please call us at [phone] to let us know or go straight to the emergency room.  For your bladder infection, please take Bactrim 800-160mg  twice a day for 3 days.  Please see [place] back in 1 week.

## 2018-02-15 NOTE — ED Notes (Signed)
Pt began having a tonic clonic seizure with full body movement that lasted 45 seconds.

## 2018-02-15 NOTE — Assessment & Plan Note (Signed)
Assessment Previously on Tradjenta, which was switched to Victoza at her last visit. Also instructed to take Novolin 70/30 36u once daily instead of twice daily. She reports compliance with these changes, although she feels that her symptoms of left-sided weakness started because of the change in her diabetes medications. Glucometer with average BG 200.  Patient hesitant to increase the number of shots to self-inject. She did not want to do 2 injections of Novolin 70/30. Discussed possibility of changing her to Xultophy (which would combine long-acting insulin and GLP-1 agonist). She was interested in this possibility as it would mean fewer shots. We discussed not changing this for now given her other symptoms and issues, but can consider at her next visit  Plan - Continue Novolin 70/30 at 36u daily - Continue Victoza 1.2mg  daily - Can consider switching to Xultophy at next visit

## 2018-02-15 NOTE — Assessment & Plan Note (Addendum)
Assessment UA positive for nitrites, leukocytes, and bacteria at last visit, however she was asymptomatic during that visit. Today, she notes dysuria and urge incontinence since Friday. Will treat for UTI based on previous lab results and current symptoms, possibly causing recrudescence of previous stroke symptoms. GFR >30, thus will give normal dose Bactrim.  Plan - Bactrim 800-160mg  BID x3 days

## 2018-02-15 NOTE — Assessment & Plan Note (Addendum)
Assessment Continues to have left arm and left leg weakness with reported dysarthria. No other numbness or weakness of other extremities. She reports her symptoms are slightly better today compared to a week ago. Physical exam with normal CN exam, mild and fluctuating left-sided weakness. She also did have a systolic murmur on exam today, which had not previously been noted. UA positive for bacteria, nitrites, and leuks at last visit, and patient reports symptoms of dysuria and urgency at today's visit, so will treat for UTI.   Difficult to say whether she has had another stroke or whether she is having recrudescence of previous stroke symptoms in the setting of UTI. We discussed with the patient that we would recommend admission to the hospital for work-up, however patient stated that she did not want to be admitted. We counseled on the risks of not being admitted, including having another stroke. Patient continued to state she did not want to be admitted. We will thus attempt to expedite outpatient work-up for possible subacute stroke, as well as treat for UTI based on previous urine results. She was advised to follow up in clinic or present to the ER if her symptoms worsen  She is intolerant of statins - states she has tried pravastatin and rosuvastatin in the past and that it makes her stomach hurt. LDL 99. BP stable at 143/53 and DM being addressed by PCP. She also reports compliance with Plavix (hx of prior CVA).  Plan - Bactrim 800-160mg  BID x3d - MRI brain ordered - TTE ordered - Carotid vascular U/S ordered - F/u in 1 week

## 2018-02-15 NOTE — Progress Notes (Signed)
   CC: left sided weakness  HPI:  [name] is a 67 y.o. female with PMH of hx of R genu CVA in 2012, DM, and HTN who presents with subacute left sided weakness.  She seen in clinic on 3/28 with left-sided weakness with inconsistent exam. She reports 1 week of left arm weakness and left leg weakness. She states she drops things in her left hand and feels like her left leg is going to give out when she walks. Her daughter also states that she was having slurred speech. She denies any other numbness or weakness. She states these are the same symptoms as her 2 previous strokes, which she states occurred in 2004 and 2009. Chart review, she had an MRI brain in 2012 that showed right genu CVA. At that visit, there was concern for recrudescence of her previous stroke. BMP was negative for acidosis in the setting of hyperglycemia. UA was positive for bacteria, nitrites, and leukocytes, however the patient was asymptomatic during that visit, so she was not treated.  She presented to the emergency room on 4/1 with complaints of headache, slurred speech, and left arm weakness. She had a CT head done which was negative for acute stroke. The patient left the emergency room prior to completing the remainder of the workup.  Since Wednesday, she continues to endorse left-sided weakness that she states is better. She denies any other numbness or weakness. Today, she does state that she has had dysuria and urgency incontinence since Friday. She denies fevers, chest pain, or shortness of breath.  She reports compliance with the new changes in her diabetes medications. Glucometer shows an average BG of 200.  Past Medical History:  Diagnosis Date  . Anemia   . Arteriosclerotic cardiovascular disease   . Claudication in peripheral vascular disease ([place])   . CVA (cerebral vascular accident) ([place]) 01/2011   affected left side  . Diabetes mellitus 2000  . GERD (gastroesophageal reflux disease)   .  Hyperlipidemia   . Hypertension   . Hypothyroidism   . Renal insufficiency   . Vitamin D deficiency    Review of Systems:   GEN: Negative for fevers NEURO: Positive for left sided weakness. Reported dysarthria CV: Negative for chest pain or palpitations PULM: Negative for SOB or cough GU: Positive for dysuria and urge incontinence  Physical Exam:  Vitals:   02/15/18 1534  BP: (!) 143/53  Pulse: 89  Temp: 98 F (36.7 C)  TempSrc: Oral  SpO2: 100%  Weight: 164 lb 6.4 oz (74.6 kg)  Height: 5\' 2"  (1.575 m)   GEN: Sitting in chair comfortably in NAD NEURO: 4-4+/5 strength in LUE and LLE, 5/5 strength in RUE and RLE. Normal sensation bilaterally. Normal finger-nose-finger and heel-to-shin. CN II-XII intact. CV: NR & RR, systolic murmur best heard at RUSB, no carotid bruits PULM: CTAB, no wheezes or rales ABD: No suprapubic tenderness to palpation MSK: No LE edema  Assessment & Plan:   See Encounters Tab for problem based charting.  Patient seen with Dr. Angelia Mould

## 2018-02-15 NOTE — Assessment & Plan Note (Signed)
Assessment BP stable  Plan - Continue losartan 100mg  daily, HCTZ 25mg  daily

## 2018-02-15 NOTE — ED Notes (Signed)
Nurse drawing labs. 

## 2018-02-15 NOTE — ED Provider Notes (Signed)
[facility] EMERGENCY DEPARTMENT Provider Note   CSN: [id number] Arrival date & time: 02/15/18  2252   History   Chief Complaint Chief Complaint  Patient presents with  . Transient Ischemic Attack   Level 5 caveat due to acuity of condition  HPI [name] is a 67 y.o. female.  The history is provided by the patient and the EMS personnel.  Weakness  Primary symptoms include focal weakness. This is a new problem. The current episode started more than 2 days ago. There was left facial, left upper extremity and left lower extremity focality noted. There has been no fever. Associated symptoms comments: Seizure.  Patient presents for left-sided weakness.  Patient has had multiple episodes of left-sided weakness of the past several days.  She was in the emergency department several days ago but left prior to final evaluation.  She has also been seen by her PCP since that time.  Today EMS was called twice to her house, and on the second visit they brought her to the hospital.  EMS report noted left facial weakness and left sided weakness, but that improved.  I was called to the room because patient was actively seizing for 45 seconds.  No other details are known at this time  Past Medical History:  Diagnosis Date  . Anemia   . Arteriosclerotic cardiovascular disease   . Claudication in peripheral vascular disease ([place])   . CVA (cerebral vascular accident) ([place]) 01/2011   affected left side  . Diabetes mellitus 2000  . GERD (gastroesophageal reflux disease)   . Hyperlipidemia   . Hypertension   . Hypothyroidism   . Renal insufficiency   . Vitamin D deficiency     Patient Active Problem List   Diagnosis Date Noted  . Left arm weakness 02/07/2018  . Gingivitis 10/24/2017  . Dysphagia 01/16/2017  . GERD (gastroesophageal reflux disease) 06/01/2015  . Normocytic anemia 06/01/2015  . Healthcare maintenance 06/01/2015  . Diastolic dysfunction 50/35/4656  .  Vitamin D deficiency 06/01/2015  . CKD (chronic kidney disease), stage III ([place]) 03/01/2014  . UTI (urinary tract infection) 03/01/2014  . PVD (peripheral vascular disease) ([place]) 06/18/2013  . Hypothyroidism 01/10/2007  . Hyperlipidemia 01/10/2007  . History of tobacco use 01/10/2007  . HYPERTENSION, BENIGN SYSTEMIC 01/10/2007  . History of CVA (cerebrovascular accident) 01/10/2007  . Diabetes mellitus with stage 3 chronic kidney disease ([place]) 01/10/1989    Past Surgical History:  Procedure Laterality Date  . CESAREAN SECTION    . LEFT HEART CATHETERIZATION WITH CORONARY ANGIOGRAM N/A 03/08/2012   Procedure: LEFT HEART CATHETERIZATION WITH CORONARY ANGIOGRAM;  Surgeon: Sinclair Grooms, MD;  Location: [facility] CATH LAB;  Service: Cardiovascular;  Laterality: N/A;  . LEFT HEART CATHETERIZATION WITH CORONARY ANGIOGRAM N/A 12/17/2012   Procedure: LEFT HEART CATHETERIZATION WITH CORONARY ANGIOGRAM;  Surgeon: Laverda Page, MD;  Location: [facility] CATH LAB;  Service: Cardiovascular;  Laterality: N/A;  . LOWER EXTREMITY ANGIOGRAM N/A 12/17/2012   Procedure: LOWER EXTREMITY ANGIOGRAM;  Surgeon: Laverda Page, MD;  Location: [facility] CATH LAB;  Service: Cardiovascular;  Laterality: N/A;  . TUBAL LIGATION       OB History   None      Home Medications    Prior to Admission medications   Medication Sig Start Date End Date Taking? Authorizing Provider  BD INSULIN SYRINGE ULTRAFINE 31G X 15/64" 0.5 ML MISC USE TO INJECT INSULIN TWO TIMES A DAY 11/15/16   [name], MD  cilostazol (  PLETAL) 100 MG tablet Take 100 mg by mouth 2 (two) times daily.    [provider]  clopidogrel (PLAVIX) 75 MG tablet TAKE 1 TABLET BY MOUTH EVERY MORNING 08/08/17   [name], MD  gemfibrozil (LOPID) 600 MG tablet TAKE 1 TABLET BY MOUTH TWICE A DAY 12/31/17   Aldine Contes, MD  glucose blood (ACCU-CHEK AVIVA PLUS) test strip Use to check blood sugars twice a day.Dx Code: E11.22. 02/12/17   [name], MD   hydrochlorothiazide (HYDRODIURIL) 25 MG tablet Take 1 tablet (25 mg total) by mouth daily. 10/24/17   [name], MD  Insulin Isophane & Regular Human (NOVOLIN 70/30 FLEXPEN) (70-30) 100 UNIT/ML PEN Inject 36 Units into the skin daily. 12/07/17   [name], MD  Insulin Pen Needle (B-D UF III MINI PEN NEEDLES) 31G X 5 MM MISC The patient is insulin requiring, ICD 10 code E11.9. The patient injects insulin 2 times per day. 02/01/18   [name], MD  levothyroxine (SYNTHROID, LEVOTHROID) 88 MCG tablet TAKE 1 TABLET BY MOUTH EVERY DAY BEFORE BREAKFAST 09/24/17   [name], MD  liraglutide 18 MG/3ML SOPN Inject 0.2 mLs (1.2 mg total) into the skin daily. 08/29/17   [name], MD  losartan (COZAAR) 100 MG tablet TAKE 1 TABLET BY MOUTH EVERY DAY 09/17/17   [name], MD  Multiple Vitamins-Minerals (MULTIVITAMIN ADULT PO) Take by mouth once a week.    [provider]  ranitidine (ZANTAC) 150 MG tablet TAKE 1 TABLET BY MOUTH EVERY DAY AS NEEDED 08/12/15   Juluis Mire, MD  sulfamethoxazole-trimethoprim (BACTRIM DS,SEPTRA DS) 800-160 MG tablet Take 1 tablet by mouth 2 (two) times daily for 3 days. 02/15/18 02/18/18  Colbert Ewing, MD    Family History Family History  Problem Relation Age of Onset  . Diabetes Brother   . Prostate cancer Brother   . Diabetes Mother   . Diabetes Father   . Diabetes Sister   . Esophageal cancer Neg Hx   . Rectal cancer Neg Hx   . Stomach cancer Neg Hx   . Colon cancer Neg Hx   . Breast cancer Neg Hx     Social History Social History   Tobacco Use  . Smoking status: Former Smoker    Last attempt to quit: 02/04/2011    Years since quitting: 7.0  . Smokeless tobacco: Never Used  Substance Use Topics  . Alcohol use: No    Alcohol/week: 0.0 oz  . Drug use: No     Allergies   Insulins and Statins   Review of Systems Review of Systems  Unable to perform ROS: Acuity of condition  Neurological: Positive for focal  weakness and weakness.     Physical Exam Updated Vital Signs BP (!) 191/83 (BP Location: Left Arm)   Pulse 82   Temp 98.6 F (37 C) (Oral)   Resp 17   Ht 1.575 m (5\' 2" )   Wt 74.8 kg (165 lb)   SpO2 95%   BMI 30.18 kg/m   Physical Exam  CONSTITUTIONAL: Well developed/well nourished HEAD: Normocephalic/atraumatic EYES: PERRL, eyes deviated to right ENMT: Mucous membranes moist NECK: supple no meningeal signs SPINE/BACK:entire spine nontender CV: S1/S2 noted, no murmurs/rubs/gallops noted LUNGS: Lungs are clear to auscultation bilaterally, no apparent distress ABDOMEN: soft, nontender, no rebound or guarding, bowel sounds noted throughout abdomen GU:no cva tenderness NEURO: Pt is awake/alert, appears confused left facial droop, left arm drift, left leg drift.   EXTREMITIES: pulses normal/equal, full ROM SKIN:  warm, color normal PSYCH: unable To assess  ED Treatments / Results  Labs (all labs ordered are listed, but only abnormal results are displayed) Labs Reviewed  APTT - Abnormal; Notable for the following components:      Result Value   aPTT 39 (*)    All other components within normal limits  CBC - Abnormal; Notable for the following components:   RBC 3.79 (*)    Hemoglobin 10.9 (*)    HCT 33.8 (*)    All other components within normal limits  I-STAT CHEM 8, ED - Abnormal; Notable for the following components:   BUN 31 (*)    Creatinine, Ser 1.50 (*)    Glucose, Bld 200 (*)    Hemoglobin 11.9 (*)    HCT 35.0 (*)    All other components within normal limits  PROTIME-INR  DIFFERENTIAL  ETHANOL  COMPREHENSIVE METABOLIC PANEL  RAPID URINE DRUG SCREEN, HOSP PERFORMED  URINALYSIS, ROUTINE W REFLEX MICROSCOPIC  I-STAT TROPONIN, ED    EKG EKG Interpretation  Date/Time:  Friday February 15 2018 22:53:08 EDT Ventricular Rate:  83 PR Interval:    QRS Duration: 95 QT Interval:  391 QTC Calculation: 460 R Axis:   66 Text Interpretation:  Sinus rhythm  Prolonged PR interval Nonspecific T abnormalities, lateral leads Minimal ST elevation, anterior leads Confirmed by Ripley Fraise [phone]) on 02/16/2018 12:13:41 AM   Radiology Ct Head Wo Contrast  Result Date: 02/16/2018 CLINICAL DATA:  TIA symptoms since Sunday. Left-sided facial droop, left-sided weakness, and right temporal headache. EXAM: CT HEAD WITHOUT CONTRAST TECHNIQUE: Contiguous axial images were obtained from the base of the skull through the vertex without intravenous contrast. COMPARISON:  02/10/2018 FINDINGS: Brain: Diffuse cerebral atrophy. Low-attenuation changes in the deep white matter suggesting small vessel ischemia. Increased asymmetrical low-attenuation change in the right periventricular white matter and insular region since previous study. This may represent area of acute infarct. MRI would be more sensitive for detection of acute infarct. No significant mass effect or midline shift. Basal cisterns are not effaced. No acute intracranial hemorrhage. Vascular: Intracranial arterial vascular calcifications are present. Skull: Calvarium appears intact. No acute depressed skull fractures. Sinuses/Orbits: No acute finding. Other: None. IMPRESSION: Suggestion of acute infarct in the right periventricular and insular region. Consider MRI correlation if clinically indicated. No acute intracranial hemorrhage or mass effect. Electronically Signed   By: William  Stevens M.D.   On: 02/16/2018 00:21    Procedures Procedures (including critical care time)  Medications Ordered in ED Medications  fentaNYL (SUBLIMAZE) injection 25 mcg (25 mcg Intravenous Given 02/16/18 0023)     Initial Impression / Assessment and Plan / ED Course  I have reviewed the triage vital signs and the nursing notes.  Pertinent labs & imaging results that were available during my care of the patient were reviewed by me and considered in my medical decision making (see chart for details).     11 :41 PM Was called  to room due to seizures.  By time I got to the room the seizure had terminated. She was not given any medications. On initial exam she had left-sided weakness, but this began to rapidly improve, at this time there is no weakness.  Patient is awake alert neuroimaging ordered, labs pending and will need to be admitted tPA in stroke considered but not given due to: Symptoms resolved Seizure prior to onset of weakness 12:14 AM She is back to baseline.  She is awake alert no focal weakness We will  consult neurology.  Will need to be admitted.  I updated patient and her daughter. 12:27 AM Discussed with Dr. Rory Percy with neurology, he will see patient.  Will admit to medical service 12:40 AM Discussed with internal medicine service for admission.  Patient is awake alert at this time.  No arm drift noted no significant leg drift noted Final Clinical Impressions(s) / ED Diagnoses   Final diagnoses:  TIA (transient ischemic attack)  Seizure [facility])    ED Discharge Orders    None       Ripley Fraise, MD 02/16/18 0041

## 2018-02-15 NOTE — ED Triage Notes (Signed)
Pt presents with EMS for TIA symptoms since Sunday. One episode was witnessed by EMS today and pt reports 4 other episodes. Pt reports stroke history. EMS reports pt had left sided facial droop, left sided weakness, and right temporal headache. Pt still reports the headache is present but none of the other symptoms are present.

## 2018-02-16 ENCOUNTER — Other Ambulatory Visit (HOSPITAL_COMMUNITY): Payer: Medicaid Other

## 2018-02-16 ENCOUNTER — Inpatient Hospital Stay (HOSPITAL_COMMUNITY): Payer: Medicare HMO

## 2018-02-16 DIAGNOSIS — I639 Cerebral infarction, unspecified: Secondary | ICD-10-CM | POA: Diagnosis present

## 2018-02-16 DIAGNOSIS — R2689 Other abnormalities of gait and mobility: Secondary | ICD-10-CM | POA: Diagnosis not present

## 2018-02-16 DIAGNOSIS — I63511 Cerebral infarction due to unspecified occlusion or stenosis of right middle cerebral artery: Secondary | ICD-10-CM

## 2018-02-16 DIAGNOSIS — Z833 Family history of diabetes mellitus: Secondary | ICD-10-CM

## 2018-02-16 DIAGNOSIS — I6300 Cerebral infarction due to thrombosis of unspecified precerebral artery: Secondary | ICD-10-CM | POA: Diagnosis not present

## 2018-02-16 DIAGNOSIS — Z7902 Long term (current) use of antithrombotics/antiplatelets: Secondary | ICD-10-CM

## 2018-02-16 DIAGNOSIS — R471 Dysarthria and anarthria: Secondary | ICD-10-CM | POA: Diagnosis present

## 2018-02-16 DIAGNOSIS — G8114 Spastic hemiplegia affecting left nondominant side: Secondary | ICD-10-CM | POA: Diagnosis not present

## 2018-02-16 DIAGNOSIS — R569 Unspecified convulsions: Secondary | ICD-10-CM | POA: Diagnosis not present

## 2018-02-16 DIAGNOSIS — Z888 Allergy status to other drugs, medicaments and biological substances status: Secondary | ICD-10-CM

## 2018-02-16 DIAGNOSIS — I2510 Atherosclerotic heart disease of native coronary artery without angina pectoris: Secondary | ICD-10-CM | POA: Diagnosis present

## 2018-02-16 DIAGNOSIS — R29810 Facial weakness: Secondary | ICD-10-CM | POA: Diagnosis present

## 2018-02-16 DIAGNOSIS — M62838 Other muscle spasm: Secondary | ICD-10-CM | POA: Diagnosis not present

## 2018-02-16 DIAGNOSIS — I63311 Cerebral infarction due to thrombosis of right middle cerebral artery: Secondary | ICD-10-CM | POA: Diagnosis not present

## 2018-02-16 DIAGNOSIS — R5383 Other fatigue: Secondary | ICD-10-CM | POA: Diagnosis not present

## 2018-02-16 DIAGNOSIS — T50996A Underdosing of other drugs, medicaments and biological substances, initial encounter: Secondary | ICD-10-CM | POA: Diagnosis present

## 2018-02-16 DIAGNOSIS — Z87891 Personal history of nicotine dependence: Secondary | ICD-10-CM | POA: Diagnosis not present

## 2018-02-16 DIAGNOSIS — E785 Hyperlipidemia, unspecified: Secondary | ICD-10-CM

## 2018-02-16 DIAGNOSIS — Z794 Long term (current) use of insulin: Secondary | ICD-10-CM

## 2018-02-16 DIAGNOSIS — N390 Urinary tract infection, site not specified: Secondary | ICD-10-CM | POA: Diagnosis present

## 2018-02-16 DIAGNOSIS — R339 Retention of urine, unspecified: Secondary | ICD-10-CM | POA: Diagnosis present

## 2018-02-16 DIAGNOSIS — I69354 Hemiplegia and hemiparesis following cerebral infarction affecting left non-dominant side: Secondary | ICD-10-CM | POA: Diagnosis not present

## 2018-02-16 DIAGNOSIS — E782 Mixed hyperlipidemia: Secondary | ICD-10-CM | POA: Diagnosis not present

## 2018-02-16 DIAGNOSIS — N179 Acute kidney failure, unspecified: Secondary | ICD-10-CM | POA: Diagnosis not present

## 2018-02-16 DIAGNOSIS — E039 Hypothyroidism, unspecified: Secondary | ICD-10-CM | POA: Diagnosis not present

## 2018-02-16 DIAGNOSIS — I459 Conduction disorder, unspecified: Secondary | ICD-10-CM | POA: Diagnosis present

## 2018-02-16 DIAGNOSIS — I739 Peripheral vascular disease, unspecified: Secondary | ICD-10-CM | POA: Diagnosis not present

## 2018-02-16 DIAGNOSIS — G40109 Localization-related (focal) (partial) symptomatic epilepsy and epileptic syndromes with simple partial seizures, not intractable, without status epilepticus: Secondary | ICD-10-CM | POA: Diagnosis present

## 2018-02-16 DIAGNOSIS — E890 Postprocedural hypothyroidism: Secondary | ICD-10-CM | POA: Diagnosis present

## 2018-02-16 DIAGNOSIS — I63411 Cerebral infarction due to embolism of right middle cerebral artery: Secondary | ICD-10-CM | POA: Diagnosis not present

## 2018-02-16 DIAGNOSIS — E1165 Type 2 diabetes mellitus with hyperglycemia: Secondary | ICD-10-CM | POA: Diagnosis present

## 2018-02-16 DIAGNOSIS — R0989 Other specified symptoms and signs involving the circulatory and respiratory systems: Secondary | ICD-10-CM | POA: Diagnosis not present

## 2018-02-16 DIAGNOSIS — G8384 Todd's paralysis (postepileptic): Secondary | ICD-10-CM | POA: Diagnosis present

## 2018-02-16 DIAGNOSIS — Z8673 Personal history of transient ischemic attack (TIA), and cerebral infarction without residual deficits: Secondary | ICD-10-CM

## 2018-02-16 DIAGNOSIS — N183 Chronic kidney disease, stage 3 (moderate): Secondary | ICD-10-CM | POA: Diagnosis present

## 2018-02-16 DIAGNOSIS — G8194 Hemiplegia, unspecified affecting left nondominant side: Secondary | ICD-10-CM | POA: Diagnosis present

## 2018-02-16 DIAGNOSIS — E876 Hypokalemia: Secondary | ICD-10-CM | POA: Diagnosis present

## 2018-02-16 DIAGNOSIS — G459 Transient cerebral ischemic attack, unspecified: Secondary | ICD-10-CM | POA: Diagnosis present

## 2018-02-16 DIAGNOSIS — I6389 Other cerebral infarction: Secondary | ICD-10-CM | POA: Diagnosis present

## 2018-02-16 DIAGNOSIS — E038 Other specified hypothyroidism: Secondary | ICD-10-CM | POA: Diagnosis not present

## 2018-02-16 DIAGNOSIS — E1122 Type 2 diabetes mellitus with diabetic chronic kidney disease: Secondary | ICD-10-CM | POA: Diagnosis present

## 2018-02-16 DIAGNOSIS — I10 Essential (primary) hypertension: Secondary | ICD-10-CM | POA: Diagnosis not present

## 2018-02-16 DIAGNOSIS — D631 Anemia in chronic kidney disease: Secondary | ICD-10-CM | POA: Diagnosis present

## 2018-02-16 DIAGNOSIS — G441 Vascular headache, not elsewhere classified: Secondary | ICD-10-CM | POA: Diagnosis present

## 2018-02-16 DIAGNOSIS — I129 Hypertensive chronic kidney disease with stage 1 through stage 4 chronic kidney disease, or unspecified chronic kidney disease: Secondary | ICD-10-CM | POA: Diagnosis present

## 2018-02-16 DIAGNOSIS — N3000 Acute cystitis without hematuria: Secondary | ICD-10-CM | POA: Diagnosis not present

## 2018-02-16 DIAGNOSIS — E1151 Type 2 diabetes mellitus with diabetic peripheral angiopathy without gangrene: Secondary | ICD-10-CM | POA: Diagnosis present

## 2018-02-16 DIAGNOSIS — Z79899 Other long term (current) drug therapy: Secondary | ICD-10-CM | POA: Diagnosis not present

## 2018-02-16 DIAGNOSIS — F05 Delirium due to known physiological condition: Secondary | ICD-10-CM | POA: Diagnosis not present

## 2018-02-16 DIAGNOSIS — D62 Acute posthemorrhagic anemia: Secondary | ICD-10-CM | POA: Diagnosis not present

## 2018-02-16 DIAGNOSIS — R011 Cardiac murmur, unspecified: Secondary | ICD-10-CM | POA: Diagnosis present

## 2018-02-16 DIAGNOSIS — K219 Gastro-esophageal reflux disease without esophagitis: Secondary | ICD-10-CM | POA: Diagnosis present

## 2018-02-16 LAB — RAPID URINE DRUG SCREEN, HOSP PERFORMED
AMPHETAMINES: NOT DETECTED
BARBITURATES: NOT DETECTED
BENZODIAZEPINES: NOT DETECTED
COCAINE: NOT DETECTED
Opiates: NOT DETECTED
TETRAHYDROCANNABINOL: NOT DETECTED

## 2018-02-16 LAB — COMPREHENSIVE METABOLIC PANEL
ALT: 22 U/L (ref 14–54)
ANION GAP: 14 (ref 5–15)
AST: 32 U/L (ref 15–41)
Albumin: 4.2 g/dL (ref 3.5–5.0)
Alkaline Phosphatase: 55 U/L (ref 38–126)
BILIRUBIN TOTAL: 0.5 mg/dL (ref 0.3–1.2)
BUN: 26 mg/dL — ABNORMAL HIGH (ref 6–20)
CHLORIDE: 100 mmol/L — AB (ref 101–111)
CO2: 24 mmol/L (ref 22–32)
Calcium: 9.9 mg/dL (ref 8.9–10.3)
Creatinine, Ser: 1.6 mg/dL — ABNORMAL HIGH (ref 0.44–1.00)
GFR, EST AFRICAN AMERICAN: 38 mL/min — AB (ref >60)
GFR, EST NON AFRICAN AMERICAN: 33 mL/min — AB (ref >60)
Glucose, Bld: 204 mg/dL — ABNORMAL HIGH (ref 65–99)
POTASSIUM: 3.9 mmol/L (ref 3.5–5.1)
Sodium: 138 mmol/L (ref 135–145)
TOTAL PROTEIN: 8.6 g/dL — AB (ref 6.5–8.1)

## 2018-02-16 LAB — URINALYSIS, ROUTINE W REFLEX MICROSCOPIC
Bilirubin Urine: NEGATIVE
Glucose, UA: NEGATIVE mg/dL
Ketones, ur: NEGATIVE mg/dL
Nitrite: NEGATIVE
PH: 5 (ref 5.0–8.0)
Protein, ur: 30 mg/dL — AB
SPECIFIC GRAVITY, URINE: 1.016 (ref 1.005–1.030)

## 2018-02-16 LAB — APTT: aPTT: 39 seconds — ABNORMAL HIGH (ref 24–36)

## 2018-02-16 LAB — DIFFERENTIAL
BASOS PCT: 0 %
Basophils Absolute: 0 10*3/uL (ref 0.0–0.1)
EOS ABS: 0.2 10*3/uL (ref 0.0–0.7)
EOS PCT: 2 %
LYMPHS ABS: 2 10*3/uL (ref 0.7–4.0)
LYMPHS PCT: 29 %
MONO ABS: 0.4 10*3/uL (ref 0.1–1.0)
MONOS PCT: 5 %
NEUTROS PCT: 64 %
Neutro Abs: 4.4 10*3/uL (ref 1.7–7.7)

## 2018-02-16 LAB — LIPID PANEL
CHOL/HDL RATIO: 5.6 ratio
CHOLESTEROL: 168 mg/dL (ref 0–200)
HDL: 30 mg/dL — AB (ref >40)
LDL Cholesterol: 87 mg/dL (ref 0–99)
Triglycerides: 255 mg/dL — ABNORMAL HIGH (ref <150)
VLDL: 51 mg/dL — ABNORMAL HIGH (ref 0–40)

## 2018-02-16 LAB — BASIC METABOLIC PANEL
Anion gap: 15 (ref 5–15)
BUN: 26 mg/dL — AB (ref 6–20)
CALCIUM: 9.5 mg/dL (ref 8.9–10.3)
CO2: 23 mmol/L (ref 22–32)
Chloride: 97 mmol/L — ABNORMAL LOW (ref 101–111)
Creatinine, Ser: 1.46 mg/dL — ABNORMAL HIGH (ref 0.44–1.00)
GFR calc Af Amer: 42 mL/min — ABNORMAL LOW (ref >60)
GFR, EST NON AFRICAN AMERICAN: 37 mL/min — AB (ref >60)
GLUCOSE: 220 mg/dL — AB (ref 65–99)
Potassium: 3.4 mmol/L — ABNORMAL LOW (ref 3.5–5.1)
Sodium: 135 mmol/L (ref 135–145)

## 2018-02-16 LAB — PROTIME-INR
INR: 1.1
PROTHROMBIN TIME: 14.1 s (ref 11.4–15.2)

## 2018-02-16 LAB — GLUCOSE, CAPILLARY
GLUCOSE-CAPILLARY: 187 mg/dL — AB (ref 65–99)
GLUCOSE-CAPILLARY: 149 mg/dL — AB (ref 65–99)
GLUCOSE-CAPILLARY: 211 mg/dL — AB (ref 65–99)
Glucose-Capillary: 243 mg/dL — ABNORMAL HIGH (ref 65–99)
Glucose-Capillary: 129 mg/dL — ABNORMAL HIGH (ref 65–99)

## 2018-02-16 LAB — CBC
HCT: 33.8 % — ABNORMAL LOW (ref 36.0–46.0)
HCT: 33.6 % — ABNORMAL LOW (ref 36.0–46.0)
Hemoglobin: 10.9 g/dL — ABNORMAL LOW (ref 12.0–15.0)
Hemoglobin: 10.6 g/dL — ABNORMAL LOW (ref 12.0–15.0)
MCH: 28.8 pg (ref 26.0–34.0)
MCH: 28 pg (ref 26.0–34.0)
MCHC: 32.2 g/dL (ref 30.0–36.0)
MCHC: 31.5 g/dL (ref 30.0–36.0)
MCV: 89.2 fL (ref 78.0–100.0)
MCV: 88.7 fL (ref 78.0–100.0)
PLATELETS: 275 10*3/uL (ref 150–400)
Platelets: 257 10*3/uL (ref 150–400)
RBC: 3.79 MIL/uL — AB (ref 3.87–5.11)
RBC: 3.79 MIL/uL — ABNORMAL LOW (ref 3.87–5.11)
RDW: 14.4 % (ref 11.5–15.5)
RDW: 14.5 % (ref 11.5–15.5)
WBC: 7 10*3/uL (ref 4.0–10.5)
WBC: 7.2 10*3/uL (ref 4.0–10.5)

## 2018-02-16 LAB — HEMOGLOBIN A1C
Hgb A1c MFr Bld: 8.9 % — ABNORMAL HIGH (ref 4.8–5.6)
MEAN PLASMA GLUCOSE: 208.73 mg/dL

## 2018-02-16 LAB — ETHANOL: Alcohol, Ethyl (B): <10 mg/dL (ref <10)

## 2018-02-16 LAB — HIV ANTIBODY (ROUTINE TESTING W REFLEX): HIV Screen 4th Generation wRfx: NONREACTIVE

## 2018-02-16 LAB — I-STAT TROPONIN, ED: Troponin i, poc: 0 ng/mL (ref 0.00–0.08)

## 2018-02-16 MED ORDER — LORAZEPAM 2 MG/ML IJ SOLN
0.5000 mg | Freq: Once | INTRAMUSCULAR | Status: AC
  Administered 2018-02-16: 0.5 mg via INTRAVENOUS

## 2018-02-16 MED ORDER — ACETAMINOPHEN 160 MG/5ML PO SOLN: 650.0000 mg | ORAL | Status: DC | PRN

## 2018-02-16 MED ORDER — INSULIN ASPART 100 UNIT/ML ~~LOC~~ SOLN
0.0000 [IU] | Freq: Three times a day (TID) | SUBCUTANEOUS | Status: DC
  Administered 2018-02-16: 2 [IU] via SUBCUTANEOUS
  Administered 2018-02-16 (×2): 1 [IU] via SUBCUTANEOUS
  Administered 2018-02-17: 9 [IU] via SUBCUTANEOUS

## 2018-02-16 MED ORDER — SODIUM CHLORIDE 0.9 % IV SOLN: INTRAVENOUS | Status: AC

## 2018-02-16 MED ORDER — DIPHENHYDRAMINE HCL 50 MG/ML IJ SOLN
25.0000 mg | Freq: Once | INTRAMUSCULAR | Status: AC
  Administered 2018-02-16: 25 mg via INTRAVENOUS
  Filled 2018-02-16: qty 1

## 2018-02-16 MED ORDER — LEVETIRACETAM IN NACL 500 MG/100ML IV SOLN
500.0000 mg | Freq: Two times a day (BID) | INTRAVENOUS | Status: DC
  Administered 2018-02-16 – 2018-02-17 (×3): 500 mg via INTRAVENOUS
  Filled 2018-02-16 (×3): qty 100

## 2018-02-16 MED ORDER — LEVETIRACETAM 500 MG PO TABS: 500.0000 mg | ORAL_TABLET | Freq: Two times a day (BID) | ORAL | Status: DC

## 2018-02-16 MED ORDER — ACETAMINOPHEN 325 MG PO TABS
650.0000 mg | ORAL_TABLET | ORAL | Status: DC | PRN
  Administered 2018-02-16 – 2018-02-20 (×9): 650 mg via ORAL
  Filled 2018-02-16 (×10): qty 2

## 2018-02-16 MED ORDER — STROKE: EARLY STAGES OF RECOVERY BOOK
Freq: Once | Status: AC
  Administered 2018-02-16: 07:00:00
  Filled 2018-02-16: qty 1

## 2018-02-16 MED ORDER — FENTANYL CITRATE (PF) 100 MCG/2ML IJ SOLN
25.0000 ug | Freq: Once | INTRAMUSCULAR | Status: AC
  Administered 2018-02-16: 25 ug via INTRAVENOUS
  Filled 2018-02-16: qty 2

## 2018-02-16 MED ORDER — CILOSTAZOL 100 MG PO TABS
100.0000 mg | ORAL_TABLET | Freq: Two times a day (BID) | ORAL | Status: DC
  Administered 2018-02-16 – 2018-02-22 (×14): 100 mg via ORAL
  Filled 2018-02-16 (×3): qty 1
  Filled 2018-02-16: qty 2
  Filled 2018-02-16 (×2): qty 1
  Filled 2018-02-16 (×2): qty 2
  Filled 2018-02-16 (×4): qty 1
  Filled 2018-02-16 (×3): qty 2
  Filled 2018-02-16 (×2): qty 1
  Filled 2018-02-16 (×2): qty 2
  Filled 2018-02-16: qty 1
  Filled 2018-02-16 (×2): qty 2
  Filled 2018-02-16 (×2): qty 1
  Filled 2018-02-16: qty 2

## 2018-02-16 MED ORDER — LEVETIRACETAM IN NACL 1000 MG/100ML IV SOLN
1000.0000 mg | INTRAVENOUS | Status: AC
  Administered 2018-02-16: 1000 mg via INTRAVENOUS
  Filled 2018-02-16: qty 100

## 2018-02-16 MED ORDER — SODIUM CHLORIDE 0.9 % IV SOLN
INTRAVENOUS | Status: DC
  Administered 2018-02-16: 07:00:00 via INTRAVENOUS

## 2018-02-16 MED ORDER — ACETAMINOPHEN 650 MG RE SUPP: 650.0000 mg | RECTAL | Status: DC | PRN

## 2018-02-16 MED ORDER — KETOROLAC TROMETHAMINE 15 MG/ML IJ SOLN
15.0000 mg | Freq: Once | INTRAMUSCULAR | Status: AC
  Administered 2018-02-16: 15 mg via INTRAVENOUS
  Filled 2018-02-16: qty 1

## 2018-02-16 MED ORDER — CLOPIDOGREL BISULFATE 75 MG PO TABS
75.0000 mg | ORAL_TABLET | Freq: Every morning | ORAL | Status: DC
  Administered 2018-02-16 – 2018-02-22 (×7): 75 mg via ORAL
  Filled 2018-02-16 (×7): qty 1

## 2018-02-16 MED ORDER — INSULIN ASPART PROT & ASPART (70-30 MIX) 100 UNIT/ML ~~LOC~~ SUSP
20.0000 [IU] | Freq: Every day | SUBCUTANEOUS | Status: DC
  Administered 2018-02-16: 20 [IU] via SUBCUTANEOUS
  Filled 2018-02-16: qty 10

## 2018-02-16 MED ORDER — LEVOTHYROXINE SODIUM 88 MCG PO TABS
88.0000 ug | ORAL_TABLET | Freq: Every day | ORAL | Status: DC
  Administered 2018-02-16 – 2018-02-22 (×7): 88 ug via ORAL
  Filled 2018-02-16 (×7): qty 1

## 2018-02-16 MED ORDER — METOCLOPRAMIDE HCL 5 MG/ML IJ SOLN
10.0000 mg | Freq: Once | INTRAMUSCULAR | Status: AC
  Administered 2018-02-16: 10 mg via INTRAVENOUS
  Filled 2018-02-16: qty 2

## 2018-02-16 MED ORDER — POTASSIUM CHLORIDE CRYS ER 20 MEQ PO TBCR
40.0000 meq | EXTENDED_RELEASE_TABLET | Freq: Once | ORAL | Status: AC
  Administered 2018-02-16: 40 meq via ORAL
  Filled 2018-02-16: qty 2

## 2018-02-16 MED ORDER — HEPARIN SODIUM (PORCINE) 5000 UNIT/ML IJ SOLN
5000.0000 [IU] | Freq: Three times a day (TID) | INTRAMUSCULAR | Status: DC
  Administered 2018-02-16 – 2018-02-22 (×19): 5000 [IU] via SUBCUTANEOUS
  Filled 2018-02-16 (×19): qty 1

## 2018-02-16 MED ORDER — GEMFIBROZIL 600 MG PO TABS
600.0000 mg | ORAL_TABLET | Freq: Two times a day (BID) | ORAL | Status: DC
  Administered 2018-02-16 – 2018-02-17 (×4): 600 mg via ORAL
  Filled 2018-02-16 (×5): qty 1

## 2018-02-16 MED ORDER — LORAZEPAM 2 MG/ML IJ SOLN
INTRAMUSCULAR | Status: AC
  Filled 2018-02-16: qty 1

## 2018-02-16 MED ORDER — SODIUM CHLORIDE 0.9 % IV SOLN
1.0000 g | INTRAVENOUS | Status: AC
  Administered 2018-02-16 – 2018-02-18 (×3): 1 g via INTRAVENOUS
  Filled 2018-02-16 (×3): qty 10

## 2018-02-16 MED ORDER — SENNOSIDES-DOCUSATE SODIUM 8.6-50 MG PO TABS: 1.0000 | ORAL_TABLET | Freq: Every evening | ORAL | Status: DC | PRN

## 2018-02-16 NOTE — Progress Notes (Signed)
VASCULAR LAB PRELIMINARY  PRELIMINARY  PRELIMINARY  PRELIMINARY  Carotid duplex completed.    Preliminary report:  1-39% ICA plaquing. Vertebral artery flow is antegrade.   [name], RVT 02/16/2018, 3:19 PM

## 2018-02-16 NOTE — ED Notes (Signed)
Patient transported to MRI 

## 2018-02-16 NOTE — H&P (Signed)
Date: 02/16/2018               Patient Name:  LYNNMARIE LOVETT MRN: [id number]  DOB: [birth year] Age / Sex: 67 y.o., female   PCP: [name], MD         Medical Service: Internal Medicine Teaching Service         Attending Physician: Dr. Aldine Contes, MD    First Contact: Dr. Vickki Muff Pager: [phone]  Second Contact: Dr. Kalman Shan Pager: [phone]       After Hours (After 5p/  First Contact Pager: [phone]  weekends / holidays): Second Contact Pager: [phone]   Chief Complaint: L sided weakness  History of Present Illness:  Dinita March is 67 yo with PMH of prior CVA, HTN, T2DM, CKD stage 3, and HLD who is presenting for L sided weakness. History provided by patient and daughter who was beside. Patient states that about 1 week ago she started to have LUE and LLE weakness. She first noticed it while trying to pick up things off the floor. She also noticed some L sided facial weakness and difficulty speaking, feeling like she couldn't get words out. Symptoms seem to have fluctuated over the course of the next few days but were worrisome enough that she went to ED for evaluation. Per chart review patient had a head CT that was negative for acute stroke. The wait in the ED was too long for the patient, so she left without being evaluated by an ED provider. The patient's symptoms continued so daughter encouraged her to see PCP for evaluation. Patient went to PCP's office and patient offered admission for further workup of her symptoms but she declined admission at that time. Patient then returned home where she was developed problems with her L hand, stating that it started to become stiff and locked into a fist position. During this time the patient states that she felt like she lost control of her hand. The patient was also observed by her daughter to turn her head to the right and it appeared that her eyes deviated to the right as well (described as looking off into  distance). Patient's posturing/eye deviation lasted for 1-2 minutes per daughter report and self resolved. Patient recalled the entire incident. Daughter decided to call EMS for evaluation.   Patient denies chest pain, shortness of breath, abdominal pain, diaphoresis, nausea/vomiting, lower extremity swelling, and change in bladder habits. Endorses ongoing dysuria, increased urinary frequency which she reported to PCP the day of admission.   Upon arrival to the ED the patient had a witnessed seizure that lasted <1 minute per chart review and self resolved without medical intervention. She was afebrile, nontachycardic, saturating >92% on room air, and hypertensive to 160-190s/70-90s. EKG showed normal sinus rhythm and no new signs of ST elevation or TWI to suggest ischemia. ISTAT troponin = 0.00. CBC with WBC = 7.0 and Hgb = 10.9. BMP remarkable for glucose of 204, Cr of 1.6 (baseline 1.4-1.6), and BUN of 26. No other significant electrolyte abnormalities observed. Head CT showed changes consistent with acute infarct in the right periventricular and insular region. No acute hemorrhage or mass effect noted. Given these findings, neurology was consulted and IMTS called for admission.   Meds:  Current Meds  Medication Sig  . cilostazol (PLETAL) 100 MG tablet Take 100 mg by mouth 2 (two) times daily.  . clopidogrel (PLAVIX) 75 MG tablet TAKE 1 TABLET BY MOUTH EVERY MORNING  . gemfibrozil (LOPID)  600 MG tablet TAKE 1 TABLET BY MOUTH TWICE A DAY  . hydrochlorothiazide (HYDRODIURIL) 25 MG tablet Take 1 tablet (25 mg total) by mouth daily.  . Insulin Isophane & Regular Human (NOVOLIN 70/30 FLEXPEN) (70-30) 100 UNIT/ML PEN Inject 36 Units into the skin daily.  Marland Kitchen levothyroxine (SYNTHROID, LEVOTHROID) 88 MCG tablet TAKE 1 TABLET BY MOUTH EVERY DAY BEFORE BREAKFAST  . liraglutide 18 MG/3ML SOPN Inject 0.2 mLs (1.2 mg total) into the skin daily.  Marland Kitchen losartan (COZAAR) 100 MG tablet TAKE 1 TABLET BY MOUTH EVERY DAY    . Multiple Vitamins-Minerals (MULTIVITAMIN ADULT PO) Take 1 tablet by mouth once a week. On Wednesday   Allergies: Allergies as of 02/15/2018 - Review Complete 02/15/2018  Allergen Reaction Noted  . Insulins Rash 12/27/2014  . Statins  10/24/2017   Past Medical History: Past Medical History:  Diagnosis Date  . Anemia   . Arteriosclerotic cardiovascular disease   . Claudication in peripheral vascular disease ([place])   . CVA (cerebral vascular accident) ([place]) 01/2011   affected left side  . Diabetes mellitus 2000  . GERD (gastroesophageal reflux disease)   . Hyperlipidemia   . Hypertension   . Hypothyroidism   . Renal insufficiency   . Vitamin D deficiency    Past Surgical History: Past Surgical History:  Procedure Laterality Date  . CESAREAN SECTION    . LEFT HEART CATHETERIZATION WITH CORONARY ANGIOGRAM N/A 03/08/2012   Procedure: LEFT HEART CATHETERIZATION WITH CORONARY ANGIOGRAM;  Surgeon: Sinclair Grooms, MD;  Location: [facility] CATH LAB;  Service: Cardiovascular;  Laterality: N/A;  . LEFT HEART CATHETERIZATION WITH CORONARY ANGIOGRAM N/A 12/17/2012   Procedure: LEFT HEART CATHETERIZATION WITH CORONARY ANGIOGRAM;  Surgeon: Laverda Page, MD;  Location: [facility] CATH LAB;  Service: Cardiovascular;  Laterality: N/A;  . LOWER EXTREMITY ANGIOGRAM N/A 12/17/2012   Procedure: LOWER EXTREMITY ANGIOGRAM;  Surgeon: Laverda Page, MD;  Location: [facility] CATH LAB;  Service: Cardiovascular;  Laterality: N/A;  . TUBAL LIGATION     Family History:  Family History  Problem Relation Age of Onset  . Diabetes Brother   . Prostate cancer Brother   . Diabetes Mother   . Diabetes Father   . Diabetes Sister   . Esophageal cancer Neg Hx   . Rectal cancer Neg Hx   . Stomach cancer Neg Hx   . Colon cancer Neg Hx   . Breast cancer Neg Hx    Social History:  Social History   Tobacco Use  . Smoking status: Former Smoker    Last attempt to quit: 02/04/2011    Years since quitting: 7.0  . Smokeless  tobacco: Never Used  Substance Use Topics  . Alcohol use: No    Alcohol/week: 0.0 oz  . Drug use: No   Review of Systems: A complete ROS was negative except as per HPI.   Physical Exam: Blood pressure (!) 162/62, pulse 90, temperature 98.1 F (36.7 C), resp. rate 18, height 5\' 2"  (1.575 m), weight 165 lb (74.8 kg), SpO2 92 %.  Physical Exam  Constitutional: She is oriented to person, place, and time.  Patient laying comfortably in bed in no acute distress.  HENT:  Mouth/Throat: Oropharynx is clear and moist. No oropharyngeal exudate.  Eyes: Pupils are equal, round, and reactive to light. Conjunctivae and EOM are normal.  Cardiovascular: Normal rate, regular rhythm and intact distal pulses. Exam reveals no friction rub.  No murmur heard. Respiratory: Effort normal. No respiratory distress. She has no  wheezes. She has no rales.  GI: Soft. Bowel sounds are normal. She exhibits no distension. There is no tenderness. There is no rebound.  Musculoskeletal: She exhibits no edema (of bilateral lower extremities) or tenderness (of bilateral lower extremities).  Neurological: She is alert and oriented to person, place, and time.  Face strength and sensation intact bilaterally. Tongue midline. 5/5 bicep, tricep, and grip strength bilaterally. 5/5 dorsiflexion and plantarflexion bilaterally. Gross sensation to light touch of upper and lower extremities intact bilaterally. Finger to nose testing within normal limits. Mild pronator drift on LUE.   Skin: Skin is warm and dry. No rash noted. No erythema.  Psychiatric: She has a normal mood and affect. Her behavior is normal.   EKG: personally reviewed my interpretation is normal sinus rhythm with J point elevation in leads V1-V2, unchanged from prior EKG on 09/04/2014. New J point elevation in V3 and TWI in aVL. No other ST elevation or TWI noted.  Assessment & Plan by Problem: Active Problems:   CVA (cerebral vascular accident) [facility])  Zanyia  March is 67 yo with PMH of prior CVA, HTN, T2DM, CKD stage 3, and HLD who is presenting for L arm weakness and was found to have seizures in the setting of new acute R sided periventricular/insular stroke. The patient was admitted to the internal medicine teaching service with neurology consulting. The specific problems addressed during admission are as follows:  Acute R periventricular and insular stroke: Patient's history of LUE weakness and facial droop are consistent with acute CVA, however the patient's symptoms seem to have self resolved upon initial evaluation. The patient has a history of CVA and is already on anti-platelet therapy. She also has documented intolerance of statin, stating it makes he sick to her stomach. She has refused statin therapy on at least 3 separate visits with PCP per chart review. Will continue home gemfibrozil given this history. Will complete stroke workup as outlined below and continue aggressive risk factor modification for prevention of future events.  -Admit to telemetry -Neurology consulted, recommendations appreciated -MRI of brain w/o contrast -MRA Head -Carotid ultrasound -Echocardiography -Continue home clopidogrel and gemfibrozil 600 mg BID -Lipid panel in AM -Migraine cocktail for headache -PT/OT/SLP consult  New onset seizure: Patient had arm movements, described as L arm stiffening and head turning to the right with R eye deviation, suggestive of seizure prior to presentation and generalized seizure activity in ED. Patient denied history of previous seizures. New onset seizure activity likely 2/2 previous stroke damage and new acute stroke representing new seizure focus. Given two episodes already witnessed in the few hours leading up to admission, neurology recommended loading with Keppra and continuing this medication for seizure prophylaxis during workup for stroke. -Continue Keppra 500 mg BID  UTI: Patient empirically started on bactrim for  treatment of uncomplicated UTI by [facility] physician at visit on 02/15/2018. Patient has yet to start this medication since she was just prescribed it earlier in the day, so will transition to ceftriaxone while inpatient given ongoing symptoms of dysuria and increased urinary frequency.  -Urinalysis and culture -Ceftriaxone 1g/24 hours x3 days  HTN: Current BP 160/62. Patient on HCTZ 25 mg and losartan 100 mg daily as outpatient. Will hold -Resume home medications in 24 hours, after window for permissive HTN  T2DM: Hemoglobin A1C on 01/22/2018 = 8.3%, which is above goal of <7% and indicates moderate control at baseline. Per chart review patient takes 36 units Novolin 70/30 and 1.2 mg liraglutide.  -CBG TID  with meals, qHS -SSI TID with meals -70/30 20 units daily  CKD Stage 3: Cr at baseline of 1.4-1.6. Continue to monitor while inpatient.  FEN/GI: -NPO pending swallow evaluation, Heart Healthy/Carb Modified diet thereafter -NS @ rate of 50 ml/hr, replace electrolytes as needed  VTE Prophylaxis: Heparin TID Code Status: Full  Dispo: Admit patient to Inpatient with expected length of stay greater than 2 midnights.  SignedThomasene Ripple, MD 02/16/2018, 1:35 AM  Pager: [phone]

## 2018-02-16 NOTE — Progress Notes (Signed)
STROKE TEAM PROGRESS NOTE   HISTORY OF PRESENT ILLNESS (per record) GESELLE [name] is a 67 y.o. female past medical history of peripheral vascular disease, stroke x2 affecting the left side with no residual deficits, hypertension, diabetes, hyperlipidemia who was in her usual state of health about a week ago when she started noticing some intermittent left-sided weakness.  Her left-sided weakness progressed to become worse over the course of the week.  She came to the emergency room once on Sunday but due to the long wait times left without being seen. She was brought in today again because the daughter at bedside told me that she has been having progressive weakness as well as multiple episodes of what looked like generalized tonic-clonic seizures.  Next line she had one episode of generalized tonic-clonic seizure this evening which prompted the EMS call to bring her to the hospital. She had another seizure lasting about 45 seconds with initially her gaze to the left and then lasting 45 seconds and the gaze then coming back more towards the right as described by the nurse. Patient also complains of a new headache.  She says she does not have migraines and this headache has been ongoing now for a week. Denies any fevers but reports chills.  Reports nasal congestion and congestive symptoms. Denies chest pain or shortness of breath.  Denies palpitations.  Denies history of irregular heart rate. Reports compliance to medications. Currently on Plavix as an antiplatelet.  Was on aspirin but had persistent gum bleeding for which was stopped by her primary care.  LKW: Many days ago. tpa given?: no, outside the window Premorbid modified Rankin scale (mRS): 1     SUBJECTIVE (INTERVAL HISTORY) No family members present. The pt is lethargic and difficult to arouse. She withdraws to painful stimuli. Possibly post ictal or possibly secondary to medications received. The patients nurse was instructed to  avoid sedating medications and change Keppra to IV. May need to give aspirin as a suppository if unable to swallow. EEG pending.    OBJECTIVE Temp:  [97.9 F (36.6 C)-98.6 F (37 C)] 98.3 F (36.8 C) (04/06 0745) Pulse Rate:  [82-100] 94 (04/06 0745) Cardiac Rhythm: Normal sinus rhythm;Heart block (04/06 0700) Resp:  [10-22] 20 (04/06 0745) BP: (131-191)/(53-92) 139/65 (04/06 0745) SpO2:  [92 %-100 %] 96 % (04/06 0745) Weight:  [164 lb 6.4 oz (74.6 kg)-167 lb 8.8 oz (76 kg)] 167 lb 8.8 oz (76 kg) (04/06 0400)  CBC:  Recent Labs  Lab 02/10/18 2024 02/15/18 2328 02/15/18 2353 02/16/18 0200  WBC 5.5 7.0  --  7.2  NEUTROABS 2.4 4.4  --   --   HGB 10.9* 10.9* 11.9* 10.6*  HCT 33.7* 33.8* 35.0* 33.6*  MCV 88.2 89.2  --  88.7  PLT 281 275  --  086    Basic Metabolic Panel:  Recent Labs  Lab 02/15/18 2328 02/15/18 2353 02/16/18 0200  NA 138 139 135  K 3.9 4.0 3.4*  CL 100* 101 97*  CO2 24  --  23  GLUCOSE 204* 200* 220*  BUN 26* 31* 26*  CREATININE 1.60* 1.50* 1.46*  CALCIUM 9.9  --  9.5    Lipid Panel:     Component Value Date/Time   CHOL 168 02/16/2018 0200   CHOL 200 (H) 10/18/2016 1518   TRIG 255 (H) 02/16/2018 0200   HDL 30 (L) 02/16/2018 0200   HDL 34 (L) 10/18/2016 1518   CHOLHDL 5.6 02/16/2018 0200   VLDL 51 (  H) 02/16/2018 0200   LDLCALC 87 02/16/2018 0200   LDLCALC 99 10/18/2016 1518   HgbA1c:  Lab Results  Component Value Date   HGBA1C 8.9 (H) 02/16/2018   Urine Drug Screen: No results found for: LABOPIA, COCAINSCRNUR, LABBENZ, AMPHETMU, THCU, LABBARB  Alcohol Level     Component Value Date/Time   ETH <10 02/15/2018 2328    IMAGING   Ct Head Wo Contrast 02/16/2018 IMPRESSION:  Suggestion of acute infarct in the right periventricular and insular region. Consider MRI correlation if clinically indicated. No acute intracranial hemorrhage or mass effect.     Mr Brain Wo Contrast 02/16/2018 IMPRESSION:    MRI head:  1. Subcentimeter  acute/early subacute infarction and right mid corona radiata with few adjacent punctate foci. No associated hemorrhage or mass effect.  2. Mild chronic microvascular ischemic changes and mild parenchymal volume loss of the brain.  3. Small chronic infarcts in right genu of corpus callosum, right body of corpus callosum, and right hemi pons.    MRA head:  Severe motion artifact. Suboptimal assessment for stenosis or small aneurysm. No large vessel occlusion or large aneurysm identified.    Transthoracic Echocardiogram - pending 00/00/00    Bilateral Carotid Dopplers - pending 00/00/00     PHYSICAL EXAM Vitals:   02/16/18 0145 02/16/18 0200 02/16/18 0400 02/16/18 0745  BP:  (!) 176/59 131/76 139/65  Pulse: 100 97 97 94  Resp: (!) 21 17 (!) 22 20  Temp:   97.9 F (36.6 C) 98.3 F (36.8 C)  TempSrc:   Oral Axillary  SpO2: 96% 96% 97% 96%  Weight:   167 lb 8.8 oz (76 kg)   Height:         middle aged african [place] lady who is sleepy and hard to arouse . Afebrile. Head is nontraumatic. Neck is supple without bruit.    Cardiac exam no murmur or gallop. Lungs are clear to auscultation. Distal pulses are well felt.  Neurological Exam ;  Lethargic. Arouses with difficulty and not cooperative for exam/eye movements full without nystagmus.fundi were not visualized. Vision acuity and fields appear normal. Hearing is normal. Palatal movements are normal. Face symmetric. Tongue midline. Able to move right side well against gravity but appears to have left sided weakness but not cooperative for detailed exam. Normal sensation. Gait deferred.          ASSESSMENT/PLAN [name] is a 66 y.o. female with history of peripheral vascular disease, stroke x2 affecting the left side with no residual deficits, hypertension, diabetes, hyperlipidemia  presenting with left-sided weakness, tonic clonic seizures, and a new headache. She did not receive IV t-PA due to late  presentation.  Stroke:  subacute infarction and right mid corona radiata to small vessel disease.  Resultant   Mild left hemiplegia  CT head - Suggestion of acute infarct in the right periventricular and insular region.  MRI head - Subcentimeter acute/early subacute infarction and right mid corona radiata with few adjacent punctate foci with multiple old infarcts.  MRA head - motion degraded but otherwise unremarkable  UDS - pending  EEG - pending  Carotid Doppler - pending  2D Echo - pending  LDL - 99  HgbA1c - 8.9  VTE prophylaxis - [place] heparin  Diet NPO time specified  Fall precautions  Seizure precautions  clopidogrel 75 mg daily and Pletal prior to admission, now on clopidogrel 75 mg daily and Pletal  Patient will be counseled to be compliant with her antithrombotic  medications  Ongoing aggressive stroke risk factor management  Therapy recommendations:  pending  Disposition:  Pending  Hypertension  Stable  Permissive hypertension (OK if < 220/120) but gradually normalize in 5-7 days  Long-term BP goal normotensive  Hyperlipidemia  Lipid lowering medication PTA:  Intolerant to statins  LDL 99, goal < 70  Current lipid lowering medication: none  Continue statin at discharge  Diabetes  HgbA1c 8.9, goal < 7.0  Uncontrolled  Other Stroke Risk Factors  Advanced age  Former cigarette smoker - quit 7 years ago.  Obesity, Body mass index is 30.65 kg/m., recommend weight loss, diet and exercise as appropriate   Hx stroke/TIA  Coronary artery disease  Other Active Problems  UTI -IV Rocephin  Mild hypokalemia  Mild anemia  BUN 26 ; creatinine 1.46  New-onset seizures -> Keppra - EEG pending   Plan / Recommendations   Stroke workup: awaiting carotid Dopplers and 2D echo  Therapy Follow Up: pending  Disposition: pending  Antiplatelet / Anticoagulation: Currently on Plavix and Pletal.  Consider aspirin suppository if too  lethargic to swallow medications.  Statin: Intolerant  MD Follow Up: pending  Other: Await EEG and UDS  Further risk factor modification per primary care MD: Follow Up 2 weeks   Hospital day # 0  Mikey Bussing PA-C [facility] Pager [phone] 02/16/2018, 11:38 AM I have personally examined this patient, reviewed notes, independently viewed imaging studies, participated in medical decision making and plan of care.ROS completed by me personally and pertinent positives fully documented  I have made any additions or clarifications directly to the above note. Agree with note above. Patient presented with generalized seizure and appears to have a incidental right subcortical lacunar infarct. Continue keppra for seizures but avoid sedation. Continue ongoing stroke w/u. No family available for discussion at bedside. D/w RN at bedside.I spent  25  minutes in total face-to-face time with the patient, more than 50% of which was spent in counseling and coordination of care, reviewing test results, reviewing medication and discussing or reviewing the diagnosis of  Seizure, stroke   , the prognosis and treatment options.   Antony Contras, MD Medical Director [place] Pager: [phone] 02/16/2018 11:47 AM   To contact Stroke Continuity provider, please refer to [email or web address] After hours, contact General Neurology

## 2018-02-16 NOTE — Progress Notes (Signed)
PT Cancellation Note  Patient Details Name: [name] MRN: [id number] DOB: August 13, 1951   Cancelled Treatment:    Reason Eval/Treat Not Completed: Other (comment).  Pt is able to respond verbally with groans but not able to wake her up to move.  Will try again when pt is more alert and able to participate in therapy.   Ramond Dial 02/16/2018, 11:16 AM  Mee Hives, PT MS Acute Rehab Dept. Number: [place] [facility] [place]

## 2018-02-16 NOTE — Progress Notes (Signed)
   Subjective: Patient was evaluated this morning on rounds. She was somnolent on exam. She was able to open her eyes to verbal command, however quickly went to sleep.  Objective:  Vital signs in last 24 hours: Vitals:   02/16/18 0145 02/16/18 0200 02/16/18 0400 02/16/18 0745  BP:  (!) 176/59 131/76 139/65  Pulse: 100 97 97 94  Resp: (!) 21 17 (!) 22 20  Temp:   97.9 F (36.6 C) 98.3 F (36.8 C)  TempSrc:   Oral Axillary  SpO2: 96% 96% 97% 96%  Weight:   167 lb 8.8 oz (76 kg)   Height:       Physical Exam  Constitutional: She is well-developed, well-nourished, and in no distress.  Lethargic   Eyes:  Pin point pupils but equal round and reactive to light  Cardiovascular: Normal rate, regular rhythm and normal heart sounds. Exam reveals no gallop and no friction rub.  No murmur heard. Pulmonary/Chest: Effort normal and breath sounds normal. No respiratory distress. She has no wheezes. She has no rales.  Abdominal: Soft. She exhibits no distension.  Musculoskeletal: She exhibits no edema.  Skin: Skin is warm and dry.     Assessment/Plan:  Active Problems:   Hypothyroidism   Diabetes mellitus with stage 3 chronic kidney disease (HCC)   Hyperlipidemia   HYPERTENSION, BENIGN SYSTEMIC   PVD (peripheral vascular disease) (HCC)   CKD (chronic kidney disease), stage III (HCC)   UTI (urinary tract infection)   CVA (cerebral vascular accident) ([place])  CVA Patient had an MRI head showing subcentimeter acute/early subacute infarction in the right mid corona radiata with a few adjacent punctate foci. No hemorrhage or mass effect noted.  MRA head with no large vessel occlusion or large aneurysm identified.  Neurology consulted and completing stroke workup.   -Allow for permissive hypertension for the first 24-48h - only treat PRN if SBP >220 mmHg -Carotid dopplers -Echocardiogram -Frequent neuro checks -continue with Plavix 75 mg PO daily -Atorvastatin 80 mg PO daily -PT consult,  OT consult, Speech consult  Seizure No prior history of seizure, had multiple witnessed seizures yesterday and was loaded with keppra and continued with keppra 500mg  BID.  Getting EEG. -EEG - IV keppra 500mg  BID -Neurology following  Somnolence Patient received fentanyl and Ativan this morning. Somnolence on exam could be due to medications versus recurrent seizure with postictal state.  Will avoid sedating medications at this time. Patient is to have an EEG. -EEG -Avoid sedating medications -neurology following  Urinary retention 800 mL per bladder scan. In and out cath ordered.  If patient requires another in and out Cath can place Foley. - in an out cath  UTI Was being treated outpatient on 4/5 with Bactrim. While inpatient she was started on ceftriaxone. -Ceftriaxone - UA and Urine culture pending  Hypertension Currently normotensive. Holding home blood pressure medications allowing for permissive hypertension -holding BP meds -Can treat PRN if SBP > 220  T2DM HgbA1C 8.9 -CBG TID with meals, qHS -SSI TID with meals -70/30 20 units daily  CKD Stage 3: Cr at baseline of 1.4-1.6. Continue to monitor while inpatient.  FEN/GI: -NPO pending swallow evaluation, Heart Healthy/Carb Modified diet thereafter -NS @ rate of 50 ml/hr, replace electrolytes as needed  VTE Prophylaxis: Heparin TID Code Status: Full   Dispo: Anticipated discharge in approximately 2 day(s), pending clinical improvement.   Kalman Shan [place], DO 02/16/2018, 10:02 AM Pager: [phone]

## 2018-02-16 NOTE — Progress Notes (Signed)
OT Cancellation Note  Patient Details Name: [name] MRN: [id number] DOB: May 10, 1951   Cancelled Treatment:    Reason Eval/Treat Not Completed: Fatigue/lethargy limiting ability to participate  Benito Mccreedy OTR/L 02/16/2018, 10:02 AM

## 2018-02-16 NOTE — Progress Notes (Signed)
Pt. Remained drowsy and not following command. Will continue to monitor.

## 2018-02-16 NOTE — Consult Note (Signed)
Neurology Consultation  Reason for Consult: Stroke and seizure Referring Physician: Dr. Christy Gentles  CC: Left-sided weakness, headache, seizure  History is obtained from:  HPI: [name] is a 67 y.o. female past medical history of peripheral vascular disease, stroke x2 affecting the left side with no residual deficits, hypertension, diabetes, hyperlipidemia who was in her usual state of health about a week ago when she started noticing some intermittent left-sided weakness.  Her left-sided weakness progressed to become worse over the course of the week.  She came to the emergency room once on Sunday but due to the long wait times left without being seen. She was brought in today again because the daughter at bedside told me that she has been having progressive weakness as well as multiple episodes of what looked like generalized tonic-clonic seizures.  Next line she had one episode of generalized tonic-clonic seizure this evening which prompted the EMS call to bring her to the hospital. She had another seizure lasting about 45 seconds with initially her gaze to the left and then lasting 45 seconds and the gaze then coming back more towards the right as described by the nurse. Patient also complains of a new headache.  She says she does not have migraines and this headache has been ongoing now for a week. Denies any fevers but reports chills.  Reports nasal congestion and congestive symptoms. Denies chest pain or shortness of breath.  Denies palpitations.  Denies history of irregular heart rate. Reports compliance to medications. Currently on Plavix as an antiplatelet.  Was on aspirin but had persistent gum bleeding for which was stopped by her primary care.  LKW: Many days ago. tpa given?: no, outside the window Premorbid modified Rankin scale (mRS): 1   ROS: ROS was performed and is negative except as noted in the HPI.   Past Medical History:  Diagnosis Date  . Anemia   .  Arteriosclerotic cardiovascular disease   . Claudication in peripheral vascular disease ([place])   . CVA (cerebral vascular accident) ([place]) 01/2011   affected left side  . Diabetes mellitus 2000  . GERD (gastroesophageal reflux disease)   . Hyperlipidemia   . Hypertension   . Hypothyroidism   . Renal insufficiency   . Vitamin D deficiency     Family History  Problem Relation Age of Onset  . Diabetes Brother   . Prostate cancer Brother   . Diabetes Mother   . Diabetes Father   . Diabetes Sister   . Esophageal cancer Neg Hx   . Rectal cancer Neg Hx   . Stomach cancer Neg Hx   . Colon cancer Neg Hx   . Breast cancer Neg Hx    Social History:   reports that she quit smoking about 7 years ago. She has never used smokeless tobacco. She reports that she does not drink alcohol or use drugs.  Medications  Current Facility-Administered Medications:  .  levETIRAcetam (KEPPRA) IVPB 1000 mg/100 mL premix, 1,000 mg, Intravenous, STAT, Amie Portland, MD  Current Outpatient Medications:  .  cilostazol (PLETAL) 100 MG tablet, Take 100 mg by mouth 2 (two) times daily., Disp: , Rfl:  .  clopidogrel (PLAVIX) 75 MG tablet, TAKE 1 TABLET BY MOUTH EVERY MORNING, Disp: 90 tablet, Rfl: 3 .  gemfibrozil (LOPID) 600 MG tablet, TAKE 1 TABLET BY MOUTH TWICE A DAY, Disp: 60 tablet, Rfl: 5 .  hydrochlorothiazide (HYDRODIURIL) 25 MG tablet, Take 1 tablet (25 mg total) by mouth daily., Disp: 90 tablet,  Rfl: 3 .  Insulin Isophane & Regular Human (NOVOLIN 70/30 FLEXPEN) (70-30) 100 UNIT/ML PEN, Inject 36 Units into the skin daily., Disp: 15 mL, Rfl: 8 .  levothyroxine (SYNTHROID, LEVOTHROID) 88 MCG tablet, TAKE 1 TABLET BY MOUTH EVERY DAY BEFORE BREAKFAST, Disp: 90 tablet, Rfl: 3 .  liraglutide 18 MG/3ML SOPN, Inject 0.2 mLs (1.2 mg total) into the skin daily., Disp: 6 mL, Rfl: 3 .  losartan (COZAAR) 100 MG tablet, TAKE 1 TABLET BY MOUTH EVERY DAY, Disp: 90 tablet, Rfl: 3 .  Multiple Vitamins-Minerals  (MULTIVITAMIN ADULT PO), Take 1 tablet by mouth once a week. On Wednesday, Disp: , Rfl:  .  BD INSULIN SYRINGE ULTRAFINE 31G X 15/64" 0.5 ML MISC, USE TO INJECT INSULIN TWO TIMES A DAY, Disp: 100 each, Rfl: 11 .  glucose blood (ACCU-CHEK AVIVA PLUS) test strip, Use to check blood sugars twice a day.Dx Code: E11.22., Disp: 100 each, Rfl: 4 .  Insulin Pen Needle (B-D UF III MINI PEN NEEDLES) 31G X 5 MM MISC, The patient is insulin requiring, ICD 10 code E11.9. The patient injects insulin 2 times per day., Disp: 100 each, Rfl: 2 .  ranitidine (ZANTAC) 150 MG tablet, TAKE 1 TABLET BY MOUTH EVERY DAY AS NEEDED (Patient not taking: Reported on 02/16/2018), Disp: 30 tablet, Rfl: 4 .  sulfamethoxazole-trimethoprim (BACTRIM DS,SEPTRA DS) 800-160 MG tablet, Take 1 tablet by mouth 2 (two) times daily for 3 days., Disp: 6 tablet, Rfl: 0  Exam: Current vital signs: BP (!) 162/62   Pulse 90   Temp 98.6 F (37 C) (Oral)   Resp 18   Ht 5\' 2"  (1.575 m)   Wt 74.8 kg (165 lb)   SpO2 92%   BMI 30.18 kg/m  Vital signs in last 24 hours: Temp:  [98 F (36.7 C)-98.6 F (37 C)] 98.6 F (37 C) (04/05 2253) Pulse Rate:  [82-93] 90 (04/06 0030) Resp:  [10-22] 18 (04/06 0030) BP: (143-191)/(53-92) 162/62 (04/06 0030) SpO2:  [92 %-100 %] 92 % (04/06 0030) Weight:  [74.6 kg (164 lb 6.4 oz)-74.8 kg (165 lb)] 74.8 kg (165 lb) (04/05 2310)  General: Awake alert oriented x3 HEENT: Normocephalic atraumatic CVS: F0-X3 regular rate rhythm Respiratory: Chest clear to auscultation Abdomen: Nondistended nontender Extremities: Warm well perfused no edema Neurological exam Awake alert oriented x3 Speech is clear Naming, attention repetition intact Cranial nerves: Pupils equal round reactive light, extra movements intact, facial sensation intact bilaterally, face symmetric, palate elevates in the midline, shoulder shrug intact, tongue midline. Motor exam: Right upper extremity 5/5.  Left upper extremity 4+/5 right lower  and left lower extremity 5/5.  Normal tone.  Normal range of motion. Sensory exam: Intact light touch with no extinction Coordination: Intact finger nose finger bilaterally Gait testing was deferred at this time for patient comfort NIH stroke scale-0   Labs I have reviewed labs in epic and the results pertinent to this consultation are:  CBC    Component Value Date/Time   WBC 7.0 02/15/2018 2328   RBC 3.79 (L) 02/15/2018 2328   HGB 11.9 (L) 02/15/2018 2353   HGB 11.4 07/25/2017 1406   HCT 35.0 (L) 02/15/2018 2353   HCT 35.3 07/25/2017 1406   PLT 275 02/15/2018 2328   PLT 302 07/25/2017 1406   MCV 89.2 02/15/2018 2328   MCV 88 07/25/2017 1406   MCH 28.8 02/15/2018 2328   MCHC 32.2 02/15/2018 2328   RDW 14.4 02/15/2018 2328   RDW 15.3 07/25/2017 1406  LYMPHSABS 2.0 02/15/2018 2328   MONOABS 0.4 02/15/2018 2328   EOSABS 0.2 02/15/2018 2328   BASOSABS 0.0 02/15/2018 2328    CMP     Component Value Date/Time   NA 139 02/15/2018 2353   NA 144 07/25/2017 1406   K 4.0 02/15/2018 2353   CL 101 02/15/2018 2353   CO2 24 02/15/2018 2328   GLUCOSE 200 (H) 02/15/2018 2353   BUN 31 (H) 02/15/2018 2353   BUN 29 (H) 07/25/2017 1406   CREATININE 1.50 (H) 02/15/2018 2353   CALCIUM 9.9 02/15/2018 2328   PROT 8.6 (H) 02/15/2018 2328   ALBUMIN 4.2 02/15/2018 2328   AST 32 02/15/2018 2328   ALT 22 02/15/2018 2328   ALKPHOS 55 02/15/2018 2328   BILITOT 0.5 02/15/2018 2328   GFRNONAA 33 (L) 02/15/2018 2328   GFRAA 38 (L) 02/15/2018 2328    Lipid Panel     Component Value Date/Time   CHOL 200 (H) 10/18/2016 1518   TRIG 337 (H) 10/18/2016 1518   HDL 34 (L) 10/18/2016 1518   CHOLHDL 5.9 (H) 10/18/2016 1518   CHOLHDL 6.0 06/01/2015 1523   VLDL NOT CALC 06/01/2015 1523   LDLCALC 99 10/18/2016 1518   LDLDIRECT 56 06/01/2015 1523   Imaging I have reviewed the images obtained  CT-scan of the brain -evolving infarct in the right insula and right periventricular area, new since  head CT from 02/10/2018  Assessment:  67 year old African-American woman past history of peripheral vascular disease, old strokes without residual deficits, hypertension, diabetes and hyperlipidemia presenting for symptoms of left-sided weakness since the past 1 week. Brought into the emergency room for evaluation of what the family described as generalized tonic-clonic seizure, of which she has had multiple episodes over the past week as well. She had one clinical episode in the emergency room witnessed by the patient's RN at bedside as well. At the time of my encounter, she was back to her baseline with no focal residual deficits. It does look like on the review of her noncontrast CT of the head that she has involving infarct in the right insula and right periventricular area, but she is outside the window for IV TPA.  She does not have any cortical signs other than the seizure, and the timeline for any endovascular intervention would also not make sense as her symptoms have been ongoing for days now. As far as her seizures are concerned, given the multiplicity of events and abnormal imaging, I would start antiepileptics. Her headaches, which are new, less likely to be migrainous as it is unlikely that she has new onset migraines at this age.  I would expect that these are probably associated with her current neurological presentation of seizures.  Symptomatic treatment only for now.  Impression: Acute ischemic stroke-small vessel versus cardio embolic Seizure Diabetes Hypertension Hyperlipidemia Headache- less likely migrainous given no history of migraines in the past  Recommendations:  Stroke/TIA w/u -Admit to hospitalist -Telemetry monitoring -Allow for permissive hypertension for the first 24-48h - only treat PRN if SBP >220 mmHg. Blood pressures can be gradually normalized to SBP<140 upon discharge. -MRI brain without contrast -MRA head without contrast -Carotid  dopplers -Echocardiogram -HgbA1c, fasting lipid panel -Frequent neuro checks -Prophylactic therapy-Antiplatelet med: continue with Plavix 75 mg PO daily. Had persistent gum bleeds with ASA, so PCP stopped ASA.  Also on cilostazol.  Continue with home dose. -Atorvastatin 80 mg PO daily -Risk factor modification -PT consult, OT consult, Speech consult -If Afib found on  telemetry, will need anticoagulation. Decision pending imaging and stroke team rounding.  Seizure: Under normal circumstances we would not recommend antibiotics for first time seizure, but her history suggestive of multiple seizures and she has a structural lesion on the right side although subcortical, would still recommend starting antiepileptic. Loaded with Keppra 1 g IV x1 Continue Keppra 500 twice daily Maintain seizure precautions Routine EEG in the morning  Headache: Can give 1 dose of Toradol Benadryl Reglan IV Tylenol as needed IV fluids  Please page stroke NP/PA/MD (listed on AMION)  from 8am-4 pm as this patient will be followed by the stroke team at this point.  -- Amie Portland, MD [facility] Pager: [phone] If 7pm to 7am, please call on call as listed on AMION.

## 2018-02-16 NOTE — Progress Notes (Signed)
Patient  Brought in to 3 W06 via stretcher from MRI by EMT . Pt  confused and restless throwing up all extremities in the air . Transfer to bed with max  Assist. .  cardiac monitor  Box #3 W 06  place d and verification done  With CCMT. Sinus tach on the monitor rat 106. Pt very drowsy.  Speech slurred oriented to name and month. But disoriented with day and place .  Reoriented to .Place and room. ,redirectied frequently to stay calm in bed. .Fall and seizure precaution in place.. RN will continue to monitor pt closely, No seizure at present but noted pt in post ichtal phase. ,very restlessre and confused.

## 2018-02-17 ENCOUNTER — Inpatient Hospital Stay (HOSPITAL_COMMUNITY): Payer: Medicare HMO

## 2018-02-17 DIAGNOSIS — I10 Essential (primary) hypertension: Secondary | ICD-10-CM

## 2018-02-17 DIAGNOSIS — G459 Transient cerebral ischemic attack, unspecified: Secondary | ICD-10-CM

## 2018-02-17 DIAGNOSIS — I6300 Cerebral infarction due to thrombosis of unspecified precerebral artery: Secondary | ICD-10-CM

## 2018-02-17 LAB — BASIC METABOLIC PANEL
Anion gap: 14 (ref 5–15)
BUN: 23 mg/dL — AB (ref 6–20)
CHLORIDE: 100 mmol/L — AB (ref 101–111)
CO2: 22 mmol/L (ref 22–32)
CREATININE: 1.71 mg/dL — AB (ref 0.44–1.00)
Calcium: 8.9 mg/dL (ref 8.9–10.3)
GFR calc Af Amer: 35 mL/min — ABNORMAL LOW (ref >60)
GFR calc non Af Amer: 30 mL/min — ABNORMAL LOW (ref >60)
GLUCOSE: 308 mg/dL — AB (ref 65–99)
Potassium: 3.9 mmol/L (ref 3.5–5.1)
SODIUM: 136 mmol/L (ref 135–145)

## 2018-02-17 LAB — CBC
HCT: 31.7 % — ABNORMAL LOW (ref 36.0–46.0)
Hemoglobin: 10.2 g/dL — ABNORMAL LOW (ref 12.0–15.0)
MCH: 28.7 pg (ref 26.0–34.0)
MCHC: 32.2 g/dL (ref 30.0–36.0)
MCV: 89 fL (ref 78.0–100.0)
PLATELETS: 278 10*3/uL (ref 150–400)
RBC: 3.56 MIL/uL — ABNORMAL LOW (ref 3.87–5.11)
RDW: 14.3 % (ref 11.5–15.5)
WBC: 5.6 10*3/uL (ref 4.0–10.5)

## 2018-02-17 LAB — GLUCOSE, CAPILLARY
GLUCOSE-CAPILLARY: 318 mg/dL — AB (ref 65–99)
GLUCOSE-CAPILLARY: 140 mg/dL — AB (ref 65–99)
Glucose-Capillary: 360 mg/dL — ABNORMAL HIGH (ref 65–99)
Glucose-Capillary: 349 mg/dL — ABNORMAL HIGH (ref 65–99)

## 2018-02-17 LAB — ECHOCARDIOGRAM COMPLETE
HEIGHTINCHES: 62 in
WEIGHTICAEL: 2680.79 [oz_av]

## 2018-02-17 MED ORDER — LEVETIRACETAM IN NACL 1000 MG/100ML IV SOLN: 1000.0000 mg | Freq: Two times a day (BID) | INTRAVENOUS | Status: DC

## 2018-02-17 MED ORDER — INSULIN ASPART PROT & ASPART (70-30 MIX) 100 UNIT/ML ~~LOC~~ SUSP
20.0000 [IU] | Freq: Two times a day (BID) | SUBCUTANEOUS | Status: DC
  Administered 2018-02-17: 20 [IU] via SUBCUTANEOUS
  Filled 2018-02-17: qty 10

## 2018-02-17 MED ORDER — PHENYTOIN SODIUM EXTENDED 100 MG PO CAPS: 100.0000 mg | ORAL_CAPSULE | Freq: Three times a day (TID) | ORAL | Status: DC

## 2018-02-17 MED ORDER — SODIUM CHLORIDE 0.9 % IV SOLN
750.0000 mg | Freq: Two times a day (BID) | INTRAVENOUS | Status: DC
  Filled 2018-02-17: qty 7.5

## 2018-02-17 MED ORDER — VALPROATE SODIUM 500 MG/5ML IV SOLN
1000.0000 mg | Freq: Once | INTRAVENOUS | Status: AC
  Administered 2018-02-17: 1000 mg via INTRAVENOUS
  Filled 2018-02-17: qty 10

## 2018-02-17 MED ORDER — DIVALPROEX SODIUM 500 MG PO DR TAB
500.0000 mg | DELAYED_RELEASE_TABLET | Freq: Two times a day (BID) | ORAL | Status: DC
  Administered 2018-02-18 – 2018-02-19 (×3): 500 mg via ORAL
  Filled 2018-02-17: qty 2
  Filled 2018-02-17 (×2): qty 1
  Filled 2018-02-17: qty 2
  Filled 2018-02-17: qty 1

## 2018-02-17 MED ORDER — DIPHENHYDRAMINE HCL 50 MG/ML IJ SOLN
25.0000 mg | Freq: Once | INTRAMUSCULAR | Status: AC
  Administered 2018-02-17: 25 mg via INTRAVENOUS
  Filled 2018-02-17: qty 1

## 2018-02-17 MED ORDER — INSULIN ASPART 100 UNIT/ML ~~LOC~~ SOLN
12.0000 [IU] | Freq: Once | SUBCUTANEOUS | Status: AC
  Administered 2018-02-17: 12 [IU] via SUBCUTANEOUS

## 2018-02-17 MED ORDER — SODIUM CHLORIDE 0.9 % IV SOLN
1500.0000 mg | Freq: Once | INTRAVENOUS | Status: AC
  Administered 2018-02-17: 1500 mg via INTRAVENOUS
  Filled 2018-02-17: qty 30

## 2018-02-17 MED ORDER — LEVETIRACETAM IN NACL 1000 MG/100ML IV SOLN
1000.0000 mg | Freq: Once | INTRAVENOUS | Status: DC
  Filled 2018-02-17: qty 100

## 2018-02-17 MED ORDER — INSULIN ASPART PROT & ASPART (70-30 MIX) 100 UNIT/ML ~~LOC~~ SUSP
20.0000 [IU] | Freq: Every day | SUBCUTANEOUS | Status: DC
  Filled 2018-02-17: qty 10

## 2018-02-17 MED ORDER — INSULIN ASPART PROT & ASPART (70-30 MIX) 100 UNIT/ML ~~LOC~~ SUSP
28.0000 [IU] | Freq: Two times a day (BID) | SUBCUTANEOUS | Status: DC
  Administered 2018-02-17 – 2018-02-18 (×2): 28 [IU] via SUBCUTANEOUS
  Filled 2018-02-17: qty 10

## 2018-02-17 MED ORDER — SODIUM CHLORIDE 0.9 % IV SOLN
750.0000 mg | Freq: Two times a day (BID) | INTRAVENOUS | Status: DC
  Administered 2018-02-17 – 2018-02-18 (×2): 750 mg via INTRAVENOUS
  Filled 2018-02-17 (×3): qty 7.5

## 2018-02-17 MED ORDER — LEVETIRACETAM IN NACL 500 MG/100ML IV SOLN
500.0000 mg | Freq: Once | INTRAVENOUS | Status: AC
  Administered 2018-02-17: 500 mg via INTRAVENOUS
  Filled 2018-02-17: qty 100

## 2018-02-17 MED ORDER — INSULIN ASPART PROT & ASPART (70-30 MIX) 100 UNIT/ML ~~LOC~~ SUSP: 20.0000 [IU] | Freq: Two times a day (BID) | SUBCUTANEOUS | Status: DC

## 2018-02-17 NOTE — Progress Notes (Addendum)
STROKE TEAM PROGRESS NOTE    , hyperlipidemia who was in her usual state of       SUBJECTIVE (INTERVAL HISTORY) Multiple family members present. Dr Leonie Man discussed the patient's stroke and risk factor modification. He also discussed the seizure and recommended no driving for now.   OBJECTIVE Temp:  [98.2 F (36.8 C)-99.2 F (37.3 C)] 98.2 F (36.8 C) (04/07 0816) Pulse Rate:  [74-102] 87 (04/07 0816) Cardiac Rhythm: Normal sinus rhythm (04/07 0700) Resp:  [18-21] 20 (04/07 0816) BP: (116-138)/(50-80) 134/50 (04/07 0816) SpO2:  [95 %-100 %] 96 % (04/07 0816)  CBC:  Recent Labs  Lab 02/10/18 2024 02/15/18 2328 02/15/18 2353 02/16/18 0200  WBC 5.5 7.0  --  7.2  NEUTROABS 2.4 4.4  --   --   HGB 10.9* 10.9* 11.9* 10.6*  HCT 33.7* 33.8* 35.0* 33.6*  MCV 88.2 89.2  --  88.7  PLT 281 275  --  706    Basic Metabolic Panel:  Recent Labs  Lab 02/15/18 2328 02/15/18 2353 02/16/18 0200  NA 138 139 135  K 3.9 4.0 3.4*  CL 100* 101 97*  CO2 24  --  23  GLUCOSE 204* 200* 220*  BUN 26* 31* 26*  CREATININE 1.60* 1.50* 1.46*  CALCIUM 9.9  --  9.5    Lipid Panel:     Component Value Date/Time   CHOL 168 02/16/2018 0200   CHOL 200 (H) 10/18/2016 1518   TRIG 255 (H) 02/16/2018 0200   HDL 30 (L) 02/16/2018 0200   HDL 34 (L) 10/18/2016 1518   CHOLHDL 5.6 02/16/2018 0200   VLDL 51 (H) 02/16/2018 0200   LDLCALC 87 02/16/2018 0200   LDLCALC 99 10/18/2016 1518   HgbA1c:  Lab Results  Component Value Date   HGBA1C 8.9 (H) 02/16/2018   Urine Drug Screen:     Component Value Date/Time   LABOPIA NONE DETECTED 02/16/2018 1201   COCAINSCRNUR NONE DETECTED 02/16/2018 1201   LABBENZ NONE DETECTED 02/16/2018 1201   AMPHETMU NONE DETECTED 02/16/2018 1201   THCU NONE DETECTED 02/16/2018 1201   LABBARB NONE DETECTED 02/16/2018 1201    Alcohol Level     Component Value Date/Time   ETH <10 02/15/2018 2328    IMAGING   Ct Head Wo Contrast 02/16/2018 IMPRESSION:   Suggestion of acute infarct in the right periventricular and insular region. Consider MRI correlation if clinically indicated. No acute intracranial hemorrhage or mass effect.     Mr Brain Wo Contrast 02/16/2018 IMPRESSION:    MRI head:  1. Subcentimeter acute/early subacute infarction and right mid corona radiata with few adjacent punctate foci. No associated hemorrhage or mass effect.  2. Mild chronic microvascular ischemic changes and mild parenchymal volume loss of the brain.  3. Small chronic infarcts in right genu of corpus callosum, right body of corpus callosum, and right hemi pons.    MRA head:  Severe motion artifact. Suboptimal assessment for stenosis or small aneurysm. No large vessel occlusion or large aneurysm identified.    Transthoracic Echocardiogram - pending 00/00/00    Bilateral Carotid Dopplers  02/16/2018 1-39% ICA plaquing. Vertebral artery flow is antegrade.       PHYSICAL EXAM Vitals:   02/16/18 1958 02/17/18 0007 02/17/18 0351 02/17/18 0816  BP: 125/61 (!) 116/50 (!) 132/54 (!) 134/50  Pulse: 90 92 (!) 102 87  Resp: 18 (!) 21 19 20   Temp: 98.5 F (36.9 C) 99.2 F (37.3 C) 98.6 F (37 C) 98.2 F (36.8  C)  TempSrc: Oral Oral Oral Oral  SpO2: 100% 95% 98% 96%  Weight:      Height:         middle aged african [place] lady who is sleepy and hard to arouse . Afebrile. Head is nontraumatic. Neck is supple without bruit.    Cardiac exam no murmur or gallop. Lungs are clear to auscultation. Distal pulses are well felt.  Neurological Exam ;   alert interactive. Normal speech and language function.eye movements full without nystagmus.fundi were not visualized. Vision acuity and fields appear normal. Hearing is normal. Palatal movements are normal. Face symmetric. Tongue midline. Nothere. Gross symmetric equal strength in all 4 extremities.diminished fine finger movements on the left. Orbits right over left upper extremity. Mild left grip weakness.  Normal sensation. Gait deferred.          ASSESSMENT/PLAN [name] is a 67 y.o. female with history of peripheral vascular disease, stroke x2 affecting the left side with no residual deficits, hypertension, diabetes, hyperlipidemia  presenting with left-sided weakness, tonic clonic seizures, and a new headache. She did not receive IV t-PA due to late presentation.  Stroke:  subacute infarction and right mid corona radiata to small vessel disease.  Resultant   Mild left hemiplegia  CT head - Suggestion of acute infarct in the right periventricular and insular region.  MRI head - Subcentimeter acute/early subacute infarction and right mid corona radiata with few adjacent punctate foci with multiple old infarcts.  MRA head - motion degraded but otherwise unremarkable  UDS - negative  EEG - pending  Carotid Doppler - 1-39% ICA plaquing. Vertebral artery flow is antegrade.   2D Echo - pending  LDL - 99  HgbA1c - 8.9  VTE prophylaxis - [place] heparin Fall precautions Seizure precautions Diet heart healthy/carb modified Room service appropriate? Yes; Fluid consistency: Thin  clopidogrel 75 mg daily and Pletal prior to admission, now on clopidogrel 75 mg daily and Pletal  Patient will be counseled to be compliant with her antithrombotic medications  Ongoing aggressive stroke risk factor management  Therapy recommendations:  pending  Disposition:  Pending  Hypertension  Stable  Permissive hypertension (OK if < 220/120) but gradually normalize in 5-7 days  Long-term BP goal normotensive  Hyperlipidemia  Lipid lowering medication PTA:  Intolerant to statins  LDL 99, goal < 70  Current lipid lowering medication: none  Continue statin at discharge  Diabetes  HgbA1c 8.9, goal < 7.0  Uncontrolled  Other Stroke Risk Factors  Advanced age  Former cigarette smoker - quit 7 years ago.  Obesity, Body mass index is 30.65 kg/m., recommend weight loss,  diet and exercise as appropriate   Hx stroke/TIA  Coronary artery disease  Other Active Problems  UTI -IV Rocephin  Mild hypokalemia  Mild anemia  BUN 26 ; creatinine 1.46  New-onset seizures -> Keppra - EEG pending   Plan / Recommendations   Stroke workup: awaiting 2D echo  Therapy Follow Up: pending  Disposition: pending  Antiplatelet / Anticoagulation: Currently on Plavix and Pletal.  Treat with ASA 81 mg and Plavix 75 mg for three wks then ASA alone.  Statin: Intolerant  MD Follow Up: Dr Leonie Man 6 wks.  Seizure - continue Keppra. No driving until cleared by MD.  Other: Await EEG  Further risk factor modification per primary care MD: Follow Up 2 weeks   Hospital day # [place] PA-C [facility] Pager [phone] 02/17/2018, 9:47 AM  Patient presented with generalized seizure and appears to have a incidental right subcortical lacunar infarct. Continue keppra for seizures but avoid sedation. Continue ongoing stroke w/u.  Discussed with daughter and another family member at bedside. Stroke team will sign off. Kindly call for questions.ollow-up echocardiogram results Continue Keppra for seizures and antiplatelet therapy and aggressive risk factor modification. Follow-up as an outpatient in stroke clinic in 6 weeks.patient was advised not to drive for 6 months as per [facility] law due to her recent seizure.Stroke team will sign off  Antony Contras, MD Medical Director [place] Pager: [phone] 02/17/2018 10:25 AM  To contact Stroke Continuity provider, please refer to [email or web address] After hours, contact General Neurology

## 2018-02-17 NOTE — Progress Notes (Addendum)
   Subjective:  Pt alert, has slight headache, no nausea or vomiting, ate breakfast without issue this morning, reports continued left sided weakness.    Objective:  Vital signs in last 24 hours: Vitals:   02/16/18 1958 02/17/18 0007 02/17/18 0351 02/17/18 0816  BP: 125/61 (!) 116/50 (!) 132/54 (!) 134/50  Pulse: 90 92 (!) 102 87  Resp: 18 (!) 21 19 20   Temp: 98.5 F (36.9 C) 99.2 F (37.3 C) 98.6 F (37 C) 98.2 F (36.8 C)  TempSrc: Oral Oral Oral Oral  SpO2: 100% 95% 98% 96%  Weight:      Height:       Physical Exam  Constitutional: She is well-developed, well-nourished, and in no distress.  Cardiovascular: Normal rate and regular rhythm. Exam reveals no gallop and no friction rub.  Murmur (early systolic murmur 2/6) heard. Pulmonary/Chest: Effort normal and breath sounds normal. No respiratory distress. She has no wheezes. She has no rales.  Abdominal: Soft. She exhibits no distension.  Musculoskeletal: She exhibits no edema.  Neurological:  weakness on left upper and lower extremities but still able to move against gravity and light resistance, full strenght on R upper and lower extremities, facial droop of left face  Skin: Skin is warm and dry.     Assessment/Plan:  Active Problems:   Hypothyroidism   Diabetes mellitus with stage 3 chronic kidney disease (HCC)   Hyperlipidemia   HYPERTENSION, BENIGN SYSTEMIC   PVD (peripheral vascular disease) (HCC)   CKD (chronic kidney disease), stage III (HCC)   UTI (urinary tract infection)   CVA (cerebral vascular accident) ([place])  CVA Patient had an MRI head showing subcentimeter acute/early subacute infarction in the right mid corona radiata with a few adjacent punctate foci. No hemorrhage or mass effect noted.  MRA head with no large vessel occlusion or large aneurysm identified.  Neurology consulted and completing stroke workup.   -Allow for permissive hypertension for the first 24-48h - only treat PRN if SBP >220  mmHg -Carotid dopplers pending -Echocardiogram pending -Frequent neuro checks -continue with Plavix 75 mg PO daily -statin intolerant started on LOPID 600mg  BID -PT consult, OT consult, passed stroke swallow screen  Seizure No prior history of seizure, had multiple witnessed seizures yesterday and was loaded with keppra and continued with keppra 500mg  BID.  Getting EEG. -EEG pending - IV keppra 500mg  BID -Neurology following   Urinary retention 800 mL per bladder scan. In and out cath ordered.  If patient requires another in and out Cath can place Foley.  -required once yesterday evening, voided on her own since then -continue in an out cath  UTI Was being treated outpatient on 4/5 with Bactrim. While inpatient she was started on ceftriaxone. -Ceftriaxone - UA likely contaminated sample, no urine culture  Hypertension Currently normotensive. Holding home blood pressure medications allowing for permissive hypertension -holding BP meds -Can treat PRN if SBP > 220  T2DM HgbA1C 8.9  -CBG TID with meals, qHS -70/30 20 units BID WC  CKD Stage 3: Cr at baseline of 1.4-1.6.   -Continue to monitor while inpatient.   VTE Prophylaxis: Heparin TID Code Status: Full   Dispo: Anticipated discharge in approximately 2 day(s), pending clinical improvement.   [name] Roan, MD 02/17/2018, 9:42 AM Vickki Muff MD PGY-1 Internal Medicine Pager # [phone]

## 2018-02-17 NOTE — Procedures (Signed)
History: 67 year old female being evaluated for TIA  Sedation: None  Technique: This is a 21 channel routine scalp EEG performed at the bedside with bipolar and monopolar montages arranged in accordance to the international 10/20 system of electrode placement. One channel was dedicated to EKG recording.    Background: The background consists of intermixed alpha and beta activities. There is a well defined posterior dominant rhythm of 11-12 hz that attenuates with eye opening. Sleep is recorded with normal appearing structures.   Photic stimulation: Physiologic driving is not performed  EEG Abnormalities: None  Clinical Interpretation: This normal EEG is recorded in the waking and sleep state. There was no seizure or seizure predisposition recorded on this study. Please note that a normal EEG does not preclude the possibility of epilepsy.   Roland Rack, MD [facility] [phone]  If 7pm- 7am, please page neurology on call as listed in [place].

## 2018-02-17 NOTE — Progress Notes (Addendum)
ON CALL EVENT NOTE  Called by patient's RN for clinical seizure activity. Recommended the following: -Continue current dose of Keppra (maxed out due to renal function) -Dilantin 20mg /kg load now IV -c/w Dilantin 100 mg PO TID from tomorrow.  Maintain seizure precautions  Plan was communicated to Dr. Shan Levans from the primary team over the phone.  Call on-call neurohospitalist overnight as needed.  Call stroke team in the AM with any questions as needed.  -- Amie Portland, MD [facility] Pager: [phone] If 7pm to 7am, please call on call as listed on AMION.

## 2018-02-17 NOTE — Progress Notes (Signed)
EEG completed; results pending.    

## 2018-02-17 NOTE — Progress Notes (Addendum)
Pt had an episode unwitnessed seizure activity, left arm and hand cramping and uncontrollable jerking movements. Afterwards patient disoriented to time, but oriented x person, place, and situation.   MD notified

## 2018-02-17 NOTE — Progress Notes (Signed)
  Echocardiogram 2D Echocardiogram has been performed.  Scottlyn Mchaney G Bluma Buresh 02/17/2018, 3:22 PM

## 2018-02-17 NOTE — Progress Notes (Signed)
Patients' daughter concerned about seizure activity and expressed a desire for someone to be in the room 24 hours. RN explained that the patient does not meet sitter criteria but reassured her staff would be keeping a close eye on the pt. However, the daughter expressed concern that there would not be anyone to witness and time the seizures at all times. No other family members available to supervise, and the daughter must return to work. Please keep her updated on care of the patient and pertinent results.

## 2018-02-17 NOTE — Progress Notes (Signed)
Pt had episode of unwitnessed seizure activity with Left gaze preference, jerking head movements, and aphasia. Pt remained conscious throughout, AOx4 after. MD notified.

## 2018-02-17 NOTE — Progress Notes (Addendum)
Rehab Admissions Coordinator Note:  Patient was screened by Cleatrice Burke for appropriateness for an Inpatient Acute Rehab Consult per SLP and PT recommendations. At this time, we are recommending Inpatient Rehab consult. Please place an order for a consult.  Cleatrice Burke 02/17/2018, 7:16 PM  I can be reached at [phone].

## 2018-02-17 NOTE — Progress Notes (Signed)
Pt voided last night on BSCx2.

## 2018-02-17 NOTE — Evaluation (Signed)
Speech Language Pathology Evaluation Patient Details Name: [name] MRN: [id number] DOB: 06-Aug-1951 Today's Date: 02/17/2018 Time: 1017-5102 SLP Time Calculation (min) (ACUTE ONLY): 24 min  Problem List:  Patient Active Problem List   Diagnosis Date Noted  . CVA (cerebral vascular accident) ([place]) 02/16/2018  . Left arm weakness 02/07/2018  . Gingivitis 10/24/2017  . Dysphagia 01/16/2017  . GERD (gastroesophageal reflux disease) 06/01/2015  . Normocytic anemia 06/01/2015  . Healthcare maintenance 06/01/2015  . Diastolic dysfunction 58/52/7782  . Vitamin D deficiency 06/01/2015  . CKD (chronic kidney disease), stage III ([place]) 03/01/2014  . UTI (urinary tract infection) 03/01/2014  . PVD (peripheral vascular disease) ([place]) 06/18/2013  . Hypothyroidism 01/10/2007  . Hyperlipidemia 01/10/2007  . History of tobacco use 01/10/2007  . HYPERTENSION, BENIGN SYSTEMIC 01/10/2007  . History of CVA (cerebrovascular accident) 01/10/2007  . Diabetes mellitus with stage 3 chronic kidney disease ([place]) 01/10/1989   Past Medical History:  Past Medical History:  Diagnosis Date  . Anemia   . Arteriosclerotic cardiovascular disease   . Claudication in peripheral vascular disease ([place])   . CVA (cerebral vascular accident) ([place]) 01/2011   affected left side  . Diabetes mellitus 2000  . GERD (gastroesophageal reflux disease)   . Hyperlipidemia   . Hypertension   . Hypothyroidism   . Renal insufficiency   . Vitamin D deficiency    Past Surgical History:  Past Surgical History:  Procedure Laterality Date  . CESAREAN SECTION    . LEFT HEART CATHETERIZATION WITH CORONARY ANGIOGRAM N/A 03/08/2012   Procedure: LEFT HEART CATHETERIZATION WITH CORONARY ANGIOGRAM;  Surgeon: Sinclair Grooms, MD;  Location: [facility] CATH LAB;  Service: Cardiovascular;  Laterality: N/A;  . LEFT HEART CATHETERIZATION WITH CORONARY ANGIOGRAM N/A 12/17/2012   Procedure: LEFT HEART CATHETERIZATION WITH CORONARY ANGIOGRAM;   Surgeon: Laverda Page, MD;  Location: [facility] CATH LAB;  Service: Cardiovascular;  Laterality: N/A;  . LOWER EXTREMITY ANGIOGRAM N/A 12/17/2012   Procedure: LOWER EXTREMITY ANGIOGRAM;  Surgeon: Laverda Page, MD;  Location: [facility] CATH LAB;  Service: Cardiovascular;  Laterality: N/A;  . TUBAL LIGATION     HPI:  67 y.o. female admitted for HA, left sided weakness and slurred speech. Brain MR revealed acute to subacute right corona radiata infarct with a few adjacent punctate foci. PMH includes but not limited to hx of R genu CVA, DM, HTN, PVD, HLD, and GERD.   Assessment / Plan / Recommendation Clinical Impression  Pt scored 18/30 on the MOCA (score of 17 +1 for education level), indicative of cognitive impairment. She tells me that at baseline she is independent and living alone, making it more likely that this is acute impairment from CVA. Her primary areas of impairment appear to be in the areas of selective attention, working memory, intellectual/emergent awareness, and simple problem solving, for which she needed Mod-Max cues throughout testing. She is also impacted by her impulsivity, which likely contributes to several of her errors because she tries to start the task before she knew all of the instructions. Pt will benefit from additional SLP f/u to maximize functional independence and safety.    SLP Assessment  SLP Recommendation/Assessment: Patient needs continued Speech Lanaguage Pathology Services SLP Visit Diagnosis: Cognitive communication deficit (U23.536)    Follow Up Recommendations  Inpatient Rehab;24 hour supervision/assistance    Frequency and Duration min 2x/week  2 weeks      SLP Evaluation Cognition  Overall Cognitive Status: Impaired/Different from baseline Arousal/Alertness: Awake/alert Orientation Level: Oriented to  person;Oriented to place;Oriented to time Attention: Selective Selective Attention: Impaired Selective Attention Impairment: Verbal basic;Functional  basic Memory: Impaired Memory Impairment: Other (comment)(working memory) Awareness: Impaired Awareness Impairment: Intellectual impairment;Emergent impairment;Anticipatory impairment Problem Solving: Impaired Problem Solving Impairment: Functional basic;Verbal basic Behaviors: Impulsive Safety/Judgment: Impaired       Comprehension  Auditory Comprehension Overall Auditory Comprehension: Impaired Commands: Impaired Complex Commands: 25-49% accurate Interfering Components: Attention;Processing speed;Working Marine scientist    Expression Expression Primary Mode of Expression: Verbal Verbal Expression Overall Verbal Expression: Impaired Initiation: No impairment Level of Generative/Spontaneous Verbalization: Conversation Repetition: No impairment Naming: Impairment Confrontation: Within functional limits Divergent: 50-74% accurate Interfering Components: Attention   Oral / Motor  Motor Speech Overall Motor Speech: Appears within functional limits for tasks assessed(voice is mildly hoarse)   GO                    Germain Osgood 02/17/2018, 6:27 PM   Germain Osgood, M.A. CCC-SLP [phone]

## 2018-02-17 NOTE — Evaluation (Signed)
Physical Therapy Evaluation Patient Details Name: [name] MRN: [id number] DOB: 01-06-1951 Today's Date: 02/17/2018   History of Present Illness  67 y.o. female admitted for HA, left sided weakness and slurred speech. Brain MR revealed acute to subacute right corona radiata infarct with a few adjacent punctate foci. PMH includes but not limited to hx of R genu CVA, DM, HTN, PVD, HLD, and GERD.  Clinical Impression  Pt presents with overall decrease in functional mobility secondary to above including impairments listed below (see PT Problem List). Pt requires physical assist for all functional mobility at this time. Noted multiple episodes of LOB with ambulation requiring physical assistance to recover. Pt was previously independent and is motivated to return to PLOF. Pt's daughter present, interactive, and supportive throughout session. Pt to benefit from comprehensive inpatient therapies to maximize safety, functional mobility, and independence. Will continue to follow acutely and progress as tolerated.      Follow Up Recommendations CIR    Equipment Recommendations  Other (comment)(defer to next venue of care)    Recommendations for Other Services Rehab consult     Precautions / Restrictions Precautions Precautions: Fall Restrictions Weight Bearing Restrictions: No      Mobility  Bed Mobility Overal bed mobility: Needs Assistance Bed Mobility: Supine to Sit     Supine to sit: Min guard     General bed mobility comments: min guard for safety with use of bed rail  Transfers Overall transfer level: Needs assistance Equipment used: None Transfers: Sit to/from Stand Sit to Stand: Min assist         General transfer comment: min assist for safety and balance. pt unsteady and reaching for IV pole to steady herself  Ambulation/Gait Ambulation/Gait assistance: Min assist Ambulation Distance (Feet): 80 Feet Assistive device: None Gait Pattern/deviations: Step-through  pattern;Decreased stride length;Drifts right/left Gait velocity: decreased Gait velocity interpretation: Below normal speed for age/gender General Gait Details: pt with unsteady gait requring close min guard to min assist for balance. 2 episodes of LOB noted requiring min assist to prevent fall. righting reactions present but delayed. notes pt is especially unsteady with multitasking (talking) and turning  Stairs            Wheelchair Mobility    Modified Rankin (Stroke Patients Only) Modified Rankin (Stroke Patients Only) Pre-Morbid Rankin Score: No symptoms Modified Rankin: Moderately severe disability     Balance Overall balance assessment: Needs assistance Sitting-balance support: Feet supported;No upper extremity supported Sitting balance-Leahy Scale: Fair     Standing balance support: No upper extremity supported;During functional activity Standing balance-Leahy Scale: Fair Standing balance comment: able to walk without AD but very unsteady and requires external support to prevent falls                             Pertinent Vitals/Pain Pain Assessment: 0-10 Pain Score: 4  Pain Location: headache Pain Descriptors / Indicators: Headache Pain Intervention(s): Limited activity within patient's tolerance;Monitored during session;Repositioned    Home Living Family/patient expects to be discharged to:: Private residence Living Arrangements: Alone Available Help at Discharge: Friend(s);Available PRN/intermittently Type of Home: Apartment Home Access: Stairs to enter Entrance Stairs-Rails: Can reach both;Right;Left Entrance Stairs-Number of Steps: 5(3 steps in front with no rails) Home Layout: One level Home Equipment: None      Prior Function Level of Independence: Independent         Comments: history of 4 falls (pt reports thursday was most recent  fall)     Hand Dominance   Dominant Hand: Right    Extremity/Trunk Assessment   Upper  Extremity Assessment Upper Extremity Assessment: Defer to OT evaluation    Lower Extremity Assessment Lower Extremity Assessment: RLE deficits/detail;LLE deficits/detail RLE Deficits / Details: grossly 4+/5 RLE Sensation: WNL RLE Coordination: decreased gross motor LLE Deficits / Details: grossly 4/5 LLE Sensation: WNL LLE Coordination: decreased gross motor       Communication   Communication: No difficulties  Cognition Arousal/Alertness: Awake/alert Behavior During Therapy: Impulsive Overall Cognitive Status: Within Functional Limits for tasks assessed                                 General Comments: pt had to be told multiple times not to get out of bed before PT was ready. may have mildly slow processing with slight delay in command following.      General Comments General comments (skin integrity, edema, etc.): VSS. pt's daughter present and supportive throughout session    Exercises     Assessment/Plan    PT Assessment Patient needs continued PT services  PT Problem List Decreased strength;Decreased range of motion;Decreased balance;Decreased mobility;Decreased coordination;Decreased safety awareness;Decreased knowledge of precautions       PT Treatment Interventions DME instruction;Gait training;Functional mobility training;Stair training;Therapeutic activities;Therapeutic exercise;Balance training;Cognitive remediation;Neuromuscular re-education;Patient/family education    PT Goals (Current goals can be found in the Care Plan section)  Acute Rehab PT Goals Patient Stated Goal: to get better PT Goal Formulation: With patient/family Time For Goal Achievement: 03/03/18 Potential to Achieve Goals: Good    Frequency Min 4X/week   Barriers to discharge Decreased caregiver support pt lives alone    Co-evaluation               AM-PAC PT "6 Clicks" Daily Activity  Outcome Measure Difficulty turning over in bed (including adjusting  bedclothes, sheets and blankets)?: None Difficulty moving from lying on back to sitting on the side of the bed? : None Difficulty sitting down on and standing up from a chair with arms (e.g., wheelchair, bedside commode, etc,.)?: A Little Help needed moving to and from a bed to chair (including a wheelchair)?: A Little Help needed walking in hospital room?: A Little Help needed climbing 3-5 steps with a railing? : A Lot 6 Click Score: 19    End of Session Equipment Utilized During Treatment: Gait belt Activity Tolerance: Patient tolerated treatment well Patient left: in chair;with call bell/phone within reach;with chair alarm set;with family/visitor present Nurse Communication: Mobility status PT Visit Diagnosis: Unsteadiness on feet (R26.81);Other abnormalities of gait and mobility (R26.89);Muscle weakness (generalized) (M62.81);History of falling (Z91.81);Difficulty in walking, not elsewhere classified (R26.2);Other symptoms and signs involving the nervous system (R29.898)    Time: 8366-2947 PT Time Calculation (min) (ACUTE ONLY): 22 min   Charges:   PT Evaluation $PT Eval Moderate Complexity: 1 Mod     PT G Codes:        Vic Ripper, SPT   Vic Ripper 02/17/2018, 1:12 PM

## 2018-02-17 NOTE — Progress Notes (Addendum)
ON CALL NOTE  Pt reportedly developed itching as the dilantin infusion started. Also had transient LSW that resolved.  RECS: Stop dilantin infusion Give one dose of benadryl iv Give Depakote- load of 1g IV once followed by 500 BID po  -- Amie Portland, MD [facility] Pager: [phone] If 7pm to 7am, please call on call as listed on AMION.

## 2018-02-18 ENCOUNTER — Other Ambulatory Visit: Payer: Self-pay

## 2018-02-18 DIAGNOSIS — Z79899 Other long term (current) drug therapy: Secondary | ICD-10-CM

## 2018-02-18 DIAGNOSIS — I739 Peripheral vascular disease, unspecified: Secondary | ICD-10-CM

## 2018-02-18 DIAGNOSIS — I129 Hypertensive chronic kidney disease with stage 1 through stage 4 chronic kidney disease, or unspecified chronic kidney disease: Secondary | ICD-10-CM

## 2018-02-18 DIAGNOSIS — R29810 Facial weakness: Secondary | ICD-10-CM

## 2018-02-18 DIAGNOSIS — R569 Unspecified convulsions: Secondary | ICD-10-CM

## 2018-02-18 DIAGNOSIS — E782 Mixed hyperlipidemia: Secondary | ICD-10-CM

## 2018-02-18 DIAGNOSIS — I639 Cerebral infarction, unspecified: Secondary | ICD-10-CM

## 2018-02-18 DIAGNOSIS — R339 Retention of urine, unspecified: Secondary | ICD-10-CM

## 2018-02-18 DIAGNOSIS — I63411 Cerebral infarction due to embolism of right middle cerebral artery: Secondary | ICD-10-CM

## 2018-02-18 DIAGNOSIS — R011 Cardiac murmur, unspecified: Secondary | ICD-10-CM

## 2018-02-18 DIAGNOSIS — N3000 Acute cystitis without hematuria: Secondary | ICD-10-CM

## 2018-02-18 DIAGNOSIS — N390 Urinary tract infection, site not specified: Secondary | ICD-10-CM

## 2018-02-18 DIAGNOSIS — G40109 Localization-related (focal) (partial) symptomatic epilepsy and epileptic syndromes with simple partial seizures, not intractable, without status epilepticus: Secondary | ICD-10-CM

## 2018-02-18 DIAGNOSIS — G8194 Hemiplegia, unspecified affecting left nondominant side: Secondary | ICD-10-CM

## 2018-02-18 LAB — GLUCOSE, CAPILLARY
GLUCOSE-CAPILLARY: 223 mg/dL — AB (ref 65–99)
GLUCOSE-CAPILLARY: 311 mg/dL — AB (ref 65–99)
Glucose-Capillary: 96 mg/dL (ref 65–99)
Glucose-Capillary: 109 mg/dL — ABNORMAL HIGH (ref 65–99)

## 2018-02-18 LAB — CBC
HCT: 28.9 % — ABNORMAL LOW (ref 36.0–46.0)
HEMOGLOBIN: 9.6 g/dL — AB (ref 12.0–15.0)
MCH: 29 pg (ref 26.0–34.0)
MCHC: 33.2 g/dL (ref 30.0–36.0)
MCV: 87.3 fL (ref 78.0–100.0)
Platelets: 245 10*3/uL (ref 150–400)
RBC: 3.31 MIL/uL — AB (ref 3.87–5.11)
RDW: 14.2 % (ref 11.5–15.5)
WBC: 5.6 10*3/uL (ref 4.0–10.5)

## 2018-02-18 LAB — BASIC METABOLIC PANEL
Anion gap: 12 (ref 5–15)
BUN: 18 mg/dL (ref 6–20)
CO2: 21 mmol/L — ABNORMAL LOW (ref 22–32)
Calcium: 9.1 mg/dL (ref 8.9–10.3)
Chloride: 105 mmol/L (ref 101–111)
Creatinine, Ser: 1.35 mg/dL — ABNORMAL HIGH (ref 0.44–1.00)
GFR calc Af Amer: 47 mL/min — ABNORMAL LOW (ref >60)
GFR, EST NON AFRICAN AMERICAN: 40 mL/min — AB (ref >60)
GLUCOSE: 153 mg/dL — AB (ref 65–99)
POTASSIUM: 3.9 mmol/L (ref 3.5–5.1)
Sodium: 138 mmol/L (ref 135–145)

## 2018-02-18 LAB — MAGNESIUM: Magnesium: 2.1 mg/dL (ref 1.7–2.4)

## 2018-02-18 LAB — VALPROIC ACID LEVEL: Valproic Acid Lvl: 59 ug/mL (ref 50.0–100.0)

## 2018-02-18 MED ORDER — LEVETIRACETAM IN NACL 1000 MG/100ML IV SOLN
1000.0000 mg | Freq: Two times a day (BID) | INTRAVENOUS | Status: DC
  Administered 2018-02-18 – 2018-02-22 (×8): 1000 mg via INTRAVENOUS
  Filled 2018-02-18 (×11): qty 100

## 2018-02-18 MED ORDER — GEMFIBROZIL 600 MG PO TABS
600.0000 mg | ORAL_TABLET | Freq: Two times a day (BID) | ORAL | Status: DC
  Administered 2018-02-18 – 2018-02-22 (×10): 600 mg via ORAL
  Filled 2018-02-18 (×10): qty 1

## 2018-02-18 MED ORDER — INSULIN ASPART PROT & ASPART (70-30 MIX) 100 UNIT/ML ~~LOC~~ SUSP
26.0000 [IU] | Freq: Two times a day (BID) | SUBCUTANEOUS | Status: DC
  Administered 2018-02-18 – 2018-02-20 (×4): 26 [IU] via SUBCUTANEOUS

## 2018-02-18 NOTE — Progress Notes (Signed)
Pt stated not able to lift arm, not able to squeeze nor push at 2120. Able to regain function after a minute.Fosphenytoin infusing and pt developed itching. Infusion stop.  Neurologist notified.  IV benadryl administered.

## 2018-02-18 NOTE — Evaluation (Signed)
Occupational Therapy Evaluation Patient Details Name: [name] MRN: [id number] DOB: [birth year] Today's Date: 02/18/2018    History of Present Illness 67 y.o. female admitted for HA, left sided weakness and slurred speech. Brain MR revealed acute to subacute right corona radiata infarct with a few adjacent punctate foci. PMH includes but not limited to hx of R genu CVA, DM, HTN, PVD, HLD, and GERD.   Clinical Impression   Pt with decline in function and safety with ADLs and ADL mobility with decreased strength, balance, coordination, cognition, endurance and safety. Pt impulsive and required verbal and tactile cues for correct hand placement during ADL mobility and transfers. Pt would benefit from acute OT services to address impairments to maximize level of function and safety    Follow Up Recommendations  CIR    Equipment Recommendations  Other (comment)(TBD at next venue of care)    Recommendations for Other Services       Precautions / Restrictions Precautions Precautions: Fall Restrictions Weight Bearing Restrictions: No      Mobility Bed Mobility Overal bed mobility: Needs Assistance Bed Mobility: Sit to Supine     Supine to sit: Min guard     General bed mobility comments: pt up ambulating with PT upon arrival. Assisted pt back to bed after OT  Transfers Overall transfer level: Needs assistance Equipment used: None Transfers: Sit to/from Stand Sit to Stand: Min assist;Min guard         General transfer comment: min assist - min guard for safety and balance. pt unsteady and not placing hands correctly during sit - stand/stand - sit transitions. Required use of grab bar for safe shower transfer    Balance Overall balance assessment: Needs assistance Sitting-balance support: Feet supported;No upper extremity supported Sitting balance-Leahy Scale: Fair     Standing balance support: No upper extremity supported;During functional activity;Single extremity  supported Standing balance-Leahy Scale: Fair Standing balance comment: able to walk without AD but very unsteady and requires external support to prevent falls                           ADL either performed or assessed with clinical judgement   ADL Overall ADL's : Needs assistance/impaired Eating/Feeding: Set up;Sitting;Supervision/ safety   Grooming: Wash/dry hands;Wash/dry face;Standing;Min guard   Upper Body Bathing: Set up;Supervision/ safety;Sitting   Lower Body Bathing: Minimal assistance;Cueing for safety;Sit to/from stand   Upper Body Dressing : Set up;Supervision/safety;Sitting   Lower Body Dressing: Minimal assistance;Cueing for safety;Sit to/from stand   Toilet Transfer: Minimal assistance;Min guard;RW;Ambulation;Grab bars;Comfort height toilet   Toileting- Clothing Manipulation and Hygiene: Min guard;Sit to/from stand   Tub/ Shower Transfer: Minimal assistance;Ambulation;Grab bars;3 in 1   Functional mobility during ADLs: Minimal assistance;Min guard;Rolling walker;Cueing for safety;Cueing for sequencing General ADL Comments: pt impulsive and required verbal and tactile cues for correct hand placement during ADL mobility and transfers     Vision Patient Visual Report: No change from baseline(per pt)       Perception     Praxis      Pertinent Vitals/Pain Pain Assessment: No/denies pain Pain Intervention(s): Monitored during session     Hand Dominance Right   Extremity/Trunk Assessment     Lower Extremity Assessment Lower Extremity Assessment: Defer to PT evaluation   Cervical / Trunk Assessment Cervical / Trunk Assessment: Normal   Communication Communication Communication: No difficulties   Cognition Arousal/Alertness: Awake/alert Behavior During Therapy: Impulsive Overall Cognitive Status: Impaired/Different from baseline Area  of Impairment: Attention;Memory;Following commands;Safety/judgement;Awareness                      Memory: Decreased recall of precautions;Decreased short-term memory Following Commands: Follows one step commands inconsistently Safety/Judgement: Decreased awareness of safety;Decreased awareness of deficits         General Comments       Exercises     Shoulder Instructions      Home Living Family/patient expects to be discharged to:: Private residence Living Arrangements: Alone Available Help at Discharge: Friend(s);Available PRN/intermittently   Home Access: Stairs to enter Entrance Stairs-Number of Steps: 5 Entrance Stairs-Rails: Can reach both;Right;Left Home Layout: One level     Bathroom Shower/Tub: Teacher, early years/pre: Standard     Home Equipment: None      Lives With: Alone    Prior Functioning/Environment Level of Independence: Independent        Comments: history of 4 falls (pt reports thursday was most recent fall)        OT Problem List: Decreased strength;Decreased activity tolerance;Impaired balance (sitting and/or standing);Decreased coordination;Decreased safety awareness;Decreased cognition;Decreased knowledge of use of DME or AE;Decreased knowledge of precautions      OT Treatment/Interventions: Self-care/ADL training;DME and/or AE instruction    OT Goals(Current goals can be found in the care plan section) Acute Rehab OT Goals Patient Stated Goal: to get better OT Goal Formulation: With patient/family Time For Goal Achievement: 03/04/18 Potential to Achieve Goals: Good ADL Goals Pt Will Perform Grooming: with supervision;with set-up;standing Pt Will Perform Upper Body Bathing: with set-up Pt Will Perform Lower Body Bathing: with min guard assist;sit to/from stand Pt Will Perform Upper Body Dressing: with set-up;standing Pt Will Perform Lower Body Dressing: with min guard assist;sit to/from stand Pt Will Transfer to Toilet: with min guard assist;with supervision;ambulating;grab bars Pt Will Perform Toileting - Clothing  Manipulation and hygiene: with supervision;sit to/from stand Pt Will Perform Tub/Shower Transfer: with min guard assist;ambulating;rolling walker;grab bars;3 in 1;shower seat Additional ADL Goal #1: Pt will tolerare L UE coordination and stengthening exercises for ADLs and functional tasks  OT Frequency: Min 2X/week   Barriers to D/C: Decreased caregiver support          Co-evaluation              AM-PAC PT "6 Clicks" Daily Activity     Outcome Measure Help from another person eating meals?: None Help from another person taking care of personal grooming?: A Little Help from another person toileting, which includes using toliet, bedpan, or urinal?: A Little Help from another person bathing (including washing, rinsing, drying)?: A Lot Help from another person to put on and taking off regular upper body clothing?: A Little Help from another person to put on and taking off regular lower body clothing?: A Lot 6 Click Score: 17   End of Session Equipment Utilized During Treatment: Gait belt;Rolling walker;Other (comment)(3 in 1)  Activity Tolerance: Patient tolerated treatment well Patient left: in bed;with call bell/phone within reach;with bed alarm set;with family/visitor present  OT Visit Diagnosis: Unsteadiness on feet (R26.81);Other abnormalities of gait and mobility (R26.89);Muscle weakness (generalized) (M62.81);History of falling (Z91.81);Other symptoms and signs involving the nervous system (R29.898)                Time: 3235-5732 OT Time Calculation (min): 24 min Charges:  OT General Charges $OT Visit: 1 Visit OT Evaluation $OT Eval Moderate Complexity: 1 Mod G-Codes: OT G-codes **NOT FOR INPATIENT CLASS** Functional Assessment Tool  Used: AM-PAC 6 Clicks Daily Activity     Britt Bottom 02/18/2018, 1:39 PM

## 2018-02-18 NOTE — Progress Notes (Signed)
Internal Medicine Attending:   I saw and examined the patient. I reviewed the resident's note and I agree with the resident's findings and plan as documented in the resident's note.  Patient feels well today with no new complaints.  Patient was initially admitted for worsening left-sided weakness and seizure-like activity and was found to have an acute/early subacute infarct in the right mid corona radiata.  Neurology follow-up and recommendations appreciated.  2D echo results noted.  Carotid Doppler results noted.  Continue with Plavix 75 mg daily.  Patient is intolerant to statins so we will continue Lopid for now.  Patient with intermittent episodes of simple partial seizures while in the hospital.  Neuro increase Keppra to 1000 mg twice daily and will continue with Depakote 500 mg twice daily for now.  EEG was within normal limits.  Patient will now be allowed to drive until seizure-free for 6 months.  PT/OT follow-up and recommendations appreciated.  Patient will likely be discharged to CIR.  No further workup at this time.  Patient is stable for discharge to CIR once bed is available.

## 2018-02-18 NOTE — Progress Notes (Signed)
Physical Therapy Treatment Patient Details Name: [name] MRN: [id number] DOB: [birth year] Today's Date: 02/18/2018    History of Present Illness 67 y.o. female admitted for HA, left sided weakness and slurred speech. Brain MR revealed acute to subacute right corona radiata infarct with a few adjacent punctate foci. PMH includes but not limited to hx of R genu CVA, DM, HTN, PVD, HLD, and GERD.    PT Comments    Pt con't to demo L sided weakness, impaired balance, impaired processing and decreased insight to deficits and safety awareness. Pt requires assist for all mobility due to high falls risk. Con't to recommend CIR upon d/c for maximal functional return as pt was independent PTA.   Follow Up Recommendations  CIR     Equipment Recommendations  Other (comment)(defer to next venue of care)    Recommendations for Other Services Rehab consult     Precautions / Restrictions Precautions Precautions: Fall Restrictions Weight Bearing Restrictions: No    Mobility  Bed Mobility Overal bed mobility: Needs Assistance Bed Mobility: Supine to Sit     Supine to sit: Min guard     General bed mobility comments: pt with HOB elevated, used R hand rail to pull self up  Transfers Overall transfer level: Needs assistance Equipment used: None Transfers: Sit to/from Stand Sit to Stand: Min assist;Min guard         General transfer comment: pt unsteady with alteral sway, minA for safety  Ambulation/Gait Ambulation/Gait assistance: Min assist Ambulation Distance (Feet): 150 Feet Assistive device: None;Rolling walker (2 wheeled) Gait Pattern/deviations: Step-through pattern;Decreased stride length;Drifts right/left Gait velocity: decreased   General Gait Details: pt amb 75 feet without AD, pt required minA to maintain balance, pt with occasional cross over gait and very unsteady. pt then given RW. pt more steady however con't to be unsafe as pt requires minA for walker  managment. pt with strong pushing of walker to the L, due to stronger R UE, L UE weakness. Pt requires assist for all mobility due to high falls risk with and without AD   Stairs            Wheelchair Mobility    Modified Rankin (Stroke Patients Only) Modified Rankin (Stroke Patients Only) Pre-Morbid Rankin Score: No symptoms Modified Rankin: Moderately severe disability     Balance Overall balance assessment: Needs assistance Sitting-balance support: Feet supported;No upper extremity supported Sitting balance-Leahy Scale: Fair     Standing balance support: No upper extremity supported;During functional activity;Single extremity supported Standing balance-Leahy Scale: Fair Standing balance comment: pt able to wash hands at sink but did lean on sink                            Cognition Arousal/Alertness: Awake/alert Behavior During Therapy: Impulsive Overall Cognitive Status: Impaired/Different from baseline Area of Impairment: Attention;Memory;Following commands;Safety/judgement;Awareness                   Current Attention Level: Selective Memory: Decreased recall of precautions;Decreased short-term memory Following Commands: Follows one step commands with increased time;Follows multi-step commands inconsistently Safety/Judgement: Decreased awareness of safety;Decreased awareness of deficits Awareness: Emergent   General Comments: pt able to state she had to use bathroom, pt slightly impulsive but most likely due to urgency. pt with difficulty following multistep commands trying to navigate hallways      Exercises      General Comments General comments (skin integrity, edema, etc.): pt did perform  pericare without difficulty in sitting position      Pertinent Vitals/Pain Pain Assessment: No/denies pain Pain Intervention(s): Monitored during session    [place] expects to be discharged to:: Private residence Living  Arrangements: Alone Available Help at Discharge: Friend(s);Available PRN/intermittently   Home Access: Stairs to enter Entrance Stairs-Rails: Can reach both;Right;Left Home Layout: One level Home Equipment: None      Prior Function Level of Independence: Independent      Comments: history of 4 falls (pt reports thursday was most recent fall)   PT Goals (current goals can now be found in the care plan section) Acute Rehab PT Goals Patient Stated Goal: to get better Progress towards PT goals: Progressing toward goals    Frequency    Min 4X/week      PT Plan Current plan remains appropriate    Co-evaluation              AM-PAC PT "6 Clicks" Daily Activity  Outcome Measure  Difficulty turning over in bed (including adjusting bedclothes, sheets and blankets)?: Unable Difficulty moving from lying on back to sitting on the side of the bed? : Unable Difficulty sitting down on and standing up from a chair with arms (e.g., wheelchair, bedside commode, etc,.)?: A Lot Help needed moving to and from a bed to chair (including a wheelchair)?: A Little Help needed walking in hospital room?: A Little Help needed climbing 3-5 steps with a railing? : A Lot 6 Click Score: 12    End of Session Equipment Utilized During Treatment: Gait belt Activity Tolerance: Patient tolerated treatment well Patient left: (at EOB with OT present) Nurse Communication: Mobility status PT Visit Diagnosis: Unsteadiness on feet (R26.81);Other abnormalities of gait and mobility (R26.89);Muscle weakness (generalized) (M62.81);History of falling (Z91.81);Difficulty in walking, not elsewhere classified (R26.2);Other symptoms and signs involving the nervous system (R29.898)     Time: 1244-1300 PT Time Calculation (min) (ACUTE ONLY): 16 min  Charges:  $Gait Training: 8-22 mins                    G Codes:       Kittie Plater, PT, DPT Pager #: [phone] Office #: [phone]    [place] 02/18/2018, 1:55 PM

## 2018-02-18 NOTE — Progress Notes (Signed)
Inpatient Rehabilitation  Met with patient at bedside to discuss team's recommendation for IP Rehab.  Shared booklets and answered initial questions.  Patient also requested that I discuss rehab with her daughter, [name].  Plan to initiate insurance authorization.  Will follow for timing of medical readiness, insurance authorization, and IP Rehab bed availability.  Call if questions.    Carmelia Roller., CCC/SLP Admission Coordinator  [place]  Cell [phone]

## 2018-02-18 NOTE — Care Management Note (Signed)
Case Management Note  Patient Details  Name: [name] MRN: [id number] Date of Birth: 09-04-1951  Subjective/Objective:    Pt admitted with CVA. She is from home alone.                 Action/Plan: CM spoke to patient and her daughter over the phone. Daughter, [name] is very interested in her mother going to CIR. Per notes awaiting [employer] Authorization. CM following.    Expected Discharge Date:  02/19/18               Expected Discharge Plan:  [place]  In-House Referral:     Discharge planning Services  CM Consult  Post Acute Care Choice:    Choice offered to:     DME Arranged:    DME Agency:     HH Arranged:    HH Agency:     Status of Service:  In process, will continue to follow  If discussed at Long Length of Stay Meetings, dates discussed:    Additional Comments:  Pollie Friar, RN 02/18/2018, 3:39 PM

## 2018-02-18 NOTE — Progress Notes (Addendum)
STROKE TEAM PROGRESS NOTE   SUBJECTIVE (INTERVAL HISTORY) Pt sister and boyfriend at bedside, I also talked with pt daughter over the phone. Pt and daughter and sister recounted HPI with me. She was not on her medications for 3 weeks PTA as pharmacy would not give her the same medications she had for the last 5 years. One week ago, she started to have intermittent left hand weakness and dropping things. She also had two episodes of feeling weird during driving and had to pull over. Last Friday, she had left gaze, left head turning, and left arm draw up with left hand clenching for 30-40 sec. During that time, she said she did not LOC but not able to respond. She had left gaze and left head turning yesterday morning but no left arm symptoms. She had unwitnessed seizure like activity of left hand shaking episode last evening. She was put on keppra on admission, but added depakote last night after loading for seizure control. She was initially given dilantin but developed itching, so dilantin was discontinued. Today her depakote level in am was 59. Her Cre improved to 1.35, will increase keppra to 1000mg  bid.   Right after round, I was outside the room, RN told me that she felt an episode of left arm muscle pulling, with 3-4 sec of shaking. I went back to the room, she said she still feeling the left arm muscle pulling sensation at ulnar side of arm, but no shaking as I can see. I suspect that could be muscle spasm due to her right CR infarct.    OBJECTIVE Temp:  [98 F (36.7 C)-98.9 F (37.2 C)] 98.4 F (36.9 C) (04/08 0752) Pulse Rate:  [80-99] 88 (04/08 0752) Cardiac Rhythm: Normal sinus rhythm (04/07 1906) Resp:  [18-24] 19 (04/08 0752) BP: (134-165)/(57-81) 165/63 (04/08 0752) SpO2:  [94 %-99 %] 96 % (04/08 0752)  CBC:  Recent Labs  Lab 02/15/18 2328  02/17/18 0844 02/18/18 0245  WBC 7.0   < > 5.6 5.6  NEUTROABS 4.4  --   --   --   HGB 10.9*   < > 10.2* 9.6*  HCT 33.8*   < > 31.7* 28.9*   MCV 89.2   < > 89.0 87.3  PLT 275   < > 278 245   < > = values in this interval not displayed.    Basic Metabolic Panel:  Recent Labs  Lab 02/17/18 0844 02/18/18 0245  NA 136 138  K 3.9 3.9  CL 100* 105  CO2 22 21*  GLUCOSE 308* 153*  BUN 23* 18  CREATININE 1.71* 1.35*  CALCIUM 8.9 9.1  MG  --  2.1    Lipid Panel:     Component Value Date/Time   CHOL 168 02/16/2018 0200   CHOL 200 (H) 10/18/2016 1518   TRIG 255 (H) 02/16/2018 0200   HDL 30 (L) 02/16/2018 0200   HDL 34 (L) 10/18/2016 1518   CHOLHDL 5.6 02/16/2018 0200   VLDL 51 (H) 02/16/2018 0200   LDLCALC 87 02/16/2018 0200   LDLCALC 99 10/18/2016 1518   HgbA1c:  Lab Results  Component Value Date   HGBA1C 8.9 (H) 02/16/2018   Urine Drug Screen:     Component Value Date/Time   LABOPIA NONE DETECTED 02/16/2018 1201   COCAINSCRNUR NONE DETECTED 02/16/2018 1201   LABBENZ NONE DETECTED 02/16/2018 1201   AMPHETMU NONE DETECTED 02/16/2018 1201   THCU NONE DETECTED 02/16/2018 1201   LABBARB NONE DETECTED 02/16/2018 1201  Alcohol Level     Component Value Date/Time   ETH <10 02/15/2018 2328    IMAGING I have personally reviewed the radiological images below and agree with the radiology interpretations.  Ct Head Wo Contrast 02/16/2018 IMPRESSION:  Suggestion of acute infarct in the right periventricular and insular region. Consider MRI correlation if clinically indicated. No acute intracranial hemorrhage or mass effect.   Mr Brain Wo Contrast 02/16/2018 MRI head:  1. Subcentimeter acute/early subacute infarction and right mid corona radiata with few adjacent punctate foci. No associated hemorrhage or mass effect.  2. Mild chronic microvascular ischemic changes and mild parenchymal volume loss of the brain.  3. Small chronic infarcts in right genu of corpus callosum, right body of corpus callosum, and right hemi pons.  MRA head:  Severe motion artifact. Suboptimal assessment for stenosis or small aneurysm.  No large vessel occlusion or large aneurysm identified.   Transthoracic Echocardiogram - Left ventricle: The cavity size was normal. Wall thickness was   normal. Systolic function was normal. The estimated ejection   fraction was in the range of 60% to 65%. Wall motion was normal;   there were no regional wall motion abnormalities. Doppler   parameters are consistent with abnormal left ventricular   relaxation (grade 1 diastolic dysfunction).  Bilateral Carotid Dopplers  02/16/2018 1-39% ICA plaquing. Vertebral artery flow is antegrade.   EEG This normal EEG is recorded in the waking and sleep state. There was no seizure or seizure predisposition recorded on this study. Please note that a normal EEG does not preclude the possibility of epilepsy.    PHYSICAL EXAM Temp:  [98 F (36.7 C)-98.9 F (37.2 C)] 98.4 F (36.9 C) (04/08 0752) Pulse Rate:  [80-99] 88 (04/08 0752) Resp:  [18-24] 19 (04/08 0752) BP: (134-165)/(57-81) 165/63 (04/08 0752) SpO2:  [94 %-99 %] 96 % (04/08 0752)  General - Well nourished, well developed, in no apparent distress.  Ophthalmologic - Fundi not visualized due to small pupils.  Cardiovascular - Regular rate and rhythm.  Mental Status -  Level of arousal and orientation to time, place, and person were intact. Language including expression, naming, repetition, comprehension was assessed and found intact. Fund of Knowledge was assessed and was intact.  Cranial Nerves II - XII - II - Visual field intact OU. III, IV, VI - Extraocular movements intact. V - Facial sensation intact bilaterally. VII - left nasolabial fold flattening. VIII - Hearing & vestibular intact bilaterally. X - Palate elevates symmetrically. XI - Chin turning & shoulder shrug intact bilaterally. XII - Tongue protrusion intact.  Motor Strength - The patient's strength was symmetrical in all extremities and pronator drift was absent.  Bulk was normal and fasciculations were absent.    Motor Tone - Muscle tone was assessed at the neck and appendages and was normal.  Reflexes - The patient's reflexes were 1+ in all extremities and she had no pathological reflexes.  Sensory - Light touch, temperature/pinprick were assessed and were symmetrical.    Coordination - The patient had dysmetria on the left arm with FTN testing.  Tremor was absent.  Gait and Station - deferred.   ASSESSMENT/PLAN Ms. DANIELLA DEWBERRY is a 67 y.o. female with history of peripheral vascular disease, stroke x2 affecting the left side with no residual deficits, hypertension, diabetes, hyperlipidemia presenting with left-sided weakness, simple partial seizures, and a new headache. She did not receive IV t-PA due to late presentation.  Stroke:  subacute right mid corona radiata infarct, likely  due to small vessel disease.  Resultant   Mild left facial droop and left arm incoordination   CT head - Suggestion of acute infarct in the right periventricular and insular region.  MRI head - Subcentimeter acute/early subacute infarction at right mid corona radiata with few adjacent punctate foci with multiple old infarcts.  MRA head - motion degraded but otherwise unremarkable  UDS - negative  Carotid Doppler - 1-39% ICA plaquing. Vertebral artery flow is antegrade.   2D Echo - EF 60-65%  LDL - 87  HgbA1c - 8.9  VTE prophylaxis - [place] heparin Fall precautions Seizure precautions Diet heart healthy/carb modified Room service appropriate? Yes; Fluid consistency: Thin  clopidogrel 75 mg daily and Pletal prior to admission, now on clopidogrel 75 mg daily and Pletal  Patient will be counseled to be compliant with her antithrombotic medications  Ongoing aggressive stroke risk factor management  Therapy recommendations:  pending  Disposition:  Pending  Simple partial seizure  Episodes of left eye gaze, left head turning, left arm tonic flexion   No LOC, able to recall episodes  On keppra,  increase to 1000mg  bid  On depakote 500mg  bid  depakote level 59   EEG normal  Seizure could be due to current/prior stroke in the setting of UTI  No driving until seizure free for 6 months.   Hx of stroke  First one in 2004 and on plavix  01/2011 right CC stroke with left sided weakness, balance issue and slurry speech - residue of walking with hopping on the left side - on plavix and platel  Hypertension  Stable  Permissive hypertension (OK if < 220/120) but gradually normalize in 5-7 days  Long-term BP goal normotensive  Hyperlipidemia  Lipid lowering medication PTA: lopid,  Intolerant to statins  LDL 87, goal < 70  Current lipid lowering medication: lopid  Continue at discharge  Diabetes  HgbA1c 8.9, goal < 7.0  Uncontrolled  Off medication PTA  hyperglycemia  SSI  CBG monitoring  UTI  UA WBC 6-30  On rocephin  AKI  Cre 1.50->1.46->1.71->1.35  Encourage po intake  Close monitoring  Increase keppra dose as Cre improved.  Other Stroke Risk Factors  Advanced age  Former cigarette smoker - quit 7 years ago.  Obesity, Body mass index is 30.65 kg/m., recommend weight loss, diet and exercise as appropriate   PVD - on plavix and platel  Other Active Problems  Grave's disease s/p iodine ablation - on supplement - TSH and FT4 pending  I spent  35 minutes in total face-to-face time with the patient, more than 50% of which was spent in counseling and coordination of care, reviewing test results, images and medication, and discussing the diagnosis of stroke, new onset seizure, continued seizure activities, its treatment plan and potential prognosis. This patient's care requiresreview of multiple databases, neurological assessment, discussion with family, other specialists and medical decision making of high complexity. I had long discussion with pt, sister at bedside and daughter over the phone, updated pt current condition, treatment plan and  potential prognosis. They expressed understanding and appreciation.   [name], MD PhD Stroke Neurology 02/18/2018 12:30 PM  To contact Stroke Continuity provider, please refer to [email or web address] After hours, contact General Neurology

## 2018-02-18 NOTE — Consult Note (Signed)
Physical Medicine and Rehabilitation Consult Reason for Consult: Left side weakness and slurred speech Referring Physician: [facility]   HPI: [name] is a 67 y.o. right-handed female with history of hypertension, remote tobacco abuse, diabetes mellitus, CKD stage III as well as prior CVA x2 maintained on Plavix with little residual weakness.  Per chart review patient lives alone and independent still driving.  One level apartment with 3 steps to entry.  She has a daughter in the area that works. Presented 02/16/2018 with left-sided weakness, headache question seizure and noted falls.  Patient initially came to the emergency room 02/10/2018 for headache and reported left-sided weakness however she refused to stay for any further medical workup due to long wait times and left without being seen by MD.  Cranial CT 02/16/2018 scan showed suggestion of acute infarction in the right periventricular and insular region.  No acute intracranial hemorrhage.  UDS negative.  Patient did not receive TPA.    EEG negative for seizure.  MRI showed subcentimeter acute early subacute infarction in right mid corona radiata with a few adjacent punctate foci.  Small chronic infarcts in the right genu of corpus callosum, right body of corpus callosum and right hemi-pons.  Echocardiogram with ejection fraction of 26% grade 1 diastolic dysfunction.  Neurology follow-up currently maintained on Depakote/Keppra for question seizure and workup currently ongoing.  Currently remains on Plavix for CVA prophylaxis.  Subcutaneous heparin for DVT prophylaxis.  Tolerating a regular diet.  Physical therapy evaluation completed with recommendations of physical medicine rehab consult.  Review of Systems  Constitutional: Negative for chills and fever.  HENT: Negative for hearing loss.   Eyes: Negative for blurred vision and double vision.  Respiratory: Negative for cough and shortness of breath.   Cardiovascular: Negative for chest  pain, palpitations and leg swelling.  Gastrointestinal: Positive for constipation. Negative for nausea and vomiting.       GERD  Genitourinary: Negative for dysuria, flank pain and hematuria.  Musculoskeletal: Positive for myalgias.  Neurological: Positive for speech change, focal weakness and weakness.  All other systems reviewed and are negative.  Past Medical History:  Diagnosis Date  . Anemia   . Arteriosclerotic cardiovascular disease   . Claudication in peripheral vascular disease ([place])   . CVA (cerebral vascular accident) ([place]) 01/2011   affected left side  . Diabetes mellitus 2000  . GERD (gastroesophageal reflux disease)   . Hyperlipidemia   . Hypertension   . Hypothyroidism   . Renal insufficiency   . Vitamin D deficiency    Past Surgical History:  Procedure Laterality Date  . CESAREAN SECTION    . LEFT HEART CATHETERIZATION WITH CORONARY ANGIOGRAM N/A 03/08/2012   Procedure: LEFT HEART CATHETERIZATION WITH CORONARY ANGIOGRAM;  Surgeon: Sinclair Grooms, MD;  Location: [facility] CATH LAB;  Service: Cardiovascular;  Laterality: N/A;  . LEFT HEART CATHETERIZATION WITH CORONARY ANGIOGRAM N/A 12/17/2012   Procedure: LEFT HEART CATHETERIZATION WITH CORONARY ANGIOGRAM;  Surgeon: Laverda Page, MD;  Location: [facility] CATH LAB;  Service: Cardiovascular;  Laterality: N/A;  . LOWER EXTREMITY ANGIOGRAM N/A 12/17/2012   Procedure: LOWER EXTREMITY ANGIOGRAM;  Surgeon: Laverda Page, MD;  Location: [facility] CATH LAB;  Service: Cardiovascular;  Laterality: N/A;  . TUBAL LIGATION     Family History  Problem Relation Age of Onset  . Diabetes Brother   . Prostate cancer Brother   . Diabetes Mother   . Diabetes Father   . Diabetes Sister   .  Esophageal cancer Neg Hx   . Rectal cancer Neg Hx   . Stomach cancer Neg Hx   . Colon cancer Neg Hx   . Breast cancer Neg Hx    Social History:  reports that she quit smoking about 7 years ago. She has never used smokeless tobacco. She reports that she  does not drink alcohol or use drugs. Allergies:  Allergies  Allergen Reactions  . Insulins Rash    Humalog 75/25 only  . Statins     "sick to my stomach"   Medications Prior to Admission  Medication Sig Dispense Refill  . cilostazol (PLETAL) 100 MG tablet Take 100 mg by mouth 2 (two) times daily.    . clopidogrel (PLAVIX) 75 MG tablet TAKE 1 TABLET BY MOUTH EVERY MORNING 90 tablet 3  . gemfibrozil (LOPID) 600 MG tablet TAKE 1 TABLET BY MOUTH TWICE A DAY 60 tablet 5  . hydrochlorothiazide (HYDRODIURIL) 25 MG tablet Take 1 tablet (25 mg total) by mouth daily. 90 tablet 3  . Insulin Isophane & Regular Human (NOVOLIN 70/30 FLEXPEN) (70-30) 100 UNIT/ML PEN Inject 36 Units into the skin daily. 15 mL 8  . levothyroxine (SYNTHROID, LEVOTHROID) 88 MCG tablet TAKE 1 TABLET BY MOUTH EVERY DAY BEFORE BREAKFAST 90 tablet 3  . liraglutide 18 MG/3ML SOPN Inject 0.2 mLs (1.2 mg total) into the skin daily. 6 mL 3  . losartan (COZAAR) 100 MG tablet TAKE 1 TABLET BY MOUTH EVERY DAY 90 tablet 3  . Multiple Vitamins-Minerals (MULTIVITAMIN ADULT PO) Take 1 tablet by mouth once a week. On Wednesday    . BD INSULIN SYRINGE ULTRAFINE 31G X 15/64" 0.5 ML MISC USE TO INJECT INSULIN TWO TIMES A DAY 100 each 11  . glucose blood (ACCU-CHEK AVIVA PLUS) test strip Use to check blood sugars twice a day.Dx Code: E11.22. 100 each 4  . Insulin Pen Needle (B-D UF III MINI PEN NEEDLES) 31G X 5 MM MISC The patient is insulin requiring, ICD 10 code E11.9. The patient injects insulin 2 times per day. 100 each 2  . ranitidine (ZANTAC) 150 MG tablet TAKE 1 TABLET BY MOUTH EVERY DAY AS NEEDED (Patient not taking: Reported on 02/16/2018) 30 tablet 4  . sulfamethoxazole-trimethoprim (BACTRIM DS,SEPTRA DS) 800-160 MG tablet Take 1 tablet by mouth 2 (two) times daily for 3 days. 6 tablet 0    Home: Home Living Family/patient expects to be discharged to:: Private residence Living Arrangements: Alone Available Help at Discharge:  Friend(s), Available PRN/intermittently Type of Home: Apartment Home Access: Stairs to enter [employer] of Steps: 5(3 steps in front with no rails) Entrance Stairs-Rails: Can reach both, Right, Left Home Layout: One level Bathroom Shower/Tub: Chiropodist: Standard Home Equipment: None  Lives With: Alone  Functional History: Prior Function Level of Independence: Independent Comments: history of 4 falls (pt reports thursday was most recent fall) Functional Status:  Mobility: Bed Mobility Overal bed mobility: Needs Assistance Bed Mobility: Supine to Sit Supine to sit: Min guard General bed mobility comments: min guard for safety with use of bed rail Transfers Overall transfer level: Needs assistance Equipment used: None Transfers: Sit to/from Stand Sit to Stand: Min assist General transfer comment: min assist for safety and balance. pt unsteady and reaching for IV pole to steady herself Ambulation/Gait Ambulation/Gait assistance: Min assist Ambulation Distance (Feet): 80 Feet Assistive device: None Gait Pattern/deviations: Step-through pattern, Decreased stride length, Drifts right/left General Gait Details: pt with unsteady gait requring close min guard to  min assist for balance. 2 episodes of LOB noted requiring min assist to prevent fall. righting reactions present but delayed. notes pt is especially unsteady with multitasking (talking) and turning Gait velocity: decreased Gait velocity interpretation: Below normal speed for age/gender    ADL:    Cognition: Cognition Overall Cognitive Status: Impaired/Different from baseline Arousal/Alertness: Awake/alert Orientation Level: Oriented X4 Attention: Selective Selective Attention: Impaired Selective Attention Impairment: Verbal basic, Functional basic Memory: Impaired Memory Impairment: Other (comment)(working memory) Awareness: Impaired Awareness Impairment: Intellectual impairment,  Emergent impairment, Anticipatory impairment Problem Solving: Impaired Problem Solving Impairment: Functional basic, Verbal basic Behaviors: Impulsive Safety/Judgment: Impaired Cognition Arousal/Alertness: Awake/alert Behavior During Therapy: Impulsive Overall Cognitive Status: Impaired/Different from baseline General Comments: pt had to be told multiple times not to get out of bed before PT was ready. may have mildly slow processing with slight delay in command following.  Blood pressure 134/62, pulse 88, temperature 98.9 F (37.2 C), temperature source Oral, resp. rate 18, height 5\' 2"  (1.575 m), weight 76 kg (167 lb 8.8 oz), SpO2 94 %. Physical Exam  Vitals reviewed. Constitutional: She is oriented to person, place, and time.  Eyes: EOM are normal. Right eye exhibits no discharge. Left eye exhibits no discharge.  Neck: Normal range of motion. Neck supple. No thyromegaly present.  Cardiovascular: Normal rate, regular rhythm and normal heart sounds.  Respiratory: Effort normal and breath sounds normal. No respiratory distress.  GI: Soft. Bowel sounds are normal. She exhibits no distension.  Neurological: She is alert and oriented to person, place, and time.  Fair awareness of deficits. Followed commands. Normal language. Functional swallow. LUE 4/5 prox to distal. LLE: 3+HF, 4- KE and 4/5 ADF/PF. Decreased FTN, +left PD. Sensory function appears intact.   Skin: Skin is warm and dry.  Psychiatric: She has a normal mood and affect. Her behavior is normal.    Results for orders placed or performed during the hospital encounter of 02/15/18 (from the past 24 hour(s))  Glucose, capillary     Status: Abnormal   Collection Time: 02/17/18  6:25 AM  Result Value Ref Range   Glucose-Capillary 360 (H) 65 - 99 mg/dL   Comment 1 Notify RN   Basic metabolic panel     Status: Abnormal   Collection Time: 02/17/18  8:44 AM  Result Value Ref Range   Sodium 136 135 - 145 mmol/L   Potassium 3.9 3.5  - 5.1 mmol/L   Chloride 100 (L) 101 - 111 mmol/L   CO2 22 22 - 32 mmol/L   Glucose, Bld 308 (H) 65 - 99 mg/dL   BUN 23 (H) 6 - 20 mg/dL   Creatinine, Ser 1.71 (H) 0.44 - 1.00 mg/dL   Calcium 8.9 8.9 - 10.3 mg/dL   GFR calc non Af Amer 30 (L) >60 mL/min   GFR calc Af Amer 35 (L) >60 mL/min   Anion gap 14 5 - 15  CBC     Status: Abnormal   Collection Time: 02/17/18  8:44 AM  Result Value Ref Range   WBC 5.6 4.0 - 10.5 K/uL   RBC 3.56 (L) 3.87 - 5.11 MIL/uL   Hemoglobin 10.2 (L) 12.0 - 15.0 g/dL   HCT 31.7 (L) 36.0 - 46.0 %   MCV 89.0 78.0 - 100.0 fL   MCH 28.7 26.0 - 34.0 pg   MCHC 32.2 30.0 - 36.0 g/dL   RDW 14.3 11.5 - 15.5 %   Platelets 278 150 - 400 K/uL  Glucose, capillary  Status: Abnormal   Collection Time: 02/17/18 11:43 AM  Result Value Ref Range   Glucose-Capillary 349 (H) 65 - 99 mg/dL   Comment 1 Notify RN    Comment 2 Document in Chart   Glucose, capillary     Status: Abnormal   Collection Time: 02/17/18  4:22 PM  Result Value Ref Range   Glucose-Capillary 318 (H) 65 - 99 mg/dL   Comment 1 Notify RN    Comment 2 Document in Chart   Glucose, capillary     Status: Abnormal   Collection Time: 02/17/18 10:04 PM  Result Value Ref Range   Glucose-Capillary 140 (H) 65 - 99 mg/dL   Comment 1 Notify RN   Basic metabolic panel     Status: Abnormal   Collection Time: 02/18/18  2:45 AM  Result Value Ref Range   Sodium 138 135 - 145 mmol/L   Potassium 3.9 3.5 - 5.1 mmol/L   Chloride 105 101 - 111 mmol/L   CO2 21 (L) 22 - 32 mmol/L   Glucose, Bld 153 (H) 65 - 99 mg/dL   BUN 18 6 - 20 mg/dL   Creatinine, Ser 1.35 (H) 0.44 - 1.00 mg/dL   Calcium 9.1 8.9 - 10.3 mg/dL   GFR calc non Af Amer 40 (L) >60 mL/min   GFR calc Af Amer 47 (L) >60 mL/min   Anion gap 12 5 - 15  Magnesium     Status: None   Collection Time: 02/18/18  2:45 AM  Result Value Ref Range   Magnesium 2.1 1.7 - 2.4 mg/dL  CBC     Status: Abnormal   Collection Time: 02/18/18  2:45 AM  Result Value  Ref Range   WBC 5.6 4.0 - 10.5 K/uL   RBC 3.31 (L) 3.87 - 5.11 MIL/uL   Hemoglobin 9.6 (L) 12.0 - 15.0 g/dL   HCT 28.9 (L) 36.0 - 46.0 %   MCV 87.3 78.0 - 100.0 fL   MCH 29.0 26.0 - 34.0 pg   MCHC 33.2 30.0 - 36.0 g/dL   RDW 14.2 11.5 - 15.5 %   Platelets 245 150 - 400 K/uL  Valproic acid level     Status: None   Collection Time: 02/18/18  2:45 AM  Result Value Ref Range   Valproic Acid Lvl 59 50.0 - 100.0 ug/mL  Glucose, capillary     Status: None   Collection Time: 02/18/18  6:00 AM  Result Value Ref Range   Glucose-Capillary 96 65 - 99 mg/dL   Comment 1 Notify RN    No results found.  Assessment/Plan: Diagnosis: right Corona Radiata infarct with left hemiparesis 1. Does the need for close, 24 hr/day medical supervision in concert with the patient's rehab needs make it unreasonable for this patient to be served in a less intensive setting? Yes 2. Co-Morbidities requiring supervision/potential complications: CKD III, DM3, HTN, PVD 3. Due to bladder management, bowel management, safety, skin/wound care, disease management, medication administration, pain management and patient education, does the patient require 24 hr/day rehab nursing? Yes 4. Does the patient require coordinated care of a physician, rehab nurse, PT (1-2 hrs/day, 5 days/week) and OT (1-2 hrs/day, 5 days/week) to address physical and functional deficits in the context of the above medical diagnosis(es)? Yes Addressing deficits in the following areas: balance, endurance, locomotion, strength, transferring, bowel/bladder control, bathing, dressing, feeding, grooming, toileting and psychosocial support 5. Can the patient actively participate in an intensive therapy program of at least 3 hrs of therapy  per day at least 5 days per week? Yes 6. The potential for patient to make measurable gains while on inpatient rehab is excellent 7. Anticipated functional outcomes upon discharge from inpatient rehab are modified independent   with PT, modified independent with OT, n/a with SLP. 8. Estimated rehab length of stay to reach the above functional goals is: 7-9 days 9. Anticipated D/C setting: Home 10. Anticipated post D/C treatments: HH therapy and Outpatient therapy 11. Overall Rehab/Functional Prognosis: excellent  RECOMMENDATIONS: This patient's condition is appropriate for continued rehabilitative care in the following setting: CIR Patient has agreed to participate in recommended program. Yes Note that insurance prior authorization may be required for reimbursement for recommended care.  Comment: Rehab Admissions Coordinator to follow up.  Thanks,  Meredith Staggers, MD, Mellody Drown    Lavon Paganini Angiulli, PA-C 02/18/2018

## 2018-02-18 NOTE — Patient Outreach (Signed)
[facility]) Care Management  02/18/2018  [name] 1951-10-14 [id number]    RN Health Coach notified that the patient was admitted to the hospital on 02/15/2018 for TIA.    Plan:  RN Health Coach closing the case with the patient due to admission.           RN Health Coach will notify hospital liaison   [name] RN, BSN, [place] Direct Dial:  [phone]  Fax: [phone]

## 2018-02-18 NOTE — Progress Notes (Signed)
Subjective:  Pt alert, headache with resolution, no nausea or vomiting, overall feels better than yesterday, eager to keep walking and getting stronger.  Objective:  Vital signs in last 24 hours: Vitals:   02/17/18 1932 02/18/18 0103 02/18/18 0304 02/18/18 0752  BP: (!) 144/57 (!) 142/81 134/62 (!) 165/63  Pulse: 82 99 88 88  Resp: (!) 24 19 18 19   Temp: 98.6 F (37 C) 98 F (36.7 C) 98.9 F (37.2 C) 98.4 F (36.9 C)  TempSrc: Oral Oral Oral Oral  SpO2:  99% 94% 96%  Weight:      Height:       Physical Exam  Constitutional: She is well-developed, well-nourished, and in no distress.  Cardiovascular: Normal rate and regular rhythm. Exam reveals no gallop and no friction rub.  Murmur (early systolic murmur 2/6) heard. Pulmonary/Chest: Effort normal and breath sounds normal. No respiratory distress. She has no wheezes. She has no rales.  Abdominal: Soft. She exhibits no distension.  Musculoskeletal: She exhibits no edema.  Neurological:  Subtle weakness on left upper and lower extremities but still able to move against gravity and light resistance, full strenght on R upper and lower extremities, facial droop of left face improved from day prior  Skin: Skin is warm and dry.     Assessment/Plan:  Active Problems:   Hypothyroidism   Diabetes mellitus with stage 3 chronic kidney disease ([facility])   Hyperlipidemia   HYPERTENSION, BENIGN SYSTEMIC   PVD (peripheral vascular disease) ([facility])   CKD (chronic kidney disease), stage III ([facility])   UTI (urinary tract infection)   CVA (cerebral vascular accident) ([place])  CVA Patient had an MRI head showing subcentimeter acute/early subacute infarction in the right mid corona radiata with a few adjacent punctate foci. No hemorrhage or mass effect noted.  MRA head with no large vessel occlusion or large aneurysm identified.  Neurology consulted and completing stroke workup.   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 In and out cath ordered.  If patient requires another in and out Cath can place Foley.  -required once so far, voided on her own since then -continue in an out cath as needed  UTI Was being treated outpatient on 4/5 with Bactrim. While inpatient she was started on ceftriaxone.  - UA likely contaminated sample, no urine culture -Ceftriaxone course complete, no symptoms   Hypertension Currently normotensive. Holding home blood pressure medications allowing for permissive hypertension  -holding BP meds BP creeping up will restart antihypertensives tomorrow -Can treat PRN if SBP > 220  T2DM HgbA1C 8.9  -CBG TID with meals, qHS -70/30 26 units BID WC  CKD Stage 3: Cr at baseline of 1.4-1.6.   -Continue to monitor while inpatient.   VTE Prophylaxis: Heparin TID Code Status: Full   Dispo: Anticipated discharge to inpatient rehab in  1-2 days pending their approval  Katherine Roan, MD 02/18/2018, 9:05 AM Vickki Muff MD PGY-1 Internal Medicine Pager # [phone]

## 2018-02-19 LAB — BASIC METABOLIC PANEL
ANION GAP: 11 (ref 5–15)
BUN: 15 mg/dL (ref 6–20)
CO2: 24 mmol/L (ref 22–32)
Calcium: 9.4 mg/dL (ref 8.9–10.3)
Chloride: 105 mmol/L (ref 101–111)
Creatinine, Ser: 1.36 mg/dL — ABNORMAL HIGH (ref 0.44–1.00)
GFR calc non Af Amer: 40 mL/min — ABNORMAL LOW (ref >60)
GFR, EST AFRICAN AMERICAN: 46 mL/min — AB (ref >60)
Glucose, Bld: 204 mg/dL — ABNORMAL HIGH (ref 65–99)
POTASSIUM: 4 mmol/L (ref 3.5–5.1)
SODIUM: 140 mmol/L (ref 135–145)

## 2018-02-19 LAB — GLUCOSE, CAPILLARY
GLUCOSE-CAPILLARY: 171 mg/dL — AB (ref 65–99)
Glucose-Capillary: 178 mg/dL — ABNORMAL HIGH (ref 65–99)
Glucose-Capillary: 155 mg/dL — ABNORMAL HIGH (ref 65–99)
Glucose-Capillary: 292 mg/dL — ABNORMAL HIGH (ref 65–99)
Glucose-Capillary: 269 mg/dL — ABNORMAL HIGH (ref 65–99)

## 2018-02-19 LAB — CBC
HEMATOCRIT: 30.7 % — AB (ref 36.0–46.0)
HEMOGLOBIN: 10.6 g/dL — AB (ref 12.0–15.0)
MCH: 29.9 pg (ref 26.0–34.0)
MCHC: 34.5 g/dL (ref 30.0–36.0)
MCV: 86.7 fL (ref 78.0–100.0)
Platelets: 260 10*3/uL (ref 150–400)
RBC: 3.54 MIL/uL — AB (ref 3.87–5.11)
RDW: 14.1 % (ref 11.5–15.5)
WBC: 4.9 10*3/uL (ref 4.0–10.5)

## 2018-02-19 LAB — VALPROIC ACID LEVEL: Valproic Acid Lvl: 43 ug/mL — ABNORMAL LOW (ref 50.0–100.0)

## 2018-02-19 LAB — T4, FREE: FREE T4: 0.82 ng/dL (ref 0.61–1.12)

## 2018-02-19 LAB — TSH: TSH: 0.269 u[IU]/mL — ABNORMAL LOW (ref 0.350–4.500)

## 2018-02-19 MED ORDER — DIVALPROEX SODIUM 250 MG PO DR TAB
500.0000 mg | DELAYED_RELEASE_TABLET | Freq: Three times a day (TID) | ORAL | Status: DC
  Administered 2018-02-19 – 2018-02-21 (×5): 500 mg via ORAL
  Filled 2018-02-19 (×5): qty 2

## 2018-02-19 MED ORDER — LACOSAMIDE 50 MG PO TABS
100.0000 mg | ORAL_TABLET | Freq: Two times a day (BID) | ORAL | Status: DC
  Administered 2018-02-19 – 2018-02-22 (×6): 100 mg via ORAL
  Filled 2018-02-19 (×6): qty 2

## 2018-02-19 MED ORDER — SODIUM CHLORIDE 0.9 % IV SOLN
200.0000 mg | Freq: Once | INTRAVENOUS | Status: AC
  Administered 2018-02-19: 200 mg via INTRAVENOUS
  Filled 2018-02-19: qty 20

## 2018-02-19 MED ORDER — BACLOFEN 10 MG PO TABS
5.0000 mg | ORAL_TABLET | Freq: Three times a day (TID) | ORAL | Status: DC | PRN
  Filled 2018-02-19: qty 1

## 2018-02-19 NOTE — Progress Notes (Addendum)
   Subjective:  Pt alert, reports doing well, spasticity in left arm, working well with PT, no nausea or vomiting.  Objective:  Vital signs in last 24 hours: Vitals:   02/18/18 1958 02/18/18 2346 02/19/18 0338 02/19/18 0733  BP: (!) 154/78 (!) 145/56 (!) 147/81 (!) 144/74  Pulse: 81 70 78 71  Resp: 16 16 16 16   Temp: 98 F (36.7 C) 98.4 F (36.9 C) 98.2 F (36.8 C) 97.7 F (36.5 C)  TempSrc: Oral Oral Oral Oral  SpO2: 99% 94% 100% 100%  Weight:      Height:       Physical Exam  Constitutional: She is well-developed, well-nourished, and in no distress.  Cardiovascular: Normal rate and regular rhythm. Exam reveals no gallop and no friction rub.  Murmur (early systolic murmur 2/6) heard. Pulmonary/Chest: Effort normal and breath sounds normal. No respiratory distress. She has no wheezes. She has no rales.  Abdominal: Soft. She exhibits no distension.  Musculoskeletal: She exhibits no edema.  Neurological:  Left sided weakness with improvement, facial droop of left face improved  Skin: Skin is warm and dry.     Assessment/Plan:  Active Problems:   Hypothyroidism   Diabetes mellitus with stage 3 chronic kidney disease (HCC)   Hyperlipidemia   HYPERTENSION, BENIGN SYSTEMIC   PVD (peripheral vascular disease) (HCC)   CKD (chronic kidney disease), stage III (H[facility])   UTI (urinary tract infection)   CVA (cerebral vascular accident) ([place])   Seizure ([place])  CVA Patient had an MRI head showing subcentimeter acute/early subacute infarction in the right mid corona radiata with a few adjacent punctate foci. No hemorrhage or mass effect noted.  MRA head with no large vessel occlusion or large aneurysm identified.  Neurology consulted and completing stroke workup.   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 Holding home blood pressure medications allowing for permissive hypertension  -will continue permissive htn pending results of repeat MRI  T2DM HgbA1C 8.9  -CBG TID with meals, qHS -70/30 26 units BID WC  CKD Stage 3: Cr at baseline of 1.4-1.6.   -Continue to monitor while inpatient.   VTE Prophylaxis: Heparin TID Code Status: Full   Dispo: Anticipated discharge to inpatient rehab in 1-2 days pending stability of seizure activity  Katherine Roan, MD 02/19/2018, 10:56 AM Vickki Muff MD PGY-1 Internal Medicine Pager # [phone]

## 2018-02-19 NOTE — Discharge Summary (Signed)
Name: [name] MRN: [id number] DOB: 1951-08-28 67 y.o. PCP: [name], MD  Date of Admission: 02/15/2018 10:52 PM Date of Discharge: 02/22/2018 Attending Physician: Algis Greenhouse Discharge Diagnosis: 1.  Active Problems:   Hypothyroidism   Diabetes mellitus with stage 3 chronic kidney disease (HCC)   Hyperlipidemia   HYPERTENSION, BENIGN SYSTEMIC   PVD (peripheral vascular disease) (HCC)   CKD (chronic kidney disease), stage III (HCC)   UTI (urinary tract infection)   CVA (cerebral vascular accident) ([place])   Seizure ([place])   Muscle spasm   Vascular headache   Discharge Medications: Allergies as of 02/22/2018      Reactions   Dilantin [phenytoin Sodium Extended]    Rash/itching   Insulins Rash   Humalog 75/25 only   Statins    "sick to my stomach"      Medication List    STOP taking these medications   sulfamethoxazole-trimethoprim 800-160 MG tablet Commonly known as:  BACTRIM DS,SEPTRA DS     TAKE these medications   baclofen 10 MG tablet Commonly known as:  LIORESAL Take 1 tablet (10 mg total) by mouth 3 (three) times daily.   BD INSULIN SYRINGE ULTRAFINE 31G X 15/64" 0.5 ML Misc Generic drug:  Insulin Syringe-Needle U-100 USE TO INJECT INSULIN TWO TIMES A DAY   cilostazol 100 MG tablet Commonly known as:  PLETAL Take 100 mg by mouth 2 (two) times daily.   clopidogrel 75 MG tablet Commonly known as:  PLAVIX TAKE 1 TABLET BY MOUTH EVERY MORNING   gemfibrozil 600 MG tablet Commonly known as:  LOPID TAKE 1 TABLET BY MOUTH TWICE A DAY   glucose blood test strip Commonly known as:  ACCU-CHEK AVIVA PLUS Use to check blood sugars twice a day.Dx Code: E11.22.   hydrochlorothiazide 25 MG tablet Commonly known as:  HYDRODIURIL Take 1 tablet (25 mg total) by mouth daily.   Insulin Isophane & Regular Human (70-30) 100 UNIT/ML PEN Commonly known as:  NOVOLIN 70/30 FLEXPEN Inject 36 Units into the skin daily.   Insulin Pen Needle 31G X 5 MM  Misc Commonly known as:  B-D UF III MINI PEN NEEDLES The patient is insulin requiring, ICD 10 code E11.9. The patient injects insulin 2 times per day.   levothyroxine 88 MCG tablet Commonly known as:  SYNTHROID, LEVOTHROID TAKE 1 TABLET BY MOUTH EVERY DAY BEFORE BREAKFAST   liraglutide 18 MG/3ML Sopn Commonly known as:  VICTOZA Inject 0.2 mLs (1.2 mg total) into the skin daily.   losartan 100 MG tablet Commonly known as:  COZAAR TAKE 1 TABLET BY MOUTH EVERY DAY   MULTIVITAMIN ADULT PO Take 1 tablet by mouth once a week. On Wednesday   ranitidine 150 MG tablet Commonly known as:  ZANTAC TAKE 1 TABLET BY MOUTH EVERY DAY AS NEEDED       Disposition and follow-up:   [name] was discharged from [facility] in stable condition.  At the hospital follow up visit please address:  CVA  -pt will need to make follow up appointment with Dr. Erlinda Hong Neurology -continue to titrate baclofen for spasticity -consider implantable cardiac monitor -pt statin intolerant currently on lopid, consider PCSK9 inhibitor  T2DM  -continue to titrate insulin therapy -pt extremely reluctant to change diet while inpatient will need continued encouragement   2.  Labs / imaging needed at time of follow-up: bmp. cbc  3.  Pending labs/ test needing follow-up: none  Follow-up Appointments: Follow-up Information  [name], MD. Schedule an appointment as soon as possible for a visit in 4 week(s).   Specialty:  Neurology Contact information: [address] [place] [address] [facility] Course by problem list: Active Problems:   Hypothyroidism   Diabetes mellitus with stage 3 chronic kidney disease (HCC)   Hyperlipidemia   HYPERTENSION, BENIGN SYSTEMIC   PVD (peripheral vascular disease) (HCC)   CKD (chronic kidney disease), stage III ([place])   UTI (urinary tract infection)   CVA (cerebral vascular accident) ([place])   Seizure  ([place])   Muscle spasm   Vascular headache   Patient arrived to the emergency department for further workup of continued right sided stroke symptoms.  She was having headache, left-sided facial droop left-sided subtle weakness.  Additionally she was initially having what was felt to be seizure activity.  The patient had a repeat CT head 6 days after the one ordered after her clinic visit.  It was suggestive of an acute infarct in the right periventricular and insular region with no signs of intracranial hemorrhage.  This was followed by an MRI which showed a acute/early subacute infarction in right mid corona radiata with a few adjacent punctate foci.  Additionally some small chronic infarcts in the right chin new the corpus callosum in the right hemi-pons.  As the patient was out of the therapeutic window medical management was initiated.  Her stroke workup with studies below showed no obvious causes of the stroke. The patient's course was complicated by seizure-like activity with spasm-like movements of her left arm and episodes where her head would turn to the left as well.  Neurology felt that she may be having seizures due to her stroke.  She was placed on several antiepileptic drugs however she continued to have these episodes.  Neurology continue to try that and titrate and use different anti epileptic drug therapy.  They also performed to 24-hour video EEGs.  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  During this time the patient was evaluated by CIR, unfortunately while the seizure-like activity was being sorted out the were not able to accept the patient.  Once it was determined that the seizure-like activity was actually spasticity the patient was taken off all antiepileptic drug therapy and placed on baclofen and she was discharged to CIR.  Discharge Vitals:   BP (!) 155/79 (BP Location: Left Arm)   Pulse 95    Temp 97.7 F (36.5 C) (Oral)   Resp 18   Ht 5\' 2"  (1.575 m)   Wt 167 lb 8.8 oz (76 kg)   SpO2 100%   BMI 30.65 kg/m   Pertinent Labs, Studies, and Procedures:    CLINICAL DATA:  TIA symptoms since Sunday. Left-sided facial droop, left-sided weakness, and right temporal headache.   EXAM: CT HEAD WITHOUT CONTRAST   TECHNIQUE: Contiguous axial images were obtained from the base of the skull through the vertex without intravenous contrast.   COMPARISON:  02/10/2018   FINDINGS: Brain: Diffuse cerebral atrophy. Low-attenuation changes in the deep white matter suggesting small vessel ischemia. Increased asymmetrical low-attenuation change in the right periventricular white matter and insular region since previous study. This may represent area of acute infarct. MRI would be more sensitive for detection of acute infarct. No significant mass effect or midline shift. Basal cisterns are not effaced. No acute intracranial hemorrhage.  Vascular: Intracranial arterial vascular calcifications are present.   Skull: Calvarium appears intact. No acute depressed skull fractures.   Sinuses/Orbits: No acute finding.   Other: None.   IMPRESSION: Suggestion of acute infarct in the right periventricular and insular region. Consider MRI correlation if clinically indicated. No acute intracranial hemorrhage or mass effect.     Electronically Signed   By: Lucienne Capers M.D.   On: 02/16/2018 00:21  CLINICAL DATA:  67 y/o F; peripheral vascular disease and stroke affecting the left side. Recent progressive intermittent left-sided weakness. Episode of seizure-like activity.   EXAM: MRI HEAD WITHOUT CONTRAST   MRA HEAD WITHOUT CONTRAST   TECHNIQUE: Multiplanar, multiecho pulse sequences of the brain and surrounding structures were obtained without intravenous contrast. Angiographic images of the head were obtained using MRA technique without contrast.   COMPARISON:   02/15/2018 CT head. 01/28/2011 MRI and MRA of the head.   FINDINGS: MRI HEAD FINDINGS   Brain: 9 mm focus of reduced diffusion in the right mid corona radiata with few additional punctate adjacent foci compatible with acute/early subacute infarction. No associated hemorrhage or mass effect.   Fewnonspecific foci of T2 FLAIR hyperintense signal abnormality in subcortical and periventricular white matter are compatible withmildchronic microvascular ischemic changes for age. Mildbrain parenchymal volume loss. Small chronic infarctions are present within the right genu of corpus callosum, right body of corpus callosum, and right hemi pons. No extra-axial collection, hydrocephalus, or effacement of basilar cisterns.   Vascular: As below.   Skull and upper cervical spine: Normal marrow signal.   Sinuses/Orbits: Negative.   Other: None.   MRA HEAD FINDINGS   Severe motion artifact. Suboptimal assessment for stenosis or small aneurysm. No large vessel occlusion or large aneurysm identified.   IMPRESSION: MRI head:   1. Subcentimeter acute/early subacute infarction and right mid corona radiata with few adjacent punctate foci. No associated hemorrhage or mass effect. 2. Mild chronic microvascular ischemic changes and mild parenchymal volume loss of the brain. 3. Small chronic infarcts in right genu of corpus callosum, right body of corpus callosum, and right hemi pons.   MRA head:   Severe motion artifact. Suboptimal assessment for stenosis or small aneurysm. No large vessel occlusion or large aneurysm identified.   These results will be called to the ordering clinician or representative by the Radiologist Assistant, and communication documented in the PACS or zVision Dashboard.     Electronically Signed   By: Kristine Garbe M.D.   On: 02/16/2018 03:50  CLINICAL DATA:  67 y/o F; peripheral vascular disease and stroke affecting the left side. Recent progressive  intermittent left-sided weakness. Episode of seizure-like activity.   EXAM: MRI HEAD WITHOUT CONTRAST   MRA HEAD WITHOUT CONTRAST   TECHNIQUE: Multiplanar, multiecho pulse sequences of the brain and surrounding structures were obtained without intravenous contrast. Angiographic images of the head were obtained using MRA technique without contrast.   COMPARISON:  02/15/2018 CT head. 01/28/2011 MRI and MRA of the head.   FINDINGS: MRI HEAD FINDINGS   Brain: 9 mm focus of reduced diffusion in the right mid corona radiata with few additional punctate adjacent foci compatible with acute/early subacute infarction. No associated hemorrhage or mass effect.   Fewnonspecific foci of T2 FLAIR hyperintense signal abnormality in subcortical and periventricular white matter are compatible withmildchronic microvascular ischemic changes for age. Mildbrain parenchymal volume loss. Small chronic infarctions are present within the right genu of corpus callosum, right body of corpus callosum, and right hemi pons. No  extra-axial collection, hydrocephalus, or effacement of basilar cisterns.   Vascular: As below.   Skull and upper cervical spine: Normal marrow signal.   Sinuses/Orbits: Negative.   Other: None.   MRA HEAD FINDINGS   Severe motion artifact. Suboptimal assessment for stenosis or small aneurysm. No large vessel occlusion or large aneurysm identified.   IMPRESSION: MRI head:   1. Subcentimeter acute/early subacute infarction and right mid corona radiata with few adjacent punctate foci. No associated hemorrhage or mass effect. 2. Mild chronic microvascular ischemic changes and mild parenchymal volume loss of the brain. 3. Small chronic infarcts in right genu of corpus callosum, right body of corpus callosum, and right hemi pons.   MRA head:   Severe motion artifact. Suboptimal assessment for stenosis or small aneurysm. No large vessel occlusion or large aneurysm  identified.   These results will be called to the ordering clinician or representative by the Radiologist Assistant, and communication documented in the PACS or zVision Dashboard.     Electronically Signed   By: Kristine Garbe M.D.   On: 02/16/2018 03:50  Result status: Final result                              *[facility]*                         [address], [place] [address]                            [phone]   ------------------------------------------------------------------- Transthoracic Echocardiography   Patient:    Rhylie, Stehr MR #:       [id number] Study Date: 02/17/2018 Gender:     F Age:        60 Height:     157.5 cm Weight:     76 kg BSA:        1.85 m^2 Pt. Status: Room:       [place], [place], Inpatient  ADMITTING    Aldine Contes [id number]  ATTENDING    Aldine Contes [id number]  [id number]     OVFIEPPI, [place]  [place] [id number]  SONOGRAPHER  Dance, Tiffany   cc:   ------------------------------------------------------------------- LV EF: 60% -   65%   ------------------------------------------------------------------- Indications:      CVA 73.   ------------------------------------------------------------------- History:   Risk factors:  GERD. Hypertension. Diabetes mellitus. Dyslipidemia.   ------------------------------------------------------------------- Study Conclusions   - Left ventricle: The cavity size was normal. Wall thickness was   normal. Systolic function was normal. The estimated ejection   fraction was in the range of 60% to 65%. Wall motion was normal;   there were no regional wall motion abnormalities. Doppler   parameters are consistent with abnormal left ventricular   relaxation (grade 1 diastolic dysfunction).     ------------------------------------------------------------------- Study data:  Comparison was made to the study of 03/07/2012.  Study status:  Routine.  Procedure:  The patient reported no pain pre or post test. Transthoracic echocardiography. Image quality was  adequate.  Study completion:  There were no complications. Transthoracic echocardiography.  M-mode, complete 2D, spectral Doppler, and color Doppler.  Birthdate:  Patient birthdate: Jun 29, 1951.  Age:  Patient is 67 yr old.  Sex:  Gender: female. BMI: 30.6 kg/m^2.  Blood pressure:     150/66  Patient status: Inpatient.  Study date:  Study date: 02/17/2018. Study time: 02:32 PM.  Location:  Bedside.   -------------------------------------------------------------------   ------------------------------------------------------------------- Left ventricle:  The cavity size was normal. Wall thickness was normal. Systolic function was normal. The estimated ejection fraction was in the range of 60% to 65%. Wall motion was normal; there were no regional wall motion abnormalities. Doppler parameters are consistent with abnormal left ventricular relaxation (grade 1 diastolic dysfunction).   ------------------------------------------------------------------- Aortic valve:   Structurally normal valve.   Cusp separation was normal.  Doppler:  Transvalvular velocity was within the normal range. There was no stenosis. There was no regurgitation.   ------------------------------------------------------------------- Aorta:  Aortic root: The aortic root was normal in size. Ascending aorta: The ascending aorta was normal in size.   ------------------------------------------------------------------- Mitral valve:   Structurally normal valve.   Leaflet separation was normal.  Doppler:  Transvalvular velocity was within the normal range. There was no evidence for stenosis. There was no regurgitation.    Peak gradient (D): 2 mm Hg.    ------------------------------------------------------------------- Left atrium:  The atrium was normal in size.   ------------------------------------------------------------------- Right ventricle:  The cavity size was normal. Systolic function was normal.   ------------------------------------------------------------------- Pulmonic valve:    The valve appears to be grossly normal. Doppler:  There was no significant regurgitation.   ------------------------------------------------------------------- Tricuspid valve:   Structurally normal valve.   Leaflet separation was normal.  Doppler:  Transvalvular velocity was within the normal range. There was no significant regurgitation.   ------------------------------------------------------------------- Right atrium:  The atrium was normal in size.   ------------------------------------------------------------------- Pericardium:  There was no pericardial effusion.   ------------------------------------------------------------------- Measurements    Left ventricle                          Value        Reference  LV ID, ED, PLAX chordal         (L)     34.6  mm     43 - 52  LV ID, ES, PLAX chordal                 26.6  mm     23 - 38  LV fx shortening, PLAX chordal  (L)     23    %      >=29  LV PW thickness, ED                     11.2  mm     ----------  IVS/LV PW ratio, ED                     0.96         <=1.3  LV e&', lateral                          6.74  cm/s   ----------  LV E/e&', lateral                        10.53        ----------  LV  e&', medial                           5     cm/s   ----------  LV E/e&', medial                         14.2         ----------  LV e&', average                          5.87  cm/s   ----------  LV E/e&', average                        12.1         ----------    Ventricular septum                      Value        Reference  IVS thickness, ED                       10.8  mm      ----------    LVOT                                    Value        Reference  LVOT ID, S                              19    mm     ----------  LVOT area                               2.84  cm^2   ----------    Aorta                                   Value        Reference  Aortic root ID, ED                      29    mm     ----------  Ascending aorta ID, A-P, S              30    mm     ----------    Left atrium                             Value        Reference  LA ID, A-P, ES                          35    mm     ----------  LA ID/bsa, A-P                          1.89  cm/m^2 <=2.2  LA volume, S  47.5  ml     ----------  LA volume/bsa, S                        25.7  ml/m^2 ----------  LA volume, ES, 1-p A4C                  46.1  ml     ----------  LA volume/bsa, ES, 1-p A4C              24.9  ml/m^2 ----------  LA volume, ES, 1-p A2C                  45.7  ml     ----------  LA volume/bsa, ES, 1-p A2C              24.7  ml/m^2 ----------    Mitral valve                            Value        Reference  Mitral E-wave peak velocity             71    cm/s   ----------  Mitral A-wave peak velocity             127   cm/s   ----------  Mitral deceleration time        (H)     275   ms     150 - 230  Mitral peak gradient, D                 2     mm Hg  ----------  Mitral E/A ratio, peak                  0.5          ----------    Right atrium                            Value        Reference  RA ID, S-I, ES, A4C                     46.4  mm     34 - 49  RA area, ES, A4C                        10.8  cm^2   8.3 - 19.5  RA volume, ES, A/L                      20.5  ml     ----------  RA volume/bsa, ES, A/L                  11.1  ml/m^2 ----------    Right ventricle                         Value        Reference  RV ID, minor axis, ED, A4C base         23.2  mm     ----------  TAPSE                                   25.6  mm     ----------  RV s&', lateral, S                        13.6  cm/s   ----------   Legend: (L)  and  (H)  mark values outside specified reference range.   ------------------------------------------------------------------- Prepared and Electronically Authenticated by   Mertie Moores, M.D. 2019-04-07T17:18:48  Electroencephalogram report- LTM    Data acquisition: 10-20 electrode placement.  Additional T1, T2, and EKG electrodes; 26 channel digital referential acquisition reformatted to 18 channel/7 channel coronal bipolar     Spike detection: ON     Seizure detection: ON    Beginning time: 02/21/18 at 08 34 am   Ending time: 02/21/18 at 08 54 am   CPT: [id number] Day of study: day 2     This  intensive EEG monitoring with simultaneous video monitoring was performed for this patient with spells left-sided weakness as a part of ongoing series to capture events of interest and determine if these are seizures.     Medications as per EMR   Day 1 There was no pushbutton activations events during this recording.  Automated spike detection program did not detect any spikes. Seizure detection program did not detect any seizures .    Waking background activities were marked by 8 cps posterior dominant developed on the as opposed to right.  There is a persistent right hemispheric delta slowing involving the right frontotemporal region throughout the recording.  There is no interictal epileptiform discharges.  No clinical or subclinical seizures present.   Day 2:  numerous events of interest were recorded marked by difficulty using left arm, some tremulousness particular with intention or action or trying to utilize left arm.  Sometimes patients opening and closing his left fist.  Sometimes there is a she is trying to move her fingers.  Time there is a more complex left arm movements side to side up and down in a synchronous fashion events appeared to be in waxing and waning fashion sometimes lasting a long time however decreased with  rest and increased with again trying to utilize left arm.  At times patient also complaining of left face been" drawn "" simultaneously with the left arm movements.  With patient is completely awake alert and aware during these spells.  At times she is eating and utilizing left arm during that time accompanied by numerous pushbutton events as patient experiencing potentially some difficulties in using the left arm.   Electrographically was all these numerous events of interest there is unchanged waking background activity is present prior during and after these events of interest.   Semiology of these events of interest are inconsistent with seizures.  In addition left facial drawing and asymmetry have volitional component possibly.  However left arm movements and difficulty associated with utilizing left arm could be related to other etiologies including underlying left arm weakness itself possibly some movement disorder.   Background activities were unchanged from previously recorded in marked by right frontotemporal slowing suggestive of neuronal dysfunction in the right frontotemporal region.   Clinical interpretation: This day 2 of intensive EEG monitoring with simultaneous video monitoring did not record any clinical subclinical seizures.  EEG was abnormal due to right frontotemporal delta slowing suggestive of neuronal dysfunction on a structural vascular or degenerative basis.   Numerous events of interest were recorded involving left arm and left face as discussed above in details.  There was no any EEG  changes to suggest seizures.  However sometimes with simple partial seizures that there is no significant EEG changes.  However semiology of this events are inconsistent with simple partial seizures.  Again could be related to underlying weakness itself, movement disorder versus some volitional component associated with this events.  Clinical correlation is advised.   Discharge  Instructions: Discharge Instructions    Ambulatory referral to Neurology   Complete by:  As directed    Pt will follow up with Dr. Erlinda Hong at [facility] in about 4 weeks. Thanks.   Diet - low sodium heart healthy   Complete by:  As directed    Discharge instructions   Complete by:  As directed    On discharge follow up with Neurology and your primary care provider   Increase activity slowly   Complete by:  As directed       Signed: Katherine Roan, MD 02/23/2018, 1:35 PM

## 2018-02-19 NOTE — Progress Notes (Signed)
Nurse witness patient having seizure like activity. Her left hand began to shake involuntarily. She held it with her right hand.  Patient stayed alert and oriented and it lasted about 30 sec. She was aware that it was  happening and Stated " It will stop in a sec." MD is aware of these Episodes. Nurse will continue to monitor.

## 2018-02-19 NOTE — Progress Notes (Signed)
Internal Medicine Attending:   I saw and examined the patient. I reviewed the resident's note and I agree with the resident's findings and plan as documented in the resident's note.  Patient reports intermittent shaking of her left arm with weakness following the shaking.  Patient was initially admitted for acute CVA whose hospital course was complicated by recurrent seizures.  Neuro follow-up and recommendations appreciated.  It is likely that the shaking movements in her left arm are secondary to recurrent seizures (simple partial seizures).  We will continue with Keppra for now.  Patient had her Depakote increased to 500 mg 3 times daily and also had Vimpat added to her regimen.  We will check Depakote level in the morning.  If patient continues to improve she will be stable for discharge to CIR tomorrow.

## 2018-02-19 NOTE — Progress Notes (Signed)
STROKE TEAM PROGRESS NOTE   SUBJECTIVE (INTERVAL HISTORY) Pt CIR coordinator Melissa at bedside. Melissa told me that while she was talking to pt and pt started to have left arm shaking jerking and lasted about 17min. After that pt had weakness of left arm but getting better. During the round, pt had another episode of left arm shaking jerking but pt full awake and alert and able to continue conversation with me and answers questions appropriately. Pt said she had similar shaking 5 times over night and 3 times this am. She can feel it's coming on but not able to control it. The episode did resemble simple partial seizure with todd's paralysis. LLE not involved. Her depakote level 43 this am, will need to increase to tid dosing. Will also load with vimpat and started the 3rd AED for seizure control. BMP unremarkable except hyperglycemia at 200s.    OBJECTIVE Temp:  [97.7 F (36.5 C)-98.4 F (36.9 C)] 98.2 F (36.8 C) (04/09 1231) Pulse Rate:  [70-81] 78 (04/09 1231) Cardiac Rhythm: Normal sinus rhythm (04/09 0700) Resp:  [15-18] 18 (04/09 1231) BP: (133-154)/(56-84) 133/84 (04/09 1231) SpO2:  [94 %-100 %] 100 % (04/09 1231)  CBC:  Recent Labs  Lab 02/15/18 2328  02/18/18 0245 02/19/18 0540  WBC 7.0   < > 5.6 4.9  NEUTROABS 4.4  --   --   --   HGB 10.9*   < > 9.6* 10.6*  HCT 33.8*   < > 28.9* 30.7*  MCV 89.2   < > 87.3 86.7  PLT 275   < > 245 260   < > = values in this interval not displayed.    Basic Metabolic Panel:  Recent Labs  Lab 02/18/18 0245 02/19/18 0540  NA 138 140  K 3.9 4.0  CL 105 105  CO2 21* 24  GLUCOSE 153* 204*  BUN 18 15  CREATININE 1.35* 1.36*  CALCIUM 9.1 9.4  MG 2.1  --     Lipid Panel:     Component Value Date/Time   CHOL 168 02/16/2018 0200   CHOL 200 (H) 10/18/2016 1518   TRIG 255 (H) 02/16/2018 0200   HDL 30 (L) 02/16/2018 0200   HDL 34 (L) 10/18/2016 1518   CHOLHDL 5.6 02/16/2018 0200   VLDL 51 (H) 02/16/2018 0200   LDLCALC 87  02/16/2018 0200   LDLCALC 99 10/18/2016 1518   HgbA1c:  Lab Results  Component Value Date   HGBA1C 8.9 (H) 02/16/2018   Urine Drug Screen:     Component Value Date/Time   LABOPIA NONE DETECTED 02/16/2018 1201   COCAINSCRNUR NONE DETECTED 02/16/2018 1201   LABBENZ NONE DETECTED 02/16/2018 1201   AMPHETMU NONE DETECTED 02/16/2018 1201   THCU NONE DETECTED 02/16/2018 1201   LABBARB NONE DETECTED 02/16/2018 1201    Alcohol Level     Component Value Date/Time   ETH <10 02/15/2018 2328    IMAGING I have personally reviewed the radiological images below and agree with the radiology interpretations.  Ct Head Wo Contrast 02/16/2018 IMPRESSION:  Suggestion of acute infarct in the right periventricular and insular region. Consider MRI correlation if clinically indicated. No acute intracranial hemorrhage or mass effect.   Mr Brain Wo Contrast 02/16/2018 MRI head:  1. Subcentimeter acute/early subacute infarction and right mid corona radiata with few adjacent punctate foci. No associated hemorrhage or mass effect.  2. Mild chronic microvascular ischemic changes and mild parenchymal volume loss of the brain.  3. Small chronic infarcts in  right genu of corpus callosum, right body of corpus callosum, and right hemi pons.  MRA head:  Severe motion artifact. Suboptimal assessment for stenosis or small aneurysm. No large vessel occlusion or large aneurysm identified.   Transthoracic Echocardiogram - Left ventricle: The cavity size was normal. Wall thickness was   normal. Systolic function was normal. The estimated ejection   fraction was in the range of 60% to 65%. Wall motion was normal;   there were no regional wall motion abnormalities. Doppler   parameters are consistent with abnormal left ventricular   relaxation (grade 1 diastolic dysfunction).  Bilateral Carotid Dopplers  02/16/2018 1-39% ICA plaquing. Vertebral artery flow is antegrade.   EEG This normal EEG is recorded in the  waking and sleep state. There was no seizure or seizure predisposition recorded on this study. Please note that a normal EEG does not preclude the possibility of epilepsy.    PHYSICAL EXAM Temp:  [97.7 F (36.5 C)-98.4 F (36.9 C)] 98.2 F (36.8 C) (04/09 1231) Pulse Rate:  [70-81] 78 (04/09 1231) Resp:  [15-18] 18 (04/09 1231) BP: (133-154)/(56-84) 133/84 (04/09 1231) SpO2:  [94 %-100 %] 100 % (04/09 1231)  General - Well nourished, well developed, in no apparent distress.  Ophthalmologic - Fundi not visualized due to small pupils.  Cardiovascular - Regular rate and rhythm.  Mental Status -  Level of arousal and orientation to time, place, and person were intact. Language including expression, naming, repetition, comprehension was assessed and found intact. Fund of Knowledge was assessed and was intact.  Cranial Nerves II - XII - II - Visual field intact OU. III, IV, VI - Extraocular movements intact. V - Facial sensation intact bilaterally. VII - left nasolabial fold flattening. VIII - Hearing & vestibular intact bilaterally. X - Palate elevates symmetrically. XI - Chin turning & shoulder shrug intact bilaterally. XII - Tongue protrusion intact.  Motor Strength - The patient's strength was normal in all limbs except 4/5 LUE proximal and distally.  Bulk was normal and fasciculations were absent.   Motor Tone - Muscle tone was assessed at the neck and appendages and was normal.  Reflexes - The patient's reflexes were 1+ in all extremities and she had no pathological reflexes.  Sensory - Light touch, temperature/pinprick were assessed and were symmetrical.    Coordination - The patient had ataxia on the left arm with FTN testing.  Tremor was absent.  Gait and Station - deferred.   ASSESSMENT/PLAN [name] is a 67 y.o. female with history of peripheral vascular disease, stroke x2 affecting the left side with no residual deficits, hypertension, diabetes,  hyperlipidemia presenting with left-sided weakness, simple partial seizures, and a new headache. She did not receive IV t-PA due to late presentation.  Stroke:  subacute right mid corona radiata infarct, likely due to small vessel disease.  Resultant   Mild left facial droop and left arm incoordination   CT head - Suggestion of acute infarct in the right periventricular and insular region.  MRI head - Subcentimeter acute/early subacute infarction at right mid corona radiata with few adjacent punctate foci with multiple old infarcts.  MRA head - motion degraded but otherwise unremarkable  UDS - negative  Carotid Doppler - 1-39% ICA plaquing. Vertebral artery flow is antegrade.   2D Echo - EF 60-65%  LDL - 87  HgbA1c - 8.9  VTE prophylaxis - [place] heparin Fall precautions Seizure precautions Diet heart healthy/carb modified Room service appropriate? Yes; Fluid consistency: Thin  clopidogrel 75 mg daily and Pletal prior to admission, now on clopidogrel 75 mg daily and Pletal  Patient will be counseled to be compliant with her antithrombotic medications  Ongoing aggressive stroke risk factor management  Therapy recommendations:  pending  Disposition:  Pending  Simple partial seizure  Episodes of left eye gaze, left head turning, left arm tonic flexion at home  More episodes of left arm shaking with todd's paralysis during hospitalization   No LOC, able to converse well during episodes  On keppra, increase to 1000mg  bid  On depakote, increase to 500mg  tid  Will add vimpat 100mg  bid after 200mg  load  depakote level 59 -> 43, depakote level in am  EEG normal  Seizure could be due to current/prior stroke in the setting of UTI  No driving until seizure free for 6 months.   Hx of stroke  First one in 2004 and on plavix  01/2011 right CC stroke with left sided weakness, balance issue and slurry speech - residue of walking with hopping on the left side - on plavix and  platel  Hypertension  Stable  Long-term BP goal normotensive  Hyperlipidemia  Lipid lowering medication PTA: lopid,  Intolerant to statins  LDL 87, goal < 70  Current lipid lowering medication: lopid  Continue at discharge  Diabetes  HgbA1c 8.9, goal < 7.0  Uncontrolled  Off medication PTA  hyperglycemia  SSI  CBG monitoring  AKI  Cre 1.50->1.46->1.71->1.35->1.36  Encourage po intake  Close monitoring  Other Stroke Risk Factors  Advanced age  Former cigarette smoker - quit 7 years ago.  Obesity, Body mass index is 30.65 kg/m., recommend weight loss, diet and exercise as appropriate   PVD - on plavix and platel  Other Active Problems  Grave's disease s/p iodine ablation - on supplement - TSH  Low but FT4 normal  I spent  35 minutes in total face-to-face time with the patient, more than 50% of which was spent in counseling and coordination of care, reviewing test results, images and medication, and discussing the diagnosis of simple partial seizure, continued seizure activities, and its treatment plan. This patient's care requiresreview of multiple databases, neurological assessment, discussion with family, other specialists and medical decision making of high complexity. I had long discussion with pt, sister at bedside and daughter over the phone, updated pt current condition, treatment plan and potential prognosis. They expressed understanding and appreciation.   [name], MD PhD Stroke Neurology 02/19/2018 3:23 PM  To contact Stroke Continuity provider, please refer to [email or web address] After hours, contact General Neurology

## 2018-02-19 NOTE — Progress Notes (Signed)
Physical Therapy Treatment Patient Details Name: [name] MRN: [id number] DOB: [birth year] Today's Date: 02/19/2018    History of Present Illness 67 y.o. female admitted for HA, left sided weakness and slurred speech. Brain MR revealed acute to subacute right corona radiata infarct with a few adjacent punctate foci. PMH includes but not limited to hx of R genu CVA, DM, HTN, PVD, HLD, and GERD.    PT Comments    Pt received in bed and agreeable to participation in therapy. Pt with c/o BUE tremors. No tremors noted, however, during session. Pt ambulated 200 feet min assist without AD. She remains high fall risk due to instability in stance and impulsivity. Current POC remains appropriate.    Follow Up Recommendations  CIR     Equipment Recommendations  Other (comment)(defer to next venue)    Recommendations for Other Services Rehab consult     Precautions / Restrictions Precautions Precautions: Fall    Mobility  Bed Mobility   Bed Mobility: Supine to Sit;Sit to Supine     Supine to sit: HOB elevated;Min guard Sit to supine: HOB elevated;Min guard   General bed mobility comments: +rail, min guard for safety due to impulsive  Transfers Overall transfer level: Needs assistance Equipment used: None Transfers: Sit to/from Stand Sit to Stand: Min guard         General transfer comment: increased time to stabilize initial standing balance  Ambulation/Gait Ambulation/Gait assistance: Min assist Ambulation Distance (Feet): 200 Feet Assistive device: None Gait Pattern/deviations: Step-through pattern;Drifts right/left;Decreased stride length Gait velocity: decreased Gait velocity interpretation: Below normal speed for age/gender General Gait Details: unsteady gait, drifting right/left running into obstacles   Stairs            Wheelchair Mobility    Modified Rankin (Stroke Patients Only) Modified Rankin (Stroke Patients Only) Pre-Morbid Rankin Score: No  symptoms Modified Rankin: Moderately severe disability     Balance   Sitting-balance support: Feet supported;No upper extremity supported Sitting balance-Leahy Scale: Good     Standing balance support: No upper extremity supported;Single extremity supported Standing balance-Leahy Scale: Fair                              Cognition Arousal/Alertness: Awake/alert Behavior During Therapy: WFL for tasks assessed/performed;Impulsive(mildly impulsive) Overall Cognitive Status: Impaired/Different from baseline Area of Impairment: Attention;Memory;Following commands;Safety/judgement;Awareness                   Current Attention Level: Selective Memory: Decreased recall of precautions;Decreased short-term memory Following Commands: Follows one step commands with increased time;Follows multi-step commands inconsistently Safety/Judgement: Decreased awareness of safety;Decreased awareness of deficits Awareness: Emergent          Exercises      General Comments General comments (skin integrity, edema, etc.): VSS      Pertinent Vitals/Pain Pain Assessment: No/denies pain    Home Living                      Prior Function            PT Goals (current goals can now be found in the care plan section) Acute Rehab PT Goals Patient Stated Goal: to get better PT Goal Formulation: With patient/family Time For Goal Achievement: 03/03/18 Potential to Achieve Goals: Good Progress towards PT goals: Progressing toward goals    Frequency    Min 4X/week      PT Plan Current plan remains  appropriate    Co-evaluation              AM-PAC PT "6 Clicks" Daily Activity  Outcome Measure  Difficulty turning over in bed (including adjusting bedclothes, sheets and blankets)?: A Little Difficulty moving from lying on back to sitting on the side of the bed? : A Little Difficulty sitting down on and standing up from a chair with arms (e.g., wheelchair,  bedside commode, etc,.)?: A Little Help needed moving to and from a bed to chair (including a wheelchair)?: A Little Help needed walking in hospital room?: A Little Help needed climbing 3-5 steps with a railing? : A Lot 6 Click Score: 17    End of Session Equipment Utilized During Treatment: Gait belt Activity Tolerance: Patient tolerated treatment well Patient left: in bed;with call bell/phone within reach Nurse Communication: Mobility status PT Visit Diagnosis: Unsteadiness on feet (R26.81);Other abnormalities of gait and mobility (R26.89);Muscle weakness (generalized) (M62.81);History of falling (Z91.81);Difficulty in walking, not elsewhere classified (R26.2);Other symptoms and signs involving the nervous system (R29.898)     Time: 8295-6213 PT Time Calculation (min) (ACUTE ONLY): 15 min  Charges:  $Gait Training: 8-22 mins                    G Codes:       Lorrin Goodell, PT  Office # [phone] Pager [phone]    Lorriane Shire 02/19/2018, 10:28 AM

## 2018-02-19 NOTE — Progress Notes (Signed)
Inpatient Rehabilitation  I have received insurance authorization for an IP Rehab admission.  Plan to follow for timing of medical readiness.  Discussed with team.  Call if questions.   Carmelia Roller., CCC/SLP Admission Coordinator  [place]  Cell [phone]

## 2018-02-20 ENCOUNTER — Ambulatory Visit: Payer: Self-pay

## 2018-02-20 ENCOUNTER — Inpatient Hospital Stay (HOSPITAL_COMMUNITY): Payer: Medicare HMO

## 2018-02-20 LAB — GLUCOSE, CAPILLARY
GLUCOSE-CAPILLARY: 217 mg/dL — AB (ref 65–99)
Glucose-Capillary: 286 mg/dL — ABNORMAL HIGH (ref 65–99)
Glucose-Capillary: 245 mg/dL — ABNORMAL HIGH (ref 65–99)
Glucose-Capillary: 215 mg/dL — ABNORMAL HIGH (ref 65–99)

## 2018-02-20 LAB — BASIC METABOLIC PANEL
ANION GAP: 13 (ref 5–15)
BUN: 15 mg/dL (ref 6–20)
CHLORIDE: 103 mmol/L (ref 101–111)
CO2: 23 mmol/L (ref 22–32)
Calcium: 9.4 mg/dL (ref 8.9–10.3)
Creatinine, Ser: 1.33 mg/dL — ABNORMAL HIGH (ref 0.44–1.00)
GFR calc Af Amer: 47 mL/min — ABNORMAL LOW (ref >60)
GFR calc non Af Amer: 41 mL/min — ABNORMAL LOW (ref >60)
Glucose, Bld: 281 mg/dL — ABNORMAL HIGH (ref 65–99)
POTASSIUM: 4.4 mmol/L (ref 3.5–5.1)
Sodium: 139 mmol/L (ref 135–145)

## 2018-02-20 LAB — VALPROIC ACID LEVEL: VALPROIC ACID LVL: 56 ug/mL (ref 50.0–100.0)

## 2018-02-20 LAB — CBC
HEMATOCRIT: 30.4 % — AB (ref 36.0–46.0)
Hemoglobin: 10.1 g/dL — ABNORMAL LOW (ref 12.0–15.0)
MCH: 28.9 pg (ref 26.0–34.0)
MCHC: 33.2 g/dL (ref 30.0–36.0)
MCV: 86.9 fL (ref 78.0–100.0)
Platelets: 248 10*3/uL (ref 150–400)
RBC: 3.5 MIL/uL — AB (ref 3.87–5.11)
RDW: 14.3 % (ref 11.5–15.5)
WBC: 5.8 10*3/uL (ref 4.0–10.5)

## 2018-02-20 MED ORDER — INSULIN ASPART 100 UNIT/ML ~~LOC~~ SOLN
0.0000 [IU] | Freq: Every day | SUBCUTANEOUS | Status: DC
  Administered 2018-02-20: 2 [IU] via SUBCUTANEOUS

## 2018-02-20 MED ORDER — DEXTROSE 5 % IV SOLN
750.0000 mg | Freq: Once | INTRAVENOUS | Status: AC
  Administered 2018-02-20: 750 mg via INTRAVENOUS
  Filled 2018-02-20: qty 7.5

## 2018-02-20 MED ORDER — INSULIN ASPART 100 UNIT/ML ~~LOC~~ SOLN
0.0000 [IU] | Freq: Three times a day (TID) | SUBCUTANEOUS | Status: DC
  Administered 2018-02-20 – 2018-02-21 (×2): 3 [IU] via SUBCUTANEOUS
  Administered 2018-02-21: 2 [IU] via SUBCUTANEOUS
  Administered 2018-02-21: 3 [IU] via SUBCUTANEOUS
  Administered 2018-02-22: 2 [IU] via SUBCUTANEOUS
  Administered 2018-02-22: 5 [IU] via SUBCUTANEOUS

## 2018-02-20 MED ORDER — VALPROATE SODIUM 500 MG/5ML IV SOLN: 1000.0000 mg | Freq: Once | INTRAVENOUS | Status: DC

## 2018-02-20 MED ORDER — INSULIN ASPART PROT & ASPART (70-30 MIX) 100 UNIT/ML ~~LOC~~ SUSP
28.0000 [IU] | Freq: Two times a day (BID) | SUBCUTANEOUS | Status: DC
  Administered 2018-02-20 – 2018-02-21 (×3): 28 [IU] via SUBCUTANEOUS

## 2018-02-20 NOTE — Progress Notes (Signed)
STROKE TEAM PROGRESS NOTE   SUBJECTIVE (INTERVAL HISTORY) No family at bedside.  Patient stated that she had 4 seizures overnight.  Depakote level this morning 56, gave Depakote IV loading.  Will need to start long-term EEG.  I also talked to patient daughter over the phone, updated patient condition and treatment plan.   OBJECTIVE Temp:  [97.7 F (36.5 C)-98.2 F (36.8 C)] 98.1 F (36.7 C) (04/10 1230) Pulse Rate:  [70-85] 82 (04/10 1230) Cardiac Rhythm: Normal sinus rhythm (04/10 0700) Resp:  [18] 18 (04/10 1230) BP: (167-180)/(69-76) 170/76 (04/10 1230) SpO2:  [97 %-100 %] 97 % (04/10 1230)  CBC:  Recent Labs  Lab 02/15/18 2328  02/19/18 0540 02/20/18 0538  WBC 7.0   < > 4.9 5.8  NEUTROABS 4.4  --   --   --   HGB 10.9*   < > 10.6* 10.1*  HCT 33.8*   < > 30.7* 30.4*  MCV 89.2   < > 86.7 86.9  PLT 275   < > 260 248   < > = values in this interval not displayed.    Basic Metabolic Panel:  Recent Labs  Lab 02/18/18 0245 02/19/18 0540 02/20/18 0538  NA 138 140 139  K 3.9 4.0 4.4  CL 105 105 103  CO2 21* 24 23  GLUCOSE 153* 204* 281*  BUN 18 15 15   CREATININE 1.35* 1.36* 1.33*  CALCIUM 9.1 9.4 9.4  MG 2.1  --   --     Lipid Panel:     Component Value Date/Time   CHOL 168 02/16/2018 0200   CHOL 200 (H) 10/18/2016 1518   TRIG 255 (H) 02/16/2018 0200   HDL 30 (L) 02/16/2018 0200   HDL 34 (L) 10/18/2016 1518   CHOLHDL 5.6 02/16/2018 0200   VLDL 51 (H) 02/16/2018 0200   LDLCALC 87 02/16/2018 0200   LDLCALC 99 10/18/2016 1518   HgbA1c:  Lab Results  Component Value Date   HGBA1C 8.9 (H) 02/16/2018   Urine Drug Screen:     Component Value Date/Time   LABOPIA NONE DETECTED 02/16/2018 1201   COCAINSCRNUR NONE DETECTED 02/16/2018 1201   LABBENZ NONE DETECTED 02/16/2018 1201   AMPHETMU NONE DETECTED 02/16/2018 1201   THCU NONE DETECTED 02/16/2018 1201   LABBARB NONE DETECTED 02/16/2018 1201    Alcohol Level     Component Value Date/Time   ETH <10  02/15/2018 2328    IMAGING I have personally reviewed the radiological images below and agree with the radiology interpretations.  Ct Head Wo Contrast 02/16/2018 IMPRESSION:  Suggestion of acute infarct in the right periventricular and insular region. Consider MRI correlation if clinically indicated. No acute intracranial hemorrhage or mass effect.   Mr Brain Wo Contrast 02/16/2018 MRI head:  1. Subcentimeter acute/early subacute infarction and right mid corona radiata with few adjacent punctate foci. No associated hemorrhage or mass effect.  2. Mild chronic microvascular ischemic changes and mild parenchymal volume loss of the brain.  3. Small chronic infarcts in right genu of corpus callosum, right body of corpus callosum, and right hemi pons.  MRA head:  Severe motion artifact. Suboptimal assessment for stenosis or small aneurysm. No large vessel occlusion or large aneurysm identified.   Transthoracic Echocardiogram - Left ventricle: The cavity size was normal. Wall thickness was   normal. Systolic function was normal. The estimated ejection   fraction was in the range of 60% to 65%. Wall motion was normal;   there were no regional wall  motion abnormalities. Doppler   parameters are consistent with abnormal left ventricular   relaxation (grade 1 diastolic dysfunction).  Bilateral Carotid Dopplers  02/16/2018 1-39% ICA plaquing. Vertebral artery flow is antegrade.   EEG This normal EEG is recorded in the waking and sleep state. There was no seizure or seizure predisposition recorded on this study. Please note that a normal EEG does not preclude the possibility of epilepsy.   LTM EEG pending  MRI with and without contrast pending   PHYSICAL EXAM Temp:  [97.7 F (36.5 C)-98.2 F (36.8 C)] 98.1 F (36.7 C) (04/10 1230) Pulse Rate:  [70-85] 82 (04/10 1230) Resp:  [18] 18 (04/10 1230) BP: (167-180)/(69-76) 170/76 (04/10 1230) SpO2:  [97 %-100 %] 97 % (04/10 1230)  General -  Well nourished, well developed, in no apparent distress.  Ophthalmologic - Fundi not visualized due to small pupils.  Cardiovascular - Regular rate and rhythm.  Mental Status -  Level of arousal and orientation to time, place, and person were intact. Language including expression, naming, repetition, comprehension was assessed and found intact. Fund of Knowledge was assessed and was intact.  Cranial Nerves II - XII - II - Visual field intact OU. III, IV, VI - Extraocular movements intact. V - Facial sensation intact bilaterally. VII - left nasolabial fold flattening. VIII - Hearing & vestibular intact bilaterally. X - Palate elevates symmetrically. XI - Chin turning & shoulder shrug intact bilaterally. XII - Tongue protrusion intact.  Motor Strength - The patient's strength was normal in all limbs except 4/5 LUE proximal and distally.  Bulk was normal and fasciculations were absent.   Motor Tone - Muscle tone was assessed at the neck and appendages and was normal.  Reflexes - The patient's reflexes were 1+ in all extremities and she had no pathological reflexes.  Sensory - Light touch, temperature/pinprick were assessed and were symmetrical.    Coordination - The patient had ataxia on the left arm with FTN testing.  Tremor was absent.  Gait and Station - deferred.   ASSESSMENT/PLAN Ms. PHILIPPA VESSEY is a 67 y.o. female with history of peripheral vascular disease, stroke x2 affecting the left side with no residual deficits, hypertension, diabetes, hyperlipidemia presenting with left-sided weakness, simple partial seizures, and a new headache. She did not receive IV t-PA due to late presentation.  Stroke:  subacute right mid corona radiata infarct, likely due to small vessel disease.  Resultant   Mild left facial droop and left arm incoordination   CT head - Suggestion of acute infarct in the right periventricular and insular region.  MRI head - Subcentimeter acute/early  subacute infarction at right mid corona radiata with few adjacent punctate foci with multiple old infarcts.  MRA head - motion degraded but otherwise unremarkable  UDS - negative  Carotid Doppler - 1-39% ICA plaquing. Vertebral artery flow is antegrade.   2D Echo - EF 60-65%  LDL - 87  HgbA1c - 8.9  VTE prophylaxis - [place] heparin Fall precautions Seizure precautions Diet Carb Modified Fluid consistency: Thin; Room service appropriate? Yes  clopidogrel 75 mg daily and Pletal prior to admission, now on clopidogrel 75 mg daily and Pletal  Patient will be counseled to be compliant with her antithrombotic medications  Ongoing aggressive stroke risk factor management  Therapy recommendations: CIR  Disposition:  Pending  Simple partial seizure  Episodes of left eye gaze, left head turning, left arm tonic flexion at home  More episodes of left arm shaking with todd's  paralysis during hospitalization   No LOC, able to converse well during episodes  Still complains of ongoing seizure overnight last night  On keppra, increase to 1000mg  bid  On depakote, increase to 500mg  tid  Continue Vimpat 100mg  bid  depakote level 59 -> 43->56, given Depakote 750 load IV.    Depakote level in am  Spot EEG normal  Will need LTM EEG for seizure monitoring  No driving until seizure free for 6 months.   Hx of stroke  First one in 2004 and on plavix  01/2011 right CC stroke with left sided weakness, balance issue and slurry speech - residue of walking with hopping on the left side - on plavix and platel  Hypertension  Stable  Long-term BP goal normotensive  Hyperlipidemia  Lipid lowering medication PTA: lopid,  Intolerant to statins  LDL 87, goal < 70  Current lipid lowering medication: lopid  Continue at discharge  Diabetes  HgbA1c 8.9, goal < 7.0  Uncontrolled  Off medication PTA  hyperglycemia  SSI  CBG monitoring  AKI  Cre  1.50->1.46->1.71->1.35->1.36->1.33  Encourage po intake  Close monitoring  Other Stroke Risk Factors  Advanced age  Former cigarette smoker - quit 7 years ago.  Obesity, Body mass index is 30.65 kg/m., recommend weight loss, diet and exercise as appropriate   PVD - on plavix and platel  Other Active Problems  Grave's disease s/p iodine ablation - on supplement - TSH  Low but FT4 normal  Hospital day # 4    [name], MD PhD Stroke Neurology 02/20/2018 5:54 PM  To contact Stroke Continuity provider, please refer to [email or web address] After hours, contact General Neurology

## 2018-02-20 NOTE — PMR Pre-admission (Signed)
PMR Admission Coordinator Pre-Admission Assessment  Patient: [name] is an 67 y.o., female MRN: [id number] DOB: [birth year] Height: 5\' 2"  (157.5 cm) Weight: 76 kg (167 lb 8.8 oz)              Insurance Information HMO: X    PPO:      PCP:      IPA:      80/20:      OTHER:  PRIMARY: [employer]       Policy#: [id number]      Subscriber: Ssl[employer] CM Name: Cathie Beams      Phone#: [phone]     Fax#: [phone] Pre-Cert#: [id number]  with faxed weekly updates and discharge plan to case manager     Employer: Retired Benefits:  Phone #: [phone]     Name: Verified online at [email or web address] Eff. Date: 11/13/17     Deduct: $0      Out of Pocket Max: [phone]      Life Max: N/A CIR: $175 a day, days 1-7; $0 a day, days 8+      SNF: $0 a day, days 1-20; $172 a day, days 21-100 Outpatient: 80%     Co-Pay: 20% Home Health: 100%      Co-Pay: $0 DME: 80%     Co-Pay: 20% Providers: In-network   SECONDARY: Medicaid [place] Access      Policy#: [id number]      Subscriber: Self CM Name:       Phone#:      Fax#:  Pre-Cert#: Coverage Code: [id number]      Employer: Retired Benefits:  Phone #: [phone]     Name: Verified via automated system  Eff. 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 She had witnessed seizure with gaze to left and was loaded with dilantin. She was started on Keppra for management of new onset seizures. . MRI/MRA brain done revealing acute/early subacute infarct in right mid corona radiata with few adjacent punctate foci.  2D echo done revealing #F 60-65% with grade 1 diastolic dysfunction.  Carotid dopplers were negative for ICA stenosis.    She has had intermittent seizure activity (head turn to the left with left arm tonic flexion) therefore Depakote added and Keppra was increased to 1000 mg bid on 04/08. Pulling sensation LUE felt to be due to muscle spasms.  Family reported that medications not filled by pharmacy 3 weeks PTA. Dr. Leonie Man felt that stroke due to small vessel disease--to continue Plavix and  Pletal. Patient with resultant unsteady gait, poor safety with impulsivity affecting mobility and ADLs. Patient also with questionable seizure activity and Dr. Erlinda Hong had a long-term EEG completed, which revealed no correlation.  As a result, it is likely to be muscle spasms instead of seizures and medications were modified accordingly.  CIR recommended due to persistent functional deficits and patient admitted 02/22/18.    NIH Total: 3    Past Medical History  Past Medical History:  Diagnosis Date  . Anemia   . Arteriosclerotic cardiovascular disease   . Claudication in peripheral vascular disease ([place])   . CVA (cerebral vascular accident) ([place]) 01/2011   affected left side  . Diabetes mellitus 2000  . GERD (gastroesophageal reflux disease)   . Hyperlipidemia   . Hypertension   . Hypothyroidism   . Renal insufficiency   . Vitamin D deficiency     Family History  family history includes Diabetes in her brother, father, mother, and sister; Prostate cancer in her brother.  Prior Rehab/Hospitalizations:  Has the patient had major surgery during 100 days prior to admission? No  Current Medications   Current  Facility-Administered Medications:  .  acetaminophen (TYLENOL) tablet 650 mg, 650 mg, Oral, Q4H PRN, 650 mg at 02/20/18 0303 **OR** [DISCONTINUED] acetaminophen (TYLENOL) solution 650 mg, 650 mg, Per Tube, Q4H PRN **OR** acetaminophen (TYLENOL) suppository 650 mg, 650 mg, Rectal, Q4H PRN, Jule Ser, DO .  baclofen (LIORESAL) tablet 10 mg, 10 mg, Oral, TID, [name], MD .  cilostazol (PLETAL) tablet 100 mg, 100 mg, Oral, BID, Jule Ser, DO, 100 mg at 02/22/18 0836 .  clopidogrel (PLAVIX) tablet 75 mg, 75 mg, Oral, q morning - 10a, Jule Ser, DO, 75 mg at 02/22/18 0836 .  gemfibrozil (LOPID) tablet 600 mg, 600 mg, Oral, BID AC, Skeet Simmer, RPH, 600 mg at 02/22/18 6962 .  heparin injection 5,000 Units, 5,000 Units, Subcutaneous, Q8H, Jule Ser, DO, 5,000 Units at 02/22/18 9528 .  hydrochlorothiazide (HYDRODIURIL) tablet 25 mg, 25 mg, Oral, Daily, Hoffman, Jessica Ratliff, DO .  insulin aspart (novoLOG) injection 0-5 Units, 0-5 Units, Subcutaneous, QHS, Katherine Roan, MD, 2 Units at 02/20/18 2137 .  insulin aspart (novoLOG) injection 0-9 Units, 0-9 Units, Subcutaneous, TID WC, Katherine Roan, MD, 2 Units at 02/22/18 [phone] .  insulin aspart protamine- aspart (NOVOLOG MIX 70/30) injection 30 Units, 30 Units, Subcutaneous, BID WC, Katherine Roan, MD, 30 Units at 02/22/18 [phone] .  levothyroxine (SYNTHROID, LEVOTHROID) tablet 88 mcg, 88 mcg, Oral, QAC breakfast, Jule Ser, DO, 88 mcg at 02/22/18 0836 .  losartan (COZAAR) tablet 100 mg, 100 mg, Oral, Daily, Hoffman, Jessica Ratliff, DO .  senna-docusate (Senokot-S) tablet 1 tablet, 1 tablet, Oral, QHS PRN, Jule Ser, DO  Patients Current Diet: Fall precautions Seizure precautions Diet Carb Modified Fluid consistency: Thin; Room service appropriate? Yes  Precautions / Restrictions Precautions Precautions: Fall Restrictions Weight Bearing Restrictions: No   Has the patient had 2 or more falls or a  fall with injury in the past year?Yes  Prior Activity Level Community (5-7x/wk): Prior to admission patient was fully independent in her home and in the [place] [facility] [place] Devices/Equipment: CBG Meter Home Equipment: None  Prior Device Use: Indicate devices/aids used by the patient prior to current illness, exacerbation or injury? None of the above  Prior Functional Level Prior Function Level of Independence: Independent Comments: history of 4 falls leading to this admission   Self Care:  Did the patient need help bathing, dressing, using the toilet or eating? Independent  Indoor Mobility: Did the patient need assistance with walking from room to room (with or without device)? Independent  Stairs: Did the patient need assistance with internal or external stairs (with or without device)? Independent  Functional Cognition: Did the patient need help planning regular tasks such as shopping or remembering to take medications? Independent  Current Functional Level Cognition  Arousal/Alertness: Awake/alert Overall Cognitive Status: Impaired/Different from baseline Current Attention Level: Selective Orientation Level: Oriented to person, Oriented to place, Oriented to situation, Disoriented to time Following Commands: Follows one step commands with increased time, Follows multi-step commands inconsistently Safety/Judgement: Decreased awareness of safety, Decreased awareness of deficits General Comments: patient with less impulsivity today however continues to have difficulty in following multistep commands  Attention: Selective Selective Attention: Impaired Selective Attention Impairment: Verbal basic, Functional basic Memory: Impaired Memory Impairment: Other (comment)(working memory) Awareness: Impaired Awareness Impairment: Intellectual impairment, Emergent impairment, Anticipatory impairment Problem Solving: Impaired Problem Solving  Impairment: Functional basic, Verbal basic Behaviors: Impulsive Safety/Judgment: Impaired    Extremity Assessment (includes Sensation/Coordination)  Upper Extremity Assessment: Defer to OT evaluation  Lower Extremity Assessment: Defer to PT evaluation RLE Deficits / Details: grossly 4+/5 RLE Sensation: WNL RLE Coordination: decreased gross motor LLE Deficits / Details: grossly 4/5 LLE Sensation: WNL LLE Coordination: decreased gross motor    ADLs  Overall ADL's : Needs assistance/impaired Eating/Feeding: Set up, Sitting, Supervision/ safety Grooming: Wash/dry hands, Wash/dry face, Standing, Min guard Upper Body Bathing: Set up, Supervision/ safety, Sitting Lower Body Bathing: Minimal assistance, Cueing for safety, Sit to/from stand Upper Body Dressing : Set up, Supervision/safety, Sitting Lower Body Dressing: Minimal assistance, Cueing for safety, Sit to/from stand Toilet Transfer: Minimal assistance, Min guard, RW, Ambulation, Grab bars, Comfort height toilet Toileting- Clothing Manipulation and Hygiene: Min guard, Sit to/from stand Scientist, research (medical): Minimal assistance, Ambulation, Grab bars, 3 in 1 Functional mobility during ADLs: Minimal assistance, Min guard, Rolling walker, Cueing for safety, Cueing for sequencing General ADL Comments: pt impulsive and required verbal and tactile cues for correct hand placement during ADL mobility and transfers    Mobility  Overal bed mobility: Needs Assistance Bed Mobility: Supine to Sit, Sit to Supine Supine to sit: Min guard Sit to supine: Min guard General bed mobility comments: min guard for safety     Transfers  Overall transfer level: Needs assistance Equipment used: None Transfers: Sit to/from Stand Sit to Stand: Min guard General transfer comment: Min guard for safety and to assist in steadying     Ambulation / Gait / Stairs / Wheelchair Mobility  Ambulation/Gait Ambulation/Gait assistance: Surveyor, mining (Feet): 200 Feet Assistive device: None Gait Pattern/deviations: Step-through pattern, Drifts right/left, Decreased stride length General Gait Details: unable due to still being connected to EEG machine/RN requests that patient stay in room while on machine  Gait velocity: decreased Gait velocity interpretation: Below normal speed for age/gender    Posture / Balance Balance Overall balance assessment: Needs assistance Sitting-balance support: Feet supported, No upper extremity supported Sitting balance-Leahy Scale: Good Standing balance support: No upper extremity supported, Single extremity supported Standing balance-Leahy Scale: Fair Standing balance comment: mild unsteadiness in standing especially with dynamic tasks     Special needs/care consideration BiPAP/CPAP: No, could not tolerate CPAP CPM: No Continuous Drip IV: No Dialysis: No         Life Vest: No Oxygen: No Special Bed: No Trach Size: No Wound Vac (area): No  Skin: WDL                         Bowel mgmt: Continent, 02/22/18 Bladder mgmt: Continent   Diabetic mgmt: HgbA1c: 8.9 - Yes, with CBG check 2 times daily, and insulin      Previous Home Environment Living Arrangements: Alone  Lives With: Alone Available Help at Discharge: Friend(s), Available PRN/intermittently Type of Home: Apartment Home Layout: One level Home Access: Stairs to enter Entrance Stairs-Rails: Can reach both, Right, Left Entrance Stairs-Number of Steps: 5 Bathroom Shower/Tub: Chiropodist: Standard Home Care Services: No  Discharge Living Setting Plans for Discharge Living Setting: Patient's home, Alone Type of Home at Discharge: Apartment Discharge Home Layout: One level Discharge Home Access: Stairs to enter Entrance Stairs-Rails: Can reach both Entrance Stairs-Number of Steps: 5 Discharge Bathroom Shower/Tub: Tub/shower unit, Curtain Discharge Bathroom Toilet: Standard Discharge Bathroom  Accessibility: Yes How Accessible: Accessible via walker Does the patient have any problems obtaining your medications?: No  Social/Family/Support Systems Patient Roles: Parent Contact Information: Daughter: [name]  Anticipated Caregiver: None, Mod I goals  Ability/Limitations of Caregiver: Daughter works full time  Careers adviser: Intermittent Discharge Plan Discussed with Primary Caregiver: Yes Is Caregiver In Agreement with Plan?: (Patient in agreement with plan ) Does Caregiver/Family have Issues with Lodging/Transportation while Pt is in Rehab?: No  Goals/Additional Needs Patient/Family Goal for Rehab: PT/OT : Mod I  Expected length of stay: 7-9 days  Cultural Considerations: None Dietary Needs: Heart healthy, carb. mod. diet restrictions  Equipment Needs: TBD Pt/Family Agrees to Admission and willing to participate: Yes Program Orientation Provided & Reviewed with Pt/Caregiver Including Roles  & Responsibilities: Yes  Decrease burden of Care through IP rehab admission: No  Possible need for SNF placement upon discharge: Not anticipated   Patient Condition: This patient's medical and functional status has changed since the consult dated: 02/18/18 in which the Rehabilitation Physician determined and documented that the patient's condition is appropriate for intensive rehabilitative care in an inpatient rehabilitation facility. See "History of Present Illness" (above) for medical update. Functional changes are: Min A for static standing and Mod A for dynamic standing with therapy this afternoon (given verbally by OT). Patient's medical and functional status update has been discussed with the Rehabilitation physician and patient remains appropriate for inpatient rehabilitation. Will admit to inpatient rehab today.  Preadmission Screen Completed By:  Gunnar Fusi, 02/22/2018 3:28 PM ______________________________________________________________________   Discussed status with Dr.  Posey Pronto on 02/22/18 at 1530 and received telephone approval for admission today.  Admission Coordinator:  Gunnar Fusi, time 1530/Date 02/22/18

## 2018-02-20 NOTE — Progress Notes (Signed)
Subjective:  Pt alert, reports doing well, felt like she had 2 or 3 more partial seizures with left arm involvement overnight.  Otherwise no headache, nausea or vomiting.    Objective:  Vital signs in last 24 hours: Vitals:   02/19/18 1942 02/19/18 2333 02/20/18 0308 02/20/18 0737  BP: (!) 180/74 (!) 175/72 (!) 167/73 (!) 168/69  Pulse: 70 72 85 70  Resp: 18 18 18 18   Temp: 98.2 F (36.8 C) 98.1 F (36.7 C) 98 F (36.7 C) 97.7 F (36.5 C)  TempSrc: Oral Oral Oral Oral  SpO2: 98% 99% 100% 98%  Weight:      Height:       Physical Exam  Constitutional: She is well-developed, well-nourished, and in no distress.  Cardiovascular: Normal rate and regular rhythm. Exam reveals no gallop and no friction rub.  Murmur (early systolic murmur 2/6) heard. Pulmonary/Chest: Effort normal and breath sounds normal. No respiratory distress. She has no wheezes. She has no rales.  Abdominal: Soft. She exhibits no distension.  Musculoskeletal: She exhibits no edema.  Neurological:  Left sided weakness with improvement, facial droop of left face improved  Skin: Skin is warm and dry.     Assessment/Plan:  Active Problems:   Hypothyroidism   Diabetes mellitus with stage 3 chronic kidney disease (HCC)   Hyperlipidemia   HYPERTENSION, BENIGN SYSTEMIC   PVD (peripheral vascular disease) (HCC)   CKD (chronic kidney disease), stage III (HCC)   UTI (urinary tract infection)   CVA (cerebral vascular accident) ([place])   Seizure ([place])  CVA Patient had an MRI head showing subcentimeter acute/early subacute infarction in the right mid corona radiata with a few adjacent punctate foci. No hemorrhage or mass effect noted.  MRA head with no large vessel occlusion or large aneurysm identified.  Neurology consulted and completing stroke workup.   -Allow for permissive hypertension for the first 24-48h - only treat PRN if SBP >220 mmHg -Carotid dopplers with minimal plaque no CEA indicated -Echocardiogram  shows no visible PFO, normal EF, NRWMA, G1DD -Frequent neuro checks -continue with Plavix 75 mg PO daily -statin intolerant started on LOPID 600mg  BID -PT/OT evaluated pt, recommended CIR----rehab consulted and waiting for stabilization of seizure activity   Seizure No prior history of seizure, continued to have multiple seizures, after stroke, neurology following  - IV keppra 750mg  BID increased to 1000mg  BID with improvement in renal function -dilantin added but pt did not tolerate well began to itch - depakote loaded instead was placed on 500mg  BID -was stable for CIR unfortunately had another seizure event witnessed by neurology -MRI brain ordered, depakote increased to 500mg  Q8h, Vimpat 100mg  BID added and 200mg  load given -Pt has not went down for MRI study yet  Urinary retention resolved  UTI resolved   Hypertension Currently normotensive. Holding home blood pressure medications allowing for permissive hypertension  -will continue permissive htn pending results of repeat MRI  T2DM HgbA1C 8.9  -pt blood sugars were doing well, had applesauce, juice and gram crackers last night now higher this morning -CBG TID with meals, qHS -70/30 26 units BID WC -will consider adding SSI thin TID AC   CKD Stage 3: Cr at baseline of 1.4-1.6.   -Continue to monitor while inpatient.   VTE Prophylaxis: Heparin TID Code Status: Full   Dispo: Anticipated discharge to inpatient rehab in 1-2 days pending stability of seizure activity  Katherine Roan, MD 02/20/2018, 8:10 AM Vickki Muff MD PGY-1 Internal Medicine Pager #  [phone]  

## 2018-02-20 NOTE — Progress Notes (Signed)
  Speech Language Pathology Treatment: Cognitive-Linquistic  Patient Details Name: [name] MRN: [id number] DOB: 14-Dec-1950 Today's Date: 02/20/2018 Time: 1152-1203 SLP Time Calculation (min) (ACUTE ONLY): 11 min  Assessment / Plan / Recommendation Clinical Impression  Pt lethargic; notes indicate pt self reported several seizures last night; unwitnessed.  For repeat MRI today per pt report.  Oriented to person, year/month, location; not oriented to date or DOW.  Max cues needed to maintain wakefulness for participation, max assist for response time, initiation given lethargy.  Session ended early given MS.  SLP to follow.    HPI HPI: 67 y.o. female admitted for HA, left sided weakness and slurred speech. Brain MR revealed acute to subacute right corona radiata infarct with a few adjacent punctate foci. PMH includes but not limited to hx of R genu CVA, DM, HTN, PVD, HLD, and GERD.      SLP Plan  Continue with current plan of care       Recommendations                   Follow up Recommendations: Inpatient Rehab;24 hour supervision/assistance SLP Visit Diagnosis: Cognitive communication deficit (M25.003) Plan: Continue with current plan of care       GO                [name] 02/20/2018, 12:04 PM

## 2018-02-20 NOTE — Progress Notes (Signed)
Inpatient Diabetes Program Recommendations  AACE/ADA: New Consensus Statement on Inpatient Glycemic Control (2015)  Target Ranges:  Prepandial:   less than 140 mg/dL      Peak postprandial:   less than 180 mg/dL (1-2 hours)      Critically ill patients:  140 - 180 mg/dL   Lab Results  Component Value Date   GLUCAP 286 (H) 02/20/2018   HGBA1C 8.9 (H) 02/16/2018    Review of Glycemic ControlResults for ANNAYA, BANGERT (MRN [id number]) as of 02/20/2018 09:56  Ref. Range 02/19/2018 06:33 02/19/2018 11:26 02/19/2018 16:42 02/19/2018 21:02 02/20/2018 06:24  Glucose-Capillary Latest Ref Range: 65 - 99 mg/dL 178 (H) 155 (H) 292 (H) 269 (H) 286 (H)    Diabetes history: Type 2 DM Outpatient Diabetes medications: Novolin 70/30 36 units daily, Liraglutide 1.2 mg daily Current orders for Inpatient glycemic control:  70/30 mix 26 units bid  Inpatient Diabetes Program Recommendations:   Please consider increasing 70/30 mix to 30 units bid.  Also consider adding Novolog sensitive correction while in the hospital.   Thanks,  Adah Perl, RN, BC-ADM Inpatient Diabetes Coordinator Pager [phone] (8a-5p)

## 2018-02-20 NOTE — Progress Notes (Signed)
Internal Medicine Attending:   I saw and examined the patient. I reviewed the resident's note and I agree with the resident's findings and plan as documented in the resident's note.  Patient feels well today but still has some intermittent episodes of possible partial seizures in her left arm overnight.  Patient was initially admitted for worsening left-sided weakness and was found to have an acute CVA and had her hospital course complicated by recurrent seizure like activity.  Neuro follow-up and recommendations appreciated.  CVA workup has been completed.  Will continue with Plavix for now.  PT/OT recommending CIR.  Patient was started on IV Keppra for her seizures and had Depakote as well as Vimpat added to her regimen given her persistent episodes of partial seizures.  She had another MRI of her brain ordered given her persistent partial seizures by neurology.  We will follow-up results.  Patient awaiting discharge to CIR once neuro workup has been completed.  No further workup at this time.

## 2018-02-20 NOTE — Progress Notes (Signed)
LTM running - no initial skin breakdown. 

## 2018-02-20 NOTE — Progress Notes (Signed)
Inpatient Rehabilitation  Note that acute medical work up is still on going at this time.  Updated insurance and will continue to follow for timing of readiness.  Call if questions.   Carmelia Roller., CCC/SLP Admission Coordinator  [place]  Cell [phone]

## 2018-02-20 NOTE — Progress Notes (Signed)
Pt stated she has had about 4 seizures but no one has witness and she did not call. Only told when we entered the room. Pt slept well throughout the night.

## 2018-02-20 NOTE — Care Management Important Message (Signed)
Important Message  Patient Details  Name: [name] MRN: [id number] Date of Birth: 12/04/1950   Medicare Important Message Given:  Yes    Amran Malter 02/20/2018, 1:51 PM

## 2018-02-21 DIAGNOSIS — E039 Hypothyroidism, unspecified: Secondary | ICD-10-CM

## 2018-02-21 DIAGNOSIS — E1151 Type 2 diabetes mellitus with diabetic peripheral angiopathy without gangrene: Secondary | ICD-10-CM

## 2018-02-21 DIAGNOSIS — R471 Dysarthria and anarthria: Secondary | ICD-10-CM

## 2018-02-21 LAB — BASIC METABOLIC PANEL
ANION GAP: 11 (ref 5–15)
BUN: 15 mg/dL (ref 6–20)
CALCIUM: 9.6 mg/dL (ref 8.9–10.3)
CO2: 24 mmol/L (ref 22–32)
Chloride: 105 mmol/L (ref 101–111)
Creatinine, Ser: 1.27 mg/dL — ABNORMAL HIGH (ref 0.44–1.00)
GFR calc Af Amer: 50 mL/min — ABNORMAL LOW (ref >60)
GFR calc non Af Amer: 43 mL/min — ABNORMAL LOW (ref >60)
GLUCOSE: 203 mg/dL — AB (ref 65–99)
POTASSIUM: 4.2 mmol/L (ref 3.5–5.1)
Sodium: 140 mmol/L (ref 135–145)

## 2018-02-21 LAB — GLUCOSE, CAPILLARY
GLUCOSE-CAPILLARY: 188 mg/dL — AB (ref 65–99)
Glucose-Capillary: 201 mg/dL — ABNORMAL HIGH (ref 65–99)
Glucose-Capillary: 249 mg/dL — ABNORMAL HIGH (ref 65–99)
Glucose-Capillary: 169 mg/dL — ABNORMAL HIGH (ref 65–99)

## 2018-02-21 LAB — CBC
HEMATOCRIT: 32.8 % — AB (ref 36.0–46.0)
Hemoglobin: 10.8 g/dL — ABNORMAL LOW (ref 12.0–15.0)
MCH: 29 pg (ref 26.0–34.0)
MCHC: 32.9 g/dL (ref 30.0–36.0)
MCV: 88.2 fL (ref 78.0–100.0)
Platelets: 284 10*3/uL (ref 150–400)
RBC: 3.72 MIL/uL — ABNORMAL LOW (ref 3.87–5.11)
RDW: 14.6 % (ref 11.5–15.5)
WBC: 5.9 10*3/uL (ref 4.0–10.5)

## 2018-02-21 LAB — VALPROIC ACID LEVEL: Valproic Acid Lvl: 59 ug/mL (ref 50.0–100.0)

## 2018-02-21 MED ORDER — DIVALPROEX SODIUM 250 MG PO DR TAB
750.0000 mg | DELAYED_RELEASE_TABLET | Freq: Three times a day (TID) | ORAL | Status: DC
  Administered 2018-02-21 – 2018-02-22 (×3): 750 mg via ORAL
  Filled 2018-02-21 (×3): qty 3

## 2018-02-21 NOTE — Progress Notes (Signed)
LTM EEG checked, no skin breakdown noted. Pt voices no complaints or concerns.

## 2018-02-21 NOTE — Progress Notes (Signed)
STROKE TEAM PROGRESS NOTE   SUBJECTIVE (INTERVAL HISTORY) No family at bedside. Pt is on LTM EEG. Fixed broken leads with EEG tech. Pt stated that she had 8 seizures overnight. But preliminary read of the EEG did not show EEG correlation. However, pending official reports. Gave pt event button to push if she felt it coming on.    OBJECTIVE Temp:  [98 F (36.7 C)-99.2 F (37.3 C)] 98.2 F (36.8 C) (04/11 1221) Pulse Rate:  [71-91] 90 (04/11 1221) Cardiac Rhythm: Normal sinus rhythm (04/11 0700) Resp:  [20] 20 (04/11 1221) BP: (140-169)/(58-91) 169/91 (04/11 1221) SpO2:  [95 %-100 %] 100 % (04/11 1221)  CBC:  Recent Labs  Lab 02/15/18 2328  02/20/18 0538 02/21/18 0626  WBC 7.0   < > 5.8 5.9  NEUTROABS 4.4  --   --   --   HGB 10.9*   < > 10.1* 10.8*  HCT 33.8*   < > 30.4* 32.8*  MCV 89.2   < > 86.9 88.2  PLT 275   < > 248 284   < > = values in this interval not displayed.    Basic Metabolic Panel:  Recent Labs  Lab 02/18/18 0245  02/20/18 0538 02/21/18 0626  NA 138   < > 139 140  K 3.9   < > 4.4 4.2  CL 105   < > 103 105  CO2 21*   < > 23 24  GLUCOSE 153*   < > 281* 203*  BUN 18   < > 15 15  CREATININE 1.35*   < > 1.33* 1.27*  CALCIUM 9.1   < > 9.4 9.6  MG 2.1  --   --   --    < > = values in this interval not displayed.    Lipid Panel:     Component Value Date/Time   CHOL 168 02/16/2018 0200   CHOL 200 (H) 10/18/2016 1518   TRIG 255 (H) 02/16/2018 0200   HDL 30 (L) 02/16/2018 0200   HDL 34 (L) 10/18/2016 1518   CHOLHDL 5.6 02/16/2018 0200   VLDL 51 (H) 02/16/2018 0200   LDLCALC 87 02/16/2018 0200   LDLCALC 99 10/18/2016 1518   HgbA1c:  Lab Results  Component Value Date   HGBA1C 8.9 (H) 02/16/2018   Urine Drug Screen:     Component Value Date/Time   LABOPIA NONE DETECTED 02/16/2018 1201   COCAINSCRNUR NONE DETECTED 02/16/2018 1201   LABBENZ NONE DETECTED 02/16/2018 1201   AMPHETMU NONE DETECTED 02/16/2018 1201   THCU NONE DETECTED 02/16/2018  1201   LABBARB NONE DETECTED 02/16/2018 1201    Alcohol Level     Component Value Date/Time   ETH <10 02/15/2018 2328    IMAGING I have personally reviewed the radiological images below and agree with the radiology interpretations.  Ct Head Wo Contrast 02/16/2018 IMPRESSION:  Suggestion of acute infarct in the right periventricular and insular region. Consider MRI correlation if clinically indicated. No acute intracranial hemorrhage or mass effect.   Mr Brain Wo Contrast 02/16/2018 MRI head:  1. Subcentimeter acute/early subacute infarction and right mid corona radiata with few adjacent punctate foci. No associated hemorrhage or mass effect.  2. Mild chronic microvascular ischemic changes and mild parenchymal volume loss of the brain.  3. Small chronic infarcts in right genu of corpus callosum, right body of corpus callosum, and right hemi pons.  MRA head:  Severe motion artifact. Suboptimal assessment for stenosis or small aneurysm. No large vessel  occlusion or large aneurysm identified.   Transthoracic Echocardiogram - Left ventricle: The cavity size was normal. Wall thickness was   normal. Systolic function was normal. The estimated ejection   fraction was in the range of 60% to 65%. Wall motion was normal;   there were no regional wall motion abnormalities. Doppler   parameters are consistent with abnormal left ventricular   relaxation (grade 1 diastolic dysfunction).  Bilateral Carotid Dopplers  02/16/2018 1-39% ICA plaquing. Vertebral artery flow is antegrade.   EEG This normal EEG is recorded in the waking and sleep state. There was no seizure or seizure predisposition recorded on this study. Please note that a normal EEG does not preclude the possibility of epilepsy.   LTM EEG pending  MRI with and without contrast pending   PHYSICAL EXAM Temp:  [98 F (36.7 C)-99.2 F (37.3 C)] 98.2 F (36.8 C) (04/11 1221) Pulse Rate:  [71-91] 90 (04/11 1221) Resp:  [20] 20  (04/11 1221) BP: (140-169)/(58-91) 169/91 (04/11 1221) SpO2:  [95 %-100 %] 100 % (04/11 1221)  General - Well nourished, well developed, in no apparent distress.  Ophthalmologic - Fundi not visualized due to small pupils.  Cardiovascular - Regular rate and rhythm.  Mental Status -  Level of arousal and orientation to time, place, and person were intact. Language including expression, naming, repetition, comprehension was assessed and found intact. Fund of Knowledge was assessed and was intact.  Cranial Nerves II - XII - II - Visual field intact OU. III, IV, VI - Extraocular movements intact. V - Facial sensation intact bilaterally. VII - left nasolabial fold mild flattening. VIII - Hearing & vestibular intact bilaterally. X - Palate elevates symmetrically. XI - Chin turning & shoulder shrug intact bilaterally. XII - Tongue protrusion intact.  Motor Strength - The patient's strength was normal in all limbs except 4/5 LUE proximal and distally with dexterity difficulty.  Bulk was normal and fasciculations were absent.   Motor Tone - Muscle tone was assessed at the neck and appendages and was normal.  Reflexes - The patient's reflexes were 1+ in all extremities and she had no pathological reflexes.  Sensory - Light touch, temperature/pinprick were assessed and were symmetrical.    Coordination - The patient had ataxia on the left arm with FTN testing.  Tremor was absent.  Gait and Station - deferred.   ASSESSMENT/PLAN [name] is a 67 y.o. female with history of peripheral vascular disease, stroke x2 affecting the left side with no residual deficits, hypertension, diabetes, hyperlipidemia presenting with left-sided weakness, simple partial seizures, and a new headache. She did not receive IV t-PA due to late presentation.  Stroke:  subacute right mid corona radiata infarct, likely due to small vessel disease.  Resultant   Mild left facial droop and left arm  incoordination   CT head - Suggestion of acute infarct in the right periventricular and insular region.  MRI head - Subcentimeter acute/early subacute infarction at right mid corona radiata with few adjacent punctate foci with multiple old infarcts.  MRA head - motion degraded but otherwise unremarkable  UDS - negative  Carotid Doppler - 1-39% ICA plaquing. Vertebral artery flow is antegrade.   2D Echo - EF 60-65%  LDL - 87  HgbA1c - 8.9  VTE prophylaxis - [place] heparin Fall precautions Seizure precautions Diet Carb Modified Fluid consistency: Thin; Room service appropriate? Yes  clopidogrel 75 mg daily and Pletal prior to admission, now on clopidogrel 75 mg daily and  Pletal. Continue this DAPT on discharge.   Patient will be counseled to be compliant with her antithrombotic medications  Ongoing aggressive stroke risk factor management  Therapy recommendations: CIR  Disposition:  Pending  Simple partial seizure  Episodes of left eye gaze, left head turning, left arm tonic flexion at home  More episodes of left arm shaking with todd's paralysis during hospitalization   No LOC, able to converse well during episodes  Still complains of ongoing seizure overnight last night  On keppra, increase to 1000mg  bid  On depakote, increase to 500mg  tid  Continue Vimpat 100mg  bid  depakote level 59 -> 43->56->59, increase Depakote to 750 tid.    Depakote level in am  Spot EEG normal  LTM EEG ongoing, report pending  Hx of stroke  First one in 2004 and on plavix  01/2011 right CC stroke with left sided weakness, balance issue and slurry speech - residue of walking with hopping on the left side - on plavix and platel  Hypertension  Stable  Long-term BP goal normotensive  Hyperlipidemia  Lipid lowering medication PTA: lopid,  Intolerant to statins  LDL 87, goal < 70  Current lipid lowering medication: lopid  Continue at discharge  Diabetes  HgbA1c 8.9, goal  < 7.0  Uncontrolled  Off medication PTA  hyperglycemia  SSI  CBG monitoring  AKI, slowly improving  Cre 1.50->1.46->1.71->1.35->1.36->1.33->1.27  Encourage po intake  Close monitoring  Other Stroke Risk Factors  Advanced age  Former cigarette smoker - quit 7 years ago.  Obesity, Body mass index is 30.65 kg/m., recommend weight loss, diet and exercise as appropriate   PVD - on plavix and platel  Other Active Problems  Grave's disease s/p iodine ablation - on supplement - TSH  Low but FT4 normal  Hospital day # 5    [name], MD PhD Stroke Neurology 02/21/2018 1:16 PM  To contact Stroke Continuity provider, please refer to [email or web address] After hours, contact General Neurology

## 2018-02-21 NOTE — Progress Notes (Signed)
OT Cancellation Note  Patient Details Name: [name] MRN: [id number] DOB: Sep 01, 1951   Cancelled Treatment:    Reason Eval/Treat Not Completed: Patient at procedure or test/ unavailable. Pt on EEG  Britt Bottom 02/21/2018, 10:53 AM

## 2018-02-21 NOTE — Progress Notes (Signed)
Patient will be discharged on Vimpat, CSW provided 30 day free card to patient and explained benefit. Card left at bedside. Patient said she would relay information to her daughter.  Laveda Abbe, [place] Clinical Social Worker [phone]

## 2018-02-21 NOTE — Progress Notes (Signed)
Medicine attending: I examined this patient today together with resident physician Dr. Guadlupe Spanish and I concur with his evaluation and management plan which we discussed together. 67 year old woman with hypertension, type 2 diabetes, stage III chronic renal insufficiency, hyperlipidemia, hypothyroidism, and peripheral vascular disease who presented on February 15, 2018 with left-sided weakness and dysarthria and was found to have acute versus subacute infarction in the right mid corona radiata/right periventricular and insular regions area.  Stroke evaluation so far shows no major vessel occlusion, minimal plaques in the carotid arteries on ultrasound, no obvious valvular or structural cardiac defects.  Currently being treated with Plavix and Lopid. 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  Neurology involved and titrating anticonvulsants.  Currently on Depakote, Vimpat, and Keppra.  Initial EEG did not show any definite seizure focus.  She is currently having a repeat 24-hour EEG with continuous video monitoring.  A follow-up MRI scheduled for today. No focal neurologic deficits on exam today.  Dysarthria has resolved.  Motor strength appears normal upper and lower extremities.  No gross cranial nerve abnormalities.  Reflexes absent symmetric at the knees, 1+ symmetric at the biceps.  During testing of her left upper extremity she developed a minor Jacksonian seizure with spasmodic movements of her hands and arm which resolved spontaneously. We will continue current management plan as outlined above.

## 2018-02-21 NOTE — Procedures (Signed)
  Electroencephalogram report- LTM   Data acquisition: 10-20 electrode placement.  Additional T1, T2, and EKG electrodes; 26 channel digital referential acquisition reformatted to 18 channel/7 channel coronal bipolar     Spike detection: ON     Seizure detection: ON   Beginning time: 02/20/18 at 14 34  Ending time: 02/21/18 at 08 34 am  CPT: 95951 Day of study: day 1    This 16 hours of intensive EEG monitoring with simultaneous video monitoring was performed for this patient with spells left-sided weakness as a part of ongoing series to capture events of interest and determine if these are seizures.    Medications as per EMR  There was no pushbutton activations events during this recording.  Automated spike detection program did not detect any spikes. Seizure detection program did not detect any seizures .   Waking background activities were marked by 8 cps posterior dominant developed on the as opposed to right.  There is a persistent right hemispheric delta slowing involving the right frontotemporal region throughout the recording.  There is no interictal epileptiform discharges.  No clinical or subclinical seizures present.  Clinical interpretation: This day 1 of intensive EEG monitoring with simultaneous video monitoring did not record any clinical subclinical seizures.  EEG was abnormal due to persistent right frontotemporal delta slowing suggestive of neuronal dysfunction on a structural vascular or degenerative basis.  Clinical correlation is advised.

## 2018-02-21 NOTE — Progress Notes (Signed)
Subjective:  Pt alert, slept poorly last night, had witnessed partial seizure during our exam today.      Objective:  Vital signs in last 24 hours: Vitals:   02/20/18 2024 02/21/18 0011 02/21/18 0400 02/21/18 0817  BP: (!) 165/72 (!) 144/70 (!) 140/58 (!) 160/83  Pulse: 75 85 71 91  Resp: 20 20 20 20   Temp: 98 F (36.7 C) 99 F (37.2 C) 98.6 F (37 C) 99.2 F (37.3 C)  TempSrc: Axillary Oral Oral Oral  SpO2: 98% 99% 95% 100%  Weight:      Height:       Physical Exam  Constitutional: She is well-developed, well-nourished, and in no distress.  Cardiovascular: Normal rate and regular rhythm. Exam reveals no gallop and no friction rub.  Murmur (early systolic murmur 2/6) heard. Pulmonary/Chest: Effort normal and breath sounds normal. No respiratory distress. She has no wheezes. She has no rales.  Abdominal: Soft. She exhibits no distension.  Musculoskeletal: She exhibits no edema.  Neurological:  Left sided weakness minimal UE>LE, fine motor function of hand limited compared to right,  facial droop of left face improved.  Short witnessed partial seizure with associated left arm tremor and contraction.    Skin: Skin is warm and dry.    BMP Latest Ref Rng & Units 02/21/2018 02/20/2018 02/19/2018  Glucose 65 - 99 mg/dL 203(H) 281(H) 204(H)  BUN 6 - 20 mg/dL 15 15 15   Creatinine 0.44 - 1.00 mg/dL 1.27(H) 1.33(H) 1.36(H)  BUN/Creat Ratio 12 - 28 - - -  Sodium 135 - 145 mmol/L 140 139 140  Potassium 3.5 - 5.1 mmol/L 4.2 4.4 4.0  Chloride 101 - 111 mmol/L 105 103 105  CO2 22 - 32 mmol/L 24 23 24   Calcium 8.9 - 10.3 mg/dL 9.6 9.4 9.4    Assessment/Plan:  Active Problems:   Hypothyroidism   Diabetes mellitus with stage 3 chronic kidney disease (HCC)   Hyperlipidemia   HYPERTENSION, BENIGN SYSTEMIC   PVD (peripheral vascular disease) (HCC)   CKD (chronic kidney disease), stage III (HCC)   UTI (urinary tract infection)   CVA (cerebral vascular accident) ([place])   Seizure  ([place])  CVA Patient had an MRI head showing subcentimeter acute/early subacute infarction in the right mid corona radiata with a few adjacent punctate foci. No hemorrhage or mass effect noted.  MRA head with no large vessel occlusion or large aneurysm identified.  Neurology consulted and completing stroke workup.   -Allow for permissive hypertension for the first 24-48h - only treat PRN if SBP >220 mmHg -Carotid dopplers with minimal plaque no CEA indicated -Echocardiogram shows no visible PFO, normal EF, NRWMA, G1DD -Frequent neuro checks -continue with Plavix 75 mg PO daily -statin intolerant started on LOPID 600mg  BID -PT/OT evaluated pt, recommended CIR----rehab consulted and waiting for stabilization of seizure activity   Seizure No prior history of seizure, continued to have multiple seizures after stroke, neurology following  -Neurology continuing to titrate AED therapy -Overnight EEG with video recording placed, pt reports multiple seizure episodes throughout the night -Pt has not went down for MRI study yet  Urinary retention resolved  UTI resolved   Hypertension Currently normotensive. Holding home blood pressure medications allowing for permissive hypertension  -will continue permissive htn pending results of repeat MRI  T2DM HgbA1C 8.9  -pt blood sugars were doing well, appetite continuing to increase, adjusting insulin appropriately -CBG TID with meals, qHS -70/30 increased to 28 units BID WC -added SSI thin TID AC  CKD Stage 3: Cr at baseline of 1.4-1.6.   -Continue to monitor while inpatient.   VTE Prophylaxis: Heparin TID Code Status: Full   Dispo: Anticipated discharge to inpatient rehab in 1-2 days pending stability of seizure activity  Katherine Roan, MD 02/21/2018, 10:41 AM Vickki Muff MD PGY-1 Internal Medicine Pager # [phone]

## 2018-02-21 NOTE — Progress Notes (Signed)
PT Cancellation Note  Patient Details Name: [name] MRN: [id number] DOB: 1951-06-06   Cancelled Treatment:    Reason Eval/Treat Not Completed: Patient at procedure or test/unavailablept with EEG on, will re-attempt tomorrow.   Reinaldo Berber, PT, DPT Acute Rehab Services Pager: [phone]     Reinaldo Berber 02/21/2018, 5:28 PM

## 2018-02-21 NOTE — Progress Notes (Signed)
Inpatient Rehabilitation  Continuing to follow along for timing of medical readiness, discussed case with Dr. Erlinda Hong  Will plan to follow up again tomorrow to determine if long-term EEG is complete.  Carmelia Roller., CCC/SLP Admission Coordinator  [place]  Cell [phone]

## 2018-02-22 ENCOUNTER — Inpatient Hospital Stay (HOSPITAL_COMMUNITY)
Admission: RE | Admit: 2018-02-22 | Discharge: 2018-03-05 | DRG: 092 | Disposition: A | Discharge status: Home Health | Payer: Medicare HMO | Source: Intra-hospital | Attending: Physical Medicine & Rehabilitation | Admitting: Physical Medicine & Rehabilitation

## 2018-02-22 ENCOUNTER — Ambulatory Visit: Payer: Medicaid Other | Admitting: Dietician

## 2018-02-22 ENCOUNTER — Encounter (HOSPITAL_COMMUNITY): Payer: Self-pay

## 2018-02-22 DIAGNOSIS — F05 Delirium due to known physiological condition: Secondary | ICD-10-CM | POA: Diagnosis not present

## 2018-02-22 DIAGNOSIS — I129 Hypertensive chronic kidney disease with stage 1 through stage 4 chronic kidney disease, or unspecified chronic kidney disease: Secondary | ICD-10-CM | POA: Diagnosis present

## 2018-02-22 DIAGNOSIS — Z87891 Personal history of nicotine dependence: Secondary | ICD-10-CM | POA: Diagnosis not present

## 2018-02-22 DIAGNOSIS — I63411 Cerebral infarction due to embolism of right middle cerebral artery: Secondary | ICD-10-CM | POA: Diagnosis present

## 2018-02-22 DIAGNOSIS — Z7902 Long term (current) use of antithrombotics/antiplatelets: Secondary | ICD-10-CM | POA: Diagnosis not present

## 2018-02-22 DIAGNOSIS — M62838 Other muscle spasm: Secondary | ICD-10-CM | POA: Diagnosis present

## 2018-02-22 DIAGNOSIS — R41841 Cognitive communication deficit: Secondary | ICD-10-CM | POA: Diagnosis present

## 2018-02-22 DIAGNOSIS — E038 Other specified hypothyroidism: Secondary | ICD-10-CM

## 2018-02-22 DIAGNOSIS — R2689 Other abnormalities of gait and mobility: Secondary | ICD-10-CM | POA: Diagnosis not present

## 2018-02-22 DIAGNOSIS — I2510 Atherosclerotic heart disease of native coronary artery without angina pectoris: Secondary | ICD-10-CM | POA: Diagnosis present

## 2018-02-22 DIAGNOSIS — E1165 Type 2 diabetes mellitus with hyperglycemia: Secondary | ICD-10-CM

## 2018-02-22 DIAGNOSIS — E1151 Type 2 diabetes mellitus with diabetic peripheral angiopathy without gangrene: Secondary | ICD-10-CM | POA: Diagnosis not present

## 2018-02-22 DIAGNOSIS — I69354 Hemiplegia and hemiparesis following cerebral infarction affecting left non-dominant side: Secondary | ICD-10-CM | POA: Diagnosis not present

## 2018-02-22 DIAGNOSIS — E559 Vitamin D deficiency, unspecified: Secondary | ICD-10-CM | POA: Diagnosis present

## 2018-02-22 DIAGNOSIS — I69393 Ataxia following cerebral infarction: Secondary | ICD-10-CM | POA: Diagnosis not present

## 2018-02-22 DIAGNOSIS — D62 Acute posthemorrhagic anemia: Secondary | ICD-10-CM

## 2018-02-22 DIAGNOSIS — Z794 Long term (current) use of insulin: Secondary | ICD-10-CM

## 2018-02-22 DIAGNOSIS — R4587 Impulsiveness: Secondary | ICD-10-CM | POA: Diagnosis present

## 2018-02-22 DIAGNOSIS — E1122 Type 2 diabetes mellitus with diabetic chronic kidney disease: Secondary | ICD-10-CM

## 2018-02-22 DIAGNOSIS — G4700 Insomnia, unspecified: Secondary | ICD-10-CM | POA: Diagnosis present

## 2018-02-22 DIAGNOSIS — G441 Vascular headache, not elsewhere classified: Secondary | ICD-10-CM | POA: Diagnosis not present

## 2018-02-22 DIAGNOSIS — Z79899 Other long term (current) drug therapy: Secondary | ICD-10-CM

## 2018-02-22 DIAGNOSIS — I10 Essential (primary) hypertension: Secondary | ICD-10-CM

## 2018-02-22 DIAGNOSIS — I639 Cerebral infarction, unspecified: Secondary | ICD-10-CM | POA: Diagnosis not present

## 2018-02-22 DIAGNOSIS — G8114 Spastic hemiplegia affecting left nondominant side: Secondary | ICD-10-CM

## 2018-02-22 DIAGNOSIS — I69392 Facial weakness following cerebral infarction: Secondary | ICD-10-CM | POA: Diagnosis not present

## 2018-02-22 DIAGNOSIS — N183 Chronic kidney disease, stage 3 unspecified: Secondary | ICD-10-CM

## 2018-02-22 DIAGNOSIS — R5383 Other fatigue: Secondary | ICD-10-CM

## 2018-02-22 DIAGNOSIS — K219 Gastro-esophageal reflux disease without esophagitis: Secondary | ICD-10-CM | POA: Diagnosis present

## 2018-02-22 DIAGNOSIS — Z833 Family history of diabetes mellitus: Secondary | ICD-10-CM

## 2018-02-22 DIAGNOSIS — E039 Hypothyroidism, unspecified: Secondary | ICD-10-CM | POA: Diagnosis present

## 2018-02-22 DIAGNOSIS — Z9181 History of falling: Secondary | ICD-10-CM

## 2018-02-22 DIAGNOSIS — E785 Hyperlipidemia, unspecified: Secondary | ICD-10-CM | POA: Diagnosis not present

## 2018-02-22 DIAGNOSIS — Z79890 Hormone replacement therapy (postmenopausal): Secondary | ICD-10-CM | POA: Diagnosis not present

## 2018-02-22 DIAGNOSIS — N179 Acute kidney failure, unspecified: Secondary | ICD-10-CM | POA: Diagnosis not present

## 2018-02-22 DIAGNOSIS — Z888 Allergy status to other drugs, medicaments and biological substances status: Secondary | ICD-10-CM

## 2018-02-22 DIAGNOSIS — R0989 Other specified symptoms and signs involving the circulatory and respiratory systems: Secondary | ICD-10-CM | POA: Diagnosis not present

## 2018-02-22 DIAGNOSIS — I63311 Cerebral infarction due to thrombosis of right middle cerebral artery: Secondary | ICD-10-CM

## 2018-02-22 DIAGNOSIS — I69322 Dysarthria following cerebral infarction: Secondary | ICD-10-CM

## 2018-02-22 DIAGNOSIS — N289 Disorder of kidney and ureter, unspecified: Secondary | ICD-10-CM | POA: Diagnosis not present

## 2018-02-22 DIAGNOSIS — R410 Disorientation, unspecified: Secondary | ICD-10-CM | POA: Diagnosis not present

## 2018-02-22 DIAGNOSIS — I6389 Other cerebral infarction: Secondary | ICD-10-CM | POA: Diagnosis not present

## 2018-02-22 LAB — CBC
HCT: 31.7 % — ABNORMAL LOW (ref 36.0–46.0)
Hemoglobin: 10.4 g/dL — ABNORMAL LOW (ref 12.0–15.0)
MCH: 28.8 pg (ref 26.0–34.0)
MCHC: 32.8 g/dL (ref 30.0–36.0)
MCV: 87.8 fL (ref 78.0–100.0)
PLATELETS: 290 10*3/uL (ref 150–400)
RBC: 3.61 MIL/uL — AB (ref 3.87–5.11)
RDW: 14.7 % (ref 11.5–15.5)
WBC: 5.1 10*3/uL (ref 4.0–10.5)

## 2018-02-22 LAB — BASIC METABOLIC PANEL
ANION GAP: 11 (ref 5–15)
Anion gap: 14 (ref 5–15)
BUN: 18 mg/dL (ref 6–20)
BUN: 22 mg/dL — AB (ref 6–20)
CHLORIDE: 104 mmol/L (ref 101–111)
CO2: 23 mmol/L (ref 22–32)
CO2: 20 mmol/L — AB (ref 22–32)
CREATININE: 1.43 mg/dL — AB (ref 0.44–1.00)
Calcium: 9.6 mg/dL (ref 8.9–10.3)
Calcium: 9.4 mg/dL (ref 8.9–10.3)
Chloride: 107 mmol/L (ref 101–111)
Creatinine, Ser: 1.33 mg/dL — ABNORMAL HIGH (ref 0.44–1.00)
GFR calc Af Amer: 47 mL/min — ABNORMAL LOW (ref >60)
GFR calc Af Amer: 43 mL/min — ABNORMAL LOW (ref >60)
GFR calc non Af Amer: 38 mL/min — ABNORMAL LOW (ref >60)
GFR, EST NON AFRICAN AMERICAN: 41 mL/min — AB (ref >60)
GLUCOSE: 227 mg/dL — AB (ref 65–99)
Glucose, Bld: 164 mg/dL — ABNORMAL HIGH (ref 65–99)
POTASSIUM: 4 mmol/L (ref 3.5–5.1)
Potassium: 3.7 mmol/L (ref 3.5–5.1)
SODIUM: 141 mmol/L (ref 135–145)
SODIUM: 138 mmol/L (ref 135–145)

## 2018-02-22 LAB — GLUCOSE, CAPILLARY
GLUCOSE-CAPILLARY: 101 mg/dL — AB (ref 65–99)
GLUCOSE-CAPILLARY: 204 mg/dL — AB (ref 65–99)
Glucose-Capillary: 162 mg/dL — ABNORMAL HIGH (ref 65–99)
Glucose-Capillary: 271 mg/dL — ABNORMAL HIGH (ref 65–99)

## 2018-02-22 LAB — VALPROIC ACID LEVEL: Valproic Acid Lvl: 60 ug/mL (ref 50.0–100.0)

## 2018-02-22 MED ORDER — PROCHLORPERAZINE MALEATE 5 MG PO TABS: 5.0000 mg | ORAL_TABLET | Freq: Four times a day (QID) | ORAL | Status: DC | PRN

## 2018-02-22 MED ORDER — BACLOFEN 10 MG PO TABS
10.0000 mg | ORAL_TABLET | Freq: Three times a day (TID) | ORAL | Status: DC
  Administered 2018-02-22: 10 mg via ORAL
  Filled 2018-02-22: qty 1

## 2018-02-22 MED ORDER — PROCHLORPERAZINE 25 MG RE SUPP: 12.5000 mg | Freq: Four times a day (QID) | RECTAL | Status: DC | PRN

## 2018-02-22 MED ORDER — HYDROCHLOROTHIAZIDE 25 MG PO TABS
25.0000 mg | ORAL_TABLET | Freq: Every day | ORAL | Status: DC
  Administered 2018-02-22: 25 mg via ORAL
  Filled 2018-02-22: qty 1

## 2018-02-22 MED ORDER — INSULIN ASPART PROT & ASPART (70-30 MIX) 100 UNIT/ML ~~LOC~~ SUSP
30.0000 [IU] | Freq: Two times a day (BID) | SUBCUTANEOUS | Status: DC
  Administered 2018-02-22: 30 [IU] via SUBCUTANEOUS

## 2018-02-22 MED ORDER — GEMFIBROZIL 600 MG PO TABS
600.0000 mg | ORAL_TABLET | Freq: Two times a day (BID) | ORAL | Status: DC
  Administered 2018-02-23 – 2018-03-05 (×22): 600 mg via ORAL
  Filled 2018-02-22 (×22): qty 1

## 2018-02-22 MED ORDER — INSULIN ASPART PROT & ASPART (70-30 MIX) 100 UNIT/ML ~~LOC~~ SUSP
27.0000 [IU] | Freq: Two times a day (BID) | SUBCUTANEOUS | Status: DC
  Administered 2018-02-22: 27 [IU] via SUBCUTANEOUS

## 2018-02-22 MED ORDER — DIPHENHYDRAMINE HCL 12.5 MG/5ML PO ELIX: 12.5000 mg | ORAL_SOLUTION | Freq: Four times a day (QID) | ORAL | Status: DC | PRN

## 2018-02-22 MED ORDER — LOSARTAN POTASSIUM 50 MG PO TABS
100.0000 mg | ORAL_TABLET | Freq: Every day | ORAL | Status: DC
  Administered 2018-02-22: 100 mg via ORAL
  Filled 2018-02-22: qty 2

## 2018-02-22 MED ORDER — GUAIFENESIN-DM 100-10 MG/5ML PO SYRP
5.0000 mL | ORAL_SOLUTION | Freq: Four times a day (QID) | ORAL | Status: DC | PRN
  Administered 2018-03-01: 10 mL via ORAL
  Filled 2018-02-22: qty 10

## 2018-02-22 MED ORDER — PROCHLORPERAZINE EDISYLATE 5 MG/ML IJ SOLN
5.0000 mg | Freq: Four times a day (QID) | INTRAMUSCULAR | Status: DC | PRN
  Filled 2018-02-22: qty 2

## 2018-02-22 MED ORDER — INSULIN ASPART PROT & ASPART (70-30 MIX) 100 UNIT/ML ~~LOC~~ SUSP
27.0000 [IU] | Freq: Two times a day (BID) | SUBCUTANEOUS | Status: DC
  Administered 2018-02-23 – 2018-02-25 (×4): 27 [IU] via SUBCUTANEOUS

## 2018-02-22 MED ORDER — CLOPIDOGREL BISULFATE 75 MG PO TABS
75.0000 mg | ORAL_TABLET | Freq: Every morning | ORAL | Status: DC
  Administered 2018-02-23 – 2018-03-05 (×11): 75 mg via ORAL
  Filled 2018-02-22 (×11): qty 1

## 2018-02-22 MED ORDER — ENOXAPARIN SODIUM 40 MG/0.4ML ~~LOC~~ SOLN
40.0000 mg | SUBCUTANEOUS | Status: DC
  Administered 2018-02-22 – 2018-03-04 (×11): 40 mg via SUBCUTANEOUS
  Filled 2018-02-22 (×11): qty 0.4

## 2018-02-22 MED ORDER — LEVOTHYROXINE SODIUM 88 MCG PO TABS
88.0000 ug | ORAL_TABLET | Freq: Every day | ORAL | Status: DC
  Administered 2018-02-23 – 2018-03-05 (×11): 88 ug via ORAL
  Filled 2018-02-22 (×11): qty 1

## 2018-02-22 MED ORDER — BACLOFEN 10 MG PO TABS
10.0000 mg | ORAL_TABLET | Freq: Three times a day (TID) | ORAL | Status: DC
  Administered 2018-02-22 – 2018-02-24 (×5): 10 mg via ORAL
  Filled 2018-02-22 (×5): qty 1

## 2018-02-22 MED ORDER — TRAZODONE HCL 50 MG PO TABS
25.0000 mg | ORAL_TABLET | Freq: Every evening | ORAL | Status: DC | PRN
  Administered 2018-02-26: 50 mg via ORAL
  Filled 2018-02-22: qty 1

## 2018-02-22 MED ORDER — ALUM & MAG HYDROXIDE-SIMETH 200-200-20 MG/5ML PO SUSP: 30.0000 mL | ORAL | Status: DC | PRN

## 2018-02-22 MED ORDER — INSULIN ASPART 100 UNIT/ML ~~LOC~~ SOLN
0.0000 [IU] | Freq: Three times a day (TID) | SUBCUTANEOUS | Status: DC
  Administered 2018-02-23: 5 [IU] via SUBCUTANEOUS
  Administered 2018-02-23 (×2): 2 [IU] via SUBCUTANEOUS
  Administered 2018-02-24: 3 [IU] via SUBCUTANEOUS
  Administered 2018-02-24: 5 [IU] via SUBCUTANEOUS
  Administered 2018-02-24: 3 [IU] via SUBCUTANEOUS
  Administered 2018-02-25: 7 [IU] via SUBCUTANEOUS
  Administered 2018-02-25: 3 [IU] via SUBCUTANEOUS
  Administered 2018-02-25: 7 [IU] via SUBCUTANEOUS
  Administered 2018-02-26: 1 [IU] via SUBCUTANEOUS
  Administered 2018-02-26 (×2): 2 [IU] via SUBCUTANEOUS
  Administered 2018-02-27 (×3): 1 [IU] via SUBCUTANEOUS
  Administered 2018-02-28 – 2018-03-01 (×2): 2 [IU] via SUBCUTANEOUS
  Administered 2018-03-01: 3 [IU] via SUBCUTANEOUS
  Administered 2018-03-02 – 2018-03-03 (×6): 2 [IU] via SUBCUTANEOUS
  Administered 2018-03-04: 3 [IU] via SUBCUTANEOUS
  Administered 2018-03-04: 2 [IU] via SUBCUTANEOUS
  Administered 2018-03-04: 3 [IU] via SUBCUTANEOUS
  Administered 2018-03-05: 2 [IU] via SUBCUTANEOUS

## 2018-02-22 MED ORDER — BISACODYL 10 MG RE SUPP: 10.0000 mg | Freq: Every day | RECTAL | Status: DC | PRN

## 2018-02-22 MED ORDER — CILOSTAZOL 100 MG PO TABS
100.0000 mg | ORAL_TABLET | Freq: Two times a day (BID) | ORAL | Status: DC
  Administered 2018-02-22 – 2018-03-05 (×22): 100 mg via ORAL
  Filled 2018-02-22 (×22): qty 1

## 2018-02-22 MED ORDER — FLEET ENEMA 7-19 GM/118ML RE ENEM: 1.0000 | ENEMA | Freq: Once | RECTAL | Status: DC | PRN

## 2018-02-22 MED ORDER — INSULIN ASPART 100 UNIT/ML ~~LOC~~ SOLN
0.0000 [IU] | Freq: Every day | SUBCUTANEOUS | Status: DC
  Administered 2018-02-22: 2 [IU] via SUBCUTANEOUS
  Administered 2018-02-23: 3 [IU] via SUBCUTANEOUS
  Administered 2018-02-24: 4 [IU] via SUBCUTANEOUS

## 2018-02-22 MED ORDER — BACLOFEN 10 MG PO TABS: 10.0000 mg | ORAL_TABLET | Freq: Three times a day (TID) | ORAL | 0 refills | Status: DC

## 2018-02-22 MED ORDER — HYDROCHLOROTHIAZIDE 25 MG PO TABS
25.0000 mg | ORAL_TABLET | Freq: Every day | ORAL | Status: DC
  Administered 2018-02-23 – 2018-02-24 (×2): 25 mg via ORAL
  Filled 2018-02-22 (×2): qty 1

## 2018-02-22 MED ORDER — SENNOSIDES-DOCUSATE SODIUM 8.6-50 MG PO TABS: 1.0000 | ORAL_TABLET | Freq: Every evening | ORAL | Status: DC | PRN

## 2018-02-22 MED ORDER — LOSARTAN POTASSIUM 50 MG PO TABS
100.0000 mg | ORAL_TABLET | Freq: Every day | ORAL | Status: DC
  Administered 2018-02-23 – 2018-03-05 (×10): 100 mg via ORAL
  Filled 2018-02-22 (×10): qty 2

## 2018-02-22 MED ORDER — ACETAMINOPHEN 325 MG PO TABS
325.0000 mg | ORAL_TABLET | ORAL | Status: DC | PRN
  Administered 2018-03-04: 650 mg via ORAL
  Filled 2018-02-22: qty 2

## 2018-02-22 NOTE — Consult Note (Addendum)
   [facility] CM Inpatient Consult   02/22/2018  [name] 12-20-1950 [id number]    Patient has been followed by [facility].   Spoke with [name] at bedside. She is currently undergoing continuous  EEG monitoring.    [name] agreeable to [facility] follow up.   Confirmed with inpatient LCSW discharge plan is for CIR when medically stable.   Will continue to follow and make [place] referrals when appropriate.    Marthenia Rolling, MSN-Ed, RN,BSN [facility] Liaison [phone]

## 2018-02-22 NOTE — Progress Notes (Signed)
Patient continuing to have left arm tremors through out the night, they are becoming less intense and more infrequent. Patient is tolerating EEG well.

## 2018-02-22 NOTE — Progress Notes (Signed)
Physical Therapy Treatment Patient Details Name: [name] MRN: [id number] DOB: 04/24/1951 Today's Date: 02/22/2018    History of Present Illness 67 y.o. female admitted for HA, left sided weakness and slurred speech. Brain MR revealed acute to subacute right corona radiata infarct with a few adjacent punctate foci. PMH includes but not limited to hx of R genu CVA, DM, HTN, PVD, HLD, and GERD.    PT Comments    Returned to work with patient per CSW request. Patient received asleep in bed but easily woken and willing to work with PT/OT today; of note, she was still connected to the EEG machine limiting mobility today so focused on advancing functional exercises for balance, strength, and coordination today (see exercise list for specific details). She was left in bed with all needs met, alarm activated this afternoon. She continues to remain appropriate for aggressive therapies in the CIR environment moving forward.    Follow Up Recommendations  CIR     Equipment Recommendations  Other (comment)(defer to next venue )    Recommendations for Other Services Rehab consult     Precautions / Restrictions Precautions Precautions: Fall Restrictions Weight Bearing Restrictions: No    Mobility  Bed Mobility Overal bed mobility: Needs Assistance Bed Mobility: Supine to Sit;Sit to Supine     Supine to sit: Min guard Sit to supine: Min guard   General bed mobility comments: min guard for safety   Transfers Overall transfer level: Needs assistance Equipment used: None Transfers: Sit to/from Stand Sit to Stand: Min guard         General transfer comment: Min guard for safety and to assist in steadying   Ambulation/Gait         Gait velocity: decreased   General Gait Details: unable due to still being connected to EEG machine/RN requests that patient stay in room while on machine    Stairs             Wheelchair Mobility    Modified Rankin (Stroke Patients  Only) Modified Rankin (Stroke Patients Only) Pre-Morbid Rankin Score: No symptoms Modified Rankin: Moderate disability     Balance Overall balance assessment: Needs assistance Sitting-balance support: Feet supported;No upper extremity supported Sitting balance-Leahy Scale: Good     Standing balance support: No upper extremity supported;Single extremity supported Standing balance-Leahy Scale: Fair Standing balance comment: mild unsteadiness in standing especially with dynamic tasks                             Cognition Arousal/Alertness: Awake/alert Behavior During Therapy: WFL for tasks assessed/performed;Impulsive Overall Cognitive Status: Impaired/Different from baseline Area of Impairment: Attention;Memory;Following commands;Safety/judgement;Awareness                   Current Attention Level: Selective Memory: Decreased recall of precautions;Decreased short-term memory Following Commands: Follows one step commands with increased time;Follows multi-step commands inconsistently Safety/Judgement: Decreased awareness of safety;Decreased awareness of deficits Awareness: Emergent   General Comments: patient with less impulsivity today however continues to have difficulty in following multistep commands       Exercises General Exercises - Lower Extremity Long Arc Quad: Both;10 reps;Seated(manual resistance ) Hip ABduction/ADduction: Both;Seated(manual resistance from PT ) Hip Flexion/Marching: Both;15 reps Heel Raises: Both;15 reps(with B UE reach overhead ) Other Exercises Other Exercises: lateral and cross midline reaches for balance     General Comments General comments (skin integrity, edema, etc.): patient connected to EEG machine limiting mobility today  Pertinent Vitals/Pain Pain Assessment: No/denies pain Pain Score: 0-No pain Pain Intervention(s): Limited activity within patient's tolerance;Monitored during session    Home Living                       Prior Function            PT Goals (current goals can now be found in the care plan section) Acute Rehab PT Goals Patient Stated Goal: to get better PT Goal Formulation: With patient/family Time For Goal Achievement: 03/03/18 Potential to Achieve Goals: Good Progress towards PT goals: Progressing toward goals    Frequency    Min 4X/week      PT Plan Current plan remains appropriate    Co-evaluation PT/OT/SLP Co-Evaluation/Treatment: Yes Reason for Co-Treatment: For patient/therapist safety;Other (comment)(for more advanced strength/balance and functional treatments ) PT goals addressed during session: Balance;Strengthening/ROM        AM-PAC PT "6 Clicks" Daily Activity  Outcome Measure  Difficulty turning over in bed (including adjusting bedclothes, sheets and blankets)?: None Difficulty moving from lying on back to sitting on the side of the bed? : None Difficulty sitting down on and standing up from a chair with arms (e.g., wheelchair, bedside commode, etc,.)?: None Help needed moving to and from a bed to chair (including a wheelchair)?: A Little Help needed walking in hospital room?: A Little Help needed climbing 3-5 steps with a railing? : A Lot 6 Click Score: 20    End of Session Equipment Utilized During Treatment: Gait belt Activity Tolerance: Patient tolerated treatment well Patient left: in bed;with call bell/phone within reach;with bed alarm set   PT Visit Diagnosis: Unsteadiness on feet (R26.81);Other abnormalities of gait and mobility (R26.89);Muscle weakness (generalized) (M62.81);History of falling (Z91.81);Difficulty in walking, not elsewhere classified (R26.2);Other symptoms and signs involving the nervous system (R29.898)     Time: 6295-2841 PT Time Calculation (min) (ACUTE ONLY): 27 min  Charges:  $Therapeutic Exercise: 8-22 mins                    G Codes:       Deniece Ree PT, DPT, CBIS  Supplemental Physical  Therapist [place]   Pager [phone]

## 2018-02-22 NOTE — Progress Notes (Addendum)
Occupational Therapy Treatment Patient Details Name: [name] MRN: [id number] DOB: [birth year] Today's Date: 02/22/2018    History of present illness 67 y.o. female admitted for HA, left sided weakness and slurred speech. Brain MR revealed acute to subacute right corona radiata infarct with a few adjacent punctate foci. PMH includes but not limited to hx of R genu CVA, DM, HTN, PVD, HLD, and GERD.   OT comments  Pt progressing towards OT goals, presents supine in bed, initially sleepy, but able to arouse and agreeable to working with therapy. Pt engaging in standing dynamic reaching/balance activities during session, requiring MinA for static standing balance and up to [place] during dynamic tasks (+2 for safety). Mobility limited this session to standing EOB due to pt still connected to EEG machine. Pt continues to demonstrate LUE functional impairments including LUE weakness, decreased fine motor, with onset of spasms/spastic movements with increased use of LUE fine motor movements. Pt with decreased impulsivity this session, though requiring multimodal cues and increased time to follow 1-2 step commands. Pt remains motivated to work and progress with therapy, feel she remains an excellent candidate for CIR level services; will continue to follow acutely to progress pt towards established OT goals.   Follow Up Recommendations  CIR    Equipment Recommendations  Other (comment)(TBD in next venue )          Precautions / Restrictions Precautions Precautions: Fall Restrictions Weight Bearing Restrictions: No       Mobility Bed Mobility Overal bed mobility: Needs Assistance Bed Mobility: Supine to Sit;Sit to Supine     Supine to sit: Min guard Sit to supine: Min guard   General bed mobility comments: min guard for safety   Transfers Overall transfer level: Needs assistance Equipment used: None Transfers: Sit to/from Stand Sit to Stand: Min assist;+2 safety/equipment          General transfer comment: MinA to steady when rising into standing; intermittent use of HHA      Balance Overall balance assessment: Needs assistance Sitting-balance support: Feet supported;No upper extremity supported Sitting balance-Leahy Scale: Good     Standing balance support: No upper extremity supported;Single extremity supported Standing balance-Leahy Scale: Fair Standing balance comment: mild unsteadiness in standing especially with dynamic tasks                            ADL either performed or assessed with clinical judgement   ADL Overall ADL's : Needs assistance/impaired     Grooming: Set up;Sitting;Wash/dry face                               Functional mobility during ADLs: Minimal assistance;Moderate assistance;+2 for safety/equipment;+2 for physical assistance General ADL Comments: pt continues to require cues to consistently follow instructions; focus on dynamic standing and reaching activities, UB/LB strengthening during session; session limited to EOB activity due to pt still connected to EEG      Vision   Additional Comments: pt continues to report no visual changes               Cognition Arousal/Alertness: Awake/alert Behavior During Therapy: [facility] for tasks assessed/performed;Impulsive Overall Cognitive Status: Impaired/Different from baseline Area of Impairment: Attention;Memory;Following commands;Safety/judgement;Awareness                   Current Attention Level: Sustained Memory: Decreased recall of precautions;Decreased short-term memory Following Commands: Follows one  step commands with increased time;Follows multi-step commands inconsistently Safety/Judgement: Decreased awareness of safety;Decreased awareness of deficits Awareness: Emergent   General Comments: patient with less impulsivity today however continues to have difficulty in following multistep commands, requires increased time and multimodal  cues to correctly follow instructions; pt also requiring cues for attending to task in minimally stimulating environment (TV on)         Exercises General Exercises - Upper Extremity Shoulder Flexion: AROM;AAROM;Both;10 reps;Standing(AAROM for LUE ) Digit Composite Flexion: AROM;10 reps;Left;Seated Composite Extension: AROM;10 reps;Left;Seated General Exercises - Lower Extremity Long Arc Quad: Both;10 reps;Seated(manual resistance ) Hip ABduction/ADduction: Both;Seated(manual resistance from PT ) Hip Flexion/Marching: Both;15 reps Heel Raises: Both;15 reps(with B UE reach overhead ) Other Exercises Other Exercises: lateral and cross midline reaches for balance  Other Exercises: encouraged pt to continue use of LUE throughout the day; pt currently with significant difficulty completing finger opposition with LUE, can open/close fist; practiced opening/closing containers using LUE; increased use of LUE resulting in LUE spasticity           General Comments patient connected to EEG machine limiting mobility today     Pertinent Vitals/ Pain       Pain Assessment: No/denies pain Pain Score: 0-No pain Pain Intervention(s): Limited activity within patient's tolerance;Monitored during session                                                          Frequency  Min 2X/week        Progress Toward Goals  OT Goals(current goals can now be found in the care plan section)  Progress towards OT goals: Progressing toward goals  Acute Rehab OT Goals Patient Stated Goal: to get better OT Goal Formulation: With patient/family Time For Goal Achievement: 03/04/18 Potential to Achieve Goals: Good  Plan Discharge plan remains appropriate    Co-evaluation    PT/OT/SLP Co-Evaluation/Treatment: Yes Reason for Co-Treatment: For patient/therapist safety;Other (comment)(for more advanced strength, balance, and functional treatment; for line management (still connected  to EEG)) PT goals addressed during session: Balance;Strengthening/ROM OT goals addressed during session: ADL's and self-care;Strengthening/ROM      AM-PAC PT "6 Clicks" Daily Activity     Outcome Measure   Help from another person eating meals?: None Help from another person taking care of personal grooming?: A Little Help from another person toileting, which includes using toliet, bedpan, or urinal?: A Little Help from another person bathing (including washing, rinsing, drying)?: A Lot Help from another person to put on and taking off regular upper body clothing?: A Little Help from another person to put on and taking off regular lower body clothing?: A Lot 6 Click Score: 17    End of Session Equipment Utilized During Treatment: Gait belt  OT Visit Diagnosis: Unsteadiness on feet (R26.81);Other abnormalities of gait and mobility (R26.89);Muscle weakness (generalized) (M62.81);History of falling (Z91.81);Other symptoms and signs involving the nervous system (R29.898)   Activity Tolerance Patient tolerated treatment well   Patient Left in bed;with call bell/phone within reach;with bed alarm set   Nurse Communication Mobility status        Time: 0630-1601 OT Time Calculation (min): 27 min  Charges: OT General Charges $OT Visit: 1 Visit OT Treatments $Therapeutic Activity: 8-22 mins  Lou Cal, OT Pager [phone] 02/22/2018    Bubba Hales L  Megan Salon 02/22/2018, 3:51 PM

## 2018-02-22 NOTE — Procedures (Signed)
  Electroencephalogram report- LTM   Data acquisition: 10-20 electrode placement.  Additional T1, T2, and EKG electrodes; 26 channel digital referential acquisition reformatted to 18 channel/7 channel coronal bipolar     Spike detection: ON     Seizure detection: ON   Beginning time: 02/21/18 at 08 34 am   Ending time: 02/21/18 at 08 54 am  CPT: 95951 Day of study: day 2   This  intensive EEG monitoring with simultaneous video monitoring was performed for this patient with spells left-sided weakness as a part of ongoing series to capture events of interest and determine if these are seizures.    Medications as per EMR  Day 1 There was no pushbutton activations events during this recording.  Automated spike detection program did not detect any spikes. Seizure detection program did not detect any seizures .   Waking background activities were marked by 8 cps posterior dominant developed on the as opposed to right.  There is a persistent right hemispheric delta slowing involving the right frontotemporal region throughout the recording.  There is no interictal epileptiform discharges.  No clinical or subclinical seizures present.  Day 2:  numerous events of interest were recorded marked by difficulty using left arm, some tremulousness particular with intention or action or trying to utilize left arm.  Sometimes patients opening and closing his left fist.  Sometimes there is a she is trying to move her fingers.  Time there is a more complex left arm movements side to side up and down in a synchronous fashion events appeared to be in waxing and waning fashion sometimes lasting a long time however decreased with rest and increased with again trying to utilize left arm.  At times patient also complaining of left face been" drawn "" simultaneously with the left arm movements.  With patient is completely awake alert and aware during these spells.  At times she is eating and utilizing left arm during that  time accompanied by numerous pushbutton events as patient experiencing potentially some difficulties in using the left arm.  Electrographically was all these numerous events of interest there is unchanged waking background activity is present prior during and after these events of interest.  Semiology of these events of interest are inconsistent with seizures.  In addition left facial drawing and asymmetry have volitional component possibly.  However left arm movements and difficulty associated with utilizing left arm could be related to other etiologies including underlying left arm weakness itself possibly some movement disorder.  Background activities were unchanged from previously recorded in marked by right frontotemporal slowing suggestive of neuronal dysfunction in the right frontotemporal region.  Clinical interpretation: This day 2 of intensive EEG monitoring with simultaneous video monitoring did not record any clinical subclinical seizures.  EEG was abnormal due to right frontotemporal delta slowing suggestive of neuronal dysfunction on a structural vascular or degenerative basis.   Numerous events of interest were recorded involving left arm and left face as discussed above in details.  There was no any EEG changes to suggest seizures.  However sometimes with simple partial seizures that there is no significant EEG changes.  However semiology of this events are inconsistent with simple partial seizures.  Again could be related to underlying weakness itself, movement disorder versus some volitional component associated with this events.  Clinical correlation is advised.

## 2018-02-22 NOTE — Plan of Care (Signed)
Adequate for discharge.

## 2018-02-22 NOTE — Progress Notes (Signed)
SLP Cancellation Note  Patient Details Name: [name] MRN: [id number] DOB: 17-Jul-1951   Cancelled treatment:       Reason Eval/Treat Not Completed: Fatigue/lethargy limiting ability to participate   Assunta Curtis 02/22/2018, 2:24 PM

## 2018-02-22 NOTE — Progress Notes (Signed)
Medicine attending: I examined this patient today together with resident physicians Dr. Guadlupe Spanish and Kalman Shan and I concur with her evaluation and management plan which we discussed together. She is still having intermittent [place] type seizure activity of her left upper extremity.  She has been on almost continuous EEG monitoring since admission.  Neurologist present at bedside.  No defined seizure focus has been found.  No significant resolution of her symptoms on triple anticonvulsant therapy.  Working diagnosis at this point is a typical muscle spasm.  Recommendation from neurologist is to discontinue anticonvulsants and put her on a trial of baclofen. She is otherwise neurologically stable.  She still has clumsiness of her left hand. She will be reevaluated by inpatient rehab today and if otherwise acceptable, be discharged to rehab. Lab studies all stable.  Chronic mild normochromic anemia.  No electrolyte abnormalities. Blood glucose in range 101-271.

## 2018-02-22 NOTE — H&P (Addendum)
Physical Medicine and Rehabilitation Admission H&P    Chief Complaint  Patient presents with  . Functional deficits due to stroke.     HPI:  [name] is a 67 year old female with history of T2DM, GERD, PVD, CVA X 2 who was admitted on 02/16/18 with one week history of intermittent left sided weakness as well as multiple episodes of generalized seizures. History taken from chart review and patient. She had witnessed seizure with gaze to left and was loaded with dilantin. She was started on Keppra for management of new onset seizures. Marland Kitchen MRI brain reviewed, showing right basal ganglia infarct.  Per report MRI/MRA brain done revealing acute/early subacute infarct in right mid corona radiata with few adjacent punctate foci.  2D echo done revealing #F 60-65% with grade 1 diastolic dysfunction.  Carotid dopplers were negative for ICA stenosis.  Family reported that medications not filled by pharmacy 3 weeks PTA. Dr. Leonie Man felt that stroke due to small vessel disease--to continue Plavix and Pletal.  She has had intermittent seizure activity (head turn to the left with left arm tonic flexion) therefore Depakote added and Keppra was increased to 1000 mg bid on 04/08. Pulling sensation LUE felt to be due to muscle spasms. But continued to have seizure type activity therefore was placed on long term monitor. She continued to have symptoms but no seizure activity noted and Dr. Erlinda Hong felt that symptoms due to muscle spasms, weakness versus some volitional component.  AEDs were discontinued and she was started on baclofen to help manage symptoms.   Patient with resultant unsteady gait, poor safety with impulsivity affecting mobility and ADLs. CIR recommended due to functional deficits.     Review of Systems  Constitutional: Negative for chills and fever.  HENT: Negative for hearing loss and tinnitus.   Eyes: Negative for blurred vision and double vision.  Respiratory: Negative for cough and shortness of  breath.   Cardiovascular: Negative for chest pain and palpitations.  Gastrointestinal: Negative for constipation, heartburn and nausea.  Genitourinary: Negative for frequency and urgency.  Musculoskeletal: Negative for back pain, myalgias and neck pain.  Skin: Negative for itching and rash.  Neurological: Positive for focal weakness and headaches. Negative for dizziness, sensory change and speech change.  Psychiatric/Behavioral: The patient has insomnia. The patient is not nervous/anxious.   All other systems reviewed and are negative.     Past Medical History:  Diagnosis Date  . Anemia   . Arteriosclerotic cardiovascular disease   . Claudication in peripheral vascular disease ([place])   . CVA (cerebral vascular accident) ([place]) 01/2011   affected left side  . Diabetes mellitus 2000  . GERD (gastroesophageal reflux disease)   . Hyperlipidemia   . Hypertension   . Hypothyroidism   . Renal insufficiency   . Vitamin D deficiency     Past Surgical History:  Procedure Laterality Date  . CESAREAN SECTION    . LEFT HEART CATHETERIZATION WITH CORONARY ANGIOGRAM N/A 03/08/2012   Procedure: LEFT HEART CATHETERIZATION WITH CORONARY ANGIOGRAM;  Surgeon: Sinclair Grooms, MD;  Location: [facility] CATH LAB;  Service: Cardiovascular;  Laterality: N/A;  . LEFT HEART CATHETERIZATION WITH CORONARY ANGIOGRAM N/A 12/17/2012   Procedure: LEFT HEART CATHETERIZATION WITH CORONARY ANGIOGRAM;  Surgeon: Laverda Page, MD;  Location: [facility] CATH LAB;  Service: Cardiovascular;  Laterality: N/A;  . LOWER EXTREMITY ANGIOGRAM N/A 12/17/2012   Procedure: LOWER EXTREMITY ANGIOGRAM;  Surgeon: Laverda Page, MD;  Location: [facility] CATH LAB;  Service:  Cardiovascular;  Laterality: N/A;  . TUBAL LIGATION      Family History  Problem Relation Age of Onset  . Diabetes Brother   . Prostate cancer Brother   . Diabetes Mother   . Diabetes Father   . Diabetes Sister   . Esophageal cancer Neg Hx   . Rectal cancer Neg Hx   .  Stomach cancer Neg Hx   . Colon cancer Neg Hx   . Breast cancer Neg Hx     Social History:  Lives alone. She reports that she quit smoking about 7 years ago. She has never used smokeless tobacco. She reports that she does not drink alcohol or use drugs.   Allergies  Allergen Reactions  . Dilantin [Phenytoin Sodium Extended]     Rash/itching  . Insulins Rash    Humalog 75/25 only  . Statins     "sick to my stomach"    Medications Prior to Admission  Medication Sig Dispense Refill  . cilostazol (PLETAL) 100 MG tablet Take 100 mg by mouth 2 (two) times daily.    . clopidogrel (PLAVIX) 75 MG tablet TAKE 1 TABLET BY MOUTH EVERY MORNING 90 tablet 3  . gemfibrozil (LOPID) 600 MG tablet TAKE 1 TABLET BY MOUTH TWICE A DAY 60 tablet 5  . hydrochlorothiazide (HYDRODIURIL) 25 MG tablet Take 1 tablet (25 mg total) by mouth daily. 90 tablet 3  . Insulin Isophane & Regular Human (NOVOLIN 70/30 FLEXPEN) (70-30) 100 UNIT/ML PEN Inject 36 Units into the skin daily. 15 mL 8  . levothyroxine (SYNTHROID, LEVOTHROID) 88 MCG tablet TAKE 1 TABLET BY MOUTH EVERY DAY BEFORE BREAKFAST 90 tablet 3  . liraglutide 18 MG/3ML SOPN Inject 0.2 mLs (1.2 mg total) into the skin daily. 6 mL 3  . losartan (COZAAR) 100 MG tablet TAKE 1 TABLET BY MOUTH EVERY DAY 90 tablet 3  . Multiple Vitamins-Minerals (MULTIVITAMIN ADULT PO) Take 1 tablet by mouth once a week. On Wednesday    . BD INSULIN SYRINGE ULTRAFINE 31G X 15/64" 0.5 ML MISC USE TO INJECT INSULIN TWO TIMES A DAY 100 each 11  . glucose blood (ACCU-CHEK AVIVA PLUS) test strip Use to check blood sugars twice a day.Dx Code: E11.22. 100 each 4  . Insulin Pen Needle (B-D UF III MINI PEN NEEDLES) 31G X 5 MM MISC The patient is insulin requiring, ICD 10 code E11.9. The patient injects insulin 2 times per day. 100 each 2  . ranitidine (ZANTAC) 150 MG tablet TAKE 1 TABLET BY MOUTH EVERY DAY AS NEEDED (Patient not taking: Reported on 02/16/2018) 30 tablet 4  . [EXPIRED]  sulfamethoxazole-trimethoprim (BACTRIM DS,SEPTRA DS) 800-160 MG tablet Take 1 tablet by mouth 2 (two) times daily for 3 days. 6 tablet 0    Drug Regimen Review  Drug regimen was reviewed and remains appropriate with no significant issues identified  Home: Home Living Family/patient expects to be discharged to:: Private residence Living Arrangements: Alone Available Help at Discharge: Friend(s), Available PRN/intermittently Type of Home: Apartment Home Access: Stairs to enter [employer] of Steps: 5 Entrance Stairs-Rails: Can reach both, Right, Left Home Layout: One level Bathroom Shower/Tub: Chiropodist: Standard Home Equipment: None  Lives With: Alone   Functional History: Prior Function Level of Independence: Independent Comments: history of 4 falls leading to this admission   Functional Status:  Mobility: Bed Mobility Overal bed mobility: Needs Assistance Bed Mobility: Supine to Sit, Sit to Supine Supine to sit: Min guard Sit to supine: Min  guard General bed mobility comments: min guard for safety  Transfers Overall transfer level: Needs assistance Equipment used: None Transfers: Sit to/from Stand Sit to Stand: Min assist, +2 safety/equipment General transfer comment: MinA to steady when rising into standing; intermittent use of HHA   Ambulation/Gait Ambulation/Gait assistance: Min assist Ambulation Distance (Feet): 200 Feet Assistive device: None Gait Pattern/deviations: Step-through pattern, Drifts right/left, Decreased stride length General Gait Details: unable due to still being connected to EEG machine/RN requests that patient stay in room while on machine  Gait velocity: decreased Gait velocity interpretation: Below normal speed for age/gender    ADL: ADL Overall ADL's : Needs assistance/impaired Eating/Feeding: Set up, Sitting, Supervision/ safety Grooming: Set up, Sitting, Wash/dry face Upper Body Bathing: Set up,  Supervision/ safety, Sitting Lower Body Bathing: Minimal assistance, Cueing for safety, Sit to/from stand Upper Body Dressing : Set up, Supervision/safety, Sitting Lower Body Dressing: Minimal assistance, Cueing for safety, Sit to/from stand Toilet Transfer: Minimal assistance, Min guard, RW, Ambulation, Grab bars, Comfort height toilet Toileting- Clothing Manipulation and Hygiene: Min guard, Sit to/from stand Scientist, research (medical): Minimal assistance, Ambulation, Grab bars, 3 in 1 Functional mobility during ADLs: Minimal assistance, Moderate assistance, +2 for safety/equipment, +2 for physical assistance General ADL Comments: pt continues to require cues to consistently follow instructions; focus on dynamic standing and reaching activities, UB/LB strengthening during session; session limited to EOB activity due to pt still connected to EEG   Cognition: Cognition Overall Cognitive Status: Impaired/Different from baseline Arousal/Alertness: Awake/alert Orientation Level: Oriented to person, Oriented to place, Oriented to situation, Disoriented to time Attention: Selective Selective Attention: Impaired Selective Attention Impairment: Verbal basic, Functional basic Memory: Impaired Memory Impairment: Other (comment)(working memory) Awareness: Impaired Awareness Impairment: Intellectual impairment, Emergent impairment, Anticipatory impairment Problem Solving: Impaired Problem Solving Impairment: Functional basic, Verbal basic Behaviors: Impulsive Safety/Judgment: Impaired Cognition Arousal/Alertness: Awake/alert Behavior During Therapy: WFL for tasks assessed/performed, Impulsive Overall Cognitive Status: Impaired/Different from baseline Area of Impairment: Attention, Memory, Following commands, Safety/judgement, Awareness Current Attention Level: Sustained Memory: Decreased recall of precautions, Decreased short-term memory Following Commands: Follows one step commands with increased  time, Follows multi-step commands inconsistently Safety/Judgement: Decreased awareness of safety, Decreased awareness of deficits Awareness: Emergent General Comments: patient with less impulsivity today however continues to have difficulty in following multistep commands, requires increased time and multimodal cues to correctly follow instructions; pt also requiring cues for attending to task in minimally stimulating environment (TV on)    Blood pressure (!) 155/79, pulse 95, temperature 97.7 F (36.5 C), temperature source Oral, resp. rate 18, height 5' 2"  (1.575 m), weight 76 kg (167 lb 8.8 oz), SpO2 100 %. Physical Exam  Nursing note and vitals reviewed. Constitutional: She is oriented to person, place, and time. She appears well-developed and well-nourished. No distress.  HENT:  Head: Normocephalic and atraumatic.  Mouth/Throat: Oropharynx is clear and moist.  Eyes: Pupils are equal, round, and reactive to light. Conjunctivae and EOM are normal.  Neck: Normal range of motion. Neck supple.  Cardiovascular: Normal rate and regular rhythm. No JVD. Respiratory: Effort normal and breath sounds normal. No stridor. No respiratory distress. She has no wheezes.  GI: Soft. Bowel sounds are normal. She exhibits no distension. There is no tenderness.  Musculoskeletal: She exhibits no edema or tenderness.  Neurological: She is alert and oriented to person, place, and time.  She was able to follow simple one step command. Speech clear. Motor: LUE/LLE 4+/5 proximal to distal with ataxia RUE/RLE: 5/5 proximal to distal Skin:  Skin is warm and dry. She is not diaphoretic.  Psychiatric: Her speech is normal. Her mood appears anxious. She is slowed. Cognition and memory are normal. She expresses inappropriate judgment.  Has poor awareness of deficits--anxious that she is unable to be independent at this time but expressed surprise that she'll be here thorough the weekend.     Results for orders placed  or performed during the hospital encounter of 02/15/18 (from the past 48 hour(s))  Glucose, capillary     Status: Abnormal   Collection Time: 02/20/18  9:20 PM  Result Value Ref Range   Glucose-Capillary 215 (H) 65 - 99 mg/dL   Comment 1 Notify RN    Comment 2 Document in Chart   Glucose, capillary     Status: Abnormal   Collection Time: 02/21/18  6:23 AM  Result Value Ref Range   Glucose-Capillary 201 (H) 65 - 99 mg/dL   Comment 1 Notify RN    Comment 2 Document in Chart   Valproic acid level     Status: None   Collection Time: 02/21/18  6:26 AM  Result Value Ref Range   Valproic Acid Lvl 59 50.0 - 100.0 ug/mL    Comment: Performed at [facility] [address], [place], [place] [address]  CBC     Status: Abnormal   Collection Time: 02/21/18  6:26 AM  Result Value Ref Range   WBC 5.9 4.0 - 10.5 K/uL   RBC 3.72 (L) 3.87 - 5.11 MIL/uL   Hemoglobin 10.8 (L) 12.0 - 15.0 g/dL   HCT 32.8 (L) 36.0 - 46.0 %   MCV 88.2 78.0 - 100.0 fL   MCH 29.0 26.0 - 34.0 pg   MCHC 32.9 30.0 - 36.0 g/dL   RDW 14.6 11.5 - 15.5 %   Platelets 284 150 - 400 K/uL    Comment: Performed at [facility], [place] [address] [place], [place] [address]  Basic metabolic panel     Status: Abnormal   Collection Time: 02/21/18  6:26 AM  Result Value Ref Range   Sodium 140 135 - 145 mmol/L   Potassium 4.2 3.5 - 5.1 mmol/L   Chloride 105 101 - 111 mmol/L   CO2 24 22 - 32 mmol/L   Glucose, Bld 203 (H) 65 - 99 mg/dL   BUN 15 6 - 20 mg/dL   Creatinine, Ser 1.27 (H) 0.44 - 1.00 mg/dL   Calcium 9.6 8.9 - 10.3 mg/dL   GFR calc non Af Amer 43 (L) >60 mL/min   GFR calc Af Amer 50 (L) >60 mL/min    Comment: (NOTE) The eGFR has been calculated using the CKD EPI equation. This calculation has not been validated in all clinical situations. eGFR's persistently <60 mL/min signify possible Chronic Kidney Disease.    Anion gap 11 5 - 15    Comment: Performed at [facility] [address]  [place], [place] [address]  Glucose, capillary     Status: Abnormal   Collection Time: 02/21/18 11:21 AM  Result Value Ref Range   Glucose-Capillary 188 (H) 65 - 99 mg/dL  Glucose, capillary     Status: Abnormal   Collection Time: 02/21/18  4:51 PM  Result Value Ref Range   Glucose-Capillary 249 (H) 65 - 99 mg/dL   Comment 1 Notify RN    Comment 2 Document in Chart   Glucose, capillary     Status: Abnormal   Collection Time: 02/21/18 10:04 PM  Result Value Ref Range   Glucose-Capillary 169 (H) 65 - 99 mg/dL   Comment 1 Notify RN    Comment 2 Document in Chart   Valproic acid level     Status: None   Collection Time: 02/22/18  6:24 AM  Result Value Ref Range   Valproic Acid Lvl 60 50.0 - 100.0 ug/mL    Comment: Performed at [facility] [address] [place], [place] [address]  CBC     Status: Abnormal   Collection Time: 02/22/18  6:24 AM  Result Value Ref Range   WBC 5.1 4.0 - 10.5 K/uL   RBC 3.61 (L) 3.87 - 5.11 MIL/uL   Hemoglobin 10.4 (L) 12.0 - 15.0 g/dL   HCT 31.7 (L) 36.0 - 46.0 %   MCV 87.8 78.0 - 100.0 fL   MCH 28.8 26.0 - 34.0 pg   MCHC 32.8 30.0 - 36.0 g/dL   RDW 14.7 11.5 - 15.5 %   Platelets 290 150 - 400 K/uL    Comment: Performed at [facility], [place] [address] [place], [place] [address]  Basic metabolic panel     Status: Abnormal   Collection Time: 02/22/18  6:24 AM  Result Value Ref Range   Sodium 141 135 - 145 mmol/L   Potassium 4.0 3.5 - 5.1 mmol/L   Chloride 107 101 - 111 mmol/L   CO2 23 22 - 32 mmol/L   Glucose, Bld 164 (H) 65 - 99 mg/dL   BUN 18 6 - 20 mg/dL   Creatinine, Ser 1.33 (H) 0.44 - 1.00 mg/dL   Calcium 9.6 8.9 - 10.3 mg/dL   GFR calc non Af Amer 41 (L) >60 mL/min   GFR calc Af Amer 47 (L) >60 mL/min    Comment: (NOTE) The eGFR has been calculated using the CKD EPI equation. This calculation has not been validated in all clinical situations. eGFR's persistently <60 mL/min signify possible Chronic Kidney Disease.    Anion  gap 11 5 - 15    Comment: Performed at [facility] [address], [place], [place] [address]  Glucose, capillary     Status: Abnormal   Collection Time: 02/22/18  6:24 AM  Result Value Ref Range   Glucose-Capillary 162 (H) 65 - 99 mg/dL   Comment 1 Notify RN    Comment 2 Document in Chart   Glucose, capillary     Status: Abnormal   Collection Time: 02/22/18 11:49 AM  Result Value Ref Range   Glucose-Capillary 101 (H) 65 - 99 mg/dL   Comment 1 Notify RN    Comment 2 Document in Chart   Glucose, capillary     Status: Abnormal   Collection Time: 02/22/18  4:12 PM  Result Value Ref Range   Glucose-Capillary 271 (H) 65 - 99 mg/dL   Comment 1 Notify RN    Comment 2 Document in Chart    No results found.     Medical Problem List and Plan: 1. Unsteady gait, poor safety with impulsivity secondary to acute/early subacute infarct in right mid corona radiata with few adjacent punctate foci. 2.  DVT Prophylaxis/Anticoagulation: Pharmaceutical: Lovenox 3. Pain Management: tylenol prn  4. Mood: LCSW to follow for evaluation and support.  5. Neuropsych: This patient is not fully capable of making decisions on her own behalf. 6. Skin/Wound Care: routine pressure relief. 7. Fluids/Electrolytes/Nutrition: Monitor I/O. Check lytes in am 8. T2DM: Hgb 8.9 On 70/30 insulin bid--Monitor BS ac/hs and titrate  as indicated 9. Muscle spasms: Continue baclofen 10 tid. 10 HTN: Monitor BP bid. On HCTZ and Cozaar daily.  11. Hypothyroid: On supplement  12. Headaches: Reports right scalp sensitive to touch. Question addition of low dose topamax v/s gabapentin   Post Admission Physician Evaluation: 1. Preadmission assessment reviewed and changes made below. 2. Functional deficits secondary  to acute/early subacute infarct in right mid corona radiata with few adjacent punctate foci. 3. Patient is admitted to receive collaborative, interdisciplinary care between the physiatrist, rehab nursing staff,  and therapy team. 4. Patient's level of medical complexity and substantial therapy needs in context of that medical necessity cannot be provided at a lesser intensity of care such as a SNF. 5. Patient has experienced substantial functional loss from his/her baseline which was documented above under the "Functional History" and "Functional Status" headings.  Judging by the patient's diagnosis, physical exam, and functional history, the patient has potential for functional progress which will result in measurable gains while on inpatient rehab.  These gains will be of substantial and practical use upon discharge  in facilitating mobility and self-care at the household level. 66. Physiatrist will provide 24 hour management of medical needs as well as oversight of the therapy plan/treatment and provide guidance as appropriate regarding the interaction of the two. 7. 24 hour rehab nursing will assist with safety, disease management and patient education  and help integrate therapy concepts, techniques,education, etc. 8. PT will assess and treat for/with: Lower extremity strength, range of motion, stamina, balance, functional mobility, safety, adaptive techniques and equipment, coping skills, pain control, stroke education.   Goals are: Mod I. 9. OT will assess and treat for/with: ADL's, functional mobility, safety, upper extremity strength, adaptive techniques and equipment, ego support, and community reintegration.   Goals are: Mod I. Therapy may proceed with showering this patient. 10. SLP will assess and treat for/with: cognition.  Goals are: Supervision/Mod I. 11. Case Management and Social Worker will assess and treat for psychological issues and discharge planning. 12. Team conference will be held weekly to assess progress toward goals and to determine barriers to discharge. 13. Patient will receive at least 3 hours of therapy per day at least 5 days per week. 14. ELOS: 9-13 days.       15. Prognosis:   good  Delice Lesch, MD, ABPMR Bary Leriche, PA-C 02/22/2018

## 2018-02-22 NOTE — Progress Notes (Signed)
Inpatient Rehabilitation  Discussed case with Dr. Erlinda Hong and received medical clearance to admit patient to IP Rehab today.  I have updated insurance and have a bed to offer patient today.  Will proceed with admission.  Call if questions.   Carmelia Roller., CCC/SLP Admission Coordinator  [facility]  Cell [phone]

## 2018-02-22 NOTE — Progress Notes (Signed)
PT Cancellation Note  Patient Details Name: DEBHORA TITUS MRN: [id number] DOB: Apr 04, 1951   Cancelled Treatment:    Reason Eval/Treat Not Completed: Other (comment) Patient still on EEG/unable to participate in PT this afternoon. Plan to try to attempt to see patient on next day of service as/if able and schedule allows.    Deniece Ree PT, DPT, CBIS  Supplemental Physical Therapist [facility]   Pager [phone]

## 2018-02-22 NOTE — Progress Notes (Signed)
Pt arrived to unit via w/c, pt is alert and oriented and able to make needs known, daughter at bedside, no c/o pain, bed in low position, call light in reach, will continue plan of care

## 2018-02-22 NOTE — Progress Notes (Signed)
OT Cancellation Note  Patient Details Name: [name] MRN: [id number] DOB: Jun 19, 1951   Cancelled Treatment:    Reason Eval/Treat Not Completed: Patient at procedure or test/ unavailable; currently with EEG on. Will follow up as schedule allows.   [name], OT Pager [phone] 02/22/2018   [name] 02/22/2018, 9:37 AM

## 2018-02-22 NOTE — Progress Notes (Signed)
PT Cancellation Note  Patient Details Name: [name] MRN: [id number] DOB: 04-08-1951   Cancelled Treatment:    Reason Eval/Treat Not Completed: Other (comment) patient received sitting at edge of bed but in the midst of a study with another provider, plan to attempt to return as/if able and if schedule allows.    Deniece Ree PT, DPT, CBIS  Supplemental Physical Therapist [facility]   Pager [phone]

## 2018-02-22 NOTE — Progress Notes (Signed)
Patient reports having 2-3 more seizures since  9 am, states she pressed the button to record

## 2018-02-22 NOTE — Progress Notes (Signed)
   Subjective:  Patient evaluated this morning on rounds.  In good spirits.  Had a cheeseburger for breakfast.  Still having spastic events with left arm overnight.      Objective:  Vital signs in last 24 hours: Vitals:   02/22/18 0006 02/22/18 0413 02/22/18 0751 02/22/18 1151  BP: (!) 148/59 (!) 152/67 (!) 180/80 (!) 168/89  Pulse: 90 86 86 92  Resp: 16 18 18 18   Temp: 98.4 F (36.9 C) 98.4 F (36.9 C) 98.2 F (36.8 C) 98.3 F (36.8 C)  TempSrc: Oral Oral Oral Oral  SpO2: 97% 95% 95% 98%  Weight:      Height:         BMP Latest Ref Rng & Units 02/22/2018 02/21/2018 02/20/2018  Glucose 65 - 99 mg/dL 164(H) 203(H) 281(H)  BUN 6 - 20 mg/dL 18 15 15   Creatinine 0.44 - 1.00 mg/dL 1.33(H) 1.27(H) 1.33(H)  BUN/Creat Ratio 12 - 28 - - -  Sodium 135 - 145 mmol/L 141 140 139  Potassium 3.5 - 5.1 mmol/L 4.0 4.2 4.4  Chloride 101 - 111 mmol/L 107 105 103  CO2 22 - 32 mmol/L 23 24 23   Calcium 8.9 - 10.3 mg/dL 9.6 9.6 9.4   Physical Exam  Constitutional: She is well-developed, well-nourished, and in no distress.  Cardiovascular: Normal rate, regular rhythm and normal heart sounds.  Murmur (early systolic 2/6)  Neurological:  Left sided weakness minimal UE>LE, fine motor function of hand limited compared to right,  facial droop of left face improved.      Assessment/Plan:  Active Problems:   Hypothyroidism   Diabetes mellitus with stage 3 chronic kidney disease (HCC)   Hyperlipidemia   HYPERTENSION, BENIGN SYSTEMIC   PVD (peripheral vascular disease) (HCC)   CKD (chronic kidney disease), stage III (HCC)   UTI (urinary tract infection)   CVA (cerebral vascular accident) ([place])   Seizure ([place])  CVA On 4/6 patient had an MRI head showing subcentimeter acute/early subacute infarction in the right mid corona radiata with a few adjacent punctate foci . No hemorrhage or mass effect noted.  MRA head with no large vessel occlusion or large aneurysm identified.  Neurology consulted.    Stroke workup completed. Carotid dopplers with minimal plaque no CEA indicated.  Echocardiogram shows no visible PFO, normal EF, NRWMA, G1DD -continue with Plavix 75 mg PO daily -statin intolerant started on LOPID 600mg  BID -PT/OT evaluated pt, recommended CIR----rehab has accepted patient and awaiting clinical improvement  Spacticity  Patient appeared to be having seizures however EEG with no seizure activity noted.  Neurology had been titrating seizure medications during admission and will now discontinue and observe on baclofen.   -baclofen  Hypertension Hypertensive.  Restarted home losartan and HCTZ  -losartan and HCTZ  T2DM HgbA1C 8.9.  CBGs labile, adjusting insulin appropriately.  Currently 101. -CBG TID with meals, qHS -70/30, 27 units BID WC -SSI thin TID AC  CKD Stage 3: Cr at baseline of 1.4-1.6.  -Continue to monitor while inpatient.   VTE Prophylaxis: Heparin TID Code Status: Full   Dispo: Anticipated discharge to inpatient rehab pending clinical improvement   Valinda Party, DO 02/22/2018, 3:36 PM

## 2018-02-22 NOTE — Progress Notes (Signed)
STROKE TEAM PROGRESS NOTE   SUBJECTIVE (INTERVAL HISTORY) Pt primary team are at bedside. Pt on overnight EEG, had multiple events pushing buttons but non of them showed seizure activity. Pt with arm movement or finger manipulations may trigger the events which I witnessed again this am but not EEG correlation on recording. Events likely to be muscle spasm instead of seizure. Will d/c AEDs and put on baclofen. [name] ate well this am and happy to have bacon and cheeseburger served this am.   OBJECTIVE Temp:  [98.2 F (36.8 C)-98.6 F (37 C)] 98.3 F (36.8 C) (04/12 1151) Pulse Rate:  [85-92] 92 (04/12 1151) Cardiac Rhythm: Normal sinus rhythm (04/12 0700) Resp:  [16-20] 18 (04/12 1151) BP: (132-180)/(59-89) 168/89 (04/12 1151) SpO2:  [95 %-98 %] 98 % (04/12 1151)  CBC:  Recent Labs  Lab 02/15/18 2328  02/21/18 0626 02/22/18 0624  WBC 7.0   < > 5.9 5.1  NEUTROABS 4.4  --   --   --   HGB 10.9*   < > 10.8* 10.4*  HCT 33.8*   < > 32.8* 31.7*  MCV 89.2   < > 88.2 87.8  PLT 275   < > 284 290   < > = values in this interval not displayed.    Basic Metabolic Panel:  Recent Labs  Lab 02/18/18 0245  02/21/18 0626 02/22/18 0624  NA 138   < > 140 141  K 3.9   < > 4.2 4.0  CL 105   < > 105 107  CO2 21*   < > 24 23  GLUCOSE 153*   < > 203* 164*  BUN 18   < > 15 18  CREATININE 1.35*   < > 1.27* 1.33*  CALCIUM 9.1   < > 9.6 9.6  MG 2.1  --   --   --    < > = values in this interval not displayed.    Lipid Panel:     Component Value Date/Time   CHOL 168 02/16/2018 0200   CHOL 200 (H) 10/18/2016 1518   TRIG 255 (H) 02/16/2018 0200   HDL 30 (L) 02/16/2018 0200   HDL 34 (L) 10/18/2016 1518   CHOLHDL 5.6 02/16/2018 0200   VLDL 51 (H) 02/16/2018 0200   LDLCALC 87 02/16/2018 0200   LDLCALC 99 10/18/2016 1518   HgbA1c:  Lab Results  Component Value Date   HGBA1C 8.9 (H) 02/16/2018   Urine Drug Screen:     Component Value Date/Time   LABOPIA NONE DETECTED 02/16/2018 1201    COCAINSCRNUR NONE DETECTED 02/16/2018 1201   LABBENZ NONE DETECTED 02/16/2018 1201   AMPHETMU NONE DETECTED 02/16/2018 1201   THCU NONE DETECTED 02/16/2018 1201   LABBARB NONE DETECTED 02/16/2018 1201    Alcohol Level     Component Value Date/Time   ETH <10 02/15/2018 2328    IMAGING I have personally reviewed the radiological images below and agree with the radiology interpretations.  Ct Head Wo Contrast 02/16/2018 IMPRESSION:  Suggestion of acute infarct in the right periventricular and insular region. Consider MRI correlation if clinically indicated. No acute intracranial hemorrhage or mass effect.   Mr Brain Wo Contrast 02/16/2018 MRI head:  1. Subcentimeter acute/early subacute infarction and right mid corona radiata with few adjacent punctate foci. No associated hemorrhage or mass effect.  2. Mild chronic microvascular ischemic changes and mild parenchymal volume loss of the brain.  3. Small chronic infarcts in right genu of corpus callosum, right  body of corpus callosum, and right hemi pons.  MRA head:  Severe motion artifact. Suboptimal assessment for stenosis or small aneurysm. No large vessel occlusion or large aneurysm identified.   Transthoracic Echocardiogram - Left ventricle: The cavity size was normal. Wall thickness was   normal. Systolic function was normal. The estimated ejection   fraction was in the range of 60% to 65%. Wall motion was normal;   there were no regional wall motion abnormalities. Doppler   parameters are consistent with abnormal left ventricular   relaxation (grade 1 diastolic dysfunction).  Bilateral Carotid Dopplers  02/16/2018 1-39% ICA plaquing. Vertebral artery flow is antegrade.   EEG This normal EEG is recorded in the waking and sleep state. There was no seizure or seizure predisposition recorded on this study. Please note that a normal EEG does not preclude the possibility of epilepsy.   LTM EEG day 1 There was no pushbutton  activations events during this recording.  Automated spike detection program did not detect any spikes. Seizure detection program did not detect any seizures .  Waking background activities were marked by 8 cps posterior dominant developed on the as opposed to right.  There is a persistent right hemispheric delta slowing involving the right frontotemporal region throughout the recording.  There is no interictal epileptiform discharges.  No clinical or subclinical seizures present. Clinical interpretation: This day 1 of intensive EEG monitoring with simultaneous video monitoring did not record any clinical subclinical seizures.  EEG was abnormal due to persistent right frontotemporal delta slowing suggestive of neuronal dysfunction on a structural vascular or degenerative basis.  Clinical correlation is advised.  LTM EEG day 2 Day 2:  numerous events of interest were recorded marked by difficulty using left arm, some tremulousness particular with intention or action or trying to utilize left arm.  Sometimes patients opening and closing his left fist.  Sometimes there is a [name] is trying to move her fingers.  Time there is a more complex left arm movements side to side up and down in a synchronous fashion events appeared to be in waxing and waning fashion sometimes lasting a long time however decreased with rest and increased with again trying to utilize left arm.  At times [name] also complaining of left face been" drawn "" simultaneously with the left arm movements.  With [name] is completely awake alert and aware during these spells.  At times [name] is eating and utilizing left arm during that time accompanied by numerous pushbutton events as [name] experiencing potentially some difficulties in using the left arm. Electrographically was all these numerous events of interest there is unchanged waking background activity is present prior during and after these events of interest. Semiology of these events of  interest are inconsistent with seizures.  In addition left facial drawing and asymmetry have volitional component possibly.  However left arm movements and difficulty associated with utilizing left arm could be related to other etiologies including underlying left arm weakness itself possibly some movement disorder. Background activities were unchanged from previously recorded in marked by right frontotemporal slowing suggestive of neuronal dysfunction in the right frontotemporal region. Clinical interpretation: This day 2 of intensive EEG monitoring with simultaneous video monitoring did not record any clinical subclinical seizures.  EEG was abnormal due to right frontotemporal delta slowing suggestive of neuronal dysfunction on a structural vascular or degenerative basis.   Numerous events of interest were recorded involving left arm and left face as discussed above in details.  There was no  any EEG changes to suggest seizures.  However sometimes with simple partial seizures that there is no significant EEG changes.  However semiology of this events are inconsistent with simple partial seizures.  Again could be related to underlying weakness itself, movement disorder versus some volitional component associated with this events.  Clinical correlation is advised.   PHYSICAL EXAM Temp:  [98.2 F (36.8 C)-98.6 F (37 C)] 98.3 F (36.8 C) (04/12 1151) Pulse Rate:  [85-92] 92 (04/12 1151) Resp:  [16-20] 18 (04/12 1151) BP: (132-180)/(59-89) 168/89 (04/12 1151) SpO2:  [95 %-98 %] 98 % (04/12 1151)  General - Well nourished, well developed, in no apparent distress.  Ophthalmologic - Fundi not visualized due to small pupils.  Cardiovascular - Regular rate and rhythm.  Mental Status -  Level of arousal and orientation to time, place, and person were intact. Language including expression, naming, repetition, comprehension was assessed and found intact. Fund of Knowledge was assessed and was  intact.  Cranial Nerves II - XII - II - Visual field intact OU. III, IV, VI - Extraocular movements intact. V - Facial sensation intact bilaterally. VII - left nasolabial fold mild flattening. VIII - Hearing & vestibular intact bilaterally. X - Palate elevates symmetrically. XI - Chin turning & shoulder shrug intact bilaterally. XII - Tongue protrusion intact.  Motor Strength - The [name]'s strength was normal in all limbs except 4/5 LUE proximal and distally with dexterity difficulty.  Bulk was normal and fasciculations were absent.   Motor Tone - Muscle tone was assessed at the neck and appendages and was normal.  Reflexes - The [name]'s reflexes were 1+ in all extremities and [name] had no pathological reflexes.  Sensory - Light touch, temperature/pinprick were assessed and were symmetrical.    Coordination - The [name] had ataxia on the left arm with FTN testing.  Tremor was absent.  Gait and Station - deferred.  Exam not changed from yesterday.   ASSESSMENT/PLAN [name] is a 67 y.o. female with history of peripheral vascular disease, stroke x2 affecting the left side with no residual deficits, hypertension, diabetes, hyperlipidemia presenting with left-sided weakness, simple partial seizures, and a new headache. [name] did not receive IV t-PA due to late presentation.  Stroke:  subacute right mid corona radiata infarct, likely due to small vessel disease.  Resultant   Mild left facial droop and left arm incoordination   CT head - Suggestion of acute infarct in the right periventricular and insular region.  MRI head - Subcentimeter acute/early subacute infarction at right mid corona radiata with few adjacent punctate foci with multiple old infarcts.  MRA head - motion degraded but otherwise unremarkable  UDS - negative  Carotid Doppler - 1-39% ICA plaquing. Vertebral artery flow is antegrade.   2D Echo - EF 60-65%  LDL - 87  HgbA1c - 8.9  VTE prophylaxis -  [place] heparin Fall precautions Seizure precautions Diet Carb Modified Fluid consistency: Thin; Room service appropriate? Yes  clopidogrel 75 mg daily and Pletal prior to admission, now on clopidogrel 75 mg daily and Pletal. Continue this DAPT on discharge.   [name] will be counseled to be compliant with her antithrombotic medications  Ongoing aggressive stroke risk factor management  Therapy recommendations: CIR  Disposition:  Pending  Left UE intermittent muscle spasm  Episodes of left eye gaze, left head turning, left arm tonic flexion at home  More episodes of left arm shaking with todd's paralysis during hospitalization   No LOC, able to  converse well during episodes  On keppra, increase to 1000mg  bid, now off  On depakote, increase to 750mg  tid, now off  On Vimpat 100mg  bid, now off  depakote level 59 -> 43->56->59->60    Spot EEG normal  Pt continues to report multiple episodes day and night  LTM EEG x 2 days with multiple events but no seizure correlation  Initially concerning for simple partial seizure, but now seems more consistent with LUE spasm  Will d/c all AEDs and put on baclofen  Continue monitoring  Hx of stroke  First one in 2004 and on plavix  01/2011 right CC stroke with left sided weakness, balance issue and slurry speech - residue of walking with hopping on the left side - on plavix and platel  Hypertension  Stable  Long-term BP goal normotensive  Hyperlipidemia  Lipid lowering medication PTA: lopid,  Intolerant to statins  LDL 87, goal < 70  Current lipid lowering medication: lopid  Continue at discharge  Diabetes  HgbA1c 8.9, goal < 7.0  Uncontrolled  Off medication PTA  Hyperglycemia improved  SSI  CBG monitoring  AKI, slowly improving  Cre 1.50->1.46->1.71->1.35->1.36->1.33->1.27->1.33, stable  Encourage po intake  Pt eats well so far  Other Stroke Risk Factors  Advanced age  Former cigarette smoker -  quit 7 years ago.  Obesity, Body mass index is 30.65 kg/m., recommend weight loss, diet and exercise as appropriate   PVD - on plavix and platel  Other Active Problems  Grave's disease s/p iodine ablation - on supplement - TSH  Low but FT4 normal  Hospital day # 6    [name], MD PhD Stroke Neurology 02/22/2018 12:47 PM  To contact Stroke Continuity provider, please refer to [email or web address] After hours, contact General Neurology

## 2018-02-23 ENCOUNTER — Inpatient Hospital Stay (HOSPITAL_COMMUNITY): Payer: Medicaid Other | Admitting: Physical Therapy

## 2018-02-23 ENCOUNTER — Inpatient Hospital Stay (HOSPITAL_COMMUNITY): Payer: Medicaid Other | Admitting: Occupational Therapy

## 2018-02-23 DIAGNOSIS — E1165 Type 2 diabetes mellitus with hyperglycemia: Secondary | ICD-10-CM

## 2018-02-23 DIAGNOSIS — D62 Acute posthemorrhagic anemia: Secondary | ICD-10-CM

## 2018-02-23 DIAGNOSIS — G441 Vascular headache, not elsewhere classified: Secondary | ICD-10-CM

## 2018-02-23 DIAGNOSIS — I10 Essential (primary) hypertension: Secondary | ICD-10-CM

## 2018-02-23 DIAGNOSIS — N183 Chronic kidney disease, stage 3 unspecified: Secondary | ICD-10-CM

## 2018-02-23 LAB — COMPREHENSIVE METABOLIC PANEL
ALT: 27 U/L (ref 14–54)
ANION GAP: 11 (ref 5–15)
AST: 28 U/L (ref 15–41)
Albumin: 3.7 g/dL (ref 3.5–5.0)
Alkaline Phosphatase: 49 U/L (ref 38–126)
BUN: 27 mg/dL — ABNORMAL HIGH (ref 6–20)
CALCIUM: 9.4 mg/dL (ref 8.9–10.3)
CO2: 23 mmol/L (ref 22–32)
CREATININE: 1.57 mg/dL — AB (ref 0.44–1.00)
Chloride: 103 mmol/L (ref 101–111)
GFR, EST AFRICAN AMERICAN: 39 mL/min — AB (ref >60)
GFR, EST NON AFRICAN AMERICAN: 34 mL/min — AB (ref >60)
Glucose, Bld: 185 mg/dL — ABNORMAL HIGH (ref 65–99)
Potassium: 4 mmol/L (ref 3.5–5.1)
Sodium: 137 mmol/L (ref 135–145)
Total Bilirubin: 0.6 mg/dL (ref 0.3–1.2)
Total Protein: 7.7 g/dL (ref 6.5–8.1)

## 2018-02-23 LAB — GLUCOSE, CAPILLARY
GLUCOSE-CAPILLARY: 193 mg/dL — AB (ref 65–99)
GLUCOSE-CAPILLARY: 166 mg/dL — AB (ref 65–99)
Glucose-Capillary: 274 mg/dL — ABNORMAL HIGH (ref 65–99)
Glucose-Capillary: 292 mg/dL — ABNORMAL HIGH (ref 65–99)

## 2018-02-23 LAB — CBC WITH DIFFERENTIAL/PLATELET
Basophils Absolute: 0 10*3/uL (ref 0.0–0.1)
Basophils Relative: 0 %
EOS PCT: 4 %
Eosinophils Absolute: 0.2 10*3/uL (ref 0.0–0.7)
HCT: 31.2 % — ABNORMAL LOW (ref 36.0–46.0)
Hemoglobin: 10.2 g/dL — ABNORMAL LOW (ref 12.0–15.0)
LYMPHS ABS: 2.3 10*3/uL (ref 0.7–4.0)
LYMPHS PCT: 44 %
MCH: 28.8 pg (ref 26.0–34.0)
MCHC: 32.7 g/dL (ref 30.0–36.0)
MCV: 88.1 fL (ref 78.0–100.0)
MONO ABS: 0.5 10*3/uL (ref 0.1–1.0)
MONOS PCT: 9 %
Neutro Abs: 2.2 10*3/uL (ref 1.7–7.7)
Neutrophils Relative %: 43 %
PLATELETS: 264 10*3/uL (ref 150–400)
RBC: 3.54 MIL/uL — ABNORMAL LOW (ref 3.87–5.11)
RDW: 14.5 % (ref 11.5–15.5)
WBC: 5.1 10*3/uL (ref 4.0–10.5)

## 2018-02-23 LAB — CBC
HEMATOCRIT: 33 % — AB (ref 36.0–46.0)
HEMOGLOBIN: 10.8 g/dL — AB (ref 12.0–15.0)
MCH: 28.7 pg (ref 26.0–34.0)
MCHC: 32.7 g/dL (ref 30.0–36.0)
MCV: 87.8 fL (ref 78.0–100.0)
Platelets: 289 10*3/uL (ref 150–400)
RBC: 3.76 MIL/uL — ABNORMAL LOW (ref 3.87–5.11)
RDW: 14.6 % (ref 11.5–15.5)
WBC: 4.9 10*3/uL (ref 4.0–10.5)

## 2018-02-23 NOTE — Plan of Care (Signed)
LTGs established 02/23/18

## 2018-02-23 NOTE — Progress Notes (Signed)
[facility]     PROGRESS NOTE  Subjective/Complaints:  Patient seen lying in beds she did no because she just couldn't get to sleep. Speech is minimally intelligible.  ROS: limited due to cognition  Objective: Vital Signs: Blood pressure 108/64, pulse 82, temperature 98.4 F (36.9 C), temperature source Oral, resp. rate 17, SpO2 97 %. No results found. Recent Labs    02/22/18 0624 02/23/18 0030  WBC 5.1 5.1  HGB 10.4* 10.2*  HCT 31.7* 31.2*  PLT 290 264   Recent Labs    02/22/18 0624 02/22/18 1911  NA 141 138  K 4.0 3.7  CL 107 104  GLUCOSE 164* 227*  BUN 18 22*  CREATININE 1.33* 1.43*  CALCIUM 9.6 9.4   CBG (last 3)  Recent Labs    02/22/18 1612 02/22/18 2125 02/23/18 0647  GLUCAP 271* 204* 193*    Wt Readings from Last 3 Encounters:  02/16/18 76 kg (167 lb 8.8 oz)  02/15/18 74.6 kg (164 lb 6.4 oz)  02/06/18 76.2 kg (168 lb 1.6 oz)    Physical Exam:  BP 108/64 (BP Location: Right Arm)   Pulse 82   Temp 98.4 F (36.9 C) (Oral)   Resp 17   SpO2 97%  Constitutional: She appears well-developed and well-nourished. No distress.  HENT: Normocephalic and atraumatic.  Eyes: EOM are normal. No discharge. Cardiovascular: Normal rate and regular rhythm. No JVD. Respiratory: Effort normal and breath sounds normal.  GI: Bowel sounds are normal. She exhibits no distension.  Musculoskeletal: She exhibits no edema or tenderness.  Neurological: She is alert.  She was able to follow simple one step command.  Speech dysphonic  Motor: LUE/LLE 4+/5 proximal to distal with ataxia (stable) RUE/RLE: 5/5 proximal to distal Skin: Skin is warm and dry. She is not diaphoretic.  Psychiatric: She is slowed.   Assessment/Plan: 1. 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 Physiatrist is providing close team supervision and 24 hour management of active medical problems listed below. Physiatrist and rehab team continue to assess barriers to discharge/monitor patient progress toward functional and medical goals.  Function:  Bathing Bathing position      Bathing parts      Bathing assist        Upper Body Dressing/Undressing Upper body dressing                    Upper body assist        Lower Body Dressing/Undressing Lower body dressing                                  Lower body assist        Toileting Toileting   Toileting steps completed by patient: Performs perineal hygiene Toileting steps completed by helper: Adjust clothing after toileting Toileting Assistive Devices: Other (comment)(BSC)  Toileting assist Assist level: Touching or steadying assistance (Pt.75%)   Transfers Chair/bed transfer   Chair/bed transfer method: Ambulatory Chair/bed transfer assist level: Touching or steadying assistance (Pt > 75%) Chair/bed transfer assistive device: Dentist          Cognition Comprehension Comprehension assist level: Follows complex conversation/direction with no assist  Expression  Expression assist level: Expresses complex ideas: With no assist  Social Interaction Social Interaction assist level: Interacts appropriately with others - No medications needed.  Problem Solving Problem solving assist level: Solves complex problems: Recognizes & self-corrects  Memory Memory assist level: Complete Independence: No helper    Medical Problem List and Plan: 1. Unsteady gait, poor safety with impulsivity secondary to acute/early subacute infarct in right mid corona radiata with few adjacent punctate foci.  Begin CIR 2.  DVT Prophylaxis/Anticoagulation: Pharmaceutical: Lovenox 3. Pain Management: tylenol prn  4. Mood: LCSW to follow for evaluation and support.   5. Neuropsych: This patient is not fully capable of making decisions on her own behalf. 6. Skin/Wound Care: routine pressure relief. 7. Fluids/Electrolytes/Nutrition: Monitor I/Os.  8. T2DM: Hgb 8.9 On 70/30 insulin bid--Monitor BS ac/hs and titrate as indicated   Monitor with increased mobility 9. Muscle spasms: Continue baclofen 10 tid. 10 HTN: Monitor BP bid. On HCTZ and Cozaar daily.    Monitor with increased mobility 11. Hypothyroid: On supplement  12. Headaches: Reports right scalp sensitive to touch. Will consider medications if necessary 13. Acute blood loss anemia  Hemoglobin 10.2 on 4/13  Cont to monitor 14. CKD   Creatinine 1.43 on 4/12  Encourage fluids  Continue to monitor   LOS (Days) 1 A FACE TO FACE EVALUATION WAS PERFORMED  Siah Kannan Lorie Phenix 02/23/2018 7:13 AM

## 2018-02-23 NOTE — Progress Notes (Signed)
PMR Admission Coordinator Pre-Admission Assessment  Patient: [name] is an 67 y.o., female MRN: [id number] DOB: [birth year] Height: 5\' 2"  (157.5 cm) Weight: 76 kg (167 lb 8.8 oz)                                                                                                                                                  Insurance Information HMO: X    PPO:      PCP:      IPA:      80/20:      OTHER:  PRIMARY: [employer]       Policy#: [id number]      Subscriber: Sslf CM Name: Cathie Beams      Phone#: [phone]     Fax#: [phone] Pre-Cert#: [id number]  with faxed weekly updates and discharge plan to case manager     Employer: Retired Benefits:  Phone #: [phone]     Name: Verified online at [email or web address] Eff. Date: 11/13/17     Deduct: $0      Out of Pocket Max: [phone]      Life Max: N/A CIR: $175 a day, days 1-7; $0 a day, days 8+      SNF: $0 a day, days 1-20; $172 a day, days 21-100 Outpatient: 80%     Co-Pay: 20% Home Health: 100%      Co-Pay: $0 DME: 80%     Co-Pay: 20% Providers: In-network   SECONDARY: Medicaid [place] Access      Policy#: [id number]      Subscriber: Self CM Name:       Phone#:      Fax#:  Pre-Cert#: Coverage Code: [id number]      Employer: Retired Benefits:  Phone #: [phone]     Name: Verified via automated system  Eff. 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 She had witnessed seizure with gaze to left and was loaded with dilantin. She was started on Keppra for management of new onset seizures. . MRI/MRA brain done revealing acute/early subacute infarct in right mid corona radiata with few adjacent punctate foci. 2D echo done revealing #F 60-65% with grade 1 diastolic dysfunction. Carotid dopplers were negative for ICA stenosis. She has had intermittent seizure activity (head turn to the left with left arm tonic flexion) therefore Depakote added andKeppra was increased to 1000 mg bidon 04/08. Pulling sensation LUE felt to be due to muscle spasms.Family reported that medications not filled by pharmacy 3 weeks PTA. Dr. Leonie Man felt that stroke due to small vessel disease--to continue Plavix and Pletal. Patient with resultant unsteady gait, poor safety with impulsivity affecting mobility and ADLs. Patient also with questionable seizure activity and Dr. Erlinda Hong had a long-term EEG completed, which revealed no correlation.  As a result, it is likely to be muscle spasms instead of seizures and medications were modified accordingly.  CIR recommended due to persistent functional deficits and patient admitted 02/22/18.   NIH Total: 3  Past Medical History      Past Medical History:  Diagnosis Date  . Anemia   . Arteriosclerotic cardiovascular disease   . Claudication in peripheral vascular disease ([place])   . CVA (cerebral vascular accident) ([place]) 01/2011   affected left side  . Diabetes mellitus 2000  . GERD (gastroesophageal reflux disease)   . Hyperlipidemia   . Hypertension   . Hypothyroidism   . Renal insufficiency   . Vitamin D deficiency     Family History  family history includes Diabetes in her brother, father, mother, and sister; Prostate  cancer in her brother.  Prior Rehab/Hospitalizations:  Has the patient had major surgery during 100 days prior to admission? No  Current Medications   Current Facility-Administered Medications:  .  acetaminophen (TYLENOL) tablet 650 mg, 650 mg, Oral, Q4H PRN, 650 mg at 02/20/18 0303 **OR** [DISCONTINUED] acetaminophen (TYLENOL) solution 650 mg, 650 mg, Per Tube, Q4H PRN **OR** acetaminophen (TYLENOL) suppository 650 mg, 650 mg, Rectal, Q4H PRN, Jule Ser, DO .  baclofen (LIORESAL) tablet 10 mg, 10 mg, Oral, TID, [name], MD .  cilostazol (PLETAL) tablet 100 mg, 100 mg, Oral, BID, Jule Ser, DO, 100 mg at 02/22/18 0836 .  clopidogrel (PLAVIX) tablet 75 mg, 75 mg, Oral, q morning - 10a, Jule Ser, DO, 75 mg at 02/22/18 0836 .  gemfibrozil (LOPID) tablet 600 mg, 600 mg, Oral, BID AC, Skeet Simmer, RPH, 600 mg at 02/22/18 3790 .  heparin injection 5,000 Units, 5,000 Units, Subcutaneous, Q8H, Jule Ser, DO, 5,000 Units at 02/22/18 2409 .  hydrochlorothiazide (HYDRODIURIL) tablet 25 mg, 25 mg, Oral, Daily, Hoffman, Jessica Ratliff, DO .  insulin aspart (novoLOG) injection 0-5 Units, 0-5 Units, Subcutaneous, QHS, Katherine Roan, MD, 2 Units at 02/20/18 2137 .  insulin aspart (novoLOG) injection 0-9 Units, 0-9 Units, Subcutaneous, TID WC, Katherine Roan, MD, 2 Units at 02/22/18 [phone] .  insulin aspart protamine- aspart (NOVOLOG MIX 70/30) injection 30 Units, 30 Units, Subcutaneous, BID WC, Katherine Roan, MD, 30 Units at 02/22/18 [phone] .  levothyroxine (SYNTHROID, LEVOTHROID) tablet 88 mcg, 88 mcg, Oral, QAC breakfast, Jule Ser, DO, 88 mcg at 02/22/18 0836 .  losartan (COZAAR) tablet 100 mg, 100 mg, Oral, Daily, Hoffman, Jessica Ratliff, DO .  senna-docusate (Senokot-S) tablet 1 tablet, 1 tablet, Oral, QHS PRN,  Jule Ser, DO  Patients Current Diet: Fall precautions Seizure precautions Diet Carb Modified Fluid consistency: Thin; Room service  appropriate? Yes  Precautions / Restrictions Precautions Precautions: Fall Restrictions Weight Bearing Restrictions: No   Has the patient had 2 or more falls or a fall with injury in the past year?Yes  Prior Activity Level Community (5-7x/wk): Prior to admission patient was fully independent in her home and in the [place] Devices/Equipment: CBG Meter Home Equipment: None  Prior Device Use: Indicate devices/aids used by the patient prior to current illness, exacerbation or injury? None of the above  Prior Functional Level Prior Function Level of Independence: Independent Comments: history of 4 falls leading to this admission   Self Care: Did the patient need help bathing, dressing, using the toilet or eating? Independent  Indoor Mobility: Did the patient need assistance with walking from room to room (with or without device)? Independent  Stairs: Did the patient need assistance with internal or external stairs (with or without device)? Independent  Functional Cognition: Did the patient need help planning regular tasks such as shopping or remembering to take medications? Independent  Current Functional Level Cognition  Arousal/Alertness: Awake/alert Overall Cognitive Status: Impaired/Different from baseline Current Attention Level: Selective Orientation Level: Oriented to person, Oriented to place, Oriented to situation, Disoriented to time Following Commands: Follows one step commands with increased time, Follows multi-step commands inconsistently Safety/Judgement: Decreased awareness of safety, Decreased awareness of deficits General Comments: patient with less impulsivity today however continues to have difficulty in following multistep commands  Attention: Selective Selective Attention: Impaired Selective Attention Impairment: Verbal basic, Functional basic Memory: Impaired Memory Impairment: Other  (comment)(working memory) Awareness: Impaired Awareness Impairment: Intellectual impairment, Emergent impairment, Anticipatory impairment Problem Solving: Impaired Problem Solving Impairment: Functional basic, Verbal basic Behaviors: Impulsive Safety/Judgment: Impaired    Extremity Assessment (includes Sensation/Coordination)  Upper Extremity Assessment: Defer to OT evaluation  Lower Extremity Assessment: Defer to PT evaluation RLE Deficits / Details: grossly 4+/5 RLE Sensation: WNL RLE Coordination: decreased gross motor LLE Deficits / Details: grossly 4/5 LLE Sensation: WNL LLE Coordination: decreased gross motor    ADLs  Overall ADL's : Needs assistance/impaired Eating/Feeding: Set up, Sitting, Supervision/ safety Grooming: Wash/dry hands, Wash/dry face, Standing, Min guard Upper Body Bathing: Set up, Supervision/ safety, Sitting Lower Body Bathing: Minimal assistance, Cueing for safety, Sit to/from stand Upper Body Dressing : Set up, Supervision/safety, Sitting Lower Body Dressing: Minimal assistance, Cueing for safety, Sit to/from stand Toilet Transfer: Minimal assistance, Min guard, RW, Ambulation, Grab bars, Comfort height toilet Toileting- Clothing Manipulation and Hygiene: Min guard, Sit to/from stand Scientist, research (medical): Minimal assistance, Ambulation, Grab bars, 3 in 1 Functional mobility during ADLs: Minimal assistance, Min guard, Rolling walker, Cueing for safety, Cueing for sequencing General ADL Comments: pt impulsive and required verbal and tactile cues for correct hand placement during ADL mobility and transfers    Mobility  Overal bed mobility: Needs Assistance Bed Mobility: Supine to Sit, Sit to Supine Supine to sit: Min guard Sit to supine: Min guard General bed mobility comments: min guard for safety     Transfers  Overall transfer level: Needs assistance Equipment used: None Transfers: Sit to/from Stand Sit to Stand: Min guard General  transfer comment: Min guard for safety and to assist in steadying     Ambulation / Gait / Stairs / Wheelchair Mobility  Ambulation/Gait Ambulation/Gait assistance: Museum/gallery curator (Feet): 200 Feet Assistive device: None Gait Pattern/deviations: Step-through  pattern, Drifts right/left, Decreased stride length General Gait Details: unable due to still being connected to EEG machine/RN requests that patient stay in room while on machine  Gait velocity: decreased Gait velocity interpretation: Below normal speed for age/gender    Posture / Balance Balance Overall balance assessment: Needs assistance Sitting-balance support: Feet supported, No upper extremity supported Sitting balance-Leahy Scale: Good Standing balance support: No upper extremity supported, Single extremity supported Standing balance-Leahy Scale: Fair Standing balance comment: mild unsteadiness in standing especially with dynamic tasks     Special needs/care consideration BiPAP/CPAP: No, could not tolerate CPAP CPM: No Continuous Drip IV: No Dialysis: No         Life Vest: No Oxygen: No Special Bed: No Trach Size: No Wound Vac (area): No       Skin: WDL                         Bowel mgmt: Continent, 02/22/18 Bladder mgmt: Continent   Diabetic mgmt: HgbA1c: 8.9 - Yes, with CBG check 2 times daily, and insulin      Previous Home Environment Living Arrangements: Alone  Lives With: Alone Available Help at Discharge: Friend(s), Available PRN/intermittently Type of Home: Apartment Home Layout: One level Home Access: Stairs to enter Entrance Stairs-Rails: Can reach both, Right, Left Entrance Stairs-Number of Steps: 5 Bathroom Shower/Tub: Chiropodist: Standard Home Care Services: No  Discharge Living Setting Plans for Discharge Living Setting: Patient's home, Alone Type of Home at Discharge: Apartment Discharge Home Layout: One level Discharge Home Access: Stairs to  enter Entrance Stairs-Rails: Can reach both Entrance Stairs-Number of Steps: 5 Discharge Bathroom Shower/Tub: Tub/shower unit, Curtain Discharge Bathroom Toilet: Standard Discharge Bathroom Accessibility: Yes How Accessible: Accessible via walker Does the patient have any problems obtaining your medications?: No  Social/Family/Support Systems Patient Roles: Parent Contact Information: Daughter: [name]  Anticipated Caregiver: None, Mod I goals  Ability/Limitations of Caregiver: Daughter works full time  Careers adviser: Intermittent Discharge Plan Discussed with Primary Caregiver: Yes Is Caregiver In Agreement with Plan?: (Patient in agreement with plan ) Does Caregiver/Family have Issues with Lodging/Transportation while Pt is in Rehab?: No  Goals/Additional Needs Patient/Family Goal for Rehab: PT/OT : Mod I  Expected length of stay: 7-9 days  Cultural Considerations: None Dietary Needs: Heart healthy, carb. mod. diet restrictions  Equipment Needs: TBD Pt/Family Agrees to Admission and willing to participate: Yes Program Orientation Provided & Reviewed with Pt/Caregiver Including Roles  & Responsibilities: Yes  Decrease burden of Care through IP rehab admission: No  Possible need for SNF placement upon discharge: Not anticipated   Patient Condition: This patient's medical and functional status has changed since the consult dated: 02/18/18 in which the Rehabilitation Physician determined and documented that the patient's condition is appropriate for intensive rehabilitative care in an inpatient rehabilitation facility. See "History of Present Illness" (above) for medical update. Functional changes are: Min A for static standing and Mod A for dynamic standing with therapy this afternoon (given verbally by OT). Patient's medical and functional status update has been discussed with the Rehabilitation physician and patient remains appropriate for inpatient rehabilitation. Will  admit to inpatient rehab today.  Preadmission Screen Completed By:  Gunnar Fusi, 02/22/2018 3:28 PM ______________________________________________________________________   Discussed status with Dr. Posey Pronto on 02/22/18 at 1530 and received telephone approval for admission today.  Admission Coordinator:  Gunnar Fusi, time 1530/Date 02/22/18             Cosigned  by: Jamse Arn, MD at 02/22/2018 4:10 PM  Revision History

## 2018-02-23 NOTE — Evaluation (Addendum)
Speech Language Pathology Assessment and Plan  Patient Details  Name: [name] MRN: [id number] Date of Birth: [birth year]  SLP Diagnosis: Cognitive Impairments  Rehab Potential: Good ELOS: 9-14 days     Today's Date: 02/23/2018 SLP Individual Time: 3557-3220 / 2542-7062 SLP Individual Time Calculation (min): 38 min / 28 min   Problem List:  Patient Active Problem List   Diagnosis Date Noted  . Stage 3 chronic kidney disease ([place])   . Acute blood loss anemia   . Essential hypertension   . Poorly controlled type 2 diabetes mellitus ([place])   . 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 Muscle spasm   . Vascular headache   . Seizure ([place])   . CVA (cerebral vascular accident) ([place]) 02/16/2018  . Left arm weakness 02/07/2018  . Gingivitis 10/24/2017  . Dysphagia 01/16/2017  . GERD (gastroesophageal reflux disease) 06/01/2015  . Normocytic anemia 06/01/2015  . Healthcare maintenance 06/01/2015  . Diastolic dysfunction 37/62/8315  . Vitamin D deficiency 06/01/2015  . CKD (chronic kidney disease), stage III ([place]) 03/01/2014  . UTI (urinary tract infection) 03/01/2014  . PVD (peripheral vascular disease) ([place]) 06/18/2013  . Hypothyroidism 01/10/2007  . Hyperlipidemia 01/10/2007  . History of tobacco use 01/10/2007  . HYPERTENSION, BENIGN SYSTEMIC 01/10/2007  . History of CVA (cerebrovascular accident) 01/10/2007  . Diabetes mellitus with stage 3 chronic kidney disease ([place]) 01/10/1989   Past Medical History:  Past Medical History:  Diagnosis Date  . Anemia   . Arteriosclerotic cardiovascular disease   . Claudication in peripheral vascular disease ([place])   . CVA (cerebral vascular accident) ([place]) 01/2011   affected left side  . Diabetes mellitus 2000  . GERD (gastroesophageal reflux disease)   . Hyperlipidemia   . Hypertension   . Hypothyroidism   . Renal insufficiency   . Vitamin D deficiency    Past Surgical History:  Past  Surgical History:  Procedure Laterality Date  . CESAREAN SECTION    . LEFT HEART CATHETERIZATION WITH CORONARY ANGIOGRAM N/A 03/08/2012   Procedure: LEFT HEART CATHETERIZATION WITH CORONARY ANGIOGRAM;  Surgeon: Sinclair Grooms, MD;  Location: [facility] CATH LAB;  Service: Cardiovascular;  Laterality: N/A;  . LEFT HEART CATHETERIZATION WITH CORONARY ANGIOGRAM N/A 12/17/2012   Procedure: LEFT HEART CATHETERIZATION WITH CORONARY ANGIOGRAM;  Surgeon: Laverda Page, MD;  Location: [facility] CATH LAB;  Service: Cardiovascular;  Laterality: N/A;  . LOWER EXTREMITY ANGIOGRAM N/A 12/17/2012   Procedure: LOWER EXTREMITY ANGIOGRAM;  Surgeon: Laverda Page, MD;  Location[facility] C[facility] LAB;  Service: Cardiovascular;  Laterality: N/A;  . TUBAL LIGATION      Assessment / Plan / Recommendation Clinical Impression [name] is a 67 year old female with history of T2DM, GERD, PVD, CVA X 2 who was admitted on 02/16/18 with one week history of intermittent left sided weakness as well as multiple episodes of generalized seizures. History taken from chart review and patient. She had witnessed seizure with gaze to left and was loaded with dilantin. She was started on Keppra for management of new onset seizures. [facility] Kitchen MRI brain reviewed, showing right basal ganglia infarct.  Per report MRI/MRA brain done revealing acute/early subacute infarct in right mid corona radiata with few adjacent punctate foci.  2D echo done revealing #F 60-65% with grade 1 diastolic dysfunction.  Carotid dopplers were negative for ICA stenosis.  Family reported that medications not filled by pharmacy 3 weeks PTA. Dr. Leonie Man felt that stroke due to  small vessel disease--to continue Plavix and Pletal. She has had intermittent seizure activity (head turn to the left with left arm tonic flexion) therefore Depakote added and Keppra was increased to 1000 mg bid on 04/08. Pulling sensation LUE felt to be due to muscle spasms. But continued to have seizure type activity  therefore was placed on long term monitor. She continued to have symptoms but no seizure activity noted and Dr. Erlinda Hong felt that symptoms due to muscle spasms, weakness versus some volitional component.  AEDs were discontinued and she was started on baclofen to help manage symptoms.  Patient with resultant unsteady gait, poor safety with impulsivity affecting mobility and ADLs. CIR recommended due to functional deficits.    Pt admitted to CIR on 02/22/2018 and evaluated for cognitive lingutic and swallow skills on 02/23/2018/ Pt presents with moderate cognitive impairments, in areas of awareness, recall/working memory, sustained attention in mildly complex task, and basic-mildly complex problem solving, which was further supported by a score of 16 out 30 on Moca version 72. (n=>26) with one additional point for education. Pt presents during functional ADLs and in conversation with reduced impairments then what was noted on structured assessment. Pt demonstrates no speech deficits and WFL oral motor/swallow function with regular textured foods and thin liquids via cup/straw. Pt would benefit from skilled ST services in order to maximize functional independence and reduce burden of care prior to discharge with likely follow up skilled ST services.    Skilled Therapeutic Interventions          Skilled ST services focused on cognitive skills. SLP facilitated sustained attention during mildly complex tasks of MOCA assessment requiring min A verbal cues for redirection in 15 minute interval perseverating occasional on pervious task. SLP reviewed assessment results and pt agreed to deficits, in which she previously stated there was none. Pt required mod A verbal cues for safety awareness when given basic question prompts pertaining to safety in facility and utilizing call bell to get out of bed. Pt was left in room with call bell within reach. Reccomend to continue skilled ST services   SLP Assessment  Patient will need  skilled [facility] Services during CIR admission    Recommendations  SLP Diet Recommendations: Thin Liquid Administration via: Straw;Cup Medication Administration: Whole meds with liquid Supervision: Patient able to self feed Compensations: Minimize environmental distractions;Slow rate;Small sips/bites Postural Changes and/or Swallow Maneuvers: Seated upright 90 degrees Oral Care Recommendations: Oral care BID Patient destination: Home Follow up Recommendations: Outpatient SLP;Home Health SLP;24 hour supervision/assistance Equipment Recommended: None recommended by SLP    SLP Frequency 3 to 5 out of 7 days   SLP Duration  SLP Intensity  SLP Treatment/Interventions 9-14 days   Minumum of 1-2 x/day, 30 to 90 minutes  Cognitive remediation/compensation;Cueing hierarchy;Functional tasks    Pain Pain Assessment Pain Score: 0-No pain  Prior Functioning Cognitive/Linguistic Baseline: Within functional limits Type of Home: House  Lives With: Daughter(D/c home with dtr) Available Help at Discharge: Family;Available PRN/intermittently Education: GED Vocation: On disability  Function:  Eating Eating   Modified Consistency Diet: Yes Eating Assist Level: Set up assist for   Eating Set Up Assist For: Opening containers       Cognition Comprehension Comprehension assist level: Follows basic conversation/direction with no assist  Expression   Expression assist level: Expresses basic needs/ideas: With no assist  Social Interaction Social Interaction assist level: Interacts appropriately 90% of the time - Needs monitoring or encouragement for participation or interaction.  Problem  Solving Problem solving assist level: Solves basic problems with no assist  Memory Memory assist level: Recognizes or recalls 50 - 74% of the time/requires cueing 25 - 49% of the time   Short Term Goals: Week 1: SLP Short Term Goal 1 (Week 1): Pt will demonstrate sustained attention in  functional task for 20 minutes with supervision A verbal cues for redirection. SLP Short Term Goal 2 (Week 1): Pt will recall novel, daily information with min A verbal cues for use of external aid. SLP Short Term Goal 3 (Week 1): Pt will demonstrate functional problem solving for basic and familiar (mildly complex) tasks eith min A verbal cues. SLP Short Term Goal 4 (Week 1): Pt will self-monitor functional and verbal errors in functional tasks with mod A verbal cues. SLP Short Term Goal 5 (Week 1): Pt will demonstrate safety awareness due to CVA deficits in verbal and functional tasks with min A verbal cues.  Refer to Care Plan for Long Term Goals  Recommendations for other services: None   Discharge Criteria: Patient will be discharged from SLP if patient refuses treatment 3 consecutive times without medical reason, if treatment goals not met, if there is a change in medical status, if patient makes no progress towards goals or if patient is discharged from hospital.  The above assessment, treatment plan, treatment alternatives and goals were discussed and mutually agreed upon: by patient  Makyla Bye  [facility] 02/23/2018, 5:00 PM

## 2018-02-23 NOTE — Evaluation (Signed)
Physical Therapy Assessment and Plan  Patient Details  Name: [name] MRN: [id number] Date of Birth: [birth year]  PT Diagnosis: Abnormal posture, Abnormality of gait and Hemiplegia non-dominant Rehab Potential: Good ELOS: 7-10    Today's Date: 02/23/2018 PT Individual Time: 0945-1100 PT Individual Time Calculation (min): 75 min    Problem List:  Patient Active Problem List   Diagnosis Date Noted  . Stage 3 chronic kidney disease ([place])   . Acute blood loss anemia   . Essential hypertension   . Poorly controlled type 2 diabetes mellitus ([place])   . 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 ([place]) 02/22/2018  . Muscle spasm   . Vascular headache   . Seizure ([place])   . CVA (cerebral vascular accident) ([place]) 02/16/2018  . Left arm weakness 02/07/2018  . Gingivitis 10/24/2017  . Dysphagia 01/16/2017  . GERD (gastroesophageal reflux disease) 06/01/2015  . Normocytic anemia 06/01/2015  . Healthcare maintenance 06/01/2015  . Diastolic dysfunction 19/14/7829  . Vitamin D deficiency 06/01/2015  . CKD (chronic kidney disease), stage III ([place]) 03/01/2014  . UTI (urinary tract infection) 03/01/2014  . PVD (peripheral vascular disease) ([place]) 06/18/2013  . Hypothyroidism 01/10/2007  . Hyperlipidemia 01/10/2007  . History of tobacco use 01/10/2007  . HYPERTENSION, BENIGN SYSTEMIC 01/10/2007  . History of CVA (cerebrovascular accident) 01/10/2007  . Diabetes mellitus with stage 3 chronic kidney disease ([place]) 01/10/1989    Past Medical History:  Past Medical History:  Diagnosis Date  . Anemia   . Arteriosclerotic cardiovascular disease   . Claudication in peripheral vascular disease ([place])   . CVA (cerebral vascular accident) ([place]) 01/2011   affected left side  . Diabetes mellitus 2000  . GERD (gastroesophageal reflux disease)   . Hyperlipidemia   . Hypertension   . Hypothyroidism   . Renal insufficiency   . Vitamin D deficiency    Past Surgical History:  Past  Surgical History:  Procedure Laterality Date  . CESAREAN SECTION    . LEFT HEART CATHETERIZATION WITH CORONARY ANGIOGRAM N/A 03/08/2012   Procedure: LEFT HEART CATHETERIZATION WITH CORONARY ANGIOGRAM;  Surgeon: Sinclair Grooms, MD;  Location[facility] LAB;  Service: Cardiovascular;  Laterality: N/A;  . LEFT HEART CATHETERIZATION WITH CORONARY ANGIOGRAM N/A 12/17/2012   Procedure: LEFT HEART CATHETERIZATION WITH CORONARY ANGIOGRAM;  Surgeon: Laverda Page, MD;  Location: [facility] CATH LAB;  Service: Cardiovascular;  Laterality: N/A;  . LOWER EXTREMITY ANGIOGRAM N/A 12/17/2012   Procedure: LOWER EXTREMITY ANGIOGRAM;  Surgeon: Laverda Page, MD;  Location: [facility] CATH LAB;  Service: Cardiovascular;  Laterality: N/A;  . TUBAL LIGATION      Assessment & Plan Clinical Impression: Patient is a 67 year old female with history of T2DM, GERD, PVD, CVA X 2 who was admitted on 02/16/18 with one week history of intermittent left sided weakness as well as multiple episodes of generalized seizures. History taken from chart review and patient. She had witnessed seizure with gaze to left and was loaded with dilantin. She was started on Keppra for management of new onset seizures. Marland Kitchen MRI brain reviewed, showing right basal ganglia infarct.  Per report MRI/MRA brain done revealing acute/early subacute infarct in right mid corona radiata with few adjacent punctate foci.  2D echo done revealing #F 60-65% with grade 1 diastolic dysfunction.  Carotid dopplers were negative for ICA stenosis.  Family reported that medications not filled by pharmacy 3 weeks PTA. Dr. Leonie Man felt that stroke due to small vessel disease--to continue  Plavix and Pletal.  She has had intermittent seizure activity (head turn to the left with left arm tonic flexion) therefore Depakote added and Keppra was increased to 1000 mg bid on 04/08. Pulling sensation LUE felt to be due to muscle spasms. But continued to have seizure type activity therefore was placed  on long term monitor. She continued to have symptoms but no seizure activity noted and Dr. Erlinda Hong felt that symptoms due to muscle spasms, weakness versus some volitional component.  AEDs were discontinued and she was started on baclofen to help manage symptoms.   Patient with resultant unsteady gait, poor safety with impulsivity affecting mobility and ADLs.    Patient transferred to CIR on 02/22/2018 .   Patient currently requires min with mobility secondary to muscle weakness, impaired timing and sequencing, unbalanced muscle activation and decreased coordination and decreased standing balance, decreased postural control, hemiplegia and decreased balance strategies.  Prior to hospitalization, patient was independent  with mobility and lived with Alone in a [place] home.  Home access is 5Stairs to enter.  Patient will benefit from skilled PT intervention to maximize safe functional mobility, minimize fall risk and decrease caregiver burden for planned discharge home with intermittent assist.  Anticipate patient will benefit from follow up [facility] at discharge.  PT - End of Session Activity Tolerance: Tolerates 30+ min activity with multiple rests Endurance Deficit: Yes PT Assessment Rehab Potential (ACUTE/IP ONLY): Good PT Barriers to Discharge: Inaccessible home environment;Decreased caregiver support;Home environment access/layout PT Patient demonstrates impairments in the following area(s): Balance;Behavior;Endurance;Motor;Perception;Safety;Skin Integrity PT Transfers Functional Problem(s): Bed Mobility;Bed to Chair;Car;Furniture;Floor PT Locomotion Functional Problem(s): Ambulation;Wheelchair Mobility;Stairs PT Plan PT Intensity: Minimum of 1-2 x/day ,45 to 90 minutes PT Frequency: 5 out of 7 days PT Duration Estimated Length of Stay: 7-10  PT Treatment/Interventions: Ambulation/gait training;Balance/vestibular training;Cognitive remediation/compensation;Disease management/prevention;Community  reintegration;Discharge planning;DME/adaptive equipment instruction;Functional mobility training;Patient/family education;Neuromuscular re-education;Psychosocial support;Pain management;Skin care/wound management;Functional electrical stimulation;Splinting/orthotics;Therapeutic Exercise;Therapeutic Activities;Stair training;UE/LE Strength taining/ROM;Wheelchair propulsion/positioning;UE/LE Coordination activities;Visual/perceptual remediation/compensation PT Transfers Anticipated Outcome(s): Mod I with LRAD  PT Locomotion Anticipated Outcome(s): Mod I with LRAD  PT Recommendation Follow Up Recommendations: Home health PT Patient destination: Home Equipment Recommended: To be determined;Rolling walker with 5" wheels  Skilled Therapeutic Intervention Pt received supine in bed and agreeable to PT. Supine>sit transfer without cues or assist. PT instructed patient in PT Evaluation and initiated treatment intervention; see below for details. PT educated patient in [place], rehab potential, rehab goals, and discharge recommendations. Pt returned to room and performed stand pivot transfer with supervision assist. Sit>supine completed without assist, and left supine in bed with call bell in reach and all needs met.       PT Evaluation Precautions/Restrictions   fall.  General   Vital Signs  Pain   denies  Home Living/Prior Functioning Home Living Available Help at Discharge: Friend(s);Available PRN/intermittently Type of Home: Apartment Home Access: Stairs to enter Entrance Stairs-Number of Steps: 5 Entrance Stairs-Rails: Can reach both;Right;Left Home Layout: One level Bathroom Shower/Tub: Chiropodist: Standard Bathroom Accessibility: Yes  Lives With: Alone Prior Function Level of Independence: Independent with homemaking with ambulation;Independent with basic ADLs;Independent with gait  Able to Take Stairs?: Yes Driving: Yes Vocation: On disability Comments: pt reports  fall last thursday with CVA) Vision/Perception  Perception Perception: Within Functional Limits Praxis Praxis: Intact  Cognition Orientation Level: Oriented X4 Sensation Sensation Light Touch: Appears Intact Proprioception: Appears Intact Coordination Gross Motor Movements are Fluid and Coordinated: No Fine Motor Movements are Fluid and Coordinated: No Coordination  and Movement Description: mild dysmetria in the LUE  Finger Nose Finger Test: mild dysmetria in the LUE with decreased accuracy and undershooting.  Heel Shin Test: [facility] BLE  Motor  Motor Motor: Hemiplegia Motor - Skilled Clinical Observations: mild hemiplegia on the L. UE>LE.   Mobility Bed Mobility Bed Mobility: Rolling Right;Rolling Left;Supine to Sit;Sit to Supine Rolling Right: 6: Modified independent (Device/Increase time) Rolling Left: 6: Modified independent (Device/Increase time) Supine to Sit: 6: Modified independent (Device/Increase time) Sit to Supine: 6: Modified independent (Device/Increase time) Transfers Transfers: Yes Sit to Stand: 5: Supervision Sit to Stand Details: Verbal cues for precautions/safety Stand Pivot Transfers: 5: Supervision Stand Pivot Transfer Details: Verbal cues for precautions/safety Stand Pivot Transfer Details (indicate cue type and reason): Car transfer with Supervision assist.  Locomotion  Ambulation Ambulation: Yes Ambulation/Gait Assistance: 4: Min assist Ambulation Distance (Feet): 210 Feet Assistive device: None Gait Gait: Yes Gait Pattern: Impaired Gait Pattern: Wide base of support;Lateral trunk lean to left Stairs / Additional Locomotion Stairs: Yes Stairs Assistance: 4: Min assist Stairs Assistance Details: Verbal cues for gait pattern;Verbal cues for safe use of DME/AE Stair Management Technique: Two rails Number of Stairs: 12 Height of Stairs: 6 Wheelchair Mobility Wheelchair Mobility: Yes Wheelchair Assistance: 5: Investment banker, operational  Details: Verbal cues for Marketing executive: Both upper extremities Distance: 127f  Trunk/Postural Assessment  Cervical Assessment Cervical Assessment: Within FWater engineerThoracic Assessment: Within Functional Limits Lumbar Assessment Lumbar Assessment: Within Functional Limits Postural Control Postural Control: Deficits on evaluation Postural Limitations: mild impairments with correcting LOB   Balance Balance Balance Assessed: Yes Standardized Balance Assessment Standardized Balance Assessment: Berg Balance Test Berg Balance Test Sit to Stand: Able to stand without using hands and stabilize independently Standing Unsupported: Able to stand safely 2 minutes Sitting with Back Unsupported but Feet Supported on Floor or Stool: Able to sit safely and securely 2 minutes Stand to Sit: Sits safely with minimal use of hands Transfers: Able to transfer safely, minor use of hands Standing Unsupported with Eyes Closed: Able to stand 10 seconds safely Standing Ubsupported with Feet Together: Able to place feet together independently but unable to hold for 30 seconds From Standing, Reach Forward with Outstretched Arm: Can reach forward >12 cm safely (5") From Standing Position, Pick up Object from Floor: Able to pick up shoe safely and easily From Standing Position, Turn to Look Behind Over each Shoulder: Turn sideways only but maintains balance Turn 360 Degrees: Able to turn 360 degrees safely but slowly Standing Unsupported, Alternately Place Feet on Step/Stool: Able to complete >2 steps/needs minimal assist Standing Unsupported, One Foot in Front: Able to plae foot ahead of the other independently and hold 30 seconds Standing on One Leg: Tries to lift leg/unable to hold 3 seconds but remains standing independently Total Score: 42 Static Sitting Balance Static Sitting - Level of Assistance: 6: Modified independent (Device/Increase time) Dynamic Sitting  Balance Dynamic Sitting - Level of Assistance: 6: Modified independent (Device/Increase time) Static Standing Balance Static Standing - Level of Assistance: 5: Stand by assistance Dynamic Standing Balance Dynamic Standing - Level of Assistance: 4: Min assist Extremity Assessment      RLE Assessment RLE Assessment: Within Functional Limits LLE Assessment LLE Assessment: Exceptions to [facility]Signature Psychiatric HospitalLLE Strength LLE Overall Strength Comments: hip flexion, hip abduction and hip adduction: 4/5, knee extension 5/5. knee flexion 4/5. ankle DF 4/5.    See Function Navigator for Current Functional Status.   Refer to Care Plan for Long  Term Goals  Recommendations for other services: Therapeutic Recreation  Kitchen group and Outing/community reintegration  Discharge Criteria: Patient will be discharged from PT if patient refuses treatment 3 consecutive times without medical reason, if treatment goals not met, if there is a change in medical status, if patient makes no progress towards goals or if patient is discharged from hospital.  The above assessment, treatment plan, treatment alternatives and goals were discussed and mutually agreed upon: by patient  Lorie Phenix 02/23/2018, 11:02 AM

## 2018-02-23 NOTE — Progress Notes (Signed)
Physical Medicine and Rehabilitation Consult Reason for Consult: Left side weakness and slurred speech Referring Physician: [facility]   HPI: [name] is a 67 y.o. right-handed female with history of hypertension, remote tobacco abuse, diabetes mellitus, CKD stage III as well as prior CVA x2 maintained on Plavix with little residual weakness.  Per chart review patient lives alone and independent still driving.  One level apartment with 3 steps to entry.  She has a daughter in the area that works. Presented 02/16/2018 with left-sided weakness, headache question seizure and noted falls.  Patient initially came to the emergency room 02/10/2018 for headache and reported left-sided weakness however she refused to stay for any further medical workup due to long wait times and left without being seen by MD.  Cranial CT 02/16/2018 scan showed suggestion of acute infarction in the right periventricular and insular region.  No acute intracranial hemorrhage.  UDS negative.  Patient did not receive TPA.    EEG negative for seizure.  MRI showed subcentimeter acute early subacute infarction in right mid corona radiata with a few adjacent punctate foci.  Small chronic infarcts in the right genu of corpus callosum, right body of corpus callosum and right hemi-pons.  Echocardiogram with ejection fraction of 44% grade 1 diastolic dysfunction.  Neurology follow-up currently maintained on Depakote/Keppra for question seizure and workup currently ongoing.  Currently remains on Plavix for CVA prophylaxis.  Subcutaneous heparin for DVT prophylaxis.  Tolerating a regular diet.  Physical therapy evaluation completed with recommendations of physical medicine rehab consult.  Review of Systems  Constitutional: Negative for chills and fever.  HENT: Negative for hearing loss.   Eyes: Negative for blurred vision and double vision.  Respiratory: Negative for cough and shortness of breath.   Cardiovascular: Negative for chest pain,  palpitations and leg swelling.  Gastrointestinal: Positive for constipation. Negative for nausea and vomiting.       GERD  Genitourinary: Negative for dysuria, flank pain and hematuria.  Musculoskeletal: Positive for myalgias.  Neurological: Positive for speech change, focal weakness and weakness.  All other systems reviewed and are negative.      Past Medical History:  Diagnosis Date  . Anemia   . Arteriosclerotic cardiovascular disease   . Claudication in peripheral vascular disease ([place])   . CVA (cerebral vascular accident) ([place]) 01/2011   affected left side  . Diabetes mellitus 2000  . GERD (gastroesophageal reflux disease)   . Hyperlipidemia   . Hypertension   . Hypothyroidism   . Renal insufficiency   . Vitamin D deficiency         Past Surgical History:  Procedure Laterality Date  . CESAREAN SECTION    . LEFT HEART CATHETERIZATION WITH CORONARY ANGIOGRAM N/A 03/08/2012   Procedure: LEFT HEART CATHETERIZATION WITH CORONARY ANGIOGRAM;  Surgeon: Sinclair Grooms, MD;  Location: [facility] CATH LAB;  Service: Cardiovascular;  Laterality: N/A;  . LEFT HEART CATHETERIZATION WITH CORONARY ANGIOGRAM N/A 12/17/2012   Procedure: LEFT HEART CATHETERIZATION WITH CORONARY ANGIOGRAM;  Surgeon: Laverda Page, MD;  Location: [facility] CATH LAB;  Service: Cardiovascular;  Laterality: N/A;  . LOWER EXTREMITY ANGIOGRAM N/A 12/17/2012   Procedure: LOWER EXTREMITY ANGIOGRAM;  Surgeon: Laverda Page, MD;  Location: [facility] CATH LAB;  Service: Cardiovascular;  Laterality: N/A;  . TUBAL LIGATION          Family History  Problem Relation Age of Onset  . Diabetes Brother   . Prostate cancer Brother   . Diabetes Mother   .  Diabetes Father   . Diabetes Sister   . Esophageal cancer Neg Hx   . Rectal cancer Neg Hx   . Stomach cancer Neg Hx   . Colon cancer Neg Hx   . Breast cancer Neg Hx    Social History:  reports that she quit smoking about 7 years ago. She has never used  smokeless tobacco. She reports that she does not drink alcohol or use drugs. Allergies:       Allergies  Allergen Reactions  . Insulins Rash    Humalog 75/25 only  . Statins     "sick to my stomach"         Medications Prior to Admission  Medication Sig Dispense Refill  . cilostazol (PLETAL) 100 MG tablet Take 100 mg by mouth 2 (two) times daily.    . clopidogrel (PLAVIX) 75 MG tablet TAKE 1 TABLET BY MOUTH EVERY MORNING 90 tablet 3  . gemfibrozil (LOPID) 600 MG tablet TAKE 1 TABLET BY MOUTH TWICE A DAY 60 tablet 5  . hydrochlorothiazide (HYDRODIURIL) 25 MG tablet Take 1 tablet (25 mg total) by mouth daily. 90 tablet 3  . Insulin Isophane & Regular Human (NOVOLIN 70/30 FLEXPEN) (70-30) 100 UNIT/ML PEN Inject 36 Units into the skin daily. 15 mL 8  . levothyroxine (SYNTHROID, LEVOTHROID) 88 MCG tablet TAKE 1 TABLET BY MOUTH EVERY DAY BEFORE BREAKFAST 90 tablet 3  . liraglutide 18 MG/3ML SOPN Inject 0.2 mLs (1.2 mg total) into the skin daily. 6 mL 3  . losartan (COZAAR) 100 MG tablet TAKE 1 TABLET BY MOUTH EVERY DAY 90 tablet 3  . Multiple Vitamins-Minerals (MULTIVITAMIN ADULT PO) Take 1 tablet by mouth once a week. On Wednesday    . BD INSULIN SYRINGE ULTRAFINE 31G X 15/64" 0.5 ML MISC USE TO INJECT INSULIN TWO TIMES A DAY 100 each 11  . glucose blood (ACCU-CHEK AVIVA PLUS) test strip Use to check blood sugars twice a day.Dx Code: E11.22. 100 each 4  . Insulin Pen Needle (B-D UF III MINI PEN NEEDLES) 31G X 5 MM MISC The patient is insulin requiring, ICD 10 code E11.9. The patient injects insulin 2 times per day. 100 each 2  . ranitidine (ZANTAC) 150 MG tablet TAKE 1 TABLET BY MOUTH EVERY DAY AS NEEDED (Patient not taking: Reported on 02/16/2018) 30 tablet 4  . sulfamethoxazole-trimethoprim (BACTRIM DS,SEPTRA DS) 800-160 MG tablet Take 1 tablet by mouth 2 (two) times daily for 3 days. 6 tablet 0    Home: Home Living Family/patient expects to be discharged to:: Private  residence Living Arrangements: Alone Available Help at Discharge: Friend(s), Available PRN/intermittently Type of Home: Apartment Home Access: Stairs to enter [employer] of Steps: 5(3 steps in front with no rails) Entrance Stairs-Rails: Can reach both, Right, Left Home Layout: One level Bathroom Shower/Tub: Chiropodist: Standard Home Equipment: None  Lives With: Alone  Functional History: Prior Function Level of Independence: Independent Comments: history of 4 falls (pt reports thursday was most recent fall) Functional Status:  Mobility: Bed Mobility Overal bed mobility: Needs Assistance Bed Mobility: Supine to Sit Supine to sit: Min guard General bed mobility comments: min guard for safety with use of bed rail Transfers Overall transfer level: Needs assistance Equipment used: None Transfers: Sit to/from Stand Sit to Stand: Min assist General transfer comment: min assist for safety and balance. pt unsteady and reaching for IV pole to steady herself Ambulation/Gait Ambulation/Gait assistance: Min assist Ambulation Distance (Feet): 80 Feet Assistive device: None  Gait Pattern/deviations: Step-through pattern, Decreased stride length, Drifts right/left General Gait Details: pt with unsteady gait requring close min guard to min assist for balance. 2 episodes of LOB noted requiring min assist to prevent fall. righting reactions present but delayed. notes pt is especially unsteady with multitasking (talking) and turning Gait velocity: decreased Gait velocity interpretation: Below normal speed for age/gender  ADL:  Cognition: Cognition Overall Cognitive Status: Impaired/Different from baseline Arousal/Alertness: Awake/alert Orientation Level: Oriented X4 Attention: Selective Selective Attention: Impaired Selective Attention Impairment: Verbal basic, Functional basic Memory: Impaired Memory Impairment: Other (comment)(working  memory) Awareness: Impaired Awareness Impairment: Intellectual impairment, Emergent impairment, Anticipatory impairment Problem Solving: Impaired Problem Solving Impairment: Functional basic, Verbal basic Behaviors: Impulsive Safety/Judgment: Impaired Cognition Arousal/Alertness: Awake/alert Behavior During Therapy: Impulsive Overall Cognitive Status: Impaired/Different from baseline General Comments: pt had to be told multiple times not to get out of bed before PT was ready. may have mildly slow processing with slight delay in command following.  Blood pressure 134/62, pulse 88, temperature 98.9 F (37.2 C), temperature source Oral, resp. rate 18, height 5\' 2"  (1.575 m), weight 76 kg (167 lb 8.8 oz), SpO2 94 %. Physical Exam  Vitals reviewed. Constitutional: She is oriented to person, place, and time.  Eyes: EOM are normal. Right eye exhibits no discharge. Left eye exhibits no discharge.  Neck: Normal range of motion. Neck supple. No thyromegaly present.  Cardiovascular: Normal rate, regular rhythm and normal heart sounds.  Respiratory: Effort normal and breath sounds normal. No respiratory distress.  GI: Soft. Bowel sounds are normal. She exhibits no distension.  Neurological: She is alert and oriented to person, place, and time.  Fair awareness of deficits. Followed commands. Normal language. Functional swallow. LUE 4/5 prox to distal. LLE: 3+HF, 4- KE and 4/5 ADF/PF. Decreased FTN, +left PD. Sensory function appears intact.   Skin: Skin is warm and dry.  Psychiatric: She has a normal mood and affect. 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 Does the need for close, 24 hr/day medical supervision in concert with the patient's rehab needs make it unreasonable for this patient to be served in a less intensive setting? Yes 2. Co-Morbidities requiring supervision/potential complications: CKD III, DM3, HTN, PVD 3. Due to  bladder management, bowel management, safety, skin/wound care, disease management, medication administration, pain management and patient education, does the patient require 24 hr/day rehab nursing? Yes 4. Does the patient require coordinated care of a physician, rehab nurse, PT (1-2 hrs/day, 5 days/week) and OT (1-2 hrs/day, 5 days/week) to address physical and functional deficits in the context of the above medical diagnosis(es)? Yes Addressing deficits in the following areas: balance, endurance, locomotion, strength, transferring, bowel/bladder control, bathing, dressing, feeding, grooming, toileting and psychosocial support 5. Can the patient actively participate in an intensive therapy program of at least 3 hrs of therapy per day at least 5 days per week? Yes 6. The potential for patient to make measurable gains while on inpatient rehab is excellent 7. Anticipated functional outcomes upon discharge from inpatient rehab are modified independent  with PT, modified independent with OT, n/a with SLP. 8. Estimated rehab length of stay to reach the above functional goals is: 7-9 days 9. Anticipated D/C setting: Home 10. Anticipated post D/C treatments: HH therapy and Outpatient therapy 11. Overall Rehab/Functional Prognosis: excellent  RECOMMENDATIONS: This patient's condition is appropriate for continued rehabilitative care in the following setting: CIR Patient has agreed to participate in recommended program. Yes Note that insurance  prior authorization may be required for reimbursement for recommended care.  Comment: Rehab Admissions Coordinator to follow up.  Thanks,  Meredith Staggers, MD, Mellody Drown    Lavon Paganini Angiulli, PA-C 02/18/2018          Revision History                        Routing History

## 2018-02-23 NOTE — Evaluation (Signed)
Occupational Therapy Assessment and Plan  Patient Details  Name: [name] MRN: [id number] Date of Birth: [birth year]  OT Diagnosis: hemiplegia affecting non-dominant side, muscle weakness (generalized) and coordination disorder Rehab Potential: Rehab Potential (ACUTE ONLY): Excellent ELOS: 7-10 days   Today's Date: 02/23/2018 OT Individual Time:  - 1258-1410 OT Individual Treatment Time Calculation: 72 min       Problem List:  Patient Active Problem List   Diagnosis Date Noted  . Stage 3 chronic kidney disease ([place])   . Acute blood loss anemia   . Essential hypertension   . Poorly controlled type 2 diabetes mellitus ([place])   . 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 ([place]) 02/22/2018  . Muscle spasm   . Vascular headache   . Seizure ([place])   . CVA (cerebral vascular accident) ([place]) 02/16/2018  . Left arm weakness 02/07/2018  . Gingivitis 10/24/2017  . Dysphagia 01/16/2017  . GERD (gastroesophageal reflux disease) 06/01/2015  . Normocytic anemia 06/01/2015  . Healthcare maintenance 06/01/2015  . Diastolic dysfunction 82/50/5397  . Vitamin D deficiency 06/01/2015  . CKD (chronic kidney disease), stage III ([place]) 03/01/2014  . UTI (urinary tract infection) 03/01/2014  . PVD (peripheral vascular disease) ([place]) 06/18/2013  . Hypothyroidism 01/10/2007  . Hyperlipidemia 01/10/2007  . History of tobacco use 01/10/2007  . HYPERTENSION, BENIGN SYSTEMIC 01/10/2007  . History of CVA (cerebrovascular accident) 01/10/2007  . Diabetes mellitus with stage 3 chronic kidney disease ([place]) 01/10/1989    Past Medical History:  Past Medical History:  Diagnosis Date  . Anemia   . Arteriosclerotic cardiovascular disease   . Claudication in peripheral vascular disease ([place])   . CVA (cerebral vascular accident) ([place]) 01/2011   affected left side  . Diabetes mellitus 2000  . GERD (gastroesophageal reflux disease)   . Hyperlipidemia   . Hypertension   . Hypothyroidism   .  Renal insufficiency   . Vitamin D deficiency    Past Surgical History:  Past Surgical History:  Procedure Laterality Date  . CESAREAN SECTION    . LEFT HEART CATHETERIZATION WITH CORONARY ANGIOGRAM N/A 03/08/2012   Procedure: LEFT HEART CATHETERIZATION WITH CORONARY ANGIOGRAM;  Surgeon: Sinclair Grooms, MD;  Location: [facility] CATH LAB;  Service: Cardiovascular;  Laterality: N/A;  . LEFT HEART CATHETERIZATION WITH CORONARY ANGIOGRAM N/A 12/17/2012   Procedure: LEFT HEART CATHETERIZATION WITH CORONARY ANGIOGRAM;  Surgeon: Laverda Page, MD;  Location: [facility] CATH LAB;  Service: Cardiovascular;  Laterality: N/A;  . LOWER EXTREMITY ANGIOGRAM N/A 12/17/2012   Procedure: LOWER EXTREMITY ANGIOGRAM;  Surgeon: Laverda Page, MD;  Location: [facility] CATH LAB;  Service: Cardiovascular;  Laterality: N/A;  . TUBAL LIGATION      Assessment & Plan Clinical Impression: Michell Giuliano is a 67 year old female with history of T2DM, GERD, PVD, CVA X 2 who was admitted on 02/16/18 with one week history of intermittent left sided weakness as well as multiple episodes of generalized seizures. History taken from chart review and patient. She had witnessed seizure with gaze to left and was loaded with dilantin. She was started on Keppra for management of new onset seizures. Marland Kitchen MRI brain reviewed, showing right basal ganglia infarct.  Per report MRI/MRA brain done revealing acute/early subacute infarct in right mid corona radiata with few adjacent punctate foci.  2D echo done revealing #F 60-65% with grade 1 diastolic dysfunction.  Carotid dopplers were negative for ICA stenosis.  Family reported that medications not filled by pharmacy 3 weeks  PTA. Dr. Leonie Man felt that stroke due to small vessel disease--to continue Plavix and Pletal.  She has had intermittent seizure activity (head turn to the left with left arm tonic flexion) therefore Depakote added and Keppra was increased to 1000 mg bid on 04/08. Pulling sensation LUE felt to be  due to muscle spasms. But continued to have seizure type activity therefore was placed on long term monitor. She continued to have symptoms but no seizure activity noted and Dr. Erlinda Hong felt that symptoms due to muscle spasms, weakness versus some volitional component.  AEDs were discontinued and she was started on baclofen to help manage symptoms.   Patient with resultant unsteady gait, poor safety with impulsivity affecting mobility and ADLs. CIR recommended due to functional deficits.     Patient currently requires min with basic self-care skills secondary to muscle weakness and muscle paralysis, decreased cardiorespiratoy endurance, impaired timing and sequencing, unbalanced muscle activation, ataxia and decreased coordination, decreased safety awareness and decreased standing balance, decreased postural control and hemiplegia.  Prior to hospitalization, patient could complete BADLs with independent .  Patient will benefit from skilled intervention to increase independence with basic self-care skills prior to discharge home with daughter.  Anticipate patient will require intermittent supervision and follow up home health.  OT - End of Session Endurance Deficit: Yes OT Assessment Rehab Potential (ACUTE ONLY): Excellent OT Barriers to Discharge: (N/a) OT Patient demonstrates impairments in the following area(s): Balance;Behavior;Safety;Endurance;Skin Integrity;Motor OT Basic ADL's Functional Problem(s): Eating;Grooming;Bathing;Dressing;Toileting OT Advanced ADL's Functional Problem(s): Simple Meal Preparation OT Transfers Functional Problem(s): Toilet;Tub/Shower OT Additional Impairment(s): Fuctional Use of Upper Extremity OT Plan OT Intensity: Minimum of 1-2 x/day, 45 to 90 minutes OT Frequency: 5 out of 7 days OT Duration/Estimated Length of Stay: 7-10 days OT Treatment/Interventions: Balance/vestibular training;Discharge planning;Pain management;Functional electrical stimulation;Self  Care/advanced ADL retraining;UE/LE Coordination activities;Therapeutic Activities;Cognitive remediation/compensation;Disease mangement/prevention;Functional mobility training;Patient/family education;Skin care/wound managment;Therapeutic Exercise;Visual/perceptual remediation/compensation;Wheelchair propulsion/positioning;UE/LE Strength taining/ROM;Splinting/orthotics;Psychosocial support;Neuromuscular re-education;DME/adaptive equipment instruction;Community reintegration OT Self Feeding Anticipated Outcome(s): No goal OT Basic Self-Care Anticipated Outcome(s): Supervision/setup-Mod I  OT Toileting Anticipated Outcome(s): Mod I  OT Bathroom Transfers Anticipated Outcome(s): Supervision/setup-Mod I  OT Recommendation Recommendations for Other Services: Therapeutic Recreation consult Therapeutic Recreation Interventions: Pet therapy Patient destination: Home Follow Up Recommendations: Home health OT;Outpatient OT Equipment Recommended: To be determined   Skilled Therapeutic Intervention Pt greeted supine in bed with no c/o pain. Tx focus on initial evaluation, education on OT role/POC and establishment of patient-centered goals. She completed bathing and dressing tasks at shower level with overall steady assist while ambulating in and out of bathroom without device.  She exhibited L UE FMC deficits and often dropped items, ultimately requiring increased time to meet bilateral FM task demands. Pt was very motivated to meet these demands herself, including triple-knotting shoe laces and opening gripper sock package. She required mod vcs for safety throughout session due to decreased insight into deficits. At end of tx pt was repositioned in bed and left in care of RN.    OT Evaluation Precautions/Restrictions  Precautions Precautions: Fall Precaution Comments: Mild Lt hemi Restrictions Weight Bearing Restrictions: No General Chart Reviewed: Yes Family/Caregiver Present: No Vital Signs Therapy  Vitals Temp: 97.8 F (36.6 C) Temp Source: Oral Pulse Rate: 80 Resp: 18 BP: 120/62 Patient Position (if appropriate): Sitting Oxygen Therapy SpO2: 97 % O2 Device: Room Air Pain Pain Assessment Pain Score: 0-No pain Home Living/Prior Functioning Home Living Available Help at Discharge: Family, Available PRN/intermittently Type of Home: House Home Access: Stairs to enter  Entrance Stairs-Number of Steps: 3 Home Layout: One level Bathroom Shower/Tub: Government social research officer Accessibility: Yes  Lives With: Daughter(D/c home with dtr) IADL History Homemaking Responsibilities: Yes Meal Prep Responsibility: Primary Laundry Responsibility: Primary Cleaning Responsibility: Primary Bill Paying/Finance Responsibility: Primary Shopping Responsibility: Primary 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 Leisure and Hobbies: Reading  Prior Function Level of Independence: Independent with homemaking with ambulation, Independent with basic ADLs, Independent with gait  Able to Take Stairs?: Yes Driving: Yes ADL ADL ADL Comments: Please see functional navigator for ADL status Vision Baseline Vision/History: Wears glasses Wears Glasses: Reading only Patient Visual Report: No change from baseline Vision Assessment?: No apparent visual deficits Perception  Perception: Within Functional Limits Praxis Praxis: Intact Cognition Overall Cognitive Status: No family/caregiver present to determine baseline cognitive functioning Arousal/Alertness: Awake/alert Orientation Level: Person;Place;Situation Person: Oriented Place: Oriented Situation: Oriented Year: 2019 Month: April Day of Week: Correct Memory: Impaired  Immediate Memory Recall: Sock;Blue;Bed Memory Recall: Sock;Blue;Bed Memory Recall Sock: Without Cue Memory Recall Blue: Without Cue Memory Recall Bed: Without Cue Attention: Sustained Sustained Attention: Appears  intact Awareness: Impaired Awareness Impairment: Anticipatory impairment Problem Solving: Impaired Safety/Judgment: Impaired Sensation Sensation Light Touch: Appears Intact Stereognosis: Not tested Hot/Cold: Appears Intact Proprioception: Appears Intact Coordination Gross Motor Movements are Fluid and Coordinated: No Fine Motor Movements are Fluid and Coordinated: No Coordination and Movement Description: Affected by Lt sided hemiparesis  Motor  Motor Motor: Hemiplegia Motor - Skilled Clinical Observations: mild hemiplegia on the L. UE>LE.  Mobility  Transfers Transfers: Sit to Stand;Stand to Sit Sit to Stand: 4: Min assist;From toilet Sit to Stand Details: Verbal cues for precautions/safety Stand to Sit: 4: Min assist;To toilet Stand to Sit Details (indicate cue type and reason): Verbal cues for precautions/safety  Trunk/Postural Assessment  Cervical Assessment Cervical Assessment: Within Functional Limits Thoracic Assessment Thoracic Assessment: Within Functional Limits Lumbar Assessment Lumbar Assessment: Within Functional Limits Postural Control Postural Control: Deficits on evaluation(Unsteadiness during functional transfers)  Balance Balance Balance Assessed: Yes Dynamic Sitting Balance Dynamic Sitting - Level of Assistance: 5: Stand by assistance(donning footwear EOB) Dynamic Standing Balance Dynamic standing- Level of Assistance: 4: Min assist Dynamic Standing - Level of Assistance: (Standing in shower to complete perihygiene) Extremity/Trunk Assessment RUE Assessment RUE Assessment: Within Functional Limits LUE Assessment LUE Assessment: Exceptions to WFL(Mild [facility] and strength deficits that decreased speed of functional task completion)   See Function Navigator for Current Functional Status.   Refer to Care Plan for Long Term Goals  Recommendations for other services: Therapeutic Recreation  Pet therapy   Discharge Criteria: Patient will be discharged  from OT if patient refuses treatment 3 consecutive times without medical reason, if treatment goals not met, if there is a change in medical status, if patient makes no progress towards goals or if patient is discharged from hospital.  The above assessment, treatment plan, treatment alternatives and goals were discussed and mutually agreed upon: by patient  Skeet Simmer 02/23/2018, 5:05 PM

## 2018-02-24 ENCOUNTER — Inpatient Hospital Stay (HOSPITAL_COMMUNITY): Payer: Medicaid Other | Admitting: Occupational Therapy

## 2018-02-24 ENCOUNTER — Inpatient Hospital Stay (HOSPITAL_COMMUNITY): Payer: Medicare HMO

## 2018-02-24 DIAGNOSIS — F05 Delirium due to known physiological condition: Secondary | ICD-10-CM

## 2018-02-24 DIAGNOSIS — R5383 Other fatigue: Secondary | ICD-10-CM

## 2018-02-24 DIAGNOSIS — R0989 Other specified symptoms and signs involving the circulatory and respiratory systems: Secondary | ICD-10-CM

## 2018-02-24 LAB — GLUCOSE, CAPILLARY
GLUCOSE-CAPILLARY: 284 mg/dL — AB (ref 65–99)
GLUCOSE-CAPILLARY: 261 mg/dL — AB (ref 65–99)
Glucose-Capillary: 209 mg/dL — ABNORMAL HIGH (ref 65–99)
Glucose-Capillary: 248 mg/dL — ABNORMAL HIGH (ref 65–99)
Glucose-Capillary: 346 mg/dL — ABNORMAL HIGH (ref 65–99)

## 2018-02-24 LAB — BASIC METABOLIC PANEL
Anion gap: 12 (ref 5–15)
BUN: 32 mg/dL — AB (ref 6–20)
CALCIUM: 9.8 mg/dL (ref 8.9–10.3)
CHLORIDE: 103 mmol/L (ref 101–111)
CO2: 23 mmol/L (ref 22–32)
CREATININE: 1.58 mg/dL — AB (ref 0.44–1.00)
GFR calc non Af Amer: 33 mL/min — ABNORMAL LOW (ref >60)
GFR, EST AFRICAN AMERICAN: 39 mL/min — AB (ref >60)
Glucose, Bld: 287 mg/dL — ABNORMAL HIGH (ref 65–99)
Potassium: 4.3 mmol/L (ref 3.5–5.1)
Sodium: 138 mmol/L (ref 135–145)

## 2018-02-24 NOTE — Progress Notes (Signed)
Occupational Therapy Session Note  Patient Details  Name: [name] MRN: [id number] Date of Birth: February 13, 1951  Today's Date: 02/24/2018 OT Individual Time: 3545-6256 OT Individual Time Calculation (min): 70 min    Short Term Goals: Week 1:  OT Short Term Goal 1 (Week 1): STGs=LTGs due to ELOS  Skilled Therapeutic Interventions/Progress Updates:    Pt greeted supine in bed with boyfriend present. She was eating lunch but having difficultly with table, so OT fixed this for her. Pt presented with increased confusion during transition EOB to continue with lunch. Perseverating on wiping mouth with telephone cord and pushing/pulling table away. Saying she felt strangled and with increased coughing while consuming her food. Noted decreased Lt attention and L UE coordination. A&Ox3. Due to change in mental status, notified RN and vitals were assessed. Pt with elevated BP with RN consenting to EOB therapy only. Pt applied lotion and donned gripper socks EOB with max cues for attention, alertness, and termination of tasks. When she returned to supine, tried to engage her in [place] activity using foam blocks with pt unable to complete due to sleepiness and decreased Lt attention/coordination. Much different presentation than her session yesterday with this therapist. Pt was left with boyfriend and bed alarm set.    Therapy Documentation Precautions:  Precautions Precautions: Fall Precaution Comments: Mild Lt hemi Restrictions Weight Bearing Restrictions: No Vital Signs: Therapy Vitals Temp: 97.9 F (36.6 C) Temp Source: Oral Pulse Rate: 70 Resp: 17 BP: (!) 172/58 Patient Position (if appropriate): Sitting Oxygen Therapy SpO2: 100 % ADL: ADL ADL Comments: Please see functional navigator for ADL status     See Function Navigator for Current Functional Status.   Therapy/Group: Individual Therapy  Naavya Postma A Orton Capell 02/24/2018, 3:50 PM

## 2018-02-24 NOTE — Progress Notes (Addendum)
Patient noted to have some increased confusion this afternoon with therapy. Patient alert and oriented x3- did not know why she was here at first. Patient falling asleep in mid sentence and stares off. Vitals stable. MD Posey Pronto notified. New orders received. Continue to monitor.

## 2018-02-24 NOTE — Plan of Care (Signed)
  Problem: RH BOWEL ELIMINATION Goal: RH STG MANAGE BOWEL WITH ASSISTANCE Description STG Manage Bowel with min Assistance.  Outcome: Progressing Goal: RH STG MANAGE BOWEL W/MEDICATION W/ASSISTANCE Description STG Manage Bowel with Medication with min Assistance.  Outcome: Progressing   Problem: RH SKIN INTEGRITY Goal: RH STG SKIN FREE OF INFECTION/BREAKDOWN Outcome: Progressing   Problem: RH SAFETY Goal: RH STG ADHERE TO SAFETY PRECAUTIONS W/ASSISTANCE/DEVICE Description STG Adhere to Safety Precautions With min Assistance/Device.  Outcome: Progressing Goal: RH STG DECREASED RISK OF FALL WITH ASSISTANCE Description STG Decreased Risk of Fall With min Assistance.  Outcome: Progressing   Problem: RH PAIN MANAGEMENT Goal: RH STG PAIN MANAGED AT OR BELOW PT'S PAIN GOAL Description Less than 3 out of 10  Outcome: Progressing   Problem: Consults Goal: RH STROKE PATIENT EDUCATION Description See Patient Education module for education specifics  Outcome: Progressing   Problem: RH BLADDER ELIMINATION Goal: RH STG MANAGE BLADDER WITH ASSISTANCE Description STG Manage Bladder With min Assistance  Outcome: Not Progressing Goal: RH STG MANAGE BLADDER WITH EQUIPMENT WITH ASSISTANCE Description STG Manage Bladder With Equipment With min Assistance  Outcome: Not Progressing

## 2018-02-24 NOTE — Progress Notes (Signed)
Patient noted to have increased confusion and lethargy.  Repeat MRI ordered.

## 2018-02-24 NOTE — Progress Notes (Signed)
[facility] PHYSICAL [facility]ICINE & REHABILITATION     PROGRESS NOTE  Subjective/Complaints:  Patient seen lying in bed this morning. She states she slept well overnight. She states she had a good first in therapies yesterday. Her phonation is improved.  ROS: denies CP, SOB, nausea, vomiting, diarrhea.  Objective: Vital Signs: Blood pressure (!) 150/51, pulse 71, temperature 98.5 F (36.9 C), temperature source Oral, resp. rate 18, SpO2 100 %. No results found. Recent Labs    02/23/18 0030 02/23/18 0726  WBC 5.1 4.9  HGB 10.2* 10.8*  HCT 31.2* 33.0*  PLT 264 289   Recent Labs    02/22/18 1911 02/23/18 0726  NA 138 137  K 3.7 4.0  CL 104 103  GLUCOSE 227* 185*  BUN 22* 27*  CREATININE 1.43* 1.57*  CALCIUM 9.4 9.4   CBG (last 3)  Recent Labs    02/23/18 1641 02/23/18 2109 02/24/18 0657  GLUCAP 274* 292* 284*    Wt Readings from Last 3 Encounters:  02/16/18 76 kg (167 lb 8.8 oz)  02/15/18 74.6 kg (164 lb 6.4 oz)  02/06/18 76.2 kg (168 lb 1.6 oz)    Physical Exam:  BP (!) 150/51 (BP Location: Right Arm)   Pulse 71   Temp 98.5 F (36.9 C) (Oral)   Resp 18   SpO2 100%  Constitutional: She appears well-developed and well-nourished. No distress.  HENT: Normocephalic and atraumatic.  Eyes: EOM are normal. No discharge. Cardiovascular: RRR. No JVD. Respiratory: Effort normal and breath sounds normal.  GI: Bowel sounds are normal. She exhibits no distension.  Musculoskeletal: She exhibits no edema or tenderness.  Neurological: She is alert.  She was able to follow simple one step command.  dysarthria Motor: LUE/LLE 4+/5 proximal to distal with ataxia (stable) RUE/RLE: 5/5 proximal to distal Skin: Skin is warm and dry. She is not diaphoretic.  Psychiatric: She is slowed.   Assessment/Plan: 1. Functional deficits secondary to acute/ubacute infarcts in right mid corona radiata with adjacent punctate foci which require 3+ hours per day of interdisciplinary therapy  in a comprehensive inpatient rehab setting. Physiatrist is providing close team supervision and 24 hour management of active medical problems listed below. Physiatrist and rehab team continue to assess barriers to discharge/monitor patient progress toward functional and medical goals.  Function:  Bathing Bathing position   Position: Shower  Bathing parts Body parts bathed by patient: Right arm, Left arm, Chest, Abdomen, Front perineal area, Buttocks, Right upper leg, Left upper leg, Right lower leg, Left lower leg Body parts bathed by helper: Back  Bathing assist Assist Level: Touching or steadying assistance(Pt > 75%)      Upper Body Dressing/Undressing Upper body dressing   What is the patient wearing?: Pull over shirt/dress     Pull over shirt/dress - Perfomed by patient: Thread/unthread right sleeve, Thread/unthread left sleeve, Put head through opening, Pull shirt over trunk          Upper body assist Assist Level: Supervision or verbal cues      Lower Body Dressing/Undressing Lower body dressing   What is the patient wearing?: Underwear, Pants, Non-skid slipper socks Underwear - Performed by patient: Thread/unthread right underwear leg, Thread/unthread left underwear leg, Pull underwear up/down   Pants- Performed by patient: Thread/unthread right pants leg, Thread/unthread left pants leg, Pull pants up/down   Non-skid slipper socks- Performed by patient: Don/doff right sock, Don/doff left sock  Lower body assist Assist for lower body dressing: Touching or steadying assistance (Pt > 75%)      Toileting Toileting Toileting activity did not occur: No continent bowel/bladder event Toileting steps completed by patient: Performs perineal hygiene Toileting steps completed by helper: Adjust clothing prior to toileting, Performs perineal hygiene, Adjust clothing after toileting Toileting Assistive Devices: Other (comment)(BSC)  Toileting assist  Assist level: Touching or steadying assistance (Pt.75%)   Transfers Chair/bed transfer   Chair/bed transfer method: Stand pivot Chair/bed transfer assist level: Touching or steadying assistance (Pt > 75%) Chair/bed transfer assistive device: Armrests     Locomotion Ambulation     Max distance: 210 Assist level: Touching or steadying assistance (Pt > 75%)   Wheelchair   Type: Manual Max wheelchair distance: 175ft  Assist Level: Supervision or verbal cues  Cognition Comprehension Comprehension assist level: Follows basic conversation/direction with no assist  Expression Expression assist level: Expresses basic needs/ideas: With no assist  Social Interaction Social Interaction assist level: Interacts appropriately 90% of the time - Needs monitoring or encouragement for participation or interaction.  Problem Solving Problem solving assist level: Solves basic 90% of the time/requires cueing < 10% of the time  Memory Memory assist level: Recognizes or recalls 75 - 89% of the time/requires cueing 10 - 24% of the time    Medical Problem List and Plan: 1. Unsteady gait, poor safety with impulsivity secondary to acute/early subacute infarct in right mid corona radiata with few adjacent punctate foci.  Continue CIR 2.  DVT Prophylaxis/Anticoagulation: Pharmaceutical: Lovenox 3. Pain Management: tylenol prn  4. Mood: LCSW to follow for evaluation and support.  5. Neuropsych: This patient is not fully capable of making decisions on her own behalf. 6. Skin/Wound Care: routine pressure relief. 7. Fluids/Electrolytes/Nutrition: Monitor I/Os.  8. T2DM: Hgb 8.9 On 70/30 insulin bid--Monitor BS ac/hs and titrate as indicated   ? Trending up the last 24 hours. Will consider adjustment of medications if persistently elevated. 9. Muscle spasms: Continue baclofen 10 tid. 10 HTN: Monitor BP bid. On HCTZ and Cozaar daily.    Labile and 4/14 11. Hypothyroid: On supplement  12. Headaches: Reports  right scalp sensitive to touch. Will consider medications if necessary 13. Acute blood loss anemia  Hemoglobin 10.2 on 4/13  Cont to monitor 14. CKD   Creatinine 1.43 on 4/12  Encourage fluids  Continue to monitor   LOS (Days) 2 A FACE TO FACE EVALUATION WAS PERFORMED  Ankit Lorie Phenix 02/24/2018 7:03 AM

## 2018-02-24 NOTE — Progress Notes (Signed)
Patient continues to do drowsy and difficult to arouse. When patient is awaken patient is alert and oriented x4 and follows commands but delayed. Poor attention still noted to left side. Vitals stable. MRI results called to MD Posey Pronto. Continue to monitor.

## 2018-02-25 ENCOUNTER — Inpatient Hospital Stay (HOSPITAL_COMMUNITY): Payer: Medicaid Other

## 2018-02-25 ENCOUNTER — Inpatient Hospital Stay (HOSPITAL_COMMUNITY): Payer: Medicare HMO

## 2018-02-25 DIAGNOSIS — F05 Delirium due to known physiological condition: Secondary | ICD-10-CM

## 2018-02-25 LAB — URINALYSIS, ROUTINE W REFLEX MICROSCOPIC
Bacteria, UA: NONE SEEN
Bilirubin Urine: NEGATIVE
Hgb urine dipstick: NEGATIVE
KETONES UR: NEGATIVE mg/dL
LEUKOCYTES UA: NEGATIVE
Nitrite: NEGATIVE
PH: 5 (ref 5.0–8.0)
Protein, ur: NEGATIVE mg/dL
Specific Gravity, Urine: 1.012 (ref 1.005–1.030)

## 2018-02-25 LAB — COMPREHENSIVE METABOLIC PANEL
ALK PHOS: 47 U/L (ref 38–126)
ALT: 25 U/L (ref 14–54)
ANION GAP: 11 (ref 5–15)
AST: 23 U/L (ref 15–41)
Albumin: 3.8 g/dL (ref 3.5–5.0)
BILIRUBIN TOTAL: 0.4 mg/dL (ref 0.3–1.2)
BUN: 29 mg/dL — ABNORMAL HIGH (ref 6–20)
CALCIUM: 9.5 mg/dL (ref 8.9–10.3)
CO2: 23 mmol/L (ref 22–32)
Chloride: 102 mmol/L (ref 101–111)
Creatinine, Ser: 1.56 mg/dL — ABNORMAL HIGH (ref 0.44–1.00)
GFR, EST AFRICAN AMERICAN: 39 mL/min — AB (ref >60)
GFR, EST NON AFRICAN AMERICAN: 34 mL/min — AB (ref >60)
Glucose, Bld: 322 mg/dL — ABNORMAL HIGH (ref 65–99)
Potassium: 4 mmol/L (ref 3.5–5.1)
Sodium: 136 mmol/L (ref 135–145)
TOTAL PROTEIN: 7.8 g/dL (ref 6.5–8.1)

## 2018-02-25 LAB — GLUCOSE, CAPILLARY
GLUCOSE-CAPILLARY: 214 mg/dL — AB (ref 65–99)
Glucose-Capillary: 320 mg/dL — ABNORMAL HIGH (ref 65–99)
Glucose-Capillary: 307 mg/dL — ABNORMAL HIGH (ref 65–99)
Glucose-Capillary: 190 mg/dL — ABNORMAL HIGH (ref 65–99)

## 2018-02-25 MED ORDER — SODIUM CHLORIDE 0.9 % IV SOLN
INTRAVENOUS | Status: DC
Start: 2018-02-25 — End: 2018-02-27
  Administered 2018-02-25 – 2018-02-26 (×5): via INTRAVENOUS

## 2018-02-25 MED ORDER — ASPIRIN 325 MG PO TABS
325.0000 mg | ORAL_TABLET | Freq: Every day | ORAL | Status: DC
  Administered 2018-02-25 – 2018-03-05 (×9): 325 mg via ORAL
  Filled 2018-02-25 (×9): qty 1

## 2018-02-25 MED ORDER — INSULIN ASPART PROT & ASPART (70-30 MIX) 100 UNIT/ML ~~LOC~~ SUSP
30.0000 [IU] | Freq: Two times a day (BID) | SUBCUTANEOUS | Status: DC
  Administered 2018-02-25 – 2018-03-05 (×16): 30 [IU] via SUBCUTANEOUS

## 2018-02-25 MED ORDER — SODIUM CHLORIDE 0.9 % IV SOLN: INTRAVENOUS | Status: DC

## 2018-02-25 NOTE — IPOC Note (Signed)
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)   Stage 3 chronic kidney disease (HCC)   Acute blood loss anemia   Essential hypertension   Poorly controlled type 2 diabetes mellitus ([place])   Labile blood pressure   Acute confusional state   Lethargy     Functional Problem List: Nursing Bladder, Bowel  PT Balance, Behavior, Endurance, Motor, Perception, Safety, Skin Integrity  OT Balance, Behavior, Safety, Endurance, Skin Integrity, Motor  SLP Cognition  TR         Basic ADL's: OT Eating, Grooming, Bathing, Dressing, Toileting     Advanced  ADL's: OT Simple Meal Preparation     Transfers: PT Bed Mobility, Bed to Chair, Car, Furniture, Floor  OT Toilet, Metallurgist: PT Ambulation, Emergency planning/management officer, Stairs     Additional Impairments: OT Fuctional Use of Upper Extremity  SLP Social Cognition   Problem Solving, Memory, Attention, Awareness  TR      Anticipated Outcomes Item Anticipated Outcome  Self Feeding No goal  Swallowing      Basic self-care  Supervision/setup-Mod I   Toileting  Mod I    Bathroom Transfers Supervision/setup-Mod I   Bowel/Bladder  to remain continent x 2  Transfers  Mod I with LRAD   Locomotion  Mod I with LRAD   Communication     Cognition  Min-Supervision A  Pain  less than 2  Safety/Judgment  pt will remain fall free while in rehab   Therapy Plan: PT Intensity: Minimum of 1-2 x/day ,45 to 90 minutes PT Frequency: 5 out of 7 days PT Duration Estimated Length of Stay: 7-10  OT Intensity: Minimum of 1-2 x/day, 45 to 90 minutes OT Frequency: 5 out of 7 days OT Duration/Estimated Length of Stay: 7-10 days SLP Intensity: Minumum of 1-2 x/day, 30 to 90 minutes SLP Frequency: 3 to 5 out of  7 days SLP Duration/Estimated Length of Stay: 9-14 days     Team Interventions: Nursing Interventions Patient/Family Education, Disease Management/Prevention, Discharge Planning, Cognitive Remediation/Compensation  PT interventions Ambulation/gait training, Training and development officer, Cognitive remediation/compensation, Disease management/prevention, Community reintegration, Discharge planning, DME/adaptive equipment instruction, Functional mobility training, Patient/family education, Neuromuscular re-education, Psychosocial support, Pain management, Skin care/wound management, Functional electrical stimulation, Splinting/orthotics, Therapeutic Exercise, Therapeutic Activities, Stair training, UE/LE Strength taining/ROM, Wheelchair propulsion/positioning, UE/LE Coordination activities, Visual/perceptual remediation/compensation  OT Interventions Balance/vestibular training, Discharge planning, Pain management, Functional electrical stimulation, Self Care/advanced ADL retraining, UE/LE Coordination activities, Therapeutic Activities, Cognitive remediation/compensation, Disease mangement/prevention, Functional mobility training, Patient/family education, Skin care/wound managment, Therapeutic Exercise, Visual/perceptual remediation/compensation, Wheelchair propulsion/positioning, UE/LE Strength taining/ROM, Splinting/orthotics, Psychosocial support, Neuromuscular re-education, DME/adaptive equipment instruction, Community reintegration  SLP Interventions Cognitive remediation/compensation, Cueing hierarchy, Functional tasks  TR Interventions    SW/CM Interventions Discharge Planning, Psychosocial Support, Patient/Family Education   Barriers to Discharge MD  Medical stability  Nursing      PT Inaccessible home environment, Decreased caregiver support, Home environment access/layout    OT Lack of/limited family support    SLP Decreased caregiver support    SW       Team Discharge  Planning: Destination: PT-Home ,OT- Home , SLP-Home Projected Follow-up: PT-Home health PT, OT-  Home health OT, Outpatient OT, SLP-Outpatient SLP, Home Health SLP, 24 hour supervision/assistance Projected Equipment Needs: PT-To be determined, Rolling walker  with 5" wheels, OT- To be determined, SLP-None recommended by SLP Equipment Details: PT- , OT-  Patient/family involved in discharge planning: PT- Patient,  OT-Patient, SLP-Patient  MD ELOS: 10-14 days Medical Rehab Prognosis:  Excellent Assessment: The patient has been admitted for CIR therapies with the diagnosis of right MCA infarcts with extension. The team will be addressing functional mobility, strength, stamina, balance, safety, adaptive techniques and equipment, self-care, bowel and bladder mgt, patient and caregiver education, neuromuscular reeducation, visual spatial awareness, cognition, communication, community reentry, ego support. Goals have been set at supervision for basic mobility, self-care, and cognition/communication.    Meredith Staggers, MD, FAAPMR      See Team Conference Notes for weekly updates to the plan of care

## 2018-02-25 NOTE — Progress Notes (Signed)
Receive patient in bed very drowsy, difficult to wake her up, unable to verbally express herself. At around 7:45 pm, she was wild awake, talking appropriately, able to feed herself. Neuro visited her and order cont. fluid labs and MRA, this RN took patient for test, UA collected. Neuro ordered pt. To bed rest, and be flat in bed. Pt. Vitals were stable. We continue to monitor.

## 2018-02-25 NOTE — Progress Notes (Addendum)
Images reviewed, discussed with Neurology, plan for CTA/P.  BP meds being held and patient started on IVF.  Appreciate Neuro recs.   Attempted to call daughter, left voicemail to call back.

## 2018-02-25 NOTE — Progress Notes (Signed)
Reason for consult: Extension of infarction on repeat MRI brain performed due to worsening mental status  History: Ms. LORIENE TAUNTON is a 67 y.o. female with history of peripheral vascular disease, stroke x2affecting the left side with no residual deficits, hypertension, diabetes, hyperlipidemia presented on 02/16/18 with left-sided weakness, partial seizure and headache. Workup revealed subacute corona radiata stroke thought to be from small vessel disease. MRA performed at the time was poor quality due to motion artifact.  Patient was discharged to rehab, has episodes of shaking of left arm initially thought to be seizures, however cointinuous EEG that captured events showed no EEG correlate and later thought to be muscle spasms.  She has been since yesterday, more lethargic and having waxing and waning of mental status. MRI brain was performed which showed "Small acute to subacute infarcts clustered in the right corona radiata and frontal white matter that have increased in number from 02/16/2018, suspicious for underlying flow limiting stenosis or embolic source."  Neurology was consulted. 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  Assessed patient around 1 am and she was doing much better, alert and following commands     ROS: negative except above   Examination  Vital signs in last 24 hours: Temp:  [97.9 F (36.6 C)-98.9 F (37.2 C)] 98.9 F (37.2 C) (04/15 0035) Pulse Rate:  [70-76] 76 (04/15 0035) Resp:  [12-22] 12 (04/15 0035) BP: (153-172)/(58-78) 153/63 (04/15 0035) SpO2:  [96 %-100 %] 97 % (04/15 0035)  General: Not in distress, cooperative CVS: pulse-normal rate and rhythm RS: breathing comfortably Extremities: normal   Neuro: MS: Alert, oriented, follows commands CN: pupils equal and reactive,  EOMI, face symmetric, tongue midline, normal sensation over face, No  neglect Motor: Left UE 4/5  Reflexes: 2+ bilaterally over patella, biceps, plantars: flexor Sensation: reduced in left side compared to right Coordination: left arm dysmetria Gait: not tested  Basic Metabolic Panel: Recent Labs  Lab 02/21/18 0626 02/22/18 0624 02/22/18 1911 02/23/18 0726 02/24/18 0726  NA 140 141 138 137 138  K 4.2 4.0 3.7 4.0 4.3  CL 105 107 104 103 103  CO2 24 23 20* 23 23  GLUCOSE 203* 164* 227* 185* 287*  BUN 15 18 22* 27* 32*  CREATININE 1.27* 1.33* 1.43* 1.57* 1.58*  CALCIUM 9.6 9.6 9.4 9.4 9.8    CBC: Recent Labs  Lab 02/20/18 0538 02/21/18 0626 02/22/18 0624 02/23/18 0030 02/23/18 0726  WBC 5.8 5.9 5.1 5.1 4.9  NEUTROABS  --   --   --  2.2  --   HGB 10.1* 10.8* 10.4* 10.2* 10.8*  HCT 30.4* 32.8* 31.7* 31.2* 33.0*  MCV 86.9 88.2 87.8 88.1 87.8  PLT 248 284 290 264 289     Coagulation Studies: No results for input(s): LABPROT, INR in the last 72 hours.  Imaging Reviewed:     ASSESSMENT AND PLAN  Ms. VALYNCIA WIENS is a 67 y.o. female with history of peripheral vascular disease, stroke x2affecting the left side with no residual deficits, hypertension, diabetes, hyperlipidemia who expanded her right corona radiata stroke.   Stat MR Angiogram  ( intially CTA ordered but cancelled to impaired renal function and clinical improvement) shows severe  Right M1 stenosis.    Plan Add ASA to [facility], DAPT x 3 months Hold BP meds Hold PT/OT for 24hrs Neurochecks  q4h,  Will need to watch for fluctuating symptoms, worsening in sitting,  standing position. IF she continues to have worsening, may consider diagnostic angio and possible intracranial stent (after failing maximal medical therapy)    Stroke team to follow   [place] Pager Number [phone] For questions after 7pm please refer to AMION to reach the Neurologist on call

## 2018-02-25 NOTE — Progress Notes (Signed)
Social Work  Social Work Assessment and Plan  Patient Details  Name: [name] MRN: [id number] Date of Birth: August 03, 1951  Today's Date: 02/25/2018  Problem List:  Patient Active Problem List   Diagnosis Date Noted  . Labile blood pressure   . Acute confusional state   . Lethargy   . Stage 3 chronic kidney disease ([place])   . Acute blood loss anemia   . Essential hypertension   . Poorly controlled type 2 diabetes mellitus ([place])   . 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 ([place]) 02/22/2018  . Muscle spasm   . Vascular headache   . Seizure ([place])   . CVA (cerebral vascular accident) ([place]) 02/16/2018  . Left arm weakness 02/07/2018  . Gingivitis 10/24/2017  . Dysphagia 01/16/2017  . GERD (gastroesophageal reflux disease) 06/01/2015  . Normocytic anemia 06/01/2015  . Healthcare maintenance 06/01/2015  . Diastolic dysfunction 66/04/3015  . Vitamin D deficiency 06/01/2015  . CKD (chronic kidney disease), stage III ([place]) 03/01/2014  . UTI (urinary tract infection) 03/01/2014  . PVD (peripheral vascular disease) ([place]) 06/18/2013  . Hypothyroidism 01/10/2007  . Hyperlipidemia 01/10/2007  . History of tobacco use 01/10/2007  . HYPERTENSION, BENIGN SYSTEMIC 01/10/2007  . History of CVA (cerebrovascular accident) 01/10/2007  . Diabetes mellitus with stage 3 chronic kidney disease ([place]) 01/10/1989   Past Medical History:  Past Medical History:  Diagnosis Date  . Anemia   . Arteriosclerotic cardiovascular disease   . Claudication in peripheral vascular disease ([place])   . CVA (cerebral vascular accident) ([place]) 01/2011   affected left side  . Diabetes mellitus 2000  . GERD (gastroesophageal reflux disease)   . Hyperlipidemia   . Hypertension   . Hypothyroidism   . Renal insufficiency   . Vitamin D deficiency    Past Surgical History:  Past Surgical History:  Procedure Laterality Date  . CESAREAN SECTION    . LEFT HEART CATHETERIZATION WITH CORONARY ANGIOGRAM  N/A 03/08/2012   Procedure: LEFT HEART CATHETERIZATION WITH CORONARY ANGIOGRAM;  Surgeon: Sinclair Grooms, MD;  Location: [facility] CATH LAB;  Service: Cardiovascular;  Laterality: N/A;  . LEFT HEART CATHETERIZATION WITH CORONARY ANGIOGRAM N/A 12/17/2012   Procedure: LEFT HEART CATHETERIZATION WITH CORONARY ANGIOGRAM;  Surgeon: Laverda Page, MD;  Location: [facility] CATH LAB;  Service: Cardiovascular;  Laterality: N/A;  . LOWER EXTREMITY ANGIOGRAM N/A 12/17/2012   Procedure: LOWER EXTREMITY ANGIOGRAM;  Surgeon: Laverda Page, MD;  Location: [facility] CATH LAB;  Service: Cardiovascular;  Laterality: N/A;  . TUBAL LIGATION     Social History:  reports that she quit smoking about 7 years ago. She has never used smokeless tobacco. She reports that she does not drink alcohol or use drugs.  Family / Support Systems Marital Status: Single Patient Roles: Partner, Parent(together with boyfriend x 18 yrs) Spouse/Significant Other: boyfriend, Sam Wall @ (C) (C) [phone] Children: daughter, Marveen Reeks (local) @ (C) [phone] Anticipated Caregiver: Upon admission, there was no targeted caregiver and initial goal was set for mod ind.  After CIR admit, extension of CVA suspected and goals may need to be downgraded.  Daughter confirms that there is nobody who can provide 24/7 assistance. Ability/Limitations of Caregiver: Daughter works full time and has 4 children. Caregiver Availability: Intermittent Family Dynamics: Pt describes daughter and boyfriend as very supportive, however, daughter does work full-time.  Boyfriend does live locally but does not drive cannot provide assistance to patient.  Social History Preferred language: English Religion: [employer] Cultural Background:  NA Education: High school Read: Yes Write: Yes Employment Status: Retired Date Retired/Disabled/Unemployed: Approximately 10 years ago Freight forwarder Issues: None Guardian/Conservator: None-Per MD, patient is not fully  capable of making decisions on her own behalf-defer to daughter.   Abuse/Neglect Abuse/Neglect Assessment Can Be Completed: Yes Physical Abuse: Denies Verbal Abuse: Denies Sexual Abuse: Denies Exploitation of patient/patient's resources: Denies Self-Neglect: Denies  Emotional Status Pt's affect, behavior adn adjustment status: atient pleasant and receptive to interview, however, speech is soft and slowed.  She is able to provide basic personal information.  Her affect seems a little flat, however, she denies any significant emotional distress.  Will request neuropsych consult to further assess mood and cognition.  T Recent Psychosocial Issues: None Pyschiatric History: None Substance Abuse History: None  Patient / Family Perceptions, Expectations & Goals Pt/Family understanding of illness & functional limitations: Patient able to offer basic understanding of her stroke and of her current functional limitations and reason for CIR.  Daughter also with good understanding of stroke and aware of further workup to be completed. Premorbid pt/family roles/activities: Patient was completely independent.  She was the one who would do the driving for herself and her boyfriend to doctor's appointments, shopping trips, etc. Anticipated changes in roles/activities/participation: Dependent on revised goals, daughter may need to consider if she is able to provide increased caregiver support to patient. Pt/family expectations/goals: "I hope I can get home from here."  Daughter and she has concerns about patient reaching the level that she could be alone and about her ability to provide needed support.  Community [employer] Agencies: None Premorbid Home Care/DME Agencies: None Transportation available at discharge: Yes, intermittently from daughter when she is available. Resource referrals recommended: Neuropsychology  Discharge Planning Living Arrangements: Alone Support Systems:  Spouse/significant other, Children Type of Residence: Private residence Insurance Resources: Medicare, [place] (specify county) Pensions consultant: SSI, Radio broadcast assistant Screen Referred: No Living Expenses: Education officer, community Management: Patient Does the patient have any problems obtaining your medications?: No Home Management: Patient was independent Patient/Family Preliminary Plans: Plan upon CIR admit was for patient to return to her own apartment and a mod independent level.  Planning will need to be revised if she is unable to reach that level of function. [place] Barriers to Discharge: Decreased caregiver support, Lack of/limited family support Social Work Anticipated Follow Up Needs: HH/OP Expected length of stay: 9-14 days  Clinical Impression Unfortunate woman here following a stroke and now neurology considering further workup.  At time of initial assessment, goals had been set up for modified independent, however, neurology now concerned she has had extension of the stroke and team will need to determine if goals will need to be downgraded.  Patient has a supportive daughter and boyfriend but they are unable to provide 24/7 support if that is needed.  Patient does not appear to be in any significant emotional distress.  Affect is rather flat and speech is soft.  Will refer for neuropsychology involvement for additional support.  Social work to follow for support and discharge planning needs.  Khianna Blazina 02/25/2018, 10:19 AM

## 2018-02-25 NOTE — Progress Notes (Signed)
STROKE TEAM PROGRESS NOTE   SUBJECTIVE (INTERVAL HISTORY) Pt  Was seen by vascular neurology team 10 days ago and initially thought to have a lacunar infarct in the right brain and was transferred to rehabilitation. She had change in neurological statusover the weekend and was found to be lethargic with Biaxin and waning mental status. A repeat MRI showed cluster of small subacute to acute right coronary to infarct extending into the right frontal cortex and MRA which was much better quality than the previous study from 4/6 last 19 showed high-grade stenosis of right MCA bifurcation raising concern for large vessel disease etiology for stroke rather than lacunar disease..   OBJECTIVE Temp:  [98.5 F (36.9 C)-98.9 F (37.2 C)] 98.5 F (36.9 C) (04/15 1440) Pulse Rate:  [76-81] 81 (04/15 1440) Resp:  [12-18] 18 (04/15 1440) BP: (150-153)/(55-63) 150/55 (04/15 1440) SpO2:  [94 %-97 %] 94 % (04/15 1440)  CBC:  Recent Labs  Lab 02/23/18 0030 02/23/18 0726  WBC 5.1 4.9  NEUTROABS 2.2  --   HGB 10.2* 10.8*  HCT 31.2* 33.0*  MCV 88.1 87.8  PLT 264 176    Basic Metabolic Panel:  Recent Labs  Lab 02/24/18 0726 02/25/18 0554  NA 138 136  K 4.3 4.0  CL 103 102  CO2 23 23  GLUCOSE 287* 322*  BUN 32* 29*  CREATININE 1.58* 1.56*  CALCIUM 9.8 9.5    Lipid Panel:     Component Value Date/Time   CHOL 168 02/16/2018 0200   CHOL 200 (H) 10/18/2016 1518   TRIG 255 (H) 02/16/2018 0200   HDL 30 (L) 02/16/2018 0200   HDL 34 (L) 10/18/2016 1518   CHOLHDL 5.6 02/16/2018 0200   VLDL 51 (H) 02/16/2018 0200   LDLCALC 87 02/16/2018 0200   LDLCALC 99 10/18/2016 1518   HgbA1c:  Lab Results  Component Value Date   HGBA1C 8.9 (H) 02/16/2018   Urine Drug Screen:     Component Value Date/Time   LABOPIA NONE DETECTED 02/16/2018 1201   COCAINSCRNUR NONE DETECTED 02/16/2018 1201   LABBENZ NONE DETECTED 02/16/2018 1201   AMPHETMU NONE DETECTED 02/16/2018 1201   THCU NONE DETECTED  02/16/2018 1201   LABBARB NONE DETECTED 02/16/2018 1201    Alcohol Level     Component Value Date/Time   ETH <10 02/15/2018 2328    IMAGING I have personally reviewed the radiological images below and agree with the radiology interpretations.  Ct Head Wo Contrast 02/16/2018 IMPRESSION:  Suggestion of acute infarct in the right periventricular and insular region. Consider MRI correlation if clinically indicated. No acute intracranial hemorrhage or mass effect.   Mr Brain Wo Contrast 02/16/2018 MRI head:  1. Subcentimeter acute/early subacute infarction and right mid corona radiata with few adjacent punctate foci. No associated hemorrhage or mass effect.  2. Mild chronic microvascular ischemic changes and mild parenchymal volume loss of the brain.  3. Small chronic infarcts in right genu of corpus callosum, right body of corpus callosum, and right hemi pons.  MRA head:  Severe motion artifact. Suboptimal assessment for stenosis or small aneurysm. No large vessel occlusion or large aneurysm identified.   Transthoracic Echocardiogram - Left ventricle: The cavity size was normal. Wall thickness was   normal. Systolic function was normal. The estimated ejection   fraction was in the range of 60% to 65%. Wall motion was normal;   there were no regional wall motion abnormalities. Doppler   parameters are consistent with abnormal left ventricular  relaxation (grade 1 diastolic dysfunction).  Bilateral Carotid Dopplers  02/16/2018 1-39% ICA plaquing. Vertebral artery flow is antegrade.   EEG This normal EEG is recorded in the waking and sleep state. There was no seizure or seizure predisposition recorded on this study. Please note that a normal EEG does not preclude the possibility of epilepsy.   LTM EEG day 1 There was no pushbutton activations events during this recording.  Automated spike detection program did not detect any spikes. Seizure detection program did not detect any seizures  .  Waking background activities were marked by 8 cps posterior dominant developed on the as opposed to right.  There is a persistent right hemispheric delta slowing involving the right frontotemporal region throughout the recording.  There is no interictal epileptiform discharges.  No clinical or subclinical seizures present. Clinical interpretation: This day 1 of intensive EEG monitoring with simultaneous video monitoring did not record any clinical subclinical seizures.  EEG was abnormal due to persistent right frontotemporal delta slowing suggestive of neuronal dysfunction on a structural vascular or degenerative basis.  Clinical correlation is advised.  LTM EEG day 2 Day 2:  numerous events of interest were recorded marked by difficulty using left arm, some tremulousness particular with intention or action or trying to utilize left arm.  Sometimes patients opening and closing his left fist.  Sometimes there is a she is trying to move her fingers.  Time there is a more complex left arm movements side to side up and down in a synchronous fashion events appeared to be in waxing and waning fashion sometimes lasting a long time however decreased with rest and increased with again trying to utilize left arm.  At times patient also complaining of left face been" drawn "" simultaneously with the left arm movements.  With patient is completely awake alert and aware during these spells.  At times she is eating and utilizing left arm during that time accompanied by numerous pushbutton events as patient experiencing potentially some difficulties in using the left arm. Electrographically was all these numerous events of interest there is unchanged waking background activity is present prior during and after these events of interest. Semiology of these events of interest are inconsistent with seizures.  In addition left facial drawing and asymmetry have volitional component possibly.  However left arm movements and  difficulty associated with utilizing left arm could be related to other etiologies including underlying left arm weakness itself possibly some movement disorder. Background activities were unchanged from previously recorded in marked by right frontotemporal slowing suggestive of neuronal dysfunction in the right frontotemporal region. Clinical interpretation: This day 2 of intensive EEG monitoring with simultaneous video monitoring did not record any clinical subclinical seizures.  EEG was abnormal due to right frontotemporal delta slowing suggestive of neuronal dysfunction on a structural vascular or degenerative basis.   Numerous events of interest were recorded involving left arm and left face as discussed above in details.  There was no any EEG changes to suggest seizures.  However sometimes with simple partial seizures that there is no significant EEG changes.  However semiology of this events are inconsistent with simple partial seizures.  Again could be related to underlying weakness itself, movement disorder versus some volitional component associated with this events.  Clinical correlation is advised.   PHYSICAL EXAM Temp:  [98.5 F (36.9 C)-98.9 F (37.2 C)] 98.5 F (36.9 C) (04/15 1440) Pulse Rate:  [76-81] 81 (04/15 1440) Resp:  [12-18] 18 (04/15 1440) BP: (150-153)/(55-63) 150/55 (  04/15 1440) SpO2:  [94 %-97 %] 94 % (04/15 1440)  General -  Pleasant middle-aged lady, in no apparent distress.  Ophthalmologic - Fundi not visualized due to small pupils.  Cardiovascular - Regular rate and rhythm.  Mental Status -  Level of arousal and orientation to time, place, and person were intact. Language including expression, naming, repetition, comprehension was assessed and found intact. Fund of Knowledge was assessed and was intact.  Cranial Nerves II - XII - II - Visual field intact OU. III, IV, VI - Extraocular movements intact. V - Facial sensation intact bilaterally. VII - left  nasolabial fold mild flattening. VIII - Hearing & vestibular intact bilaterally. X - Palate elevates symmetrically. XI - Chin turning & shoulder shrug intact bilaterally. XII - Tongue protrusion intact.  Motor Strength - The patient's strength was normal in all limbs except 4/5 LUE proximal and distally with dexterity difficulty.  Bulk was normal and fasciculations were absent.  Diminished fine finger movements on the left. Orbits right over left upper extremity. Mild weakness of left grip Motor Tone - Muscle tone was assessed at the neck and appendages and was normal.  Reflexes - The patient's reflexes were 1+ in all extremities and she had no pathological reflexes.  Sensory - Light touch, temperature/pinprick were assessed and were symmetrical.    Coordination - normal Tremor was absent.  Gait and Station - deferred.      ASSESSMENT/PLAN Ms. SONNET RIZOR is a 67 y.o. female with history of peripheral vascular disease, stroke x2 affecting the left side with no residual deficits, hypertension, diabetes, hyperlipidemia presenting with left-sided weakness, simple partial seizures, and a new headache. She did not receive IV t-PA due to late presentation.   Stroke:  subacute right mid corona radiata infarctand frontal cortical, likely due to intrinsic middle cerebral artery atherosclerotic disease versus embolus  Resultant   Mild left facial droop and left arm incoordination   CT head - Suggestion of acute infarct in the right periventricular and insular region.  MRI head - Subcentimeter acute/early subacute infarction at right mid corona radiata with few adjacent punctate foci with multiple old infarcts.  MRA head - motion degraded but otherwise unremarkable  UDS - negative  Carotid Doppler - 1-39% ICA plaquing. Vertebral artery flow is antegrade.   2D Echo - EF 60-65%  LDL - 87  HgbA1c - 8.9  VTE prophylaxis - [place] heparin Diet Carb Modified Fluid consistency: Thin; Room  service appropriate? Yes  clopidogrel 75 mg daily and Pletal prior to admission, now on clopidogrel 75 mg daily and Aspirin 81 mg daily Continue this DAPT on discharge.   Patient will be counseled to be compliant with her antithrombotic medications  Ongoing aggressive stroke risk factor management  Therapy recommendations: CIR  Disposition:  Pending  Left UE intermittent muscle spasm  Episodes of left eye gaze, left head turning, left arm tonic flexion at home  More episodes of left arm shaking with todd's paralysis during hospitalization   No LOC, able to converse well during episodes  On keppra, increase to 1000mg  bid, now off  On depakote, increase to 750mg  tid, now off  On Vimpat 100mg  bid, now off  depakote level 59 -> 43->56->59->60    Spot EEG normal  Pt continues to report multiple episodes day and night  LTM EEG x 2 days with multiple events but no seizure correlation  Initially concerning for simple partial seizure, but now seems more consistent with LUE spasm  Will  d/c all AEDs and put on baclofen  Continue monitoring  Hx of stroke  First one in 2004 and on plavix  01/2011 right CC stroke with left sided weakness, balance issue and slurry speech - residue of walking with hopping on the left side - on plavix and platel  Hypertension  Stable  Long-term BP goal normotensive  Hyperlipidemia  Lipid lowering medication PTA: lopid,  Intolerant to statins  LDL 87, goal < 70  Current lipid lowering medication: lopid  Continue at discharge  Diabetes  HgbA1c 8.9, goal < 7.0  Uncontrolled  Off medication PTA  Hyperglycemia improved  SSI  CBG monitoring  AKI, slowly improving  Cre 1.50->1.46->1.71->1.35->1.36->1.33->1.27->1.33, stable  Encourage po intake  Pt eats well so far  Other Stroke Risk Factors  Advanced age  Former cigarette smoker - quit 7 years ago.  Obesity, There is no height or weight on file to calculate BMI.,  recommend weight loss, diet and exercise as appropriate   PVD - on plavix and platel  Other Active Problems  Grave's disease s/p iodine ablation - on supplement - TSH  Low but FT4 normal  Hospital day # 3  I have personally examined this patient, reviewed notes, independently viewed imaging studies, participated in medical decision making and plan of care.ROS completed by me personally and pertinent positives fully documented  I have made any additions or clarifications directly to the above note. Agree with note above. Recommend dual antiplatelet therapy of Plavix and aspirin and check transesophageal echocardiogram for cardiac source of embolism as well as loop recorder for paroxysmal atrial fibrillation. Long discussion with patient and family at the bedside as well as with rehabilitation team is practitioner and answered questions. Greater than 50% time during this 35 minute visit was spent on counseling and coordination of care about the patient's stroke and answered questions. Follow-up as an outpatient in the stroke clinic in 6 weeks. Stroke team will sign off. Kindly call for questions.  Antony Contras, MD Medical Director [place] Pager: [phone] 02/25/2018 4:26 PM    t Stroke Continuity provider, please refer to [email or web address] After hours, contact General Neurology

## 2018-02-25 NOTE — Progress Notes (Addendum)
Occupational Therapy Session Note  Patient Details  Name: [name] MRN: [id number] Date of Birth: 12/01/1950  Today's Date: 02/25/2018 OT Individual Time: 1015-1053 Session 2: 1301-1401 OT Individual Time Calculation (min): 38 min Session 2: 16mn   Short Term Goals: Week 1:  OT Short Term Goal 1 (Week 1): STGs=LTGs due to ELOS  Skilled Therapeutic Interventions/Progress Updates:    Session 1: RN approved session and clarified bed rest orders as pt allowed to move around room and to limit exertion. Pt agreeable to therapy with no c/o pain, however stating that she wanted to know "why the room kept moving up and down" and resisting explanation of dizziness, RN notified. Pt transferred supine to EOB with the [facility] to limit exertion during transfer. Pt set up for bathing while seated EOB, requiring vc for activity pacing and energy conservation techniques. Education provided re fall risk reduction during distal LB bathing/dressing and completing these tasks in sitting instead of standing, which pt attempted. Pt completed sit to stand transfer and completed anterior/posterior peri-care with (S). Discussion with pt re tub transfers and OT recommendation that pt use tub transfer bench. Pt returned to supine with bed alarm set and all needs met.   Session 2: Pt still on bedrest orders. Pt greeted in bed agreeable to bed level therapy with no c/o pain. Pt completed 2 sets of therex with 3lb weighted bar and level 2 orange theraband. Vc and demonstration provided for 2x12 chest press, shoulder raises, scapular retraction, and bicep curls in order to increase B UE strength and endurance during ADL/IADL tasks. Pt then completed [facility] LLCtraining, with a focus on the impaired L UE during bimanual integration, using both hands to thread small beads onto wire. Pt required intermittent manual facilitation to position L UE to hold bead properly.  Multiple vc required throughout session for attention to task and  redirection, with pt presenting with increased fatigue and distractibility. Pt left in supine with bed alarm activated and all needs met. 15 min missed d/t pt bed rest orders and being unable to tolerate more therapy this date. OT will f/u as indicated.   Therapy Documentation Precautions:  Precautions Precautions: Fall Precaution Comments: Mild Lt hemi Restrictions Weight Bearing Restrictions: No  Pain: Pain Assessment Pain Scale: 0-10 Pain Score: 0-No pain ADL: ADL ADL Comments: Please see functional navigator for ADL status  See Function Navigator for Current Functional Status.   Therapy/Group: Individual Therapy  SCurtis Sites4/15/2019, 10:53 AM

## 2018-02-25 NOTE — Progress Notes (Signed)
Dr. Leonie Man feel prior MRI was suboptimal study due to patient motion and did not reveal M1/M2 stenosis. He feels that she has had slight extension in stroke which is embolic due to unknown source and recommends TEE/loop recorder for work up. He also recommends DAPT X 3 months instead of 3 weeks. Consult placed with Cardiology.

## 2018-02-25 NOTE — Progress Notes (Signed)
[facility] PHYSICAL [facility]ICINE & REHABILITATION     PROGRESS NOTE  Subjective/Complaints:  Pt lying in bed. Denies pain. Feels more fatigued.   ROS: Patient denies fever, rash, sore throat, blurred vision, nausea, vomiting, diarrhea, cough, shortness of breath or chest pain, joint or back pain, headache, or mood change.    Objective: Vital Signs: Blood pressure (!) 153/63, pulse 76, temperature 98.9 F (37.2 C), temperature source Oral, resp. rate 12, SpO2 97 %. Mr Jodene Nam Head Wo Contrast  Result Date: 02/25/2018 CLINICAL DATA:  Follow-up stroke. History of hypertension, hyperlipidemia, diabetes and stroke. EXAM: MRA HEAD WITHOUT CONTRAST TECHNIQUE: Angiographic images of the Circle of Willis were obtained using MRA technique without intravenous contrast. COMPARISON:  MRA head February 16, 2018 FINDINGS: ANTERIOR CIRCULATION: Normal flow related enhancement of the included cervical, petrous, cavernous and supraclinoid internal carotid arteries. Patent anterior communicating artery. Patent anterior and middle cerebral arteries. Severe tandem stenosis RIGHT A2 and included A3 segments. Severe stenosis distal LEFT M1 segment and, LEFT M2 origin, superior division. Severe stenosis RIGHT M2 segment, superior division. No large vessel occlusion, aneurysm. POSTERIOR CIRCULATION: LEFT vertebral artery is dominant. Basilar artery is patent, with normal flow related enhancement of the main branch vessels. Patent posterior cerebral arteries, moderate tandem stenoses. Small bilateral posterior communicating arteries present. No large vessel occlusion, flow limiting stenosis,  aneurysm. ANATOMIC VARIANTS: None. Source images and MIP images were reviewed. IMPRESSION: 1. No emergent large vessel. 2. Multifocal severe stenosis RIGHT anterior and bilateral middle cerebral arteries most compatible with atherosclerosis. 3. Moderate stenoses bilateral posterior cerebral arteries. Electronically Signed   By: Elon Alas  M.D.   On: 02/25/2018 05:43   Mr Brain Wo Contrast  Result Date: 02/24/2018 CLINICAL DATA:  Confusion, acute, unexplained. EXAM: MRI HEAD WITHOUT CONTRAST TECHNIQUE: Multiplanar, multiecho pulse sequences of the brain and surrounding structures were obtained without intravenous contrast. COMPARISON:  02/16/2018 FINDINGS: Brain: Acute to subacute subcentimeter infarcts in the right corona radiata and juxta cortical frontal white matter that are more numerous than seen on comparison. These are all in the right MCA distribution. Chronic small vessel ischemia with remote lacunar infarct in the left caudate head. Remote right lateral pontine infarct. Chronic small vessel ischemic type gliosis in the cerebral white matter that is comparatively mild. No hemorrhage, hydrocephalus, or masslike finding. Brain atrophy. Vascular: Major flow voids are preserved. Skull and upper cervical spine: No evidence of marrow lesion Sinuses/Orbits: Negative IMPRESSION: 1. Small acute to subacute infarcts clustered in the right corona radiata and frontal white matter that have increased in number from 02/16/2018, suspicious for underlying flow limiting stenosis or embolic source. 2. Chronic small vessel ischemia with remote right pontine and left caudate infarct. Electronically Signed   By: Monte Fantasia M.D.   On: 02/24/2018 17:59   Recent Labs    02/23/18 0030 02/23/18 0726  WBC 5.1 4.9  HGB 10.2* 10.8*  HCT 31.2* 33.0*  PLT 264 289   Recent Labs    02/24/18 0726 02/25/18 0554  NA 138 136  K 4.3 4.0  CL 103 102  GLUCOSE 287* 322*  BUN 32* 29*  CREATININE 1.58* 1.56*  CALCIUM 9.8 9.5   CBG (last 3)  Recent Labs    02/24/18 1816 02/24/18 2107 02/25/18 0617  GLUCAP 248* 346* 320*    Wt Readings from Last 3 Encounters:  02/16/18 76 kg (167 lb 8.8 oz)  02/15/18 74.6 kg (164 lb 6.4 oz)  02/06/18 76.2 kg (168 lb 1.6 oz)  Physical Exam:  BP (!) 153/63 (BP Location: Right Arm) Comment: rn notified    Pulse 76   Temp 98.9 F (37.2 C) (Oral)   Resp 12   SpO2 97%  Constitutional: No distress . Vital signs reviewed. HEENT: EOMI, oral membranes moist Cardiovascular: RRR without murmur. No JVD    Respiratory: CTA Bilaterally without wheezes or rales. Normal effort    GI: BS +, non-tender, non-distended  Musculoskeletal: She exhibits no edema or tenderness.  Neurological: she is drowsy but follows commans. Mild left facial weakness Dysarthric, voice low volume Motor: LUE/LLE 3+ to 4/5 proximal to distal   RUE/RLE: 4-5/5 proximal to distal Skin: Skin is warm and dry. She is not diaphoretic.  Psychiatric: flat but cooperative   Assessment/Plan: 1. 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 Physiatrist is providing close team supervision and 24 hour management of active medical problems listed below. Physiatrist and rehab team continue to assess barriers to discharge/monitor patient progress toward functional and medical goals.  Function:  Bathing Bathing position   Position: Shower  Bathing parts Body parts bathed by patient: Right arm, Left arm, Chest, Abdomen, Front perineal area, Buttocks, Right upper leg, Left upper leg, Right lower leg, Left lower leg Body parts bathed by helper: Back  Bathing assist Assist Level: Touching or steadying assistance(Pt > 75%)      Upper Body Dressing/Undressing Upper body dressing   What is the patient wearing?: Pull over shirt/dress     Pull over shirt/dress - Perfomed by patient: Thread/unthread right sleeve, Thread/unthread left sleeve, Put head through opening, Pull shirt over trunk          Upper body assist Assist Level: Supervision or verbal cues      Lower Body Dressing/Undressing Lower body dressing   What is the patient wearing?: Underwear, Pants, Non-skid  slipper socks Underwear - Performed by patient: Thread/unthread right underwear leg, Thread/unthread left underwear leg, Pull underwear up/down   Pants- Performed by patient: Thread/unthread right pants leg, Thread/unthread left pants leg, Pull pants up/down   Non-skid slipper socks- Performed by patient: Don/doff right sock, Don/doff left sock                    Lower body assist Assist for lower body dressing: Touching or steadying assistance (Pt > 75%)      Toileting Toileting Toileting activity did not occur: No continent bowel/bladder event Toileting steps completed by patient: Performs perineal hygiene, Adjust clothing prior to toileting Toileting steps completed by helper: Adjust clothing after toileting Toileting Assistive Devices: Grab bar or rail  Toileting assist Assist level: Touching or steadying assistance (Pt.75%)   Transfers Chair/bed transfer   Chair/bed transfer method: Stand pivot Chair/bed transfer assist level: Touching or steadying assistance (Pt > 75%) Chair/bed transfer assistive device: Armrests     Locomotion Ambulation     Max distance: 210 Assist level: Touching or steadying assistance (Pt > 75%)   Wheelchair   Type: Manual Max wheelchair distance: 168ft  Assist Level: Supervision or verbal cues  Cognition Comprehension Comprehension assist level: Understands basic 50 - 74% of the time/ requires cueing 25 - 49% of the time  Expression Expression assist level: Expresses basic 50 - 74% of the time/requires cueing 25 - 49% of the time. Needs to repeat parts of sentences.  Social Interaction Social Interaction assist level: Interacts appropriately  50 - 74% of the time - May be physically or verbally inappropriate.  Problem Solving Problem solving assist level: Solves basic 50 - 74% of the time/requires cueing 25 - 49% of the time  Memory Memory assist level: Recognizes or recalls 50 - 74% of the time/requires cueing 25 - 49% of the time     Medical Problem List and Plan: 1. Unsteady gait, poor safety with impulsivity secondary to acute/early subacute infarct in right mid corona radiata with few adjacent punctate foci.   -increased cluster of infarcts in right CR/frontal WM on MRI this weekend  Bedside activities only today. Neuro checks   -appreciate neuro help 2.  DVT Prophylaxis/Anticoagulation: Pharmaceutical: Lovenox 3. Pain Management: tylenol prn  4. Mood: LCSW to follow for evaluation and support.  5. Neuropsych: This patient is not fully capable of making decisions on her own behalf. 6. Skin/Wound Care: routine pressure relief. 7. Fluids/Electrolytes/Nutrition: Monitor I/Os.    -bun/cr stable today   -continue IVF 8. T2DM: Hgb 8.9 On 70/30 insulin bid--Monitor BS ac/hs and titrate as indicated   -sugars continue to be elevated.   -increase 70/30 to 30u bid 9. Muscle spasms: Continue baclofen 10 tid. 10 HTN:   (HCTZ and Cozaar daily.)   Fair control today, bp meds on hold 11. Hypothyroid: On supplement  12. Headaches: stable at present 13. Acute blood loss anemia  Hemoglobin 10.2 on 4/13  Cont to monitor 14. CKD   Creatinine 1.56 on 4/15  Encourage fluids  Continue to monitor   LOS (Days) 3 A FACE TO FACE EVALUATION WAS PERFORMED  Meredith Staggers 02/25/2018 9:12 AM

## 2018-02-25 NOTE — Progress Notes (Signed)
Physical Therapy Session Note  Patient Details  Name: [name] MRN: [id number] Date of Birth: 1951-02-10  Today's Date: 02/25/2018 PT Individual Time: 7628-3151 AND 1100-1130 PT Individual Time Calculation (min): 30 min and 30 min Missed time: 15 min   Short Term Goals: Week 1:  PT Short Term Goal 1 (Week 1): STG =LTG due to ELOS   Skilled Therapeutic Interventions/Progress Updates:    Pt on bed rest upon PT arrival. Pt missed 15 minutes of skilled therapy tx secondary to medical hold.   Session 1: MD approved pt for beside level therapies, therapist returned to work with pt. Pt performed supine therex for strengthening to include 2 x 10 of each: heel slides, SLR, SAQ, hip abduction, hip adduction, bridges- therapist providing verbal cues for proper techniques. Pt left supine in bed at end of session with bed alarm set and needs in reach.   Session 2: Pt supine in bed upon PT arrival, seen for bed level exercises this session. Pt agreeable to therapy tx and denies pain. Pt transferred from supine>sitting EOB with supervision. Pt performed seated therex at EOB for UE and LE strengthening, 2 x 10 of each and orange theraband for resistance: plantar flexion, hamstring curls, LAQ 1.5#, hip abduction, UE horizontal abduction, shoulder flexion- therapist providing verbal cues for proper techniques. Pt transferred back to supine with supervision, performed supine chest press 2 x 10 with 3# dowel for strengthening. Pt left supine in bed with needs in reach and bed alarm set.   Therapy Documentation Precautions:  Precautions Precautions: Fall Precaution Comments: Mild Lt hemi Restrictions Weight Bearing Restrictions: No   See Function Navigator for Current Functional Status.   Therapy/Group: Individual Therapy  Netta Corrigan, PT, DPT 02/25/2018, 7:45 AM

## 2018-02-26 ENCOUNTER — Inpatient Hospital Stay (HOSPITAL_COMMUNITY): Payer: Medicare HMO | Admitting: Speech Pathology

## 2018-02-26 ENCOUNTER — Inpatient Hospital Stay (HOSPITAL_COMMUNITY): Payer: Medicare HMO | Admitting: Occupational Therapy

## 2018-02-26 ENCOUNTER — Inpatient Hospital Stay (HOSPITAL_COMMUNITY): Payer: Medicaid Other | Admitting: Physical Therapy

## 2018-02-26 DIAGNOSIS — I63311 Cerebral infarction due to thrombosis of right middle cerebral artery: Secondary | ICD-10-CM

## 2018-02-26 LAB — BASIC METABOLIC PANEL WITH GFR
Anion gap: 10 (ref 5–15)
BUN: 25 mg/dL — ABNORMAL HIGH (ref 6–20)
CO2: 22 mmol/L (ref 22–32)
Calcium: 9.2 mg/dL (ref 8.9–10.3)
Chloride: 108 mmol/L (ref 101–111)
Creatinine, Ser: 1.32 mg/dL — ABNORMAL HIGH (ref 0.44–1.00)
GFR calc Af Amer: 48 mL/min — ABNORMAL LOW
GFR calc non Af Amer: 41 mL/min — ABNORMAL LOW
Glucose, Bld: 203 mg/dL — ABNORMAL HIGH (ref 65–99)
Potassium: 4.1 mmol/L (ref 3.5–5.1)
Sodium: 140 mmol/L (ref 135–145)

## 2018-02-26 LAB — GLUCOSE, CAPILLARY
Glucose-Capillary: 200 mg/dL — ABNORMAL HIGH (ref 65–99)
Glucose-Capillary: 148 mg/dL — ABNORMAL HIGH (ref 65–99)
Glucose-Capillary: 197 mg/dL — ABNORMAL HIGH (ref 65–99)
Glucose-Capillary: 181 mg/dL — ABNORMAL HIGH (ref 65–99)

## 2018-02-26 LAB — URINE CULTURE: CULTURE: NO GROWTH

## 2018-02-26 NOTE — Progress Notes (Signed)
Occupational Therapy Session Note  Patient Details  Name: [name] MRN: [id number] Date of Birth: 1951-08-08  Today's Date: 02/26/2018 OT Individual Time: 1975-8832 OT Individual Time Calculation (min): 71 min    Short Term Goals: Week 1:  OT Short Term Goal 1 (Week 1): STGs=LTGs due to ELOS   Skilled Therapeutic Interventions/Progress Updates:    Upon entering the room, pt supine in bed awaiting therapist and motivated for therapy session. Pt declined bathing and dressing this session. Pt ambulating 100' with overall supervision and without use of AD. Pt engaged in dynavision task while standing to address visual scanning, L UE NMR, standing endurance, and balance. Pt completing task while standing for 4 minutes x 2. Pt standing on foam wedge for second attempt and not LOB occurring. Pt overall close supervision during task. Pt average reaction time was much slower on L side due to scanning but also due to use of weaker L UE. Pt's reaction time of 1.94 seconds and hitting 124 targets for both rounds. Pt seated at table for " Old Maid" card game with focus on use of L UE to hold cards, shuffle cards, flip cards over, and palmar translation. Pt able to complete tasks with increased time and min verbal cues for technique. Pt returning to room in same manner as above and seated in recliner chair. Chair alarm activated and call bell within reach.   Therapy Documentation Precautions:  Precautions Precautions: Fall Precaution Comments: Mild Lt hemi Restrictions Weight Bearing Restrictions: No General:   Vital Signs: Therapy Vitals Pulse Rate: 80 Patient Position (if appropriate): Sitting Oxygen Therapy SpO2: 100 % O2 Device: Room Air Pain: Pain Assessment Pain Scale: 0-10 Pain Score: 0-No pain ADL: ADL ADL Comments: Please see functional navigator for ADL status  See Function Navigator for Current Functional Status.   Therapy/Group: Individual Therapy  [name] 02/26/2018, 11:24 AM

## 2018-02-26 NOTE — Progress Notes (Signed)
Physical Therapy Session Note  Patient Details  Name: [name] MRN: [id number] Date of Birth: [birth year]  Today's Date: 02/26/2018 PT Individual Time: 1100-1155 PT Individual Time Calculation (min): 55 min   Skilled Therapeutic Interventions/Progress Updates:    Pt agreeable and excited for therapy.  Pt performs gait throughout unit with supervision including home and controlled environments and obstacle negotiation.  Berg balance test performed with pt scoring 45/56, pt aware of high fall risk.  Pt performs stair negotiation 4 stairs x 3 with bilat handrails with supervision.  Ramp and uneven surface negotiation with supervision.  Pt requests to perform fine motor control.  Pt performs card shuffling, flipping and foam block task with LT hand with increased time and supervision. Pt left in chair with alarm set, needs at hand.  Therapy Documentation Precautions:  Precautions Precautions: Fall Precaution Comments: Mild Lt hemi Restrictions Weight Bearing Restrictions: No Pain: Pain Assessment Pain Scale: 0-10 Pain Score: 0-No pain  Balance: Berg Balance Test Sit to Stand: Able to stand without using hands and stabilize independently Standing Unsupported: Able to stand safely 2 minutes Sitting with Back Unsupported but Feet Supported on Floor or Stool: Able to sit safely and securely 2 minutes Stand to Sit: Sits safely with minimal use of hands Transfers: Able to transfer safely, minor use of hands Standing Unsupported with Eyes Closed: Able to stand 10 seconds safely Standing Ubsupported with Feet Together: Able to place feet together independently and stand for 1 minute with supervision From Standing, Reach Forward with Outstretched Arm: Can reach forward >12 cm safely (5") From Standing Position, Pick up Object from Floor: Able to pick up shoe safely and easily From Standing Position, Turn to Look Behind Over each Shoulder: Looks behind one side only/other side shows less  weight shift Turn 360 Degrees: Able to turn 360 degrees safely but slowly Standing Unsupported, Alternately Place Feet on Step/Stool: Able to complete >2 steps/needs minimal assist Standing Unsupported, One Foot in Front: Able to plae foot ahead of the other independently and hold 30 seconds Standing on One Leg: Able to lift leg independently and hold equal to or more than 3 seconds Total Score: 45   See Function Navigator for Current Functional Status.   Therapy/Group: Individual Therapy  Natasha Burda 02/26/2018, 11:28 AM

## 2018-02-26 NOTE — Care Management (Signed)
[facility] Individual Statement of Services  Patient Name:  [name]  Date:  02/26/2018  Welcome to the [facility].  Our goal is to provide you with an individualized program based on your diagnosis and situation, designed to meet your specific needs.  With this comprehensive rehabilitation program, you will be expected to participate in at least 3 hours of rehabilitation therapies Monday-Friday, with modified therapy programming on the weekends.  Your rehabilitation program will include the following services:  Physical Therapy (PT), Occupational Therapy (OT), Speech Therapy (ST), 24 hour per day rehabilitation nursing, Neuropsychology, Case Management (Social Worker), Rehabilitation Medicine, Nutrition Services and Pharmacy Services  Weekly team conferences will be held on Tuesdays to discuss your progress.  Your Social Worker will talk with you frequently to get your input and to update you on team discussions.  Team conferences with you and your family in attendance may also be held.  Expected length of stay: 9-14 days    Overall anticipated outcome: modified independent  Depending on your progress and recovery, your program may change. Your Social Worker will coordinate services and will keep you informed of any changes. Your Social Worker's name and contact numbers are listed  below.  The following services may also be recommended but are not provided by the [facility] will be made to provide these services after discharge if needed.  Arrangements include referral to agencies that provide these services.  Your insurance has been verified to be:  [employer] Your primary doctor is:  [facility]  Pertinent information will be shared with your doctor and your insurance company.  Social Worker:  [place], [place] or [phone]   Information discussed with and copy given to patient by: Lennart Pall, 02/26/2018, 3:25 PM

## 2018-02-26 NOTE — Progress Notes (Signed)
Speech Language Pathology Daily Session Note  Patient Details  Name: [name] MRN: [id number] Date of Birth: 05-18-1951  Today's Date: 02/26/2018 SLP Individual Time: 0730-0830 SLP Individual Time Calculation (min): 60 min  Short Term Goals: Week 1: SLP Short Term Goal 1 (Week 1): Pt will demonstrate sustained attention in functional task for 20 minutes with supervision A verbal cues for redirection. SLP Short Term Goal 2 (Week 1): Pt will recall novel, daily information with min A verbal cues for use of external aid. SLP Short Term Goal 3 (Week 1): Pt will demonstrate functional problem solving for basic and familiar (mildly complex) tasks eith min A verbal cues. SLP Short Term Goal 4 (Week 1): Pt will self-monitor functional and verbal errors in functional tasks with mod A verbal cues. SLP Short Term Goal 5 (Week 1): Pt will demonstrate safety awareness due to CVA deficits in verbal and functional tasks with min A verbal cues.  Skilled Therapeutic Interventions: Skilled treatment session focused on cognitive goals. SLP facilitated session by providing Supervision verbal cues for problem solving during a basic money management task. Pt required Min A verbal cues to self-monitor and correct errors during task. Pt also demonstrated selective attention for ~30 minutes in a mildly distractive environment with Mod I. Pt left upright in bed with alarm on and all needs within reach. Continue with current plan of care.       Function:  Cognition Comprehension Comprehension assist level: Follows basic conversation/direction with extra time/assistive device  Expression   Expression assist level: Expresses basic 90% of the time/requires cueing < 10% of the time.  Social Interaction Social Interaction assist level: Interacts appropriately with others with medication or extra time (anti-anxiety, antidepressant).  Problem Solving Problem solving assist level: Solves basic 90% of the time/requires  cueing < 10% of the time  Memory Memory assist level: Recognizes or recalls 75 - 89% of the time/requires cueing 10 - 24% of the time    Pain Pain Assessment Pain Scale: 0-10 Pain Score: 0-No pain  Therapy/Group: Individual Therapy  Meredeth Ide  SLP - Student 02/26/2018, 8:39 AM

## 2018-02-26 NOTE — Plan of Care (Signed)
[name] [name]

## 2018-02-26 NOTE — Progress Notes (Signed)
Patient information reviewed and entered into eRehab system by Deandrea Rion, RN, CRRN, PPS Coordinator.  Information including medical coding and functional independence measure will be reviewed and updated through discharge.     Per nursing patient was given "Data Collection Information Summary for Patients in Inpatient Rehabilitation Facilities with attached "Privacy Act Statement-Health Care Records" upon admission.  

## 2018-02-26 NOTE — Progress Notes (Signed)
[place] PHYSICAL MEDICINE & REHABILITATION     PROGRESS NOTE  Subjective/Complaints:  Patient in bed speaking with speech therapy.  Feeling better today.  "No more spells"  ROS: Patient denies fever, rash, sore throat, blurred vision, nausea, vomiting, diarrhea, cough, shortness of breath or chest pain, joint or back pain, headache, or mood change.   Objective: Vital Signs: Blood pressure (!) 171/75, pulse 80, temperature (!) 97.5 F (36.4 C), temperature source Oral, resp. rate 17, SpO2 100 %. Mr Jodene Nam Head Wo Contrast  Result Date: 02/25/2018 CLINICAL DATA:  Follow-up stroke. History of hypertension, hyperlipidemia, diabetes and stroke. EXAM: MRA HEAD WITHOUT CONTRAST TECHNIQUE: Angiographic images of the Circle of Willis were obtained using MRA technique without intravenous contrast. COMPARISON:  MRA head February 16, 2018 FINDINGS: ANTERIOR CIRCULATION: Normal flow related enhancement of the included cervical, petrous, cavernous and supraclinoid internal carotid arteries. Patent anterior communicating artery. Patent anterior and middle cerebral arteries. Severe tandem stenosis RIGHT A2 and included A3 segments. Severe stenosis distal LEFT M1 segment and, LEFT M2 origin, superior division. Severe stenosis RIGHT M2 segment, superior division. No large vessel occlusion, aneurysm. POSTERIOR CIRCULATION: LEFT vertebral artery is dominant. Basilar artery is patent, with normal flow related enhancement of the main branch vessels. Patent posterior cerebral arteries, moderate tandem stenoses. Small bilateral posterior communicating arteries present. No large vessel occlusion, flow limiting stenosis,  aneurysm. ANATOMIC VARIANTS: None. Source images and MIP images were reviewed. IMPRESSION: 1. No emergent large vessel. 2. Multifocal severe stenosis RIGHT anterior and bilateral middle cerebral arteries most compatible with atherosclerosis. 3. Moderate stenoses bilateral posterior cerebral arteries.  Electronically Signed   By: Elon Alas M.D.   On: 02/25/2018 05:43   Mr Brain Wo Contrast  Result Date: 02/24/2018 CLINICAL DATA:  Confusion, acute, unexplained. EXAM: MRI HEAD WITHOUT CONTRAST TECHNIQUE: Multiplanar, multiecho pulse sequences of the brain and surrounding structures were obtained without intravenous contrast. COMPARISON:  02/16/2018 FINDINGS: Brain: Acute to subacute subcentimeter infarcts in the right corona radiata and juxta cortical frontal white matter that are more numerous than seen on comparison. These are all in the right MCA distribution. Chronic small vessel ischemia with remote lacunar infarct in the left caudate head. Remote right lateral pontine infarct. Chronic small vessel ischemic type gliosis in the cerebral white matter that is comparatively mild. No hemorrhage, hydrocephalus, or masslike finding. Brain atrophy. Vascular: Major flow voids are preserved. Skull and upper cervical spine: No evidence of marrow lesion Sinuses/Orbits: Negative IMPRESSION: 1. Small acute to subacute infarcts clustered in the right corona radiata and frontal white matter that have increased in number from 02/16/2018, suspicious for underlying flow limiting stenosis or embolic source. 2. Chronic small vessel ischemia with remote right pontine and left caudate infarct. Electronically Signed   By: Monte Fantasia M.D.   On: 02/24/2018 17:59   No results for input(s): WBC, HGB, HCT, PLT in the last 72 hours. Recent Labs    02/25/18 0554 02/26/18 0616  NA 136 140  K 4.0 4.1  CL 102 108  GLUCOSE 322* 203*  BUN 29* 25*  CREATININE 1.56* 1.32*  CALCIUM 9.5 9.2   CBG (last 3)  Recent Labs    02/25/18 1636 02/25/18 2134 02/26/18 0546  GLUCAP 214* 190* 200*    Wt Readings from Last 3 Encounters:  02/16/18 76 kg (167 lb 8.8 oz)  02/15/18 74.6 kg (164 lb 6.4 oz)  02/06/18 76.2 kg (168 lb 1.6 oz)    Physical Exam:  BP (!) 171/75 (BP  Location: Right Arm)   Pulse 80   Temp (!)  97.5 F (36.4 C) (Oral)   Resp 17   SpO2 100%  Constitutional: No distress . Vital signs reviewed. HEENT: EOMI, oral membranes moist Cardiovascular: RRR without murmur. No JVD    Respiratory: CTA Bilaterally without wheezes or rales. Normal effort    GI: BS +, non-tender, non-distended  Musculoskeletal: She exhibits no edema or tenderness.  Neurological: Much more alert and initiating. Dysarthric, voice volume increased Motor: LUE/LLE 3+ to 4/5 proximal to distal --stable to improved RUE/RLE: 4-5/5 proximal to distal Skin: Skin is warm and dry. She is not diaphoretic.  Psychiatric: Pleasant and cooperative  Assessment/Plan: 1. 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 Physiatrist is providing close team supervision and 24 hour management of active medical problems listed below. Physiatrist and rehab team continue to assess barriers to discharge/monitor patient progress toward functional and medical goals.  Function:  Bathing Bathing position   Position: Sitting EOB  Bathing parts Body parts bathed by patient: Right arm, Left arm, Chest, Abdomen, Front perineal area, Buttocks, Right upper leg, Left upper leg, Right lower leg, Left lower leg Body parts bathed by helper: Back  Bathing assist Assist Level: Supervision or verbal cues      Upper Body Dressing/Undressing Upper body dressing   What is the patient wearing?: Hospital gown     Pull over shirt/dress - Perfomed by patient: Thread/unthread right sleeve, Thread/unthread left sleeve, Put head through opening, Pull shirt over trunk          Upper body assist Assist Level: Supervision or verbal cues      Lower Body Dressing/Undressing Lower body dressing   What is the patient wearing?: Non-skid slipper socks Underwear - Performed by patient:  Thread/unthread right underwear leg, Thread/unthread left underwear leg, Pull underwear up/down   Pants- Performed by patient: Thread/unthread right pants leg, Thread/unthread left pants leg, Pull pants up/down   Non-skid slipper socks- Performed by patient: Don/doff right sock, Don/doff left sock                    Lower body assist Assist for lower body dressing: Supervision or verbal cues      Toileting Toileting Toileting activity did not occur: No continent bowel/bladder event Toileting steps completed by patient: Performs perineal hygiene, Adjust clothing prior to toileting Toileting steps completed by helper: Adjust clothing prior to toileting, Performs perineal hygiene, Adjust clothing after toileting Toileting Assistive Devices: Grab bar or rail  Toileting assist Assist level: Touching or steadying assistance (Pt.75%)   Transfers Chair/bed transfer Chair/bed transfer activity did not occur: Safety/medical concerns Chair/bed transfer method: Stand pivot Chair/bed transfer assist level: Moderate assist (Pt 50 - 74%/lift or lower)(per Starlyn Skeans, NT report) Chair/bed transfer assistive device: Armrests     Locomotion Ambulation     Max distance: 210 Assist level: Touching or steadying assistance (Pt > 75%)   Wheelchair   Type: Manual Max wheelchair distance: 14ft  Assist Level: Supervision or verbal cues  Cognition Comprehension Comprehension assist level: Follows basic conversation/direction with extra time/assistive device  Expression Expression assist level: Expresses basic 90% of the time/requires cueing < 10% of the time.  Social Interaction Social Interaction assist level: Interacts appropriately with others with medication or extra time (anti-anxiety, antidepressant).  Problem Solving Problem solving assist level: Solves basic 90% of  the time/requires cueing < 10% of the time  Memory Memory assist level: Recognizes or recalls 75 - 89% of the time/requires  cueing 10 - 24% of the time    Medical Problem List and Plan: 1. Unsteady gait, poor safety with impulsivity secondary to acute/early subacute infarct in right mid corona radiata with few adjacent punctate foci.   -increased cluster of infarcts in right CR/frontal WM on MRI this weekend  Resume therapies as tolerated today   -Neurology recommending transesophageal echocardiogram and loop recorder   -Plavix and aspirin for stroke prophylaxis 2.  DVT Prophylaxis/Anticoagulation: Pharmaceutical: Lovenox 3. Pain Management: tylenol prn  4. Mood: LCSW to follow for evaluation and support.  5. Neuropsych: This patient is not fully capable of making decisions on her own behalf. 6. Skin/Wound Care: routine pressure relief. 7. Fluids/Electrolytes/Nutrition: Monitor I/Os.    -bun/cr improved today to 25/1.32   -Maintain continuous IVF, perhaps change to nightly schedule tomorrow 8. T2DM: Hgb 8.9 On 70/30 insulin bid--Monitor BS ac/hs and titrate as indicated   -Continued poor control  -increase 70/30 insulin again to 32u bid 9. Muscle spasms: Baclofen held due to lethargy.  No active spasms at present 10 HTN:   (HCTZ and Cozaar daily.)   Some systolic elevation, continue to hold medications for now 11. Hypothyroid: On supplement  12. Headaches: stable at present 13. Acute blood loss anemia  Hemoglobin 10.2 on 4/13  Cont to monitor 14. CKD   Creatinine 1.56 on 4/15  Encourage fluids  Continue to monitor   LOS (Days) 4 A FACE TO FACE EVALUATION WAS PERFORMED  Meredith Staggers 02/26/2018 9:01 AM

## 2018-02-27 ENCOUNTER — Inpatient Hospital Stay (HOSPITAL_COMMUNITY): Payer: Medicare HMO | Admitting: Physical Therapy

## 2018-02-27 ENCOUNTER — Inpatient Hospital Stay (HOSPITAL_COMMUNITY): Payer: Medicaid Other | Admitting: Speech Pathology

## 2018-02-27 ENCOUNTER — Encounter (HOSPITAL_COMMUNITY): Payer: Medicaid Other | Admitting: Psychology

## 2018-02-27 ENCOUNTER — Inpatient Hospital Stay (HOSPITAL_COMMUNITY): Payer: Medicare HMO | Admitting: Occupational Therapy

## 2018-02-27 DIAGNOSIS — D62 Acute posthemorrhagic anemia: Secondary | ICD-10-CM

## 2018-02-27 DIAGNOSIS — I63411 Cerebral infarction due to embolism of right middle cerebral artery: Secondary | ICD-10-CM

## 2018-02-27 DIAGNOSIS — E1165 Type 2 diabetes mellitus with hyperglycemia: Secondary | ICD-10-CM

## 2018-02-27 DIAGNOSIS — I10 Essential (primary) hypertension: Secondary | ICD-10-CM

## 2018-02-27 LAB — BASIC METABOLIC PANEL
Anion gap: 8 (ref 5–15)
BUN: 23 mg/dL — AB (ref 6–20)
CO2: 21 mmol/L — ABNORMAL LOW (ref 22–32)
CREATININE: 1.33 mg/dL — AB (ref 0.44–1.00)
Calcium: 9.1 mg/dL (ref 8.9–10.3)
Chloride: 110 mmol/L (ref 101–111)
GFR, EST AFRICAN AMERICAN: 47 mL/min — AB (ref >60)
GFR, EST NON AFRICAN AMERICAN: 41 mL/min — AB (ref >60)
Glucose, Bld: 145 mg/dL — ABNORMAL HIGH (ref 65–99)
POTASSIUM: 3.8 mmol/L (ref 3.5–5.1)
Sodium: 139 mmol/L (ref 135–145)

## 2018-02-27 LAB — CBC
HCT: 28.9 % — ABNORMAL LOW (ref 36.0–46.0)
Hemoglobin: 9.5 g/dL — ABNORMAL LOW (ref 12.0–15.0)
MCH: 29.4 pg (ref 26.0–34.0)
MCHC: 32.9 g/dL (ref 30.0–36.0)
MCV: 89.5 fL (ref 78.0–100.0)
PLATELETS: 273 10*3/uL (ref 150–400)
RBC: 3.23 MIL/uL — ABNORMAL LOW (ref 3.87–5.11)
RDW: 14.9 % (ref 11.5–15.5)
WBC: 4.7 10*3/uL (ref 4.0–10.5)

## 2018-02-27 LAB — GLUCOSE, CAPILLARY
GLUCOSE-CAPILLARY: 135 mg/dL — AB (ref 65–99)
GLUCOSE-CAPILLARY: 130 mg/dL — AB (ref 65–99)
GLUCOSE-CAPILLARY: 147 mg/dL — AB (ref 65–99)
Glucose-Capillary: 136 mg/dL — ABNORMAL HIGH (ref 65–99)

## 2018-02-27 MED ORDER — TRAZODONE HCL 50 MG PO TABS
50.0000 mg | ORAL_TABLET | Freq: Every evening | ORAL | Status: DC | PRN
  Administered 2018-02-27 – 2018-02-28 (×2): 50 mg via ORAL
  Filled 2018-02-27 (×2): qty 1

## 2018-02-27 NOTE — H&P (View-Only) (Signed)
[place] PHYSICAL MEDICINE & REHABILITATION     PROGRESS NOTE  Subjective/Complaints:  Had a good day with therapy. No weakness, dizziness, shakes. Did have problems sleeping last night.    ROS: Patient denies fever, rash, sore throat, blurred vision, nausea, vomiting, diarrhea, cough, shortness of breath or chest pain, joint or back pain, headache, or mood change.   Objective: Vital Signs: Blood pressure (!) 141/58, pulse 79, temperature 98.7 F (37.1 C), temperature source Oral, resp. rate 18, SpO2 97 %. No results found. Recent Labs    02/27/18 0534  WBC 4.7  HGB 9.5*  HCT 28.9*  PLT 273   Recent Labs    02/26/18 0616 02/27/18 0534  NA 140 139  K 4.1 3.8  CL 108 110  GLUCOSE 203* 145*  BUN 25* 23*  CREATININE 1.32* 1.33*  CALCIUM 9.2 9.1   CBG (last 3)  Recent Labs    02/26/18 1648 02/26/18 2057 02/27/18 0639  GLUCAP 197* 181* 135*    Wt Readings from Last 3 Encounters:  02/16/18 76 kg (167 lb 8.8 oz)  02/15/18 74.6 kg (164 lb 6.4 oz)  02/06/18 76.2 kg (168 lb 1.6 oz)    Physical Exam:  BP (!) 141/58 (BP Location: Right Arm)   Pulse 79   Temp 98.7 F (37.1 C) (Oral)   Resp 18   SpO2 97%  Constitutional: No distress . Vital signs reviewed. HEENT: EOMI, oral membranes moist Cardiovascular: RRR without murmur. No JVD    Respiratory: CTA Bilaterally without wheezes or rales. Normal effort    GI: BS +, non-tender, non-distended  Musculoskeletal: She exhibits no edema or tenderness.  Neurological: Much more alert and initiating. Dysarthric,but intelligible Motor: LUE/LLE 3+ to 4/5 proximal to distal -stable motor exam RUE/RLE: 4-5/5 proximal to distal Skin: Skin is warm and dry. She is not diaphoretic.  Psychiatric: Pleasant and cooperative  Assessment/Plan: 1. Functional deficits secondary to acute/ubacute infarcts in right mid corona radiata with adjacent punctate foci which require 3+ hours per day of interdisciplinary therapy in a  comprehensive inpatient rehab setting. Physiatrist is providing close team supervision and 24 hour management of active medical problems listed below. Physiatrist and rehab team continue to assess barriers to discharge/monitor patient progress toward functional and medical goals.  Function:  Bathing Bathing position   Position: Sitting EOB  Bathing parts Body parts bathed by patient: Right arm, Left arm, Chest, Abdomen, Front perineal area, Buttocks, Right upper leg, Left upper leg, Right lower leg, Left lower leg Body parts bathed by helper: Back  Bathing assist Assist Level: Supervision or verbal cues      Upper Body Dressing/Undressing Upper body dressing   What is the patient wearing?: Hospital gown     Pull over shirt/dress - Perfomed by patient: Thread/unthread right sleeve, Thread/unthread left sleeve, Put head through opening, Pull shirt over trunk          Upper body assist Assist Level: Supervision or verbal cues      Lower Body Dressing/Undressing Lower body dressing   What is the patient wearing?: Non-skid slipper socks Underwear - Performed by patient: Thread/unthread right underwear leg, Thread/unthread left underwear leg, Pull underwear up/down   Pants- Performed by patient: Thread/unthread right pants leg, Thread/unthread left pants leg, Pull pants up/down   Non-skid slipper socks- Performed by patient: Don/doff right sock, Don/doff left sock                    Lower body assist Assist for  lower body dressing: Supervision or verbal cues      Toileting Toileting Toileting activity did not occur: No continent bowel/bladder event Toileting steps completed by patient: Performs perineal hygiene, Adjust clothing prior to toileting Toileting steps completed by helper: Adjust clothing prior to toileting, Performs perineal hygiene, Adjust clothing after toileting Toileting Assistive Devices: Grab bar or rail  Toileting assist Assist level: Touching or  steadying assistance (Pt.75%)   Transfers Chair/bed transfer Chair/bed transfer activity did not occur: Safety/medical concerns Chair/bed transfer method: Stand pivot Chair/bed transfer assist level: Moderate assist (Pt 50 - 74%/lift or lower)(per Starlyn Skeans, NT report) Chair/bed transfer assistive device: Armrests     Locomotion Ambulation     Max distance: 100' Assist level: Supervision or verbal cues   Wheelchair   Type: Manual Max wheelchair distance: 130ft  Assist Level: Supervision or verbal cues  Cognition Comprehension Comprehension assist level: Follows complex conversation/direction with no assist  Expression Expression assist level: Expresses complex ideas: With no assist  Social Interaction Social Interaction assist level: Interacts appropriately with others - No medications needed.  Problem Solving Problem solving assist level: Solves complex problems: Recognizes & self-corrects  Memory Memory assist level: Complete Independence: No helper    Medical Problem List and Plan: 1. Unsteady gait, poor safety with impulsivity secondary to acute/early subacute infarct in right mid corona radiata with few adjacent punctate foci.   -increased cluster of infarcts in right CR/frontal WM on MRI this weekend  -Resume therapies as tolerated today   -Neurology recommending transesophageal echocardiogram and loop recorder   -Plavix and aspirin for stroke prophylaxis 2.  DVT Prophylaxis/Anticoagulation: Pharmaceutical: Lovenox 3. Pain Management: tylenol prn  4. Mood: LCSW to follow for evaluation and support.  5. Neuropsych: This patient is not fully capable of making decisions on her own behalf. 6. Skin/Wound Care: routine pressure relief. 7. Fluids/Electrolytes/Nutrition: .    -bun/cr improved to 23/1.33 today 4/17   -will dc IVF, push po 8. T2DM: Hgb 8.9 On 70/30 insulin bid--Monitor BS ac/hs and titrate as indicated   -improving control  -maintain 70/30 insulin at 32u  bid 9. Muscle spasms: Baclofen held due to lethargy.  No active spasms at present 10 HTN:   (HCTZ and Cozaar daily.)   BP holding steady, continue to hold medications for now 11. Hypothyroid: On supplement  12. Headaches: stable at present 13. Acute blood loss anemia  Hemoglobin 9.5 on 4/17  Cont to monitor 14. CKD   Creatinine 1.33 on 4/17  Encourage fluids  Continue to monitor   LOS (Days) 5 A FACE TO FACE EVALUATION WAS PERFORMED  Meredith Staggers 02/27/2018 7:56 AM

## 2018-02-27 NOTE — Progress Notes (Signed)
    [facility] has been requested to perform a transesophageal echocardiogram on [name] for stroke.  After careful review of history and examination, the risks and benefits of transesophageal echocardiogram have been explained including risks of esophageal damage, perforation (1:10,000 risk), bleeding, pharyngeal hematoma as well as other potential complications associated with conscious sedation including aspiration, arrhythmia, respiratory failure and death. Alternatives to treatment were discussed, questions were answered. Patient is willing to proceed.   Kerin Ransom, [place]  02/27/2018 5:01 PM

## 2018-02-27 NOTE — Progress Notes (Signed)
Physical Therapy Session Note  Patient Details  Name: [name] MRN: [id number] Date of Birth: 07/16/1951  Today's Date: 02/27/2018 PT Individual Time: 4196-2229 PT Individual Time Calculation (min): 60 min   Short Term Goals: Week 1:  PT Short Term Goal 1 (Week 1): STG =LTG due to ELOS   Skilled Therapeutic Interventions/Progress Updates:    Pt received in bed, agreeable to therapy. Pt w/ no c/o pain. Pt performs supine>sit EOB supervision and dons shoes with extended time. Pt ambulated w/o AD throughout unit and outside with supervision, focusing on high level dynamic gait and community ambulation. Pt required to ambulate over uneven surfaces, surface changes, up and down stairs, up and down incline and over curbs with supervision. Pt demonstrated increased unsteadiness as she fatigued, rest breaks taken prn. Pt educated to use railings and take breaks when fatigued upon discharge as she reintegrates into community mobility. Pt performed dynamic standing balance activity w/ ball toss to trampoline standing on foam to facilitate hip and ankle strategies, requiring steady assist for balance. Pt reports increased difficulty manipulating ball with LUE. Pt engaged in LUE task to match peg design while standing on foam to improve functional use of LUE and static standing balance. Pt required verbal cuing to correct peg design. At end of session, pt returned to bed, alarm on, and all needs within reach.   Therapy Documentation Precautions:  Precautions Precautions: Fall Precaution Comments: Mild Lt hemi Restrictions Weight Bearing Restrictions: No  See Function Navigator for Current Functional Status.   Therapy/Group: Individual Therapy  Caffie Damme 02/27/2018, 4:03 PM

## 2018-02-27 NOTE — Progress Notes (Signed)
Contacted cardiology for update on work up. Their schedule has been very full this week. They plan following up tomorrow or Friday as schedule allows.

## 2018-02-27 NOTE — Progress Notes (Signed)
Occupational Therapy Session Note  Patient Details  Name: [name] MRN: [id number] Date of Birth: 1951-07-10  Today's Date: 02/27/2018 OT Individual Time: 8916-9450 OT Individual Time Calculation (min): 70 min    Short Term Goals: Week 1:  OT Short Term Goal 1 (Week 1): STGs=LTGs due to ELOS  Skilled Therapeutic Interventions/Progress Updates:    Upon entering the room, pt seated in bed with HOB elevated and finishing breakfast. Pt is agreeable to OT intervention and reports no pain this session. Pt ambulating without use of AD and supervision to sink for bathing tasks while standing at sink. Pt preforming all tasks at overall supervision level. Pt ambulating to day room in same manner for familiar task of making coffee. Pt ambulating while holding onto coffee cup without spillage and pt utilized B UE's to fix coffee. Pt returning to room and seated in recliner chair at end of session. Chair alarm activated and all needs within reach.   Therapy Documentation Precautions:  Precautions Precautions: Fall Precaution Comments: Mild Lt hemi Restrictions Weight Bearing Restrictions: No General:   Vital Signs:  Pain: Pain Assessment Pain Scale: 0-10 Pain Score: 0-No pain ADL: ADL ADL Comments: Please see functional navigator for ADL status  See Function Navigator for Current Functional Status.   Therapy/Group: Individual Therapy  [name] 02/27/2018, 12:28 PM

## 2018-02-27 NOTE — Progress Notes (Addendum)
[facility] PHYSICAL MEDICINE & REHABILITATION     PROGRESS NOTE  Subjective/Complaints:  Had a good day with therapy. No weakness, dizziness, shakes. Did have problems sleeping last night.    ROS: Patient denies fever, rash, sore throat, blurred vision, nausea, vomiting, diarrhea, cough, shortness of breath or chest pain, joint or back pain, headache, or mood change.   Objective: Vital Signs: Blood pressure (!) 141/58, pulse 79, temperature 98.7 F (37.1 C), temperature source Oral, resp. rate 18, SpO2 97 %. No results found. Recent Labs    02/27/18 0534  WBC 4.7  HGB 9.5*  HCT 28.9*  PLT 273   Recent Labs    02/26/18 0616 02/27/18 0534  NA 140 139  K 4.1 3.8  CL 108 110  GLUCOSE 203* 145*  BUN 25* 23*  CREATININE 1.32* 1.33*  CALCIUM 9.2 9.1   CBG (last 3)  Recent Labs    02/26/18 1648 02/26/18 2057 02/27/18 0639  GLUCAP 197* 181* 135*    Wt Readings from Last 3 Encounters:  02/16/18 76 kg (167 lb 8.8 oz)  02/15/18 74.6 kg (164 lb 6.4 oz)  02/06/18 76.2 kg (168 lb 1.6 oz)    Physical Exam:  BP (!) 141/58 (BP Location: Right Arm)   Pulse 79   Temp 98.7 F (37.1 C) (Oral)   Resp 18   SpO2 97%  Constitutional: No distress . Vital signs reviewed. HEENT: EOMI, oral membranes moist Cardiovascular: RRR without murmur. No JVD    Respiratory: CTA Bilaterally without wheezes or rales. Normal effort    GI: BS +, non-tender, non-distended  Musculoskeletal: She exhibits no edema or tenderness.  Neurological: Much more alert and initiating. Dysarthric,but intelligible Motor: LUE/LLE 3+ to 4/5 proximal to distal -stable motor exam RUE/RLE: 4-5/5 proximal to distal Skin: Skin is warm and dry. She is not diaphoretic.  Psychiatric: Pleasant and cooperative  Assessment/Plan: 1. Functional deficits secondary to acute/ubacute infarcts in right mid corona radiata with adjacent punctate foci which require 3+ hours per day of interdisciplinary therapy in a  comprehensive inpatient rehab setting. Physiatrist is providing close team supervision and 24 hour management of active medical problems listed below. Physiatrist and rehab team continue to assess barriers to discharge/monitor patient progress toward functional and medical goals.  Function:  Bathing Bathing position   Position: Sitting EOB  Bathing parts Body parts bathed by patient: Right arm, Left arm, Chest, Abdomen, Front perineal area, Buttocks, Right upper leg, Left upper leg, Right lower leg, Left lower leg Body parts bathed by helper: Back  Bathing assist Assist Level: Supervision or verbal cues      Upper Body Dressing/Undressing Upper body dressing   What is the patient wearing?: Hospital gown     Pull over shirt/dress - Perfomed by patient: Thread/unthread right sleeve, Thread/unthread left sleeve, Put head through opening, Pull shirt over trunk          Upper body assist Assist Level: Supervision or verbal cues      Lower Body Dressing/Undressing Lower body dressing   What is the patient wearing?: Non-skid slipper socks Underwear - Performed by patient: Thread/unthread right underwear leg, Thread/unthread left underwear leg, Pull underwear up/down   Pants- Performed by patient: Thread/unthread right pants leg, Thread/unthread left pants leg, Pull pants up/down   Non-skid slipper socks- Performed by patient: Don/doff right sock, Don/doff left sock                    Lower body assist Assist for  lower body dressing: Supervision or verbal cues      Toileting Toileting Toileting activity did not occur: No continent bowel/bladder event Toileting steps completed by patient: Performs perineal hygiene, Adjust clothing prior to toileting Toileting steps completed by helper: Adjust clothing prior to toileting, Performs perineal hygiene, Adjust clothing after toileting Toileting Assistive Devices: Grab bar or rail  Toileting assist Assist level: Touching or  steadying assistance (Pt.75%)   Transfers Chair/bed transfer Chair/bed transfer activity did not occur: Safety/medical concerns Chair/bed transfer method: Stand pivot Chair/bed transfer assist level: Moderate assist (Pt 50 - 74%/lift or lower)(per Starlyn Skeans, NT report) Chair/bed transfer assistive device: Armrests     Locomotion Ambulation     Max distance: 100' Assist level: Supervision or verbal cues   Wheelchair   Type: Manual Max wheelchair distance: 114ft  Assist Level: Supervision or verbal cues  Cognition Comprehension Comprehension assist level: Follows complex conversation/direction with no assist  Expression Expression assist level: Expresses complex ideas: With no assist  Social Interaction Social Interaction assist level: Interacts appropriately with others - No medications needed.  Problem Solving Problem solving assist level: Solves complex problems: Recognizes & self-corrects  Memory Memory assist level: Complete Independence: No helper    Medical Problem List and Plan: 1. Unsteady gait, poor safety with impulsivity secondary to acute/early subacute infarct in right mid corona radiata with few adjacent punctate foci.   -increased cluster of infarcts in right CR/frontal WM on MRI this weekend  -Resume therapies as tolerated today   -Neurology recommending transesophageal echocardiogram and loop recorder   -Plavix and aspirin for stroke prophylaxis 2.  DVT Prophylaxis/Anticoagulation: Pharmaceutical: Lovenox 3. Pain Management: tylenol prn  4. Mood: LCSW to follow for evaluation and support.  5. Neuropsych: This patient is not fully capable of making decisions on her own behalf. 6. Skin/Wound Care: routine pressure relief. 7. Fluids/Electrolytes/Nutrition: .    -bun/cr improved to 23/1.33 today 4/17   -will dc IVF, push po 8. T2DM: Hgb 8.9 On 70/30 insulin bid--Monitor BS ac/hs and titrate as indicated   -improving control  -maintain 70/30 insulin at 32u  bid 9. Muscle spasms: Baclofen held due to lethargy.  No active spasms at present 10 HTN:   (HCTZ and Cozaar daily.)   BP holding steady, continue to hold medications for now 11. Hypothyroid: On supplement  12. Headaches: stable at present 13. Acute blood loss anemia  Hemoglobin 9.5 on 4/17  Cont to monitor 14. CKD   Creatinine 1.33 on 4/17  Encourage fluids  Continue to monitor   LOS (Days) 5 A FACE TO FACE EVALUATION WAS PERFORMED  Meredith Staggers 02/27/2018 7:56 AM

## 2018-02-27 NOTE — Progress Notes (Signed)
Speech Language Pathology Daily Session Note  Patient Details  Name: [name] MRN: [id number] Date of Birth: [birth year]  Today's Date: 02/27/2018 SLP Individual Time: 0830-0930 SLP Individual Time Calculation (min): 60 min  Short Term Goals: Week 1: SLP Short Term Goal 1 (Week 1): Pt will demonstrate sustained attention in functional task for 20 minutes with supervision A verbal cues for redirection. SLP Short Term Goal 2 (Week 1): Pt will recall novel, daily information with min A verbal cues for use of external aid. SLP Short Term Goal 3 (Week 1): Pt will demonstrate functional problem solving for basic and familiar (mildly complex) tasks eith min A verbal cues. SLP Short Term Goal 4 (Week 1): Pt will self-monitor functional and verbal errors in functional tasks with mod A verbal cues. SLP Short Term Goal 5 (Week 1): Pt will demonstrate safety awareness due to CVA deficits in verbal and functional tasks with min A verbal cues.  Skilled Therapeutic Interventions: Skilled treatment session focused on cognitive goals. SLP facilitated session by providing Supervision verbal cues for pt to recall current medications and their function. Pt demonstrated problem solving during a complex medication management task with Supervision verbal cues faded to Mod I. Pt able to self-monitor and correct errors during the task with Supervision verbal cues. Pt also demonstrated safety awareness due to CVA deficits with Mod I during informal conversations. Pt left upright in recliner with all needs within reach. Continue with current plan of care.      Function:  Cognition Comprehension Comprehension assist level: Follows complex conversation/direction with extra time/assistive device  Expression   Expression assist level: Expresses complex ideas: With extra time/assistive device  Social Interaction Social Interaction assist level: Interacts appropriately with others with medication or extra time  (anti-anxiety, antidepressant).  Problem Solving Problem solving assist level: Solves complex problems: With extra time  Memory Memory assist level: Recognizes or recalls 90% of the time/requires cueing < 10% of the time    Pain Pain Assessment Pain Scale: 0-10 Pain Score: 0-No pain  Therapy/Group: Individual Therapy  Meredeth Ide  SLP - Student 02/27/2018, 11:05 AM

## 2018-02-27 NOTE — Consult Note (Signed)
Neuropsychological Consultation   Patient:   [name]   DOB:   1951-04-10  MR Number:  [id number]  Location:  [facility] [id number] [place] [address] Dept: [phone] FVC: [phone]           Date of Service:   02/27/2018  Start Time:   9:30 End Time:   10:30  Provider/Observer:  Ilean Skill, Psy.D.       Clinical Neuropsychologist       Billing Code/Service: [phone] 4 Units  Chief Complaint:    [name] is a 67 year old female with history of T2DM, Gerd, PVD, CVA x2.  Admitted on 02/16/2018 with one week history of intermittent left sided weakness and possible episodes of generalized seizures.  There was indication of witnessed seizure with gaze to left.  Treated with dilantin and Keppra.  MRI indicated right basal ganglia infarct.  Patient reports that insurance denied to pay for some of medicines and they were not filled by pharmacy 3 weeks prior to admission.  Dr. Leonie Man felt that stroke due to small vessel disease.  Patient continued with intermittent seizure like activity and Depakote and Keppra increased.  At least some of the motor movement in LUE felt due to muscle spasms.  Dr. Erlinda Hong, with further information felt that symptoms due to muscle spasm and weakness versus some volitional component.  Patient has been doing well in therapies and remains motivated with some mild anxiety.  Reason for Service:  Kynzi Levay was referred for neuropsychological consult due to coping and adjustment issues.  Below is the HPI for the current admission.  HPI:  [name] is a 67 year old female with history of T2DM, GERD, PVD, CVA X 2 who was admitted on 02/16/18 with one week history of intermittent left sided weakness as well as multiple episodes of generalized seizures. History taken from chart review and patient. She had witnessed seizure with gaze to left and was loaded with dilantin.  She was started on Keppra for management of new onset seizures. Marland Kitchen MRI brain reviewed, showing right basal ganglia infarct.  Per report MRI/MRA brain done revealing acute/early subacute infarct in right mid corona radiata with few adjacent punctate foci.  2D echo done revealing #F 60-65% with grade 1 diastolic dysfunction.  Carotid dopplers were negative for ICA stenosis.  Family reported that medications not filled by pharmacy 3 weeks PTA. Dr. Leonie Man felt that stroke due to small vessel disease--to continue Plavix and Pletal.  She has had intermittent seizure activity (head turn to the left with left arm tonic flexion) therefore Depakote added and Keppra was increased to 1000 mg bid on 04/08. Pulling sensation LUE felt to be due to muscle spasms. But continued to have seizure type activity therefore was placed on long term monitor. She continued to have symptoms but no seizure activity noted and Dr. Erlinda Hong felt that symptoms due to muscle spasms, weakness versus some volitional component.  AEDs were discontinued and she was started on baclofen to help manage symptoms.   Current Status:  Today, the patient was well oriented and STM appeared to be intact.  She was able to follow commands and highter cognitive functions appear to be at baseline.  Patient's mood is good and she is highly motivated to actively participate in all therapies.  She reports that while she was stressed at first due to now her 3rd stroke, she sees improvements with  therapy.  Behavioral Observation: DEBAR PLATE  presents as a 67 y.o.-year-old Right African American Female who appeared her stated age. her dress was Appropriate and she was Well Groomed and her manners were Appropriate to the situation.  her participation was indicative of Appropriate and Attentive behaviors.  There were  physical disabilities noted with LUE.  Patient reports some weakness in left leg but much better than left arm.  she displayed an appropriate level of  cooperation and motivation.     Interactions:    Active Appropriate and Attentive  Attention:   within normal limits and attention span and concentration were age appropriate  Memory:   within normal limits; recent and remote memory intact  Visuo-spatial:  not examined  Speech (Volume):  low  Speech:   normal; normal  Thought Process:  Coherent and Relevant  Though Content:  WNL; not suicidal and not homicidal  Orientation:   person, place, time/date and situation  Judgment:   Good  Planning:   Good  Affect:    Appropriate  Mood:    Anxious  Insight:   Good  Intelligence:   normal  Medical History:   Past Medical History:  Diagnosis Date  . Anemia   . Arteriosclerotic cardiovascular disease   . Claudication in peripheral vascular disease ([facility])   . CVA (cerebral vascular accident) ([place]) 01/2011   affected left side  . Diabetes mellitus 2000  . GERD (gastroesophageal reflux disease)   . Hyperlipidemia   . Hypertension   . Hypothyroidism   . Renal insufficiency   . Vitamin D deficiency    Psychiatric History:  No prior history of psychiatric history  Family Med/Psych History:  Family History  Problem Relation Age of Onset  . Diabetes Brother   . Prostate cancer Brother   . Diabetes Mother   . Diabetes Father   . Diabetes Sister   . Esophageal cancer Neg Hx   . Rectal cancer Neg Hx   . Stomach cancer Neg Hx   . Colon cancer Neg Hx   . Breast cancer Neg Hx     Risk of Suicide/Violence: low Patient denies SI or HI  Impression/DX:  [name] is a 67 year old female with history of T2DM, Gerd, PVD, CVA x2.  Admitted on 02/16/2018 with one week history of intermittent left sided weakness and possible episodes of generalized seizures.  There was indication of witnessed seizure with gaze to left.  Treated with dilantin and Keppra.  MRI indicated right basal ganglia infarct.  Patient reports that insurance denied to pay for some of medicines and they were not  filled by pharmacy 3 weeks prior to admission.  Dr. Leonie Man felt that stroke due to small vessel disease.  Patient continued with intermittent seizure like activity and Depakote and Keppra increased.  At least some of the motor movement in LUE felt due to muscle spasms.  Dr. Erlinda Hong, with further information felt that symptoms due to muscle spasm and weakness versus some volitional component.  Patient has been doing well in therapies and remains motivated with some mild anxiety.  Today, the patient was well oriented and STM appeared to be intact.  She was able to follow commands and highter cognitive functions appear to be at baseline.  Patient's mood is good and she is highly motivated to actively participate in all therapies.  She reports that while she was stressed at first due to now her 3rd stroke, she sees improvements with therapy.  Disposition/Plan:  Patient appears to be coping better and adjusting to current hospital course.  If the patient develops more anxiety will be available to further consult but the patient does appear to be doing well from a neuropsychological standpoint.  Diagnosis:   Basal Ganglia Stroke        Electronically Signed   _______________________ Ilean Skill, Psy.D.

## 2018-02-28 ENCOUNTER — Encounter (HOSPITAL_COMMUNITY): Payer: Self-pay | Admitting: *Deleted

## 2018-02-28 ENCOUNTER — Ambulatory Visit (HOSPITAL_COMMUNITY): Admission: RE | Admit: 2018-02-28 | Payer: Medicare HMO | Source: Ambulatory Visit | Admitting: Cardiology

## 2018-02-28 ENCOUNTER — Inpatient Hospital Stay (HOSPITAL_COMMUNITY): Payer: Medicaid Other | Admitting: Physical Therapy

## 2018-02-28 ENCOUNTER — Inpatient Hospital Stay (HOSPITAL_COMMUNITY): Payer: Medicare HMO

## 2018-02-28 ENCOUNTER — Encounter (HOSPITAL_COMMUNITY)
Admission: RE | Disposition: A | Discharge status: Home Health | Payer: Self-pay | Source: Intra-hospital | Attending: Physical Medicine & Rehabilitation

## 2018-02-28 ENCOUNTER — Inpatient Hospital Stay (HOSPITAL_COMMUNITY): Payer: Medicaid Other | Admitting: Occupational Therapy

## 2018-02-28 ENCOUNTER — Inpatient Hospital Stay (HOSPITAL_COMMUNITY): Payer: Medicaid Other | Admitting: Speech Pathology

## 2018-02-28 DIAGNOSIS — I6389 Other cerebral infarction: Secondary | ICD-10-CM

## 2018-02-28 HISTORY — PX: TEE WITHOUT CARDIOVERSION: SHX5443

## 2018-02-28 LAB — GLUCOSE, CAPILLARY
GLUCOSE-CAPILLARY: 147 mg/dL — AB (ref 65–99)
GLUCOSE-CAPILLARY: 145 mg/dL — AB (ref 65–99)
GLUCOSE-CAPILLARY: 200 mg/dL — AB (ref 65–99)
Glucose-Capillary: 151 mg/dL — ABNORMAL HIGH (ref 65–99)
Glucose-Capillary: 81 mg/dL (ref 65–99)

## 2018-02-28 SURGERY — ECHOCARDIOGRAM, TRANSESOPHAGEAL
Anesthesia: Moderate Sedation

## 2018-02-28 MED ORDER — MIDAZOLAM HCL 5 MG/ML IJ SOLN
INTRAMUSCULAR | Status: AC
  Filled 2018-02-28: qty 1

## 2018-02-28 MED ORDER — DIPHENHYDRAMINE HCL 50 MG/ML IJ SOLN
INTRAMUSCULAR | Status: AC
  Filled 2018-02-28: qty 1

## 2018-02-28 MED ORDER — FENTANYL CITRATE (PF) 100 MCG/2ML IJ SOLN
INTRAMUSCULAR | Status: AC
  Filled 2018-02-28: qty 2

## 2018-02-28 MED ORDER — SODIUM CHLORIDE 0.9 % IV SOLN
INTRAVENOUS | Status: DC
  Administered 2018-02-28: 500 mL via INTRAVENOUS

## 2018-02-28 MED ORDER — BUTAMBEN-TETRACAINE-BENZOCAINE 2-2-14 % EX AERO
INHALATION_SPRAY | CUTANEOUS | Status: DC | PRN
  Administered 2018-02-28: 2 via TOPICAL

## 2018-02-28 MED ORDER — MIDAZOLAM HCL 10 MG/2ML IJ SOLN
INTRAMUSCULAR | Status: DC | PRN
  Administered 2018-02-28: 1 mg via INTRAVENOUS
  Administered 2018-02-28: 2 mg via INTRAVENOUS

## 2018-02-28 MED ORDER — FENTANYL CITRATE (PF) 100 MCG/2ML IJ SOLN
INTRAMUSCULAR | Status: DC | PRN
  Administered 2018-02-28: 25 ug via INTRAVENOUS
  Administered 2018-02-28: 12.5 ug via INTRAVENOUS

## 2018-02-28 MED ORDER — LIDOCAINE VISCOUS 2 % MT SOLN
OROMUCOSAL | Status: AC
  Filled 2018-02-28: qty 15

## 2018-02-28 MED ORDER — MIDAZOLAM HCL 5 MG/ML IJ SOLN
INTRAMUSCULAR | Status: AC
Start: 2018-02-28 — End: 2018-02-28
  Filled 2018-02-28: qty 1

## 2018-02-28 NOTE — CV Procedure (Signed)
   Transesophageal Echocardiogram  Indications: Stroke  Time out performed  During this procedure the patient is administered a total of Versed 3 mg and Fentanyl 25 mcg to achieve and maintain moderate conscious sedation.  The patient's heart rate, blood pressure, and oxygen saturation are monitored continuously during the procedure. The period of conscious sedation is 25 minutes, of which I was present face-to-face 100% of this time.  Findings:  Left Ventricle: Normal, EF 60%   Mitral Valve: Normal  Aortic Valve: Mildly calcified (no AR, no AS)  Tricuspid Valve: Trivial TR  Left Atrium: Normal, no LAA thrombus   Bubble Contrast Study: Normal, no shunt  Candee Furbish, MD

## 2018-02-28 NOTE — Progress Notes (Signed)
EP service called for evaluation/consideration of loop implant.  Patient will need to be evaluated/implanted once/upon discharge from CIR services.  Please call EP service when discharge date/timing is known and we will plan for this.  Thanks-you Tommye Standard, PA-C

## 2018-02-28 NOTE — Progress Notes (Signed)
Nutrition Brief Note  RD consulted for ordering nourishment snacks. Current diet order is carbohydrate modfied, patient is consuming approximately 100% of meals at this time. Appetite good with no other difficulties. RD to order HD snack. Pt food perferences taken. Labs and medications reviewed. No further nutrition interventions warranted at this time. If nutrition issues arise, please consult RD.   Corrin Parker, MS, RD, LDN Pager # [phone] After hours/ weekend pager # [phone]

## 2018-02-28 NOTE — Progress Notes (Addendum)
Social Work Patient ID: [name], female   DOB: March 02, 1951, 67 y.o.   MRN: [id number]  Have reviewed team conference with pt and daughter, [name] (via phone).  Both aware of targeted d/c date of 4/23 and hope to reach more of a mod ind goals.  Pt and daughter confirm that pt does not have anyone who can provide 24/7 supervision.  I will make a referral for PCS aide, however, this would only provide 2-4 hours per day of aide.  She continues to make progress with cognition and hope this will continue.  Mclain Freer, LCSW

## 2018-02-28 NOTE — Progress Notes (Addendum)
PHYSICAL MEDICINE & REHABILITATION     PROGRESS NOTE  Subjective/Complaints:  In good spirits. Hungry. Awaiting procedure today  ROS: Patient denies fever, rash, sore throat, blurred vision, nausea, vomiting, diarrhea, cough, shortness of breath or chest pain, joint or back pain, headache, or mood change. .   Objective: Vital Signs: Blood pressure (!) 148/54, pulse 70, temperature 97.9 F (36.6 C), temperature source Oral, resp. rate 18, SpO2 97 %. No results found. Recent Labs    02/27/18 0534  WBC 4.7  HGB 9.5*  HCT 28.9*  PLT 273   Recent Labs    02/26/18 0616 02/27/18 0534  NA 140 139  K 4.1 3.8  CL 108 110  GLUCOSE 203* 145*  BUN 25* 23*  CREATININE 1.32* 1.33*  CALCIUM 9.2 9.1   CBG (last 3)  Recent Labs    02/27/18 2106 02/28/18 0650 02/28/18 0825  GLUCAP 136* 151* 147*    Wt Readings from Last 3 Encounters:  02/16/18 76 kg (167 lb 8.8 oz)  02/15/18 74.6 kg (164 lb 6.4 oz)  02/06/18 76.2 kg (168 lb 1.6 oz)    Physical Exam:  BP (!) 148/54   Pulse 70   Temp 97.9 F (36.6 C) (Oral)   Resp 18   SpO2 97%  Constitutional: No distress . Vital signs reviewed. HEENT: EOMI, oral membranes moist Cardiovascular: RRR without murmur. No JVD    Respiratory: CTA Bilaterally without wheezes or rales. Normal effort    GI: BS +, non-tender, non-distended  Musculoskeletal: She exhibits no edema or tenderness.  Neurological: Much more alert and initiating. Speech clear Motor: LUE/LLE 3+ to 4/5 proximal to distal   RUE/RLE: 4-5/5 proximal to distal--stable exam Skin: Skin is warm and dry. She is not diaphoretic.  Psychiatric: Pleasant and cooperative  Assessment/Plan: 1. 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 Physiatrist is providing close team supervision and 24 hour  management of active medical problems listed below. Physiatrist and rehab team continue to assess barriers to discharge/monitor patient progress toward functional and medical goals.  Function:  Bathing Bathing position   Position: Wheelchair/chair at sink  Bathing parts Body parts bathed by patient: Right arm, Left arm, Chest, Abdomen, Front perineal area, Buttocks, Right upper leg, Left upper leg, Right lower leg, Left lower leg Body parts bathed by helper: Back  Bathing assist Assist Level: Supervision or verbal cues      Upper Body Dressing/Undressing Upper body dressing   What is the patient wearing?: Hospital gown     Pull over shirt/dress - Perfomed by patient: Thread/unthread right sleeve, Thread/unthread left sleeve, Put head through opening, Pull shirt over trunk          Upper body assist Assist Level: Supervision or verbal cues      Lower Body Dressing/Undressing Lower body dressing   What is the patient wearing?: Non-skid slipper socks, Hospital Gown Underwear - Performed by patient: Thread/unthread right underwear leg, Thread/unthread left underwear leg, Pull underwear up/down   Pants- Performed by patient: Thread/unthread right pants leg, Thread/unthread left pants leg, Pull pants up/down   Non-skid slipper socks- Performed by patient: Don/doff right sock, Don/doff left sock                    Lower body assist Assist for lower body dressing: Supervision or verbal cues  Toileting Toileting Toileting activity did not occur: No continent bowel/bladder event Toileting steps completed by patient: Performs perineal hygiene, Adjust clothing prior to toileting Toileting steps completed by helper: Adjust clothing prior to toileting, Performs perineal hygiene, Adjust clothing after toileting Toileting Assistive Devices: Grab bar or rail  Toileting assist Assist level: Touching or steadying assistance (Pt.75%)   Transfers Chair/bed transfer Chair/bed  transfer activity did not occur: Safety/medical concerns Chair/bed transfer method: Stand pivot Chair/bed transfer assist level: Supervision or verbal cues Chair/bed transfer assistive device: Armrests     Locomotion Ambulation     Max distance: >150 ft Assist level: Supervision or verbal cues   Wheelchair   Type: Manual Max wheelchair distance: 150ft  Assist Level: Supervision or verbal cues  Cognition Comprehension Comprehension assist level: Follows complex conversation/direction with extra time/assistive device  Expression Expression assist level: Expresses complex ideas: With extra time/assistive device  Social Interaction Social Interaction assist level: Interacts appropriately with others with medication or extra time (anti-anxiety, antidepressant).  Problem Solving Problem solving assist level: Solves complex problems: With extra time  Memory Memory assist level: Recognizes or recalls 90% of the time/requires cueing < 10% of the time    Medical Problem List and Plan: 1. Unsteady gait, poor safety with impulsivity secondary to acute/early subacute infarct in right mid corona radiata with few adjacent punctate foci.   -increased cluster of infarcts in right CR/frontal WM on MRI last weekend   -TEE today, loop recorder at discharge   -Plavix and aspirin for stroke prophylaxis 2.  DVT Prophylaxis/Anticoagulation: Pharmaceutical: Lovenox 3. Pain Management: tylenol prn  4. Mood: LCSW to follow for evaluation and support.  5. Neuropsych: This patient is not fully capable of making decisions on her own behalf. 6. Skin/Wound Care: routine pressure relief. 7. Fluids/Electrolytes/Nutrition: .    -bun/cr improved to 23/1.33 today 4/17   -will dc IVF, push po 8. T2DM: Hgb 8.9 On 70/30 insulin bid--Monitor BS ac/hs and titrate as indicated   -improved control  -maintain 70/30 insulin at 32u bid 9. Muscle spasms: Baclofen held due to lethargy.  No active spasms at present 10 HTN:    (HCTZ and Cozaar daily.)   BP holding steady, continue to hold medications for now 11. Hypothyroid: On supplement  12. Headaches: improved 13. Acute blood loss anemia  Hemoglobin 9.5 on 4/17  Cont to monitor 14. CKD   Creatinine 1.33 on 4/17  Encourage fluids  Continue to monitor   LOS (Days) 6 A FACE TO FACE EVALUATION WAS PERFORMED  Meredith Staggers 02/28/2018 9:24 AM

## 2018-02-28 NOTE — Progress Notes (Signed)
Physical Therapy Note  Patient Details  Name: [name] MRN: [id number] Date of Birth: 1951-06-16 Today's Date: 02/28/2018    Pt missed 75 minutes skilled PT due to being off unit for procedure   Laasia Arcos 02/28/2018, 1:59 PM

## 2018-02-28 NOTE — Progress Notes (Signed)
Speech Language Pathology Daily Session Note  Patient Details  Name: [name] MRN: [id number] Date of Birth: Jun 12, 1951  Today's Date: 02/28/2018 SLP Individual Time: 1105-1200 SLP Individual Time Calculation (min): 55 min  Short Term Goals: Week 1: SLP Short Term Goal 1 (Week 1): Pt will demonstrate sustained attention in functional task for 20 minutes with supervision A verbal cues for redirection. SLP Short Term Goal 2 (Week 1): Pt will recall novel, daily information with min A verbal cues for use of external aid. SLP Short Term Goal 3 (Week 1): Pt will demonstrate functional problem solving for basic and familiar (mildly complex) tasks eith min A verbal cues. SLP Short Term Goal 4 (Week 1): Pt will self-monitor functional and verbal errors in functional tasks with mod A verbal cues. SLP Short Term Goal 5 (Week 1): Pt will demonstrate safety awareness due to CVA deficits in verbal and functional tasks with min A verbal cues.  Skilled Therapeutic Interventions: Skilled treatment session focused on cognitive goals. SLP facilitated session by providing Supervision verbal cues for problem solving during a complex calendar organization and management task. Pt required Mod A verbal cues to self-monitor and correct errors throughout task. Pt also demonstrated sustained attention for ~45 minutes with Mod I. Pt left upright in wheelchair with quick release belt on and all needs within reach. Continue with current plan of care.      Function:  Cognition Comprehension Comprehension assist level: Follows complex conversation/direction with extra time/assistive device  Expression   Expression assist level: Expresses complex ideas: With extra time/assistive device  Social Interaction Social Interaction assist level: Interacts appropriately with others with medication or extra time (anti-anxiety, antidepressant).  Problem Solving Problem solving assist level: Solves complex 90% of the time/cues <  10% of the time  Memory Memory assist level: Recognizes or recalls 90% of the time/requires cueing < 10% of the time    Pain Pain Assessment Pain Scale: 0-10 Pain Score: 0-No pain  Therapy/Group: Individual Therapy  Meredeth Ide  SLP - Student 02/28/2018, 1:08 PM

## 2018-02-28 NOTE — Interval H&P Note (Signed)
History and Physical Interval Note:  02/28/2018 8:31 AM  [name]  has presented today for surgery, with the diagnosis of stroke  The various methods of treatment have been discussed with the patient and family. After consideration of risks, benefits and other options for treatment, the patient has consented to  Procedure(s): TRANSESOPHAGEAL ECHOCARDIOGRAM (TEE) (N/A) as a surgical intervention .  The patient's history has been reviewed, patient examined, no change in status, stable for surgery.  I have reviewed the patient's chart and labs.  Questions were answered to the patient's satisfaction.     [employer]

## 2018-02-28 NOTE — Patient Care Conference (Signed)
Inpatient RehabilitationTeam Conference and Plan of Care Update Date: 02/26/2018   Time: 2:35 PM    Patient Name: [name]      Medical Record Number: [id number]  Date of Birth: Jun 13, 1951 Sex: Female         Room/Bed: 4W10C/4W10C-01 Payor Info: Payor: MEDICAID [place] / Plan: MEDICAID [place] ACCESS / Product Type: *No Product type* /    Admitting Diagnosis: Rt CVA  Admit Date/Time:  02/22/2018  6:23 PM Admission Comments: No comment available   Primary Diagnosis:  <principal problem not specified> Principal Problem: <principal problem not specified>  Patient Active Problem List   Diagnosis Date Noted  . Labile blood pressure   . Acute confusional state   . Lethargy   . Stage 3 chronic kidney disease ([place])   . Acute blood loss anemia   . Essential hypertension   . Poorly controlled type 2 diabetes mellitus ([place])   . 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 Muscle spasm   . Vascular headache   . Seizure ([place])   . CVA (cerebral vascular accident) ([place]) 02/16/2018  . Left arm weakness 02/07/2018  . Gingivitis 10/24/2017  . Dysphagia 01/16/2017  . GERD (gastroesophageal reflux disease) 06/01/2015  . Normocytic anemia 06/01/2015  . Healthcare maintenance 06/01/2015  . Diastolic dysfunction 50/93/2671  . Vitamin D deficiency 06/01/2015  . CKD (chronic kidney disease), stage III ([place]) 03/01/2014  . UTI (urinary tract infection) 03/01/2014  . PVD (peripheral vascular disease) ([place]) 06/18/2013  . Hypothyroidism 01/10/2007  . Hyperlipidemia 01/10/2007  . History of tobacco use 01/10/2007  . HYPERTENSION, BENIGN SYSTEMIC 01/10/2007  . History of CVA (cerebrovascular accident) 01/10/2007  . Diabetes mellitus with stage 3 chronic kidney disease ([place]) 01/10/1989    Expected Discharge Date: Expected Discharge Date: 03/05/18  Team Members Present: Physician leading conference: Dr. Alger Simons Social Worker Present: Lennart Pall, LCSW Nurse  Present: Frances Maywood, RN PT Present: Roderic Ovens, PT OT Present: Simonne Come, OT SLP Present: Weston Anna, SLP PPS Coordinator present : Daiva Nakayama, RN, CRRN     Current Status/Progress Goal Weekly Team Focus  Medical   Admitted with right corona radiata infarct.  Patient's course complicated by seizure as well as extension of stroke.  Perhaps encephalopathic from medications as well.  Showing improvement today  Maximize level of arousal and participation with therapy  Further neurological workup for etiology of stroke including transesophageal echocardiogram and loop recorder placement   Bowel/Bladder   Continent of bowel/bladder. LBM 02/24/18  Remain continent with min assist  Assess bowel/bladder function q shift and as needed   Swallow/Nutrition/ Hydration             ADL's   Steady assist bathing, dressing, toileting, and stand pivot bathroom transfers.   Mod I, may need to be downgraded due to mentation changes s/p stroke extension 4/14  Pt/family education, balance, cognitive remediation, Lt NMR, functional transfers    Mobility   supervision for gait, balance and mobility  mod I overall   family ed, d/c planning, balance   Communication             Safety/Cognition/ Behavioral Observations  Supervision-Min A  Supervision  problem solving, recall, attention and awareness    Pain   No complain of pain  <2  Assess and treat pain q shift and as needed   Skin   no skin issues noted  Skin to be free of breakdown/infection with mod I  Assess skin q shift and as needed    Rehab Goals Patient on target to meet rehab goals: Yes *See Care Plan and progress notes for long and short-term goals.     Barriers to Discharge  Current Status/Progress Possible Resolutions Date Resolved   Physician    Medical stability        Continue management of medical conditions as above      Nursing                  PT                    OT Lack of/limited family support                 SLP                SW Decreased caregiver support;Lack of/limited family support              Discharge Planning/Teaching Needs:  Plan still under discussion.  Pt lives alone and daughter uncertain how 48/8 supervision could be provided - will keep team posted on plans.  Teaching to be planned prior to d/c.   Team Discussion:  Plan per neurology for TEE and loop recorder per neurology.  With therapy, pt is currently supervision overall and concerned that she may need 24/7 supervision still at d/c.  Plan to go a little longer on ELOS to allow for maximum recovery with hopes to avoid need for placement.  Revisions to Treatment Plan:  None    Continued Need for Acute Rehabilitation Level of Care: The patient requires daily medical management by a physician with specialized training in physical medicine and rehabilitation for the following conditions: Daily direction of a multidisciplinary physical rehabilitation program to ensure safe treatment while eliciting the highest outcome that is of practical value to the patient.: Yes Daily medical management of patient stability for increased activity during participation in an intensive rehabilitation regime.: Yes Daily analysis of laboratory values and/or radiology reports with any subsequent need for medication adjustment of medical intervention for : Neurological problems;Blood pressure problems;Diabetes problems  Devera Englander 02/28/2018, 1:53 PM

## 2018-02-28 NOTE — Progress Notes (Signed)
Occupational Therapy Session Note  Patient Details  Name: [name] MRN: [id number] Date of Birth: 31-Jul-1951  Today's Date: 02/28/2018 OT Individual Time: 6712-4580 OT Individual Time Calculation (min): 73 min   Short Term Goals: Week 1:  OT Short Term Goal 1 (Week 1): STGs=LTGs due to ELOS  Skilled Therapeutic Interventions/Progress Updates:    Pt greeted sitting in wc and agreeable to OT, requesting to shower. Pt ambulated to the bathroom with min guard/supervision assist and transferred onto toilet. Pt voided bowel and bladder successfully, completed 3/3 toileting steps with supervision.  Pt then ambulated to the shower and completed bathing sit<>stand with overall supervision. Educated pt on safety awareness and modified techniques to improve safety within BADL tasks. Dressing completed sitting at wc with set-up A. Pt ambulated to dayroom with min guard/supervision assist. Worked on standing balance/endurance with Wii bowling activity. Pt then returned to room and left seated in wc with safety belt on and nurse present.   Therapy Documentation Precautions:  Precautions Precautions: Fall Precaution Comments: Mild Lt hemi Restrictions Weight Bearing Restrictions: No Pain: Pain Assessment Pain Score: 0-No pain ADL: ADL ADL Comments: Please see functional navigator for ADL status  See Function Navigator for Current Functional Status.   Therapy/Group: Individual Therapy  Valma Cava 02/28/2018, 2:52 PM

## 2018-03-01 ENCOUNTER — Inpatient Hospital Stay (HOSPITAL_COMMUNITY): Payer: Medicare HMO | Admitting: Speech Pathology

## 2018-03-01 ENCOUNTER — Inpatient Hospital Stay (HOSPITAL_COMMUNITY): Payer: Medicaid Other | Admitting: Occupational Therapy

## 2018-03-01 ENCOUNTER — Inpatient Hospital Stay (HOSPITAL_COMMUNITY): Payer: Medicaid Other | Admitting: Physical Therapy

## 2018-03-01 ENCOUNTER — Encounter (HOSPITAL_COMMUNITY): Payer: Self-pay | Admitting: Cardiology

## 2018-03-01 ENCOUNTER — Inpatient Hospital Stay (HOSPITAL_COMMUNITY): Payer: Medicaid Other

## 2018-03-01 LAB — GLUCOSE, CAPILLARY
GLUCOSE-CAPILLARY: 170 mg/dL — AB (ref 65–99)
Glucose-Capillary: 153 mg/dL — ABNORMAL HIGH (ref 65–99)
Glucose-Capillary: 122 mg/dL — ABNORMAL HIGH (ref 65–99)
Glucose-Capillary: 244 mg/dL — ABNORMAL HIGH (ref 65–99)

## 2018-03-01 MED ORDER — MENTHOL 3 MG MT LOZG
1.0000 | LOZENGE | OROMUCOSAL | Status: DC | PRN
  Filled 2018-03-01: qty 9

## 2018-03-01 MED ORDER — CEPASTAT 14.5 MG MT LOZG
1.0000 | LOZENGE | OROMUCOSAL | Status: DC | PRN
  Filled 2018-03-01: qty 9

## 2018-03-01 NOTE — Progress Notes (Signed)
Physical Therapy Note  Patient Details  Name: [name] MRN: [id number] Date of Birth: 07/23/1951 Today's Date: 03/01/2018    Time: 1030-1055 25 minutes MAKE UP TIME  1:1 No c/o pain.  Pt performs gait in controlled and home environment with mod I without AD.  Pt performs gait up/down ramp and on uneven surface with supervision.  Pt educated on safety awareness for community environments.  Pt requests to attempt tub transfer.  PT demo'd and pt performed tub transfer with pt requiring min A to stand with use of grab bar.  PT educates pt to not attempt tub transfers on her own and to utilize appropriate bathroom equipment recommended by OT, pt verbalizes understanding.  In kitchen pt demos gathering items and setting table, loading dishwasher and retrieving items from refridgerator.  Pt educated on energy conservation and safety techniques.  Pt supervision for all kitchen mobility. Pt left in bed with needs at hand, alarm set.   [name] 03/01/2018, 11:01 AM

## 2018-03-01 NOTE — Progress Notes (Signed)
Physical Therapy Session Note  Patient Details  Name: [name] MRN: [id number] Date of Birth: 02/16/1951  Today's Date: 03/01/2018 PT Individual Time: 1540-0867 PT Individual Time Calculation (min): 45 min   Short Term Goals: Week 1:  PT Short Term Goal 1 (Week 1): STG =LTG due to ELOS   Skilled Therapeutic Interventions/Progress Updates:    Pt supine in bed, agreeable to PT, no complaints of pain. Bed mobility Mod I with use of bedrails. Sit to stand with Supervision. Ambulation x 200 ft with no AD and Supervision with some verbal cueing for direction. Standing balance activities: side-steps with no UE support and SBA, side-steps with orange theraband and no UE support and SBA, side-steps with ball toss with SBA, side-steps with alt L/R cone taps with BUE support and CGA, tandem ambulation with one UE support and CGA. Standing balance on airex with narrow BOS and CGA while in engaging in fine motor LUE task placing clips of varying difficulty level on basketball net. Ascend/descend 12 stairs with 2 handrails and SBA, step-through gait pattern. Pt left supine in bed with needs in reach, bed alarm in place.  Therapy Documentation Precautions:  Precautions Precautions: Fall Precaution Comments: Mild Lt hemi Restrictions Weight Bearing Restrictions: No  See Function Navigator for Current Functional Status.   Therapy/Group: Individual Therapy  Excell Seltzer, PT, DPT  03/01/2018, 12:08 PM

## 2018-03-01 NOTE — Progress Notes (Signed)
Occupational Therapy Session Note  Patient Details  Name: [name] MRN: [id number] Date of Birth: 1951-07-02  Today's Date: 03/01/2018 OT Individual Time: 1100-1130 OT Individual Time Calculation (min): 30 min    Short Term Goals: Week 1:  OT Short Term Goal 1 (Week 1): STGs=LTGs due to ELOS  Skilled Therapeutic Interventions/Progress Updates:    1;1. Pt agreeable to bathing and dressing at sit to stand level in shower seated on TTB. Pt ambulates with S-CGA and VC to not reach out for furniture or doorway. Pt bathes with supervision crossing into seated figure 4 to wash feet. Pt often dropping washcloth with LUE d/t decreased grip strength. Pt dons hospital gown per pt preference, underwear and non slip socks with supervision at sit to stand level. Pt brushes teeth with hips stabilized on sink with supervision. Pt requesting HEP for strengthening hand. OT provides pt with putty HEP and super soft tan putty with hand out for practice outside of tx. Pt requires min VC for technique of exercises and referencing pictures on handout. Exited session with pt seatedin bed call light in reach and all needs met  Therapy Documentation Precautions:  Precautions Precautions: Fall Precaution Comments: Mild Lt hemi Restrictions Weight Bearing Restrictions: No General:  See Function Navigator for Current Functional Status.   Therapy/Group: Individual Therapy  Tonny Branch 03/01/2018, 3:08 PM

## 2018-03-01 NOTE — Progress Notes (Signed)
Speech Language Pathology Weekly Progress and Session Note  Patient Details  Name: [name] MRN: [id number] Date of Birth: 1951-01-15  Beginning of progress report period: February 22, 2018 End of progress report period: March 01, 2018  Today's Date: 03/01/2018 SLP Individual Time: 0930-1030 SLP Individual Time Calculation (min): 60 min  Short Term Goals: Week 1: SLP Short Term Goal 1 (Week 1): Pt will demonstrate sustained attention in functional task for 20 minutes with supervision A verbal cues for redirection. SLP Short Term Goal 1 - Progress (Week 1): Met SLP Short Term Goal 2 (Week 1): Pt will recall novel, daily information with min A verbal cues for use of external aid. SLP Short Term Goal 2 - Progress (Week 1): Met SLP Short Term Goal 3 (Week 1): Pt will demonstrate functional problem solving for basic and familiar (mildly complex) tasks eith min A verbal cues. SLP Short Term Goal 3 - Progress (Week 1): Met SLP Short Term Goal 4 (Week 1): Pt will self-monitor functional and verbal errors in functional tasks with mod A verbal cues. SLP Short Term Goal 4 - Progress (Week 1): Met SLP Short Term Goal 5 (Week 1): Pt will demonstrate safety awareness due to CVA deficits in verbal and functional tasks with min A verbal cues. SLP Short Term Goal 5 - Progress (Week 1): Met    New Short Term Goals: Week 2: SLP Short Term Goal 1 (Week 2): Pt will demonstrate selective attention in a mildly distracting environment for ~30 minutes with Supervision A verbal cues for redirection. SLP Short Term Goal 2 (Week 2): Pt will recall novel and daily information with use of external aids with Mod I.  SLP Short Term Goal 3 (Week 2): Pt will demonstrate functional problem solving for basic and familiar (mildly complex) tasks with Supervision A verbal cues. SLP Short Term Goal 4 (Week 2): Pt will self-monitor and correct errors during functional tasks with Min A verbal cues.  Weekly Progress Updates:  Pt has made functional gains and has met 5 out of 5 STGs this reporting period. Currently, pt requires overall Min A verbal cues for mildly complex problem solving, recall of new information, and emergent awareness. Pt also demonstrates sustained attention to tasks for ~45 minutes with Mod I. Patient and family education ongoing. Patient would benefit from continued skilled SLP intervention to maximize her cognitive function prior to discharge.      Intensity: Minumum of 1-2 x/day, 30 to 90 minutes Frequency: 3 to 5 out of 7 days Duration/Length of Stay: 03/05/2018 Treatment/Interventions: Cognitive remediation/compensation;Cueing hierarchy;Functional tasks;Environmental controls;Internal/external aids;Patient/family education   Daily Session  Skilled Therapeutic Interventions: Skilled treatment session focused on cognitive goals. SLP facilitated session by providing Mod A verbal cues for problem solving and Min A verbal cues for recall during a moderately complex money management task. Pt required Mod A verbal cues to self-monitor and correct errors during task. Pt demonstrated sustained attention for ~45 minutes with Mod I. Pt left upright on EOB with alarm on and all needs within reach. Continue with current plan of care.      Function:   Cognition Comprehension Comprehension assist level: Follows complex conversation/direction with extra time/assistive device  Expression   Expression assist level: Expresses complex ideas: With extra time/assistive device  Social Interaction Social Interaction assist level: Interacts appropriately with others with medication or extra time (anti-anxiety, antidepressant).  Problem Solving Problem solving assist level: Solves basic 75 - 89% of the time/requires cueing 10 - 24% of  the time  Memory Memory assist level: Recognizes or recalls 75 - 89% of the time/requires cueing 10 - 24% of the time   Pain No/Denies pain   Therapy/Group: Individual  Therapy  Meredeth Ide  SLP - Student 03/01/2018, 3:19 PM

## 2018-03-01 NOTE — Progress Notes (Signed)
Occupational Therapy Session Note  Patient Details  Name: [name] MRN: [id number] Date of Birth: 09/27/1951  Today's Date: 03/01/2018 OT Individual Time: 1100-1130 OT Individual Time Calculation (min): 30 min    Skilled Therapeutic Interventions/Progress Updates: patient participated in skilled OT services to work on [employer] pudding from package directions (cold milk/no heat required for these package directions) to both work on balance, endurance and higher level cognitive skills to increase independence upon arrival back to her home.  She was able to follow the 4 step instructions.    However, she mixed up "5 minutes"  for "91minutes" in the title and instrucitons.   In large letters on the recipe it read, "5 minutes prep," and down into the body of the instructions the recipe read, "whisk for 2 minutes."   So she required a prompting to reread the instructions on how long to whisk the ingredients.  Still having recorded that, the instructions were in small print.   This clinician asked patien if maybe she mixed up the time on the instructions due to small print (although she was wearing her reading glasses).   She thought the small print may have confused her eyes.  Patient was assisted back to her room.   Upon request she was assisted back to her bed.   She was reminded not to get out of bed by herself and she showed this clinician which button to press for assistance.   This clinician engaged her bed alarm.     Therapy Documentation Precautions:  Precautions Precautions: Fall Precaution Comments: Mild Lt hemi Restrictions Weight Bearing Restrictions: No Pain: Pain Assessment Pain Scale: 0-10 Pain Score: 0-No pain   See Function Navigator for Current Functional Status.   Therapy/Group: Individual Therapy  Alfredia Ferguson [facility] 03/01/2018, 12:57 PM

## 2018-03-01 NOTE — Progress Notes (Signed)
[place] PHYSICAL MEDICINE & REHABILITATION     PROGRESS NOTE  Subjective/Complaints:  No new issues. Had questions about TEE results. Overall feeling stronger  ROS: Patient denies fever, rash, sore throat, blurred vision, nausea, vomiting, diarrhea, cough, shortness of breath or chest pain, joint or back pain, headache, or mood change. .   Objective: Vital Signs: Blood pressure (!) 147/52, pulse 84, temperature 98.4 F (36.9 C), temperature source Oral, resp. rate 17, SpO2 96 %. No results found. Recent Labs    02/27/18 0534  WBC 4.7  HGB 9.5*  HCT 28.9*  PLT 273   Recent Labs    02/27/18 0534  NA 139  K 3.8  CL 110  GLUCOSE 145*  BUN 23*  CREATININE 1.33*  CALCIUM 9.1   CBG (last 3)  Recent Labs    02/28/18 1641 02/28/18 2115 03/01/18 0630  GLUCAP 200* 81 153*    Wt Readings from Last 3 Encounters:  02/16/18 76 kg (167 lb 8.8 oz)  02/15/18 74.6 kg (164 lb 6.4 oz)  02/06/18 76.2 kg (168 lb 1.6 oz)    Physical Exam:  BP (!) 147/52 (BP Location: Right Arm)   Pulse 84   Temp 98.4 F (36.9 C) (Oral)   Resp 17   SpO2 96%  Constitutional: No distress . Vital signs reviewed. HEENT: EOMI, oral membranes moist Cardiovascular: RRR without murmur. No JVD    Respiratory: CTA Bilaterally without wheezes or rales. Normal effort    GI: BS +, non-tender, non-distended  Musculoskeletal: She exhibits no edema or tenderness.  Neurological: Much more alert and initiating. Speech clear Motor: LUE  4-/5 LLE: 3+ to 4-/5 proximal to distal   RUE/RLE: 4-5/5 proximal to distal-  Skin: Skin is warm and dry. She is not diaphoretic.  Psychiatric: Pleasant and cooperative  Assessment/Plan: 1. 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 Physiatrist is providing close team supervision and 24 hour  management of active medical problems listed below. Physiatrist and rehab team continue to assess barriers to discharge/monitor patient progress toward functional and medical goals.  Function:  Bathing Bathing position   Position: Shower  Bathing parts Body parts bathed by patient: Right arm, Left arm, Chest, Abdomen, Front perineal area, Buttocks, Right upper leg, Left upper leg, Right lower leg, Left lower leg Body parts bathed by helper: Back  Bathing assist Assist Level: Supervision or verbal cues      Upper Body Dressing/Undressing Upper body dressing   What is the patient wearing?: Pull over shirt/dress     Pull over shirt/dress - Perfomed by patient: Thread/unthread right sleeve, Thread/unthread left sleeve, Put head through opening, Pull shirt over trunk          Upper body assist Assist Level: Supervision or verbal cues      Lower Body Dressing/Undressing Lower body dressing   What is the patient wearing?: Non-skid slipper socks, Shoes, Underwear Underwear - Performed by patient: Thread/unthread right underwear leg, Thread/unthread left underwear leg, Pull underwear up/down   Pants- Performed by patient: Thread/unthread right pants leg, Thread/unthread left pants leg, Pull pants up/down   Non-skid slipper socks- Performed by patient: Don/doff right sock, Don/doff left sock       Shoes - Performed by patient: Don/doff right shoe, Don/doff left shoe, Fasten right, Fasten left            Lower body  assist Assist for lower body dressing: Supervision or verbal cues      Toileting Toileting Toileting activity did not occur: No continent bowel/bladder event Toileting steps completed by patient: Performs perineal hygiene Toileting steps completed by helper: Adjust clothing after toileting Toileting Assistive Devices: Grab bar or rail  Toileting assist Assist level: No help/no cues   Transfers Chair/bed transfer Chair/bed transfer activity did not occur:  N/A Chair/bed transfer method: Ambulatory Chair/bed transfer assist level: Touching or steadying assistance (Pt > 75%) Chair/bed transfer assistive device: Medical sales representative     Max distance: >150 ft Assist level: Touching or steadying assistance (Pt > 75%)   Wheelchair   Type: Manual Max wheelchair distance: 126ft  Assist Level: No help, No cues, assistive device, takes more than reasonable amount of time  Cognition Comprehension Comprehension assist level: Follows complex conversation/direction with no assist  Expression Expression assist level: Expresses complex ideas: With no assist  Social Interaction Social Interaction assist level: Interacts appropriately with others - No medications needed.  Problem Solving Problem solving assist level: Solves complex problems: Recognizes & self-corrects  Memory Memory assist level: Complete Independence: No helper    Medical Problem List and Plan: 1. Unsteady gait, poor safety with impulsivity secondary to acute/early subacute infarct in right mid corona radiata with few adjacent punctate foci.   -increased cluster of infarcts in right CR/frontal WM on MRI last weekend   -TEE yesterday without significant findings   -Plavix and aspirin for stroke prophylaxis 2.  DVT Prophylaxis/Anticoagulation: Pharmaceutical: Lovenox 3. Pain Management: tylenol prn  4. Mood: LCSW to follow for evaluation and support.  5. Neuropsych: This patient is not fully capable of making decisions on her own behalf. 6. Skin/Wound Care: routine pressure relief. 7. Fluids/Electrolytes/Nutrition: .    -bun/cr improved to 23/1.33 4/17   -encouraging PO.    -recheck labs monday 8. T2DM:     -improved control overall  -maintain 70/30 insulin at 32u bid with SSI covg 9. Muscle spasms: Baclofen held due to lethargy.  No active spasms at present 10 HTN:   (HCTZ and Cozaar daily.)   BP more robust     -continue to hold medications due to concerns over  hypoperfusion.   -reintroduce meds as bp dictates 11. Hypothyroid: On supplement  12. Headaches: improved 13. Acute blood loss anemia  Hemoglobin 9.5 on 4/17--recheck monday  Cont to monitor 14. CKD   Creatinine 1.33 on 4/17  Encourage fluids  Continue to monitor   LOS (Days) 7 A FACE TO FACE EVALUATION WAS PERFORMED  Meredith Staggers 03/01/2018 10:32 AM

## 2018-03-01 NOTE — Plan of Care (Signed)
  Problem: RH BOWEL ELIMINATION Goal: RH STG MANAGE BOWEL WITH ASSISTANCE Description STG Manage Bowel with min Assistance.  Outcome: Progressing Goal: RH STG MANAGE BOWEL W/MEDICATION W/ASSISTANCE Description STG Manage Bowel with Medication with min Assistance.  Outcome: Progressing   Problem: RH BLADDER ELIMINATION Goal: RH STG MANAGE BLADDER WITH ASSISTANCE Description STG Manage Bladder With min Assistance  Outcome: Progressing Goal: RH STG MANAGE BLADDER WITH EQUIPMENT WITH ASSISTANCE Description STG Manage Bladder With Equipment With min Assistance  Outcome: Progressing   Problem: RH SKIN INTEGRITY Goal: RH STG SKIN FREE OF INFECTION/BREAKDOWN Outcome: Progressing   Problem: RH SAFETY Goal: RH STG ADHERE TO SAFETY PRECAUTIONS W/ASSISTANCE/DEVICE Description STG Adhere to Safety Precautions With min Assistance/Device.  Outcome: Progressing Goal: RH STG DECREASED RISK OF FALL WITH ASSISTANCE Description STG Decreased Risk of Fall With min Assistance.  Outcome: Progressing   Problem: RH PAIN MANAGEMENT Goal: RH STG PAIN MANAGED AT OR BELOW PT'S PAIN GOAL Description Less than 3 out of 10  Outcome: Progressing

## 2018-03-02 ENCOUNTER — Inpatient Hospital Stay (HOSPITAL_COMMUNITY): Payer: Medicare HMO | Admitting: Occupational Therapy

## 2018-03-02 DIAGNOSIS — I69354 Hemiplegia and hemiparesis following cerebral infarction affecting left non-dominant side: Secondary | ICD-10-CM

## 2018-03-02 DIAGNOSIS — N183 Chronic kidney disease, stage 3 (moderate): Secondary | ICD-10-CM

## 2018-03-02 LAB — GLUCOSE, CAPILLARY
GLUCOSE-CAPILLARY: 151 mg/dL — AB (ref 65–99)
GLUCOSE-CAPILLARY: 156 mg/dL — AB (ref 65–99)
Glucose-Capillary: 173 mg/dL — ABNORMAL HIGH (ref 65–99)
Glucose-Capillary: 155 mg/dL — ABNORMAL HIGH (ref 65–99)

## 2018-03-02 NOTE — Plan of Care (Signed)
  Problem: RH BOWEL ELIMINATION Goal: RH STG MANAGE BOWEL WITH ASSISTANCE Description STG Manage Bowel with min Assistance.  Outcome: Progressing Goal: RH STG MANAGE BOWEL W/MEDICATION W/ASSISTANCE Description STG Manage Bowel with Medication with min Assistance.  Outcome: Progressing   Problem: RH BLADDER ELIMINATION Goal: RH STG MANAGE BLADDER WITH ASSISTANCE Description STG Manage Bladder With min Assistance  Outcome: Progressing Goal: RH STG MANAGE BLADDER WITH EQUIPMENT WITH ASSISTANCE Description STG Manage Bladder With Equipment With min Assistance  Outcome: Progressing   Problem: RH SKIN INTEGRITY Goal: RH STG SKIN FREE OF INFECTION/BREAKDOWN Outcome: Progressing   Problem: RH SAFETY Goal: RH STG ADHERE TO SAFETY PRECAUTIONS W/ASSISTANCE/DEVICE Description STG Adhere to Safety Precautions With min Assistance/Device.  Outcome: Progressing Goal: RH STG DECREASED RISK OF FALL WITH ASSISTANCE Description STG Decreased Risk of Fall With min Assistance.  Outcome: Progressing   Problem: RH PAIN MANAGEMENT Goal: RH STG PAIN MANAGED AT OR BELOW PT'S PAIN GOAL Description Less than 3 out of 10  Outcome: Progressing   Problem: Consults Goal: RH STROKE PATIENT EDUCATION Description See Patient Education module for education specifics  Outcome: Progressing

## 2018-03-02 NOTE — Progress Notes (Signed)
Occupational Therapy Session Note  Patient Details  Name: [name] MRN: [id number] Date of Birth: 09-26-1951  Today's Date: 03/02/2018 OT Individual Time: 1000-1100 OT Individual Time Calculation (min): 60 min    Short Term Goals: Week 1:  OT Short Term Goal 1 (Week 1): STGs=LTGs due to ELOS  Skilled Therapeutic Interventions/Progress Updates:    Upon entering the room, pt in bathroom toileting with overall supervision for clothing management. Pt standing at sink for hand hygiene and to wash hands at mod I level. Pt fastening snaps with B hands to don hospital gown with increased time while seated. Pt ambulating with supervision to day room. Pt engaged in L hand fine motor coordination and strengthening task with use of yellow resistive theraputty. Paper handout provided with exercises and pt needing min verbal cues for proper technique. Pt returning to room at end of session in same manner and seated in recliner chair. Call bell and all needed items within reach upon exiting the room.   Therapy Documentation Precautions:  Precautions Precautions: Fall Precaution Comments: Mild Lt hemi Restrictions Weight Bearing Restrictions: No ADL: ADL ADL Comments: Please see functional navigator for ADL status  See Function Navigator for Current Functional Status.   Therapy/Group: Individual Therapy  [name] 03/02/2018, 12:36 PM

## 2018-03-02 NOTE — Progress Notes (Signed)
[place] PHYSICAL MEDICINE & REHABILITATION     PROGRESS NOTE  Subjective/Complaints:  No issues overnight, discussed diabetic management.  She is hoping to be off injections.  We discussed that her diabetes cannot be controlled with oral medications alone  ROS: Patient denies fever, rash, sore throat, blurred vision, nausea, vomiting, diarrhea, cough, shortness of breath or chest pain, joint or back pain, headache, or mood change. .   Objective: Vital Signs: Blood pressure 138/65, pulse 83, temperature 98.4 F (36.9 C), temperature source Oral, resp. rate 18, SpO2 98 %. No results found. No results for input(s): WBC, HGB, HCT, PLT in the last 72 hours. No results for input(s): NA, K, CL, GLUCOSE, BUN, CREATININE, CALCIUM in the last 72 hours.  Invalid input(s): CO CBG (last 3)  Recent Labs    03/01/18 2129 03/02/18 0633 03/02/18 1134  GLUCAP 170* 173* 151*    Wt Readings from Last 3 Encounters:  02/16/18 76 kg (167 lb 8.8 oz)  02/15/18 74.6 kg (164 lb 6.4 oz)  02/06/18 76.2 kg (168 lb 1.6 oz)    Physical Exam:  BP 138/65 (BP Location: Left Arm)   Pulse 83   Temp 98.4 F (36.9 C) (Oral)   Resp 18   SpO2 98%  Constitutional: No distress . Vital signs reviewed. HEENT: EOMI, oral membranes moist Cardiovascular: RRR without murmur. No JVD    Respiratory: CTA Bilaterally without wheezes or rales. Normal effort    GI: BS +, non-tender, non-distended  Musculoskeletal: She exhibits no edema or tenderness.  Neurological: Much more alert and initiating. Speech clear Motor: LUE  4-/5 LLE: 3+ to 4-/5 proximal to distal   RUE/RLE: 4-5/5 proximal to distal-  Skin: Skin is warm and dry. She is not diaphoretic.  Psychiatric: Pleasant and cooperative  Assessment/Plan: 1. 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 Physiatrist is providing close team supervision and 24 hour management of active medical problems listed below. Physiatrist and rehab team continue to assess barriers to discharge/monitor patient progress toward functional and medical goals.  Function:  Bathing Bathing position   Position: Shower  Bathing parts Body parts bathed by patient: Right arm, Left arm, Chest, Abdomen, Front perineal area, Buttocks, Right upper leg, Left upper leg, Right lower leg, Left lower leg Body parts bathed by helper: Back  Bathing assist Assist Level: Supervision or verbal cues      Upper Body Dressing/Undressing Upper body dressing   What is the patient wearing?: Pull over shirt/dress     Pull over shirt/dress - Perfomed by patient: Thread/unthread right sleeve, Thread/unthread left sleeve, Put head through opening, Pull shirt over trunk          Upper body assist Assist Level: Supervision or verbal cues      Lower Body Dressing/Undressing Lower body dressing   What is the patient wearing?: Non-skid slipper socks, Shoes, Underwear Underwear - Performed by patient: Thread/unthread right underwear leg, Thread/unthread left underwear leg, Pull underwear up/down   Pants- Performed by patient: Thread/unthread right pants leg, Thread/unthread left pants leg, Pull pants up/down   Non-skid slipper socks- Performed by patient: Don/doff right sock, Don/doff left sock       Shoes - Performed by patient: Don/doff right shoe, Don/doff left shoe, Fasten right, Fasten left            Lower body assist Assist for lower  body dressing: Supervision or verbal cues      Toileting Toileting Toileting activity did not occur: No continent bowel/bladder event Toileting steps completed by patient: Adjust clothing prior to toileting, Adjust clothing after toileting Toileting steps completed by helper: Adjust clothing after toileting Toileting Assistive Devices: Grab bar or rail  Toileting assist  Assist level: No help/no cues   Transfers Chair/bed transfer Chair/bed transfer activity did not occur: N/A Chair/bed transfer method: Ambulatory Chair/bed transfer assist level: Supervision or verbal cues Chair/bed transfer assistive device: Medical sales representative     Max distance: >150 ft Assist level: No Help, No cues   Wheelchair Wheelchair activity did not occur: N/A Type: Manual Max wheelchair distance: 167ft  Assist Level: No help, No cues, assistive device, takes more than reasonable amount of time  Cognition Comprehension Comprehension assist level: Follows complex conversation/direction with extra time/assistive device  Expression Expression assist level: Expresses complex ideas: With no assist  Social Interaction Social Interaction assist level: Interacts appropriately with others - No medications needed.  Problem Solving Problem solving assist level: Solves complex problems: Recognizes & self-corrects  Memory Memory assist level: Complete Independence: No helper    Medical Problem List and Plan: 1. Unsteady gait, poor safety with impulsivity secondary to acute/early subacute infarct in right mid corona radiata with few adjacent punctate foci.   -increased cluster of infarcts in right CR/frontal WM on MRI last weekend   -TEE yesterday without significant findings   -Plavix and aspirin for stroke prophylaxis 2.  DVT Prophylaxis/Anticoagulation: Pharmaceutical: Lovenox 3. Pain Management: tylenol prn  4. Mood: LCSW to follow for evaluation and support.  5. Neuropsych: This patient is not fully capable of making decisions on her own behalf. 6. Skin/Wound Care: routine pressure relief. 7. Fluids/Electrolytes/Nutrition: .    -bun/cr improved to 23/1.33 4/17   -encouraging PO.    -recheck labs monday 8. T2DM:     -Blood sugars in acceptable range, discussed diabetic management with patient  -maintain 70/30 insulin at 32u bid with SSI covg 9. Muscle spasms:  Baclofen held due to lethargy.  No active spasms at present 10 HTN:   (Was on HCTZ and Cozaar daily.)    Vitals:   03/01/18 1343 03/02/18 0106  BP: (!) 133/46 138/65  Pulse: 86 83  Resp: 19 18  Temp: 98.7 F (37.1 C) 98.4 F (36.9 C)  SpO2: 99% 98%  Blood pressure in a good range, on Cozaar not HCTZ 11. Hypothyroid: On supplement  12. Headaches: improved 13. Acute blood loss anemia  Hemoglobin 9.5 on 4/17--recheck monday  Cont to monitor 14. CKD   Creatinine 1.33 on 4/17  Encourage fluids  Continue to monitor   LOS (Days) 8 A FACE TO FACE EVALUATION WAS PERFORMED  Charlett Blake 03/02/2018 12:12 PM

## 2018-03-03 ENCOUNTER — Inpatient Hospital Stay (HOSPITAL_COMMUNITY): Payer: Medicare HMO

## 2018-03-03 LAB — GLUCOSE, CAPILLARY
GLUCOSE-CAPILLARY: 168 mg/dL — AB (ref 65–99)
GLUCOSE-CAPILLARY: 165 mg/dL — AB (ref 65–99)
Glucose-Capillary: 170 mg/dL — ABNORMAL HIGH (ref 65–99)
Glucose-Capillary: 141 mg/dL — ABNORMAL HIGH (ref 65–99)

## 2018-03-03 NOTE — Progress Notes (Signed)
[place] PHYSICAL MEDICINE & REHABILITATION     PROGRESS NOTE  Subjective/Complaints:  Patient without new complaints today.  ROS: Patient denies fever, rash, sore throat, blurred vision, nausea, vomiting, diarrhea, cough, shortness of breath or chest pain, joint or back pain, headache, or mood change. .   Objective: Vital Signs: Blood pressure (!) 143/59, pulse 81, temperature 98.6 F (37 C), temperature source Oral, resp. rate 18, SpO2 98 %. No results found. No results for input(s): WBC, HGB, HCT, PLT in the last 72 hours. No results for input(s): NA, K, CL, GLUCOSE, BUN, CREATININE, CALCIUM in the last 72 hours.  Invalid input(s): CO CBG (last 3)  Recent Labs    03/02/18 1700 03/02/18 2124 03/03/18 0707  GLUCAP 155* 156* 170*    Wt Readings from Last 3 Encounters:  02/16/18 76 kg (167 lb 8.8 oz)  02/15/18 74.6 kg (164 lb 6.4 oz)  02/06/18 76.2 kg (168 lb 1.6 oz)    Physical Exam:  BP (!) 143/59   Pulse 81   Temp 98.6 F (37 C) (Oral)   Resp 18   SpO2 98%  Constitutional: No distress . Vital signs reviewed. HEENT: EOMI, oral membranes moist Cardiovascular: RRR without murmur. No JVD    Respiratory: CTA Bilaterally without wheezes or rales. Normal effort    GI: BS +, non-tender, non-distended  Musculoskeletal: She exhibits no edema or tenderness.  Neurological: Much more alert and initiating. Speech clear Motor: LUE  4-/5 LLE: 3+ to 4-/5 proximal to distal   RUE/RLE: 4-5/5 proximal to distal-  Skin: Skin is warm and dry. She is not diaphoretic.  Psychiatric: Pleasant and cooperative  Assessment/Plan: 1. 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 Physiatrist is providing close team supervision and 24 hour management of active medical problems listed below. Physiatrist and rehab team  continue to assess barriers to discharge/monitor patient progress toward functional and medical goals.  Function:  Bathing Bathing position   Position: Shower  Bathing parts Body parts bathed by patient: Right arm, Left arm, Chest, Abdomen, Front perineal area, Buttocks, Right upper leg, Left upper leg, Right lower leg, Left lower leg Body parts bathed by helper: Back  Bathing assist Assist Level: Supervision or verbal cues      Upper Body Dressing/Undressing Upper body dressing   What is the patient wearing?: Pull over shirt/dress     Pull over shirt/dress - Perfomed by patient: Thread/unthread right sleeve, Thread/unthread left sleeve, Put head through opening, Pull shirt over trunk          Upper body assist Assist Level: Supervision or verbal cues      Lower Body Dressing/Undressing Lower body dressing   What is the patient wearing?: Non-skid slipper socks, Shoes, Underwear Underwear - Performed by patient: Thread/unthread right underwear leg, Thread/unthread left underwear leg, Pull underwear up/down   Pants- Performed by patient: Thread/unthread right pants leg, Thread/unthread left pants leg, Pull pants up/down   Non-skid slipper socks- Performed by patient: Don/doff right sock, Don/doff left sock       Shoes - Performed by patient: Don/doff right shoe, Don/doff left shoe, Fasten right, Fasten left            Lower body assist Assist for lower body dressing: Supervision or verbal cues      Toileting Toileting Toileting activity did not occur: No continent bowel/bladder event Toileting steps  completed by patient: Adjust clothing prior to toileting, Performs perineal hygiene, Adjust clothing after toileting Toileting steps completed by helper: Adjust clothing after toileting Toileting Assistive Devices: Grab bar or rail  Toileting assist Assist level: Touching or steadying assistance (Pt.75%)   Transfers Chair/bed transfer Chair/bed transfer activity did not  occur: N/A Chair/bed transfer method: Ambulatory Chair/bed transfer assist level: Supervision or verbal cues Chair/bed transfer assistive device: Medical sales representative     Max distance: >150 ft Assist level: No Help, No cues   Wheelchair Wheelchair activity did not occur: N/A Type: Manual Max wheelchair distance: 197ft  Assist Level: No help, No cues, assistive device, takes more than reasonable amount of time  Cognition Comprehension Comprehension assist level: Follows complex conversation/direction with extra time/assistive device  Expression Expression assist level: Expresses complex ideas: With no assist  Social Interaction Social Interaction assist level: Interacts appropriately with others - No medications needed.  Problem Solving Problem solving assist level: Solves complex problems: Recognizes & self-corrects  Memory Memory assist level: Complete Independence: No helper    Medical Problem List and Plan: 1. Unsteady gait, poor safety with impulsivity secondary to acute/early subacute infarct in right mid corona radiata with few adjacent punctate foci.   -increased cluster of infarcts in right CR/frontal WM on MRI last weekend, continue CIR PT OT speech    -TEEwithout significant findings   -Plavix and aspirin for stroke prophylaxis 2.  DVT Prophylaxis/Anticoagulation: Pharmaceutical: Lovenox 3. Pain Management: tylenol prn  4. Mood: LCSW to follow for evaluation and support.  5. Neuropsych: This patient is not fully capable of making decisions on her own behalf. 6. Skin/Wound Care: routine pressure relief. 7. Fluids/Electrolytes/Nutrition: .    -bun/cr improved to 23/1.33 4/17   -encouraging PO.    -recheck labs monday 8. T2DM:   CBG (last 3)  Recent Labs    03/02/18 1700 03/02/18 2124 03/03/18 0707  GLUCAP 155* 156* 170*     -Blood sugars in acceptable range, 03/03/2018 -maintain 70/30 insulin at 32u bid with SSI covg 9. Muscle spasms: Baclofen held  due to lethargy.  No active spasms at present 10 HTN:   (Was on HCTZ and Cozaar daily.)    Vitals:   03/02/18 1502 03/03/18 0320  BP: (!) 142/54 (!) 143/59  Pulse: 83 81  Resp: 18   Temp: 98.6 F (37 C) 98.6 F (37 C)  SpO2: 97% 98%  Blood pressure in a good range, on Cozaar, 03/03/2018 not HCTZ 11. Hypothyroid: On supplement  12. Headaches: improved 13. Acute blood loss anemia  Hemoglobin 9.5 on 4/17--recheck monday  Cont to monitor 14. CKD   Creatinine 1.33 on 4/17  Encourage fluids  Continue to monitor   LOS (Days) 9 A FACE TO FACE EVALUATION WAS PERFORMED  Charlett Blake 03/03/2018 9:46 AM

## 2018-03-03 NOTE — Progress Notes (Signed)
Physical Therapy Session Note  Patient Details  Name: [name] MRN: [id number] Date of Birth: 21-Sep-1951  Today's Date: 03/03/2018 PT Individual Time: 1500-1530 PT Individual Time Calculation (min): 30 min   Short Term Goals: Week 1:  PT Short Term Goal 1 (Week 1): STG =LTG due to ELOS   Skilled Therapeutic Interventions/Progress Updates:    Pt request to work on fine motor tasks in L hand this session. Pt donned shoes including tying laces modified independent with extra time to prepare for therapy session. Functional gait on unit at supervision approaching modified independent level demonstrating good step length and no overt LOB, slower gait speed noted. Fine motor task to address neuro re-ed to L hand for stringing of small beads and manipulation of small beads with L hand focusing on pincer grip. D/c planning discussed and activities pt can do in the home as well for continued progress. Pt motivated to improve overall.    Weekly note update: Pt making great progress and on track to meet LTG's at modified independent level. Planning for grad day tomorrow with discharge from PT services end of day. Continue plan of care. Full weekly note to be completed tomorrow if plan for d/c changes and pt does not d/c.   Therapy Documentation Precautions:  Precautions Precautions: Fall Precaution Comments: Mild Lt hemi Restrictions Weight Bearing Restrictions: No Pain:  Denies pain.    See Function Navigator for Current Functional Status.   Therapy/Group: Individual Therapy  [name], PT, DPT  03/03/2018, 3:51 PM

## 2018-03-03 NOTE — Progress Notes (Signed)
Occupational Therapy Session Note  Patient Details  Name: [name] MRN: [id number] Date of Birth: 06/02/1951  Today's Date: 03/03/2018 OT Individual Time: 1550-1650 OT Individual Time Calculation (min): 60 min    Short Term Goals: Week 1:  OT Short Term Goal 1 (Week 1): STGs=LTGs due to ELOS  Skilled Therapeutic Interventions/Progress Updates:    1:1. No c/o pain. Pt agreeable to complete performance analysis tasks for assessing motor and process skills of making scrambled eggs/toast/brewed coffee and sweeping the kitchen floor as these are typical activities pt would perform at home. Throughout performance pt demonstrates the following diminished motor/process skills: grips, manipulates, positions, stabilizes, aligns, navigates, gathers, continues, inquires, restores, notices, and coordinates. Pt and OT discussed impact of diminished skills and implications for safety risks and adaptive strategies to use in kitchen to compensate. Exited session with pt seated EOB, call light in reach and all needs met.   Therapy Documentation Precautions:  Precautions Precautions: Fall Precaution Comments: Mild Lt hemi Restrictions Weight Bearing Restrictions: No General:   t Functional Status.   Therapy/Group: Individual Therapy   M  03/03/2018, 5:13 PM 

## 2018-03-04 ENCOUNTER — Inpatient Hospital Stay (HOSPITAL_COMMUNITY): Payer: Medicare HMO | Admitting: Speech Pathology

## 2018-03-04 ENCOUNTER — Inpatient Hospital Stay (HOSPITAL_COMMUNITY): Payer: Medicare HMO | Admitting: Occupational Therapy

## 2018-03-04 ENCOUNTER — Inpatient Hospital Stay (HOSPITAL_COMMUNITY): Payer: Medicare HMO

## 2018-03-04 ENCOUNTER — Telehealth: Payer: Self-pay | Admitting: Internal Medicine

## 2018-03-04 DIAGNOSIS — I639 Cerebral infarction, unspecified: Secondary | ICD-10-CM | POA: Diagnosis not present

## 2018-03-04 LAB — BASIC METABOLIC PANEL
Anion gap: 12 (ref 5–15)
BUN: 28 mg/dL — ABNORMAL HIGH (ref 6–20)
CHLORIDE: 106 mmol/L (ref 101–111)
CO2: 18 mmol/L — AB (ref 22–32)
CREATININE: 1.37 mg/dL — AB (ref 0.44–1.00)
Calcium: 9.1 mg/dL (ref 8.9–10.3)
GFR calc non Af Amer: 39 mL/min — ABNORMAL LOW (ref >60)
GFR, EST AFRICAN AMERICAN: 45 mL/min — AB (ref >60)
GLUCOSE: 168 mg/dL — AB (ref 65–99)
Potassium: 3.8 mmol/L (ref 3.5–5.1)
Sodium: 136 mmol/L (ref 135–145)

## 2018-03-04 LAB — GLUCOSE, CAPILLARY
GLUCOSE-CAPILLARY: 178 mg/dL — AB (ref 65–99)
GLUCOSE-CAPILLARY: 136 mg/dL — AB (ref 65–99)
Glucose-Capillary: 161 mg/dL — ABNORMAL HIGH (ref 65–99)
Glucose-Capillary: 235 mg/dL — ABNORMAL HIGH (ref 65–99)

## 2018-03-04 LAB — CBC
HCT: 29 % — ABNORMAL LOW (ref 36.0–46.0)
Hemoglobin: 9.7 g/dL — ABNORMAL LOW (ref 12.0–15.0)
MCH: 29.6 pg (ref 26.0–34.0)
MCHC: 33.4 g/dL (ref 30.0–36.0)
MCV: 88.4 fL (ref 78.0–100.0)
Platelets: 326 10*3/uL (ref 150–400)
RBC: 3.28 MIL/uL — AB (ref 3.87–5.11)
RDW: 15.2 % (ref 11.5–15.5)
WBC: 5.4 10*3/uL (ref 4.0–10.5)

## 2018-03-04 NOTE — Progress Notes (Signed)
[place] PHYSICAL MEDICINE & REHABILITATION     PROGRESS NOTE  Subjective/Complaints:  Up at eob. Feels well. Anxious to go home.   ROS: Patient denies fever, rash, sore throat, blurred vision, nausea, vomiting, diarrhea, cough, shortness of breath or chest pain, joint or back pain, headache, or mood change.   Objective: Vital Signs: Blood pressure (!) 155/69, pulse 80, temperature 97.9 F (36.6 C), temperature source Oral, resp. rate 17, SpO2 96 %. No results found. Recent Labs    03/04/18 0629  WBC 5.4  HGB 9.7*  HCT 29.0*  PLT 326   Recent Labs    03/04/18 0629  NA 136  K 3.8  CL 106  GLUCOSE 168*  BUN 28*  CREATININE 1.37*  CALCIUM 9.1   CBG (last 3)  Recent Labs    03/03/18 1649 03/03/18 2207 03/04/18 0636  GLUCAP 165* 141* 161*    Wt Readings from Last 3 Encounters:  02/16/18 76 kg (167 lb 8.8 oz)  02/15/18 74.6 kg (164 lb 6.4 oz)  02/06/18 76.2 kg (168 lb 1.6 oz)    Physical Exam:  BP (!) 155/69 (BP Location: Right Arm)   Pulse 80   Temp 97.9 F (36.6 C) (Oral)   Resp 17   SpO2 96%  Constitutional: No distress . Vital signs reviewed. HEENT: EOMI, oral membranes moist Cardiovascular: RRR without murmur. No JVD    Respiratory: CTA Bilaterally without wheezes or rales. Normal effort    GI: BS +, non-tender, non-distended  Musculoskeletal: She exhibits no edema or tenderness.  Neurological: alert and oriented Speech clear Motor: LUE  4-/5 LLE: 3+ to 4-/5 proximal to distal   RUE/RLE: 4-5/5 proximal to distal- good sitting balance Skin: Skin is warm and dry. She is not diaphoretic.  Psychiatric: Pleasant and cooperative  Assessment/Plan: 1. 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 Physiatrist is providing close team supervision and 24 hour management of active medical  problems listed below. Physiatrist and rehab team continue to assess barriers to discharge/monitor patient progress toward functional and medical goals.  Function:  Bathing Bathing position   Position: Shower  Bathing parts Body parts bathed by patient: Right arm, Left arm, Chest, Abdomen, Front perineal area, Buttocks, Right upper leg, Left upper leg, Right lower leg, Left lower leg Body parts bathed by helper: Back  Bathing assist Assist Level: Supervision or verbal cues      Upper Body Dressing/Undressing Upper body dressing   What is the patient wearing?: Pull over shirt/dress     Pull over shirt/dress - Perfomed by patient: Thread/unthread right sleeve, Thread/unthread left sleeve, Put head through opening, Pull shirt over trunk          Upper body assist Assist Level: Supervision or verbal cues      Lower Body Dressing/Undressing Lower body dressing   What is the patient wearing?: Non-skid slipper socks, Shoes, Underwear Underwear - Performed by patient: Thread/unthread right underwear leg, Thread/unthread left underwear leg, Pull underwear up/down   Pants- Performed by patient: Thread/unthread right pants leg, Thread/unthread left pants leg, Pull pants up/down   Non-skid slipper socks- Performed by patient: Don/doff right sock, Don/doff left sock       Shoes - Performed by patient: Don/doff right shoe, Don/doff left shoe, Fasten right, Fasten left            Lower body assist Assist  for lower body dressing: Supervision or verbal cues      Toileting Toileting Toileting activity did not occur: No continent bowel/bladder event Toileting steps completed by patient: Adjust clothing prior to toileting, Performs perineal hygiene, Adjust clothing after toileting Toileting steps completed by helper: Adjust clothing after toileting Toileting Assistive Devices: Grab bar or rail  Toileting assist Assist level: Touching or steadying assistance (Pt.75%)    Transfers Chair/bed transfer Chair/bed transfer activity did not occur: N/A Chair/bed transfer method: Ambulatory Chair/bed transfer assist level: No Help, no cues, assistive device, takes more than a reasonable amount of time Chair/bed transfer assistive device: Medical sales representative     Max distance: 200' Assist level: No Help, No cues   Wheelchair Wheelchair activity did not occur: N/A Type: Manual Max wheelchair distance: 145ft  Assist Level: No help, No cues, assistive device, takes more than reasonable amount of time  Cognition Comprehension Comprehension assist level: Follows complex conversation/direction with extra time/assistive device  Expression Expression assist level: Expresses complex ideas: With no assist  Social Interaction Social Interaction assist level: Interacts appropriately with others - No medications needed.  Problem Solving Problem solving assist level: Solves complex problems: Recognizes & self-corrects  Memory Memory assist level: Complete Independence: No helper    Medical Problem List and Plan: 1. Unsteady gait, poor safety with impulsivity secondary to acute/early subacute infarct in right mid corona radiata with few adjacent punctate foci.   -increased cluster of infarcts in right CR/frontal WM on MRI last weekend, continue CIR PT OT speech    -TEEwithout significant findings   -Plavix and aspirin for stroke prophylaxis   -discussed with patient re: inability to drive initially and that she will need some help once home. 2.  DVT Prophylaxis/Anticoagulation: Pharmaceutical: Lovenox 3. Pain Management: tylenol prn  4. Mood: LCSW to follow for evaluation and support.  5. Neuropsych: This patient is not fully capable of making decisions on her own behalf. 6. Skin/Wound Care: routine pressure relief. 7. Fluids/Electrolytes/Nutrition: .    -bun/cr improved to 28/1.37 on 4/22   -encouraging PO.    -I personally reviewed the patient's labs  today.   8. T2DM:   CBG (last 3)  Recent Labs    03/03/18 1649 03/03/18 2207 03/04/18 0636  GLUCAP 165* 141* 161*     -Blood sugars in acceptable range, 4/22   -maintain 70/30 insulin at 32u bid with SSI covg 9. Muscle spasms: Baclofen held due to lethargy.  No active spasms at present 10 HTN:   (Was on HCTZ and Cozaar daily.)    Vitals:   03/03/18 1340 03/04/18 0246  BP: (!) 135/54 (!) 155/69  Pulse: 79 80  Resp: 18 17  Temp: 98.7 F (37.1 C) 97.9 F (36.6 C)  SpO2: 95% 96%  Blood pressure in a good range, on Cozaar, 03/04/2018 still holding HCTZ  11. Hypothyroid: On supplement  12. Headaches: improved 13. Acute blood loss anemia  Hemoglobin 9.7 4/22  Cont to monitor 14. CKD   Creatinine 1.37 4/22  Encourage fluids  Continue to monitor   LOS (Days) 10 A FACE TO FACE EVALUATION WAS PERFORMED  Meredith Staggers 03/04/2018 9:44 AM

## 2018-03-04 NOTE — Plan of Care (Signed)
  Problem: RH SAFETY Goal: RH STG ADHERE TO SAFETY PRECAUTIONS W/ASSISTANCE/DEVICE Description STG Adhere to Safety Precautions With min Assistance/Device.  Outcome: Progressing   Problem: RH PAIN MANAGEMENT Goal: RH STG PAIN MANAGED AT OR BELOW PT'S PAIN GOAL Description Less than 3 out of 10  Outcome: Progressing   Problem: Consults Goal: RH STROKE PATIENT EDUCATION Description See Patient Education module for education specifics  Outcome: Progressing

## 2018-03-04 NOTE — Progress Notes (Signed)
Speech Language Pathology Session Note and Discharge Summary  Patient Details  Name: [name] MRN: [id number] Date of Birth: 01-05-1951  Today's Date: 03/04/2018 SLP Individual Time: 0830-0930 SLP Individual Time Calculation (min): 60 min   Skilled Therapeutic Interventions: Skilled treatment session focused on cognitive goals. SLP facilitated session by re-administering MOCA version 7.3. Pt scored 23 out of 30 points with 26 points and above considered normal. Although pt continues to demonstrate deficits in recall and problem solving, pt's score increased 7 points compared to last administration on 02/23/2018. Pt educated on progress and memory strategies that can be used at home. Pt verbalized understanding and agreement. Pt left EOB with alarm on and all needs within reach. Continue with current plan of care.    Patient has met 4 of 4 long term goals.  Patient to discharge at overall Supervision level.  Reasons goals not met: N/A   Clinical Impression/Discharge Summary: Pt has made functional gains and has met 4 out of 4 LTGs this admission due to improved cognitive functioning. Currently, pt requires overall Supervision verbal cues for mildly complex problem solving, recall, and selective attention in a mildly distracting environment for ~30 minutes. Pt also demonstrates emergent awareness with Supervision verbal cues. Pt education complete and pt will discharge home with intermittent supervision from family. Pt would benefit from home health SLP services to maximize her cognitive function and overall functional independence.     Care Partner:  Caregiver Able to Provide Assistance: Yes  Type of Caregiver Assistance: Cognitive  Recommendation:  Home health SLP;24 hour supervision/assistance  Rationale for SLP Follow Up: Maximize cognitive function and independence   Equipment: N/A   Reasons for discharge: Treatment goals met   Patient/Family Agrees with Progress Made and Goals  Achieved: Yes   Function:  Cognition Comprehension Comprehension assist level: Follows complex conversation/direction with extra time/assistive device  Expression   Expression assist level: Expresses complex ideas: With extra time/assistive device  Social Interaction Social Interaction assist level: Interacts appropriately with others - No medications needed.  Problem Solving Problem solving assist level: Solves complex 90% of the time/cues < 10% of the time  Memory Memory assist level: Recognizes or recalls 90% of the time/requires cueing < 10% of the time   Pain No/denies pain   Meredeth Ide  SLP - Student 03/04/2018, 12:28 PM

## 2018-03-04 NOTE — Progress Notes (Signed)
Occupational Therapy Session Note  Patient Details  Name: [name] MRN: [id number] Date of Birth: Dec 26, 1950  Today's Date: 03/04/2018 OT Individual Time: 1000-1057 and 1300-1340 OT Individual Time Calculation (min): 57 min and 40 min    Short Term Goals: Week 1:  OT Short Term Goal 1 (Week 1): STGs=LTGs due to ELOS  Skilled Therapeutic Interventions/Progress Updates:   Session 1: Upon entering the room, pt seated on EOB awaiting therapist arrival. Pt agreeable to OT intervention. Pt ambulating in room to obtain all needed items for bathing and dressing at overall mod I level. Pt given supervision for transfer onto TTB while bathing at shower level for safety. Pt drying self and picking up towels from bathroom floor at mod I level and then dressing from EOB independently. Pt standing at sink for all grooming tasks at mod I level as well. Pt made mod I in room as she safely demonstrates throughout session. Pt remained in room with call bell and all needed items within reach.   Session 2: Upon entering the room, pt reporting increased pain and muscle spasms. RN notified and provided medication during this session. Pt declined to exit bed secondary to pain. OT reviewed discharge recommendations and equipment needed for safety with pt verbalizing understanding and asking questions as appropriate. Call bell and all needed items within reach upon exiting the room.     Therapy Documentation Precautions:  Precautions Precautions: Fall Precaution Comments: Mild Lt hemi Restrictions Weight Bearing Restrictions: No General:   Vital Signs: Oxygen Therapy O2 Device: Room Air Pain: Pain Assessment Pain Scale: 0-10 Pain Score: 0-No pain ADL: ADL ADL Comments: Please see functional navigator for ADL status  See Function Navigator for Current Functional Status.   Therapy/Group: Individual Therapy  [name] 03/04/2018, 11:00 AM

## 2018-03-04 NOTE — Progress Notes (Signed)
Occupational Therapy Discharge Summary  Patient Details  Name: [name] MRN: [id number] Date of Birth: November 25, 1950     Patient has met 65 of 11 long term goals due to improved activity tolerance, improved balance, ability to compensate for deficits, functional use of  RIGHT upper extremity, improved attention, improved awareness and improved coordination.  Patient to discharge at overall mod I overall with supervision for shower transfer level.  Caregiver not present for education.   Reasons goals not met: all goals met  Recommendation:  Patient will benefit from ongoing skilled OT services in home health setting to continue to advance functional skills in the area of BADL and iADL.  Equipment: TTB  Reasons for discharge: treatment goals met  Patient/family agrees with progress made and goals achieved: Yes   OT Discharge Precautions/Restrictions  Precautions Precautions: Fall Precaution Comments: Mild Lt hemi Restrictions Weight Bearing Restrictions: No Pain Pain Assessment Pain Scale: 0-10 Pain Score: 0-No pain Faces Pain Scale: No hurt PAINAD (Pain Assessment in Advanced Dementia) Breathing: normal Negative Vocalization: none Facial Expression: smiling or inexpressive Body Language: relaxed Consolability: no need to console PAINAD Score: 0 ADL ADL ADL Comments: Please see functional navigator for ADL status Vision Baseline Vision/History: Wears glasses Wears Glasses: Reading only Patient Visual Report: No change from baseline Vision Assessment?: No apparent visual deficits Cognition Overall Cognitive Status: Impaired/Different from baseline Arousal/Alertness: Awake/alert Orientation Level: Oriented X4 Attention: Alternating Sustained Attention: Appears intact Selective Attention: Appears intact Selective Attention Impairment: Functional complex;Verbal complex Alternating Attention: Impaired Alternating Attention Impairment: Verbal complex;Functional  complex Memory: Impaired Memory Impairment: Decreased recall of new information;Storage deficit;Retrieval deficit;Decreased short term memory Decreased Short Term Memory: Verbal basic;Functional basic Awareness: Impaired Awareness Impairment: Anticipatory impairment Problem Solving: Impaired Problem Solving Impairment: Verbal complex;Functional complex Executive Function: Self Monitoring;Self Correcting Self Monitoring: Impaired Self Monitoring Impairment: Verbal basic;Functional basic Self Correcting: Impaired Self Correcting Impairment: Verbal basic;Functional basic Safety/Judgment: Appears intact Sensation Sensation Light Touch: Appears Intact Hot/Cold: Appears Intact Proprioception: Appears Intact Coordination Gross Motor Movements are Fluid and Coordinated: Yes Fine Motor Movements are Fluid and Coordinated: Yes Mobility  Bed Mobility Bed Mobility: Rolling Right;Rolling Left;Supine to Sit;Sit to Supine Rolling Right: 6: Modified independent (Device/Increase time) Rolling Left: 6: Modified independent (Device/Increase time) Supine to Sit: 6: Modified independent (Device/Increase time) Sit to Supine: 6: Modified independent (Device/Increase time) Transfers Transfers: Sit to Stand;Stand to Sit Sit to Stand: 6: Modified independent (Device/Increase time) Stand to Sit: 6: Modified independent (Device/Increase time)  Trunk/Postural Assessment  Cervical Assessment Cervical Assessment: Within Functional Limits Thoracic Assessment Thoracic Assessment: Within Functional Limits Lumbar Assessment Lumbar Assessment: Within Functional Limits  Balance Balance Balance Assessed: (with external perturbations, bil ankle strategy and hip strategy noted; no stepping strategy) Extremity/Trunk Assessment RUE Assessment RUE Assessment: Within Functional Limits LUE Assessment LUE Assessment: Exceptions to WFL(3-/5 gross strength through)   See Function Navigator for Current Functional  Status.  [name] 03/04/2018, 12:54 PM

## 2018-03-04 NOTE — Telephone Encounter (Signed)
Hospital f/u per [facility] re[facility]; patient appt 03/18/2018 1015am/NW

## 2018-03-04 NOTE — Progress Notes (Signed)
Physical Therapy Discharge Summary  Patient Details  Name: [name] MRN: [id number] Date of Birth: 01-06-1951  Today's Date: 03/04/2018 PT Individual Time: 1100-1130 PT Individual Time Calculation (min): 30 min    Patient has met 10 of 10 long term goals due to improved activity tolerance, improved balance, improved postural control, ability to compensate for deficits and functional use of  left upper extremity and left lower extremity.  Patient to discharge at an ambulatory level Modified Independent.   Patient's care partner no needed to provide continuous assistance at discharge; pt's daughter will check in her daily and assist with shopping.  Pt expressed the concern of not being able to drive; PT recommended she talk to [facility] about SCAT services in [facility].  Reasons goals not met: n/a  Recommendation:  Patient will benefit from ongoing skilled PT services in home health setting to continue to advance safe functional mobility, address ongoing impairments in high level balance, LUE/LE motor control, and minimize fall risk.  Equipment: No equipment provided  Reasons for discharge: treatment goals met and discharge from hospital  Patient/family agrees with progress made and goals achieved: Yes  PT Discharge Discussed falls risk at length with pt, and use of 911 services if needed.  Pt performed a safe floor transfer to floor mat , modified independent.  Pt transferred to high bed in ADL apt and simulated car transfer,  modified independent.  Gait on level tile , up/down ramp and up/down 12 steps 2 rails modified independent.  Gait on mulched ground with supervision.  Pt reported that she does not have to negotiate grass or mulch between car and house.  Pt retrieved small item from floor modified independent, without dizziness. Pt left sitting on EOB in room, with all needs at hand.   Precautions/Restrictions Precautions Precautions: Fall Precaution Comments: Mild Lt  hemi Restrictions Weight Bearing Restrictions: No Vital Signs Therapy Vitals Temp: 98.6 F (37 C) Temp Source: Oral Pulse Rate: 86 BP: 120/62 Patient Position (if appropriate): Lying Oxygen Therapy SpO2: 98 % Pain Pain Assessment Pain Scale: 0-10 Pain Score: 0-No pain Vision/Perception - wears reading glasses; no change from baseline    Cognition   Sensation Sensation Light Touch: Appears Intact Hot/Cold: Appears Intact Proprioception: Appears Intact Coordination Gross Motor Movements are Fluid and Coordinated: Yes Fine Motor Movements are Fluid and Coordinated: Yes Heel Shin Test: WFL BLE  Motor  Motor Motor: Hemiplegia Motor - Skilled Clinical Observations: mild hemiplegia on the L. UE>LE.   Mobility Bed Mobility Bed Mobility: Rolling Right;Rolling Left;Supine to Sit;Sit to Supine Rolling Right: 6: Modified independent (Device/Increase time) Rolling Left: 6: Modified independent (Device/Increase time) Supine to Sit: 6: Modified independent (Device/Increase time) Sit to Supine: 6: Modified independent (Device/Increase time) Transfers Sit to Stand: 6: Modified independent (Device/Increase time) Stand to Sit: 6: Modified independent (Device/Increase time) Locomotion  Ambulation Ambulation: Yes Ambulation/Gait Assistance: 6: Modified independent (Device/Increase time) Ambulation Distance (Feet): 200 Feet Assistive device: None Gait Gait: Yes Gait Pattern: Impaired Gait Pattern: Wide base of support Gait velocity: 10 seconds 10 m walk test; 3.28'/sec Stairs / Additional Locomotion Stairs: Yes Stairs Assistance: 6: Modified independent (Device/Increase time) Stair Management Technique: Two rails Number of Stairs: 12 Height of Stairs: 6(and 3) Ramp: 6: Modified independent (Device) Curb: Not tested (comment) Wheelchair Mobility Wheelchair Mobility: No  Trunk/Postural Assessment  Cervical Assessment Cervical Assessment: Within Functional Limits Thoracic  Assessment Thoracic Assessment: Within Functional Limits Lumbar Assessment Lumbar Assessment: Within Functional Limits  Balance Balance Balance Assessed:  Yes Static Sitting Balance Static Sitting - Level of Assistance: 6: Modified independent (Device/Increase time) Dynamic Sitting Balance Dynamic Sitting - Level of Assistance: 6: Modified independent (Device/Increase time) Static Standing Balance Static Standing - Level of Assistance: 6: Modified independent (Device/Increase time) Dynamic Standing Balance Dynamic Standing - Level of Assistance: 6: Modified independent (Device/Increase time)  Berg Balance Assessment on 02/26/18 = 45.  In standing given external perturbations 4/22 in all directions, pt demonstrated bil ankle strategies, bil hip strategies, absent stepping strategies.    Extremity Assessment  RUE Assessment RUE Assessment: Within Functional Limits LUE Assessment LUE Assessment: Exceptions to WFL(3-/5 gross strength through) RLE Assessment RLE Assessment: Within Functional Limits LLE Assessment LLE Assessment: Exceptions to WFL(tight heel cords) LLE Strength LLE Overall Strength Comments: grossly in sitting: hip, knee, ankle grossly 4 to 4+/5    See Function Navigator for Current Functional Status.  Girolamo Lortie 03/04/2018, 4:54 PM

## 2018-03-04 NOTE — Progress Notes (Signed)
Social Work Patient ID: [name], female   DOB: Oct 29, 1951, 67 y.o.   MRN: [id number]   Have reviewed pt's functional status with therapies today and all report pt on track for mod ind goals.  Pt and daughter both feel comfortable with pt d/c home alone.  I am referring for [facility] therapies and have discussed process to get signed up for transportation assistance via Medicaid.    Jovonta Levit, LCSW

## 2018-03-05 ENCOUNTER — Encounter (HOSPITAL_COMMUNITY): Payer: Self-pay | Admitting: Internal Medicine

## 2018-03-05 ENCOUNTER — Ambulatory Visit (HOSPITAL_COMMUNITY): Admit: 2018-03-05 | Payer: Medicaid Other | Admitting: Internal Medicine

## 2018-03-05 ENCOUNTER — Encounter (HOSPITAL_COMMUNITY)
Admission: RE | Disposition: A | Discharge status: Home / Self Care | Payer: Self-pay | Source: Ambulatory Visit | Attending: Internal Medicine

## 2018-03-05 ENCOUNTER — Ambulatory Visit (HOSPITAL_COMMUNITY)
Admission: RE | Admit: 2018-03-05 | Discharge: 2018-03-05 | Disposition: A | Discharge status: Home / Self Care | Payer: Medicare HMO | Source: Ambulatory Visit | Attending: Internal Medicine | Admitting: Internal Medicine

## 2018-03-05 DIAGNOSIS — K219 Gastro-esophageal reflux disease without esophagitis: Secondary | ICD-10-CM | POA: Diagnosis not present

## 2018-03-05 DIAGNOSIS — Z7902 Long term (current) use of antithrombotics/antiplatelets: Secondary | ICD-10-CM | POA: Insufficient documentation

## 2018-03-05 DIAGNOSIS — Z794 Long term (current) use of insulin: Secondary | ICD-10-CM | POA: Insufficient documentation

## 2018-03-05 DIAGNOSIS — I6389 Other cerebral infarction: Secondary | ICD-10-CM | POA: Insufficient documentation

## 2018-03-05 DIAGNOSIS — I639 Cerebral infarction, unspecified: Secondary | ICD-10-CM | POA: Diagnosis present

## 2018-03-05 DIAGNOSIS — I10 Essential (primary) hypertension: Secondary | ICD-10-CM | POA: Insufficient documentation

## 2018-03-05 DIAGNOSIS — E1151 Type 2 diabetes mellitus with diabetic peripheral angiopathy without gangrene: Secondary | ICD-10-CM | POA: Diagnosis not present

## 2018-03-05 DIAGNOSIS — I2510 Atherosclerotic heart disease of native coronary artery without angina pectoris: Secondary | ICD-10-CM | POA: Diagnosis not present

## 2018-03-05 DIAGNOSIS — E785 Hyperlipidemia, unspecified: Secondary | ICD-10-CM | POA: Diagnosis not present

## 2018-03-05 DIAGNOSIS — E559 Vitamin D deficiency, unspecified: Secondary | ICD-10-CM | POA: Diagnosis not present

## 2018-03-05 DIAGNOSIS — E039 Hypothyroidism, unspecified: Secondary | ICD-10-CM | POA: Diagnosis not present

## 2018-03-05 DIAGNOSIS — N289 Disorder of kidney and ureter, unspecified: Secondary | ICD-10-CM | POA: Insufficient documentation

## 2018-03-05 DIAGNOSIS — Z87891 Personal history of nicotine dependence: Secondary | ICD-10-CM | POA: Insufficient documentation

## 2018-03-05 HISTORY — PX: LOOP RECORDER INSERTION: EP1214

## 2018-03-05 LAB — GLUCOSE, CAPILLARY
GLUCOSE-CAPILLARY: 146 mg/dL — AB (ref 65–99)
Glucose-Capillary: 191 mg/dL — ABNORMAL HIGH (ref 65–99)

## 2018-03-05 SURGERY — LOOP RECORDER INSERTION

## 2018-03-05 MED ORDER — CILOSTAZOL 100 MG PO TABS: 100.0000 mg | ORAL_TABLET | Freq: Two times a day (BID) | ORAL | 0 refills | Status: DC

## 2018-03-05 MED ORDER — CLOPIDOGREL BISULFATE 75 MG PO TABS: 75.0000 mg | ORAL_TABLET | Freq: Every morning | ORAL | 0 refills | Status: DC

## 2018-03-05 MED ORDER — GEMFIBROZIL 600 MG PO TABS: 600.0000 mg | ORAL_TABLET | Freq: Two times a day (BID) | ORAL | 0 refills | Status: DC

## 2018-03-05 MED ORDER — LOSARTAN POTASSIUM 100 MG PO TABS: 100.0000 mg | ORAL_TABLET | Freq: Every day | ORAL | 0 refills | Status: DC

## 2018-03-05 MED ORDER — INSULIN ISOPHANE & REGULAR (HUMAN 70-30)100 UNIT/ML KWIKPEN: 30.0000 [IU] | PEN_INJECTOR | Freq: Every day | SUBCUTANEOUS | 8 refills | Status: DC

## 2018-03-05 MED ORDER — ASPIRIN 325 MG PO TABS: 325.0000 mg | ORAL_TABLET | Freq: Every day | ORAL | 0 refills | Status: DC

## 2018-03-05 MED ORDER — LIDOCAINE-EPINEPHRINE 1 %-1:100000 IJ SOLN
INTRAMUSCULAR | Status: AC
  Filled 2018-03-05: qty 1

## 2018-03-05 MED ORDER — LIDOCAINE-EPINEPHRINE 1 %-1:100000 IJ SOLN
INTRAMUSCULAR | Status: DC | PRN
  Administered 2018-03-05: 20 mL

## 2018-03-05 MED ORDER — ACETAMINOPHEN 325 MG PO TABS: 325.0000 mg | ORAL_TABLET | ORAL | Status: DC | PRN

## 2018-03-05 SURGICAL SUPPLY — 2 items
LOOP REVEAL LINQSYS (Prosthesis & Implant Heart) ×1 IMPLANT
PACK LOOP INSERTION (CUSTOM PROCEDURE TRAY) ×2 IMPLANT

## 2018-03-05 NOTE — Progress Notes (Signed)
Pt discharged to home with daughter. Discharge instructions given to pt and daughter by Algis Liming, PA with verbal understanding.

## 2018-03-05 NOTE — Discharge Summary (Signed)
Physician Discharge Summary  Patient ID: [name] MRN: [id number] DOB/AGE: [birth year] 67 y.o.  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 Left-sided facial droop, left-sided weakness, and right temporal headache. EXAM: CT HEAD WITHOUT CONTRAST TECHNIQUE: Contiguous axial images were obtained from the base of the skull through the vertex without intravenous contrast. COMPARISON:  02/10/2018 FINDINGS: Brain: Diffuse cerebral atrophy. Low-attenuation changes in the deep white matter suggesting small vessel ischemia. Increased asymmetrical low-attenuation change in the right periventricular white matter and insular region since previous study. This may represent area of acute infarct. MRI would be more sensitive for detection of acute infarct. No significant mass effect or midline shift. Basal cisterns are not effaced. No acute intracranial hemorrhage. Vascular: Intracranial arterial vascular calcifications are present. Skull: Calvarium appears intact. No acute depressed skull fractures. Sinuses/Orbits: No acute finding. Other: None. IMPRESSION: Suggestion of acute infarct in the right periventricular and insular region. Consider MRI correlation if clinically indicated. No acute intracranial hemorrhage or mass effect. Electronically Signed   By: Lucienne Capers M.D.   On: 02/16/2018 00:21   Ct Head Wo Contrast  Result Date: 02/10/2018 CLINICAL DATA:  Acute severe headache, worst headache of life for several days.  No reported injury. EXAM: CT HEAD WITHOUT CONTRAST TECHNIQUE: Contiguous axial images were obtained from the base of the skull through the vertex without intravenous contrast. COMPARISON:  05/21/2015 head CT FINDINGS: Brain: Stable chronic small left caudate head lacune. No evidence of parenchymal hemorrhage or extra-axial fluid collection. No mass lesion, mass effect, or midline shift. No CT evidence of acute infarction. Nonspecific mild subcortical and periventricular white matter hypodensity, most in keeping with chronic small vessel ischemic change. Cerebral volume is age appropriate. No ventriculomegaly. Vascular: No acute abnormality. Skull: No evidence of calvarial fracture. Sinuses/Orbits: The visualized paranasal sinuses are essentially clear. Other:  The mastoid air cells are unopacified. IMPRESSION: 1.  No evidence of acute intracranial abnormality. 2. Mild chronic small vessel ischemic changes in the cerebral white matter. 3. Old left basal ganglia lacune. Electronically Signed   By: Ilona Sorrel M.D.   On: 02/10/2018 21:08   Mr Jodene Nam Head Wo Contrast  Result Date: 02/25/2018 CLINICAL DATA:  Follow-up stroke. History of hypertension, hyperlipidemia, diabetes and stroke. EXAM: MRA HEAD WITHOUT CONTRAST TECHNIQUE: Angiographic images of the Circle of Willis were obtained using MRA technique without intravenous contrast. COMPARISON:  MRA head February 16, 2018 FINDINGS: ANTERIOR CIRCULATION: Normal flow related enhancement of the included cervical, petrous, cavernous and supraclinoid internal carotid arteries. Patent anterior communicating artery. Patent anterior and middle cerebral arteries. Severe tandem stenosis RIGHT A2 and included A3 segments. Severe stenosis distal LEFT M1 segment and, LEFT M2 origin, superior division. Severe stenosis RIGHT M2 segment, superior division. No large vessel occlusion, aneurysm. POSTERIOR CIRCULATION: LEFT vertebral artery is dominant. Basilar artery is patent, with normal  flow related enhancement of the main branch vessels. Patent posterior cerebral arteries, moderate tandem stenoses. Small bilateral posterior communicating arteries present. No large vessel occlusion, flow limiting stenosis,  aneurysm. ANATOMIC VARIANTS: None. Source images and MIP images were reviewed. IMPRESSION: 1. No emergent large vessel. 2. Multifocal severe stenosis RIGHT anterior and bilateral middle cerebral arteries most compatible with atherosclerosis. 3. Moderate stenoses bilateral posterior cerebral arteries. Electronically Signed   By: Elon Alas M.D.   On: 02/25/2018 05:43   Mr Brain Wo Contrast  Result Date: 02/24/2018 CLINICAL DATA:  Confusion, acute, unexplained. EXAM: MRI HEAD WITHOUT CONTRAST TECHNIQUE: Multiplanar, multiecho pulse sequences of the brain and surrounding structures were obtained without intravenous contrast. COMPARISON:  02/16/2018 FINDINGS: Brain: Acute to subacute subcentimeter infarcts in the right corona radiata and juxta cortical frontal white matter that are more numerous than seen on comparison. These are all in the right MCA distribution. Chronic small vessel ischemia with remote lacunar infarct in the left caudate head. Remote right lateral pontine infarct. Chronic small vessel ischemic type gliosis in the cerebral white matter that is comparatively mild. No hemorrhage, hydrocephalus, or masslike finding. Brain atrophy. Vascular: Major flow voids are preserved. Skull and upper cervical spine: No evidence of marrow lesion Sinuses/Orbits: Negative IMPRESSION: 1. Small acute to subacute infarcts clustered in the right corona radiata and frontal white matter that have increased in number from 02/16/2018, suspicious for underlying flow limiting stenosis or embolic source. 2. Chronic small vessel ischemia with remote right pontine and left caudate infarct. Electronically Signed   By: Monte Fantasia M.D.   On: 02/24/2018 17:59   Mr Brain Wo Contrast  Result Date:  02/16/2018 CLINICAL DATA:  67 y/o F; peripheral vascular disease and stroke affecting the left side. Recent progressive intermittent left-sided weakness. Episode of seizure-like activity. EXAM: MRI HEAD WITHOUT CONTRAST MRA HEAD WITHOUT CONTRAST TECHNIQUE: Multiplanar, multiecho pulse sequences of the brain and surrounding structures were obtained without intravenous contrast. Angiographic images of the head were obtained using MRA technique without contrast. COMPARISON:  02/15/2018 CT head. 01/28/2011 MRI and MRA of the head. FINDINGS: MRI HEAD FINDINGS Brain: 9 mm focus of reduced diffusion in the right mid corona radiata with few additional punctate adjacent foci compatible with acute/early subacute infarction. No associated hemorrhage or mass effect. Fewnonspecific foci of T2 FLAIR hyperintense signal abnormality in subcortical and periventricular white matter are compatible withmildchronic microvascular ischemic changes for age. Mildbrain parenchymal volume loss. Small chronic infarctions are present within the right genu of corpus callosum, right body of corpus callosum, and right hemi pons. No extra-axial collection, hydrocephalus, or effacement of basilar cisterns. Vascular: As below. Skull and upper cervical spine: Normal marrow signal. Sinuses/Orbits: Negative. Other: None. MRA HEAD FINDINGS Severe motion artifact. Suboptimal assessment for stenosis or small aneurysm. No large vessel occlusion or large aneurysm identified. IMPRESSION: MRI head: 1. Subcentimeter acute/early subacute infarction and right mid corona radiata with few adjacent punctate foci. No associated hemorrhage or mass effect. 2. Mild chronic microvascular ischemic changes and mild parenchymal volume loss of the brain. 3. Small chronic infarcts in right genu of corpus callosum, right body of corpus callosum, and right hemi pons. MRA head: Severe motion artifact. Suboptimal assessment for stenosis or small aneurysm. No large vessel occlusion  or large aneurysm identified. These results will be called to the ordering clinician or representative by the Radiologist Assistant, and communication documented in the PACS or zVision Dashboard. Electronically Signed   By: Kristine Garbe M.D.   On: 02/16/2018 03:50   Mr Virgel Paling NI Contrast  Result Date: 02/16/2018 CLINICAL DATA:  67 y/o F; peripheral vascular disease and stroke affecting the left side. Recent progressive  intermittent left-sided weakness. Episode of seizure-like activity. EXAM: MRI HEAD WITHOUT CONTRAST MRA HEAD WITHOUT CONTRAST TECHNIQUE: Multiplanar, multiecho pulse sequences of the brain and surrounding structures were obtained without intravenous contrast. Angiographic images of the head were obtained using MRA technique without contrast. COMPARISON:  02/15/2018 CT head. 01/28/2011 MRI and MRA of the head. FINDINGS: MRI HEAD FINDINGS Brain: 9 mm focus of reduced diffusion in the right mid corona radiata with few additional punctate adjacent foci compatible with acute/early subacute infarction. No associated hemorrhage or mass effect. Fewnonspecific foci of T2 FLAIR hyperintense signal abnormality in subcortical and periventricular white matter are compatible withmildchronic microvascular ischemic changes for age. Mildbrain parenchymal volume loss. Small chronic infarctions are present within the right genu of corpus callosum, right body of corpus callosum, and right hemi pons. No extra-axial collection, hydrocephalus, or effacement of basilar cisterns. Vascular: As below. Skull and upper cervical spine: Normal marrow signal. Sinuses/Orbits: Negative. Other: None. MRA HEAD FINDINGS Severe motion artifact. Suboptimal assessment for stenosis or small aneurysm. No large vessel occlusion or large aneurysm identified. IMPRESSION: MRI head: 1. Subcentimeter acute/early subacute infarction and right mid corona radiata with few adjacent punctate foci. No associated hemorrhage or mass  effect. 2. Mild chronic microvascular ischemic changes and mild parenchymal volume loss of the brain. 3. Small chronic infarcts in right genu of corpus callosum, right body of corpus callosum, and right hemi pons. MRA head: Severe motion artifact. Suboptimal assessment for stenosis or small aneurysm. No large vessel occlusion or large aneurysm identified. These results will be called to the ordering clinician or representative by the Radiologist Assistant, and communication documented in the PACS or zVision Dashboard. Electronically Signed   By: Kristine Garbe M.D.   On: 02/16/2018 03:50    Labs:  Basic Metabolic Panel: BMP Latest Ref Rng & Units 03/04/2018 02/27/2018 02/26/2018  Glucose 65 - 99 mg/dL 168(H) 145(H) 203(H)  BUN 6 - 20 mg/dL 28(H) 23(H) 25(H)  Creatinine 0.44 - 1.00 mg/dL 1.37(H) 1.33(H) 1.32(H)  BUN/Creat Ratio 12 - 28 - - -  Sodium 135 - 145 mmol/L 136 139 140  Potassium 3.5 - 5.1 mmol/L 3.8 3.8 4.1  Chloride 101 - 111 mmol/L 106 110 108  CO2 22 - 32 mmol/L 18(L) 21(L) 22  Calcium 8.9 - 10.3 mg/dL 9.1 9.1 9.2    CBC: CBC Latest Ref Rng & Units 03/04/2018 02/27/2018 02/23/2018  WBC 4.0 - 10.5 K/uL 5.4 4.7 4.9  Hemoglobin 12.0 - 15.0 g/dL 9.7(L) 9.5(L) 10.8(L)  Hematocrit 36.0 - 46.0 % 29.0(L) 28.9(L) 33.0(L)  Platelets 150 - 400 K/uL 326 273 289   CBG: CBG (last 3)  No results for input(s): GLUCAP in the last 72 hours.   Brief HPI:   [name] is a 67 year old female with history of T2DM, GERD, PVD, CVA X 2 who was admitted on 02/16/2018 with 1 week history of intermittent left-sided weakness as well as multiple episodes of generalized seizure.  She had witnessed seizure with gaze to the left and was loaded with Dilantin and has been started on Keppra for management of new onset seizures.  MRI of brain done showing right basal ganglia infarct and only subacute/acute infarct and right mid corona radiata with few punctate foci.  Dr. Leonie Man felt that stroke was due to  SVD and recommended continuing Plavix and Pletal.    She continued to have persistent seizure type activity despite titration in medications and long-term EEG was negative. Symptoms felt to be due to muscle  spasms and baclofen added to manage symptoms. Dr. Erlinda Hong felt that symptoms were due to muscle spasms vs some volitional movement. Patient with resultant unsteady gait with impulsivity and poor safety awareness. CIR recommended due to functional deficits.   Hospital Course: [name] was admitted to rehab 02/22/2018 for inpatient therapies to consist of PT, ST and OT at least three hours five days a week. Past admission physiatrist, therapy team and rehab RN have worked together to provide customized collaborative inpatient rehab. She was maintained on ASA/Plavix for stroke prophylaxis. She developed acute confusion on 4/14 and MRI brain was repeated showing increase in number of acute/subacute infarcts in right corona radiata and frontal white matter. Neurology was consulted and recommende MRA brain which was negative for emergent large vessel and multifocal severe stenosis noted in right anterior and bilateral MCA due to atherosclerosis.  Dr. Leonie Man felt that current films were of better quality and stroke was due to large vessel disease. He recommended continuing ASA/Plavix at discharge as well as  TEE/Loop.   TEE done was negative for SOE or PFO. Her mentation improved to baseline. Baclofen was discontinued due to lethargy and no further spasms noted during her stay.  She was briefly hydrated with IVF with improvement in SCr to 1.37. Blood pressures were stable on losartan daily.  Diabetes has been monitored with ac/hs checks and BS has improved with titration of 70/30 insulin to 30 units bid. CBC showed drop in H/H to 9.7 due to hydration. No signs of bleeding noted.  She has progressed to modified independent level and will continue to receive follow up [place], [place] and [facility]ST by [place] after  discharge. Loop recorder was placed at short stay by Dr. Lovena Le prior to discharge to home on 03/05/18     Rehab course: During patient's stay in rehab weekly team conferences were held to monitor patient's progress, set goals and discuss barriers to discharge. At admission, patient required min assist with basic self care tasks and min assist with mobility.  She exhibited moderate cognitive linguistic impairments with deficits in basic/mildly complex tasks. MoCA score 16/30. She  has had improvement in activity tolerance, balance, postural control as well as ability to compensate for deficits. She has had improvement in functional use LUE  and LLE as well as improvement in awareness. She is able to complete ADL tasks at modified independent level. She is modified independent for transfers and is ambulating 200' without AD. She requires supervision for mildly complex problem solving, recall and selective attention in mildly distracting environment. Family education was completed regarding all aspects of cognition and mobility.    Disposition: Home  Diet: Heart healthy/Carb modified   Special Instructions: 1. Family to assist with medication management.  2. Call DSS to sign up for Medicaid transportation needs. 3. Needs CBC/BMET rechecked in 2-3 weeks.   Discharge Instructions    Ambulatory referral to Neurology   Complete by:  As directed    An appointment is requested in approximately: 4-6 weeks   Ambulatory referral to Physical Medicine Rehab   Complete by:  As directed    1-2 weeks transitional care appt     Allergies as of 03/05/2018      Reactions   Dilantin [phenytoin Sodium Extended]    Rash/itching   Insulins Rash   Humalog 75/25 only   Statins    "sick to my stomach"      Medication List    STOP taking these medications  baclofen 10 MG tablet Commonly known as:  LIORESAL   hydrochlorothiazide 25 MG tablet Commonly known as:  HYDRODIURIL     TAKE these medications    acetaminophen 325 MG tablet Commonly known as:  TYLENOL Take 1-2 tablets (325-650 mg total) by mouth every 4 (four) hours as needed for mild pain.   aspirin 325 MG tablet Take 1 tablet (325 mg total) by mouth daily.   BD INSULIN SYRINGE ULTRAFINE 31G X 15/64" 0.5 ML Misc Generic drug:  Insulin Syringe-Needle U-100 USE TO INJECT INSULIN TWO TIMES A DAY   cilostazol 100 MG tablet Commonly known as:  PLETAL Take 1 tablet (100 mg total) by mouth 2 (two) times daily.   clopidogrel 75 MG tablet Commonly known as:  PLAVIX Take 1 tablet (75 mg total) by mouth every morning.   gemfibrozil 600 MG tablet Commonly known as:  LOPID Take 1 tablet (600 mg total) by mouth 2 (two) times daily.   glucose blood test strip Commonly known as:  ACCU-CHEK AVIVA PLUS Use to check blood sugars twice a day.Dx Code: E11.22.   Insulin Isophane & Regular Human (70-30) 100 UNIT/ML PEN Commonly known as:  NOVOLIN 70/30 FLEXPEN Inject 30 Units into the skin daily. What changed:  how much to take   Insulin Pen Needle 31G X 5 MM Misc Commonly known as:  B-D UF III MINI PEN NEEDLES The patient is insulin requiring, ICD 10 code E11.9. The patient injects insulin 2 times per day.   levothyroxine 88 MCG tablet Commonly known as:  SYNTHROID, LEVOTHROID TAKE 1 TABLET BY MOUTH EVERY DAY BEFORE BREAKFAST   losartan 100 MG tablet Commonly known as:  COZAAR Take 1 tablet (100 mg total) by mouth daily.   MULTIVITAMIN ADULT PO Take 1 tablet by mouth once a week. On Wednesday   ranitidine 150 MG tablet Commonly known as:  ZANTAC TAKE 1 TABLET BY MOUTH EVERY DAY AS NEEDED      Follow-up Information    [name], MD Follow up on 03/18/2018.   Specialty:  Internal Medicine Why:  @ 10:15 am (hospital follow up appt) Contact information: [place] [address] [phone]        Meredith Staggers, MD Follow up.   Specialty:  Physical Medicine and Rehabilitation Why:  office will call  you with follow up appointment Contact information: [place] [address] [phone]        [facility] Follow up.   Specialty:  Neurology Why:  office will call you with follow up appointment Contact information: [address] [place] [phone]          Signed: Bary Leriche 03/12/2018, 4:02 PM

## 2018-03-05 NOTE — Progress Notes (Signed)
Social Work Discharge Note  The overall goal for the admission was met for:   Discharge location: Yes - home alone with intermittent support of daughter  Length of Stay: Yes - 11 days  Discharge activity level: Yes - modified independent overall  Home/community participation: Yes  Services provided included: MD, RD, PT, OT, SLP, RN, TR, Pharmacy, [place]: Medicare and Medicaid  Follow-up services arranged: Home Health: PT, OT, ST via [place], DME: tub bench via [place] and Patient/Family has no preference for HH/DME agencies  Comments (or additional information):  Provided pt and daughter with instructions on signing up for Medicaid transportation  Patient/Family verbalized understanding of follow-up arrangements: Yes  Individual responsible for coordination of the follow-up plan: pt  Confirmed correct DME delivered: Samuele Storey 03/05/2018    Mari Battaglia

## 2018-03-05 NOTE — Plan of Care (Signed)
  Problem: RH SAFETY Goal: RH STG ADHERE TO SAFETY PRECAUTIONS W/ASSISTANCE/DEVICE Description STG Adhere to Safety Precautions With min Assistance/Device.  Outcome: Progressing   Problem: RH PAIN MANAGEMENT Goal: RH STG PAIN MANAGED AT OR BELOW PT'S PAIN GOAL Description Less than 3 out of 10  Outcome: Progressing   Problem: Consults Goal: RH STROKE PATIENT EDUCATION Description See Patient Education module for education specifics  Outcome: Progressing

## 2018-03-05 NOTE — Discharge Instructions (Signed)
° °  IMPLANTABLE LOOP RECORDER INSTRUCTION SHEET GIVEN

## 2018-03-05 NOTE — Consult Note (Addendum)
ELECTROPHYSIOLOGY CONSULT NOTE  Patient ID: [name] MRN: [id number], DOB/AGE: [birth year]   Admit date: 03/05/2018 Date of Consult: 03/05/2018  Primary Physician: [name], MD Primary Cardiologist: new to [facility] Reason for Consultation: Cryptogenic stroke; recommendations regarding Implantable Loop Recorder  History of Present Illness EP has been asked to evaluate [name] for placement of an implantable loop recorder to monitor for atrial fibrillation by Dr Leonie Man.  The patient was admitted on 03/05/2018 with left sided weakness and headache. Imaging demonstrated subacute right infarcts.  she has undergone workup for stroke including echocardiogram and carotid dopplers.  The patient has been monitored on telemetry which has demonstrated sinus rhythm with no arrhythmias.  TEE was without cause for stroke   Echocardiogram this admission demonstrated EF 60-65%, no RWMA, grade 1 diastolic dysfunction.  Lab work is reviewed.  Prior to admission, the patient denies chest pain, shortness of breath, dizziness, palpitations, or syncope.     Past Medical History:  Diagnosis Date  . Anemia   . Arteriosclerotic cardiovascular disease   . Claudication in peripheral vascular disease ([place])   . CVA (cerebral vascular accident) ([place]) 01/2011   affected left side  . Diabetes mellitus 2000  . GERD (gastroesophageal reflux disease)   . Hyperlipidemia   . Hypertension   . Hypothyroidism   . Renal insufficiency   . Vitamin D deficiency      Surgical History:  Past Surgical History:  Procedure Laterality Date  . CESAREAN SECTION    . LEFT HEART CATHETERIZATION WITH CORONARY ANGIOGRAM N/A 03/08/2012   Procedure: LEFT HEART CATHETERIZATION WITH CORONARY ANGIOGRAM;  Surgeon: Sinclair Grooms, MD;  Location: [facility] CATH LAB;  Service: Cardiovascular;  Laterality: N/A;  . LEFT HEART CATHETERIZATION WITH CORONARY ANGIOGRAM N/A 12/17/2012   Procedure: LEFT HEART CATHETERIZATION WITH  CORONARY ANGIOGRAM;  Surgeon: Laverda Page, MD;  Location: [facility] CATH LAB;  Service: Cardiovascular;  Laterality: N/A;  . LOWER EXTREMITY ANGIOGRAM N/A 12/17/2012   Procedure: LOWER EXTREMITY ANGIOGRAM;  Surgeon: Laverda Page, MD;  Location: [facility] CATH LAB;  Service: Cardiovascular;  Laterality: N/A;  . TEE WITHOUT CARDIOVERSION N/A 02/28/2018   Procedure: TRANSESOPHAGEAL ECHOCARDIOGRAM (TEE);  Surgeon: Jerline Pain, MD;  Location: [facility] ENDOSCOPY;  Service: Cardiovascular;  Laterality: N/A;  . TUBAL LIGATION       Medications Prior to Admission  Medication Sig Dispense Refill Last Dose  . acetaminophen (TYLENOL) 325 MG tablet Take 1-2 tablets (325-650 mg total) by mouth every 4 (four) hours as needed for mild pain.     Derrill Memo ON 03/06/2018] aspirin 325 MG tablet Take 1 tablet (325 mg total) by mouth daily. 100 tablet 0   . BD INSULIN SYRINGE ULTRAFINE 31G X 15/64" 0.5 ML MISC USE TO INJECT INSULIN TWO TIMES A DAY 100 each 11 Taking  . cilostazol (PLETAL) 100 MG tablet Take 1 tablet (100 mg total) by mouth 2 (two) times daily. 60 tablet 0   . clopidogrel (PLAVIX) 75 MG tablet Take 1 tablet (75 mg total) by mouth every morning. 30 tablet 0   . gemfibrozil (LOPID) 600 MG tablet Take 1 tablet (600 mg total) by mouth 2 (two) times daily. 60 tablet 0   . glucose blood (ACCU-CHEK AVIVA PLUS) test strip Use to check blood sugars twice a day.Dx Code: E11.22. 100 each 4 Taking  . Insulin Isophane & Regular Human (NOVOLIN 70/30 FLEXPEN) (70-30) 100 UNIT/ML PEN Inject 30 Units into the skin daily. 15 mL 8   .  Insulin Pen Needle (B-D UF III MINI PEN NEEDLES) 31G X 5 MM MISC The patient is insulin requiring, ICD 10 code E11.9. The patient injects insulin 2 times per day. 100 each 2   . levothyroxine (SYNTHROID, LEVOTHROID) 88 MCG tablet TAKE 1 TABLET BY MOUTH EVERY DAY BEFORE BREAKFAST 90 tablet 3 02/15/2018 at Unknown time  . liraglutide 18 MG/3ML SOPN Inject 0.2 mLs (1.2 mg total) into the skin daily. 6 mL 3  02/15/2018 at Unknown time  . losartan (COZAAR) 100 MG tablet Take 1 tablet (100 mg total) by mouth daily. 30 tablet 0   . Multiple Vitamins-Minerals (MULTIVITAMIN ADULT PO) Take 1 tablet by mouth once a week. On Wednesday   02/13/2018 at Unknown time  . ranitidine (ZANTAC) 150 MG tablet TAKE 1 TABLET BY MOUTH EVERY DAY AS NEEDED (Patient not taking: Reported on 02/16/2018) 30 tablet 4 Not Taking at Unknown time    Inpatient Medications:   Allergies:  Allergies  Allergen Reactions  . Dilantin [Phenytoin Sodium Extended]     Rash/itching  . Insulins Rash    Humalog 75/25 only  . Statins     "sick to my stomach"    Social History   Socioeconomic History  . Marital status: Single    Spouse name: Not on file  . Number of children: 1  . Years of education: 54th- [place]  . Highest education level: Not on file  Occupational History  . Occupation: Retired  Scientific laboratory technician  . Financial resource strain: Not on file  . Food insecurity:    Worry: Not on file    Inability: Not on file  . Transportation needs:    Medical: Not on file    Non-medical: Not on file  Tobacco Use  . Smoking status: Former Smoker    Last attempt to quit: 02/04/2011    Years since quitting: 7.0  . Smokeless tobacco: Never Used  Substance and Sexual Activity  . Alcohol use: No    Alcohol/week: 0.0 oz  . Drug use: No  . Sexual activity: Not on file  Lifestyle  . Physical activity:    Days per week: Not on file    Minutes per session: Not on file  . Stress: Not on file  Relationships  . Social connections:    Talks on phone: Not on file    Gets together: Not on file    Attends religious service: Not on file    Active member of club or organization: Not on file    Attends meetings of clubs or organizations: Not on file    Relationship status: Not on file  . Intimate partner violence:    Fear of current or ex partner: Not on file    Emotionally abused: Not on file    Physically abused: Not on file    Forced  sexual activity: Not on file  Other Topics Concern  . Not on file  Social History Narrative  . Not on file     Family History  Problem Relation Age of Onset  . Diabetes Brother   . Prostate cancer Brother   . Diabetes Mother   . Diabetes Father   . Diabetes Sister   . Esophageal cancer Neg Hx   . Rectal cancer Neg Hx   . Stomach cancer Neg Hx   . Colon cancer Neg Hx   . Breast cancer Neg Hx       Review of Systems: All other systems reviewed and are otherwise negative  except as noted above.  Physical Exam: Vitals:   03/05/18 1108  BP: (!) 171/59  Pulse: 82  Temp: 97.8 F (36.6 C)  TempSrc: Oral  SpO2: 99%  Weight: 170 lb (77.1 kg)  Height: 5\' 2"  (1.575 m)    GEN- The patient is well appearing, alert and oriented x 3 today.   Head- normocephalic, atraumatic Eyes-  Sclera clear, conjunctiva pink Ears- hearing intact Oropharynx- clear Neck- supple Lungs- Clear to ausculation bilaterally, normal work of breathing Heart- Regular rate and rhythm  GI- soft, NT, ND, + BS Extremities- no clubbing, cyanosis, or edema MS- no significant deformity or atrophy Skin- no rash or lesion Psych- euthymic mood, full affect   Labs:   Lab Results  Component Value Date   WBC 5.4 03/04/2018   HGB 9.7 (L) 03/04/2018   HCT 29.0 (L) 03/04/2018   MCV 88.4 03/04/2018   PLT 326 03/04/2018    Recent Labs  Lab 03/04/18 0629  NA 136  K 3.8  CL 106  CO2 18*  BUN 28*  CREATININE 1.37*  CALCIUM 9.1  GLUCOSE 168*     Radiology/Studies: Ct Head Wo Contrast  Result Date: 02/16/2018 CLINICAL DATA:  TIA symptoms since Sunday. Left-sided facial droop, left-sided weakness, and right temporal headache. EXAM: CT HEAD WITHOUT CONTRAST TECHNIQUE: Contiguous axial images were obtained from the base of the skull through the vertex without intravenous contrast. COMPARISON:  02/10/2018 FINDINGS: Brain: Diffuse cerebral atrophy. Low-attenuation changes in the deep white matter suggesting  small vessel ischemia. Increased asymmetrical low-attenuation change in the right periventricular white matter and insular region since previous study. This may represent area of acute infarct. MRI would be more sensitive for detection of acute infarct. No significant mass effect or midline shift. Basal cisterns are not effaced. No acute intracranial hemorrhage. Vascular: Intracranial arterial vascular calcifications are present. Skull: Calvarium appears intact. No acute depressed skull fractures. Sinuses/Orbits: No acute finding. Other: None. IMPRESSION: Suggestion of acute infarct in the right periventricular and insular region. Consider MRI correlation if clinically indicated. No acute intracranial hemorrhage or mass effect. Electronically Signed   By: Lucienne Capers M.D.   On: 02/16/2018 00:21   Ct Head Wo Contrast  Result Date: 02/10/2018 CLINICAL DATA:  Acute severe headache, worst headache of life for several days. No reported injury. EXAM: CT HEAD WITHOUT CONTRAST TECHNIQUE: Contiguous axial images were obtained from the base of the skull through the vertex without intravenous contrast. COMPARISON:  05/21/2015 head CT FINDINGS: Brain: Stable chronic small left caudate head lacune. No evidence of parenchymal hemorrhage or extra-axial fluid collection. No mass lesion, mass effect, or midline shift. No CT evidence of acute infarction. Nonspecific mild subcortical and periventricular white matter hypodensity, most in keeping with chronic small vessel ischemic change. Cerebral volume is age appropriate. No ventriculomegaly. Vascular: No acute abnormality. Skull: No evidence of calvarial fracture. Sinuses/Orbits: The visualized paranasal sinuses are essentially clear. Other:  The mastoid air cells are unopacified. IMPRESSION: 1.  No evidence of acute intracranial abnormality. 2. Mild chronic small vessel ischemic changes in the cerebral white matter. 3. Old left basal ganglia lacune. Electronically Signed    By: Ilona Sorrel M.D.   On: 02/10/2018 21:08   Mr Jodene Nam Head Wo Contrast  Result Date: 02/25/2018 CLINICAL DATA:  Follow-up stroke. History of hypertension, hyperlipidemia, diabetes and stroke. EXAM: MRA HEAD WITHOUT CONTRAST TECHNIQUE: Angiographic images of the Circle of Willis were obtained using MRA technique without intravenous contrast. COMPARISON:  MRA head February 16, 2018 FINDINGS: ANTERIOR CIRCULATION: Normal flow related enhancement of the included cervical, petrous, cavernous and supraclinoid internal carotid arteries. Patent anterior communicating artery. Patent anterior and middle cerebral arteries. Severe tandem stenosis RIGHT A2 and included A3 segments. Severe stenosis distal LEFT M1 segment and, LEFT M2 origin, superior division. Severe stenosis RIGHT M2 segment, superior division. No large vessel occlusion, aneurysm. POSTERIOR CIRCULATION: LEFT vertebral artery is dominant. Basilar artery is patent, with normal flow related enhancement of the main branch vessels. Patent posterior cerebral arteries, moderate tandem stenoses. Small bilateral posterior communicating arteries present. No large vessel occlusion, flow limiting stenosis,  aneurysm. ANATOMIC VARIANTS: None. Source images and MIP images were reviewed. IMPRESSION: 1. No emergent large vessel. 2. Multifocal severe stenosis RIGHT anterior and bilateral middle cerebral arteries most compatible with atherosclerosis. 3. Moderate stenoses bilateral posterior cerebral arteries. Electronically Signed   By: Elon Alas M.D.   On: 02/25/2018 05:43   Mr Brain Wo Contrast  Result Date: 02/24/2018 CLINICAL DATA:  Confusion, acute, unexplained. EXAM: MRI HEAD WITHOUT CONTRAST TECHNIQUE: Multiplanar, multiecho pulse sequences of the brain and surrounding structures were obtained without intravenous contrast. COMPARISON:  02/16/2018 FINDINGS: Brain: Acute to subacute subcentimeter infarcts in the right corona radiata and juxta cortical frontal  white matter that are more numerous than seen on comparison. These are all in the right MCA distribution. Chronic small vessel ischemia with remote lacunar infarct in the left caudate head. Remote right lateral pontine infarct. Chronic small vessel ischemic type gliosis in the cerebral white matter that is comparatively mild. No hemorrhage, hydrocephalus, or masslike finding. Brain atrophy. Vascular: Major flow voids are preserved. Skull and upper cervical spine: No evidence of marrow lesion Sinuses/Orbits: Negative IMPRESSION: 1. Small acute to subacute infarcts clustered in the right corona radiata and frontal white matter that have increased in number from 02/16/2018, suspicious for underlying flow limiting stenosis or embolic source. 2. Chronic small vessel ischemia with remote right pontine and left caudate infarct. Electronically Signed   By: Monte Fantasia M.D.   On: 02/24/2018 17:59   Mr Brain Wo Contrast  Result Date: 02/16/2018 CLINICAL DATA:  67 y/o F; peripheral vascular disease and stroke affecting the left side. Recent progressive intermittent left-sided weakness. Episode of seizure-like activity. EXAM: MRI HEAD WITHOUT CONTRAST MRA HEAD WITHOUT CONTRAST TECHNIQUE: Multiplanar, multiecho pulse sequences of the brain and surrounding structures were obtained without intravenous contrast. Angiographic images of the head were obtained using MRA technique without contrast. COMPARISON:  02/15/2018 CT head. 01/28/2011 MRI and MRA of the head. FINDINGS: MRI HEAD FINDINGS Brain: 9 mm focus of reduced diffusion in the right mid corona radiata with few additional punctate adjacent foci compatible with acute/early subacute infarction. No associated hemorrhage or mass effect. Fewnonspecific foci of T2 FLAIR hyperintense signal abnormality in subcortical and periventricular white matter are compatible withmildchronic microvascular ischemic changes for age. Mildbrain parenchymal volume loss. Small chronic  infarctions are present within the right genu of corpus callosum, right body of corpus callosum, and right hemi pons. No extra-axial collection, hydrocephalus, or effacement of basilar cisterns. Vascular: As below. Skull and upper cervical spine: Normal marrow signal. Sinuses/Orbits: Negative. Other: None. MRA HEAD FINDINGS Severe motion artifact. Suboptimal assessment for stenosis or small aneurysm. No large vessel occlusion or large aneurysm identified. IMPRESSION: MRI head: 1. Subcentimeter acute/early subacute infarction and right mid corona radiata with few adjacent punctate foci. No associated hemorrhage or mass effect. 2. Mild chronic microvascular ischemic changes and mild parenchymal volume loss of the brain. 3.  Small chronic infarcts in right genu of corpus callosum, right body of corpus callosum, and right hemi pons. MRA head: Severe motion artifact. Suboptimal assessment for stenosis or small aneurysm. No large vessel occlusion or large aneurysm identified. These results will be called to the ordering clinician or representative by the Radiologist Assistant, and communication documented in the PACS or zVision Dashboard. Electronically Signed   By: Kristine Garbe M.D.   On: 02/16/2018 03:50   Mr Virgel Paling RX Contrast  Result Date: 02/16/2018 CLINICAL DATA:  67 y/o F; peripheral vascular disease and stroke affecting the left side. Recent progressive intermittent left-sided weakness. Episode of seizure-like activity. EXAM: MRI HEAD WITHOUT CONTRAST MRA HEAD WITHOUT CONTRAST TECHNIQUE: Multiplanar, multiecho pulse sequences of the brain and surrounding structures were obtained without intravenous contrast. Angiographic images of the head were obtained using MRA technique without contrast. COMPARISON:  02/15/2018 CT head. 01/28/2011 MRI and MRA of the head. FINDINGS: MRI HEAD FINDINGS Brain: 9 mm focus of reduced diffusion in the right mid corona radiata with few additional punctate adjacent foci  compatible with acute/early subacute infarction. No associated hemorrhage or mass effect. Fewnonspecific foci of T2 FLAIR hyperintense signal abnormality in subcortical and periventricular white matter are compatible withmildchronic microvascular ischemic changes for age. Mildbrain parenchymal volume loss. Small chronic infarctions are present within the right genu of corpus callosum, right body of corpus callosum, and right hemi pons. No extra-axial collection, hydrocephalus, or effacement of basilar cisterns. Vascular: As below. Skull and upper cervical spine: Normal marrow signal. Sinuses/Orbits: Negative. Other: None. MRA HEAD FINDINGS Severe motion artifact. Suboptimal assessment for stenosis or small aneurysm. No large vessel occlusion or large aneurysm identified. IMPRESSION: MRI head: 1. Subcentimeter acute/early subacute infarction and right mid corona radiata with few adjacent punctate foci. No associated hemorrhage or mass effect. 2. Mild chronic microvascular ischemic changes and mild parenchymal volume loss of the brain. 3. Small chronic infarcts in right genu of corpus callosum, right body of corpus callosum, and right hemi pons. MRA head: Severe motion artifact. Suboptimal assessment for stenosis or small aneurysm. No large vessel occlusion or large aneurysm identified. These results will be called to the ordering clinician or representative by the Radiologist Assistant, and communication documented in the PACS or zVision Dashboard. Electronically Signed   By: Kristine Garbe M.D.   On: 02/16/2018 03:50    12-lead ECG SR (personally reviewed) All prior EKG's in EPIC reviewed with no documented atrial fibrillation  Assessment and Plan:  1. Cryptogenic stroke The patient presents with cryptogenic stroke.  TEE was unremarkable.   I spoke at length with the patient about monitoring for afib with an implantable loop recorder.  Risks, benefits, and alteratives to implantable loop recorder  were discussed with the patient today.   At this time, the patient is very clear in their decision to proceed with implantable loop recorder.   Wound care was reviewed with the patient (keep incision clean and dry for 3 days).  Wound check scheduled and entered in AVS.  Please call with questions.   Chanetta Marshall, NP 03/05/2018 11:30 AM  EP Attending  Patient seen and examined. Agree with above. The patient has experienced a cryptogenic stroke and I have discussed the indications/risks/benefits/goals/expectations and she wishes to proceed.   Mikle Bosworth.D.

## 2018-03-05 NOTE — Discharge Instructions (Signed)
Inpatient Rehab Discharge Instructions  [name] Discharge date and time:  03/05/18  Activities/Precautions/ Functional Status: Activity: no lifting, driving, or strenuous exercise for till cleared by MD Diet: cardiac diet and diabetic diet Wound Care: keep wound clean and dry   Functional status:  ___ No restrictions     ___ Walk up steps independently ___ 24/7 supervision/assistance   ___ Walk up steps with assistance _X__ Intermittent supervision/assistance  _X__ Bathe/dress independently ___ Walk with walker     ___ Bathe/dress with assistance ___ Walk Independently    ___ Shower independently ___ Walk with assistance    ___ Shower with assistance _X__ No alcohol     ___ Return to work/school ________    COMMUNITY REFERRALS UPON DISCHARGE:    Home Health:   PT     OT     ST                     Agency:  [facility] Phone: [phone]   Medical Equipment/Items Ordered:  Tub bench                                                       Agency/Supplier:  [facility] [phone]  Transportation assistance:  Call DSS @ [phone] and let them know you need to sign up for Medicaid Transportation services  Special Instructions: 1. Family needs to help with medications and make sure that medications are taken as prescribed.   STROKE/TIA DISCHARGE INSTRUCTIONS SMOKING Cigarette smoking nearly doubles your risk of having a stroke & is the single most alterable risk factor  If you smoke or have smoked in the last 12 months, you are advised to quit smoking for your health.  Most of the excess cardiovascular risk related to smoking disappears within a year of stopping.  Ask you doctor about anti-smoking medications  [facility] Quit Line: 1-800-QUIT NOW  Free Smoking Cessation Classes [phone]  CHOLESTEROL Know your levels; limit fat & cholesterol in your diet  Lipid Panel     Component Value Date/Time   CHOL 168 02/16/2018 0200   CHOL 200 (H) 10/18/2016 1518   TRIG 255  (H) 02/16/2018 0200   HDL 30 (L) 02/16/2018 0200   HDL 34 (L) 10/18/2016 1518   CHOLHDL 5.6 02/16/2018 0200   VLDL 51 (H) 02/16/2018 0200   LDLCALC 87 02/16/2018 0200   Big Pool 99 10/18/2016 1518      Many patients benefit from treatment even if their cholesterol is at goal.  Goal: Total Cholesterol (CHOL) less than 160  Goal:  Triglycerides (TRIG) less than 150  Goal:  HDL greater than 40  Goal:  LDL (LDLCALC) less than 100   BLOOD PRESSURE American Stroke Association blood pressure target is less that 120/80 mm/Hg  Your discharge blood pressure is:  BP: 135/69  Monitor your blood pressure  Limit your salt and alcohol intake  Many individuals will require more than one medication for high blood pressure  DIABETES (A1c is a blood sugar average for last 3 months) Goal HGBA1c is under 7% (HBGA1c is blood sugar average for last 3 months)  Diabetes:     Lab Results  Component Value Date   HGBA1C 8.9 (H) 02/16/2018     Your HGBA1c can be lowered with medications, healthy diet, and exercise.  Check your blood sugar as directed by your physician  Call your physician if you experience unexplained or low blood sugars.  PHYSICAL ACTIVITY/REHABILITATION Goal is 30 minutes at least 4 days per week  Activity: Increase activity slowly, and No driving, Therapies: see above Return to work: N/A  Activity decreases your risk of heart attack and stroke and makes your heart stronger.  It helps control your weight and blood pressure; helps you relax and can improve your mood.  Participate in a regular exercise program.  Talk with your doctor about the best form of exercise for you (dancing, walking, swimming, cycling).  DIET/WEIGHT Goal is to maintain a healthy weight  Your discharge diet is: Diet Carb Modified Fluid consistency: Thin; Room service appropriate? Yes  liquids Your height is:    Your current weight is:   Your Body Mass Index (BMI) is:  30  Following the type of diet  specifically designed for you will help prevent another stroke.  Your goal weight range is:  136 lbs  Your goal Body Mass Index (BMI) is 19-24.  Healthy food habits can help reduce 3 risk factors for stroke:  High cholesterol, hypertension, and excess weight.  RESOURCES Stroke/Support Group:  Call [phone]   STROKE EDUCATION PROVIDED/REVIEWED AND GIVEN TO PATIENT Stroke warning signs and symptoms How to activate emergency medical system (call 911). Medications prescribed at discharge. Need for follow-up after discharge. Personal risk factors for stroke. Pneumonia vaccine given:  Flu vaccine given:  My questions have been answered, the writing is legible, and I understand these instructions.  I will adhere to these goals & educational materials that have been provided to me after my discharge from the hospital.     My questions have been answered and I understand these instructions. I will adhere to these goals and the provided educational materials after my discharge from the hospital.  Patient/Caregiver Signature _______________________________ Date __________  Clinician Signature _______________________________________ Date __________  Please bring this form and your medication list with you to all your follow-up doctor's appointments.

## 2018-03-05 NOTE — Progress Notes (Signed)
[facility]ILITATION     PROGRESS NOTE  Subjective/Complaints:  In good spirits. Excited for discharge. No complaints today  ROS: Patient denies fever, rash, sore throat, blurred vision, nausea, vomiting, diarrhea, cough, shortness of breath or chest pain, joint or back pain, headache, or mood change.    Objective: Vital Signs: Blood pressure 135/69, pulse 71, temperature 98 F (36.7 C), temperature source Oral, resp. rate 15, SpO2 99 %. No results found. Recent Labs    03/04/18 0629  WBC 5.4  HGB 9.7*  HCT 29.0*  PLT 326   Recent Labs    03/04/18 0629  NA 136  K 3.8  CL 106  GLUCOSE 168*  BUN 28*  CREATININE 1.37*  CALCIUM 9.1   CBG (last 3)  Recent Labs    03/04/18 1651 03/04/18 2133 03/05/18 0653  GLUCAP 178* 136* 191*    Wt Readings from Last 3 Encounters:  02/16/18 76 kg (167 lb 8.8 oz)  02/15/18 74.6 kg (164 lb 6.4 oz)  02/06/18 76.2 kg (168 lb 1.6 oz)    Physical Exam:  BP 135/69 (BP Location: Right Arm)   Pulse 71   Temp 98 F (36.7 C) (Oral)   Resp 15   SpO2 99%  Constitutional: No distress . Vital signs reviewed. HEENT: EOMI, oral membranes moist Cardiovascular: RRR without murmur. No JVD    Respiratory: CTA Bilaterally without wheezes or rales. Normal effort    GI: BS +, non-tender, non-distended  Musculoskeletal: She exhibits no edema or tenderness.  Neurological: alert and oriented Speech clear Motor: LUE  4-/5 LLE:  4-/5 proximal to distal   RUE/RLE: 4-5/5 proximal to distal-  Skin: Skin is warm and dry. She is not diaphoretic.  Psychiatric: Pleasant and cooperative  Assessment/Plan: 1. 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 Physiatrist is providing close team supervision and 24 hour management of active medical problems listed  below. Physiatrist and rehab team continue to assess barriers to discharge/monitor patient progress toward functional and medical goals.  Function:  Bathing Bathing position   Position: Shower  Bathing parts Body parts bathed by patient: Right arm, Left arm, Chest, Abdomen, Front perineal area, Buttocks, Right upper leg, Left upper leg, Right lower leg, Left lower leg Body parts bathed by helper: Back  Bathing assist Assist Level: More than reasonable time      Upper Body Dressing/Undressing Upper body dressing   What is the patient wearing?: Pull over shirt/dress     Pull over shirt/dress - Perfomed by patient: Thread/unthread right sleeve, Thread/unthread left sleeve, Put head through opening, Pull shirt over trunk          Upper body assist Assist Level: More than reasonable time      Lower Body Dressing/Undressing Lower body dressing   What is the patient wearing?: Non-skid slipper socks, Shoes, Underwear, Pants Underwear - Performed by patient: Thread/unthread right underwear leg, Thread/unthread left underwear leg, Pull underwear up/down   Pants- Performed by patient: Thread/unthread right pants leg, Thread/unthread left pants leg, Pull pants up/down   Non-skid slipper socks- Performed by patient: Don/doff right sock, Don/doff left sock       Shoes - Performed by patient: Don/doff right shoe, Don/doff left shoe, Fasten right, Fasten left            Lower body assist Assist for lower body dressing:  More than reasonable time      Toileting Toileting Toileting activity did not occur: No continent bowel/bladder event Toileting steps completed by patient: Adjust clothing prior to toileting, Performs perineal hygiene, Adjust clothing after toileting Toileting steps completed by helper: Adjust clothing after toileting Toileting Assistive Devices: Grab bar or rail  Toileting assist Assist level: No help/no cues   Transfers Chair/bed transfer Chair/bed transfer  activity did not occur: N/A Chair/bed transfer method: Ambulatory Chair/bed transfer assist level: No help, no cues Chair/bed transfer assistive device: Medical sales representative     Max distance: 200' Assist level: No Help, No cues   Wheelchair Wheelchair activity did not occur: N/A Type: Manual Max wheelchair distance: 166ft  Assist Level: No help, No cues, assistive device, takes more than reasonable amount of time  Cognition Comprehension Comprehension assist level: Follows complex conversation/direction with no assist  Expression Expression assist level: Expresses complex ideas: With no assist  Social Interaction Social Interaction assist level: Interacts appropriately with others - No medications needed.  Problem Solving Problem solving assist level: Solves complex problems: Recognizes & self-corrects  Memory Memory assist level: Complete Independence: No helper    Medical Problem List and Plan: 1. Unsteady gait, poor safety with impulsivity secondary to acute/early subacute infarct in right mid corona radiata with few adjacent punctate foci.   -increased cluster of infarcts in right CR/frontal WM on MRI last weekend, continue CIR PT OT speech    -TEEwithout significant findings   -Plavix and aspirin for stroke prophylaxis   -dc home today   -Patient to see Rehab MD/provider in the office for transitional care encounter in 1-2 weeks. . 2.  DVT Prophylaxis/Anticoagulation: Pharmaceutical: Lovenox 3. Pain Management: tylenol prn  4. Mood: LCSW to follow for evaluation and support.  5. Neuropsych: This patient is not fully capable of making decisions on her own behalf. 6. Skin/Wound Care: routine pressure relief. 7. Fluids/Electrolytes/Nutrition: .    -bun/cr improved to 28/1.37 on 4/22   -continue to push po fluids post discharge.   8. T2DM:   CBG (last 3)  Recent Labs    03/04/18 1651 03/04/18 2133 03/05/18 0653  GLUCAP 178* 136* 191*     -Blood sugars  fair. Needs ongoing mgt once home   -maintain 70/30 insulin at 32u bid with SSI covg 9. Muscle spasms:    No active spasms at present 10 HTN:   (Was on HCTZ and Cozaar daily.)    Vitals:   03/04/18 1357 03/05/18 0445  BP: 120/62 135/69  Pulse: 86 71  Resp:  15  Temp: 98.6 F (37 C) 98 F (36.7 C)  SpO2: 98% 99%  Blood pressure in a good range, on Cozaar, 03/05/2018----dcHCTZ  11. Hypothyroid: On supplement  12. Headaches: improved 13. Acute blood loss anemia  Hemoglobin 9.7 4/22  Cont to monitor 14. CKD   Creatinine 1.37 4/22  Encourage fluids  Continue to monitor as outpt   LOS (Days) 11 A FACE TO FACE EVALUATION WAS PERFORMED  Meredith Staggers 03/05/2018 9:01 AM

## 2018-03-06 ENCOUNTER — Telehealth: Payer: Self-pay

## 2018-03-06 DIAGNOSIS — E1151 Type 2 diabetes mellitus with diabetic peripheral angiopathy without gangrene: Secondary | ICD-10-CM | POA: Diagnosis not present

## 2018-03-06 DIAGNOSIS — R569 Unspecified convulsions: Secondary | ICD-10-CM | POA: Diagnosis not present

## 2018-03-06 DIAGNOSIS — D649 Anemia, unspecified: Secondary | ICD-10-CM | POA: Diagnosis not present

## 2018-03-06 DIAGNOSIS — I69354 Hemiplegia and hemiparesis following cerebral infarction affecting left non-dominant side: Secondary | ICD-10-CM | POA: Diagnosis not present

## 2018-03-06 DIAGNOSIS — I10 Essential (primary) hypertension: Secondary | ICD-10-CM | POA: Diagnosis not present

## 2018-03-06 DIAGNOSIS — E785 Hyperlipidemia, unspecified: Secondary | ICD-10-CM | POA: Diagnosis not present

## 2018-03-06 DIAGNOSIS — E039 Hypothyroidism, unspecified: Secondary | ICD-10-CM | POA: Diagnosis not present

## 2018-03-06 DIAGNOSIS — K219 Gastro-esophageal reflux disease without esophagitis: Secondary | ICD-10-CM | POA: Diagnosis not present

## 2018-03-06 DIAGNOSIS — Z7982 Long term (current) use of aspirin: Secondary | ICD-10-CM | POA: Diagnosis not present

## 2018-03-06 NOTE — Telephone Encounter (Signed)
Khristin with [facility] requesting VO for PT. Requesting the nurse to call back.

## 2018-03-06 NOTE — Telephone Encounter (Signed)
Per Epic, pt was discharged to in-patient rehab x 11 days. Discharged home yesterday.

## 2018-03-06 NOTE — Telephone Encounter (Signed)
Called pt, she picked up after numerous rigs and stated HHN is with her at this time, will call back later

## 2018-03-07 ENCOUNTER — Telehealth: Payer: Self-pay | Admitting: Registered Nurse

## 2018-03-07 DIAGNOSIS — E785 Hyperlipidemia, unspecified: Secondary | ICD-10-CM | POA: Diagnosis not present

## 2018-03-07 DIAGNOSIS — I10 Essential (primary) hypertension: Secondary | ICD-10-CM | POA: Diagnosis not present

## 2018-03-07 DIAGNOSIS — Z7982 Long term (current) use of aspirin: Secondary | ICD-10-CM | POA: Diagnosis not present

## 2018-03-07 DIAGNOSIS — K219 Gastro-esophageal reflux disease without esophagitis: Secondary | ICD-10-CM | POA: Diagnosis not present

## 2018-03-07 DIAGNOSIS — D649 Anemia, unspecified: Secondary | ICD-10-CM | POA: Diagnosis not present

## 2018-03-07 DIAGNOSIS — E1151 Type 2 diabetes mellitus with diabetic peripheral angiopathy without gangrene: Secondary | ICD-10-CM | POA: Diagnosis not present

## 2018-03-07 DIAGNOSIS — R569 Unspecified convulsions: Secondary | ICD-10-CM | POA: Diagnosis not present

## 2018-03-07 DIAGNOSIS — E039 Hypothyroidism, unspecified: Secondary | ICD-10-CM | POA: Diagnosis not present

## 2018-03-07 DIAGNOSIS — I69354 Hemiplegia and hemiparesis following cerebral infarction affecting left non-dominant side: Secondary | ICD-10-CM | POA: Diagnosis not present

## 2018-03-07 NOTE — Telephone Encounter (Signed)
Transitional Care call  Patient name: [name]  DOB: 12-19-1950 1. Are you/is patient experiencing any problems since coming home? No a. Are there any questions regarding any aspect of care? No 2. Are there any questions regarding medications administration/dosing?No a. Are meds being taken as prescribed? Yes b. "Patient should review meds with caller to confirm" Medication List Reviewed 3. Have there been any falls? No 4. Has Home Health been to the house and/or have they contacted you? Yes, [facility] a. If not, have you tried to contact them? NA b. Can we help you contact them? NA 5. Are bowels and bladder emptying properly? Yes a. Are there any unexpected incontinence issues? No b. If applicable, is patient following bowel/bladder programs? NA 6. Any fevers, problems with breathing, unexpected pain? No 7. Are there any skin problems or new areas of breakdown? No 8. Has the patient/family member arranged specialty MD follow up (ie cardiology/neurology/renal/surgical/etc.)?  Appointments are scheduled she reports. a. Can we help arrange? NA 9. Does the patient need any other services or support that we can help arrange? NA 10. Are caregivers following through as expected in assisting the patient? Yes 11. Has the patient quit smoking, drinking alcohol, or using drugs as recommended? Ms. Mendia reports she has quit smoking, she denies drinking alcohol or using illicit drugs.  Appointment date/time 03/12/2018 arrival time 11:20 for 11:40 appointment  with Dr. Naaman Plummer at [phone]

## 2018-03-07 NOTE — Telephone Encounter (Signed)
Called Khristin PT, [facility] - requesting verbal orders for "PT once a week x 1 week; 2 times a week x 1 week then once a week x 2 weeks". VO given ; if not appropriate, let me know.

## 2018-03-07 NOTE — Telephone Encounter (Signed)
I agree, thank you!  [name] 

## 2018-03-08 ENCOUNTER — Telehealth: Payer: Self-pay | Admitting: *Deleted

## 2018-03-08 NOTE — Telephone Encounter (Signed)
[name], PT, [facility] left a message asking for verbal orders for HHPT 1week1 followed by 2week1 followed by 1week2.  Contacted PT and gave verbal orders per office protocol

## 2018-03-08 NOTE — Telephone Encounter (Signed)
Alonna Minium, ST, [place] left a message asking for verbal orders to extend [place] one more visit.  Verbal orders given per office protocol

## 2018-03-12 ENCOUNTER — Encounter: Payer: Medicare HMO | Attending: Physical Medicine & Rehabilitation | Admitting: Physical Medicine & Rehabilitation

## 2018-03-12 ENCOUNTER — Encounter: Payer: Self-pay | Admitting: Physical Medicine & Rehabilitation

## 2018-03-12 VITALS — BP 167/71 | HR 68 | Ht 62.0 in | Wt 171.0 lb

## 2018-03-12 DIAGNOSIS — I129 Hypertensive chronic kidney disease with stage 1 through stage 4 chronic kidney disease, or unspecified chronic kidney disease: Secondary | ICD-10-CM | POA: Insufficient documentation

## 2018-03-12 DIAGNOSIS — I63411 Cerebral infarction due to embolism of right middle cerebral artery: Secondary | ICD-10-CM

## 2018-03-12 DIAGNOSIS — F482 Pseudobulbar affect: Secondary | ICD-10-CM

## 2018-03-12 DIAGNOSIS — K5900 Constipation, unspecified: Secondary | ICD-10-CM | POA: Insufficient documentation

## 2018-03-12 DIAGNOSIS — K219 Gastro-esophageal reflux disease without esophagitis: Secondary | ICD-10-CM | POA: Diagnosis not present

## 2018-03-12 DIAGNOSIS — Z9851 Tubal ligation status: Secondary | ICD-10-CM | POA: Diagnosis not present

## 2018-03-12 DIAGNOSIS — Z87891 Personal history of nicotine dependence: Secondary | ICD-10-CM | POA: Insufficient documentation

## 2018-03-12 DIAGNOSIS — E1122 Type 2 diabetes mellitus with diabetic chronic kidney disease: Secondary | ICD-10-CM | POA: Diagnosis not present

## 2018-03-12 DIAGNOSIS — G8194 Hemiplegia, unspecified affecting left nondominant side: Secondary | ICD-10-CM

## 2018-03-12 DIAGNOSIS — R569 Unspecified convulsions: Secondary | ICD-10-CM | POA: Diagnosis not present

## 2018-03-12 DIAGNOSIS — I2510 Atherosclerotic heart disease of native coronary artery without angina pectoris: Secondary | ICD-10-CM | POA: Diagnosis not present

## 2018-03-12 DIAGNOSIS — I10 Essential (primary) hypertension: Secondary | ICD-10-CM | POA: Diagnosis not present

## 2018-03-12 DIAGNOSIS — E1165 Type 2 diabetes mellitus with hyperglycemia: Secondary | ICD-10-CM

## 2018-03-12 DIAGNOSIS — R2681 Unsteadiness on feet: Secondary | ICD-10-CM | POA: Diagnosis not present

## 2018-03-12 DIAGNOSIS — E1151 Type 2 diabetes mellitus with diabetic peripheral angiopathy without gangrene: Secondary | ICD-10-CM | POA: Diagnosis not present

## 2018-03-12 DIAGNOSIS — E039 Hypothyroidism, unspecified: Secondary | ICD-10-CM | POA: Diagnosis not present

## 2018-03-12 DIAGNOSIS — I69354 Hemiplegia and hemiparesis following cerebral infarction affecting left non-dominant side: Secondary | ICD-10-CM | POA: Diagnosis not present

## 2018-03-12 DIAGNOSIS — D649 Anemia, unspecified: Secondary | ICD-10-CM | POA: Diagnosis not present

## 2018-03-12 DIAGNOSIS — N189 Chronic kidney disease, unspecified: Secondary | ICD-10-CM | POA: Insufficient documentation

## 2018-03-12 DIAGNOSIS — Z9889 Other specified postprocedural states: Secondary | ICD-10-CM | POA: Insufficient documentation

## 2018-03-12 DIAGNOSIS — E785 Hyperlipidemia, unspecified: Secondary | ICD-10-CM | POA: Insufficient documentation

## 2018-03-12 DIAGNOSIS — Z7982 Long term (current) use of aspirin: Secondary | ICD-10-CM | POA: Diagnosis not present

## 2018-03-12 NOTE — Progress Notes (Signed)
Subjective:    Patient ID: [name], female    DOB: [birth year], 67 y.o.   MRN: [id number]  HPI   [name] is here in follow up of her right corona radiata region CVA. She has been home since last week and reports that she's doing well. HH therapies has not come out to her house yet. She denies any falls or mishaps. She's not using an adaptive device to walk. Sleep is good. Bladder is functioning normal. No spasms.  Sugars have been low after breakfast. Otherwise they've been reasonable throughout the remainder of the day. She is taking 70/30 insulin 30u bid.. She's eating consistently. She reports that she's a little constipated and asks if she can take prune juice. [name] reports her fluid intake is good.  She does feel that she is more emotional than usual. She mentioned that even watching a commercial can make her cry at times. She denies anxiety or depression.   She wants to get back to driving. She took the bus over here today.   Pain Inventory Average Pain 0 Pain Right Now 0 My pain is na  In the last 24 hours, has pain interfered with the following? General activity 0 Relation with others 0 Enjoyment of life 0 What TIME of day is your pain at its worst? na Sleep (in general) Good  Pain is worse with: na Pain improves with: na Relief from Meds: na  Mobility walk without assistance ability to climb steps?  yes do you drive?  no  Function disabled: date disabled . retired  Neuro/Psych No problems in this area  Prior Studies Any changes since last visit?  no  Physicians involved in your care Any changes since last visit?  no   Family History  Problem Relation Age of Onset  . Diabetes Brother   . Prostate cancer Brother   . Diabetes Mother   . Diabetes Father   . Diabetes Sister   . Esophageal cancer Neg Hx   . Rectal cancer Neg Hx   . Stomach cancer Neg Hx   . Colon cancer Neg Hx   . Breast cancer Neg Hx    Social History   Socioeconomic  History  . Marital status: Single    Spouse name: Not on file  . Number of children: 1  . Years of education: 71th- [place]  . Highest education level: Not on file  Occupational History  . Occupation: Retired  Scientific laboratory technician  . Financial resource strain: Not on file  . Food insecurity:    Worry: Not on file    Inability: Not on file  . Transportation needs:    Medical: Not on file    Non-medical: Not on file  Tobacco Use  . Smoking status: Former Smoker    Last attempt to quit: 02/04/2011    Years since quitting: 7.1  . Smokeless tobacco: Never Used  Substance and Sexual Activity  . Alcohol use: No    Alcohol/week: 0.0 oz  . Drug use: No  . Sexual activity: Not on file  Lifestyle  . Physical activity:    Days per week: Not on file    Minutes per session: Not on file  . Stress: Not on file  Relationships  . Social connections:    Talks on phone: Not on file    Gets together: Not on file    Attends religious service: Not on file    Active member of club or organization: Not on file  Attends meetings of clubs or organizations: Not on file    Relationship status: Not on file  Other Topics Concern  . Not on file  Social History Narrative  . Not on file   Past Surgical History:  Procedure Laterality Date  . CESAREAN SECTION    . LEFT HEART CATHETERIZATION WITH CORONARY ANGIOGRAM N/A 03/08/2012   Procedure: LEFT HEART CATHETERIZATION WITH CORONARY ANGIOGRAM;  Surgeon: Sinclair Grooms, MD;  Location: [facility] CATH LAB;  Service: Cardiovascular;  Laterality: N/A;  . LEFT HEART CATHETERIZATION WITH CORONARY ANGIOGRAM N/A 12/17/2012   Procedure: LEFT HEART CATHETERIZATION WITH CORONARY ANGIOGRAM;  Surgeon: Laverda Page, MD;  Location: [facility] CATH LAB;  Service: Cardiovascular;  Laterality: N/A;  . LOOP RECORDER INSERTION N/A 03/05/2018   Procedure: LOOP RECORDER INSERTION;  Surgeon: Evans Lance, MD;  Location: [facility] CV LAB;  Service: Cardiovascular;  Laterality: N/A;  . LOWER  EXTREMITY ANGIOGRAM N/A 12/17/2012   Procedure: LOWER EXTREMITY ANGIOGRAM;  Surgeon: Laverda Page, MD;  Location: [facility] CATH LAB;  Service: Cardiovascular;  Laterality: N/A;  . TEE WITHOUT CARDIOVERSION N/A 02/28/2018   Procedure: TRANSESOPHAGEAL ECHOCARDIOGRAM (TEE);  Surgeon: Jerline Pain, MD;  Location: [facility] ENDOSCOPY;  Service: Cardiovascular;  Laterality: N/A;  . TUBAL LIGATION     Past Medical History:  Diagnosis Date  . Anemia   . Arteriosclerotic cardiovascular disease   . Claudication in peripheral vascular disease ([place])   . CVA (cerebral vascular accident) ([place]) 01/2011   affected left side  . Diabetes mellitus 2000  . GERD (gastroesophageal reflux disease)   . Hyperlipidemia   . Hypertension   . Hypothyroidism   . Renal insufficiency   . Vitamin D deficiency    BP (!) 167/71   Pulse 68   Ht 5\' 2"  (1.575 m)   Wt 171 lb (77.6 kg)   SpO2 95%   BMI 31.28 kg/m   Opioid Risk Score:   Fall Risk Score:  `1  Depression screen PHQ 2/9  Depression screen [facility] 2/9 02/15/2018 10/24/2017 08/07/2017 06/21/2017 04/25/2017 03/20/2017 08/29/2016  Decreased Interest 0 0 0 0 0 0 0  Down, Depressed, Hopeless 0 0 0 0 0 0 0  PHQ - 2 Score 0 0 0 0 0 0 0  Some recent data might be hidden     Review of Systems  Constitutional: Negative.   HENT: Negative.   Eyes: Negative.   Respiratory: Negative.   Cardiovascular:       Hypertension  Gastrointestinal: Positive for constipation.  Endocrine:       High blood sugar  Genitourinary: Negative.   Musculoskeletal: Negative.   Skin: Negative.   Allergic/Immunologic: Negative.   Neurological: Negative.   Hematological: Negative.   Psychiatric/Behavioral: Negative.   All other systems reviewed and are negative.      Objective:   Physical Exam  General: No acute distress HEENT: EOMI, oral membranes moist Cards: reg rate  Chest: normal effort Abdomen: Soft, NT, ND Skin: dry, intact Extremities: no edema.  Neurological: alert and  oriented Speech clear Motor: LUE  4+/5 LLE:  4+/5 proximal to distal   RUE/RLE: 4-5/5 proximal to distal-  Skin: Skin is warm and dry. She is not diaphoretic.  Psychiatric: Pleasant and cooperative        Assessment & Plan:  1. Unsteady gait, poor safety with impulsivity secondary to acute/early subacute infarct in right mid corona radiata with few adjacent punctate foci.             -  begin [facility] therapies this week. Probably won't need much.  -gave her return to driving instructions today. She will review with daughter who needs to sign off on them with her 2.. T2DM:    -Hypoglycemia after breakfast                     -adjust 70/30 insulin to 25u in AM and 30u in PM  -reviewed importance of diet 3. Pseudobulbar affect: reassured her that these symptoms should improve. They are not all that severe right now. If they continue to be a problem, we can consider a neudexta trial 4.  HTN: --continue cozaar 5.. Hypothyroid: On supplement  6. Constipation: reviewed appropriate dietary steps. Increasing fluids, fruits, fibers. She wants to use prune juice which is fine 7. CKD             per primary   Follow up with me or NP in about 2 months. Thirty minutes of face to face patient care time were spent during this visit. All questions were encouraged and answered.

## 2018-03-12 NOTE — Patient Instructions (Signed)
70/30 INSULIN: 25 UNITS IN MORNING 30 UNITS AT NIGHT   RETURN TO DRIVING PLAN:  WITH THE SUPERVISION OF A LICENSED DRIVER, PLEASE DRIVE IN AN EMPTY PARKING LOT FOR AT LEAST 2-3 TRIALS TO TEST REACTION TIME, VISION, USE OF EQUIPMENT IN CAR, ETC.  IF SUCCESSFUL WITH THE PARKING LOT DRIVING, PROCEED TO SUPERVISED DRIVING TRIALS IN YOUR NEIGHBORHOOD STREETS AT LOW TRAFFIC TIMES TO TEST OBSERVATION TO TRAFFIC SIGNALS, REACTION TIME, ETC. PLEASE ATTEMPT AT LEAST 2-3 TRIALS IN YOUR NEIGHBORHOOD.  IF NEIGHBORHOOD DRIVING IS SUCCESSFUL, YOU MAY PROCEED TO DRIVING IN BUSIER AREAS IN YOUR COMMUNITY WITH SUPERVISION OF A LICENSED DRIVER. PLEASE ATTEMPT AT LEAST 4-5 TRIALS.  IF COMMUNITY DRIVING IS SUCCESSFUL, YOU MAY PROCEED TO DRIVING ALONE, DURING THE DAY TIME, IN NON-PEAK TRAFFIC TIMES. YOU SHOULD DRIVE NO FURTHER THAN 30 MINUTES IN ONE DIRECTION. PLEASE DO NOT DRIVE IF YOU FEEL FATIGUED OR UNDER THE INFLUENCE OF MEDICATION.

## 2018-03-14 DIAGNOSIS — I10 Essential (primary) hypertension: Secondary | ICD-10-CM | POA: Diagnosis not present

## 2018-03-14 DIAGNOSIS — I69354 Hemiplegia and hemiparesis following cerebral infarction affecting left non-dominant side: Secondary | ICD-10-CM | POA: Diagnosis not present

## 2018-03-14 DIAGNOSIS — K219 Gastro-esophageal reflux disease without esophagitis: Secondary | ICD-10-CM | POA: Diagnosis not present

## 2018-03-14 DIAGNOSIS — E1151 Type 2 diabetes mellitus with diabetic peripheral angiopathy without gangrene: Secondary | ICD-10-CM | POA: Diagnosis not present

## 2018-03-14 DIAGNOSIS — R569 Unspecified convulsions: Secondary | ICD-10-CM | POA: Diagnosis not present

## 2018-03-14 DIAGNOSIS — D649 Anemia, unspecified: Secondary | ICD-10-CM | POA: Diagnosis not present

## 2018-03-14 DIAGNOSIS — E785 Hyperlipidemia, unspecified: Secondary | ICD-10-CM | POA: Diagnosis not present

## 2018-03-14 DIAGNOSIS — Z7982 Long term (current) use of aspirin: Secondary | ICD-10-CM | POA: Diagnosis not present

## 2018-03-14 DIAGNOSIS — E039 Hypothyroidism, unspecified: Secondary | ICD-10-CM | POA: Diagnosis not present

## 2018-03-15 ENCOUNTER — Telehealth: Payer: Self-pay

## 2018-03-15 DIAGNOSIS — Z7982 Long term (current) use of aspirin: Secondary | ICD-10-CM | POA: Diagnosis not present

## 2018-03-15 DIAGNOSIS — K219 Gastro-esophageal reflux disease without esophagitis: Secondary | ICD-10-CM | POA: Diagnosis not present

## 2018-03-15 DIAGNOSIS — I69354 Hemiplegia and hemiparesis following cerebral infarction affecting left non-dominant side: Secondary | ICD-10-CM | POA: Diagnosis not present

## 2018-03-15 DIAGNOSIS — D649 Anemia, unspecified: Secondary | ICD-10-CM | POA: Diagnosis not present

## 2018-03-15 DIAGNOSIS — E1151 Type 2 diabetes mellitus with diabetic peripheral angiopathy without gangrene: Secondary | ICD-10-CM | POA: Diagnosis not present

## 2018-03-15 DIAGNOSIS — E039 Hypothyroidism, unspecified: Secondary | ICD-10-CM | POA: Diagnosis not present

## 2018-03-15 DIAGNOSIS — I10 Essential (primary) hypertension: Secondary | ICD-10-CM | POA: Diagnosis not present

## 2018-03-15 DIAGNOSIS — E785 Hyperlipidemia, unspecified: Secondary | ICD-10-CM | POA: Diagnosis not present

## 2018-03-15 DIAGNOSIS — R569 Unspecified convulsions: Secondary | ICD-10-CM | POA: Diagnosis not present

## 2018-03-15 NOTE — Telephone Encounter (Signed)
Cathryn Cunningham OT [facility] called requesting verbal orders for 2-x-wk X 1wk followed by 1-x-wk X 1wk for fine motor skills, self care.  Called her back and approved verbal orders.

## 2018-03-18 ENCOUNTER — Ambulatory Visit (INDEPENDENT_AMBULATORY_CARE_PROVIDER_SITE_OTHER): Payer: Self-pay | Admitting: *Deleted

## 2018-03-18 ENCOUNTER — Ambulatory Visit: Payer: Medicaid Other

## 2018-03-18 DIAGNOSIS — I10 Essential (primary) hypertension: Secondary | ICD-10-CM | POA: Diagnosis not present

## 2018-03-18 DIAGNOSIS — Z7982 Long term (current) use of aspirin: Secondary | ICD-10-CM | POA: Diagnosis not present

## 2018-03-18 DIAGNOSIS — R569 Unspecified convulsions: Secondary | ICD-10-CM | POA: Diagnosis not present

## 2018-03-18 DIAGNOSIS — Z794 Long term (current) use of insulin: Secondary | ICD-10-CM | POA: Diagnosis not present

## 2018-03-18 DIAGNOSIS — E039 Hypothyroidism, unspecified: Secondary | ICD-10-CM | POA: Diagnosis not present

## 2018-03-18 DIAGNOSIS — I69354 Hemiplegia and hemiparesis following cerebral infarction affecting left non-dominant side: Secondary | ICD-10-CM | POA: Diagnosis not present

## 2018-03-18 DIAGNOSIS — E785 Hyperlipidemia, unspecified: Secondary | ICD-10-CM | POA: Diagnosis not present

## 2018-03-18 DIAGNOSIS — Z8673 Personal history of transient ischemic attack (TIA), and cerebral infarction without residual deficits: Secondary | ICD-10-CM | POA: Diagnosis not present

## 2018-03-18 DIAGNOSIS — Z87891 Personal history of nicotine dependence: Secondary | ICD-10-CM

## 2018-03-18 DIAGNOSIS — D649 Anemia, unspecified: Secondary | ICD-10-CM | POA: Diagnosis not present

## 2018-03-18 DIAGNOSIS — E1151 Type 2 diabetes mellitus with diabetic peripheral angiopathy without gangrene: Secondary | ICD-10-CM | POA: Diagnosis not present

## 2018-03-18 DIAGNOSIS — Z7902 Long term (current) use of antithrombotics/antiplatelets: Secondary | ICD-10-CM | POA: Diagnosis not present

## 2018-03-18 DIAGNOSIS — K219 Gastro-esophageal reflux disease without esophagitis: Secondary | ICD-10-CM | POA: Diagnosis not present

## 2018-03-18 DIAGNOSIS — I63311 Cerebral infarction due to thrombosis of right middle cerebral artery: Secondary | ICD-10-CM

## 2018-03-18 NOTE — Progress Notes (Signed)
Wound check appointment. Steri-strips removed by patient at home. Wound without redness or edema. Incision edges approximated, wound well healed. Loop check in clinic. Battery status: good. R-waves 0.78mV. 0 symptom episodes, 0 tachy episodes, 0 pause episodes, 0 brady episodes. 0 AF episodes. Monthly summary reports and ROV with GT PRN

## 2018-03-19 DIAGNOSIS — I10 Essential (primary) hypertension: Secondary | ICD-10-CM | POA: Diagnosis not present

## 2018-03-19 DIAGNOSIS — E1151 Type 2 diabetes mellitus with diabetic peripheral angiopathy without gangrene: Secondary | ICD-10-CM | POA: Diagnosis not present

## 2018-03-19 DIAGNOSIS — R569 Unspecified convulsions: Secondary | ICD-10-CM | POA: Diagnosis not present

## 2018-03-19 DIAGNOSIS — Z7982 Long term (current) use of aspirin: Secondary | ICD-10-CM | POA: Diagnosis not present

## 2018-03-19 DIAGNOSIS — I69354 Hemiplegia and hemiparesis following cerebral infarction affecting left non-dominant side: Secondary | ICD-10-CM | POA: Diagnosis not present

## 2018-03-19 DIAGNOSIS — E039 Hypothyroidism, unspecified: Secondary | ICD-10-CM | POA: Diagnosis not present

## 2018-03-19 DIAGNOSIS — E785 Hyperlipidemia, unspecified: Secondary | ICD-10-CM | POA: Diagnosis not present

## 2018-03-19 DIAGNOSIS — K219 Gastro-esophageal reflux disease without esophagitis: Secondary | ICD-10-CM | POA: Diagnosis not present

## 2018-03-19 DIAGNOSIS — D649 Anemia, unspecified: Secondary | ICD-10-CM | POA: Diagnosis not present

## 2018-03-20 ENCOUNTER — Ambulatory Visit: Payer: Medicaid Other

## 2018-03-21 DIAGNOSIS — K219 Gastro-esophageal reflux disease without esophagitis: Secondary | ICD-10-CM | POA: Diagnosis not present

## 2018-03-21 DIAGNOSIS — I69354 Hemiplegia and hemiparesis following cerebral infarction affecting left non-dominant side: Secondary | ICD-10-CM | POA: Diagnosis not present

## 2018-03-21 DIAGNOSIS — D649 Anemia, unspecified: Secondary | ICD-10-CM | POA: Diagnosis not present

## 2018-03-21 DIAGNOSIS — R569 Unspecified convulsions: Secondary | ICD-10-CM | POA: Diagnosis not present

## 2018-03-21 DIAGNOSIS — Z7982 Long term (current) use of aspirin: Secondary | ICD-10-CM | POA: Diagnosis not present

## 2018-03-21 DIAGNOSIS — E785 Hyperlipidemia, unspecified: Secondary | ICD-10-CM | POA: Diagnosis not present

## 2018-03-21 DIAGNOSIS — I10 Essential (primary) hypertension: Secondary | ICD-10-CM | POA: Diagnosis not present

## 2018-03-21 DIAGNOSIS — E1151 Type 2 diabetes mellitus with diabetic peripheral angiopathy without gangrene: Secondary | ICD-10-CM | POA: Diagnosis not present

## 2018-03-21 DIAGNOSIS — E039 Hypothyroidism, unspecified: Secondary | ICD-10-CM | POA: Diagnosis not present

## 2018-03-22 ENCOUNTER — Ambulatory Visit (INDEPENDENT_AMBULATORY_CARE_PROVIDER_SITE_OTHER): Payer: Medicare HMO | Admitting: Internal Medicine

## 2018-03-22 ENCOUNTER — Other Ambulatory Visit: Payer: Self-pay | Admitting: Internal Medicine

## 2018-03-22 VITALS — BP 136/53 | HR 78 | Temp 98.4°F | Wt 171.4 lb

## 2018-03-22 DIAGNOSIS — Z7982 Long term (current) use of aspirin: Secondary | ICD-10-CM | POA: Diagnosis not present

## 2018-03-22 DIAGNOSIS — I10 Essential (primary) hypertension: Secondary | ICD-10-CM | POA: Diagnosis not present

## 2018-03-22 DIAGNOSIS — I69354 Hemiplegia and hemiparesis following cerebral infarction affecting left non-dominant side: Secondary | ICD-10-CM | POA: Diagnosis not present

## 2018-03-22 DIAGNOSIS — Z79899 Other long term (current) drug therapy: Secondary | ICD-10-CM | POA: Diagnosis not present

## 2018-03-22 DIAGNOSIS — K219 Gastro-esophageal reflux disease without esophagitis: Secondary | ICD-10-CM | POA: Diagnosis not present

## 2018-03-22 DIAGNOSIS — Z8673 Personal history of transient ischemic attack (TIA), and cerebral infarction without residual deficits: Secondary | ICD-10-CM

## 2018-03-22 DIAGNOSIS — E1151 Type 2 diabetes mellitus with diabetic peripheral angiopathy without gangrene: Secondary | ICD-10-CM | POA: Diagnosis not present

## 2018-03-22 DIAGNOSIS — R569 Unspecified convulsions: Secondary | ICD-10-CM | POA: Diagnosis not present

## 2018-03-22 DIAGNOSIS — E785 Hyperlipidemia, unspecified: Secondary | ICD-10-CM | POA: Diagnosis not present

## 2018-03-22 DIAGNOSIS — E039 Hypothyroidism, unspecified: Secondary | ICD-10-CM | POA: Diagnosis not present

## 2018-03-22 DIAGNOSIS — E1165 Type 2 diabetes mellitus with hyperglycemia: Secondary | ICD-10-CM

## 2018-03-22 DIAGNOSIS — D649 Anemia, unspecified: Secondary | ICD-10-CM | POA: Diagnosis not present

## 2018-03-22 NOTE — Progress Notes (Signed)
   CC: Follow up for stroke hospitalization  HPI:  [name] is a 67 y.o. female with PMHx detailed below presenting for follow-up after hospitalization with stroke last month.  She has since completed skilled nursing rehab and returned home.  Her main deficits were some loss of left-sided strength and coordination but this is substantially improved after her rehab.  See problem based assessment and plan below for additional details.  History of CVA (cerebrovascular accident) [name] is excellent recovery after physical therapy with minimal neurologic deficit on exam.  She is back on dual antiplatelet therapy due to recurrence of stroke, and had an implantable loop recorder placed to assess for occult atrial fibrillation. She is eager to restart driving and wants recommendations on this today. Plan: Continue dual antiplatelet therapy Recommend follow-up with neurology and review of loop recorder Reassess diabetes and blood pressure control progress at next PCP follow-up  Recommended starting to striving in occupied roads with a supervising license driver (probably her daughter) If this goes well then could proceed to supervised driving then independent I do not think she needs a formal evaluation given the excellent neurologic recovery on my exam.   Past Medical History:  Diagnosis Date  . Anemia   . Arteriosclerotic cardiovascular disease   . Claudication in peripheral vascular disease ([place])   . CVA (cerebral vascular accident) ([place]) 01/2011   affected left side  . Diabetes mellitus 2000  . GERD (gastroesophageal reflux disease)   . Hyperlipidemia   . Hypertension   . Hypothyroidism   . Renal insufficiency   . Vitamin D deficiency     Review of Systems: Review of Systems  Constitutional: Negative for malaise/fatigue.  Eyes: Negative for blurred vision and double vision.  Cardiovascular: Negative for leg swelling.  Gastrointestinal: Negative for abdominal pain.   Neurological: Negative for sensory change.     Physical Exam: Vitals:   03/22/18 0910  BP: (!) 136/53  Pulse: 78  Temp: 98.4 F (36.9 C)  TempSrc: Oral  SpO2: 100%  Weight: 171 lb 6.4 oz (77.7 kg)   GENERAL- alert, co-operative, NAD HEENT- Atraumatic, PERRL, no visual field deficit, extraocular movements intact, no horizontal nystagmus, neck is supple with full range of motion CARDIAC- RRR, no murmurs, rubs or gallops. RESP- CTAB, no wheezes or crackles. NEURO- strength is 5 out of 5 in bilateral upper and lower extremities, sensation is grossly intact throughout, very slight finger-to-nose dysmetria in the left hand, normal gait and normal Romberg test EXTREMITIES- no pedal edema.   Assessment & Plan:   See encounters tab for problem based medical decision making.   Patient discussed with Dr. Dareen Piano

## 2018-03-22 NOTE — Patient Instructions (Signed)
I recommend trying to increase your walking or adding some leg strengthening exercises such as leg raises or squats. This may also help get your blood pressure to our goal.  You can start driving with supervision by a licensed driver. If this goes well for a few sessions you can then get back to independent driving.  We can see you again in about 2 months to check on your progress with diabetes and blood pressure.

## 2018-03-25 DIAGNOSIS — E785 Hyperlipidemia, unspecified: Secondary | ICD-10-CM | POA: Diagnosis not present

## 2018-03-25 DIAGNOSIS — Z7982 Long term (current) use of aspirin: Secondary | ICD-10-CM | POA: Diagnosis not present

## 2018-03-25 DIAGNOSIS — I69354 Hemiplegia and hemiparesis following cerebral infarction affecting left non-dominant side: Secondary | ICD-10-CM | POA: Diagnosis not present

## 2018-03-25 DIAGNOSIS — I10 Essential (primary) hypertension: Secondary | ICD-10-CM | POA: Diagnosis not present

## 2018-03-25 DIAGNOSIS — E039 Hypothyroidism, unspecified: Secondary | ICD-10-CM | POA: Diagnosis not present

## 2018-03-25 DIAGNOSIS — D649 Anemia, unspecified: Secondary | ICD-10-CM | POA: Diagnosis not present

## 2018-03-25 DIAGNOSIS — K219 Gastro-esophageal reflux disease without esophagitis: Secondary | ICD-10-CM | POA: Diagnosis not present

## 2018-03-25 DIAGNOSIS — E1151 Type 2 diabetes mellitus with diabetic peripheral angiopathy without gangrene: Secondary | ICD-10-CM | POA: Diagnosis not present

## 2018-03-25 DIAGNOSIS — R569 Unspecified convulsions: Secondary | ICD-10-CM | POA: Diagnosis not present

## 2018-03-25 NOTE — Telephone Encounter (Signed)
Patient seen for HFU 03/22/2018. Hubbard Hartshorn, RN, BSN

## 2018-03-26 ENCOUNTER — Other Ambulatory Visit: Payer: Self-pay | Admitting: Pharmacist

## 2018-03-26 DIAGNOSIS — N183 Chronic kidney disease, stage 3 (moderate): Principal | ICD-10-CM

## 2018-03-26 DIAGNOSIS — E1122 Type 2 diabetes mellitus with diabetic chronic kidney disease: Secondary | ICD-10-CM

## 2018-03-26 MED ORDER — INSULIN DEGLUDEC-LIRAGLUTIDE 100-3.6 UNIT-MG/ML ~~LOC~~ SOPN: 20.0000 [IU] | PEN_INJECTOR | Freq: Every day | SUBCUTANEOUS | 0 refills | Status: DC

## 2018-03-26 MED ORDER — LIRAGLUTIDE 18 MG/3ML ~~LOC~~ SOPN: 1.2000 mg | PEN_INJECTOR | Freq: Every day | SUBCUTANEOUS | 3 refills | Status: DC

## 2018-03-26 NOTE — Progress Notes (Addendum)
Price check Victoza, $3.80 per [facility].  Approved by Dr. Jari Favre and Dr. Naaman Plummer to re-start for blood glucose, weight, CKD, as well as CV benefits.

## 2018-03-27 ENCOUNTER — Encounter: Payer: Self-pay | Admitting: Internal Medicine

## 2018-03-27 ENCOUNTER — Other Ambulatory Visit: Payer: Self-pay

## 2018-03-27 ENCOUNTER — Ambulatory Visit: Payer: Medicare HMO | Admitting: Dietician

## 2018-03-27 ENCOUNTER — Ambulatory Visit (INDEPENDENT_AMBULATORY_CARE_PROVIDER_SITE_OTHER): Payer: Medicare HMO | Admitting: Internal Medicine

## 2018-03-27 VITALS — BP 122/60 | HR 72 | Temp 98.1°F | Ht 62.0 in | Wt 169.3 lb

## 2018-03-27 DIAGNOSIS — Z7902 Long term (current) use of antithrombotics/antiplatelets: Secondary | ICD-10-CM | POA: Diagnosis not present

## 2018-03-27 DIAGNOSIS — I129 Hypertensive chronic kidney disease with stage 1 through stage 4 chronic kidney disease, or unspecified chronic kidney disease: Secondary | ICD-10-CM

## 2018-03-27 DIAGNOSIS — Z7982 Long term (current) use of aspirin: Secondary | ICD-10-CM

## 2018-03-27 DIAGNOSIS — Z794 Long term (current) use of insulin: Secondary | ICD-10-CM

## 2018-03-27 DIAGNOSIS — Z86010 Personal history of colonic polyps: Secondary | ICD-10-CM | POA: Diagnosis not present

## 2018-03-27 DIAGNOSIS — E782 Mixed hyperlipidemia: Secondary | ICD-10-CM

## 2018-03-27 DIAGNOSIS — I63411 Cerebral infarction due to embolism of right middle cerebral artery: Secondary | ICD-10-CM

## 2018-03-27 DIAGNOSIS — N183 Chronic kidney disease, stage 3 unspecified: Secondary | ICD-10-CM

## 2018-03-27 DIAGNOSIS — I69354 Hemiplegia and hemiparesis following cerebral infarction affecting left non-dominant side: Secondary | ICD-10-CM | POA: Diagnosis not present

## 2018-03-27 DIAGNOSIS — I10 Essential (primary) hypertension: Secondary | ICD-10-CM

## 2018-03-27 DIAGNOSIS — D649 Anemia, unspecified: Secondary | ICD-10-CM | POA: Diagnosis not present

## 2018-03-27 DIAGNOSIS — Z79899 Other long term (current) drug therapy: Secondary | ICD-10-CM

## 2018-03-27 DIAGNOSIS — E1122 Type 2 diabetes mellitus with diabetic chronic kidney disease: Secondary | ICD-10-CM

## 2018-03-27 NOTE — Progress Notes (Signed)
   CC: diabetes  HPI:  [name] is a 67 y.o. with a PMH of CVA in 2012, PVD,  T2DM, and HTN presenting to clinic for hospital follow up.   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 She had myoclonic jerks of her LUE which were initially concerning for seizure like activity, however did not respond to multiple anti-epileptic medications with neg EEG. On initial hospitalization neuro recommendations were to continue her plavix and pletal. Patient was transferred to inpatient rehab - during stay she had change in mentation so neuro was re-consulted; MRI/MRA showed new acute/subacute small infarcts from initial MRI; at this time neuro added asa 325mg  daily with plan to stop in 3 months. Bil carotids at 1-39% stenosis per u/s. She had TEE which did not show intracardiac thrombus; loop recorder was placed. Patient was discharged with [facility] therapy.   Patient reports improved symptoms and strength; she denies numbness/tingling, chest pain, shortness of breath, headache, vision or hearing changes. She denies polyuria or polydipsia, nausea, vomiting, diarrhea, or constipation.  Please see problem based Assessment and Plan for status of patients chronic conditions.  Past Medical History:  Diagnosis Date  . Anemia   . Arteriosclerotic cardiovascular disease   . Claudication in peripheral vascular disease ([place])   . CVA (cerebral vascular accident) ([place]) 01/2011   affected left side  . Diabetes mellitus 2000  . GERD (gastroesophageal reflux disease)   . Hyperlipidemia   . Hypertension   . Hypothyroidism   . Renal insufficiency   . Vitamin D deficiency     Review of Systems:   ROS Per HPI  Physical Exam:  Vitals:   03/27/18 1517 03/27/18 1836  BP: (!) 145/52 122/60  Pulse: 72   Temp: 98.1 F (36.7 C)   TempSrc: Oral   SpO2: 100%   Weight: 169 lb 4.8 oz (76.8 kg)   Height: 5'  2" (1.575 m)    GENERAL- alert, co-operative, appears as stated age, not in any distress. HEENT- Atraumatic, normocephalic, PERRL, EOMI, oral mucosa appears moist CARDIAC- RRR, no murmurs, rubs or gallops. RESP- Moving equal volumes of air, and clear to auscultation bilaterally, no wheezes or crackles. ABDOMEN- Soft, nontender, bowel sounds present. NEURO- CN 2-12 intact, RUE/RLE/LLE strength intact, LUE with 4/5 proximal weakness; sensation intact throughout. EXTREMITIES- pulse 1+, symmetric, no pedal edema. Feet with calluses on lateral MTP joints SKIN- Warm, dry. PSYCH- Normal mood and affect, appropriate thought content and speech.  Assessment & Plan:   See Encounters Tab for problem based charting.   Patient discussed with Dr. Abelardo Diesel, MD Internal Medicine PGY2

## 2018-03-27 NOTE — Progress Notes (Signed)
Performed medication history with patient. She is taking all medications as prescribed. Sample of Victoza given to patient.

## 2018-03-27 NOTE — Assessment & Plan Note (Signed)
BP stable. HCTZ d/c'd during hospitalization.  Plan: --continue losartan 100mg  daily

## 2018-03-27 NOTE — Assessment & Plan Note (Addendum)
Patient taking novolin 70/30, 25 units qAM, 30 units qPM. She has made diet changes and has noticed improvement in her CBGs. She denies hyper or hypoglycemic symptoms.  Meter download reviewed - 30d average 134, lowest 75, highest 276 with most readings over last 2 weeks between 80 and 150. This is significantly improved from previous when she was averaging in the 200s consistently.  Plan: --continue novolin 70/30 25 units qAM and 30 units qPM --will hold off on starting victoza today; can consider xultophy at next visit if patient in agreement  --f/u in 2 months

## 2018-03-27 NOTE — Assessment & Plan Note (Addendum)
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 She had myoclonic jerks of her LUE which were initially concerning for seizure like activity, however did not respond to multiple anti-epileptic medications with neg EEG. On initial hospitalization neuro recommendations were to continue her plavix and pletal. Patient was transferred to inpatient rehab - during stay she had change in mentation so neuro was re-consulted; MRI/MRA showed new acute/subacute small infarcts from initial MRI; at this time neuro added asa 325mg  daily with plan to stop in 3 months. Bil carotids at 1-39% stenosis per u/s. She had TEE which did not show intracardiac thrombus; loop recorder was placed. Patient was discharged with [facility] therapy.  Other than mild LUE proximal weakness she is neurologically intact.   Plan: --CBGs better controlled with diet changes; continue to address --HTN well controlled --patient declines statin therapy --on ASA 325mg  daily added onto to her plavix/pletal for 3 months --referral placed for neurology for hospital f/u

## 2018-03-27 NOTE — Assessment & Plan Note (Signed)
Patient with normocytic anemia to 9.7 most recently noted on admission. She denies hematuria, melena, hematochezia. She has a history of colonic polyps up to 1cm in size (TAs) on most recent colonoscopy 03/2017.    Plan: --f/u cbc and iron panel

## 2018-03-27 NOTE — Patient Instructions (Signed)
Continue taking aspirin and plavix; I have placed a referral to neurology so you can see them in the next month or so.  I will plan on seeing you in about 2 months.

## 2018-03-28 DIAGNOSIS — D649 Anemia, unspecified: Secondary | ICD-10-CM | POA: Diagnosis not present

## 2018-03-28 DIAGNOSIS — I10 Essential (primary) hypertension: Secondary | ICD-10-CM | POA: Diagnosis not present

## 2018-03-28 DIAGNOSIS — I69354 Hemiplegia and hemiparesis following cerebral infarction affecting left non-dominant side: Secondary | ICD-10-CM | POA: Diagnosis not present

## 2018-03-28 DIAGNOSIS — R569 Unspecified convulsions: Secondary | ICD-10-CM | POA: Diagnosis not present

## 2018-03-28 DIAGNOSIS — K219 Gastro-esophageal reflux disease without esophagitis: Secondary | ICD-10-CM | POA: Diagnosis not present

## 2018-03-28 DIAGNOSIS — E1151 Type 2 diabetes mellitus with diabetic peripheral angiopathy without gangrene: Secondary | ICD-10-CM | POA: Diagnosis not present

## 2018-03-28 DIAGNOSIS — Z7982 Long term (current) use of aspirin: Secondary | ICD-10-CM | POA: Diagnosis not present

## 2018-03-28 DIAGNOSIS — E039 Hypothyroidism, unspecified: Secondary | ICD-10-CM | POA: Diagnosis not present

## 2018-03-28 DIAGNOSIS — E785 Hyperlipidemia, unspecified: Secondary | ICD-10-CM | POA: Diagnosis not present

## 2018-03-28 LAB — BMP8+ANION GAP
Anion Gap: 20 mmol/L — ABNORMAL HIGH (ref 10.0–18.0)
BUN/Creatinine Ratio: 21 (ref 12–28)
BUN: 32 mg/dL — ABNORMAL HIGH (ref 8–27)
CHLORIDE: 104 mmol/L (ref 96–106)
CO2: 19 mmol/L — ABNORMAL LOW (ref 20–29)
Calcium: 9.6 mg/dL (ref 8.7–10.3)
Creatinine, Ser: 1.55 mg/dL — ABNORMAL HIGH (ref 0.57–1.00)
GFR calc non Af Amer: 35 mL/min/{1.73_m2} — ABNORMAL LOW (ref >59)
GFR, EST AFRICAN AMERICAN: 40 mL/min/{1.73_m2} — AB (ref >59)
GLUCOSE: 113 mg/dL — AB (ref 65–99)
POTASSIUM: 4.3 mmol/L (ref 3.5–5.2)
SODIUM: 143 mmol/L (ref 134–144)

## 2018-03-28 LAB — IRON AND TIBC
Iron Saturation: 19 % (ref 15–55)
Iron: 71 ug/dL (ref 27–139)
TIBC: 373 ug/dL (ref 250–450)
UIBC: 302 ug/dL (ref 118–369)

## 2018-03-28 LAB — CBC
HEMOGLOBIN: 10.9 g/dL — AB (ref 11.1–15.9)
Hematocrit: 33.8 % — ABNORMAL LOW (ref 34.0–46.6)
MCH: 28.7 pg (ref 26.6–33.0)
MCHC: 32.2 g/dL (ref 31.5–35.7)
MCV: 89 fL (ref 79–97)
PLATELETS: 304 10*3/uL (ref 150–379)
RBC: 3.8 x10E6/uL (ref 3.77–5.28)
RDW: 16.2 % — AB (ref 12.3–15.4)
WBC: 4.9 10*3/uL (ref 3.4–10.8)

## 2018-03-28 LAB — FERRITIN: Ferritin: 212 ng/mL — ABNORMAL HIGH (ref 15–150)

## 2018-03-31 ENCOUNTER — Other Ambulatory Visit: Payer: Self-pay | Admitting: Internal Medicine

## 2018-03-31 DIAGNOSIS — N183 Chronic kidney disease, stage 3 unspecified: Secondary | ICD-10-CM

## 2018-03-31 DIAGNOSIS — E1122 Type 2 diabetes mellitus with diabetic chronic kidney disease: Secondary | ICD-10-CM

## 2018-04-01 NOTE — Assessment & Plan Note (Addendum)
[name] is excellent recovery after physical therapy with minimal neurologic deficit on exam.  She is back on dual antiplatelet therapy due to recurrence of stroke, and had an implantable loop recorder placed to assess for occult atrial fibrillation. She is eager to restart driving and wants recommendations on this today. Plan: Continue dual antiplatelet therapy Recommend follow-up with neurology and review of loop recorder Reassess diabetes and blood pressure control progress at next PCP follow-up  Recommended starting to striving in occupied roads with a supervising license driver (probably her daughter) If this goes well then could proceed to supervised driving then independent I do not think she needs a formal evaluation given the excellent neurologic recovery on my exam.

## 2018-04-01 NOTE — Progress Notes (Signed)
Internal Medicine Clinic Attending  Case discussed with Dr. Svalina  at the time of the visit.  We reviewed the resident's history and exam and pertinent patient test results.  I agree with the assessment, diagnosis, and plan of care documented in the resident's note.  

## 2018-04-02 NOTE — Progress Notes (Signed)
Internal Medicine Clinic Attending  Case discussed with Dr. Rice at the time of the visit.  We reviewed the resident's history and exam and pertinent patient test results.  I agree with the assessment, diagnosis, and plan of care documented in the resident's note.  

## 2018-04-04 ENCOUNTER — Ambulatory Visit (INDEPENDENT_AMBULATORY_CARE_PROVIDER_SITE_OTHER): Payer: Medicare HMO | Admitting: *Deleted

## 2018-04-04 DIAGNOSIS — I63311 Cerebral infarction due to thrombosis of right middle cerebral artery: Secondary | ICD-10-CM

## 2018-04-05 NOTE — Progress Notes (Signed)
Carelink Summary Report / Loop Recorder 

## 2018-04-20 ENCOUNTER — Other Ambulatory Visit: Payer: Self-pay | Admitting: Internal Medicine

## 2018-04-22 ENCOUNTER — Other Ambulatory Visit: Payer: Self-pay | Admitting: Internal Medicine

## 2018-04-22 NOTE — Telephone Encounter (Signed)
Patient is requesting a refill plavix 75mg , pls [facility] [address]

## 2018-04-22 NOTE — Telephone Encounter (Signed)
Next appt scheduled  7/10 with PCP. 

## 2018-04-23 ENCOUNTER — Ambulatory Visit (INDEPENDENT_AMBULATORY_CARE_PROVIDER_SITE_OTHER): Payer: Medicare HMO | Admitting: Adult Health

## 2018-04-23 ENCOUNTER — Encounter: Payer: Self-pay | Admitting: Adult Health

## 2018-04-23 VITALS — BP 137/64 | HR 80 | Ht 62.0 in | Wt 169.4 lb

## 2018-04-23 DIAGNOSIS — I10 Essential (primary) hypertension: Secondary | ICD-10-CM | POA: Diagnosis not present

## 2018-04-23 DIAGNOSIS — E782 Mixed hyperlipidemia: Secondary | ICD-10-CM | POA: Diagnosis not present

## 2018-04-23 DIAGNOSIS — I63411 Cerebral infarction due to embolism of right middle cerebral artery: Secondary | ICD-10-CM

## 2018-04-23 NOTE — Progress Notes (Signed)
[facility] [address] [place]. [place] [address] [phone]       OFFICE FOLLOW UP NOTE  [name] Date of Birth:  12-12-1950 Medical Record Number:  [id number]   Reason for Referral:  hospital stroke follow up  CHIEF COMPLAINT:  Chief Complaint  Patient presents with  . Follow-up    Hospital Stroke follow up room in hallway pt is alone    HPI: [name] is being seen today for initial visit in the office for subacute right mid corona radiata infarct and frontal cortical on 02/15/18. History obtained from patient and chart review. Reviewed all radiology images and labs personally.  [name] is a 67 y.o. female with history of peripheral vascular disease, stroke x2affecting the left side with no residual deficits, hypertension, diabetes, hyperlipidemia who presented with left-sided weakness, simple partial seizures, and a new headache. She did not receive IV t-PA due to late presentation.  CT had reviewed and was suggestive of acute infarct in the right periventricular insular region.  MRA head reviewed and showed subcentimeter acute/early subacute infarction right mid corona radiata and a few adjacent punctate foci with multiple old infarcts.  MRA of head showed motion degraded but otherwise unremarkable.  Carotid Doppler showed bilateral 1 to 39% ICA plaquing along with the VA antegrade.  2D echo showed an EF of 60 to 65%.  LDL 87 and as patient is intolerant to statins is recommended to continue Lopid.  A1c 8.9 and recommended tight glycemic control and close PCP follow-up at discharge.  Patient was previously on Plavix and Pletal due to right CC stroke in 2012.  It was recommended to add aspirin 325 for 3 months and then discontinue aspirin and continue on both Plavix and Pletal. patient was having multiple episodes of left eye gaze, left head turning, left arm tonic flexion at home along with more episodes of left arm shaking with hospitalist  during hospitalization.  No loss of consciousness and able to converse well during episodes.  Patient did have EEG long-term for continued possible seizure activity but this was negative and Keppra, Depakote, and Vimpat were stopped.  As EEG was negative for seizure activity, symptoms looked at as left upper extremity spasm and baclofen was started.  Patient was recommended to be discharged to [facility] for continued therapies.   Patient is being seen today for hospital follow-up.  She has been doing well with only mild left-sided weakness but denies upper extremity jerking or headaches.  Patient has completed both PT and OT therapies.  She has returned to all previous activities.  Patient was not discharged on baclofen but denies any jerking activity.  Patient continues aspirin, Pletal, and Plavix without side effects of bleeding or bruising.  Patient continues Lopid for cholesterol management.  Blood pressure today satisfactory 137/64.  States glucose levels have been well in control between 90-100.  Denies new or worsening stroke/TIA symptoms.  ROS:   14 system review of systems performed and negative with exception of weakness  PMH:  Past Medical History:  Diagnosis Date  . Anemia   . Arteriosclerotic cardiovascular disease   . Claudication in peripheral vascular disease ([place])   . CVA (cerebral vascular accident) ([place]) 01/2011   affected left side  . Diabetes mellitus 2000  . GERD (gastroesophageal reflux disease)   . Hyperlipidemia   . Hypertension   . Hypothyroidism   . Renal insufficiency   . Vitamin D deficiency  PSH:  Past Surgical History:  Procedure Laterality Date  . CESAREAN SECTION    . LEFT HEART CATHETERIZATION WITH CORONARY ANGIOGRAM N/A 03/08/2012   Procedure: LEFT HEART CATHETERIZATION WITH CORONARY ANGIOGRAM;  Surgeon: Sinclair Grooms, MD;  Location: [facility] CATH LAB;  Service: Cardiovascular;  Laterality: N/A;  . LEFT HEART CATHETERIZATION WITH CORONARY ANGIOGRAM N/A  12/17/2012   Procedure: LEFT HEART CATHETERIZATION WITH CORONARY ANGIOGRAM;  Surgeon: Laverda Page, MD;  Location: [facility] CATH LAB;  Service: Cardiovascular;  Laterality: N/A;  . LOOP RECORDER INSERTION N/A 03/05/2018   Procedure: LOOP RECORDER INSERTION;  Surgeon: Evans Lance, MD;  Location: [place] CV LAB;  Service: Cardiovascular;  Laterality: N/A;  . LOWER EXTREMITY ANGIOGRAM N/A 12/17/2012   Procedure: LOWER EXTREMITY ANGIOGRAM;  Surgeon: Laverda Page, MD;  Location: [facility] CATH LAB;  Service: Cardiovascular;  Laterality: N/A;  . TEE WITHOUT CARDIOVERSION N/A 02/28/2018   Procedure: TRANSESOPHAGEAL ECHOCARDIOGRAM (TEE);  Surgeon: Jerline Pain, MD;  Location: [facility] ENDOSCOPY;  Service: Cardiovascular;  Laterality: N/A;  . TUBAL LIGATION      Social History:  Social History   Socioeconomic History  . Marital status: Single    Spouse name: Not on file  . Number of children: 1  . Years of education: 6th- [place]  . Highest education level: Not on file  Occupational History  . Occupation: Retired  Scientific laboratory technician  . Financial resource strain: Not on file  . Food insecurity:    Worry: Not on file    Inability: Not on file  . Transportation needs:    Medical: Not on file    Non-medical: Not on file  Tobacco Use  . Smoking status: Former Smoker    Last attempt to quit: 02/04/2011    Years since quitting: 7.2  . Smokeless tobacco: Never Used  Substance and Sexual Activity  . Alcohol use: No    Alcohol/week: 0.0 oz  . Drug use: No  . Sexual activity: Not on file  Lifestyle  . Physical activity:    Days per week: Not on file    Minutes per session: Not on file  . Stress: Not on file  Relationships  . Social connections:    Talks on phone: Not on file    Gets together: Not on file    Attends religious service: Not on file    Active member of club or organization: Not on file    Attends meetings of clubs or organizations: Not on file    Relationship status: Not on file  .  Intimate partner violence:    Fear of current or ex partner: Not on file    Emotionally abused: Not on file    Physically abused: Not on file    Forced sexual activity: Not on file  Other Topics Concern  . Not on file  Social History Narrative  . Not on file    Family History:  Family History  Problem Relation Age of Onset  . Diabetes Brother   . Prostate cancer Brother   . Diabetes Mother   . Diabetes Father   . Diabetes Sister   . Esophageal cancer Neg Hx   . Rectal cancer Neg Hx   . Stomach cancer Neg Hx   . Colon cancer Neg Hx   . Breast cancer Neg Hx     Medications:   Current Outpatient Medications on File Prior to Visit  Medication Sig Dispense Refill  . aspirin 325 MG tablet Take 1  tablet (325 mg total) by mouth daily. 100 tablet 0  . BD INSULIN SYRINGE ULTRAFINE 31G X 15/64" 0.5 ML MISC USE TO INJECT INSULIN TWO TIMES A DAY 100 each 11  . cilostazol (PLETAL) 100 MG tablet Take 1 tablet (100 mg total) by mouth 2 (two) times daily. 60 tablet 0  . clopidogrel (PLAVIX) 75 MG tablet TAKE 1 TABLET BY MOUTH EVERY MORNING 30 tablet 1  . gemfibrozil (LOPID) 600 MG tablet Take 1 tablet (600 mg total) by mouth 2 (two) times daily. 60 tablet 0  . glucose blood (ACCU-CHEK AVIVA PLUS) test strip Check sugars twice a day. The patient is insulin requiring, ICD 10 code E11.9. The patient tests 2 times per day. 100 each 2  . Insulin Isophane & Regular Human (NOVOLIN 70/30 FLEXPEN) (70-30) 100 UNIT/ML PEN Inject 30 Units into the skin daily. 15 mL 8  . Insulin Pen Needle (B-D UF III MINI PEN NEEDLES) 31G X 5 MM MISC The patient is insulin requiring, ICD 10 code E11.9. The patient injects insulin 2 times per day. 100 each 2  . levothyroxine (SYNTHROID, LEVOTHROID) 88 MCG tablet TAKE 1 TABLET BY MOUTH EVERY DAY BEFORE BREAKFAST 90 tablet 3  . liraglutide (VICTOZA) 18 MG/3ML SOPN Inject 0.2 mLs (1.2 mg total) into the skin daily. 6 mL 3  . losartan (COZAAR) 100 MG tablet Take 1 tablet (100  mg total) by mouth daily. 30 tablet 0  . Multiple Vitamins-Minerals (MULTIVITAMIN ADULT PO) Take 1 tablet by mouth once a week. On Wednesday     No current facility-administered medications on file prior to visit.     Allergies:   Allergies  Allergen Reactions  . Dilantin [Phenytoin Sodium Extended]     Rash/itching  . Insulins Rash    Humalog 75/25 only  . Statins Nausea Only and Rash    "sick to my stomach"; describes "rash and a lump" while taking statins     Physical Exam  Vitals:   04/23/18 1426  BP: 137/64  Pulse: 80  Weight: 169 lb 6.4 oz (76.8 kg)  Height: 5\' 2"  (1.575 m)   Body mass index is 30.98 kg/m. No exam data present  General: well developed, pleasant African-American female, well nourished, seated, in no evident distress Head: head normocephalic and atraumatic.   Neck: supple with no carotid or supraclavicular bruits Cardiovascular: regular rate and rhythm, no murmurs Musculoskeletal: no deformity Skin:  no rash/petichiae Vascular:  Normal pulses all extremities  Neurologic Exam Mental Status: Awake and fully alert. Oriented to place and time. Recent and remote memory intact. Attention span, concentration and fund of knowledge appropriate. Mood and affect appropriate.  Cranial Nerves: Fundoscopic exam reveals sharp disc margins. Pupils equal, briskly reactive to light. Extraocular movements full without nystagmus. Visual fields full to confrontation. Hearing intact. Facial sensation intact. Face, tongue, palate moves normally and symmetrically.  Motor: Normal bulk and tone. Normal strength in all tested extremity muscles.  4+/5 LUE and LLE; 5/5 RUE and RLE Sensory.: intact to touch , pinprick , position and vibratory sensation.  Coordination: Rapid alternating movements normal in all extremities. Finger-to-nose and heel-to-shin performed accurately bilaterally. Gait and Station: Arises from chair without difficulty. Stance is normal. Gait demonstrates  normal stride length and balance . Able to heel, toe and tandem walk without difficulty.  Reflexes: 1+ and symmetric. Toes downgoing.    NIHSS  0 Modified Rankin  1   Diagnostic Data (Labs, Imaging, Testing)  Ct Head Wo Contrast 02/16/2018 IMPRESSION:  Suggestion of acute infarct in the right periventricular and insular region. Consider MRI correlation if clinically indicated. No acute intracranial hemorrhage or mass effect.   Mr Brain Wo Contrast 02/16/2018 MRI head:  1. Subcentimeter acute/early subacute infarction and right mid corona radiata with few adjacent punctate foci. No associated hemorrhage or mass effect.  2. Mild chronic microvascular ischemic changes and mild parenchymal volume loss of the brain.  3. Small chronic infarcts in right genu of corpus callosum, right body of corpus callosum, and right hemi pons.  MRA head:  Severe motion artifact. Suboptimal assessment for stenosis or small aneurysm. No large vessel occlusion or large aneurysm identified.   Transthoracic Echocardiogram - Left ventricle: The cavity size was normal. Wall thickness was normal. Systolic function was normal. The estimated ejection fraction was in the range of 60% to 65%. Wall motion was normal; there were no regional wall motion abnormalities. Doppler parameters are consistent with abnormal left ventricular relaxation (grade 1 diastolic dysfunction).  Bilateral Carotid Dopplers  02/16/2018 1-39% ICA plaquing. Vertebral artery flow is antegrade.  EEG This normal EEG is recorded in the waking andsleepstate. There was no seizure or seizure predisposition recorded on this study. Please note that a normal EEG does not preclude the possibility of epilepsy.   LTM EEG day 1 There was no pushbutton activations events during this recording. Automated spike detection program did not detect any spikes. Seizure detection program did not detect any seizures .  Waking background  activities were marked by8cps posterior dominant developed on the as opposed to right. There is a persistent right hemispheric delta slowing involving the right frontotemporal region throughout the recording. There is no interictal epileptiform discharges. No clinical or subclinical seizures present. Clinical interpretation: Thisday 1 of intensive EEG monitoring with simultaneous video monitoring did not record any clinical subclinical seizures. EEG was abnormal due to persistent right frontotemporal delta slowing suggestive of neuronal dysfunction on a structural vascular or degenerative basis. Clinical correlation is advised.  LTM EEG day 2 Day 2:numerous events of interest were recorded marked by difficulty using left arm,some tremulousness particular with intention or action or trying to utilize left arm. Sometimes patients opening and closing his left fist. Sometimes there is a she is trying to move her fingers.Time there is a more complex left arm movements side to side up and down in a synchronous fashion events appeared to be in waxing and waning fashion sometimes lasting a long time however decreased with rest and increased with again trying to utilize left arm. At times patient also complaining of left face been"drawn "" simultaneously with the left arm movements. With patient is completely awake alert and aware during these spells. At times she is eating and utilizing left arm during that time accompanied by numerous pushbutton events as patient experiencing potentially some difficulties in using the left arm. Electrographically was all these numerous events of interest there is unchanged waking background activity is present prior during and after these events of interest. Semiology of these events of interest are inconsistent with seizures. In addition left facial drawing and asymmetry have volitional component possibly. However left arm movements and difficulty associated  with utilizing left arm could be related to other etiologies including underlying left arm weakness itself possibly some movement disorder. Background activities were unchanged from previously recorded in marked by right frontotemporal slowing suggestive of neuronal dysfunction in the right frontotemporal region. Clinical interpretation: This day2of intensive EEG monitoring with simultaneous video monitoring did not record any clinical subclinical  seizures. EEG was abnormal due to right frontotemporal delta slowing suggestive of neuronal dysfunction on a structural vascular or degenerative basis.  Numerous events of interest were recorded involving left arm and left face as discussed above in details. There was no any EEG changes to suggest seizures. However sometimes with simple partial seizures that there is no significant EEG changes. However semiology of this events are inconsistent with simple partial seizures. Again could be related to underlying weakness itself, movement disorder versus some volitional component associated with this events. Clinical correlation is advised.      ASSESSMENT: [name] is a 67 y.o. year old female here with right CR infarct on 02/16/2018 secondary to small vessel disease. Vascular risk factors include PVD, HTN, DM, and HLD.     PLAN: -Continue aspirin 325 mg daily, clopidogrel 75 mg daily and Pletal  and Lopid  for secondary stroke prevention -Patient aware to stop aspirin in July and she will have completed 49-month therapy -F/u with PCP regarding your HLD, HTN and DM management -continue to monitor BP at home -Maintain strict control of hypertension with blood pressure goal below 130/90, diabetes with hemoglobin A1c goal below 6.5% and cholesterol with LDL cholesterol (bad cholesterol) goal below 70 mg/dL. I also advised the patient to eat a healthy diet with plenty of whole grains, cereals, fruits and vegetables, exercise regularly and maintain  ideal body weight.  Follow up in 4 months or call earlier if needed   Greater than 50% of time during this 25 minute visit was spent on counseling,explanation of diagnosis of right CR infarct, reviewing risk factor management of HTN, DM HLD, planning of further management, discussion with patient and family and coordination of care    Venancio Poisson, [facility]  [facility] [address] [place], [place] [address]  Phone [phone] Fax [phone]

## 2018-04-23 NOTE — Patient Instructions (Addendum)
Continue aspirin, plavix and pletal  and lopid  for secondary stroke prevention  Stop aspirin in July as you have completed 3 month therapy  Continue to follow up with PCP regarding cholesterol, blood pressure and diabetes management   Continue to monitor blood pressure at home  Maintain strict control of hypertension with blood pressure goal below 130/90, diabetes with hemoglobin A1c goal below 6.5% and cholesterol with LDL cholesterol (bad cholesterol) goal below 70 mg/dL. I also advised the patient to eat a healthy diet with plenty of whole grains, cereals, fruits and vegetables, exercise regularly and maintain ideal body weight.  Followup in the future with me in 4 months or call earlier if needed         Thank you for coming to see [place] at [facility]. I hope we have been able to provide you high quality care today.  You may receive a patient satisfaction survey over the next few weeks. We would appreciate your feedback and comments so that we may continue to improve ourselves and the health of our patients.

## 2018-04-24 NOTE — Progress Notes (Signed)
I agree with the above plan 

## 2018-04-26 ENCOUNTER — Other Ambulatory Visit: Payer: Self-pay | Admitting: *Deleted

## 2018-04-26 ENCOUNTER — Other Ambulatory Visit: Payer: Self-pay | Admitting: Internal Medicine

## 2018-04-26 DIAGNOSIS — I10 Essential (primary) hypertension: Secondary | ICD-10-CM

## 2018-04-26 NOTE — Telephone Encounter (Signed)
Patient needs refill on bp medicine, pls

## 2018-04-26 NOTE — Telephone Encounter (Signed)
Patient is requesting refill on bp medicine, [facility] [address]  Patient is at pharmacy

## 2018-04-28 MED ORDER — GEMFIBROZIL 600 MG PO TABS: 600.0000 mg | ORAL_TABLET | Freq: Two times a day (BID) | ORAL | 0 refills | Status: DC

## 2018-04-28 MED ORDER — LOSARTAN POTASSIUM 100 MG PO TABS: 100.0000 mg | ORAL_TABLET | Freq: Every day | ORAL | 3 refills | Status: DC

## 2018-04-29 LAB — CUP PACEART REMOTE DEVICE CHECK
Date Time Interrogation Session: 20190523164021
Implantable Pulse Generator Implant Date: 20190423

## 2018-05-06 ENCOUNTER — Ambulatory Visit (INDEPENDENT_AMBULATORY_CARE_PROVIDER_SITE_OTHER): Payer: Medicare HMO | Admitting: *Deleted

## 2018-05-06 DIAGNOSIS — I63311 Cerebral infarction due to thrombosis of right middle cerebral artery: Secondary | ICD-10-CM | POA: Diagnosis not present

## 2018-05-08 ENCOUNTER — Encounter: Payer: Self-pay | Admitting: Physical Medicine & Rehabilitation

## 2018-05-08 ENCOUNTER — Encounter: Payer: Medicare HMO | Admitting: Physical Medicine & Rehabilitation

## 2018-05-08 ENCOUNTER — Encounter: Payer: Medicare HMO | Attending: Physical Medicine & Rehabilitation | Admitting: Physical Medicine & Rehabilitation

## 2018-05-08 VITALS — BP 157/83 | HR 76 | Ht 62.0 in | Wt 167.0 lb

## 2018-05-08 DIAGNOSIS — N189 Chronic kidney disease, unspecified: Secondary | ICD-10-CM | POA: Insufficient documentation

## 2018-05-08 DIAGNOSIS — I63411 Cerebral infarction due to embolism of right middle cerebral artery: Secondary | ICD-10-CM | POA: Diagnosis not present

## 2018-05-08 DIAGNOSIS — E1122 Type 2 diabetes mellitus with diabetic chronic kidney disease: Secondary | ICD-10-CM | POA: Insufficient documentation

## 2018-05-08 DIAGNOSIS — Z9851 Tubal ligation status: Secondary | ICD-10-CM | POA: Insufficient documentation

## 2018-05-08 DIAGNOSIS — K5900 Constipation, unspecified: Secondary | ICD-10-CM | POA: Insufficient documentation

## 2018-05-08 DIAGNOSIS — I2510 Atherosclerotic heart disease of native coronary artery without angina pectoris: Secondary | ICD-10-CM | POA: Diagnosis not present

## 2018-05-08 DIAGNOSIS — K219 Gastro-esophageal reflux disease without esophagitis: Secondary | ICD-10-CM | POA: Diagnosis not present

## 2018-05-08 DIAGNOSIS — Z794 Long term (current) use of insulin: Secondary | ICD-10-CM | POA: Diagnosis not present

## 2018-05-08 DIAGNOSIS — E1151 Type 2 diabetes mellitus with diabetic peripheral angiopathy without gangrene: Secondary | ICD-10-CM | POA: Insufficient documentation

## 2018-05-08 DIAGNOSIS — E039 Hypothyroidism, unspecified: Secondary | ICD-10-CM | POA: Insufficient documentation

## 2018-05-08 DIAGNOSIS — Z87891 Personal history of nicotine dependence: Secondary | ICD-10-CM | POA: Diagnosis not present

## 2018-05-08 DIAGNOSIS — E785 Hyperlipidemia, unspecified: Secondary | ICD-10-CM | POA: Insufficient documentation

## 2018-05-08 DIAGNOSIS — G8194 Hemiplegia, unspecified affecting left nondominant side: Secondary | ICD-10-CM

## 2018-05-08 DIAGNOSIS — I129 Hypertensive chronic kidney disease with stage 1 through stage 4 chronic kidney disease, or unspecified chronic kidney disease: Secondary | ICD-10-CM | POA: Diagnosis not present

## 2018-05-08 DIAGNOSIS — F482 Pseudobulbar affect: Secondary | ICD-10-CM | POA: Diagnosis not present

## 2018-05-08 DIAGNOSIS — R2681 Unsteadiness on feet: Secondary | ICD-10-CM | POA: Diagnosis not present

## 2018-05-08 DIAGNOSIS — Z9889 Other specified postprocedural states: Secondary | ICD-10-CM | POA: Diagnosis not present

## 2018-05-08 DIAGNOSIS — E1129 Type 2 diabetes mellitus with other diabetic kidney complication: Secondary | ICD-10-CM | POA: Diagnosis not present

## 2018-05-08 NOTE — Patient Instructions (Signed)
PLEASE FEEL FREE TO CALL OUR OFFICE WITH ANY PROBLEMS OR QUESTIONS ([phone])     INCREASE YOUR WALKING. [facility] [employer]. GET INTO THE POOL.

## 2018-05-08 NOTE — Progress Notes (Signed)
Carelink Summary Report / Loop Recorder 

## 2018-05-08 NOTE — Progress Notes (Signed)
Subjective:    Patient ID: [name], female    DOB: 01-12-1951, 67 y.o.   MRN: [id number]  HPI   [name] is here in follow-up of her right corona radiata infarct. She has returned to driving. She has been picking up grandkids from school and actvities. She is walking without a device. She notices occasionally that her left foot will drive. She likes to walk but can't stand the heat. She has thought about using her silver sneakers pass but hasn't made it yet.   Her sugars have been a little labile although she attributes it to a death in the family and she was eating like she should.   She reports occasional emotional moments (like when she watches tv) but they are short lasting and not impacting relations with others or her social activity  Pain Inventory Average Pain 0 Pain Right Now 0 My pain is na  In the last 24 hours, has pain interfered with the following? General activity na Relation with others na Enjoyment of life na What TIME of day is your pain at its worst? na Sleep (in general) Good  Pain is worse with: na Pain improves with: na Relief from Meds: na  Mobility walk without assistance ability to climb steps?  yes do you drive?  yes  Function not employed: date last employed .  Neuro/Psych dizziness  Prior Studies Any changes since last visit?  no  Physicians involved in your care Any changes since last visit?  no   Family History  Problem Relation Age of Onset  . Diabetes Brother   . Prostate cancer Brother   . Diabetes Mother   . Diabetes Father   . Diabetes Sister   . Esophageal cancer Neg Hx   . Rectal cancer Neg Hx   . Stomach cancer Neg Hx   . Colon cancer Neg Hx   . Breast cancer Neg Hx    Social History   Socioeconomic History  . Marital status: Single    Spouse name: Not on file  . Number of children: 1  . Years of education: 42th- [place]  . Highest education level: Not on file  Occupational History  . Occupation: Retired    Scientific laboratory technician  . Financial resource strain: Not on file  . Food insecurity:    Worry: Not on file    Inability: Not on file  . Transportation needs:    Medical: Not on file    Non-medical: Not on file  Tobacco Use  . Smoking status: Former Smoker    Last attempt to quit: 02/04/2011    Years since quitting: 7.2  . Smokeless tobacco: Never Used  Substance and Sexual Activity  . Alcohol use: No    Alcohol/week: 0.0 oz  . Drug use: No  . Sexual activity: Not on file  Lifestyle  . Physical activity:    Days per week: Not on file    Minutes per session: Not on file  . Stress: Not on file  Relationships  . Social connections:    Talks on phone: Not on file    Gets together: Not on file    Attends religious service: Not on file    Active member of club or organization: Not on file    Attends meetings of clubs or organizations: Not on file    Relationship status: Not on file  Other Topics Concern  . Not on file  Social History Narrative  . Not on file  Past Surgical History:  Procedure Laterality Date  . CESAREAN SECTION    . LEFT HEART CATHETERIZATION WITH CORONARY ANGIOGRAM N/A 03/08/2012   Procedure: LEFT HEART CATHETERIZATION WITH CORONARY ANGIOGRAM;  Surgeon: Sinclair Grooms, MD;  Location: [facility] CATH LAB;  Service: Cardiovascular;  Laterality: N/A;  . LEFT HEART CATHETERIZATION WITH CORONARY ANGIOGRAM N/A 12/17/2012   Procedure: LEFT HEART CATHETERIZATION WITH CORONARY ANGIOGRAM;  Surgeon: Laverda Page, MD;  Location: [facility] CATH LAB;  Service: Cardiovascular;  Laterality: N/A;  . LOOP RECORDER INSERTION N/A 03/05/2018   Procedure: LOOP RECORDER INSERTION;  Surgeon: Evans Lance, MD;  Location: [place] CV LAB;  Service: Cardiovascular;  Laterality: N/A;  . LOWER EXTREMITY ANGIOGRAM N/A 12/17/2012   Procedure: LOWER EXTREMITY ANGIOGRAM;  Surgeon: Laverda Page, MD;  Location: [facility] CATH LAB;  Service: Cardiovascular;  Laterality: N/A;  . TEE WITHOUT CARDIOVERSION N/A  02/28/2018   Procedure: TRANSESOPHAGEAL ECHOCARDIOGRAM (TEE);  Surgeon: Jerline Pain, MD;  Location: [facility] ENDOSCOPY;  Service: Cardiovascular;  Laterality: N/A;  . TUBAL LIGATION     Past Medical History:  Diagnosis Date  . Anemia   . Arteriosclerotic cardiovascular disease   . Claudication in peripheral vascular disease ([place])   . CVA (cerebral vascular accident) ([place]) 01/2011   affected left side  . Diabetes mellitus 2000  . GERD (gastroesophageal reflux disease)   . Hyperlipidemia   . Hypertension   . Hypothyroidism   . Renal insufficiency   . Vitamin D deficiency    BP (!) 157/83   Pulse 76   Ht 5\' 2"  (1.575 m)   Wt 167 lb (75.8 kg)   SpO2 94%   BMI 30.54 kg/m   Opioid Risk Score:   Fall Risk Score:  `1  Depression screen PHQ 2/9  Depression screen [facility] 2/9 03/27/2018 03/22/2018 02/15/2018 10/24/2017 08/07/2017 06/21/2017 04/25/2017  Decreased Interest 0 0 0 0 0 0 0  Down, Depressed, Hopeless 0 0 0 0 0 0 0  PHQ - 2 Score 0 0 0 0 0 0 0  Some recent data might be hidden     Review of Systems  Constitutional: Negative.   HENT: Negative.   Eyes: Negative.   Respiratory: Negative.   Cardiovascular: Negative.   Gastrointestinal: Negative.   Endocrine: Negative.   Genitourinary: Negative.   Musculoskeletal: Negative.   Skin: Negative.   Allergic/Immunologic: Negative.   Neurological: Positive for dizziness.  Hematological: Negative.   Psychiatric/Behavioral: Negative.   All other systems reviewed and are negative.      Objective:   Physical Exam General: No acute distress HEENT: EOMI, oral membranes moist Cards: reg rate  Chest: normal effort Abdomen: Soft, NT, ND Skin: dry, intact Extremities: no edema  Neurological: alert and oriented Speech clear Motor: LUE 4+ to 5d/5 LLE: 4+ to 5/5 proximal to distal  RUE/RLE: 5/5 proximal to distal- normal sensation Skin: Skin is warm and dry. She is not diaphoretic.  Psychiatric: affect normal          Assessment & Plan:  1. Unsteady gait, poor safety with impulsivity secondary to acute/early subacute infarct in right mid corona radiata with few adjacent punctate foci.  -Much improved. Near baseline -continue hep.  -encouraged increased walking, [employer], water activities 2.. T2DM:             -continue current insulin regimen  -she's aware of what she needs to do control her sugars 3. Pseudobulbar affect: minimal issues. Occasionally gets emotional watching TV.   No  rx needed 4.  HTN: --continue cozaar 5.. Hypothyroid: On supplement  6. Constipation: resolve 7. CKD per primary

## 2018-05-16 ENCOUNTER — Other Ambulatory Visit: Payer: Self-pay | Admitting: Internal Medicine

## 2018-05-21 NOTE — Progress Notes (Signed)
CC: HTN  HPI:  [name] is a 67 y.o. with a PMH of CVA in 2012 and 2019, PVD, T2DM, and HTN presenting to clinic for follow up on HTN, T2DM.  HTN: Patient endorses compliance with losartan 100mg  daily. Her HCTZ was discontinued during recent hospitalization. She denies HAs, vision or hearing changes, new weakness/numbness/tingling.   T2DM: Patient endorses compliance with Novolin 70/30 25 units qAM and 30 units qPM. She endorses a couple of low CBGs in the 70s last month but denies symptoms. She denies polyuria, polydipsia, nausea, vomiting. She has been watching what she eats and is determined to get her sugars under control.   Recent CVA likely 2/2 severe R A&MCA atherosclerosis: Symptoms of dysarthria and left sided weakness resolved. She was placed on ASA 325mg  daily in addition to her chronic plavix/pletal daily. She has followed up with neurology in June who hold with their prior recommendation of stopping asa in July after 3 months of triple therapy and just continue with plavix/pletal.   Abnormal vaginal bleeding: Patient reports years long history of vaginal spotting as best as she could tell; denies melena, hematochezia, hematuria, dyspareunia.    Patient endorses some generalized fatigue and wonders if her vit D level is low again; was previously on vit D supplementation years ago.   Please see problem based Assessment and Plan for status of patients chronic conditions.  Past Medical History:  Diagnosis Date  . Anemia   . Arteriosclerotic cardiovascular disease   . CKD (chronic kidney disease) stage 3, GFR 30-59 ml/min (HCC)   . Claudication in peripheral vascular disease ([place])   . CVA (cerebral vascular accident) ([place])    2012 and 2019-affected left side + dysarthria 2/2 severe R ACA and MCA stenosis  . GERD (gastroesophageal reflux disease)   . Hyperlipidemia    declines statins  . Hypertension   . Hypothyroidism   . Type 2 diabetes mellitus ([place]) 2000  .  Vitamin D deficiency     Review of Systems:   ROS Per HPI  Physical Exam:  Vitals:   05/22/18 1448  BP: (!) 147/52  Pulse: 80  Temp: 98.3 F (36.8 C)  TempSrc: Oral  SpO2: 100%  Weight: 164 lb 14.4 oz (74.8 kg)  Height: 5\' 2"  (1.575 m)   GENERAL- alert, co-operative, appears as stated age, not in any distress. HEENT- Atraumatic, normocephalic, PERRL, EOMI, oral mucosa appears moist. CARDIAC- RRR, no murmurs, rubs or gallops. RESP- Moving equal volumes of air, and clear to auscultation bilaterally, no wheezes or crackles. ABDOMEN- Soft, nontender, bowel sounds present. NEURO- CN 2-12 intact, UE strength almost equal, BLE strength intact. EXTREMITIES- pulse 2+, symmetric, no pedal edema. SKIN- Warm, dry, no rash or lesion. PSYCH- Normal mood and affect, appropriate thought content and speech.  Assessment & Plan:   See Encounters Tab for problem based charting.  HYPERTENSION, BENIGN SYSTEMIC Uncontrolled even on repeat. She is asymptomatic.  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 She was placed on ASA 325mg  daily in addition to her chronic plavix/pletal daily. She has followed up with neurology in June who hold with their prior recommendation of stopping asa in July after 3 months of triple therapy and just continue with plavix/pletal.   She is worried about stopping aspirin due  to fear of another stroke. We discussed neurology recommendations, the increased risk of bleeding, and that we are continuing to address the larger risk factors like her diabetes and hyperlipidemia to decrease her risk of further strokes. She continues to decline statin therapy again today.  Plan: --stop aspirin --continue plavix and pletal --DM discussed elsewhere --continue addressing  starting statin during future visits  Diabetes mellitus with stage 3 chronic kidney disease ([place]) Patient endorses compliance with Novolin 70/30 25 units qAM and 30 units qPM. She endorses a couple of low CBGs in the 70s last month but denies symptoms. She denies polyuria, polydipsia, nausea, vomiting. She has been watching what she eats and is determined to get her sugars under control.   We downloaded her meter today which revealed an average CBG of 139, lowest value of 71 and highest of 216.   Plan: --A1c today improved to 6.9 --Continue novolin 70/30 25 units qAM and 30 units qPM --advised to call if she is having hypoglycemic episodes  CKD (chronic kidney disease), stage III ([place]) Bmet shows stable renal function  Abnormal uterine bleeding Patient reports years long history of vaginal spotting as best as she could tell; denies melena, hematochezia, hematuria, dyspareunia. She has not noticed an increase or change in this spotting. Pap smear 9/18 was wnl and genital exam at the time was unremarkable.   Plan was to refer patient to gynecology with which she was initially in agreement, however on calling patient back about her lab results she stated that she does not want to pursue this at the time due to fear of what might be found. We discussed at length but she could not be persuaded. Will continue addressing at future visits or at least if she is agreeable to ultrasounds.  Vitamin D deficiency Vit D low at 81. Will replete with 1000 units daily and recheck in a couple of months.    Patient discussed with Dr. Verdie Drown, MD Internal Medicine PGY-3

## 2018-05-22 ENCOUNTER — Other Ambulatory Visit: Payer: Self-pay

## 2018-05-22 ENCOUNTER — Encounter: Payer: Self-pay | Admitting: Internal Medicine

## 2018-05-22 ENCOUNTER — Ambulatory Visit (INDEPENDENT_AMBULATORY_CARE_PROVIDER_SITE_OTHER): Payer: Medicare HMO | Admitting: Internal Medicine

## 2018-05-22 VITALS — BP 147/52 | HR 80 | Temp 98.3°F | Ht 62.0 in | Wt 164.9 lb

## 2018-05-22 DIAGNOSIS — Z7902 Long term (current) use of antithrombotics/antiplatelets: Secondary | ICD-10-CM

## 2018-05-22 DIAGNOSIS — R5383 Other fatigue: Secondary | ICD-10-CM

## 2018-05-22 DIAGNOSIS — Z8673 Personal history of transient ischemic attack (TIA), and cerebral infarction without residual deficits: Secondary | ICD-10-CM | POA: Diagnosis not present

## 2018-05-22 DIAGNOSIS — I129 Hypertensive chronic kidney disease with stage 1 through stage 4 chronic kidney disease, or unspecified chronic kidney disease: Secondary | ICD-10-CM

## 2018-05-22 DIAGNOSIS — N183 Chronic kidney disease, stage 3 unspecified: Secondary | ICD-10-CM

## 2018-05-22 DIAGNOSIS — E1151 Type 2 diabetes mellitus with diabetic peripheral angiopathy without gangrene: Secondary | ICD-10-CM

## 2018-05-22 DIAGNOSIS — E1122 Type 2 diabetes mellitus with diabetic chronic kidney disease: Secondary | ICD-10-CM

## 2018-05-22 DIAGNOSIS — I63411 Cerebral infarction due to embolism of right middle cerebral artery: Secondary | ICD-10-CM

## 2018-05-22 DIAGNOSIS — Z794 Long term (current) use of insulin: Secondary | ICD-10-CM

## 2018-05-22 DIAGNOSIS — E559 Vitamin D deficiency, unspecified: Secondary | ICD-10-CM

## 2018-05-22 DIAGNOSIS — Z79899 Other long term (current) drug therapy: Secondary | ICD-10-CM

## 2018-05-22 DIAGNOSIS — Z7982 Long term (current) use of aspirin: Secondary | ICD-10-CM

## 2018-05-22 DIAGNOSIS — I10 Essential (primary) hypertension: Secondary | ICD-10-CM

## 2018-05-22 DIAGNOSIS — N939 Abnormal uterine and vaginal bleeding, unspecified: Secondary | ICD-10-CM

## 2018-05-22 LAB — GLUCOSE, CAPILLARY: GLUCOSE-CAPILLARY: 112 mg/dL — AB (ref 70–99)

## 2018-05-22 LAB — POCT GLYCOSYLATED HEMOGLOBIN (HGB A1C): Hemoglobin A1C: 6.9 % — AB (ref 4.0–5.6)

## 2018-05-22 MED ORDER — HYDROCHLOROTHIAZIDE 12.5 MG PO TABS: 12.5000 mg | ORAL_TABLET | Freq: Every day | ORAL | 1 refills | Status: DC

## 2018-05-22 NOTE — Patient Instructions (Addendum)
We will start Hydrochlorothiazide 12.5 mg daily in addition to your losartan for blood pressure.   Continue your diabetes medications as they are.  Please come back in 1 month to check your blood pressure.

## 2018-05-23 LAB — BMP8+ANION GAP
ANION GAP: 18 mmol/L (ref 10.0–18.0)
BUN/Creatinine Ratio: 17 (ref 12–28)
BUN: 26 mg/dL (ref 8–27)
CO2: 20 mmol/L (ref 20–29)
CREATININE: 1.55 mg/dL — AB (ref 0.57–1.00)
Calcium: 9.6 mg/dL (ref 8.7–10.3)
Chloride: 106 mmol/L (ref 96–106)
GFR calc Af Amer: 40 mL/min/{1.73_m2} — ABNORMAL LOW (ref >59)
GFR calc non Af Amer: 35 mL/min/{1.73_m2} — ABNORMAL LOW (ref >59)
Glucose: 120 mg/dL — ABNORMAL HIGH (ref 65–99)
Potassium: 4 mmol/L (ref 3.5–5.2)
SODIUM: 144 mmol/L (ref 134–144)

## 2018-05-23 LAB — VITAMIN D 25 HYDROXY (VIT D DEFICIENCY, FRACTURES): VIT D 25 HYDROXY: 18.6 ng/mL — AB (ref 30.0–100.0)

## 2018-05-24 MED ORDER — VITAMIN D-3 25 MCG (1000 UT) PO CAPS: 1000.0000 [IU] | ORAL_CAPSULE | Freq: Every day | ORAL | 1 refills | Status: DC

## 2018-05-25 ENCOUNTER — Encounter: Payer: Self-pay | Admitting: Internal Medicine

## 2018-05-25 NOTE — Assessment & Plan Note (Signed)
Bmet shows stable renal function

## 2018-05-25 NOTE — Assessment & Plan Note (Signed)
Patient endorses compliance with Novolin 70/30 25 units qAM and 30 units qPM. She endorses a couple of low CBGs in the 70s last month but denies symptoms. She denies polyuria, polydipsia, nausea, vomiting. She has been watching what she eats and is determined to get her sugars under control.   We downloaded her meter today which revealed an average CBG of 139, lowest value of 71 and highest of 216.   Plan: --A1c today improved to 6.9 --Continue novolin 70/30 25 units qAM and 30 units qPM --advised to call if she is having hypoglycemic episodes

## 2018-05-25 NOTE — Assessment & Plan Note (Signed)
Symptoms of dysarthria and left sided weakness resolved. She was placed on ASA 325mg  daily in addition to her chronic plavix/pletal daily. She has followed up with neurology in June who hold with their prior recommendation of stopping asa in July after 3 months of triple therapy and just continue with plavix/pletal.   She is worried about stopping aspirin due to fear of another stroke. We discussed neurology recommendations, the increased risk of bleeding, and that we are continuing to address the larger risk factors like her diabetes and hyperlipidemia to decrease her risk of further strokes. She continues to decline statin therapy again today.  Plan: --stop aspirin --continue plavix and pletal --DM discussed elsewhere --continue addressing starting statin during future visits

## 2018-05-25 NOTE — Assessment & Plan Note (Signed)
Uncontrolled even on repeat. She is asymptomatic.  Plan: --continue losartan 100mg  daiy --restart HCTZ 12.5mg  daily as she has changed her diet and lost a few pounds --f/u in 1 month

## 2018-05-25 NOTE — Assessment & Plan Note (Signed)
Patient reports years long history of vaginal spotting as best as she could tell; denies melena, hematochezia, hematuria, dyspareunia. She has not noticed an increase or change in this spotting. Pap smear 9/18 was wnl and genital exam at the time was unremarkable.   Plan was to refer patient to gynecology with which she was initially in agreement, however on calling patient back about her lab results she stated that she does not want to pursue this at the time due to fear of what might be found. We discussed at length but she could not be persuaded. Will continue addressing at future visits or at least if she is agreeable to ultrasounds.

## 2018-05-25 NOTE — Assessment & Plan Note (Signed)
Vit D low at 18. Will replete with 1000 units daily and recheck in a couple of months.

## 2018-05-27 NOTE — Progress Notes (Signed)
Medicine attending: Medical history, presenting problems, physical findings, and medications, reviewed with resident physician Dr [name] on the day of the patient visit and I concur with her evaluation and management plan. Despite best efforts by Dr Jari Favre, patient not willing to follow up with Ob/Gyn re chronic, intermittent vaginal bleeding. We will continue to encourage her to do this. May need to involve a close family member.

## 2018-05-28 ENCOUNTER — Other Ambulatory Visit: Payer: Self-pay | Admitting: *Deleted

## 2018-05-28 NOTE — Telephone Encounter (Signed)
[facility] - pt had lost her bottle of Losartan and cannot afford to pay cash for refill. So[facility] for alternative strength so her insurance will pay for it. Thanks

## 2018-05-29 MED ORDER — OLMESARTAN MEDOXOMIL 40 MG PO TABS: 40.0000 mg | ORAL_TABLET | Freq: Every day | ORAL | 0 refills | Status: DC

## 2018-05-29 NOTE — Telephone Encounter (Signed)
Sent in prescription for olmesartan 40mg  daily (losartan 100mg  daily equivalent) for 30d to replace lost losartan for this month. She will go back to losartan afterwards.  [name]

## 2018-05-30 NOTE — Telephone Encounter (Signed)
[facility] to [facility] to [facility] after 30 days per Dr Jari Favre.

## 2018-06-10 ENCOUNTER — Ambulatory Visit (INDEPENDENT_AMBULATORY_CARE_PROVIDER_SITE_OTHER): Payer: Medicare HMO | Admitting: *Deleted

## 2018-06-10 DIAGNOSIS — I63311 Cerebral infarction due to thrombosis of right middle cerebral artery: Secondary | ICD-10-CM | POA: Diagnosis not present

## 2018-06-10 NOTE — Progress Notes (Signed)
Carelink Summary Report / Loop Recorder 

## 2018-06-12 ENCOUNTER — Telehealth: Payer: Self-pay | Admitting: Dietician

## 2018-06-12 ENCOUNTER — Encounter: Payer: Self-pay | Admitting: Dietician

## 2018-06-12 NOTE — Telephone Encounter (Signed)
MIYUKI RZASA is a 67 y.o. female who was contacted on behalf of [facility] Geriatrics Task Force.   Diabetes Assessment  DM meds and BS checks -  "What medications are you taking for diabetes?" insulin 25 units in am, victoza 1.2 in pm -  "How often do you check your blood sugars at home?" 2x/day - "What have your blood sugars been?" 120/129/169/159/108  High Blood Pressure Assessment  BP meds & BP checks -  "What medications are you taking for high blood pressure?" hctz, pletal, plavix, losartan - "How often do you check your blood pressure at home?" no - "What have your blood pressure readings been?" n/a  Coping with DM and BP - What else are you doing to help with your DM and BP - Diet? Vegetables, beans - Exercise? Walking indoors  Medication Access Issues  Medication Issues? -  "Are you getting your medicines refilled on time without skipping any doses?" no - "Are you having any problems getting/taking your meds (cost, timing, transportation)?" no - Do you need any meds refilled? no  Conclusion  Close the call - Date of follow-up visit scheduled 07/24/18 @ 1:15PM, scheduled with myself on same day - "Any other questions or concerns?" no

## 2018-06-15 ENCOUNTER — Other Ambulatory Visit: Payer: Self-pay | Admitting: Internal Medicine

## 2018-06-15 DIAGNOSIS — I10 Essential (primary) hypertension: Secondary | ICD-10-CM

## 2018-06-18 LAB — CUP PACEART REMOTE DEVICE CHECK
Date Time Interrogation Session: 20190625174051
MDC IDC PG IMPLANT DT: 20190423

## 2018-06-19 ENCOUNTER — Telehealth: Payer: Self-pay

## 2018-06-19 NOTE — Telephone Encounter (Signed)
Pt was not sure she should get a refill on this med, wanted to know if she should continue taking, advised to continue

## 2018-06-19 NOTE — Telephone Encounter (Signed)
Question about hydrochlorothiazide (HYDRODIURIL) 12.5 MG tablet. Please call pt back.

## 2018-06-19 NOTE — Telephone Encounter (Signed)
Yes she should keep taking it. This is for her blood pressure. She was supposed to be seen around now for BP check and labs since starting this back (it's been about a month) but it looks like she was scheduled 2 months out (for September). Can we have her be seen in [facility] for BP and Bmet in the next week and ensure she is taking the HCTZ prior to the appointment.  Thank you!  Estill Dooms

## 2018-06-20 NOTE — Telephone Encounter (Signed)
Just spoke with patient and she agreed to an appt for next Thursday 06/27/18 at 3:15 pm in [facility].

## 2018-06-27 ENCOUNTER — Encounter: Payer: Self-pay | Admitting: Internal Medicine

## 2018-06-27 ENCOUNTER — Ambulatory Visit (INDEPENDENT_AMBULATORY_CARE_PROVIDER_SITE_OTHER): Payer: Medicare HMO | Admitting: Internal Medicine

## 2018-06-27 ENCOUNTER — Other Ambulatory Visit: Payer: Self-pay

## 2018-06-27 VITALS — BP 113/52 | HR 81 | Temp 97.8°F | Ht 62.0 in | Wt 162.5 lb

## 2018-06-27 DIAGNOSIS — E785 Hyperlipidemia, unspecified: Secondary | ICD-10-CM

## 2018-06-27 DIAGNOSIS — E1122 Type 2 diabetes mellitus with diabetic chronic kidney disease: Secondary | ICD-10-CM

## 2018-06-27 DIAGNOSIS — E559 Vitamin D deficiency, unspecified: Secondary | ICD-10-CM

## 2018-06-27 DIAGNOSIS — Z79899 Other long term (current) drug therapy: Secondary | ICD-10-CM

## 2018-06-27 DIAGNOSIS — N183 Chronic kidney disease, stage 3 (moderate): Secondary | ICD-10-CM | POA: Diagnosis not present

## 2018-06-27 DIAGNOSIS — I129 Hypertensive chronic kidney disease with stage 1 through stage 4 chronic kidney disease, or unspecified chronic kidney disease: Secondary | ICD-10-CM

## 2018-06-27 DIAGNOSIS — I10 Essential (primary) hypertension: Secondary | ICD-10-CM | POA: Diagnosis not present

## 2018-06-27 NOTE — Patient Instructions (Signed)
It was a pleasure to see you today Ms. Truett. Congratulations on your weight loss, please continue exercising and eating healthy. Your blood pressure is in good range. Please continue taking losartan 100mg  daily and hydrochlorothiazide 12.5mg  daily.  If you have any questions or concerns, please call our clinic at [phone] between 9am-5pm and after hours call [phone] and ask for the internal medicine resident on call. If you feel you are having a medical emergency please call 911.   Thank you, we look forward to help you remain healthy!  Lars Mage, MD Internal Medicine PGY2

## 2018-06-27 NOTE — Assessment & Plan Note (Addendum)
The patient's blood pressure during this visit was 113/52.  At previous visit the patient's hydrochlorothiazide 12.5 mg daily was restarted.  She is also continue to use losartan 100 mg daily. The patient's blood pressure goal is less than 130/80.  Patient is lost 2 pounds since previous visit. Patient continues to remain asymptomatic.  Assessment and plan Recommend continuing daily and hydrochlorothiazide 12.5 mg daily.  Also encouraged patient to continue exercise and eating healthy.  Obtained a BMP to check for kidney function since hydrochlorthiazide was re-added.

## 2018-06-27 NOTE — Progress Notes (Signed)
   CC: Hypertension follow-up  HPI:  [name] is a 67 y.o. female with hypertension, CKD 3, hyperlipidemia, vitamin D, diabetes mellitus type 2 who presents for blood pressure follow-up. Please see problem based charting for evaluation, assessment, and plan.  Past Medical History:  Diagnosis Date  . Anemia   . Arteriosclerotic cardiovascular disease   . CKD (chronic kidney disease) stage 3, GFR 30-59 ml/min (HCC)   . Claudication in peripheral vascular disease ([place])   . CVA (cerebral vascular accident) ([place])    2012 and 2019-affected left side + dysarthria 2/2 severe R ACA and MCA stenosis  . GERD (gastroesophageal reflux disease)   . Hyperlipidemia    declines statins  . Hypertension   . Hypothyroidism   . Type 2 diabetes mellitus ([place]) 2000  . Vitamin D deficiency    Review of Systems:    Denies any blurry vision, chest pain, shortness of breath, abdominal pain  Physical Exam:  Vitals:   06/27/18 1454  BP: (!) 113/52  Pulse: 81  Temp: 97.8 F (36.6 C)  TempSrc: Oral  SpO2: 97%  Weight: 162 lb 8 oz (73.7 kg)  Height: 5\' 2"  (1.575 m)   Physical Exam  Constitutional: She appears well-developed and well-nourished. No distress.  HENT:  Head: Normocephalic and atraumatic.  Eyes: Conjunctivae are normal.  Cardiovascular: Normal rate, regular rhythm and normal heart sounds.  Respiratory: Effort normal and breath sounds normal. No respiratory distress. She has no wheezes.  GI: Soft. Bowel sounds are normal. She exhibits no distension. There is no tenderness.  Musculoskeletal: She exhibits no edema.  Neurological: She is alert.  Skin: She is not diaphoretic. No erythema.  Psychiatric: She has a normal mood and affect. Her behavior is normal. Judgment and thought content normal.    Assessment & Plan:   See Encounters Tab for problem based charting.  Hypertension The patient's blood pressure during this visit was 113/52.  At previous visit the patient's  hydrochlorothiazide 12.5 mg daily was restarted.  She is also continue to use losartan 100 mg daily. The patient's blood pressure goal is less than 130/80.  Patient is lost 2 pounds since previous visit.  Patient continues to remain asymptomatic.  Assessment and plan Recommend continuing daily and hydrochlorothiazide 12.5 mg daily.  Also encouraged patient to continue exercise and eating healthy.  Obtained a BMP to check for kidney function since hydrochlorthiazide was re-added.  Patient discussed with Dr. Lynnae January

## 2018-06-28 ENCOUNTER — Telehealth: Payer: Self-pay | Admitting: Internal Medicine

## 2018-06-28 ENCOUNTER — Other Ambulatory Visit: Payer: Self-pay | Admitting: Internal Medicine

## 2018-06-28 LAB — BMP8+ANION GAP
ANION GAP: 18 mmol/L (ref 10.0–18.0)
BUN / CREAT RATIO: 20 (ref 12–28)
BUN: 30 mg/dL — ABNORMAL HIGH (ref 8–27)
CO2: 18 mmol/L — ABNORMAL LOW (ref 20–29)
CREATININE: 1.49 mg/dL — AB (ref 0.57–1.00)
Calcium: 10.1 mg/dL (ref 8.7–10.3)
Chloride: 108 mmol/L — ABNORMAL HIGH (ref 96–106)
GFR calc Af Amer: 42 mL/min/{1.73_m2} — ABNORMAL LOW (ref >59)
GFR, EST NON AFRICAN AMERICAN: 36 mL/min/{1.73_m2} — AB (ref >59)
Glucose: 111 mg/dL — ABNORMAL HIGH (ref 65–99)
Potassium: 3.6 mmol/L (ref 3.5–5.2)
Sodium: 144 mmol/L (ref 134–144)

## 2018-06-28 NOTE — Telephone Encounter (Signed)
Pt missed called, pls call back [phone]

## 2018-06-28 NOTE — Progress Notes (Signed)
Internal Medicine Clinic Attending  Case discussed with Dr. Chundi at the time of the visit.  We reviewed the resident's history and exam and pertinent patient test results.  I agree with the assessment, diagnosis, and plan of care documented in the resident's note. 

## 2018-07-01 NOTE — Telephone Encounter (Signed)
Please inform patient that her kidney function has remained stable.

## 2018-07-02 NOTE — Telephone Encounter (Signed)
Called pt this am, gave her message, she thanked [place], ask if she needed anything else and she said no

## 2018-07-12 ENCOUNTER — Ambulatory Visit (INDEPENDENT_AMBULATORY_CARE_PROVIDER_SITE_OTHER): Payer: Medicare HMO | Admitting: *Deleted

## 2018-07-12 DIAGNOSIS — I63311 Cerebral infarction due to thrombosis of right middle cerebral artery: Secondary | ICD-10-CM | POA: Diagnosis not present

## 2018-07-13 NOTE — Progress Notes (Signed)
Carelink Summary Report / Loop Recorder 

## 2018-07-14 ENCOUNTER — Other Ambulatory Visit: Payer: Self-pay | Admitting: Internal Medicine

## 2018-07-14 DIAGNOSIS — I10 Essential (primary) hypertension: Secondary | ICD-10-CM

## 2018-07-22 LAB — CUP PACEART REMOTE DEVICE CHECK
Date Time Interrogation Session: 20190728181103
Implantable Pulse Generator Implant Date: 20190423

## 2018-07-23 ENCOUNTER — Other Ambulatory Visit: Payer: Self-pay | Admitting: Internal Medicine

## 2018-07-23 DIAGNOSIS — N183 Chronic kidney disease, stage 3 unspecified: Secondary | ICD-10-CM

## 2018-07-23 DIAGNOSIS — E1122 Type 2 diabetes mellitus with diabetic chronic kidney disease: Secondary | ICD-10-CM

## 2018-07-24 ENCOUNTER — Encounter: Payer: Self-pay | Admitting: Internal Medicine

## 2018-07-24 ENCOUNTER — Ambulatory Visit (INDEPENDENT_AMBULATORY_CARE_PROVIDER_SITE_OTHER): Payer: Medicare HMO | Admitting: Internal Medicine

## 2018-07-24 ENCOUNTER — Other Ambulatory Visit: Payer: Self-pay

## 2018-07-24 VITALS — BP 156/68 | HR 72 | Temp 98.7°F | Ht 62.0 in | Wt 163.9 lb

## 2018-07-24 DIAGNOSIS — Z79899 Other long term (current) drug therapy: Secondary | ICD-10-CM

## 2018-07-24 DIAGNOSIS — Z7902 Long term (current) use of antithrombotics/antiplatelets: Secondary | ICD-10-CM

## 2018-07-24 DIAGNOSIS — E1151 Type 2 diabetes mellitus with diabetic peripheral angiopathy without gangrene: Secondary | ICD-10-CM

## 2018-07-24 DIAGNOSIS — Z8673 Personal history of transient ischemic attack (TIA), and cerebral infarction without residual deficits: Secondary | ICD-10-CM

## 2018-07-24 DIAGNOSIS — K219 Gastro-esophageal reflux disease without esophagitis: Secondary | ICD-10-CM

## 2018-07-24 DIAGNOSIS — I714 Abdominal aortic aneurysm, without rupture, unspecified: Secondary | ICD-10-CM | POA: Insufficient documentation

## 2018-07-24 DIAGNOSIS — I10 Essential (primary) hypertension: Secondary | ICD-10-CM | POA: Diagnosis not present

## 2018-07-24 DIAGNOSIS — R5383 Other fatigue: Secondary | ICD-10-CM

## 2018-07-24 DIAGNOSIS — Z0000 Encounter for general adult medical examination without abnormal findings: Secondary | ICD-10-CM

## 2018-07-24 DIAGNOSIS — Z780 Asymptomatic menopausal state: Secondary | ICD-10-CM

## 2018-07-24 DIAGNOSIS — I2510 Atherosclerotic heart disease of native coronary artery without angina pectoris: Secondary | ICD-10-CM | POA: Insufficient documentation

## 2018-07-24 DIAGNOSIS — N939 Abnormal uterine and vaginal bleeding, unspecified: Secondary | ICD-10-CM

## 2018-07-24 DIAGNOSIS — I739 Peripheral vascular disease, unspecified: Secondary | ICD-10-CM

## 2018-07-24 MED ORDER — VITAMIN D-3 25 MCG (1000 UT) PO CAPS: 1000.0000 [IU] | ORAL_CAPSULE | Freq: Every day | ORAL | 1 refills | Status: DC

## 2018-07-24 MED ORDER — RANITIDINE HCL 150 MG PO TABS: 150.0000 mg | ORAL_TABLET | Freq: Every day | ORAL | 1 refills | Status: DC | PRN

## 2018-07-24 MED ORDER — CILOSTAZOL 100 MG PO TABS: 100.0000 mg | ORAL_TABLET | Freq: Two times a day (BID) | ORAL | 5 refills | Status: DC

## 2018-07-24 NOTE — Assessment & Plan Note (Signed)
Refilled Pletal 100mg  BID.

## 2018-07-24 NOTE — Progress Notes (Signed)
   CC: HTN  HPI:  [name] is a 67 y.o. with a PMH of  CVA in 2012 and 2019,PVD, T2DM, and HTNpresenting to clinic for follow up on HTN.  HTN: At last visit with me, patient was restarted on lower dose HCTZ 12.5mg  daily in addition to her losartan 100mg  daily; f/u appt in [place] revealed good control of her BP. Today she endorses compliance with medication regimen; she denies chest pain, shortness of breath, LE edema, headaches, vision or hearing changes, new focal weakness or numbness/tingling.  Please see problem based Assessment and Plan for status of patients chronic conditions.  Past Medical History:  Diagnosis Date  . Anemia   . Arteriosclerotic cardiovascular disease   . CKD (chronic kidney disease) stage 3, GFR 30-59 ml/min (HCC)   . Claudication in peripheral vascular disease ([place])   . CVA (cerebral vascular accident) ([place])    2012 and 2019-affected left side + dysarthria 2/2 severe R ACA and MCA stenosis  . GERD (gastroesophageal reflux disease)   . Hyperlipidemia    declines statins  . Hypertension   . Hypothyroidism   . Type 2 diabetes mellitus ([place]) 2000  . Vitamin D deficiency     Review of Systems:   Per HPI. She continues to endorse some vaginal bleeding.   Physical Exam:  Vitals:   07/24/18 1320 07/24/18 1407  BP: (!) 162/58 (!) 156/68  Pulse: 72   Temp: 98.7 F (37.1 C)   TempSrc: Oral   SpO2: 98%   Weight: 163 lb 14.4 oz (74.3 kg)   Height: 5\' 2"  (1.575 m)    GENERAL- alert, co-operative, appears as stated age, not in any distress. CARDIAC- RRR, no murmurs, rubs or gallops. RESP- Moving equal volumes of air, and clear to auscultation bilaterally, no wheezes or crackles. NEURO- CN 2-12 grossly intact, strength and sensation intact throughout EXTREMITIES- no pedal edema. SKIN- Warm, dry, no rash or lesion. PSYCH- Normal mood and affect, appropriate thought content and speech.  Assessment & Plan:   See Encounters Tab for problem based  charting.   Patient discussed with Dr. Moshe Cipro, MD Internal Medicine PGY-3

## 2018-07-24 NOTE — Assessment & Plan Note (Signed)
Stable. Patient on pletal and plavix; she has stopped asa as planned. Loop recorder so far has not captured arrhythmias.

## 2018-07-24 NOTE — Assessment & Plan Note (Signed)
Patient again declined referral to Gyn, however she states she will think about it before our next appointment. She thinks she will agree to it before the year is over.

## 2018-07-24 NOTE — Patient Instructions (Signed)
Increase your hydrochlorothiazide to 25mg  daily Continue losartan 100mg  daily   We will get a bone density scan and an abdominal ultrasound.

## 2018-07-24 NOTE — Assessment & Plan Note (Signed)
On chart review, 2016 aortogram with Dr. Tamala Julian revealed unmeasured, small AAA; f/u aortogram by Dr. Einar Gip in 2017 didn't mention aortic findings. It doesn't look like she has had f/u imaging since then. She is a former smoker.  Plan: --patient in agreement with AAA doppler U/S --continue working on HTN control

## 2018-07-24 NOTE — Assessment & Plan Note (Signed)
Well controlled on ranitidine prn. Refill provided.

## 2018-07-24 NOTE — Assessment & Plan Note (Signed)
Ordered dexa scan. Patient declines flu shot.

## 2018-07-24 NOTE — Assessment & Plan Note (Signed)
Uncontrolled. Discussed possibly changing HCTZ to chlorthalidone however patient preferred continuing on HCTZ with increased dose for now.  Plan: --increase HCTZ to 25mg  daily --continue losartan 100mg  daily --f/u in 1 month for BP check

## 2018-07-25 NOTE — Progress Notes (Signed)
Internal Medicine Clinic Attending  Case discussed with Dr. Svalina at the time of the visit.  We reviewed the resident's history and exam and pertinent patient test results.  I agree with the assessment, diagnosis, and plan of care documented in the resident's note.  Alexander Raines, M.D., Ph.D.  

## 2018-07-26 NOTE — Addendum Note (Signed)
Addended by: [name] on: 07/26/2018 02:38 PM   Modules accepted: Orders

## 2018-08-07 LAB — CUP PACEART REMOTE DEVICE CHECK
Implantable Pulse Generator Implant Date: 20190423
MDC IDC SESS DTM: 20190830183524

## 2018-08-10 ENCOUNTER — Other Ambulatory Visit: Payer: Self-pay | Admitting: Internal Medicine

## 2018-08-10 DIAGNOSIS — I10 Essential (primary) hypertension: Secondary | ICD-10-CM

## 2018-08-14 ENCOUNTER — Ambulatory Visit (INDEPENDENT_AMBULATORY_CARE_PROVIDER_SITE_OTHER): Payer: Medicare HMO | Admitting: *Deleted

## 2018-08-14 DIAGNOSIS — I63311 Cerebral infarction due to thrombosis of right middle cerebral artery: Secondary | ICD-10-CM | POA: Diagnosis not present

## 2018-08-15 LAB — CUP PACEART REMOTE DEVICE CHECK
MDC IDC PG IMPLANT DT: 20190423
MDC IDC SESS DTM: 20191002194019

## 2018-08-15 NOTE — Progress Notes (Signed)
Carelink Summary Report / Loop Recorder 

## 2018-08-23 ENCOUNTER — Encounter: Payer: Self-pay | Admitting: Adult Health

## 2018-08-23 ENCOUNTER — Ambulatory Visit (INDEPENDENT_AMBULATORY_CARE_PROVIDER_SITE_OTHER): Payer: Medicare HMO | Admitting: Adult Health

## 2018-08-23 VITALS — BP 136/66 | HR 81 | Ht 62.0 in | Wt 162.0 lb

## 2018-08-23 DIAGNOSIS — N183 Chronic kidney disease, stage 3 unspecified: Secondary | ICD-10-CM

## 2018-08-23 DIAGNOSIS — E782 Mixed hyperlipidemia: Secondary | ICD-10-CM

## 2018-08-23 DIAGNOSIS — I10 Essential (primary) hypertension: Secondary | ICD-10-CM

## 2018-08-23 DIAGNOSIS — I63411 Cerebral infarction due to embolism of right middle cerebral artery: Secondary | ICD-10-CM | POA: Diagnosis not present

## 2018-08-23 DIAGNOSIS — E1122 Type 2 diabetes mellitus with diabetic chronic kidney disease: Secondary | ICD-10-CM | POA: Diagnosis not present

## 2018-08-23 NOTE — Patient Instructions (Signed)
Continue clopidogrel 75 mg daily and Platel  and Lopid  for secondary stroke prevention  Continue to follow up with PCP regarding cholesterol, diabetes and blood pressure management   Continue to stay active and maintain a healthy diet  Good luck with job hunting!! Overall, finding a job will be very good for you!!  Continue to monitor blood pressure at home  Maintain strict control of hypertension with blood pressure goal below 130/90, diabetes with hemoglobin A1c goal below 6.5% and cholesterol with LDL cholesterol (bad cholesterol) goal below 70 mg/dL. I also advised the patient to eat a healthy diet with plenty of whole grains, cereals, fruits and vegetables, exercise regularly and maintain ideal body weight.  Followup in the future with me in 6 months or call earlier if needed       Thank you for coming to see [place] at [facility]. I hope we have been able to provide you high quality care today.  You may receive a patient satisfaction survey over the next few weeks. We would appreciate your feedback and comments so that we may continue to improve ourselves and the health of our patients.

## 2018-08-23 NOTE — Progress Notes (Signed)
I agree with the above plan 

## 2018-08-23 NOTE — Progress Notes (Signed)
[facility] [address] [place]. [place] [address] [phone]       OFFICE FOLLOW UP NOTE  [name] Date of Birth:  1951/08/27 Medical Record Number:  [id number]   Reason for Referral:  hospital stroke follow up  CHIEF COMPLAINT:  Chief Complaint  Patient presents with  . Follow-up    Follow  up for stroke room 9 pt alone    HPI: [name] is being seen today for initial visit in the office for subacute right mid corona radiata infarct and frontal cortical on 02/15/18. History obtained from patient and chart review. Reviewed all radiology images and labs personally.  [name] is a 67 y.o. female with history of peripheral vascular disease, stroke x2affecting the left side with no residual deficits, hypertension, diabetes, hyperlipidemia who presented with left-sided weakness, simple partial seizures, and a new headache. She did not receive IV t-PA due to late presentation.  CT had reviewed and was suggestive of acute infarct in the right periventricular insular region.  MRA head reviewed and showed subcentimeter acute/early subacute infarction right mid corona radiata and a few adjacent punctate foci with multiple old infarcts.  MRA of head showed motion degraded but otherwise unremarkable.  Carotid Doppler showed bilateral 1 to 39% ICA plaquing along with the VA antegrade.  2D echo showed an EF of 60 to 65%.  LDL 87 and as patient is intolerant to statins is recommended to continue Lopid.  A1c 8.9 and recommended tight glycemic control and close PCP follow-up at discharge.  Patient was previously on Plavix and Pletal due to right CC stroke in 2012.  It was recommended to add aspirin 325 for 3 months and then discontinue aspirin and continue on both Plavix and Pletal. patient was having multiple episodes of left eye gaze, left head turning, left arm tonic flexion at home along with more episodes of left arm shaking with hospitalist during  hospitalization.  No loss of consciousness and able to converse well during episodes.  Patient did have EEG long-term for continued possible seizure activity but this was negative and Keppra, Depakote, and Vimpat were stopped.  As EEG was negative for seizure activity, symptoms looked at as left upper extremity spasm and baclofen was started.  Patient was recommended to be discharged to [facility] for continued therapies.   04/23/2018 visit: Patient is being seen today for hospital follow-up.  She has been doing well with only mild left-sided weakness but denies upper extremity jerking or headaches.  Patient has completed both PT and OT therapies.  She has returned to all previous activities.  Patient was not discharged on baclofen but denies any jerking activity.  Patient continues aspirin, Pletal, and Plavix without side effects of bleeding or bruising.  Patient continues Lopid for cholesterol management.  Blood pressure today satisfactory 137/64.  States glucose levels have been well in control between 90-100.  Denies new or worsening stroke/TIA symptoms.  Interval history 08/23/2018: Patient is being seen today for scheduled follow-up visit.  Overall she states she is been doing well from a stroke standpoint without residual deficits or recurring of symptoms.  She does endorse she has occasional mild headaches but states they are not bad enough to take any medication for such as Tylenol.  She continues to take Pletal and Plavix without side effects of bleeding or bruising.  Continues to take Lopid for HLD management.  Recent A1c 6.9 and states her glucose levels at home have been stable.  Blood pressure today satisfactory 136/66.  She does state that she is in the process currently of trying to find a job as she needs additional money to have her truck fixed.  She is also looking forward to possibly obtaining a job as this will help increase her activity level in hopes of losing weight and continuing to lower her  A1c level.  No further concerns at today's appointment.  Denies new or worsening stroke/TIA symptoms.     ROS:   14 system review of systems performed and negative with exception of headache  PMH:  Past Medical History:  Diagnosis Date  . Anemia   . Arteriosclerotic cardiovascular disease   . CKD (chronic kidney disease) stage 3, GFR 30-59 ml/min (HCC)   . Claudication in peripheral vascular disease ([place])   . CVA (cerebral vascular accident) ([place])    2012 and 2019-affected left side + dysarthria 2/2 severe R ACA and MCA stenosis  . GERD (gastroesophageal reflux disease)   . Hyperlipidemia    declines statins  . Hypertension   . Hypothyroidism   . Type 2 diabetes mellitus ([place]) 2000  . Vitamin D deficiency     PSH:  Past Surgical History:  Procedure Laterality Date  . CESAREAN SECTION    . LEFT HEART CATHETERIZATION WITH CORONARY ANGIOGRAM N/A 03/08/2012   Procedure: LEFT HEART CATHETERIZATION WITH CORONARY ANGIOGRAM;  Surgeon: Sinclair Grooms, MD;  Location: [facility] CATH LAB;  Service: Cardiovascular;  Laterality: N/A;  . LEFT HEART CATHETERIZATION WITH CORONARY ANGIOGRAM N/A 12/17/2012   Procedure: LEFT HEART CATHETERIZATION WITH CORONARY ANGIOGRAM;  Surgeon: Laverda Page, MD;  Location: [facility] CATH LAB;  Service: Cardiovascular;  Laterality: N/A;  . LOOP RECORDER INSERTION N/A 03/05/2018   Procedure: LOOP RECORDER INSERTION;  Surgeon: Evans Lance, MD;  Location: [facility] CV LAB;  Service: Cardiovascular;  Laterality: N/A;  . LOWER EXTREMITY ANGIOGRAM N/A 12/17/2012   Procedure: LOWER EXTREMITY ANGIOGRAM;  Surgeon: Laverda Page, MD;  Location: [facility] CATH LAB;  Service: Cardiovascular;  Laterality: N/A;  . TEE WITHOUT CARDIOVERSION N/A 02/28/2018   Procedure: TRANSESOPHAGEAL ECHOCARDIOGRAM (TEE);  Surgeon: Jerline Pain, MD;  Location[facility] ENDOSCOPY;  Service: Cardiovascular;  Laterality: N/A;  . TUBAL LIGATION      Social History:  Social History   Socioeconomic History   . Marital status: Single    Spouse name: Not on file  . Number of children: 1  . Years of education: 50th- [place]  . Highest education level: Not on file  Occupational History  . Occupation: Retired  Scientific laboratory technician  . Financial resource strain: Not on file  . Food insecurity:    Worry: Not on file    Inability: Not on file  . Transportation needs:    Medical: Not on file    Non-medical: Not on file  Tobacco Use  . Smoking status: Former Smoker    Last attempt to quit: 02/04/2011    Years since quitting: 7.5  . Smokeless tobacco: Never Used  Substance and Sexual Activity  . Alcohol use: No    Alcohol/week: 0.0 standard drinks  . Drug use: No  . Sexual activity: Not on file  Lifestyle  . Physical activity:    Days per week: Not on file    Minutes per session: Not on file  . Stress: Not on file  Relationships  . Social connections:    Talks on phone: Not on file    Gets together: Not on file  Attends religious service: Not on file    Active member of club or organization: Not on file    Attends meetings of clubs or organizations: Not on file    Relationship status: Not on file  . Intimate partner violence:    Fear of current or ex partner: Not on file    Emotionally abused: Not on file    Physically abused: Not on file    Forced sexual activity: Not on file  Other Topics Concern  . Not on file  Social History Narrative  . Not on file    Family History:  Family History  Problem Relation Age of Onset  . Diabetes Brother   . Prostate cancer Brother   . Diabetes Mother   . Diabetes Father   . Diabetes Sister   . Esophageal cancer Neg Hx   . Rectal cancer Neg Hx   . Stomach cancer Neg Hx   . Colon cancer Neg Hx   . Breast cancer Neg Hx     Medications:   Current Outpatient Medications on File Prior to Visit  Medication Sig Dispense Refill  . BD INSULIN SYRINGE ULTRAFINE 31G X 15/64" 0.5 ML MISC USE TO INJECT INSULIN TWO TIMES A DAY 100 each 11  .  Cholecalciferol (VITAMIN D-3) 1000 units CAPS Take 1 capsule (1,000 Units total) by mouth daily. 60 capsule 1  . cilostazol (PLETAL) 100 MG tablet Take 1 tablet (100 mg total) by mouth 2 (two) times daily. 60 tablet 5  . clopidogrel (PLAVIX) 75 MG tablet TAKE 1 TABLET BY MOUTH EVERY DAY IN THE MORNING 90 tablet 3  . gemfibrozil (LOPID) 600 MG tablet Take 1 tablet (600 mg total) by mouth 2 (two) times daily. 180 tablet 0  . glucose blood (ACCU-CHEK AVIVA PLUS) test strip Check sugars twice a day. The patient is insulin requiring, ICD 10 code E11.9. The patient tests 2 times per day. 100 each 2  . hydrochlorothiazide (HYDRODIURIL) 12.5 MG tablet TAKE 1 TABLET BY MOUTH EVERY DAY 30 tablet 2  . Insulin Isophane & Regular Human (NOVOLIN 70/30 FLEXPEN) (70-30) 100 UNIT/ML PEN Inject 30 Units into the skin daily. 15 mL 8  . Insulin Pen Needle (B-D UF III MINI PEN NEEDLES) 31G X 5 MM MISC INJECTS INSULIN 2 TIMES PER DAY. 100 each 2  . levothyroxine (SYNTHROID, LEVOTHROID) 88 MCG tablet TAKE 1 TABLET BY MOUTH EVERY DAY BEFORE BREAKFAST 90 tablet 3  . liraglutide (VICTOZA) 18 MG/3ML SOPN Inject 0.2 mLs (1.2 mg total) into the skin daily. 6 mL 3  . losartan (COZAAR) 100 MG tablet Take 1 tablet (100 mg total) by mouth daily. 90 tablet 3  . Multiple Vitamins-Minerals (MULTIVITAMIN ADULT PO) Take 1 tablet by mouth once a week. On Wednesday    . ranitidine (ZANTAC) 150 MG tablet Take 1 tablet (150 mg total) by mouth daily as needed for heartburn. 60 tablet 1   No current facility-administered medications on file prior to visit.     Allergies:   Allergies  Allergen Reactions  . Dilantin [Phenytoin Sodium Extended]     Rash/itching  . Insulins Rash    Humalog 75/25 only  . Statins Nausea Only and Rash    "sick to my stomach"; describes "rash and a lump" while taking statins     Physical Exam  Vitals:   08/23/18 1121  BP: 136/66  Pulse: 81  Weight: 162 lb (73.5 kg)  Height: 5\' 2"  (1.575 m)   Body  mass  index is 29.63 kg/m. No exam data present  General: well developed, pleasant African-American female, well nourished, seated, in no evident distress Head: head normocephalic and atraumatic.   Neck: supple with no carotid or supraclavicular bruits Cardiovascular: regular rate and rhythm, no murmurs Musculoskeletal: no deformity Skin:  no rash/petichiae Vascular:  Normal pulses all extremities  Neurologic Exam Mental Status: Awake and fully alert. Oriented to place and time. Recent and remote memory intact. Attention span, concentration and fund of knowledge appropriate. Mood and affect appropriate.  Cranial Nerves: Fundoscopic exam reveals sharp disc margins. Pupils equal, briskly reactive to light. Extraocular movements full without nystagmus. Visual fields full to confrontation. Hearing intact. Facial sensation intact. Face, tongue, palate moves normally and symmetrically.  Motor: Normal bulk and tone. Normal strength in all tested extremity muscles  Sensory.: intact to touch , pinprick , position and vibratory sensation.  Coordination: Rapid alternating movements normal in all extremities. Finger-to-nose and heel-to-shin performed accurately bilaterally.  Mild left hand dexterity with weakness Gait and Station: Arises from chair without difficulty. Stance is normal. Gait demonstrates normal stride length and balance . Able to heel, toe and tandem walk without difficulty.  Reflexes: 1+ and symmetric. Toes downgoing.       Diagnostic Data (Labs, Imaging, Testing)  Ct Head Wo Contrast 02/16/2018 IMPRESSION:  Suggestion of acute infarct in the right periventricular and insular region. Consider MRI correlation if clinically indicated. No acute intracranial hemorrhage or mass effect.   Mr Brain Wo Contrast 02/16/2018 MRI head:  1. Subcentimeter acute/early subacute infarction and right mid corona radiata with few adjacent punctate foci. No associated hemorrhage or mass effect.  2.  Mild chronic microvascular ischemic changes and mild parenchymal volume loss of the brain.  3. Small chronic infarcts in right genu of corpus callosum, right body of corpus callosum, and right hemi pons.  MRA head:  Severe motion artifact. Suboptimal assessment for stenosis or small aneurysm. No large vessel occlusion or large aneurysm identified.   Transthoracic Echocardiogram - Left ventricle: The cavity size was normal. Wall thickness was normal. Systolic function was normal. The estimated ejection fraction was in the range of 60% to 65%. Wall motion was normal; there were no regional wall motion abnormalities. Doppler parameters are consistent with abnormal left ventricular relaxation (grade 1 diastolic dysfunction).  Bilateral Carotid Dopplers  02/16/2018 1-39% ICA plaquing. Vertebral artery flow is antegrade.  EEG This normal EEG is recorded in the waking andsleepstate. There was no seizure or seizure predisposition recorded on this study. Please note that a normal EEG does not preclude the possibility of epilepsy.   LTM EEG day 1 There was no pushbutton activations events during this recording. Automated spike detection program did not detect any spikes. Seizure detection program did not detect any seizures .  Waking background activities were marked by8cps posterior dominant developed on the as opposed to right. There is a persistent right hemispheric delta slowing involving the right frontotemporal region throughout the recording. There is no interictal epileptiform discharges. No clinical or subclinical seizures present. Clinical interpretation: Thisday 1 of intensive EEG monitoring with simultaneous video monitoring did not record any clinical subclinical seizures. EEG was abnormal due to persistent right frontotemporal delta slowing suggestive of neuronal dysfunction on a structural vascular or degenerative basis. Clinical correlation is advised.  LTM EEG  day 2 Day 2:numerous events of interest were recorded marked by difficulty using left arm,some tremulousness particular with intention or action or trying to utilize left arm. Sometimes patients opening and  closing his left fist. Sometimes there is a she is trying to move her fingers.Time there is a more complex left arm movements side to side up and down in a synchronous fashion events appeared to be in waxing and waning fashion sometimes lasting a long time however decreased with rest and increased with again trying to utilize left arm. At times patient also complaining of left face been"drawn "" simultaneously with the left arm movements. With patient is completely awake alert and aware during these spells. At times she is eating and utilizing left arm during that time accompanied by numerous pushbutton events as patient experiencing potentially some difficulties in using the left arm. Electrographically was all these numerous events of interest there is unchanged waking background activity is present prior during and after these events of interest. Semiology of these events of interest are inconsistent with seizures. In addition left facial drawing and asymmetry have volitional component possibly. However left arm movements and difficulty associated with utilizing left arm could be related to other etiologies including underlying left arm weakness itself possibly some movement disorder. Background activities were unchanged from previously recorded in marked by right frontotemporal slowing suggestive of neuronal dysfunction in the right frontotemporal region. Clinical interpretation: This day2of intensive EEG monitoring with simultaneous video monitoring did not record any clinical subclinical seizures. EEG was abnormal due to right frontotemporal delta slowing suggestive of neuronal dysfunction on a structural vascular or degenerative basis.  Numerous events of interest were recorded  involving left arm and left face as discussed above in details. There was no any EEG changes to suggest seizures. However sometimes with simple partial seizures that there is no significant EEG changes. However semiology of this events are inconsistent with simple partial seizures. Again could be related to underlying weakness itself, movement disorder versus some volitional component associated with this events. Clinical correlation is advised.      ASSESSMENT: [name] is a 67 y.o. year old female here with right CR infarct on 02/16/2018 secondary to small vessel disease. Vascular risk factors include PVD, HTN, DM, and HLD.  Patient is being seen today for stroke follow-up and overall has been stable from a stroke standpoint with only very mild residual left upper extremity weakness.    PLAN: -Continue aspirin 325 mg daily, clopidogrel 75 mg daily and Pletal  and Lopid  for secondary stroke prevention -F/u with PCP regarding your HLD, HTN and DM management -continue to monitor BP at home -Congratulated on tremendous improvement on A1c level and encouraged continuation with goal of lowering A1c even further with improvement of diet and increasing activity level -Advised to continue to stay active and maintain a healthy diet -Maintain strict control of hypertension with blood pressure goal below 130/90, diabetes with hemoglobin A1c goal below 6.5% and cholesterol with LDL cholesterol (bad cholesterol) goal below 70 mg/dL. I also advised the patient to eat a healthy diet with plenty of whole grains, cereals, fruits and vegetables, exercise regularly and maintain ideal body weight.  Follow up in 6 months or call earlier if needed   Greater than 50% of time during this 25 minute visit was spent on counseling,explanation of diagnosis of right CR infarct, reviewing risk factor management of HTN, DM HLD, planning of further management, discussion with patient and family and coordination of  care    Venancio Poisson, [facility]  [facility] [address] [place], [place] [address]  Phone [phone] Fax [phone]

## 2018-08-24 ENCOUNTER — Other Ambulatory Visit: Payer: Self-pay | Admitting: Internal Medicine

## 2018-08-24 DIAGNOSIS — N183 Chronic kidney disease, stage 3 unspecified: Secondary | ICD-10-CM

## 2018-08-24 DIAGNOSIS — E1122 Type 2 diabetes mellitus with diabetic chronic kidney disease: Secondary | ICD-10-CM

## 2018-08-31 ENCOUNTER — Other Ambulatory Visit: Payer: Self-pay | Admitting: Internal Medicine

## 2018-09-05 ENCOUNTER — Other Ambulatory Visit: Payer: Self-pay | Admitting: Internal Medicine

## 2018-09-05 NOTE — Telephone Encounter (Signed)
Next appt scheduled 11/13 with PCP. 

## 2018-09-15 ENCOUNTER — Other Ambulatory Visit: Payer: Self-pay | Admitting: Internal Medicine

## 2018-09-15 DIAGNOSIS — I10 Essential (primary) hypertension: Secondary | ICD-10-CM

## 2018-09-16 ENCOUNTER — Ambulatory Visit (INDEPENDENT_AMBULATORY_CARE_PROVIDER_SITE_OTHER): Payer: Medicare HMO | Admitting: *Deleted

## 2018-09-16 DIAGNOSIS — I63311 Cerebral infarction due to thrombosis of right middle cerebral artery: Secondary | ICD-10-CM | POA: Diagnosis not present

## 2018-09-17 DIAGNOSIS — I63311 Cerebral infarction due to thrombosis of right middle cerebral artery: Secondary | ICD-10-CM | POA: Diagnosis not present

## 2018-09-17 NOTE — Progress Notes (Signed)
Carelink Summary Report / Loop Recorder 

## 2018-09-18 MED ORDER — HYDROCHLOROTHIAZIDE 25 MG PO TABS: 25.0000 mg | ORAL_TABLET | Freq: Every day | ORAL | 0 refills | Status: DC

## 2018-09-20 ENCOUNTER — Ambulatory Visit
Admission: RE | Admit: 2018-09-20 | Discharge: 2018-09-20 | Disposition: A | Discharge status: Home / Self Care | Payer: Medicare HMO | Source: Ambulatory Visit | Attending: Internal Medicine | Admitting: Internal Medicine

## 2018-09-20 DIAGNOSIS — Z13820 Encounter for screening for osteoporosis: Secondary | ICD-10-CM | POA: Diagnosis not present

## 2018-09-20 DIAGNOSIS — Z780 Asymptomatic menopausal state: Secondary | ICD-10-CM

## 2018-09-22 ENCOUNTER — Other Ambulatory Visit: Payer: Self-pay | Admitting: Internal Medicine

## 2018-09-22 DIAGNOSIS — E1165 Type 2 diabetes mellitus with hyperglycemia: Secondary | ICD-10-CM

## 2018-09-24 NOTE — Progress Notes (Signed)
   CC: diabetes  HPI:  [name] is a 67 y.o. with a PMH of in 2012and 2019,PVD, T2DM, and HTNpresenting to clinic for follow up on T2DM.  T2DM: Patient endorses compliance victoza 1.0mg  daily and novolin 70/30 25 units every morning. She has noticed her CBGs worsening recently since she stopped getting meals-on-wheels (her decision). She endorses polydipsia and polyuria.   HTN: Patient endorses compliance with HCTZ 25mg  daily and losartan 100mg  daily. She denies chest pain, shortness of breath, LE swelling.   Please see problem based Assessment and Plan for status of patients chronic conditions.  Past Medical History:  Diagnosis Date  . Anemia   . Arteriosclerotic cardiovascular disease   . CKD (chronic kidney disease) stage 3, GFR 30-59 ml/min (HCC)   . Claudication in peripheral vascular disease ([place])   . CVA (cerebral vascular accident) ([place])    2012 and 2019-affected left side + dysarthria 2/2 severe R ACA and MCA stenosis  . GERD (gastroesophageal reflux disease)   . Hyperlipidemia    declines statins  . Hypertension   . Hypothyroidism   . Type 2 diabetes mellitus ([place]) 2000  . Vitamin D deficiency     Review of Systems:   Per HPI  Physical Exam:  Vitals:   09/25/18 1502  BP: 139/60  Pulse: 79  Temp: 97.8 F (36.6 C)  TempSrc: Oral  SpO2: 100%  Weight: 164 lb 9.6 oz (74.7 kg)  Height: 5\' 2"  (1.575 m)   GENERAL- alert, co-operative, appears as stated age, not in any distress. CARDIAC- RRR, no murmurs, rubs or gallops. RESP- Moving equal volumes of air, and clear to auscultation bilaterally, no wheezes or crackles. ABDOMEN- Soft, nontender. EXTREMITIES- pulse 2+ DP/PT, symmetric, no pedal edema. SKIN- Dry  Assessment & Plan:   See Encounters Tab for problem based charting.   Patient discussed with Dr. Andris Baumann, MD Internal Medicine PGY-3

## 2018-09-25 ENCOUNTER — Encounter: Payer: Self-pay | Admitting: Internal Medicine

## 2018-09-25 ENCOUNTER — Other Ambulatory Visit: Payer: Self-pay

## 2018-09-25 ENCOUNTER — Ambulatory Visit (INDEPENDENT_AMBULATORY_CARE_PROVIDER_SITE_OTHER): Payer: Medicare HMO | Admitting: Internal Medicine

## 2018-09-25 VITALS — BP 139/60 | HR 79 | Temp 97.8°F | Ht 62.0 in | Wt 164.6 lb

## 2018-09-25 DIAGNOSIS — I10 Essential (primary) hypertension: Secondary | ICD-10-CM

## 2018-09-25 DIAGNOSIS — E1122 Type 2 diabetes mellitus with diabetic chronic kidney disease: Secondary | ICD-10-CM | POA: Diagnosis not present

## 2018-09-25 DIAGNOSIS — Z794 Long term (current) use of insulin: Secondary | ICD-10-CM | POA: Diagnosis not present

## 2018-09-25 DIAGNOSIS — Z79899 Other long term (current) drug therapy: Secondary | ICD-10-CM

## 2018-09-25 DIAGNOSIS — E1151 Type 2 diabetes mellitus with diabetic peripheral angiopathy without gangrene: Secondary | ICD-10-CM

## 2018-09-25 DIAGNOSIS — N183 Chronic kidney disease, stage 3 unspecified: Secondary | ICD-10-CM

## 2018-09-25 DIAGNOSIS — I129 Hypertensive chronic kidney disease with stage 1 through stage 4 chronic kidney disease, or unspecified chronic kidney disease: Secondary | ICD-10-CM | POA: Diagnosis not present

## 2018-09-25 DIAGNOSIS — E559 Vitamin D deficiency, unspecified: Secondary | ICD-10-CM | POA: Diagnosis not present

## 2018-09-25 DIAGNOSIS — Z8673 Personal history of transient ischemic attack (TIA), and cerebral infarction without residual deficits: Secondary | ICD-10-CM

## 2018-09-25 DIAGNOSIS — I714 Abdominal aortic aneurysm, without rupture, unspecified: Secondary | ICD-10-CM

## 2018-09-25 DIAGNOSIS — Z0000 Encounter for general adult medical examination without abnormal findings: Secondary | ICD-10-CM

## 2018-09-25 LAB — POCT GLYCOSYLATED HEMOGLOBIN (HGB A1C): Hemoglobin A1C: 7.7 % — AB (ref 4.0–5.6)

## 2018-09-25 LAB — GLUCOSE, CAPILLARY: Glucose-Capillary: 157 mg/dL — ABNORMAL HIGH (ref 70–99)

## 2018-09-25 NOTE — Patient Instructions (Signed)
Lets work on your diet to help better control your sugars.   We will get an ultrasound of your aorta (main blood vessel in your stomach) to make sure it is not getting bigger.

## 2018-09-26 LAB — VITAMIN D 25 HYDROXY (VIT D DEFICIENCY, FRACTURES): Vit D, 25-Hydroxy: 41.7 ng/mL (ref 30.0–100.0)

## 2018-09-27 ENCOUNTER — Encounter: Payer: Self-pay | Admitting: Internal Medicine

## 2018-09-27 NOTE — Assessment & Plan Note (Addendum)
Worsening control due to dietary changes (A1c 6.9>7.7).   Plan: --patient declines nutrition consult for now --we discussed working on diet changes again including resuming meals on wheels --patient declined medication adjustment for now; discussed that at next visit if A1c not improved, we need to consider changing her regimen --continue victoza 1.0mg  daily (patient preference, endorses peripheral neuropathy ONLY when dose increased past this) --continue novolin 70/30 25 units daily --foot exam performed today --on ARB --declined statin --f/u in 3 months

## 2018-09-27 NOTE — Assessment & Plan Note (Signed)
Vit D level wnl. Continue current dose.

## 2018-09-27 NOTE — Assessment & Plan Note (Signed)
Stable and asymptomatic.  Plan: --continue HCTZ 25mg  daily and losartan 100mg  daily

## 2018-09-27 NOTE — Assessment & Plan Note (Signed)
AAA US ordered.

## 2018-09-27 NOTE — Assessment & Plan Note (Signed)
Dexa scan performed since last visit; T score 0.9 which is wnl. Can consider repeating Dexa in 10-15 years.

## 2018-10-01 NOTE — Progress Notes (Signed)
Case discussed with Dr. Svalina at the time of the visit. We reviewed the resident's history and exam and pertinent patient test results. I agree with the assessment, diagnosis, and plan of care documented in the resident's note. 

## 2018-10-21 ENCOUNTER — Ambulatory Visit (INDEPENDENT_AMBULATORY_CARE_PROVIDER_SITE_OTHER): Payer: Medicare HMO

## 2018-10-21 DIAGNOSIS — I63311 Cerebral infarction due to thrombosis of right middle cerebral artery: Secondary | ICD-10-CM

## 2018-10-22 NOTE — Progress Notes (Signed)
Carelink Summary Report / Loop Recorder 

## 2018-10-25 ENCOUNTER — Telehealth: Payer: Self-pay | Admitting: *Deleted

## 2018-10-25 ENCOUNTER — Other Ambulatory Visit: Payer: Self-pay | Admitting: Internal Medicine

## 2018-10-25 DIAGNOSIS — R5383 Other fatigue: Secondary | ICD-10-CM

## 2018-10-25 NOTE — Telephone Encounter (Signed)
Spoke to patient on the phone regarding starting statin. She states that no-one from [facility] has spoken to her about this medicine.  As we had in the past, I discussed her indication for being on a statin like lipitor or crestor to help decrease her CV risk, especially in setting of her prior CVAs. She notes previous myalgias with lipitor. She declines starting a statin at this time, mainly due to pill burden, however at this time, I don't see any meds that can be stopped. She states she will think about starting a statin and we will continue addressing this in future appointments.  [name], MD IMTS - PGY3

## 2018-10-25 NOTE — Telephone Encounter (Signed)
Received fax from [facility] stating, "We spoke to your patient about diabetes care and noticed your patient has not filled a statin in the last 180 days. Your patient would like us to reach out on their behalf to determine if it is appropriate to start a statin therapy. Please send a new prescription for statin therapy if it is appropriate." L. [place], RN, BSN    

## 2018-10-29 ENCOUNTER — Ambulatory Visit (HOSPITAL_COMMUNITY): Admission: RE | Admit: 2018-10-29 | Payer: Medicare HMO | Source: Ambulatory Visit

## 2018-11-09 LAB — CUP PACEART REMOTE DEVICE CHECK
Date Time Interrogation Session: 20191104194048
MDC IDC PG IMPLANT DT: 20190423

## 2018-11-21 ENCOUNTER — Ambulatory Visit (INDEPENDENT_AMBULATORY_CARE_PROVIDER_SITE_OTHER): Payer: Medicare HMO

## 2018-11-21 ENCOUNTER — Other Ambulatory Visit: Payer: Self-pay | Admitting: Internal Medicine

## 2018-11-21 DIAGNOSIS — I63311 Cerebral infarction due to thrombosis of right middle cerebral artery: Secondary | ICD-10-CM | POA: Diagnosis not present

## 2018-11-21 DIAGNOSIS — K219 Gastro-esophageal reflux disease without esophagitis: Secondary | ICD-10-CM

## 2018-11-21 MED ORDER — FAMOTIDINE 20 MG PO TABS: 20.0000 mg | ORAL_TABLET | Freq: Every day | ORAL | 1 refills | Status: DC | PRN

## 2018-11-22 NOTE — Progress Notes (Signed)
Carelink Summary Report / Loop Recorder 

## 2018-11-23 LAB — CUP PACEART REMOTE DEVICE CHECK
Implantable Pulse Generator Implant Date: 20190423
MDC IDC SESS DTM: 20200109203613

## 2018-12-01 LAB — CUP PACEART REMOTE DEVICE CHECK
Date Time Interrogation Session: 20191207203904
MDC IDC PG IMPLANT DT: 20190423

## 2018-12-04 ENCOUNTER — Telehealth: Payer: Self-pay | Admitting: Dietician

## 2018-12-12 NOTE — Telephone Encounter (Signed)
Ms. Chittum is not interested in having Continuous glucose monitoring done again a this time. She denies any need for Diabetes Self Management Training  or Medical Nutrition Therapy or to be contacted for support by phone.

## 2018-12-12 NOTE — Telephone Encounter (Signed)
Thank you :)

## 2018-12-14 ENCOUNTER — Other Ambulatory Visit: Payer: Self-pay | Admitting: Internal Medicine

## 2018-12-16 NOTE — Telephone Encounter (Signed)
Next appt scheduled 2/19 with PCP.

## 2018-12-17 ENCOUNTER — Other Ambulatory Visit: Payer: Self-pay | Admitting: Internal Medicine

## 2018-12-17 DIAGNOSIS — R69 Illness, unspecified: Secondary | ICD-10-CM | POA: Diagnosis not present

## 2018-12-17 NOTE — Telephone Encounter (Signed)
Next appt scheduled 2/19 with PCP.

## 2018-12-23 ENCOUNTER — Other Ambulatory Visit: Payer: Self-pay | Admitting: *Deleted

## 2018-12-23 DIAGNOSIS — N183 Chronic kidney disease, stage 3 unspecified: Secondary | ICD-10-CM

## 2018-12-23 DIAGNOSIS — E1122 Type 2 diabetes mellitus with diabetic chronic kidney disease: Secondary | ICD-10-CM

## 2018-12-23 MED ORDER — INSULIN PEN NEEDLE 31G X 5 MM MISC: 2 refills | Status: DC

## 2018-12-24 ENCOUNTER — Ambulatory Visit (INDEPENDENT_AMBULATORY_CARE_PROVIDER_SITE_OTHER): Payer: Medicare HMO

## 2018-12-24 DIAGNOSIS — I63311 Cerebral infarction due to thrombosis of right middle cerebral artery: Secondary | ICD-10-CM | POA: Diagnosis not present

## 2018-12-24 LAB — CUP PACEART REMOTE DEVICE CHECK
Date Time Interrogation Session: 20200211182154
Implantable Pulse Generator Implant Date: 20190423

## 2018-12-25 ENCOUNTER — Encounter: Payer: Medicare HMO | Admitting: Internal Medicine

## 2018-12-27 ENCOUNTER — Telehealth: Payer: Self-pay

## 2018-12-27 NOTE — Telephone Encounter (Signed)
Left message for patient regarding disconnected monitor.  

## 2018-12-31 NOTE — Progress Notes (Signed)
   CC: T2DM  HPI:  Ms.Chelsea D Rew is a 68 y.o. with a PMH of h/o CVA in 2012 and 2019, T2DM, CKD 3, PVD, HTN, HLD presenting to clinic for follow up on T2DM.  T2DM: Patient with A1c trending up last visit after a hospitalization and medication change; A1c 6.9>7.7. Since then she has gone back to her prior regimen of victoza 1.0mg  (per patient preference) with novolin 70/30 25 units daily. She states her sugars have been more uncontrolled recently; she attributes this to limited food options with her poor dentition - she is following with a dentist and is hoping to get dentures in the next couple of months. She denies associated polyuria, polydipsia, polyphagia.  HTN: Patient on losartan 100mg  daily and hctz 25mg  daily. She denies chest pain, shortness of breath, headaches, vision or hearing changes, new weakness.  Please see problem based Assessment and Plan for status of patients chronic conditions.  Past Medical History:  Diagnosis Date  . Anemia   . Arteriosclerotic cardiovascular disease   . CKD (chronic kidney disease) stage 3, GFR 30-59 ml/min (HCC)   . Claudication in peripheral vascular disease ([place])   . CVA (cerebral vascular accident) ([place])    2012 and 2019-affected left side + dysarthria 2/2 severe R ACA and MCA stenosis  . GERD (gastroesophageal reflux disease)   . Hyperlipidemia    declines statins  . Hypertension   . Hypothyroidism   . Type 2 diabetes mellitus ([place]) 2000  . Vitamin D deficiency     Review of Systems:   Per HPI  Physical Exam:  Vitals:   01/01/19 1318 01/01/19 1358  BP: (!) 158/54 128/78  Pulse: 87   Temp: 97.9 F (36.6 C)   TempSrc: Oral   SpO2: 98%   Weight: 164 lb 8 oz (74.6 kg)   Height: 5\' 2"  (1.575 m)    GENERAL- alert, co-operative, appears as stated age, not in any distress. CARDIAC- RRR, no murmurs, rubs or gallops. RESP- Moving equal volumes of air, and clear to auscultation bilaterally, no wheezes or crackles. ABDOMEN-  Soft, nontender, bowel sounds present. NEURO- CN 2-12 grossly intact. EXTREMITIES- pulse 1+ PT, symmetric, no pedal edema. SKIN- Warm, dry, no rash or lesion. PSYCH- Normal mood and affect, appropriate thought content and speech.  Assessment & Plan:   See Encounters Tab for problem based charting.   Patient discussed with Dr. Gerrit Friends, MD Internal Medicine PGY-3

## 2019-01-01 ENCOUNTER — Ambulatory Visit (INDEPENDENT_AMBULATORY_CARE_PROVIDER_SITE_OTHER): Payer: Medicare HMO | Admitting: Internal Medicine

## 2019-01-01 ENCOUNTER — Encounter: Payer: Self-pay | Admitting: Internal Medicine

## 2019-01-01 ENCOUNTER — Other Ambulatory Visit: Payer: Self-pay

## 2019-01-01 VITALS — BP 128/78 | HR 87 | Temp 97.9°F | Ht 62.0 in | Wt 164.5 lb

## 2019-01-01 DIAGNOSIS — I714 Abdominal aortic aneurysm, without rupture, unspecified: Secondary | ICD-10-CM

## 2019-01-01 DIAGNOSIS — K0889 Other specified disorders of teeth and supporting structures: Secondary | ICD-10-CM

## 2019-01-01 DIAGNOSIS — I129 Hypertensive chronic kidney disease with stage 1 through stage 4 chronic kidney disease, or unspecified chronic kidney disease: Secondary | ICD-10-CM | POA: Diagnosis not present

## 2019-01-01 DIAGNOSIS — E1122 Type 2 diabetes mellitus with diabetic chronic kidney disease: Secondary | ICD-10-CM | POA: Diagnosis not present

## 2019-01-01 DIAGNOSIS — E785 Hyperlipidemia, unspecified: Secondary | ICD-10-CM | POA: Diagnosis not present

## 2019-01-01 DIAGNOSIS — N183 Chronic kidney disease, stage 3 unspecified: Secondary | ICD-10-CM

## 2019-01-01 DIAGNOSIS — Z794 Long term (current) use of insulin: Secondary | ICD-10-CM

## 2019-01-01 DIAGNOSIS — Z8673 Personal history of transient ischemic attack (TIA), and cerebral infarction without residual deficits: Secondary | ICD-10-CM

## 2019-01-01 DIAGNOSIS — I10 Essential (primary) hypertension: Secondary | ICD-10-CM

## 2019-01-01 DIAGNOSIS — E782 Mixed hyperlipidemia: Secondary | ICD-10-CM

## 2019-01-01 DIAGNOSIS — E1151 Type 2 diabetes mellitus with diabetic peripheral angiopathy without gangrene: Secondary | ICD-10-CM

## 2019-01-01 DIAGNOSIS — Z79899 Other long term (current) drug therapy: Secondary | ICD-10-CM

## 2019-01-01 LAB — POCT GLYCOSYLATED HEMOGLOBIN (HGB A1C): Hemoglobin A1C: 9.5 % — AB (ref 4.0–5.6)

## 2019-01-01 LAB — GLUCOSE, CAPILLARY: Glucose-Capillary: 150 mg/dL — ABNORMAL HIGH (ref 70–99)

## 2019-01-01 MED ORDER — LOSARTAN POTASSIUM-HCTZ 100-25 MG PO TABS: 1.0000 | ORAL_TABLET | Freq: Every day | ORAL | 1 refills | Status: DC

## 2019-01-01 MED ORDER — INSULIN DEGLUDEC-LIRAGLUTIDE 100-3.6 UNIT-MG/ML ~~LOC~~ SOPN: 16.0000 [IU] | PEN_INJECTOR | Freq: Every day | SUBCUTANEOUS | 1 refills | Status: DC

## 2019-01-01 NOTE — Patient Instructions (Signed)
For your diabetes: Start Xultophy 16 units injection every night; stop the novolin and victoza once you start this If your morning glucose is overall above 140, after 1 week you can increase the Xultophy to 18 units Keep increasing weekly by 2 units if your sugars remain above 140. Schedule an appointment with your eye doctor We'll refer you to a foot doctor  For your blood pressure: I sent in a new script that has both losartan and hydrochlorothizide in 1 pill.

## 2019-01-02 LAB — BMP8+ANION GAP
Anion Gap: 19 mmol/L — ABNORMAL HIGH (ref 10.0–18.0)
BUN / CREAT RATIO: 19 (ref 12–28)
BUN: 33 mg/dL — ABNORMAL HIGH (ref 8–27)
CO2: 20 mmol/L (ref 20–29)
Calcium: 10.1 mg/dL (ref 8.7–10.3)
Chloride: 101 mmol/L (ref 96–106)
Creatinine, Ser: 1.7 mg/dL — ABNORMAL HIGH (ref 0.57–1.00)
GFR calc Af Amer: 36 mL/min/{1.73_m2} — ABNORMAL LOW (ref >59)
GFR calc non Af Amer: 31 mL/min/{1.73_m2} — ABNORMAL LOW (ref >59)
Glucose: 166 mg/dL — ABNORMAL HIGH (ref 65–99)
Potassium: 4.1 mmol/L (ref 3.5–5.2)
Sodium: 140 mmol/L (ref 134–144)

## 2019-01-02 NOTE — Assessment & Plan Note (Addendum)
Worsened control with A1c uptrending from 6.9>7.7>9.3 today - due to diet which is limited at this time in setting of poor dentition.  Patient in agreement today to try switching to xultophy.  Plan: --start xultophy 16 units qhs and uptitrate every week by 2 units if general trend of fasting CBGs >140 --stop novolin and victoza --she was reminded to make an appointment with her ophthalmologist for yearly exam --on ARB; declines statin however will continue addressing at future visits --she wants a podiatry referral for thick tonails --f/u in 1 month with glucometer; with her renal function, she is not a candidate for metformin, SGLT2I; she was previously on pioglitazone, which we can consider starting in the future

## 2019-01-02 NOTE — Assessment & Plan Note (Signed)
Slight worsening of renal function with Cr 1.7 (~1.5 previously), and eGFR 36. Will repeat at f/u. 

## 2019-01-02 NOTE — Assessment & Plan Note (Addendum)
We are working on decreasing amount of pills she is taking as she noted previously she might be interested in trying statins again if she can get the number of pills she is taking lower. Today we combined her losartan with HCTZ into one pill and also are consolidating her GLP 1 and long acting insulin into 1 injection. At follow up will discuss starting statin - she would greatly benefit as she has already had 2 strokes, among her other indications and risk factors.

## 2019-01-02 NOTE — Assessment & Plan Note (Signed)
Patient missed appointment for U/S - our office will try to reschedule this

## 2019-01-02 NOTE — Progress Notes (Signed)
Internal Medicine Clinic Attending  Case discussed with Dr. Svalina  at the time of the visit.  We reviewed the resident's history and exam and pertinent patient test results.  I agree with the assessment, diagnosis, and plan of care documented in the resident's note.  

## 2019-01-02 NOTE — Assessment & Plan Note (Signed)
Well controlled and asymptomatic.  Plan: --continue losartan 172m daily and hctz 232mdaily --Bmet today reveals slight worsening of her renal function with Cr up to 1.7, eGFR 36 from 42 - will repeat at f/u in 1 month

## 2019-01-06 NOTE — Progress Notes (Signed)
Carelink Summary Report / Loop Recorder 

## 2019-01-14 ENCOUNTER — Encounter: Payer: Self-pay | Admitting: *Deleted

## 2019-01-16 ENCOUNTER — Other Ambulatory Visit: Payer: Self-pay | Admitting: Internal Medicine

## 2019-01-16 ENCOUNTER — Ambulatory Visit (HOSPITAL_COMMUNITY)
Admission: RE | Admit: 2019-01-16 | Discharge: 2019-01-16 | Disposition: A | Discharge status: Home / Self Care | Payer: Medicare HMO | Source: Ambulatory Visit | Attending: Internal Medicine | Admitting: Internal Medicine

## 2019-01-16 DIAGNOSIS — I714 Abdominal aortic aneurysm, without rupture, unspecified: Secondary | ICD-10-CM

## 2019-01-16 DIAGNOSIS — I639 Cerebral infarction, unspecified: Secondary | ICD-10-CM | POA: Diagnosis not present

## 2019-01-16 DIAGNOSIS — I10 Essential (primary) hypertension: Secondary | ICD-10-CM | POA: Diagnosis not present

## 2019-01-26 ENCOUNTER — Other Ambulatory Visit: Payer: Self-pay | Admitting: Internal Medicine

## 2019-01-26 DIAGNOSIS — I739 Peripheral vascular disease, unspecified: Secondary | ICD-10-CM

## 2019-01-27 ENCOUNTER — Ambulatory Visit (INDEPENDENT_AMBULATORY_CARE_PROVIDER_SITE_OTHER): Payer: Medicare HMO | Admitting: *Deleted

## 2019-01-27 ENCOUNTER — Other Ambulatory Visit: Payer: Self-pay

## 2019-01-27 DIAGNOSIS — I63311 Cerebral infarction due to thrombosis of right middle cerebral artery: Secondary | ICD-10-CM | POA: Diagnosis not present

## 2019-01-28 LAB — CUP PACEART REMOTE DEVICE CHECK
Date Time Interrogation Session: 20200315214022
Implantable Pulse Generator Implant Date: 20190423

## 2019-02-04 NOTE — Progress Notes (Signed)
Carelink Summary Report / Loop Recorder 

## 2019-02-19 ENCOUNTER — Other Ambulatory Visit: Payer: Self-pay

## 2019-02-19 ENCOUNTER — Other Ambulatory Visit: Payer: Self-pay | Admitting: Internal Medicine

## 2019-02-19 ENCOUNTER — Ambulatory Visit (INDEPENDENT_AMBULATORY_CARE_PROVIDER_SITE_OTHER): Payer: Medicare HMO | Admitting: Adult Health

## 2019-02-19 ENCOUNTER — Encounter: Payer: Self-pay | Admitting: Adult Health

## 2019-02-19 DIAGNOSIS — E1165 Type 2 diabetes mellitus with hyperglycemia: Secondary | ICD-10-CM

## 2019-02-19 DIAGNOSIS — Z8673 Personal history of transient ischemic attack (TIA), and cerebral infarction without residual deficits: Secondary | ICD-10-CM | POA: Diagnosis not present

## 2019-02-19 DIAGNOSIS — N183 Chronic kidney disease, stage 3 unspecified: Secondary | ICD-10-CM

## 2019-02-19 DIAGNOSIS — I10 Essential (primary) hypertension: Secondary | ICD-10-CM | POA: Diagnosis not present

## 2019-02-19 DIAGNOSIS — E782 Mixed hyperlipidemia: Secondary | ICD-10-CM

## 2019-02-19 DIAGNOSIS — E1122 Type 2 diabetes mellitus with diabetic chronic kidney disease: Secondary | ICD-10-CM

## 2019-02-19 MED ORDER — ESCITALOPRAM OXALATE 5 MG PO TABS: 5.0000 mg | ORAL_TABLET | Freq: Every day | ORAL | 2 refills | Status: DC

## 2019-02-19 NOTE — Progress Notes (Deleted)
[facility] [address] [place]. [place] [address] [phone]       OFFICE FOLLOW UP NOTE  Ms. EMAN RYNDERS Date of Birth:  March 07, 1951 Medical Record Number:  [id number]   Reason for Referral:  hospital stroke follow up  CHIEF COMPLAINT:  No chief complaint on file.   HPI: [name] is being seen today for initial visit in the office for subacute right mid corona radiata infarct and frontal cortical on 02/15/18. History obtained from patient and chart review. Reviewed all radiology images and labs personally.  [name] is a 68 y.o. female with history of peripheral vascular disease, stroke x2affecting the left side with no residual deficits, hypertension, diabetes, hyperlipidemia who presented with left-sided weakness, simple partial seizures, and a new headache. She did not receive IV t-PA due to late presentation.  CT had reviewed and was suggestive of acute infarct in the right periventricular insular region.  MRA head reviewed and showed subcentimeter acute/early subacute infarction right mid corona radiata and a few adjacent punctate foci with multiple old infarcts.  MRA of head showed motion degraded but otherwise unremarkable.  Carotid Doppler showed bilateral 1 to 39% ICA plaquing along with the VA antegrade.  2D echo showed an EF of 60 to 65%.  LDL 87 and as patient is intolerant to statins is recommended to continue Lopid.  A1c 8.9 and recommended tight glycemic control and close PCP follow-up at discharge.  Patient was previously on Plavix and Pletal due to right CC stroke in 2012.  It was recommended to add aspirin 325 for 3 months and then discontinue aspirin and continue on both Plavix and Pletal. patient was having multiple episodes of left eye gaze, left head turning, left arm tonic flexion at home along with more episodes of left arm shaking with hospitalist during hospitalization.  No loss of consciousness and able to converse well during  episodes.  Patient did have EEG long-term for continued possible seizure activity but this was negative and Keppra, Depakote, and Vimpat were stopped.  As EEG was negative for seizure activity, symptoms looked at as left upper extremity spasm and baclofen was started.  Patient was recommended to be discharged to [facility] for continued therapies.   04/23/2018 visit: Patient is being seen today for hospital follow-up.  She has been doing well with only mild left-sided weakness but denies upper extremity jerking or headaches.  Patient has completed both PT and OT therapies.  She has returned to all previous activities.  Patient was not discharged on baclofen but denies any jerking activity.  Patient continues aspirin, Pletal, and Plavix without side effects of bleeding or bruising.  Patient continues Lopid for cholesterol management.  Blood pressure today satisfactory 137/64.  States glucose levels have been well in control between 90-100.  Denies new or worsening stroke/TIA symptoms.  Interval history 08/23/2018: Patient is being seen today for scheduled follow-up visit.  Overall she states she is been doing well from a stroke standpoint without residual deficits or recurring of symptoms.  She does endorse she has occasional mild headaches but states they are not bad enough to take any medication for such as Tylenol.  She continues to take Pletal and Plavix without side effects of bleeding or bruising.  Continues to take Lopid for HLD management.  Recent A1c 6.9 and states her glucose levels at home have been stable.  Blood pressure today satisfactory 136/66.  She does state that she is in the process currently  of trying to find a job as she needs additional money to have her truck fixed.  She is also looking forward to possibly obtaining a job as this will help increase her activity level in hopes of losing weight and continuing to lower her A1c level.  No further concerns at today's appointment.  Denies new or  worsening stroke/TIA symptoms.   Doing well plavix good Lopid -  A1c 9.5 - ranging 200-300 - new insulin by PCP. Not improving sugars but feels better as it is less shots - currently on 20 units - was just recently increased. She has tried increasing but had side effects with hands numbness sensation         ROS:   14 system review of systems performed and negative with exception of headache  PMH:  Past Medical History:  Diagnosis Date   Anemia    Arteriosclerotic cardiovascular disease    CKD (chronic kidney disease) stage 3, GFR 30-59 ml/min (HCC)    Claudication in peripheral vascular disease (HCC)    CVA (cerebral vascular accident) ([place])    2012 and 2019-affected left side + dysarthria 2/2 severe R ACA and MCA stenosis   GERD (gastroesophageal reflux disease)    Hyperlipidemia    declines statins   Hypertension    Hypothyroidism    Type 2 diabetes mellitus ([place]) 2000   Vitamin D deficiency     PSH:  Past Surgical History:  Procedure Laterality Date   CESAREAN SECTION     LEFT HEART CATHETERIZATION WITH CORONARY ANGIOGRAM N/A 03/08/2012   Procedure: LEFT HEART CATHETERIZATION WITH CORONARY ANGIOGRAM;  Surgeon: Sinclair Grooms, MD;  Location: [facility] CATH LAB;  Service: Cardiovascular;  Laterality: N/A;   LEFT HEART CATHETERIZATION WITH CORONARY ANGIOGRAM N/A 12/17/2012   Procedure: LEFT HEART CATHETERIZATION WITH CORONARY ANGIOGRAM;  Surgeon: Laverda Page, MD;  Location: [facility] CATH LAB;  Service: Cardiovascular;  Laterality: N/A;   LOOP RECORDER INSERTION N/A 03/05/2018   Procedure: LOOP RECORDER INSERTION;  Surgeon: Evans Lance, MD;  Location: [facility] [facility] CV LAB;  Service: Cardiovascular;  Laterality: N/A;   LOWER EXTREMITY ANGIOGRAM N/A 12/17/2012   Procedure: LOWER EXTREMITY ANGIOGRAM;  Surgeon: Laverda Page, MD;  Location: [facility] CATH LAB;  Service: Cardiovascular;  Laterality: N/A;   TEE WITHOUT CARDIOVERSION N/A 02/28/2018   Procedure:  TRANSESOPHAGEAL ECHOCARDIOGRAM (TEE);  Surgeon: Jerline Pain, MD;  Location: [facility] ENDOSCOPY;  Service: Cardiovascular;  Laterality: N/A;   TUBAL LIGATION      Social History:  Social History   Socioeconomic History   Marital status: Single    Spouse name: Not on file   Number of children: 1   Years of education: 10th- GED   Highest education level: Not on file  Occupational History   Occupation: Retired  Scientist, product/process development strain: Not on file   Food insecurity:    Worry: Not on file    Inability: Not on file   Transportation needs:    Medical: Not on file    Non-medical: Not on file  Tobacco Use   Smoking status: Former Smoker    Last attempt to quit: 02/04/2011    Years since quitting: 8.0   Smokeless tobacco: Never Used  Substance and Sexual Activity   Alcohol use: No    Alcohol/week: 0.0 standard drinks   Drug use: No   Sexual activity: Not on file  Lifestyle   Physical activity:    Days per week: Not on  file    Minutes per session: Not on file   Stress: Not on file  Relationships   Social connections:    Talks on phone: Not on file    Gets together: Not on file    Attends religious service: Not on file    Active member of club or organization: Not on file    Attends meetings of clubs or organizations: Not on file    Relationship status: Not on file   Intimate partner violence:    Fear of current or ex partner: Not on file    Emotionally abused: Not on file    Physically abused: Not on file    Forced sexual activity: Not on file  Other Topics Concern   Not on file  Social History Narrative   Not on file    Family History:  Family History  Problem Relation Age of Onset   Diabetes Brother    Prostate cancer Brother    Diabetes Mother    Diabetes Father    Diabetes Sister    Esophageal cancer Neg Hx    Rectal cancer Neg Hx    Stomach cancer Neg Hx    Colon cancer Neg Hx    Breast cancer Neg Hx      Medications:   Current Outpatient Medications on File Prior to Visit  Medication Sig Dispense Refill   ACCU-CHEK AVIVA PLUS test strip CHECK [place]. 100 each 2   BD INSULIN SYRINGE ULTRAFINE 31G X 15/64" 0.5 ML MISC USE TO INJECT INSULIN TWO TIMES A DAY 100 each 11   cilostazol (PLETAL) 100 MG tablet TAKE 1 TABLET BY MOUTH TWICE A DAY 180 tablet 1   clopidogrel (PLAVIX) 75 MG tablet TAKE 1 TABLET BY MOUTH EVERY DAY IN THE MORNING 90 tablet 3   CVS D3 25 MCG (1000 UT) capsule TAKE 1 CAPSULE (1,000 UNITS TOTAL) BY MOUTH DAILY. 60 capsule 1   famotidine (PEPCID) 20 MG tablet Take 1 tablet (20 mg total) by mouth daily as needed for heartburn or indigestion. 60 tablet 1   gemfibrozil (LOPID) 600 MG tablet TAKE 1 TABLET BY MOUTH TWICE A DAY 180 tablet 3   Insulin Degludec-Liraglutide (XULTOPHY) 100-3.6 UNIT-MG/ML SOPN Inject 16 Units into the skin daily. 15 mL 1   Insulin Pen Needle (B-D UF III MINI PEN NEEDLES) 31G X 5 MM MISC INJECTS INSULIN 2 TIMES PER DAY. 100 each 2   levothyroxine (SYNTHROID, LEVOTHROID) 88 MCG tablet TAKE 1 TABLET BY MOUTH EVERY DAY BEFORE BREAKFAST 90 tablet 3   losartan-hydrochlorothiazide (HYZAAR) 100-25 MG tablet Take 1 tablet by mouth daily. 90 tablet 1   Multiple Vitamins-Minerals (MULTIVITAMIN ADULT PO) Take 1 tablet by mouth once a week. On Wednesday     No current facility-administered medications on file prior to visit.     Allergies:   Allergies  Allergen Reactions   Dilantin [Phenytoin Sodium Extended]     Rash/itching   Insulins Rash    Humalog 75/25 only   Statins Nausea Only and Rash    "sick to my stomach"; describes "rash and a lump" while taking statins     Physical Exam  There were no vitals filed for this visit. There is no height or weight on file to calculate BMI. No exam data present  General: well developed, pleasant African-American female, well nourished, seated, in no evident distress Head: head  normocephalic and atraumatic.   Neck: supple with no carotid or supraclavicular bruits Cardiovascular: regular  rate and rhythm, no murmurs Musculoskeletal: no deformity Skin:  no rash/petichiae Vascular:  Normal pulses all extremities  Neurologic Exam Mental Status: Awake and fully alert. Oriented to place and time. Recent and remote memory intact. Attention span, concentration and fund of knowledge appropriate. Mood and affect appropriate.  Cranial Nerves: Fundoscopic exam reveals sharp disc margins. Pupils equal, briskly reactive to light. Extraocular movements full without nystagmus. Visual fields full to confrontation. Hearing intact. Facial sensation intact. Face, tongue, palate moves normally and symmetrically.  Motor: Normal bulk and tone. Normal strength in all tested extremity muscles  Sensory.: intact to touch , pinprick , position and vibratory sensation.  Coordination: Rapid alternating movements normal in all extremities. Finger-to-nose and heel-to-shin performed accurately bilaterally.  Mild left hand dexterity with weakness Gait and Station: Arises from chair without difficulty. Stance is normal. Gait demonstrates normal stride length and balance . Able to heel, toe and tandem walk without difficulty.  Reflexes: 1+ and symmetric. Toes downgoing.       Diagnostic Data (Labs, Imaging, Testing)  Ct Head Wo Contrast 02/16/2018 IMPRESSION:  Suggestion of acute infarct in the right periventricular and insular region. Consider MRI correlation if clinically indicated. No acute intracranial hemorrhage or mass effect.   Mr Brain Wo Contrast 02/16/2018 MRI head:  1. Subcentimeter acute/early subacute infarction and right mid corona radiata with few adjacent punctate foci. No associated hemorrhage or mass effect.  2. Mild chronic microvascular ischemic changes and mild parenchymal volume loss of the brain.  3. Small chronic infarcts in right genu of corpus callosum, right body of  corpus callosum, and right hemi pons.  MRA head:  Severe motion artifact. Suboptimal assessment for stenosis or small aneurysm. No large vessel occlusion or large aneurysm identified.   Transthoracic Echocardiogram - Left ventricle: The cavity size was normal. Wall thickness was normal. Systolic function was normal. The estimated ejection fraction was in the range of 60% to 65%. Wall motion was normal; there were no regional wall motion abnormalities. Doppler parameters are consistent with abnormal left ventricular relaxation (grade 1 diastolic dysfunction).  Bilateral Carotid Dopplers  02/16/2018 1-39% ICA plaquing. Vertebral artery flow is antegrade.  EEG This normal EEG is recorded in the waking andsleepstate. There was no seizure or seizure predisposition recorded on this study. Please note that a normal EEG does not preclude the possibility of epilepsy.   LTM EEG day 1 There was no pushbutton activations events during this recording. Automated spike detection program did not detect any spikes. Seizure detection program did not detect any seizures .  Waking background activities were marked by8cps posterior dominant developed on the as opposed to right. There is a persistent right hemispheric delta slowing involving the right frontotemporal region throughout the recording. There is no interictal epileptiform discharges. No clinical or subclinical seizures present. Clinical interpretation: Thisday 1 of intensive EEG monitoring with simultaneous video monitoring did not record any clinical subclinical seizures. EEG was abnormal due to persistent right frontotemporal delta slowing suggestive of neuronal dysfunction on a structural vascular or degenerative basis. Clinical correlation is advised.  LTM EEG day 2 Day 2:numerous events of interest were recorded marked by difficulty using left arm,some tremulousness particular with intention or action or trying to  utilize left arm. Sometimes patients opening and closing his left fist. Sometimes there is a she is trying to move her fingers.Time there is a more complex left arm movements side to side up and down in a synchronous fashion events appeared to be in waxing  and waning fashion sometimes lasting a long time however decreased with rest and increased with again trying to utilize left arm. At times patient also complaining of left face been"drawn "" simultaneously with the left arm movements. With patient is completely awake alert and aware during these spells. At times she is eating and utilizing left arm during that time accompanied by numerous pushbutton events as patient experiencing potentially some difficulties in using the left arm. Electrographically was all these numerous events of interest there is unchanged waking background activity is present prior during and after these events of interest. Semiology of these events of interest are inconsistent with seizures. In addition left facial drawing and asymmetry have volitional component possibly. However left arm movements and difficulty associated with utilizing left arm could be related to other etiologies including underlying left arm weakness itself possibly some movement disorder. Background activities were unchanged from previously recorded in marked by right frontotemporal slowing suggestive of neuronal dysfunction in the right frontotemporal region. Clinical interpretation: This day2of intensive EEG monitoring with simultaneous video monitoring did not record any clinical subclinical seizures. EEG was abnormal due to right frontotemporal delta slowing suggestive of neuronal dysfunction on a structural vascular or degenerative basis.  Numerous events of interest were recorded involving left arm and left face as discussed above in details. There was no any EEG changes to suggest seizures. However sometimes with simple partial seizures that  there is no significant EEG changes. However semiology of this events are inconsistent with simple partial seizures. Again could be related to underlying weakness itself, movement disorder versus some volitional component associated with this events. Clinical correlation is advised.      ASSESSMENT: [name] is a 68 y.o. year old female here with right CR infarct on 02/16/2018 secondary to small vessel disease. Vascular risk factors include PVD, HTN, DM, and HLD.  Patient is being seen today for stroke follow-up and overall has been stable from a stroke standpoint with only very mild residual left upper extremity weakness.    PLAN: -Continue aspirin 325 mg daily, clopidogrel 75 mg daily and Pletal  and Lopid  for secondary stroke prevention -F/u with PCP regarding your HLD, HTN and DM management -continue to monitor BP at home -Congratulated on tremendous improvement on A1c level and encouraged continuation with goal of lowering A1c even further with improvement of diet and increasing activity level -Advised to continue to stay active and maintain a healthy diet -Maintain strict control of hypertension with blood pressure goal below 130/90, diabetes with hemoglobin A1c goal below 6.5% and cholesterol with LDL cholesterol (bad cholesterol) goal below 70 mg/dL. I also advised the patient to eat a healthy diet with plenty of whole grains, cereals, fruits and vegetables, exercise regularly and maintain ideal body weight.  Follow up in 6 months or call earlier if needed   Greater than 50% of time during this 25 minute visit was spent on counseling,explanation of diagnosis of right CR infarct, reviewing risk factor management of HTN, DM HLD, planning of further management, discussion with patient and family and coordination of care    Venancio Poisson, [facility]  [facility] [address] [place], [place] [address]  Phone [phone] Fax  [phone]

## 2019-02-19 NOTE — Progress Notes (Signed)
[facility] [address] [place]. [place] [address] [phone]     Virtual Visit via Telephone Note  I connected with [name] on 02/19/19 at 10:45 AM EDT by telephone located remotely within my own home and verified that I am speaking with the correct person using two identifiers who reports being located within her own home.    I discussed the limitations, risks, security and privacy concerns of performing an evaluation and management service by telephone and the availability of in person appointments. I also discussed with the patient that there may be a patient responsible charge related to this service. The patient expressed understanding and agreed to proceed.   History of Present Illness:  [name] is a 68 y.o. female who was initially scheduled for face-to-face office visit today at this time in regards to follow up for subacute right mid corona radiata infarct and frontal cortical infarct in 02/2018 but due to [place], face-to-face office visit rescheduled for non-face-to-face telephone visit.   At prior visit on 08/23/18, she was doing well without residual deficits or reoccuring of symptoms. She continues to do well today without any reoccurring of stroke/TIA symptoms. She continues on Plavix and Pletal without side effects of bleeding or bruising. She continues on lopid for HDL management. She has been having diffuclty maintaining adequate glucose levels with recent A1c 9.5 and daily glucose levels ranging between 200-300. Recent adjustment made to her insluin regimen but has had difficulty increasing further due to complaints of hand/feet numbness/tingling. She has not expressed these concerns with her PCP at this time. She has been attempting to eat healthier and is frustrated with her continuously elevated levels. During discussion of following up with PCP for possible need of medication change, she became upset with this stating that she is sick of  constantly changing medications and why can she just not stay on the medication she is currently on. Attempted to explain reasoning with inability to tolerate higher doses and contained elevated glucose levels. No further concerns at this time. Denies new or worsening stroke/TIA symptoms.      Observations/Objective:  *limited exam due to visit type*  General: pleasant middle aged female asking and answering questions appropriately throughout conversation  A1c  9.5 01/01/19 7.7 09/25/18   Assessment and Plan:  REIDA HEM is a 68 y.o. year old female here with right CR infarct on 02/16/2018 secondary to small vessel disease. Vascular risk factors include PVD, HTN, DM, and HLD. She has been doing well from a stroke standpoint without residual deficits or reoccurring of symptoms.   -Continue aspirin 325 mg daily, clopidogrel 75 mg daily and Pletal  and Lopid  for secondary stroke prevention -F/u with PCP regarding your HLD, HTN and DM management -highly encouraged contacting PCP in regards to difficulty tolerating increased dose of current insulin regimen with continued elevated glucose levels.  It was recommended to possibly initiate low-dose gabapentin to assist with side effects of increased dose but patient declines at this time -continue to monitor BP at home -Advised to continue to stay active and maintain a healthy diet -Maintain strict control of hypertension with blood pressure goal below 130/90, diabetes with hemoglobin A1c goal below 6.5% and cholesterol with LDL cholesterol (bad cholesterol) goal below 70 mg/dL. I also advised the patient to eat a healthy diet with plenty of whole grains, cereals, fruits and vegetables, exercise regularly and maintain ideal body weight.  Follow Up Instructions:  She has been stable from  a stroke standpoint therefore recommend follow-up as needed    I discussed the assessment and treatment plan with the patient.  The patient was provided an  opportunity to ask questions and all were answered to their satisfaction. The patient agreed with the plan and verbalized an understanding of the instructions.   I provided 18 minutes of non-face-to-face time during this encounter.    Venancio Poisson, AGNP-BC  [facility] [address] [place], [place] [address]  Phone [phone] Fax [phone] Note: This document was prepared with digital dictation and possible smart phrase technology. Any transcriptional errors that result from this process are unintentional.

## 2019-02-23 NOTE — Progress Notes (Signed)
I agree with the above plan 

## 2019-02-28 ENCOUNTER — Ambulatory Visit (INDEPENDENT_AMBULATORY_CARE_PROVIDER_SITE_OTHER): Payer: Medicare HMO | Admitting: *Deleted

## 2019-02-28 ENCOUNTER — Ambulatory Visit: Payer: Medicare HMO | Admitting: Adult Health

## 2019-02-28 ENCOUNTER — Other Ambulatory Visit: Payer: Self-pay

## 2019-02-28 DIAGNOSIS — I63311 Cerebral infarction due to thrombosis of right middle cerebral artery: Secondary | ICD-10-CM

## 2019-03-01 LAB — CUP PACEART REMOTE DEVICE CHECK
Date Time Interrogation Session: 20200417220837
Implantable Pulse Generator Implant Date: 20190423

## 2019-03-05 NOTE — Progress Notes (Signed)
Carelink Summary Report / Loop Recorder 

## 2019-03-05 NOTE — Addendum Note (Signed)
Addended by: Hulan Fray on: 03/05/2019 03:09 PM   Modules accepted: Orders

## 2019-03-13 ENCOUNTER — Telehealth: Payer: Self-pay | Admitting: Dietician

## 2019-03-14 ENCOUNTER — Other Ambulatory Visit: Payer: Self-pay | Admitting: Internal Medicine

## 2019-03-14 DIAGNOSIS — R5383 Other fatigue: Secondary | ICD-10-CM

## 2019-03-18 NOTE — Telephone Encounter (Signed)
Left voicemail for return call [name], RD 03/18/2019 1:40 PM.

## 2019-03-19 ENCOUNTER — Encounter: Payer: Self-pay | Admitting: Dietician

## 2019-03-19 ENCOUNTER — Ambulatory Visit (INDEPENDENT_AMBULATORY_CARE_PROVIDER_SITE_OTHER): Payer: Medicare HMO | Admitting: Dietician

## 2019-03-19 DIAGNOSIS — N183 Chronic kidney disease, stage 3 unspecified: Secondary | ICD-10-CM

## 2019-03-19 DIAGNOSIS — E1122 Type 2 diabetes mellitus with diabetic chronic kidney disease: Secondary | ICD-10-CM

## 2019-03-19 NOTE — Telephone Encounter (Signed)
  [name] is a 68 y.o. female who was contacted on behalf of [facility] nutrition and diabetes services.  See nutrition note for today's date.  [place], RD 03/19/2019 10:10 AM.

## 2019-03-19 NOTE — Progress Notes (Signed)
Medical Nutrition Therapy:   Start time:10:08          End time: 10:39 Visit # 1 this year   Assessment:  Primary concerns today: high blood sugars  This is a telephone encounter between [name] and [name] on 03/19/2019 for diabetes follow up. The visit was conducted with the patient located at home and [name] at [facility]. The patient's identity was confirmed using their DOB and current address. The patient has consented to being evaluated through a telephone encounter and understands the associated risks / benefits (allows the patient to remain at home, decreasing exposure to coronavirus). I personally spent 31 minutes on Diabetes Self Management discussion.   Ms. Holcomb says she has a sore throat and cannot get rid of it. She does not feel she needs to be seen for an appointment. She is resting to try to take care of it. She reports her diabetes "is the same"- "200-300s". She says she is taking her medicine as instructed and cannot identify why it is elevated. .   Meal planning- has problems chewing because she has not gotten dentures. She is frustrated by problems in getting dentures form her current dentist- (complains of a charge) She drinks water and a little tea, she says she mostly eats well cooked chicken and vegetables. She denies problems with food insecurity  Medication- increased Xultophy to 20 units and does not want to increase it more because she says she gets negative side effects from it when she does- it makes her fingers numb, toes and legs get numb. We went through her other medications  and she reports taking them as directed.   Sleep- has trouble sleeping-  cannot sleep laying down, due to legs twitching and "carrying" on- " like restless leg syndrome" sleeps in chair from 2-5 am, then naps during the day.  Activity - walks some inside her apartment because of fear of going out with the coronavirus pandemic. We discussed wearing a mask, using safety precautions and  going for walks outdoors when she is feeling better.    She checks her blood pressure every now and then. She reports it is usually under good control.    Wonders if she can take some calcium for her fingernails? I agreed that a calcium supplement would be good since she consumes little dairy due to lactose intolerance. I offered to see if [employer] offers some as their "Advantage" that she can order for free.   Labs/Anthropometrics: Lab Results  Component Value Date   HGBA1C 9.5 (A) 01/01/2019   HGBA1C 7.7 (A) 09/25/2018   HGBA1C 6.9 (A) 05/22/2018    Progress Towards Goal(s):  In progress.   Nutritional Diagnosis:  NB-1.1 Food and nutrition-related knowledge deficit As related to lack of knowlege about how her health insurance works, activity during the covid-19 pandemic, side effects of diabetes medication.  As evidenced by her report.    Intervention:  Nutrition education about     1- benefits for dentures    2- benefits for OTC products    3- activity during COVID-19.    4- side effects of diabetes medications Action Goal: patient to order calcium supplements from her TC booklet  Outcome goal: improved knowledge Coordination of care: consult Dr. Jari Favre for patient issues  Teaching Method Utilized: Visual, Auditory,Hands on Handouts given during visit include:[employer] summary of benefits, Human OTC products booklet Barriers to learning/adherence to lifestyle change: support, fear of medication Demonstrated degree of understanding via:  Teach  Back   Monitoring/Evaluation:  Blood sugars, Dietary intake, exercise,  in 1 month(s). [name], RD 03/19/2019 1:21 PM.

## 2019-03-19 NOTE — Patient Instructions (Addendum)
Hi Ms. Sand,  So nice to speak with you today! I will call you also to explain the following:   [employer] is mailing you a booklet that you can call to order 300$ dollars worth of products every 3 months- I have included the calcium supplement I recommend that you order. You can also order a multivitamin with minerals.  2- You have denture benefits for 1 pari every 5 years, but must go to a dentist who is " IN- NETWORK" with [employer]. See list I sent you.   3- A 5-10 minute WALK a few times a day, especially after meals, may help your blood sugars, your legs and your sleep. IT is OKAY for you to go OUTSIDE. Just stay 6 feel away from people who you do not live with.  I will call you next week to see if you want help placing your order.   [name] [phone]

## 2019-03-20 ENCOUNTER — Other Ambulatory Visit: Payer: Self-pay | Admitting: Dietician

## 2019-03-20 DIAGNOSIS — E1122 Type 2 diabetes mellitus with diabetic chronic kidney disease: Secondary | ICD-10-CM

## 2019-03-20 DIAGNOSIS — N183 Chronic kidney disease, stage 3 unspecified: Secondary | ICD-10-CM

## 2019-03-20 NOTE — Progress Notes (Signed)
referral request °

## 2019-03-25 ENCOUNTER — Encounter: Payer: Self-pay | Admitting: Internal Medicine

## 2019-03-25 ENCOUNTER — Other Ambulatory Visit: Payer: Self-pay | Admitting: Internal Medicine

## 2019-03-25 ENCOUNTER — Ambulatory Visit: Payer: Medicare HMO

## 2019-03-25 ENCOUNTER — Other Ambulatory Visit: Payer: Self-pay

## 2019-03-25 ENCOUNTER — Ambulatory Visit (INDEPENDENT_AMBULATORY_CARE_PROVIDER_SITE_OTHER): Payer: Medicare HMO | Admitting: Internal Medicine

## 2019-03-25 VITALS — BP 146/65 | HR 78 | Temp 97.7°F | Ht 62.0 in | Wt 160.7 lb

## 2019-03-25 DIAGNOSIS — E1122 Type 2 diabetes mellitus with diabetic chronic kidney disease: Secondary | ICD-10-CM

## 2019-03-25 DIAGNOSIS — Z794 Long term (current) use of insulin: Secondary | ICD-10-CM

## 2019-03-25 DIAGNOSIS — E1165 Type 2 diabetes mellitus with hyperglycemia: Secondary | ICD-10-CM | POA: Diagnosis not present

## 2019-03-25 DIAGNOSIS — N183 Chronic kidney disease, stage 3 unspecified: Secondary | ICD-10-CM

## 2019-03-25 DIAGNOSIS — R590 Localized enlarged lymph nodes: Secondary | ICD-10-CM | POA: Diagnosis not present

## 2019-03-25 DIAGNOSIS — R591 Generalized enlarged lymph nodes: Secondary | ICD-10-CM

## 2019-03-25 LAB — POCT GLYCOSYLATED HEMOGLOBIN (HGB A1C): HbA1c POC (<> result, manual entry): >14 % — AB (ref 4.0–5.6)

## 2019-03-25 LAB — BASIC METABOLIC PANEL
Anion gap: 9 (ref 5–15)
BUN: 34 mg/dL — ABNORMAL HIGH (ref 8–23)
CO2: 25 mmol/L (ref 22–32)
Calcium: 10.5 mg/dL — ABNORMAL HIGH (ref 8.9–10.3)
Chloride: 105 mmol/L (ref 98–111)
Creatinine, Ser: 1.69 mg/dL — ABNORMAL HIGH (ref 0.44–1.00)
GFR calc Af Amer: 36 mL/min — ABNORMAL LOW (ref >60)
GFR calc non Af Amer: 31 mL/min — ABNORMAL LOW (ref >60)
Glucose, Bld: 231 mg/dL — ABNORMAL HIGH (ref 70–99)
Potassium: 3.7 mmol/L (ref 3.5–5.1)
Sodium: 139 mmol/L (ref 135–145)

## 2019-03-25 LAB — GLUCOSE, CAPILLARY
Glucose-Capillary: 475 mg/dL — ABNORMAL HIGH (ref 70–99)
Glucose-Capillary: 425 mg/dL — ABNORMAL HIGH (ref 70–99)
Glucose-Capillary: 291 mg/dL — ABNORMAL HIGH (ref 70–99)
Glucose-Capillary: 256 mg/dL — ABNORMAL HIGH (ref 70–99)

## 2019-03-25 MED ORDER — INSULIN ASPART PROT & ASPART (70-30 MIX) 100 UNIT/ML PEN: 25.0000 [IU] | PEN_INJECTOR | Freq: Two times a day (BID) | SUBCUTANEOUS | 0 refills | Status: DC

## 2019-03-25 MED ORDER — INSULIN ASPART PROT & ASPART (70-30 MIX) 100 UNIT/ML PEN: 25.0000 [IU] | PEN_INJECTOR | Freq: Every day | SUBCUTANEOUS | 0 refills | Status: DC

## 2019-03-25 MED ORDER — INSULIN ASPART 100 UNIT/ML ~~LOC~~ SOLN
20.0000 [IU] | Freq: Once | SUBCUTANEOUS | Status: AC
  Administered 2019-03-25: 20 [IU] via SUBCUTANEOUS

## 2019-03-25 MED ORDER — PIOGLITAZONE HCL 15 MG PO TABS: 15.0000 mg | ORAL_TABLET | Freq: Every day | ORAL | 0 refills | Status: DC

## 2019-03-25 NOTE — Assessment & Plan Note (Signed)
Patient reports that about 1.5 weeks ago after she took a bath she went outside and she noticed some neck pain and a small lump under her neck area. She reports that it was painful at rest, pain with swallowing, and that it improved with pressure. She reported that around this time she was having some ear pain, headaches, subjective fevers and chills. She denied any nausea, vomiting, shortness of breath, cough, wheezing or other symptoms. She was taking some tylenol which she reported helped. She reported that the swelling has improved and the pain has improved. On exam she had a small, painful lymph node around the submandibular area, she had normal tympanic membranes and no erythema or edema around the area. This appears to be an infectious lymphadenopathy given the improvement and other infectious signs around that time.   Plan: -No further work up, advised patient to continue taking the tylenol as needed and to let us know if the symptoms develop again

## 2019-03-25 NOTE — Progress Notes (Signed)
   CC: Neck lump, DM  HPI:  [name] is a 68 y.o.   Past Medical History:  Diagnosis Date  . Anemia   . Arteriosclerotic cardiovascular disease   . CKD (chronic kidney disease) stage 3, GFR 30-59 ml/min (HCC)   . Claudication in peripheral vascular disease ([place])   . CVA (cerebral vascular accident) ([place])    2012 and 2019-affected left side + dysarthria 2/2 severe R ACA and MCA stenosis  . GERD (gastroesophageal reflux disease)   . Hyperlipidemia    declines statins  . Hypertension   . Hypothyroidism   . Type 2 diabetes mellitus ([place]) 2000  . Vitamin D deficiency    Review of Systems:  Reports polyuria, polydipsia, polyphagia, right neck pain, subjective fevers, and difficulty swallowing.  Denies nausea, vomiting, shortness of breath, cough, lightheadedness, dizziness, or other symptoms.  Physical Exam:  Vitals:   03/25/19 0857  BP: (!) 146/65  Pulse: 78  Temp: 97.7 F (36.5 C)  TempSrc: Oral  SpO2: 100%  Weight: 160 lb 11.2 oz (72.9 kg)  Height: 5\' 2"  (1.575 m)   Physical Exam  Constitutional: She is well-developed, well-nourished, and in no distress.  HENT:  Head: Normocephalic and atraumatic.  Right Ear: External ear normal.  Left Ear: External ear normal.  Mouth/Throat: Oropharynx is clear and moist. No oropharyngeal exudate.  Eyes: Pupils are equal, round, and reactive to light.  Cardiovascular: Normal rate and regular rhythm.  Pulmonary/Chest: Effort normal and breath sounds normal.  Lymphadenopathy:    She has cervical adenopathy (small right submandibular lymph node, tender to palpation, no other areas of enlarged lymph nodes).  Skin: Skin is warm and dry.  Psychiatric: Mood and affect normal.     Assessment & Plan:   See Encounters Tab for problem based charting.  Patient discussed with Dr. Beryle Beams

## 2019-03-25 NOTE — Patient Instructions (Signed)
Ms. TOMICKA LOVER,  It was a pleasure to see you today. Thank you for coming in.   Today we discussed your neck pain/mass and your diabetes. In regards to this I think that the neck pain/mass was related to an infection, this should improve with time and you can keep taking tylenol for the pain. You can continue taking insulin 70/30 25 units and start taking pioglitazone 15 mg daily, please continue checking your blood sugars. If your blood sugars continue to remain >200 please contact the clinic, we may have to either increase your pill strength or increase your insulin. We have also checked your A1c.    Please return to clinic in 1 month or sooner if needed.   Thank you again for coming in.   Asencion Noble.D.

## 2019-03-25 NOTE — Assessment & Plan Note (Addendum)
Patient reports that she has not been taking her Xultophy for about 2 weeks because she thought it was causing her neck pain. She started taking insulin 70/30 25 units daily and she reports that her blood sugars have been around 200-300. She also endorses polyuria, polydipsia, and polyphagia. She reports some bilateral LE numbness and tingling. Her last A1c was elevated at 9.3, today it is significantly elevated at >14. We discussed that I do not think that her neck pain is related to her medication since this is not a common side effect and she was having other infectious symptoms around that time however she was adamant that she did not want to restart taking the medication because she feels like it was the cause of the swelling. We discussed that she will likely need to increase her dose of insulin however she reported that she did not want to do that either because that is what she feels causes her neuropathy. I went over that her high blood sugar levels are what is likely causing this symptom but she still did not want to increase her insulin. She reported that she would start another pill and we discussed starting pioglitazone, which she is amenable to.   Plan: Increase 70/30 Insulin 25 units BID (she may need this increased) Add pioglitazone 15 mg daily  Advised patient to call us in 1 week to let us know how her sugars are, if still elevated >140 can go up on pioglitazone or insulin Check a1c  Glucose elevated at 475, gave some 20 units novolog and rechecked glucose 1 hour later. CBGs came down to 375 then 281. Patient had left but returned due to bilateral leg cramps. She reports that this often happens when she gets to much insulin. Will check a BMP.  BMP showed a normal K and Na, no acute findings. Glucose down to 231. Patient advised to continue to perform stretches. She reported that she was feeling better with no leg cramps.

## 2019-03-25 NOTE — Progress Notes (Signed)
Medicine attending: Medical history, presenting problems, physical findings, and medications, reviewed with resident physician Dr Lonia Skinner on the day of the patient visit and I concur with her evaluation and management plan. 1.Transient swelling & pain right neck, ear pain, subjective fever, chills, x 1 wk, all now resolving. No mass or adenopathy on exam. 2. Poorly controlled DM-2 on 25 units once daily 70/30 insulin & Xoltophy. Pt convinced neck sxs due to [place]. Glucose 475. Rx novolog 20 units SQ given in clinic. D/C Xoltophy. Change 70/30 to 25 units BID; add pioglitazone. (CKD 3: contraindcation for metformin).

## 2019-03-26 ENCOUNTER — Ambulatory Visit (INDEPENDENT_AMBULATORY_CARE_PROVIDER_SITE_OTHER): Payer: Medicare HMO | Admitting: Dietician

## 2019-03-26 ENCOUNTER — Encounter: Payer: Self-pay | Admitting: Dietician

## 2019-03-26 DIAGNOSIS — N183 Chronic kidney disease, stage 3 (moderate): Secondary | ICD-10-CM

## 2019-03-26 DIAGNOSIS — Z794 Long term (current) use of insulin: Secondary | ICD-10-CM

## 2019-03-26 DIAGNOSIS — E1122 Type 2 diabetes mellitus with diabetic chronic kidney disease: Secondary | ICD-10-CM

## 2019-03-26 DIAGNOSIS — Z713 Dietary counseling and surveillance: Secondary | ICD-10-CM | POA: Diagnosis not present

## 2019-03-26 NOTE — Progress Notes (Signed)
  Medical Nutrition Therapy:   Start time:9:35          End time: 9:50 Visit # 2 this year   Assessment:  Primary concerns today: high blood sugars  This is a telephone encounter between [name] and [name] on 513/2020 for diabetes follow up. The visit was conducted with the patient located at home and [name] at [facility]. The patient's identity was confirmed using their DOB and current address. The patient has consented to being evaluated through a telephone encounter and understands the associated risks / benefits (allows the patient to remain at home, decreasing exposure to coronavirus). I personally spent 15 minutes on Diabetes Self Management discussion.   [name] received the information about her OTC and dental benefits. She verbalized understanding and said she'd ask if she has questions. Regarding her diabetes she is frustrated and wants to "give up". I asked her what she meant and she said I'll only eat vegetables and take my medicine.  .   Meal planning- feels like she has to eat only fresh and only vegetables, frustrated because this is difficult for her with poor dentition. I offered her canned goods from our pantry, but she denied the need.   Medication-  Says she has not started pioglitazone and is taking her insulin as directed  Blood sugars- checking one time a day with results in th 200s and 300s.  Activity - walks inside her apartment   Labs/Anthropometrics: Lab Results  Component Value Date   HGBA1C >14.0 (A) 03/25/2019   HGBA1C 9.5 (A) 01/01/2019   HGBA1C 7.7 (A) 09/25/2018   HGBA1C 6.9 (A) 05/22/2018   HGBA1C 8.9 (H) 02/16/2018     Progress Towards Goal(s):  In progress.   Nutritional Diagnosis:  NB-1.1 Food and nutrition-related knowledge deficit As related to lack of knowlege about how her health insurance works, activity during the covid-19 pandemic, side effects of diabetes medication.  As evidenced by her report.    Intervention:  Nutrition  education about soft foods that are low carb and soft enough to chew Action Goal: patient to order calcium supplements from her TC booklet, buy baby food or soft meats and vegertables  Outcome goal: improved knowledge and blood sugars  Teaching Method Utilized: Visual, Auditory,Hands on Handouts given during visit include:  After visit summary Barriers to learning/adherence to lifestyle change: support, fear of medication Demonstrated degree of understanding via:  Teach Back   Monitoring/Evaluation:  Blood sugars, Dietary intake, exercise, depression screening in 1 week(s). [place], RD 03/26/2019 10:06 AM.

## 2019-03-26 NOTE — Patient Instructions (Signed)
Soft-Food Eating Plan A soft-food eating plan includes foods that are safe and easy to chew and swallow.  It is fine to buy baby foods from the grocery store or you can make your own.  Soups are great- chicken, carrot, tomato, pumpkin- see recipes   General guidelines   Take small bites of food, or cut food into pieces about  inch or smaller. Bite-sized pieces of food are easier to chew and swallow.  Eat moist foods. Avoid overly dry foods.  Avoid foods that: ? Are difficult to swallow, such as dry, chunky, crispy, or sticky foods. ? Are difficult to chew, such as hard, tough, or stringy foods. ? Contain nuts, seeds, or fruits.  Cooking  Cook meats so they stay tender and moist. Use methods like braising, stewing, or baking in liquid.  Cook vegetables and fruit until they are soft enough to be mashed with a fork.  Peel soft, fresh fruits such as peaches, nectarines, and melons.  When making soup, make sure chunks of meat and vegetables are smaller than  inch.  Reheat leftover foods slowly so that a tough crust does not form.  What foods are allowed?  Grains Rye, pumpernickel or sourdough Breads. Old fashioned oats, barley. Well-cooked pasta and brown rice.  Vegetables All soft-cooked vegetables. Shredded lettuce.  Fruits All canned and cooked fruits. Soft, peeled fresh fruits. Strawberries.  Dairy Milk. Yogurt. Cottage cheese. Soft cheese without the rind.  Meats and other protein foods Tender, moist ground meat, poultry, or fish. Meat cooked in gravy or sauces. Eggs.  Sweets and desserts Fruits, Greek yogurt. Pudding.  Fats and oils Margarine. Olive, canola, sunflower, and grapeseed oil. Smooth salad dressing. Avacado. Mayonnaise. Gravy.  Summary  Generally, the foods should be soft enough to be mashed with a fork.  Avoid foods that are dry, hard to chew, crunchy, sticky, stringy, or crispy.  [name] [phone]  .

## 2019-04-02 ENCOUNTER — Ambulatory Visit (INDEPENDENT_AMBULATORY_CARE_PROVIDER_SITE_OTHER): Payer: Medicare HMO | Admitting: *Deleted

## 2019-04-02 DIAGNOSIS — I63311 Cerebral infarction due to thrombosis of right middle cerebral artery: Secondary | ICD-10-CM

## 2019-04-03 ENCOUNTER — Other Ambulatory Visit: Payer: Self-pay

## 2019-04-03 LAB — CUP PACEART REMOTE DEVICE CHECK
Date Time Interrogation Session: 20200520220747
Implantable Pulse Generator Implant Date: 20190423

## 2019-04-11 NOTE — Progress Notes (Signed)
Carelink Summary Report / Loop Recorder 

## 2019-04-17 ENCOUNTER — Telehealth: Payer: Self-pay | Admitting: Internal Medicine

## 2019-04-17 ENCOUNTER — Ambulatory Visit (INDEPENDENT_AMBULATORY_CARE_PROVIDER_SITE_OTHER): Payer: Medicare HMO | Admitting: Dietician

## 2019-04-17 ENCOUNTER — Other Ambulatory Visit: Payer: Self-pay

## 2019-04-17 ENCOUNTER — Encounter: Payer: Self-pay | Admitting: Dietician

## 2019-04-17 DIAGNOSIS — N183 Chronic kidney disease, stage 3 unspecified: Secondary | ICD-10-CM

## 2019-04-17 DIAGNOSIS — Z713 Dietary counseling and surveillance: Secondary | ICD-10-CM | POA: Diagnosis not present

## 2019-04-17 DIAGNOSIS — E1122 Type 2 diabetes mellitus with diabetic chronic kidney disease: Secondary | ICD-10-CM

## 2019-04-17 NOTE — Patient Instructions (Addendum)
Low carb foods for shopping list:  Avocados  Olives Olive or canola oil [place] yogurt-plain or zero added sugar isbest Vegetables-any except potatoes, corn  peppers, onions, mushrooms, broccoli, cauliflower, greens, green beans, tomatoes, carrots, cabbage, okra, etc...  Beans - pintos, northern, back beans, navy beans, refried beans, etc Fish, Eggs, salmon, tuna, tofu Nuts- peanut butter, almond butter Unsweetened Soy milk, or low fat milk  Lower carb fruit but could add flavor- unsweetened applesauce,- strawberries, blueberries  Can buy on sale, freeze and eat small amount at a time.   Also anything with carbs less than 15 grams per servings should be fine.   Hope this helps!

## 2019-04-17 NOTE — Telephone Encounter (Signed)
[name] plyler was calling pt, they have now spoken

## 2019-04-17 NOTE — Telephone Encounter (Signed)
Pt missed a call; [phone]

## 2019-04-17 NOTE — Progress Notes (Signed)
  Medical Nutrition Therapy:   Start time:10:30  End time 10:35 Tried calling- I was disconnected. Called back was disconnected again after breif conversation.  Called back and no answer. Left a message for return call Called her again. Start time: 10:42 End time: 11:01  Time spent talking to [name]: 24 minutes Visit # 3 this year   Assessment:  Primary concerns today: high blood sugars  This is a telephone encounter between [name] and [name] on 6/4//2020 for diabetes follow up. The visit was conducted with the patient located at home and [name]. The patient's identity was confirmed using their DOB and current address. The patient has consented to being evaluated through a telephone encounter and understands the associated risks / benefits (allows the patient to remain at home, decreasing exposure to coronavirus).  [name] reports her blood sugars are "in the 400s." for 2-3 days and she does not know why.  Taking insulin and actos as directed without skipping doses Denies fever, symptoms of infections, says she is not taking any more insulin.  Does not feel she needs an appointment. " I am alright".   Beverages- water sprite zero, eating egg salad, tomato soup, tuna salad, salmon patties, grits, has not gone food shopping in several weeks, plans to go this weekend.   Her plan to lower her blood sugar is to work on her diet. We discussed lower carb with healthy fats. Suggested she add foods such as avocados olive/canola oil, vegetables, peanut butter and greek yogurt to her meals.   Diabetes Distress: Consider the degree to which each of these two items may have distressed or bothered you DURING THE PAST MONTH and circle the appropriate number:  A slight problem  Feeling overwhelmed by the demands of living with diabetes 2  Feeling that I am often failing with my diabetes routine 2   The scores are less than 3 and below 6 total so it is acceptable diabetes  distress.   Progress Towards Goal(s):  In progress.   Nutritional Diagnosis:  NB-1.1 Food and nutrition-related knowledge deficit As related to lack of knowlege about how her health insurance works, activity during the covid-19 pandemic, side effects of diabetes medication.  As evidenced by her report.    Intervention:  Nutrition education about soft, low carb foods  Action Goal: patient to order calcium supplements from her TC booklet  Outcome goal: improved knowledge and blood sugars  Teaching Method Utilized: Visual, Auditory,Hands on Handouts given during visit include:  After visit summary Barriers to learning/adherence to lifestyle change: support, fear of medication Demonstrated degree of understanding via:  Teach Back   Monitoring/Evaluation:  Blood sugars, Dietary intake, exercise, depression screening in 4 day(s). [name], RD 04/17/2019 10:26 AM.

## 2019-04-18 ENCOUNTER — Other Ambulatory Visit: Payer: Self-pay | Admitting: Internal Medicine

## 2019-04-18 DIAGNOSIS — N183 Chronic kidney disease, stage 3 unspecified: Secondary | ICD-10-CM

## 2019-04-18 DIAGNOSIS — E1122 Type 2 diabetes mellitus with diabetic chronic kidney disease: Secondary | ICD-10-CM

## 2019-04-21 ENCOUNTER — Other Ambulatory Visit: Payer: Self-pay

## 2019-04-21 ENCOUNTER — Encounter: Payer: Self-pay | Admitting: Dietician

## 2019-04-21 ENCOUNTER — Ambulatory Visit (INDEPENDENT_AMBULATORY_CARE_PROVIDER_SITE_OTHER): Payer: Medicare HMO | Admitting: Dietician

## 2019-04-21 DIAGNOSIS — Z713 Dietary counseling and surveillance: Secondary | ICD-10-CM

## 2019-04-21 DIAGNOSIS — E1122 Type 2 diabetes mellitus with diabetic chronic kidney disease: Secondary | ICD-10-CM | POA: Diagnosis not present

## 2019-04-21 DIAGNOSIS — N183 Chronic kidney disease, stage 3 unspecified: Secondary | ICD-10-CM

## 2019-04-21 NOTE — Progress Notes (Signed)
  Medical Nutrition Therapy follow up:    Start time:10:05  End time 10:30  Time spent talking to Ms. Vea: 25 minutes Visit # 4 this year   Assessment:  Primary concerns today: high blood sugars  This is a telephone encounter between [name] and Ezri Landers on 6/8//2020 for diabetes follow up. The visit was conducted with the patient located at home and [name] at [facility]. The patient's identity was confirmed using their DOB and current address. The patient has consented to being evaluated through a telephone encounter and understands the associated risks / benefits (allows the patient to remain at home, decreasing exposure to coronavirus).  Ms. Matchett is trying to use lifestyle changes to reduce her blood sugars. She reports her blood sugars are the same- fasting today was 375 mg/dl.  Diabetes Medicines: Novolog mix 70/30 25 units, actos  Food and beverage intake water, diet drinks,  4 Hard boiled eggs this am, egg salad, sardines, crackers, vegetable soup, green tea, strawberries, oranges, watermelon, beans, broccoli, avocados, yogurt.   She has not purchased her free OTC items from her health insurance yet.   Her plan to continue to lower her blood sugar is to : 1- continue eating  Lower carb, high fiber and healthy fat diet 2- move more- walk around her building 5 times on 3 days in the next week and march in place for 10 minutes other days while watching tv    Progress Towards Goal(s):  In progress.   Nutritional Diagnosis:  NB-1.1 Food and nutrition-related knowledge deficit As related to lack of knowlege about how her health insurance works, activity during the covid-19 pandemic, side effects of diabetes medication and high fiber, healthy fat meal planning is improving  As evidenced by her report.    Intervention:  Nutrition education about soft, low carb foods  Action Goal: see patient instructions- patient does not want these mailed  Outcome goal: improved knowledge  and blood sugars  Teaching Method Utilized: Visual, Auditory,Hands on Handouts given during visit include:  After visit summary Barriers to learning/adherence to lifestyle change: support, fear of medication Demonstrated degree of understanding via:  Teach Back   Monitoring/Evaluation:  Blood sugars, Dietary intake, exercise, depression screening in 1 week(s). [name], RD 04/21/2019 9:58 AM.

## 2019-04-24 ENCOUNTER — Encounter: Payer: Self-pay | Admitting: *Deleted

## 2019-04-28 ENCOUNTER — Ambulatory Visit: Payer: Medicare HMO | Admitting: Dietician

## 2019-04-30 NOTE — Telephone Encounter (Signed)
Error

## 2019-05-02 ENCOUNTER — Other Ambulatory Visit: Payer: Self-pay

## 2019-05-02 ENCOUNTER — Encounter: Payer: Self-pay | Admitting: Dietician

## 2019-05-02 ENCOUNTER — Ambulatory Visit (INDEPENDENT_AMBULATORY_CARE_PROVIDER_SITE_OTHER): Payer: Medicare HMO | Admitting: Dietician

## 2019-05-02 DIAGNOSIS — N183 Chronic kidney disease, stage 3 unspecified: Secondary | ICD-10-CM

## 2019-05-02 DIAGNOSIS — Z713 Dietary counseling and surveillance: Secondary | ICD-10-CM | POA: Diagnosis not present

## 2019-05-02 DIAGNOSIS — E1122 Type 2 diabetes mellitus with diabetic chronic kidney disease: Secondary | ICD-10-CM | POA: Diagnosis not present

## 2019-05-02 DIAGNOSIS — Z6829 Body mass index (BMI) 29.0-29.9, adult: Secondary | ICD-10-CM

## 2019-05-02 NOTE — Patient Instructions (Signed)
What you are doing right now is working- to improve blood sugars more keep doing it- eating the foods you are eating.   Think about taking to you doctor about using [place] or a diabetes medicine in that category. Rybelsus is the pill form, ozempic is one time a week injection.    let me know if you need more help finding  A dentist.   I will call you next week on Friday, if I cannot call on Friday I will call on Thursday.  [name] [phone]

## 2019-05-02 NOTE — Addendum Note (Signed)
Addended by: [name] on: 05/02/2019 01:24 PM   Modules accepted: Orders

## 2019-05-02 NOTE — Progress Notes (Addendum)
  Medical Nutrition Therapy follow up:    Start time:10:20 End time 11:20  Time spent talking to [name]: 60 minutes Visit # 5   Assessment:  Primary concerns today: high blood sugars and help finding a dentist in network with [employer] [name] is trying to use lifestyle changes to reduce her blood sugars. She reports her blood Blood sugars: Meter down load shows elevated blood sugars over the past month with average 366 with a trend to low 200s the last 3 days.  Diabetes Medicines: Novolog mix 70/30 25 units two times a day, stopped actos  Food and beverage intake water, low carb, high fiber fruits and vegetables, eating peppers, sardines,no crackers or bread rice or pasta, yogurt.  Activity: she is finding this hard to do with rain and covid-19. We discussed indoor exercises and handout was provided to use with the exercise bands she already has at home.  Her plan to continue to lower her blood sugar is to : 1- continue eating  Lower carb, high fiber and healthy fat diet 2- move more- try band exercise indoors, walk if not raining  3- call dentists to find one she can go to to help get her teeth fixed   Progress Towards Goal(s):  Some progress.   Nutritional Diagnosis:  NB-1.1 Food and nutrition-related knowledge deficit As related to lack of knowlege about how her health insurance works, activity during the covid-19 pandemic, side effects of diabetes medication and high fiber, healthy fat meal planning is improving  As evidenced by her report.    Intervention:  Nutrition education about soft, low carb foods  Action Goal: keep eating lower carb and healthy fats- it is working!  Outcome goal: improved knowledge and blood sugars  Teaching Method Utilized: Visual, Auditory,Hands on Handouts given during visit include:  After visit summary Barriers to learning/adherence to lifestyle change: support, fear of medication Demonstrated degree of understanding via:  Teach Back    Monitoring/Evaluation:  Blood sugars, Dietary intake, exercise in 6 week(s) in office, weekly phone follow ups. [name], RD 05/02/2019 11:23 AM.

## 2019-05-05 ENCOUNTER — Other Ambulatory Visit: Payer: Self-pay

## 2019-05-05 ENCOUNTER — Ambulatory Visit (INDEPENDENT_AMBULATORY_CARE_PROVIDER_SITE_OTHER): Payer: Medicare HMO | Admitting: *Deleted

## 2019-05-05 DIAGNOSIS — N183 Chronic kidney disease, stage 3 unspecified: Secondary | ICD-10-CM

## 2019-05-05 DIAGNOSIS — I63411 Cerebral infarction due to embolism of right middle cerebral artery: Secondary | ICD-10-CM | POA: Diagnosis not present

## 2019-05-05 DIAGNOSIS — E1122 Type 2 diabetes mellitus with diabetic chronic kidney disease: Secondary | ICD-10-CM

## 2019-05-05 NOTE — Telephone Encounter (Signed)
She told me last week that she is currently suing 25 units of Novolog Mix 70/30 insulin two times a day. She'll need 4500 mL for a 90 day supply which is preferred by Medicare. She says she stopped taking actos.

## 2019-05-05 NOTE — Telephone Encounter (Signed)
Pt called and states in the past she was getting her insulin Novolog 70/30 flexpen as a 90 day supply, she now only gets 1 pen.  She is requesting a 90 day supply again. Will forward to PCP, as well as, Yaakov Guthrie (as she see's her on a routine basis). SChaplin, RN,BSN

## 2019-05-06 LAB — CUP PACEART REMOTE DEVICE CHECK
Date Time Interrogation Session: 20200622224126
Implantable Pulse Generator Implant Date: 20190423

## 2019-05-06 MED ORDER — BD PEN NEEDLE MINI U/F 31G X 5 MM MISC: 3 refills | Status: AC

## 2019-05-06 MED ORDER — NOVOLOG MIX 70/30 FLEXPEN (70-30) 100 UNIT/ML ~~LOC~~ SUPN: 25.0000 [IU] | PEN_INJECTOR | Freq: Two times a day (BID) | SUBCUTANEOUS | 3 refills | Status: DC

## 2019-05-09 ENCOUNTER — Ambulatory Visit: Payer: Medicare HMO | Admitting: Dietician

## 2019-05-14 NOTE — Progress Notes (Signed)
Carelink Summary Report / Loop Recorder 

## 2019-05-28 ENCOUNTER — Other Ambulatory Visit: Payer: Self-pay

## 2019-05-28 MED ORDER — ACCU-CHEK SOFT TOUCH LANCETS MISC: 12 refills | Status: AC

## 2019-05-28 NOTE — Telephone Encounter (Signed)
Requesting a refill on ACCU-Chek lancets to be filled @  [id number] - [place], [place] [phone] (Phone) [phone] (Fax)

## 2019-05-28 NOTE — Telephone Encounter (Signed)
Request is not on current med list - please  Review and include dx code an how often.

## 2019-06-08 LAB — CUP PACEART REMOTE DEVICE CHECK
Date Time Interrogation Session: 20200725233721
Implantable Pulse Generator Implant Date: 20190423

## 2019-06-09 ENCOUNTER — Ambulatory Visit (INDEPENDENT_AMBULATORY_CARE_PROVIDER_SITE_OTHER): Payer: Medicare HMO | Admitting: *Deleted

## 2019-06-09 DIAGNOSIS — I739 Peripheral vascular disease, unspecified: Secondary | ICD-10-CM

## 2019-06-19 ENCOUNTER — Other Ambulatory Visit: Payer: Self-pay

## 2019-06-19 DIAGNOSIS — I10 Essential (primary) hypertension: Secondary | ICD-10-CM

## 2019-06-19 MED ORDER — LOSARTAN POTASSIUM-HCTZ 100-25 MG PO TABS: 1.0000 | ORAL_TABLET | Freq: Every day | ORAL | 1 refills | Status: DC

## 2019-06-19 NOTE — Telephone Encounter (Signed)
losartan-hydrochlorothiazide (HYZAAR) 100-25 MG tablet, REFILL REQUEST @  [id number] [place], [place] - [place] [phone] (Phone) [phone] (Fax)

## 2019-06-24 ENCOUNTER — Ambulatory Visit (INDEPENDENT_AMBULATORY_CARE_PROVIDER_SITE_OTHER): Payer: Medicare HMO | Admitting: Dietician

## 2019-06-24 ENCOUNTER — Encounter: Payer: Self-pay | Admitting: Dietician

## 2019-06-24 DIAGNOSIS — E1122 Type 2 diabetes mellitus with diabetic chronic kidney disease: Secondary | ICD-10-CM

## 2019-06-24 DIAGNOSIS — N183 Chronic kidney disease, stage 3 (moderate): Secondary | ICD-10-CM

## 2019-06-24 DIAGNOSIS — Z713 Dietary counseling and surveillance: Secondary | ICD-10-CM

## 2019-06-24 NOTE — Patient Instructions (Signed)
Dear Geraldo Docker,  My direct office phone number is [phone].   Please call anytime.  Sincerely,  [name]

## 2019-06-24 NOTE — Progress Notes (Signed)
  Medical Nutrition Therapy TELEHEALTH follow up:    Start time:10:30 End time 11:00  Time spent talking to [name]: 30 minutes Visit # 6   This is a telephone encounter between [facility] and [name] on 06/24/2019 for Medical Nutrition therapy follow up. The visit was conducted with the patient located at home and [name] at [facility]. The patient's identity was confirmed using their DOB and current address. The patient has consented to being evaluated through a telephone encounter and understands the associated risks / benefits (allows the patient to remain at home, decreasing exposure to coronavirus). I personally spent 30 minutes on medical nutrition therapy discussion.   Assessment:  Primary concerns today: high blood sugars  [name] is trying to use lifestyle changes to reduce her blood sugars. She reports she is changing doctors to one that will not change every three years  Blood sugars: Meter readings reported are: 264/288/279/252/232/317/342/299.  Diabetes Medicines: Novolog mix 70/30 25 units two times a day at 8 AM and 5 PM before meals  Food and beverage intake water, sprite zero that she reports increases her blood sugar so she has kept it to a minimum. Has added meat because her legs feel  Better when she does.  Breakfast- 2 eggs, 2 sl toast, [place] sausage Lunch- salmon, chicken or egg salad, grapefruit Dinner- chicken soup made from tomato sauce chicken and frozen vegetables. .  Activity: she is finding this hard to do with covid-19. She is mostly doing indoor exercises .  Her plan to continue to lower her blood sugar is to : 1- continue eating  Lower carb, high fiber and healthy fat diet 2- move more- thinking about exercise class  3- consider talking to her doctor about options for diabetes medicine    Progress Towards Goal(s):  No progress.   Nutritional Diagnosis:  NB-1.1 Food and nutrition-related knowledge deficit As related to lack of knowledge about how   To lower blood sugars with activity,  diabetes medication and high fiber, healthy fat meal planning is improving  As evidenced by her report.    Intervention:  Nutrition education about diabetes meal planning, activity effects on blood sugars, diabetes medicines Goal: keep eating more vegetables and lean protein and moving as much as possible  Outcome goal: improved knowledge and blood sugars  Teaching Method Utilized: Auditory Handouts given during visit include:  After visit summary Barriers to learning/adherence to lifestyle change: support, fear of medication Demonstrated degree of understanding via:  Teach Back   Monitoring/Evaluation:  Blood sugars, Dietary intake, exercise as needed. [name], RD 06/24/2019 10:52 AM.

## 2019-06-25 NOTE — Progress Notes (Signed)
Carelink Summary Report / Loop Recorder 

## 2019-07-10 ENCOUNTER — Ambulatory Visit (INDEPENDENT_AMBULATORY_CARE_PROVIDER_SITE_OTHER): Payer: Medicare HMO | Admitting: *Deleted

## 2019-07-10 DIAGNOSIS — Z8673 Personal history of transient ischemic attack (TIA), and cerebral infarction without residual deficits: Secondary | ICD-10-CM | POA: Diagnosis not present

## 2019-07-11 LAB — CUP PACEART REMOTE DEVICE CHECK
Date Time Interrogation Session: 20200827233832
Implantable Pulse Generator Implant Date: 20190423

## 2019-07-17 NOTE — Progress Notes (Signed)
Carelink Summary Report / Loop Recorder 

## 2019-07-22 ENCOUNTER — Other Ambulatory Visit: Payer: Self-pay | Admitting: Oncology

## 2019-07-22 ENCOUNTER — Other Ambulatory Visit: Payer: Self-pay | Admitting: Family Medicine

## 2019-07-22 DIAGNOSIS — Z1231 Encounter for screening mammogram for malignant neoplasm of breast: Secondary | ICD-10-CM

## 2019-07-23 ENCOUNTER — Encounter: Payer: Medicare HMO | Admitting: Internal Medicine

## 2019-08-12 ENCOUNTER — Ambulatory Visit (INDEPENDENT_AMBULATORY_CARE_PROVIDER_SITE_OTHER): Payer: Medicare HMO | Admitting: *Deleted

## 2019-08-12 DIAGNOSIS — I739 Peripheral vascular disease, unspecified: Secondary | ICD-10-CM | POA: Diagnosis not present

## 2019-08-13 ENCOUNTER — Telehealth: Payer: Self-pay | Admitting: *Deleted

## 2019-08-13 DIAGNOSIS — E1165 Type 2 diabetes mellitus with hyperglycemia: Secondary | ICD-10-CM

## 2019-08-13 LAB — CUP PACEART REMOTE DEVICE CHECK
Date Time Interrogation Session: 20200929234733
Implantable Pulse Generator Implant Date: 20190423

## 2019-08-14 MED ORDER — ACCU-CHEK AVIVA PLUS VI STRP: 1.0000 | ORAL_STRIP | Freq: Two times a day (BID) | 1 refills | Status: AC

## 2019-08-14 NOTE — Telephone Encounter (Signed)
PCP was removed noted in comments patient has transferred care.

## 2019-08-22 NOTE — Progress Notes (Signed)
Carelink Summary Report / Loop Recorder 

## 2019-08-26 ENCOUNTER — Other Ambulatory Visit: Payer: Self-pay | Admitting: *Deleted

## 2019-08-26 NOTE — Telephone Encounter (Signed)
error 

## 2019-09-03 ENCOUNTER — Ambulatory Visit
Admission: RE | Admit: 2019-09-03 | Discharge: 2019-09-03 | Disposition: A | Discharge status: Home / Self Care | Payer: Medicare HMO | Source: Ambulatory Visit | Attending: Family Medicine | Admitting: Family Medicine

## 2019-09-03 ENCOUNTER — Other Ambulatory Visit: Payer: Self-pay

## 2019-09-03 DIAGNOSIS — Z1231 Encounter for screening mammogram for malignant neoplasm of breast: Secondary | ICD-10-CM

## 2019-09-15 ENCOUNTER — Ambulatory Visit (INDEPENDENT_AMBULATORY_CARE_PROVIDER_SITE_OTHER): Payer: Medicare HMO | Admitting: *Deleted

## 2019-09-15 DIAGNOSIS — I63411 Cerebral infarction due to embolism of right middle cerebral artery: Secondary | ICD-10-CM

## 2019-09-15 LAB — CUP PACEART REMOTE DEVICE CHECK
Date Time Interrogation Session: 20201101235040
Implantable Pulse Generator Implant Date: 20190423

## 2019-10-08 NOTE — Progress Notes (Signed)
Carelink Summary Report / Loop Recorder 

## 2019-10-17 ENCOUNTER — Ambulatory Visit (INDEPENDENT_AMBULATORY_CARE_PROVIDER_SITE_OTHER): Payer: Medicare HMO | Admitting: *Deleted

## 2019-10-17 DIAGNOSIS — Z8673 Personal history of transient ischemic attack (TIA), and cerebral infarction without residual deficits: Secondary | ICD-10-CM | POA: Diagnosis not present

## 2019-10-19 LAB — CUP PACEART REMOTE DEVICE CHECK
Date Time Interrogation Session: 20201204185105
Implantable Pulse Generator Implant Date: 20190423

## 2019-11-13 ENCOUNTER — Telehealth: Payer: Self-pay

## 2019-11-13 NOTE — Telephone Encounter (Signed)
Pt states someone called her about her monitor but I do not see a phone note. I told her it looks like her monitor is transmitting like it supposed to. I apologized for the misunderstanding.

## 2019-11-19 ENCOUNTER — Ambulatory Visit (INDEPENDENT_AMBULATORY_CARE_PROVIDER_SITE_OTHER): Payer: Medicare HMO | Admitting: *Deleted

## 2019-11-19 DIAGNOSIS — Z8673 Personal history of transient ischemic attack (TIA), and cerebral infarction without residual deficits: Secondary | ICD-10-CM | POA: Diagnosis not present

## 2019-11-20 LAB — CUP PACEART REMOTE DEVICE CHECK
Date Time Interrogation Session: 20210106190442
Implantable Pulse Generator Implant Date: 20190423

## 2019-12-06 IMAGING — MR MR HEAD W/O CM
9 of 10 series · 38 of 48 positions shown · non-contrast
Comparison: 02/16/2018

CLINICAL DATA: Confusion, acute, unexplained.

EXAM:
MRI HEAD WITHOUT CONTRAST
TECHNIQUE: Multiplanar, multiecho pulse sequences of the brain and surrounding
structures were obtained without intravenous contrast.

[Series 3: DWI · axial · 3.0mm · 1.09mm/px · z∈[-67,+78]mm · 11 of 102 slices shown (1 of 4)]
[im 1/102]
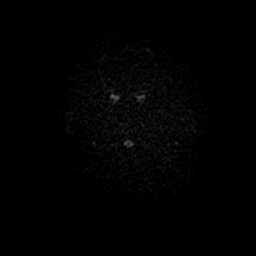
[im 11/102]
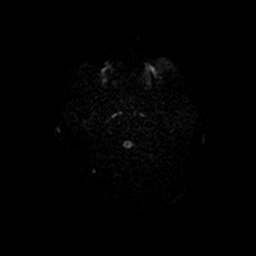
[im 21/102]
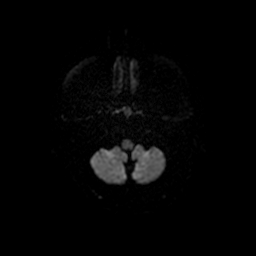
[im 31/102]
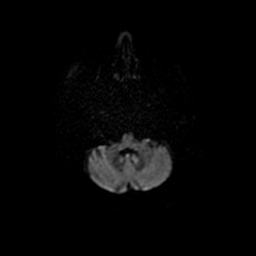
[im 41/102]
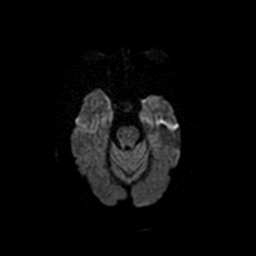
[im 51/102]
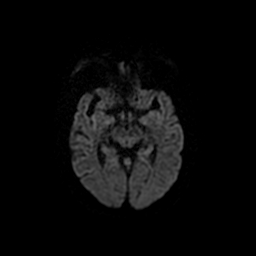
[im 61/102]
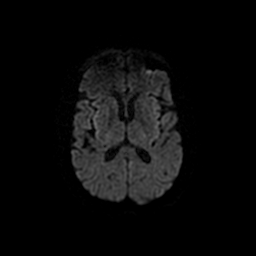
[im 71/102]
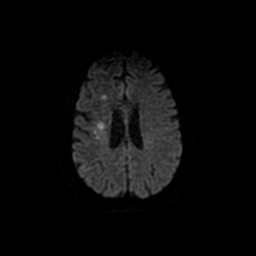
[im 81/102]
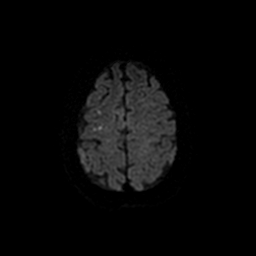
[im 91/102]
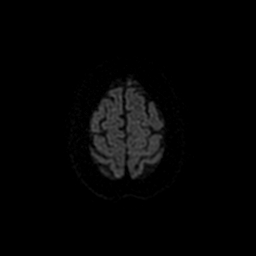
[im 102/102]
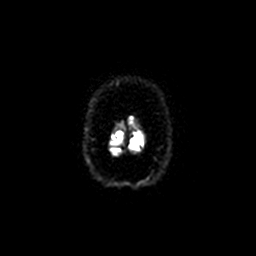

[Series 4: DWI · coronal · 5.0mm · 1.09mm/px · 8 of 80 slices shown (2 of 4)]
[im 1/80]
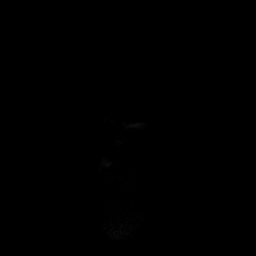
[im 12/80]
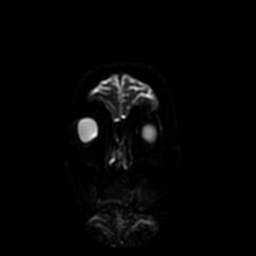
[im 23/80]
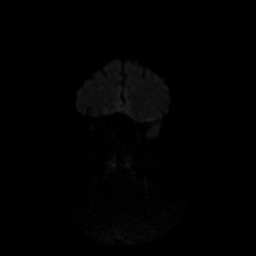
[im 34/80]
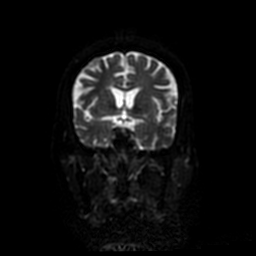
[im 46/80]
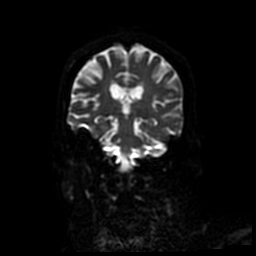
[im 57/80]
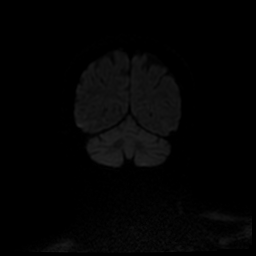
[im 68/80]
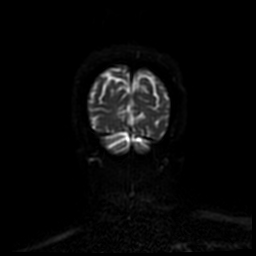
[im 80/80]
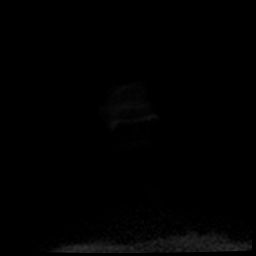

[Series 5: T1 · sagittal · 5.0mm · 0.47mm/px · 2 of 23 slices shown]
[im 1/23]
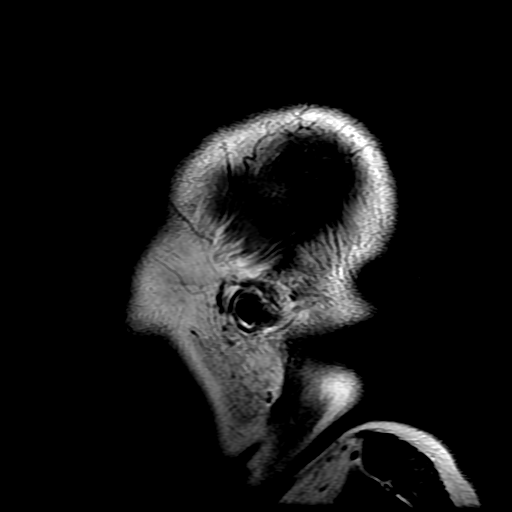
[im 23/23]
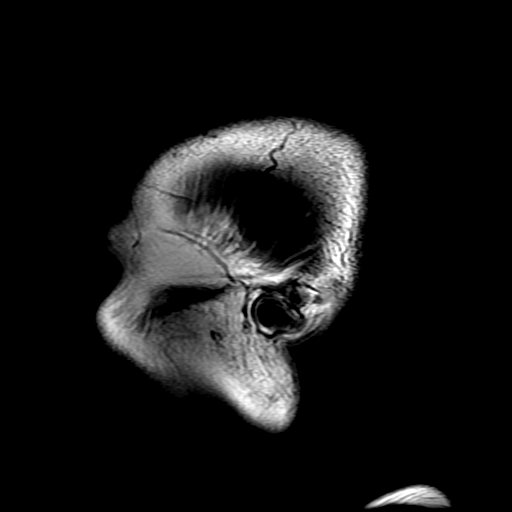

[Series 6: T2 · axial · 5.0mm · 0.43mm/px · z∈[-57,+83]mm · 2 of 25 slices shown (1 of 2)]
[im 1/25]
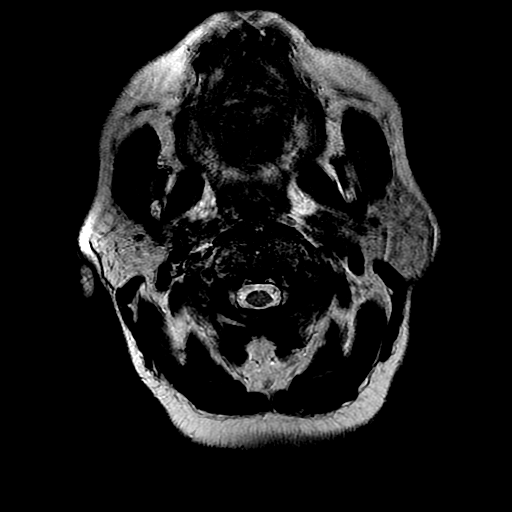
[im 25/25]
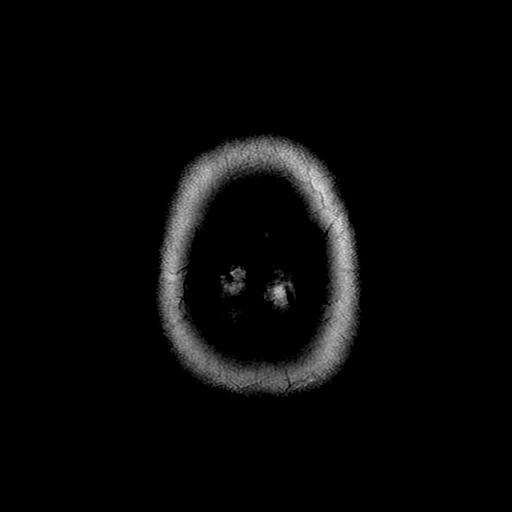

[Series 7: ax mpgr · axial · 5.0mm · 0.43mm/px · z∈[-69,+73]mm · 2 of 22 slices shown]
[im 1/22]
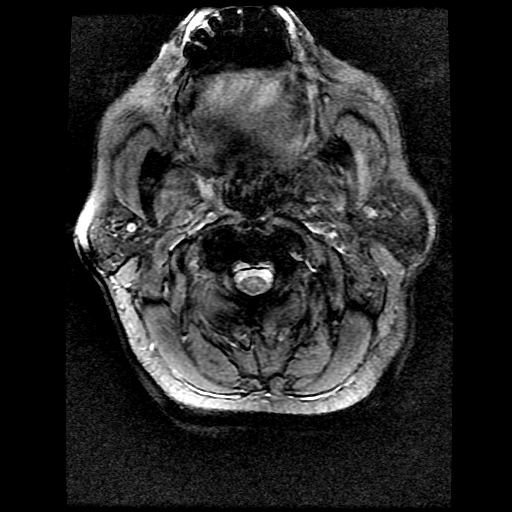
[im 22/22]
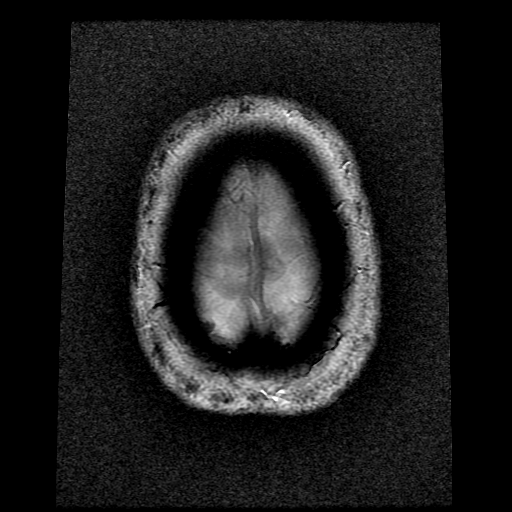

[Series 8: FLAIR · axial · 3.0mm · 0.43mm/px · z∈[-57,+83]mm · 2 of 25 slices shown]
[im 1/25]
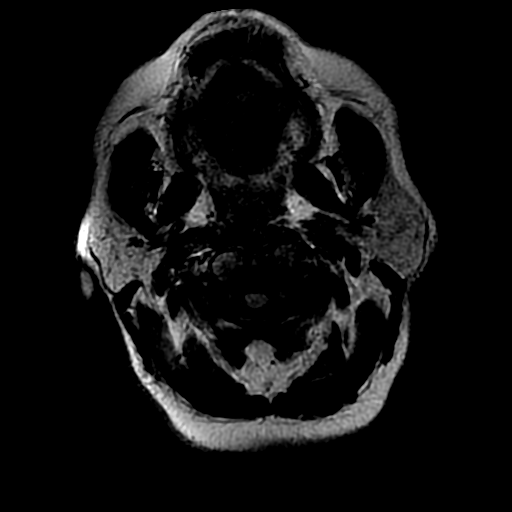
[im 25/25]
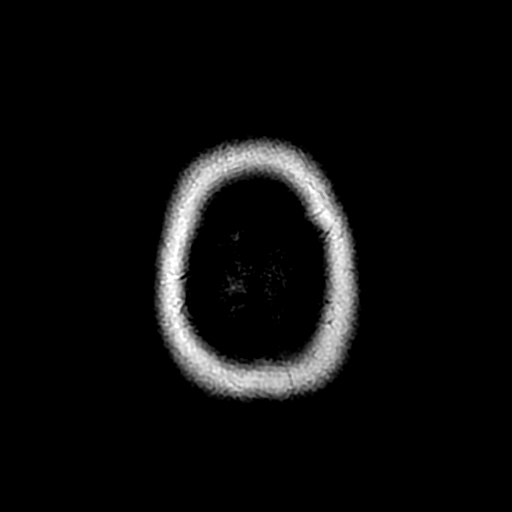

[Series 10: T2 · coronal · 5.0mm · 0.39mm/px · 2 of 23 slices shown (2 of 2)]
[im 1/23]
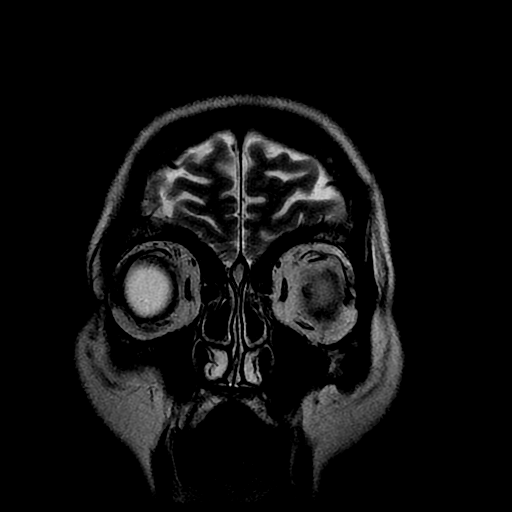
[im 23/23]
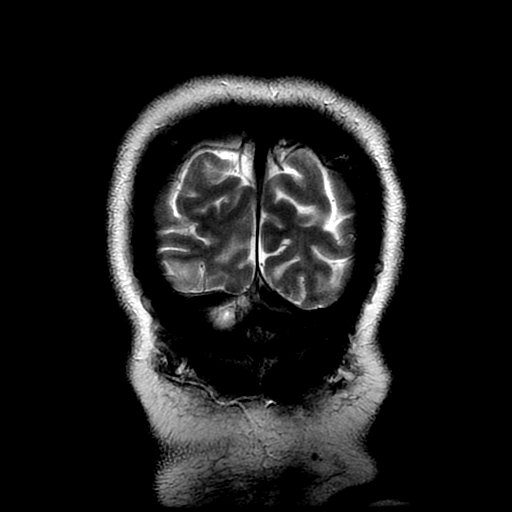

[Series 300: DWI · axial · 3.0mm · 1.09mm/px · z∈[-67,+78]mm · 5 of 51 slices shown (3 of 4)]
[im 1/51]
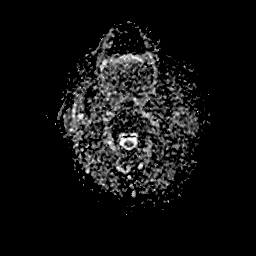
[im 13/51]
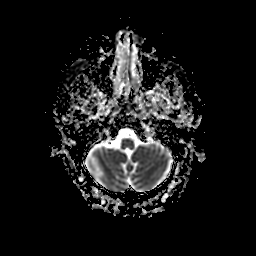
[im 26/51]
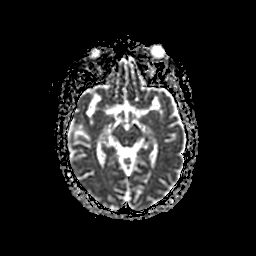
[im 38/51]
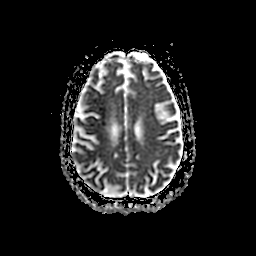
[im 51/51]
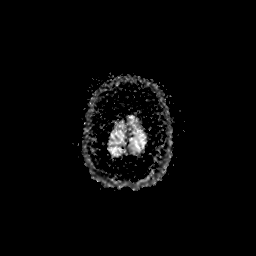

[Series 400: DWI · coronal · 5.0mm · 1.09mm/px · 4 of 40 slices shown (4 of 4)]
[im 1/40]
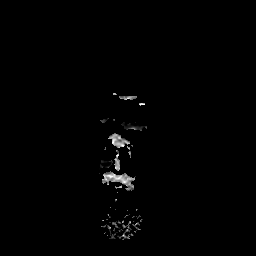
[im 14/40]
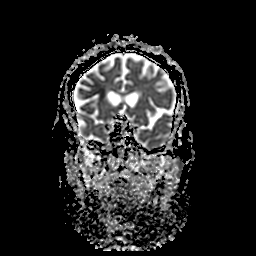
[im 27/40]
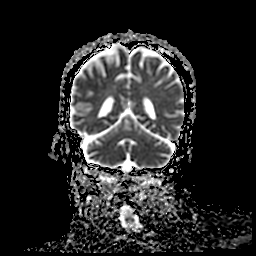
[im 40/40]
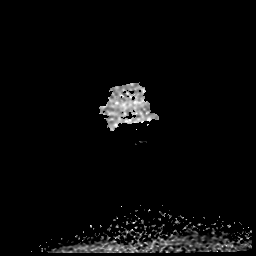

[38 of 48 positions shown; findings below may reference images not displayed]

FINDINGS: Brain: Acute to subacute subcentimeter infarcts in the right corona
radiata and juxta cortical frontal white matter that are more
numerous than seen on comparison. These are all in the right MCA
distribution. Chronic small vessel ischemia with remote lacunar
infarct in the left caudate head. Remote right lateral pontine
infarct. Chronic small vessel ischemic type gliosis in the cerebral
white matter that is comparatively mild. No hemorrhage,
hydrocephalus, or masslike finding. Brain atrophy.

Vascular: Major flow voids are preserved.

Skull and upper cervical spine: No evidence of marrow lesion

Sinuses/Orbits: Negative
IMPRESSION: 1. Small acute to subacute infarcts clustered in the right corona
radiata and frontal white matter that have increased in number from
02/16/2018, suspicious for underlying flow limiting stenosis or
embolic source.
2. Chronic small vessel ischemia with remote right pontine and left
caudate infarct.

## 2019-12-09 ENCOUNTER — Other Ambulatory Visit: Payer: Self-pay | Admitting: Student in an Organized Health Care Education/Training Program

## 2019-12-09 DIAGNOSIS — I10 Essential (primary) hypertension: Secondary | ICD-10-CM

## 2019-12-10 ENCOUNTER — Other Ambulatory Visit: Payer: Self-pay | Admitting: Student in an Organized Health Care Education/Training Program

## 2019-12-10 DIAGNOSIS — I10 Essential (primary) hypertension: Secondary | ICD-10-CM

## 2019-12-22 ENCOUNTER — Ambulatory Visit (INDEPENDENT_AMBULATORY_CARE_PROVIDER_SITE_OTHER): Payer: Medicare HMO | Admitting: *Deleted

## 2019-12-22 DIAGNOSIS — Z8673 Personal history of transient ischemic attack (TIA), and cerebral infarction without residual deficits: Secondary | ICD-10-CM

## 2019-12-22 LAB — CUP PACEART REMOTE DEVICE CHECK
Date Time Interrogation Session: 20210208000316
Implantable Pulse Generator Implant Date: 20190423

## 2019-12-23 NOTE — Progress Notes (Signed)
ILR Remote 

## 2020-01-22 ENCOUNTER — Ambulatory Visit (INDEPENDENT_AMBULATORY_CARE_PROVIDER_SITE_OTHER): Payer: Medicare HMO | Admitting: *Deleted

## 2020-01-22 DIAGNOSIS — Z8673 Personal history of transient ischemic attack (TIA), and cerebral infarction without residual deficits: Secondary | ICD-10-CM

## 2020-01-22 LAB — CUP PACEART REMOTE DEVICE CHECK
Date Time Interrogation Session: 20210311002805
Implantable Pulse Generator Implant Date: 20190423

## 2020-01-23 NOTE — Progress Notes (Signed)
ILR Remote 

## 2020-02-12 ENCOUNTER — Other Ambulatory Visit: Payer: Self-pay | Admitting: Student in an Organized Health Care Education/Training Program

## 2020-02-12 DIAGNOSIS — E1165 Type 2 diabetes mellitus with hyperglycemia: Secondary | ICD-10-CM

## 2020-02-23 ENCOUNTER — Ambulatory Visit (INDEPENDENT_AMBULATORY_CARE_PROVIDER_SITE_OTHER): Payer: Medicare HMO | Admitting: *Deleted

## 2020-02-23 DIAGNOSIS — Z8673 Personal history of transient ischemic attack (TIA), and cerebral infarction without residual deficits: Secondary | ICD-10-CM

## 2020-02-23 LAB — CUP PACEART REMOTE DEVICE CHECK
Date Time Interrogation Session: 20210411033642
Implantable Pulse Generator Implant Date: 20190423

## 2020-02-24 NOTE — Progress Notes (Signed)
ILR Remote 

## 2020-03-25 LAB — CUP PACEART REMOTE DEVICE CHECK
Date Time Interrogation Session: 20210512033504
Implantable Pulse Generator Implant Date: 20190423

## 2020-03-29 ENCOUNTER — Ambulatory Visit (INDEPENDENT_AMBULATORY_CARE_PROVIDER_SITE_OTHER): Payer: Medicare HMO | Admitting: *Deleted

## 2020-03-29 DIAGNOSIS — I63411 Cerebral infarction due to embolism of right middle cerebral artery: Secondary | ICD-10-CM

## 2020-03-30 NOTE — Progress Notes (Signed)
Carelink Summary Report / Loop Recorder 

## 2020-04-05 ENCOUNTER — Encounter: Payer: Self-pay | Admitting: Podiatrist

## 2020-04-05 ENCOUNTER — Other Ambulatory Visit: Payer: Self-pay

## 2020-04-05 ENCOUNTER — Ambulatory Visit (INDEPENDENT_AMBULATORY_CARE_PROVIDER_SITE_OTHER): Payer: Medicare HMO | Admitting: Podiatrist

## 2020-04-05 VITALS — Temp 97.3°F

## 2020-04-05 DIAGNOSIS — L603 Nail dystrophy: Secondary | ICD-10-CM

## 2020-04-05 DIAGNOSIS — E1151 Type 2 diabetes mellitus with diabetic peripheral angiopathy without gangrene: Secondary | ICD-10-CM

## 2020-04-05 NOTE — Progress Notes (Signed)
  Chief Complaint  Patient presents with  . Nail Problem    Thick, long toenails - bilateral.  . Diabetes    Most recent HgbA1c on file = >14. AM glucose today (not fasting) = 199mg /dL per pt.     HPI: Patient is 69 y.o. female who presents today for a new patient diabetic foot  Evaluation and care of long toenails.  She is also on plavix and has been advised not to trim her own nails. She also has a history of PVD.  Patients PCP- Dr. Buzzy Han MD.     Review of Systems  DATA OBTAINED: from patient  GENERAL: Feels well no fevers, no fatigue, no changes in appetite SKIN: No itching, no rashes, no open wounds EYES: No eye pain,no redness, no discharge EARS: No earache,no ringing of ears, NOSE: No congestion, no drainage, no bleeding  MOUTH/THROAT: No mouth pain, No sore throat, No difficulty chewing or swallowing  RESPIRATORY: No cough, no wheezing, no SOB CARDIAC: No chest pain,no heart palpitations, GI: No abdominal pain, No Nausea, no vomiting, no diarrhea, no heartburn or no reflux  GU: No dysuria, no increased frequency or urgency MUSCULOSKELETAL: No unrelieved bone/joint pain,  NEUROLOGIC: Awake, alert, appropriate to situation, No change in mental status. PSYCHIATRIC: No overt anxiety or sadness.No behavior issue.      Physical Exam  GENERAL APPEARANCE: Alert, conversant. Appropriately groomed. No acute distress.   VASCULAR: Pedal pulses nonpalpable DP and PT bilateral.  Capillary refill time is immediate to all digits,  Proximal to distal cooling it warm to warm.  Digital hair growth is absent bilateral   NEUROLOGIC: sensation is intact to 5.07 monofilament at 5/5 sites bilateral.  Light touch is intact bilateral, vibratory sensation intact bilateral, achilles tendon reflex is intact bilateral.   MUSCULOSKELETAL: acceptable muscle strength, tone and stability bilateral.  Intrinsic muscluature intact bilateral.  Range of motion at ankle and first MPJ is  normal bilateral.   DERMATOLOGIC: skin is warm, supple, and dry.  No open lesions noted.  No interdigital maceration noted bilateral.  Area of hyperkeratotis noted submetatarsal 1 left foot.  Digital nails are long and thickened and discolored x 10 bilateral.     Assessment     ICD-10-CM   1. Diabetes mellitus with peripheral angiopathy (HCC)  E11.51   2. 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  She did not require treatment to the hyperkeratotic lesion sub 1 left today Discussed diabetes and dispensed information for diabetes and foot care.  She will return for continued care in 3 months or call if any concerns arise prior to this appointment.

## 2020-04-05 NOTE — Patient Instructions (Signed)
Diabetes Mellitus and Foot Care Foot care is an important part of your health, especially when you have diabetes. Diabetes may cause you to have problems because of poor blood flow (circulation) to your feet and legs, which can cause your skin to:  Become thinner and drier.  Break more easily.  Heal more slowly.  Peel and crack. You may also have nerve damage (neuropathy) in your legs and feet, causing decreased feeling in them. This means that you may not notice minor injuries to your feet that could lead to more serious problems. Noticing and addressing any potential problems early is the best way to prevent future foot problems. How to care for your feet Foot hygiene  Wash your feet daily with warm water and mild soap. Do not use hot water. Then, pat your feet and the areas between your toes until they are completely dry. Do not soak your feet as this can dry your skin.  Trim your toenails straight across. Do not dig under them or around the cuticle. File the edges of your nails with an emery board or nail file.  Apply a moisturizing lotion or petroleum jelly to the skin on your feet and to dry, brittle toenails. Use lotion that does not contain alcohol and is unscented. Do not apply lotion between your toes. Shoes and socks  Wear clean socks or stockings every day. Make sure they are not too tight. Do not wear knee-high stockings since they may decrease blood flow to your legs.  Wear shoes that fit properly and have enough cushioning. Always look in your shoes before you put them on to be sure there are no objects inside.  To break in new shoes, wear them for just a few hours a day. This prevents injuries on your feet. Wounds, scrapes, corns, and calluses  Check your feet daily for blisters, cuts, bruises, sores, and redness. If you cannot see the bottom of your feet, use a mirror or ask someone for help.  Do not cut corns or calluses or try to remove them with medicine.  If you  find a minor scrape, cut, or break in the skin on your feet, keep it and the skin around it clean and dry. You may clean these areas with mild soap and water. Do not clean the area with peroxide, alcohol, or iodine.  If you have a wound, scrape, corn, or callus on your foot, look at it several times a day to make sure it is healing and not infected. Check for: ? Redness, swelling, or pain. ? Fluid or blood. ? Warmth. ? Pus or a bad smell. General instructions  Do not cross your legs. This may decrease blood flow to your feet.  Do not use heating pads or hot water bottles on your feet. They may burn your skin. If you have lost feeling in your feet or legs, you may not know this is happening until it is too late.  Protect your feet from hot and cold by wearing shoes, such as at the beach or on hot pavement.  Schedule a complete foot exam at least once a year (annually) or more often if you have foot problems. If you have foot problems, report any cuts, sores, or bruises to your health care provider immediately. Contact a health care provider if:  You have a medical condition that increases your risk of infection and you have any cuts, sores, or bruises on your feet.  You have an injury that is not   healing.  You have redness on your legs or feet.  You feel burning or tingling in your legs or feet.  You have pain or cramps in your legs and feet.  Your legs or feet are numb.  Your feet always feel cold.  You have pain around a toenail. Get help right away if:  You have a wound, scrape, corn, or callus on your foot and: ? You have pain, swelling, or redness that gets worse. ? You have fluid or blood coming from the wound, scrape, corn, or callus. ? Your wound, scrape, corn, or callus feels warm to the touch. ? You have pus or a bad smell coming from the wound, scrape, corn, or callus. ? You have a fever. ? You have a red line going up your leg. Summary  Check your feet every day  for cuts, sores, red spots, swelling, and blisters.  Moisturize feet and legs daily.  Wear shoes that fit properly and have enough cushioning.  If you have foot problems, report any cuts, sores, or bruises to your health care provider immediately.  Schedule a complete foot exam at least once a year (annually) or more often if you have foot problems. This information is not intended to replace advice given to you by your health care provider. Make sure you discuss any questions you have with your health care provider. Document Revised: 07/23/2019 Document Reviewed: 12/01/2016 Elsevier Patient Education  2020 [employer] Inc.  

## 2020-05-03 ENCOUNTER — Ambulatory Visit (INDEPENDENT_AMBULATORY_CARE_PROVIDER_SITE_OTHER): Payer: Medicare HMO | Admitting: *Deleted

## 2020-05-03 DIAGNOSIS — I63411 Cerebral infarction due to embolism of right middle cerebral artery: Secondary | ICD-10-CM | POA: Diagnosis not present

## 2020-05-03 LAB — CUP PACEART REMOTE DEVICE CHECK
Date Time Interrogation Session: 20210621001802
Implantable Pulse Generator Implant Date: 20190423

## 2020-05-04 NOTE — Progress Notes (Signed)
Carelink Summary Report / Loop Recorder 

## 2020-06-07 ENCOUNTER — Ambulatory Visit (INDEPENDENT_AMBULATORY_CARE_PROVIDER_SITE_OTHER): Payer: Medicare HMO | Admitting: *Deleted

## 2020-06-07 DIAGNOSIS — I63411 Cerebral infarction due to embolism of right middle cerebral artery: Secondary | ICD-10-CM | POA: Diagnosis not present

## 2020-06-08 LAB — CUP PACEART REMOTE DEVICE CHECK
Date Time Interrogation Session: 20210725232954
Implantable Pulse Generator Implant Date: 20190423

## 2020-06-10 NOTE — Progress Notes (Signed)
Carelink Summary Report / Loop Recorder 

## 2020-07-05 ENCOUNTER — Other Ambulatory Visit: Payer: Self-pay

## 2020-07-05 ENCOUNTER — Ambulatory Visit (INDEPENDENT_AMBULATORY_CARE_PROVIDER_SITE_OTHER): Payer: Medicare HMO | Admitting: Podiatrist

## 2020-07-05 DIAGNOSIS — L603 Nail dystrophy: Secondary | ICD-10-CM | POA: Diagnosis not present

## 2020-07-05 DIAGNOSIS — E1151 Type 2 diabetes mellitus with diabetic peripheral angiopathy without gangrene: Secondary | ICD-10-CM

## 2020-07-05 NOTE — Patient Instructions (Signed)
BIOTIN--  Is a supplement to help with nails to make them stronger-  Available at the drug store in the vitamin aisle.

## 2020-07-08 NOTE — Progress Notes (Signed)
Chief Complaint  Patient presents with  . Nail Problem    History of onychodystrophy.  . Diabetes    Most recent HgbA1c on file = Greater than 14.     HPI: Patient is 69 y.o. female who presents today for at risk foot care.  She is diabetic and is on blood thinners and has long and thick toenails.    Patient Active Problem List   Diagnosis Date Noted  . Lymphadenopathy of head and neck 03/25/2019  . CAD (coronary artery disease) 07/24/2018  . Abdominal aortic aneurysm (AAA) without rupture ([place]) 07/24/2018  . Abnormal uterine bleeding 05/22/2018  . 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 ([place]) 02/22/2018  . Gingivitis 10/24/2017  . GERD (gastroesophageal reflux disease) 06/01/2015  . Normocytic anemia 06/01/2015  . Healthcare maintenance 06/01/2015  . Vitamin D deficiency 06/01/2015  . CKD (chronic kidney disease), stage III ([place]) 03/01/2014  . PVD (peripheral vascular disease) ([place]) 06/18/2013  . Hypothyroidism 01/10/2007  . Hyperlipidemia 01/10/2007  . HYPERTENSION, BENIGN SYSTEMIC 01/10/2007  . History of CVA (cerebrovascular accident) 01/10/2007  . Diabetes mellitus with stage 3 chronic kidney disease ([place]) 01/10/1989    Current Outpatient Medications on File Prior to Visit  Medication Sig Dispense Refill  . BD INSULIN SYRINGE ULTRAFINE 31G X 15/64" 0.5 ML MISC USE TO INJECT INSULIN TWO TIMES A DAY 100 each 11  . cilostazol (PLETAL) 100 MG tablet TAKE 1 TABLET BY MOUTH TWICE A DAY 180 tablet 1  . clopidogrel (PLAVIX) 75 MG tablet TAKE 1 TABLET BY MOUTH EVERY DAY IN THE MORNING 90 tablet 3  . D3-1000 25 MCG (1000 UT) capsule TAKE 1 CAPSULE (1,000 UNITS TOTAL) BY MOUTH DAILY. 180 capsule 1  . famotidine (PEPCID) 20 MG tablet TAKE 1 TABLET BY MOUTH DAILY AS NEEDED FOR HEARTBURN OR INDIGESTION. 90 tablet 1  . gemfibrozil (LOPID) 600 MG tablet TAKE 1 TABLET BY MOUTH TWICE A DAY 180 tablet 3  . glucose blood (ACCU-CHEK AVIVA PLUS) test strip 1 each by Other route  2 (two) times daily. Use as instructed 200 each 1  . Insulin Pen Needle (B-D UF III MINI PEN NEEDLES) 31G X 5 MM MISC INJECTS INSULIN 2 TIMES PER DAY. 200 each 3  . Lancets (ACCU-CHEK SOFT TOUCH) lancets Use as instructed 100 each 12  . LANTUS SOLOSTAR 100 UNIT/ML Solostar Pen SMARTSIG:25 Unit(s) SUB-Q Twice Daily    . levothyroxine (SYNTHROID, LEVOTHROID) 88 MCG tablet TAKE 1 TABLET BY MOUTH EVERY DAY BEFORE BREAKFAST 90 tablet 3  . losartan-hydrochlorothiazide (HYZAAR) 100-25 MG tablet Take 1 tablet by mouth daily. 90 tablet 1  . Multiple Vitamins-Minerals (MULTIVITAMIN ADULT PO) Take 1 tablet by mouth once a week. On Wednesday    . pioglitazone (ACTOS) 15 MG tablet TAKE 1 TABLET BY MOUTH EVERY DAY 90 tablet 0   No current facility-administered medications on file prior to visit.    Allergies  Allergen Reactions  . Dilantin [Phenytoin Sodium Extended]     Rash/itching  . Insulins Rash    Humalog 75/25 only  . Statins Nausea Only and Rash    "sick to my stomach"; describes "rash and a lump" while taking statins    GENERAL APPEARANCE: Alert, conversant. Appropriately groomed. No acute distress.    VASCULAR: Pedal pulses nonpalpable DP and PT bilateral.  Capillary refill time is immediate to all digits,  Proximal to distal cooling it warm to warm.  Digital hair growth is absent bilateral    NEUROLOGIC: sensation  is intact to 5.07 monofilament at 5/5 sites bilateral.  Light touch is intact bilateral, vibratory sensation intact bilateral, achilles tendon reflex is intact bilateral.    MUSCULOSKELETAL: acceptable muscle strength, tone and stability bilateral.  Intrinsic muscluature intact bilateral.  Range of motion at ankle and first MPJ is normal bilateral.    DERMATOLOGIC: skin is warm, supple, and dry.  No open lesions noted.  No interdigital maceration noted bilateral.  Area of hyperkeratotis noted submetatarsal 1 left foot.  Digital nails are long and thickened and discolored x 10  bilateral.      Assessment     ICD-10-CM   1. Diabetes mellitus with peripheral angiopathy (HCC)  E11.51   2. 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  Onychoreduction of symptomatic toenails was performed via nail nipper and power burr without iatrogenic incident.  Patient was instructed on signs and symptoms of infection and was told to call immediately should any of these arise. She will return in 3 months.

## 2020-07-12 ENCOUNTER — Ambulatory Visit (INDEPENDENT_AMBULATORY_CARE_PROVIDER_SITE_OTHER): Payer: Medicare HMO | Admitting: *Deleted

## 2020-07-12 DIAGNOSIS — Z8673 Personal history of transient ischemic attack (TIA), and cerebral infarction without residual deficits: Secondary | ICD-10-CM

## 2020-07-12 LAB — CUP PACEART REMOTE DEVICE CHECK
Date Time Interrogation Session: 20210825234428
Implantable Pulse Generator Implant Date: 20190423

## 2020-07-14 NOTE — Progress Notes (Signed)
Carelink Summary Report / Loop Recorder 

## 2020-07-23 ENCOUNTER — Other Ambulatory Visit: Payer: Self-pay | Admitting: Family Medicine

## 2020-07-23 ENCOUNTER — Ambulatory Visit: Payer: Medicare HMO | Admitting: Internal Medicine

## 2020-07-23 DIAGNOSIS — Z1231 Encounter for screening mammogram for malignant neoplasm of breast: Secondary | ICD-10-CM

## 2020-08-16 ENCOUNTER — Ambulatory Visit (INDEPENDENT_AMBULATORY_CARE_PROVIDER_SITE_OTHER): Payer: Medicare HMO

## 2020-08-16 DIAGNOSIS — I63411 Cerebral infarction due to embolism of right middle cerebral artery: Secondary | ICD-10-CM | POA: Diagnosis not present

## 2020-08-16 LAB — CUP PACEART REMOTE DEVICE CHECK
Date Time Interrogation Session: 20210925234402
Implantable Pulse Generator Implant Date: 20190423

## 2020-08-18 NOTE — Progress Notes (Signed)
Carelink Summary Report / Loop Recorder 

## 2020-09-03 ENCOUNTER — Ambulatory Visit
Admission: RE | Admit: 2020-09-03 | Discharge: 2020-09-03 | Disposition: A | Discharge status: Home / Self Care | Payer: Medicare HMO | Source: Ambulatory Visit | Attending: Family Medicine | Admitting: Family Medicine

## 2020-09-03 ENCOUNTER — Other Ambulatory Visit: Payer: Self-pay

## 2020-09-03 DIAGNOSIS — Z1231 Encounter for screening mammogram for malignant neoplasm of breast: Secondary | ICD-10-CM

## 2020-09-08 ENCOUNTER — Ambulatory Visit (INDEPENDENT_AMBULATORY_CARE_PROVIDER_SITE_OTHER): Payer: Medicare HMO

## 2020-09-08 DIAGNOSIS — I63411 Cerebral infarction due to embolism of right middle cerebral artery: Secondary | ICD-10-CM

## 2020-09-08 LAB — CUP PACEART REMOTE DEVICE CHECK
Date Time Interrogation Session: 20211026234805
Implantable Pulse Generator Implant Date: 20190423

## 2020-09-13 NOTE — Progress Notes (Signed)
Carelink Summary Report / Loop Recorder 

## 2020-09-14 ENCOUNTER — Other Ambulatory Visit: Payer: Self-pay | Admitting: Student in an Organized Health Care Education/Training Program

## 2020-09-14 ENCOUNTER — Ambulatory Visit (INDEPENDENT_AMBULATORY_CARE_PROVIDER_SITE_OTHER): Payer: Medicare HMO | Admitting: Internal Medicine

## 2020-09-14 ENCOUNTER — Encounter: Payer: Self-pay | Admitting: Internal Medicine

## 2020-09-14 VITALS — BP 140/70 | HR 90 | Ht 62.0 in | Wt 161.0 lb

## 2020-09-14 DIAGNOSIS — Z794 Long term (current) use of insulin: Secondary | ICD-10-CM

## 2020-09-14 DIAGNOSIS — E1165 Type 2 diabetes mellitus with hyperglycemia: Secondary | ICD-10-CM

## 2020-09-14 DIAGNOSIS — Z86010 Personal history of colonic polyps: Secondary | ICD-10-CM

## 2020-09-14 DIAGNOSIS — Z7902 Long term (current) use of antithrombotics/antiplatelets: Secondary | ICD-10-CM

## 2020-09-14 DIAGNOSIS — E119 Type 2 diabetes mellitus without complications: Secondary | ICD-10-CM | POA: Diagnosis not present

## 2020-09-14 MED ORDER — PLENVU 140 G PO SOLR: 1.0000 | Freq: Once | ORAL | 0 refills | Status: AC

## 2020-09-14 NOTE — Patient Instructions (Addendum)
You have been scheduled for a colonoscopy. Please follow written instructions given to you at your visit today.  Please pick up your prep supplies at the pharmacy within the next 1-3 days. If you use inhalers (even only as needed), please bring them with you on the day of your procedure.  Stay on your Plavix and Pletal prior to the procedure

## 2020-09-14 NOTE — Progress Notes (Signed)
HISTORY OF PRESENT ILLNESS:  [name] is a 69 y.o. female with multiple significant medical problems including hypertension, diabetes mellitus, hypothyroidism, chronic renal insufficiency, coronary artery disease, and prior history of stroke x2.  On chronic Plavix and Pletal therapy.  She presents today regarding surveillance colonoscopy.  Patient previously underwent complete colonoscopy Apr 10, 2017 for routine follow-up screening.  She was found to have 4 adenomatous colon polyps ranging between 2 and 10 mm.  Follow-up in 3 years recommended.  Patient was continued on her antiplatelet agents throughout the procedure due to her high risk of stroke.  She did well.  She presents today to schedule surveillance colonoscopy.  She tells me that her diabetes has been under fair control.  She is on insulin.  No further problems with cerebrovascular disease.  She continues on antiplatelet therapy.  GI review of systems is negative.  She has not received her Covid vaccination.  She tells me that her brother received vaccination and died.  REVIEW OF SYSTEMS:  All non-GI ROS negative unless otherwise stated in the HPI except for night sweats, voice changes  Past Medical History:  Diagnosis Date  . Anemia   . Arteriosclerotic cardiovascular disease   . CKD (chronic kidney disease) stage 3, GFR 30-59 ml/min (HCC)   . Claudication in peripheral vascular disease ([place])   . CVA (cerebral vascular accident) ([place])    2012 and 2019-affected left side + dysarthria 2/2 severe R ACA and MCA stenosis  . GERD (gastroesophageal reflux disease)   . Hyperlipidemia    declines statins  . Hypertension   . Hypothyroidism   . Type 2 diabetes mellitus ([place]) 2000  . Vitamin D deficiency     Past Surgical History:  Procedure Laterality Date  . CESAREAN SECTION    . LEFT HEART CATHETERIZATION WITH CORONARY ANGIOGRAM N/A 03/08/2012   Procedure: LEFT HEART CATHETERIZATION WITH CORONARY ANGIOGRAM;  Surgeon: Sinclair Grooms, MD;  Location: [facility] CATH LAB;  Service: Cardiovascular;  Laterality: N/A;  . LEFT HEART CATHETERIZATION WITH CORONARY ANGIOGRAM N/A 12/17/2012   Procedure: LEFT HEART CATHETERIZATION WITH CORONARY ANGIOGRAM;  Surgeon: Laverda Page, MD;  Location: [facility] CATH LAB;  Service: Cardiovascular;  Laterality: N/A;  . LOOP RECORDER INSERTION N/A 03/05/2018   Procedure: LOOP RECORDER INSERTION;  Surgeon: Evans Lance, MD;  Location: [place] CV LAB;  Service: Cardiovascular;  Laterality: N/A;  . LOWER EXTREMITY ANGIOGRAM N/A 12/17/2012   Procedure: LOWER EXTREMITY ANGIOGRAM;  Surgeon: Laverda Page, MD;  Location: [facility] CATH LAB;  Service: Cardiovascular;  Laterality: N/A;  . TEE WITHOUT CARDIOVERSION N/A 02/28/2018   Procedure: TRANSESOPHAGEAL ECHOCARDIOGRAM (TEE);  Surgeon: Jerline Pain, MD;  Location: [facility] ENDOSCOPY;  Service: Cardiovascular;  Laterality: N/A;  . TUBAL LIGATION      Social History [name]  reports that she quit smoking about 9 years ago. She has never used smokeless tobacco. She reports that she does not drink alcohol and does not use drugs.  family history includes Diabetes in her brother, father, mother, and sister; Prostate cancer in her brother.  Allergies  Allergen Reactions  . Dilantin [Phenytoin Sodium Extended]     Rash/itching  . Insulins Rash    Humalog 75/25 only  . Statins Nausea Only and Rash    "sick to my stomach"; describes "rash and a lump" while taking statins       PHYSICAL EXAMINATION: Vital signs: BP 140/70   Pulse 90   Ht 5\' 2"  (  1.575 m)   Wt 161 lb (73 kg)   BMI 29.45 kg/m   Constitutional: generally well-appearing, no acute distress Psychiatric: alert and oriented x3, cooperative Eyes: extraocular movements intact, anicteric, conjunctiva pink Mouth: oral pharynx moist, no lesions Neck: supple no lymphadenopathy Cardiovascular: heart regular rate and rhythm, no murmur Lungs: clear to auscultation bilaterally Abdomen:  soft, nontender, nondistended, no obvious ascites, no peritoneal signs, normal bowel sounds, no organomegaly Rectal: Deferred until colonoscopy Extremities: no clubbing, cyanosis, or lower extremity edema bilaterally Skin: no lesions on visible extremities Neuro: No focal deficits.  Cranial nerves intact  ASSESSMENT:  1.  History of multiple and advanced adenomatous colon polyps.  Due for surveillance 2.  Multiple comorbidities.  High risk patient.  On Plavix and Pletal   PLAN:  1.  Surveillance colonoscopy.  The patient is HIGH RISK.  This is due to her comorbidities and the need for chronic Plavix therapy.  As well the need to adjust her diabetic medications for the procedure to avoid unwanted hypoglycemia.The nature of the procedure, as well as the risks, benefits, and alternatives were carefully and thoroughly reviewed with the patient. Ample time for discussion and questions allowed. The patient understood, was satisfied, and agreed to proceed. 2.  Stay on Plavix and Pletal throughout.  Risks and benefits of this strategy reviewed.  She understands and agrees. 3.  Adjust insulin preprocedure to avoid unwanted hypoglycemia.  Reviewed

## 2020-09-17 ENCOUNTER — Other Ambulatory Visit: Payer: Self-pay | Admitting: Internal Medicine

## 2020-09-17 LAB — SARS CORONAVIRUS 2 (TAT 6-24 HRS): SARS Coronavirus 2: NEGATIVE

## 2020-09-21 ENCOUNTER — Ambulatory Visit (AMBULATORY_SURGERY_CENTER): Payer: Medicare HMO | Admitting: Internal Medicine

## 2020-09-21 ENCOUNTER — Other Ambulatory Visit: Payer: Self-pay

## 2020-09-21 ENCOUNTER — Encounter: Payer: Self-pay | Admitting: Internal Medicine

## 2020-09-21 VITALS — BP 165/69 | HR 80 | Temp 97.6°F | Resp 16 | Ht 62.0 in | Wt 161.0 lb

## 2020-09-21 DIAGNOSIS — Z860100 Personal history of colon polyps, unspecified: Secondary | ICD-10-CM

## 2020-09-21 DIAGNOSIS — D122 Benign neoplasm of ascending colon: Secondary | ICD-10-CM | POA: Diagnosis not present

## 2020-09-21 HISTORY — PX: COLONOSCOPY: SHX174

## 2020-09-21 MED ORDER — SODIUM CHLORIDE 0.9 % IV SOLN: 500.0000 mL | Freq: Once | INTRAVENOUS | Status: AC

## 2020-09-21 NOTE — Progress Notes (Signed)
pt tolerated well. VSS. awake and to recovery. Report given to RN.  

## 2020-09-21 NOTE — Op Note (Signed)
[place] Patient Name: [name] Procedure Date: 09/21/2020 11:38 AM MRN: [id number] Endoscopist: Docia Chuck. Henrene Pastor MD, MD Age: 69 Referring MD:  Date of Birth: [birth year] Gender: Female Account #: [id number] Procedure:                Colonoscopy with cold snare polypectomy x 2 Indications:              High risk colon cancer surveillance: Personal                            history of multiple (3 or more) adenomas. Negative                            index exam elsewhere 2004. Previous examination                            here 2018 (4 tubular adenomas) Medicines:                Monitored Anesthesia Care Procedure:                Pre-Anesthesia Assessment:                           - Prior to the procedure, a History and Physical                            was performed, and patient medications and                            allergies were reviewed. The patient's tolerance of                            previous anesthesia was also reviewed. The risks                            and benefits of the procedure and the sedation                            options and risks were discussed with the patient.                            All questions were answered, and informed consent                            was obtained. Prior Anticoagulants: The patient has                            taken Plavix (clopidogrel), last dose was 1 day                            prior to procedure. ASA Grade Assessment: III - A                            patient with severe systemic disease. After  reviewing the risks and benefits, the patient was                            deemed in satisfactory condition to undergo the                            procedure.                           After obtaining informed consent, the colonoscope                            was passed under direct vision. Throughout the                            procedure, the patient's blood pressure,  pulse, and                            oxygen saturations were monitored continuously. The                            Colonoscope was introduced through the anus and                            advanced to the the cecum, identified by                            appendiceal orifice and ileocecal valve. The                            ileocecal valve, appendiceal orifice, and rectum                            were photographed. The quality of the bowel                            preparation was excellent. The colonoscopy was                            performed without difficulty. The patient tolerated                            the procedure well. The bowel preparation used was                            SUPREP via split dose instruction. Scope In: 11:57:05 AM Scope Out: 12:09:47 PM Scope Withdrawal Time: 0 hours 9 minutes 25 seconds  Total Procedure Duration: 0 hours 12 minutes 42 seconds  Findings:                 Two polyps were found in the ascending colon. The                            polyps were 2 to 3 mm in size. These polyps were  removed with a cold snare. Resection and retrieval                            were complete.                           The exam was otherwise without abnormality on                            direct and retroflexion views. Complications:            No immediate complications. Estimated blood loss:                            None. Estimated Blood Loss:     Estimated blood loss: none. Impression:               - Two 2 to 3 mm polyps in the ascending colon,                            removed with a cold snare. Resected and retrieved.                           - The examination was otherwise normal on direct                            and retroflexion views. Recommendation:           - Repeat colonoscopy in 5 years for surveillance.                           - Resume Plavix (clopidogrel) today and Pletal                             (cilostazol) today at prior doses.                           - Patient has a contact number available for                            emergencies. The signs and symptoms of potential                            delayed complications were discussed with the                            patient. Return to normal activities tomorrow.                            Written discharge instructions were provided to the                            patient.                           - Resume previous diet.                           -  Continue present medications.                           - Await pathology results. Docia Chuck. Henrene Pastor MD, MD 09/21/2020 12:16:29 PM This report has been signed electronically.

## 2020-09-21 NOTE — Patient Instructions (Signed)
Discharge instructions given. Handout on polyps. Resume previous medications. Resume Plavix today and Pletal today at prior doses. YOU HAD AN ENDOSCOPIC PROCEDURE TODAY AT [facility]:   Refer to the procedure report that was given to you for any specific questions about what was found during the examination.  If the procedure report does not answer your questions, please call your gastroenterologist to clarify.  If you requested that your care partner not be given the details of your procedure findings, then the procedure report has been included in a sealed envelope for you to review at your convenience later.  YOU SHOULD EXPECT: Some feelings of bloating in the abdomen. Passage of more gas than usual.  Walking can help get rid of the air that was put into your GI tract during the procedure and reduce the bloating. If you had a lower endoscopy (such as a colonoscopy or flexible sigmoidoscopy) you may notice spotting of blood in your stool or on the toilet paper. If you underwent a bowel prep for your procedure, you may not have a normal bowel movement for a few days.  Please Note:  You might notice some irritation and congestion in your nose or some drainage.  This is from the oxygen used during your procedure.  There is no need for concern and it should clear up in a day or so.  SYMPTOMS TO REPORT IMMEDIATELY:   Following lower endoscopy (colonoscopy or flexible sigmoidoscopy):  Excessive amounts of blood in the stool  Significant tenderness or worsening of abdominal pains  Swelling of the abdomen that is new, acute  Fever of 100F or higher   For urgent or emergent issues, a gastroenterologist can be reached at any hour by calling [phone]. Do not use MyChart messaging for urgent concerns.    DIET:  We do recommend a small meal at first, but then you may proceed to your regular diet.  Drink plenty of fluids but you should avoid alcoholic beverages for 24  hours.  ACTIVITY:  You should plan to take it easy for the rest of today and you should NOT DRIVE or use heavy machinery until tomorrow (because of the sedation medicines used during the test).    FOLLOW UP: Our staff will call the number listed on your records 48-72 hours following your procedure to check on you and address any questions or concerns that you may have regarding the information given to you following your procedure. If we do not reach you, we will leave a message.  We will attempt to reach you two times.  During this call, we will ask if you have developed any symptoms of COVID 19. If you develop any symptoms (ie: fever, flu-like symptoms, shortness of breath, cough etc.) before then, please call [phone].  If you test positive for Covid 19 in the 2 weeks post procedure, please call and report this information to [place].    If any biopsies were taken you will be contacted by phone or by letter within the next 1-3 weeks.  Please call us at [phone] if you have not heard about the biopsies in 3 weeks.    SIGNATURES/CONFIDENTIALITY: You and/or your care partner have signed paperwork which will be entered into your electronic medical record.  These signatures attest to the fact that that the information above on your After Visit Summary has been reviewed and is understood.  Full responsibility of the confidentiality of this discharge information lies with you and/or your care-partner.

## 2020-09-21 NOTE — Progress Notes (Signed)
Called to room to assist during endoscopic procedure.  Patient ID and intended procedure confirmed with present staff. Received instructions for my participation in the procedure from the performing physician.  

## 2020-09-23 ENCOUNTER — Telehealth: Payer: Self-pay | Admitting: *Deleted

## 2020-09-23 ENCOUNTER — Telehealth: Payer: Self-pay

## 2020-09-23 NOTE — Telephone Encounter (Signed)
  Follow up Call-  Call back number 09/21/2020  Post procedure Call Back phone  # [phone]  Permission to leave phone message Yes  Some recent data might be hidden     Patient questions:  Do you have a fever, pain , or abdominal swelling? No. Pain Score  0 *  Have you tolerated food without any problems? Yes.    Have you been able to return to your normal activities? Yes.    Do you have any questions about your discharge instructions: Diet   No. Medications  No. Follow up visit  No.  Do you have questions or concerns about your Care? No.  Actions: * If pain score is 4 or above: No action needed, pain <4.  1. Have you developed a fever since your procedure? no  2.   Have you had an respiratory symptoms (SOB or cough) since your procedure? no  3.   Have you tested positive for COVID 19 since your procedure no  4.   Have you had any family members/close contacts diagnosed with the COVID 19 since your procedure?  no   If yes to any of these questions please route to Joylene John, RN and Joella Prince, RN

## 2020-09-23 NOTE — Telephone Encounter (Signed)
Left message on f/u call 

## 2020-09-27 ENCOUNTER — Encounter: Payer: Self-pay | Admitting: Internal Medicine

## 2020-10-10 LAB — CUP PACEART REMOTE DEVICE CHECK
Date Time Interrogation Session: 20211126224905
Implantable Pulse Generator Implant Date: 20190423

## 2020-10-11 ENCOUNTER — Ambulatory Visit (INDEPENDENT_AMBULATORY_CARE_PROVIDER_SITE_OTHER): Payer: Medicare HMO

## 2020-10-11 DIAGNOSIS — I63411 Cerebral infarction due to embolism of right middle cerebral artery: Secondary | ICD-10-CM | POA: Diagnosis not present

## 2020-10-14 ENCOUNTER — Ambulatory Visit (INDEPENDENT_AMBULATORY_CARE_PROVIDER_SITE_OTHER): Payer: Medicare HMO | Admitting: Podiatry

## 2020-10-14 ENCOUNTER — Other Ambulatory Visit: Payer: Self-pay

## 2020-10-14 ENCOUNTER — Ambulatory Visit: Payer: Medicare HMO | Admitting: Podiatrist

## 2020-10-14 DIAGNOSIS — M79675 Pain in left toe(s): Secondary | ICD-10-CM

## 2020-10-14 DIAGNOSIS — I739 Peripheral vascular disease, unspecified: Secondary | ICD-10-CM

## 2020-10-14 DIAGNOSIS — B351 Tinea unguium: Secondary | ICD-10-CM | POA: Diagnosis not present

## 2020-10-14 DIAGNOSIS — N183 Chronic kidney disease, stage 3 unspecified: Secondary | ICD-10-CM

## 2020-10-14 DIAGNOSIS — E1122 Type 2 diabetes mellitus with diabetic chronic kidney disease: Secondary | ICD-10-CM

## 2020-10-14 DIAGNOSIS — M79674 Pain in right toe(s): Secondary | ICD-10-CM

## 2020-10-14 DIAGNOSIS — L84 Corns and callosities: Secondary | ICD-10-CM

## 2020-10-17 NOTE — Progress Notes (Signed)
  Subjective:  Patient ID: [name], female    DOB: 1951/10/23,  MRN: [id number]  Chief Complaint  Patient presents with  . routine foot care    nail trim     69 y.o. female presents with the above complaint. History confirmed with patient.  She is diabetic.  Reports no new issues.  Reports good blood sugar control recently.  Objective:  Physical Exam: warm, good capillary refill, no trophic changes or ulcerative lesions, normal DP and PT pulses and normal sensory exam.  Onychomycosis x10.  Callus submet 1 left foot  Assessment:  No diagnosis found.   Plan:  Patient was evaluated and treated and all questions answered.  Patient educated on diabetes. Discussed proper diabetic foot care and discussed risks and complications of disease. Educated patient in depth on reasons to return to the office immediately should he/she discover anything concerning or new on the feet. All questions answered. Discussed proper shoes as well.   Discussed the etiology and treatment options for the condition in detail with the patient. Educated patient on the topical and oral treatment options for mycotic nails. Recommended debridement of the nails today. Sharp and mechanical debridement performed of all painful and mycotic nails today. Nails debrided in length and thickness using a nail nipper and a mechanical burr to level of comfort. Discussed treatment options including appropriate shoe gear. Follow up as needed for painful nails.   All symptomatic hyperkeratoses were safely debrided with a sterile #15 blade to patient's level of comfort without incident. We discussed preventative and palliative care of these lesions including supportive and accommodative shoegear, padding, prefabricated and custom molded accommodative orthoses, use of a pumice stone and lotions/creams daily.  I recommended she use urea cream daily on the callus  Return in about 3 months (around 01/12/2021) for [facility].

## 2020-10-18 NOTE — Progress Notes (Signed)
Carelink Summary Report / Loop Recorder 

## 2020-11-10 LAB — CUP PACEART REMOTE DEVICE CHECK
Date Time Interrogation Session: 20211227225041
Implantable Pulse Generator Implant Date: 20190423

## 2020-11-15 ENCOUNTER — Ambulatory Visit (INDEPENDENT_AMBULATORY_CARE_PROVIDER_SITE_OTHER): Payer: Medicare Other

## 2020-11-15 DIAGNOSIS — I63411 Cerebral infarction due to embolism of right middle cerebral artery: Secondary | ICD-10-CM | POA: Diagnosis not present

## 2020-11-30 NOTE — Progress Notes (Signed)
Carelink Summary Report / Loop Recorder 

## 2020-12-10 ENCOUNTER — Ambulatory Visit (INDEPENDENT_AMBULATORY_CARE_PROVIDER_SITE_OTHER): Payer: Medicare Other

## 2020-12-10 DIAGNOSIS — I63411 Cerebral infarction due to embolism of right middle cerebral artery: Secondary | ICD-10-CM

## 2020-12-10 LAB — CUP PACEART REMOTE DEVICE CHECK
Date Time Interrogation Session: 20220127225558
Implantable Pulse Generator Implant Date: 20190423

## 2020-12-18 NOTE — Progress Notes (Signed)
Carelink Summary Report / Loop Recorder 

## 2021-01-10 ENCOUNTER — Ambulatory Visit (INDEPENDENT_AMBULATORY_CARE_PROVIDER_SITE_OTHER): Payer: Medicare Other

## 2021-01-10 DIAGNOSIS — Z8673 Personal history of transient ischemic attack (TIA), and cerebral infarction without residual deficits: Secondary | ICD-10-CM | POA: Diagnosis not present

## 2021-01-10 LAB — CUP PACEART REMOTE DEVICE CHECK
Date Time Interrogation Session: 20220227225615
Implantable Pulse Generator Implant Date: 20190423

## 2021-01-18 NOTE — Progress Notes (Signed)
Carelink Summary Report / Loop Recorder 

## 2021-02-01 ENCOUNTER — Ambulatory Visit (INDEPENDENT_AMBULATORY_CARE_PROVIDER_SITE_OTHER): Payer: Medicare Other | Admitting: Podiatry

## 2021-02-01 ENCOUNTER — Other Ambulatory Visit: Payer: Self-pay

## 2021-02-01 DIAGNOSIS — E1122 Type 2 diabetes mellitus with diabetic chronic kidney disease: Secondary | ICD-10-CM

## 2021-02-01 DIAGNOSIS — M79674 Pain in right toe(s): Secondary | ICD-10-CM | POA: Diagnosis not present

## 2021-02-01 DIAGNOSIS — N183 Chronic kidney disease, stage 3 unspecified: Secondary | ICD-10-CM

## 2021-02-01 DIAGNOSIS — B351 Tinea unguium: Secondary | ICD-10-CM

## 2021-02-01 DIAGNOSIS — M216X2 Other acquired deformities of left foot: Secondary | ICD-10-CM

## 2021-02-01 DIAGNOSIS — M2141 Flat foot [pes planus] (acquired), right foot: Secondary | ICD-10-CM

## 2021-02-01 DIAGNOSIS — M2142 Flat foot [pes planus] (acquired), left foot: Secondary | ICD-10-CM

## 2021-02-01 DIAGNOSIS — L84 Corns and callosities: Secondary | ICD-10-CM

## 2021-02-01 DIAGNOSIS — E1151 Type 2 diabetes mellitus with diabetic peripheral angiopathy without gangrene: Secondary | ICD-10-CM | POA: Diagnosis not present

## 2021-02-01 DIAGNOSIS — M79675 Pain in left toe(s): Secondary | ICD-10-CM

## 2021-02-02 NOTE — Progress Notes (Signed)
  Subjective:  Patient ID: [name], female    DOB: 16-Oct-1951,  MRN: [id number]  Chief Complaint  Patient presents with  . debride    DFC -FBS: 136 x 1 day A1C: unknown PCP: [place] x 1 wk     70 y.o. female presents with the above complaint. History confirmed with patient.  She is diabetic.  Reports no new issues.  Reports good blood sugar control recently.  Objective:  Physical Exam: warm, good capillary refill, no trophic changes or ulcerative lesions, normal DP and PT pulses and normal sensory exam.  Onychomycosis x10.  Callus submet 1 left foot  Assessment:   1. Diabetes mellitus with peripheral angiopathy (HCC)   2. Pain due to onychomycosis of toenails of both feet   3. Diabetes mellitus with stage 3 chronic kidney disease ([place])   4. Pre-ulcerative calluses   5. Pes planus of both feet   6. 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  Patient educated on diabetes. Discussed proper diabetic foot care and discussed risks and complications of disease. Educated patient in depth on reasons to return to the office immediately should he/she discover anything concerning or new on the feet. All questions answered. Discussed proper shoes as well.   Discussed the etiology and treatment options for the condition in detail with the patient. Educated patient on the topical and oral treatment options for mycotic nails. Recommended debridement of the nails today. Sharp and mechanical debridement performed of all painful and mycotic nails today. Nails debrided in length and thickness using a nail nipper and a mechanical burr to level of comfort. Discussed treatment options including appropriate shoe gear. Follow up as needed for painful nails.   All symptomatic hyperkeratoses were safely debrided with a sterile #15 blade to patient's level of comfort without incident. We discussed preventative and palliative  care of these lesions including supportive and accommodative shoegear, padding, prefabricated and custom molded accommodative orthoses, use of a pumice stone and lotions/creams daily.  I recommended she use urea cream daily on the callus Offloading pad has been helpful.  She will be fitted for new diabetic shoes  Return in about 3 months (around 05/04/2021) for at risk diabetic foot care.

## 2021-02-10 ENCOUNTER — Ambulatory Visit (INDEPENDENT_AMBULATORY_CARE_PROVIDER_SITE_OTHER): Payer: Medicare Other

## 2021-02-10 DIAGNOSIS — Z8673 Personal history of transient ischemic attack (TIA), and cerebral infarction without residual deficits: Secondary | ICD-10-CM

## 2021-02-10 LAB — CUP PACEART REMOTE DEVICE CHECK
Date Time Interrogation Session: 20220330235755
Implantable Pulse Generator Implant Date: 20190423

## 2021-02-23 NOTE — Progress Notes (Signed)
Carelink Summary Report / Loop Recorder 

## 2021-03-02 ENCOUNTER — Ambulatory Visit (INDEPENDENT_AMBULATORY_CARE_PROVIDER_SITE_OTHER): Payer: Medicare Other | Admitting: Podiatry

## 2021-03-02 ENCOUNTER — Other Ambulatory Visit: Payer: Self-pay

## 2021-03-02 DIAGNOSIS — M2142 Flat foot [pes planus] (acquired), left foot: Secondary | ICD-10-CM

## 2021-03-02 DIAGNOSIS — M2141 Flat foot [pes planus] (acquired), right foot: Secondary | ICD-10-CM

## 2021-03-02 DIAGNOSIS — E1151 Type 2 diabetes mellitus with diabetic peripheral angiopathy without gangrene: Secondary | ICD-10-CM

## 2021-03-02 DIAGNOSIS — M216X2 Other acquired deformities of left foot: Secondary | ICD-10-CM

## 2021-03-02 NOTE — Progress Notes (Signed)
Patient presented for foam casting for 3 pair custom diabetic shoe inserts. Patient is measured with a [place] Device to be a size 10 1/2 x-wide  Diabetic shoes are chosen from the [employer] catalog.  The shoes chosen are [phone]  The patient will be contacted when the shoes and inserts are ready for pick up.

## 2021-03-14 ENCOUNTER — Ambulatory Visit (INDEPENDENT_AMBULATORY_CARE_PROVIDER_SITE_OTHER): Payer: Medicare Other

## 2021-03-14 DIAGNOSIS — Z8673 Personal history of transient ischemic attack (TIA), and cerebral infarction without residual deficits: Secondary | ICD-10-CM

## 2021-03-15 LAB — CUP PACEART REMOTE DEVICE CHECK
Date Time Interrogation Session: 20220501000225
Implantable Pulse Generator Implant Date: 20190423

## 2021-04-04 NOTE — Progress Notes (Signed)
Carelink Summary Report / Loop Recorder 

## 2021-04-13 LAB — CUP PACEART REMOTE DEVICE CHECK
Date Time Interrogation Session: 20220601002940
Implantable Pulse Generator Implant Date: 20190423

## 2021-04-14 ENCOUNTER — Ambulatory Visit (INDEPENDENT_AMBULATORY_CARE_PROVIDER_SITE_OTHER): Payer: Medicare Other

## 2021-04-14 DIAGNOSIS — Z8673 Personal history of transient ischemic attack (TIA), and cerebral infarction without residual deficits: Secondary | ICD-10-CM

## 2021-05-05 ENCOUNTER — Ambulatory Visit: Payer: Medicare Other | Admitting: Podiatry

## 2021-05-09 NOTE — Progress Notes (Signed)
Carelink Summary Report / Loop Recorder 

## 2021-05-17 ENCOUNTER — Ambulatory Visit (INDEPENDENT_AMBULATORY_CARE_PROVIDER_SITE_OTHER): Payer: Medicare Other

## 2021-05-17 DIAGNOSIS — Z8673 Personal history of transient ischemic attack (TIA), and cerebral infarction without residual deficits: Secondary | ICD-10-CM

## 2021-05-18 LAB — CUP PACEART REMOTE DEVICE CHECK
Date Time Interrogation Session: 20220702003241
Implantable Pulse Generator Implant Date: 20190423

## 2021-06-07 NOTE — Progress Notes (Signed)
Carelink Summary Report / Loop Recorder 

## 2021-06-15 LAB — CUP PACEART REMOTE DEVICE CHECK
Date Time Interrogation Session: 20220802005121
Implantable Pulse Generator Implant Date: 20190423

## 2021-06-16 ENCOUNTER — Ambulatory Visit (INDEPENDENT_AMBULATORY_CARE_PROVIDER_SITE_OTHER): Payer: Medicare Other

## 2021-06-16 DIAGNOSIS — Z8673 Personal history of transient ischemic attack (TIA), and cerebral infarction without residual deficits: Secondary | ICD-10-CM

## 2021-06-21 ENCOUNTER — Other Ambulatory Visit: Payer: Self-pay

## 2021-06-21 ENCOUNTER — Ambulatory Visit (INDEPENDENT_AMBULATORY_CARE_PROVIDER_SITE_OTHER): Payer: Medicare Other | Admitting: Podiatry

## 2021-06-21 DIAGNOSIS — E1151 Type 2 diabetes mellitus with diabetic peripheral angiopathy without gangrene: Secondary | ICD-10-CM | POA: Diagnosis not present

## 2021-06-21 DIAGNOSIS — M79675 Pain in left toe(s): Secondary | ICD-10-CM | POA: Diagnosis not present

## 2021-06-21 DIAGNOSIS — B351 Tinea unguium: Secondary | ICD-10-CM

## 2021-06-21 DIAGNOSIS — L84 Corns and callosities: Secondary | ICD-10-CM

## 2021-06-21 DIAGNOSIS — M79674 Pain in right toe(s): Secondary | ICD-10-CM | POA: Diagnosis not present

## 2021-06-22 NOTE — Progress Notes (Signed)
  Subjective:  Patient ID: [name], female    DOB: 1951/06/25,  MRN: [id number]  Chief Complaint  Patient presents with   Diabetes    diabetic foot care   Peripheral Neuropathy    71 y.o. female presents with the above complaint. History confirmed with patient.  She is diabetic.  Reports no new issues.  Reports good blood sugar control recently.  Objective:  Physical Exam: warm, good capillary refill, no trophic changes or ulcerative lesions, normal DP and PT pulses and normal sensory exam.  Onychomycosis x10.  Callus submet 1 left foot  Assessment:   1. Pain due to onychomycosis of toenails of both feet   2. Callus of foot   3. Diabetes mellitus with peripheral angiopathy ([place])      Plan:  Patient was evaluated and treated and all questions answered.  Patient educated on diabetes. Discussed proper diabetic foot care and discussed risks and complications of disease. Educated patient in depth on reasons to return to the office immediately should he/she discover anything concerning or new on the feet. All questions answered. Discussed proper shoes as well.   Discussed the etiology and treatment options for the condition in detail with the patient. Educated patient on the topical and oral treatment options for mycotic nails. Recommended debridement of the nails today. Sharp and mechanical debridement performed of all painful and mycotic nails today. Nails debrided in length and thickness using a nail nipper and a mechanical burr to level of comfort. Discussed treatment options including appropriate shoe gear. Follow up as needed for painful nails.   All symptomatic hyperkeratoses were safely debrided with a sterile #15 blade to patient's level of comfort without incident. We discussed preventative and palliative care of these lesions including supportive and accommodative shoegear, padding, prefabricated and custom molded accommodative orthoses, use of a pumice stone and  lotions/creams daily.  I recommended she use urea cream daily on the callus Offloading pad has been helpful.    Return in about 12 weeks (around 09/13/2021) for at risk diabetic foot care.

## 2021-07-11 NOTE — Progress Notes (Signed)
Carelink Summary Report / Loop Recorder 

## 2021-07-19 ENCOUNTER — Ambulatory Visit (INDEPENDENT_AMBULATORY_CARE_PROVIDER_SITE_OTHER): Payer: Medicare Other

## 2021-07-19 DIAGNOSIS — Z8673 Personal history of transient ischemic attack (TIA), and cerebral infarction without residual deficits: Secondary | ICD-10-CM

## 2021-07-19 LAB — CUP PACEART REMOTE DEVICE CHECK
Date Time Interrogation Session: 20220902005216
Implantable Pulse Generator Implant Date: 20190423

## 2021-07-21 ENCOUNTER — Telehealth: Payer: Self-pay | Admitting: Podiatry

## 2021-07-21 NOTE — Telephone Encounter (Signed)
Left message for pt to call to schedule an appt to pick up diabetic shoes and inserts after next week. Next available is 9.22 or 9.23

## 2021-07-28 NOTE — Progress Notes (Signed)
Carelink Summary Report / Loop Recorder 

## 2021-08-02 ENCOUNTER — Other Ambulatory Visit: Payer: Self-pay | Admitting: Family Medicine

## 2021-08-02 DIAGNOSIS — Z1231 Encounter for screening mammogram for malignant neoplasm of breast: Secondary | ICD-10-CM

## 2021-08-10 ENCOUNTER — Ambulatory Visit (INDEPENDENT_AMBULATORY_CARE_PROVIDER_SITE_OTHER): Payer: Medicare Other

## 2021-08-10 ENCOUNTER — Other Ambulatory Visit: Payer: Self-pay

## 2021-08-10 DIAGNOSIS — M2011 Hallux valgus (acquired), right foot: Secondary | ICD-10-CM | POA: Diagnosis not present

## 2021-08-10 DIAGNOSIS — M2012 Hallux valgus (acquired), left foot: Secondary | ICD-10-CM | POA: Diagnosis not present

## 2021-08-10 DIAGNOSIS — E1151 Type 2 diabetes mellitus with diabetic peripheral angiopathy without gangrene: Secondary | ICD-10-CM | POA: Diagnosis not present

## 2021-08-10 DIAGNOSIS — E1122 Type 2 diabetes mellitus with diabetic chronic kidney disease: Secondary | ICD-10-CM

## 2021-08-10 DIAGNOSIS — N183 Chronic kidney disease, stage 3 unspecified: Secondary | ICD-10-CM

## 2021-08-10 NOTE — Progress Notes (Signed)
Patient presented for diabetic shoe and custom orthotics pick up.  Patient reported the shoes seem slightly too big but with the insert it helped fill shoe in. I informed her if she would like to send the current 10.5 shoes back and exchange them for a size 10, patient denied and left with the 10.5 today. Patient was informed of the delivery documentation and break-in instruction form and ABN.

## 2021-08-16 LAB — CUP PACEART REMOTE DEVICE CHECK
Date Time Interrogation Session: 20221003005856
Implantable Pulse Generator Implant Date: 20190423

## 2021-08-18 ENCOUNTER — Ambulatory Visit (INDEPENDENT_AMBULATORY_CARE_PROVIDER_SITE_OTHER): Payer: Medicare Other

## 2021-08-18 DIAGNOSIS — Z8673 Personal history of transient ischemic attack (TIA), and cerebral infarction without residual deficits: Secondary | ICD-10-CM | POA: Diagnosis not present

## 2021-08-29 ENCOUNTER — Other Ambulatory Visit: Payer: Self-pay | Admitting: Internal Medicine

## 2021-08-29 NOTE — Progress Notes (Signed)
Carelink Summary Report / Loop Recorder 

## 2021-09-07 ENCOUNTER — Inpatient Hospital Stay: Admission: RE | Admit: 2021-09-07 | Payer: Medicare Other | Source: Ambulatory Visit

## 2021-09-13 ENCOUNTER — Ambulatory Visit (INDEPENDENT_AMBULATORY_CARE_PROVIDER_SITE_OTHER): Payer: Medicare Other | Admitting: Podiatry

## 2021-09-13 ENCOUNTER — Other Ambulatory Visit: Payer: Self-pay

## 2021-09-13 DIAGNOSIS — E1151 Type 2 diabetes mellitus with diabetic peripheral angiopathy without gangrene: Secondary | ICD-10-CM | POA: Diagnosis not present

## 2021-09-13 DIAGNOSIS — M79674 Pain in right toe(s): Secondary | ICD-10-CM

## 2021-09-13 DIAGNOSIS — B351 Tinea unguium: Secondary | ICD-10-CM | POA: Diagnosis not present

## 2021-09-13 DIAGNOSIS — M79675 Pain in left toe(s): Secondary | ICD-10-CM | POA: Diagnosis not present

## 2021-09-13 NOTE — Progress Notes (Signed)
  Subjective:  Patient ID: [name], female    DOB: 28-Jan-1951,  MRN: [id number]  Chief Complaint  Patient presents with   Nail Problem    Thick painful toenails, 3 month follow up    70 y.o. female presents with the above complaint. History confirmed with patient.  She is diabetic.  Reports no new issues.  Reports good blood sugar control recently.  She got her diabetic shoes but says they are a little bit big.  Objective:  Physical Exam: warm, good capillary refill, no trophic changes or ulcerative lesions, normal DP and PT pulses and normal sensory exam.  Onychomycosis x10.   Assessment:   1. Pain due to onychomycosis of toenails of both feet   2. Diabetes mellitus with peripheral angiopathy ([place])      Plan:  Patient was evaluated and treated and all questions answered.  Patient educated on diabetes. Discussed proper diabetic foot care and discussed risks and complications of disease. Educated patient in depth on reasons to return to the office immediately should he/she discover anything concerning or new on the feet. All questions answered. Discussed proper shoes as well.   Discussed the etiology and treatment options for the condition in detail with the patient. Educated patient on the topical and oral treatment options for mycotic nails. Recommended debridement of the nails today. Sharp and mechanical debridement performed of all painful and mycotic nails today. Nails debrided in length and thickness using a nail nipper and a mechanical burr to level of comfort. Discussed treatment options including appropriate shoe gear. Follow up as needed for painful nails.  Advised to continue to try wearing the shoes if they are uncomfortable to big or causing issues she will let us know if she needs adjusting  Return in about 3 months (around 12/14/2021) for at risk diabetic foot care.

## 2021-09-15 LAB — CUP PACEART REMOTE DEVICE CHECK
Date Time Interrogation Session: 20221103005842
Implantable Pulse Generator Implant Date: 20190423

## 2021-09-16 ENCOUNTER — Other Ambulatory Visit: Payer: Self-pay

## 2021-09-16 ENCOUNTER — Ambulatory Visit
Admission: RE | Admit: 2021-09-16 | Discharge: 2021-09-16 | Disposition: A | Discharge status: Home / Self Care | Payer: Medicare Other | Source: Ambulatory Visit | Attending: Family Medicine | Admitting: Family Medicine

## 2021-09-16 DIAGNOSIS — Z1231 Encounter for screening mammogram for malignant neoplasm of breast: Secondary | ICD-10-CM

## 2021-09-19 ENCOUNTER — Ambulatory Visit (INDEPENDENT_AMBULATORY_CARE_PROVIDER_SITE_OTHER): Payer: Medicare Other

## 2021-09-19 DIAGNOSIS — Z8673 Personal history of transient ischemic attack (TIA), and cerebral infarction without residual deficits: Secondary | ICD-10-CM

## 2021-09-27 NOTE — Progress Notes (Signed)
Carelink Summary Report / Loop Recorder 

## 2021-10-19 LAB — CUP PACEART REMOTE DEVICE CHECK
Date Time Interrogation Session: 20221204000543
Implantable Pulse Generator Implant Date: 20190423

## 2021-10-20 ENCOUNTER — Ambulatory Visit (INDEPENDENT_AMBULATORY_CARE_PROVIDER_SITE_OTHER): Payer: Medicare Other

## 2021-10-20 DIAGNOSIS — Z8673 Personal history of transient ischemic attack (TIA), and cerebral infarction without residual deficits: Secondary | ICD-10-CM

## 2021-10-28 NOTE — Progress Notes (Signed)
Carelink Summary Report / Loop Recorder 

## 2021-11-21 ENCOUNTER — Ambulatory Visit (INDEPENDENT_AMBULATORY_CARE_PROVIDER_SITE_OTHER): Payer: Commercial Managed Care - HMO

## 2021-11-21 DIAGNOSIS — Z8673 Personal history of transient ischemic attack (TIA), and cerebral infarction without residual deficits: Secondary | ICD-10-CM

## 2021-11-21 LAB — CUP PACEART REMOTE DEVICE CHECK
Date Time Interrogation Session: 20230108233435
Implantable Pulse Generator Implant Date: 20190423

## 2021-11-29 NOTE — Progress Notes (Signed)
Carelink Summary Report / Loop Recorder 

## 2021-12-01 ENCOUNTER — Telehealth: Payer: Self-pay

## 2021-12-01 NOTE — Telephone Encounter (Signed)
Alert ILR report received. Battery status show RRT reached today. Sent to triage per protocol.  Normal device function. No new symptom, tachy, brady, or pause episodes. No new AF episodes. Monthly summary reports and ROV/PRN  Successful telephone encounter to patient to discuss linq RRT battery status. Patient is provided option ot explant or leave in place. At this time patient chooses to leave implanted. She is provided device clinic contact and instructed to call for appointment if she decides to have removed in the future. Address confirmed and return kit requested. Patient is marked inactive in paceart, discontinue in carelink, and all future remote appointments have been cancelled.

## 2021-12-15 ENCOUNTER — Ambulatory Visit (INDEPENDENT_AMBULATORY_CARE_PROVIDER_SITE_OTHER): Payer: Medicare Other | Admitting: Podiatrist

## 2021-12-15 ENCOUNTER — Encounter: Payer: Self-pay | Admitting: Podiatrist

## 2021-12-15 ENCOUNTER — Other Ambulatory Visit: Payer: Self-pay

## 2021-12-15 DIAGNOSIS — M79674 Pain in right toe(s): Secondary | ICD-10-CM | POA: Diagnosis not present

## 2021-12-15 DIAGNOSIS — B351 Tinea unguium: Secondary | ICD-10-CM | POA: Diagnosis not present

## 2021-12-15 DIAGNOSIS — M79675 Pain in left toe(s): Secondary | ICD-10-CM

## 2021-12-15 DIAGNOSIS — E1151 Type 2 diabetes mellitus with diabetic peripheral angiopathy without gangrene: Secondary | ICD-10-CM | POA: Diagnosis not present

## 2021-12-15 NOTE — Progress Notes (Signed)
HPI: Patient presents today for follow up of diabetic foot and nail care. Past medical history, meds, and allergies reviewed. Patient states blood sugar is under good  control.   Objective:   Objective:  Patients chart is reviewed.  Vascular status reveals pedal pulses noted at  1 out of 4 dp and pt bilateral .  Mild edema is present bilateral.  Neurological sensation is Normal to [employer] monofilament bilateral at 5/5 sites bilateral.  Dermatological exam reveals  very small hyperkeratotic lesions submet 1 of the left foot. .   Toenails are elongated, incurvated, discolored, dystrophic with fungal appearance present.    Assessment:   ICD-10-CM   1. Diabetes mellitus with peripheral angiopathy (HCC)  E11.51     2. Pain due to onychomycosis of toenails of both feet  B35.1    M79.675    M79.674        Plan: Discussed treatment options and alternatives. Debrided nails without complication.   Return appointment recommended at routine intervals of 3 months.

## 2022-03-16 ENCOUNTER — Ambulatory Visit (INDEPENDENT_AMBULATORY_CARE_PROVIDER_SITE_OTHER): Payer: Medicare Other | Admitting: Podiatry

## 2022-03-16 DIAGNOSIS — B351 Tinea unguium: Secondary | ICD-10-CM

## 2022-03-16 DIAGNOSIS — M79674 Pain in right toe(s): Secondary | ICD-10-CM | POA: Diagnosis not present

## 2022-03-16 DIAGNOSIS — M79675 Pain in left toe(s): Secondary | ICD-10-CM | POA: Diagnosis not present

## 2022-03-16 DIAGNOSIS — E1151 Type 2 diabetes mellitus with diabetic peripheral angiopathy without gangrene: Secondary | ICD-10-CM

## 2022-03-20 NOTE — Progress Notes (Signed)
?  Subjective:  ?Patient ID: [name], female    DOB: 1951/09/01,  MRN: [id number] ? ?Chief Complaint  ?Patient presents with  ? Nail Problem  ?  Thick painful toenails, 3 month follow up   ? Diabetes  ?  At risk foot care, A1C  >14.0  ? ? ?71 y.o. female presents with the above complaint. History confirmed with patient.  She is diabetic.  Reports no new issues.  Nails are thickened and elongated again.  Her blood sugar has been quite high. ? ?Objective:  ?Physical Exam: ?warm, good capillary refill, no trophic changes or ulcerative lesions, normal DP and PT pulses and normal sensory exam.  Onychomycosis x10.  ? ?Assessment:  ? ?1. Pain due to onychomycosis of toenails of both feet   ?2. Diabetes mellitus with peripheral angiopathy ([place])   ? ? ? ?Plan:  ?Patient was evaluated and treated and all questions answered. ? ?Patient educated on diabetes. Discussed proper diabetic foot care and discussed risks and complications of disease. Educated patient in depth on reasons to return to the office immediately should he/she discover anything concerning or new on the feet. All questions answered. Discussed proper shoes as well.  ? ?Discussed the etiology and treatment options for the condition in detail with the patient. Educated patient on the topical and oral treatment options for mycotic nails. Recommended debridement of the nails today. Sharp and mechanical debridement performed of all painful and mycotic nails today. Nails debrided in length and thickness using a nail nipper and a mechanical burr to level of comfort. Discussed treatment options including appropriate shoe gear. Follow up as needed for painful nails. ? ? ? ?Return in about 4 months (around 07/17/2022) for at risk diabetic foot care.  ? ?

## 2022-03-23 ENCOUNTER — Emergency Department (HOSPITAL_COMMUNITY): Payer: Medicare Other

## 2022-03-23 ENCOUNTER — Inpatient Hospital Stay (HOSPITAL_COMMUNITY)
Admission: EM | Admit: 2022-03-23 | Discharge: 2022-04-18 | DRG: 004 | Disposition: A | Discharge status: Other Acute Inpatient Hospital | Payer: Medicare Other | Attending: Internal Medicine | Admitting: Internal Medicine

## 2022-03-23 ENCOUNTER — Encounter (HOSPITAL_COMMUNITY): Payer: Self-pay

## 2022-03-23 ENCOUNTER — Other Ambulatory Visit: Payer: Self-pay

## 2022-03-23 DIAGNOSIS — M79604 Pain in right leg: Secondary | ICD-10-CM | POA: Diagnosis not present

## 2022-03-23 DIAGNOSIS — J690 Pneumonitis due to inhalation of food and vomit: Secondary | ICD-10-CM | POA: Diagnosis not present

## 2022-03-23 DIAGNOSIS — I6603 Occlusion and stenosis of bilateral middle cerebral arteries: Secondary | ICD-10-CM | POA: Diagnosis present

## 2022-03-23 DIAGNOSIS — Z794 Long term (current) use of insulin: Secondary | ICD-10-CM

## 2022-03-23 DIAGNOSIS — I63411 Cerebral infarction due to embolism of right middle cerebral artery: Secondary | ICD-10-CM | POA: Diagnosis not present

## 2022-03-23 DIAGNOSIS — D638 Anemia in other chronic diseases classified elsewhere: Secondary | ICD-10-CM | POA: Diagnosis present

## 2022-03-23 DIAGNOSIS — Z888 Allergy status to other drugs, medicaments and biological substances status: Secondary | ICD-10-CM

## 2022-03-23 DIAGNOSIS — J9602 Acute respiratory failure with hypercapnia: Secondary | ICD-10-CM | POA: Diagnosis not present

## 2022-03-23 DIAGNOSIS — I469 Cardiac arrest, cause unspecified: Secondary | ICD-10-CM | POA: Diagnosis not present

## 2022-03-23 DIAGNOSIS — J9621 Acute and chronic respiratory failure with hypoxia: Secondary | ICD-10-CM | POA: Diagnosis not present

## 2022-03-23 DIAGNOSIS — E870 Hyperosmolality and hypernatremia: Secondary | ICD-10-CM | POA: Diagnosis not present

## 2022-03-23 DIAGNOSIS — I129 Hypertensive chronic kidney disease with stage 1 through stage 4 chronic kidney disease, or unspecified chronic kidney disease: Secondary | ICD-10-CM | POA: Diagnosis present

## 2022-03-23 DIAGNOSIS — R739 Hyperglycemia, unspecified: Secondary | ICD-10-CM

## 2022-03-23 DIAGNOSIS — I639 Cerebral infarction, unspecified: Secondary | ICD-10-CM | POA: Diagnosis not present

## 2022-03-23 DIAGNOSIS — I455 Other specified heart block: Secondary | ICD-10-CM | POA: Diagnosis not present

## 2022-03-23 DIAGNOSIS — I6389 Other cerebral infarction: Secondary | ICD-10-CM | POA: Diagnosis not present

## 2022-03-23 DIAGNOSIS — K219 Gastro-esophageal reflux disease without esophagitis: Secondary | ICD-10-CM | POA: Diagnosis not present

## 2022-03-23 DIAGNOSIS — J939 Pneumothorax, unspecified: Secondary | ICD-10-CM | POA: Diagnosis not present

## 2022-03-23 DIAGNOSIS — R1313 Dysphagia, pharyngeal phase: Secondary | ICD-10-CM | POA: Diagnosis not present

## 2022-03-23 DIAGNOSIS — I6329 Cerebral infarction due to unspecified occlusion or stenosis of other precerebral arteries: Secondary | ICD-10-CM | POA: Diagnosis not present

## 2022-03-23 DIAGNOSIS — N1832 Chronic kidney disease, stage 3b: Secondary | ICD-10-CM | POA: Diagnosis present

## 2022-03-23 DIAGNOSIS — J9501 Hemorrhage from tracheostomy stoma: Secondary | ICD-10-CM | POA: Diagnosis not present

## 2022-03-23 DIAGNOSIS — E8770 Fluid overload, unspecified: Secondary | ICD-10-CM | POA: Diagnosis not present

## 2022-03-23 DIAGNOSIS — R1310 Dysphagia, unspecified: Secondary | ICD-10-CM | POA: Diagnosis not present

## 2022-03-23 DIAGNOSIS — I468 Cardiac arrest due to other underlying condition: Secondary | ICD-10-CM | POA: Diagnosis not present

## 2022-03-23 DIAGNOSIS — E039 Hypothyroidism, unspecified: Secondary | ICD-10-CM | POA: Diagnosis present

## 2022-03-23 DIAGNOSIS — R001 Bradycardia, unspecified: Secondary | ICD-10-CM | POA: Diagnosis not present

## 2022-03-23 DIAGNOSIS — I69354 Hemiplegia and hemiparesis following cerebral infarction affecting left non-dominant side: Secondary | ICD-10-CM

## 2022-03-23 DIAGNOSIS — G9341 Metabolic encephalopathy: Secondary | ICD-10-CM | POA: Diagnosis not present

## 2022-03-23 DIAGNOSIS — J9811 Atelectasis: Secondary | ICD-10-CM | POA: Diagnosis not present

## 2022-03-23 DIAGNOSIS — R55 Syncope and collapse: Principal | ICD-10-CM

## 2022-03-23 DIAGNOSIS — E1165 Type 2 diabetes mellitus with hyperglycemia: Secondary | ICD-10-CM | POA: Diagnosis present

## 2022-03-23 DIAGNOSIS — I2510 Atherosclerotic heart disease of native coronary artery without angina pectoris: Secondary | ICD-10-CM | POA: Diagnosis not present

## 2022-03-23 DIAGNOSIS — Z7189 Other specified counseling: Secondary | ICD-10-CM | POA: Diagnosis not present

## 2022-03-23 DIAGNOSIS — Z515 Encounter for palliative care: Secondary | ICD-10-CM

## 2022-03-23 DIAGNOSIS — R040 Epistaxis: Secondary | ICD-10-CM | POA: Diagnosis not present

## 2022-03-23 DIAGNOSIS — K5900 Constipation, unspecified: Secondary | ICD-10-CM | POA: Diagnosis not present

## 2022-03-23 DIAGNOSIS — I69391 Dysphagia following cerebral infarction: Secondary | ICD-10-CM

## 2022-03-23 DIAGNOSIS — R29700 NIHSS score 0: Secondary | ICD-10-CM | POA: Diagnosis present

## 2022-03-23 DIAGNOSIS — Z7902 Long term (current) use of antithrombotics/antiplatelets: Secondary | ICD-10-CM

## 2022-03-23 DIAGNOSIS — Z781 Physical restraint status: Secondary | ICD-10-CM

## 2022-03-23 DIAGNOSIS — E1151 Type 2 diabetes mellitus with diabetic peripheral angiopathy without gangrene: Secondary | ICD-10-CM | POA: Diagnosis present

## 2022-03-23 DIAGNOSIS — D62 Acute posthemorrhagic anemia: Secondary | ICD-10-CM | POA: Diagnosis not present

## 2022-03-23 DIAGNOSIS — Z833 Family history of diabetes mellitus: Secondary | ICD-10-CM

## 2022-03-23 DIAGNOSIS — E785 Hyperlipidemia, unspecified: Secondary | ICD-10-CM

## 2022-03-23 DIAGNOSIS — E871 Hypo-osmolality and hyponatremia: Secondary | ICD-10-CM | POA: Diagnosis not present

## 2022-03-23 DIAGNOSIS — R0789 Other chest pain: Secondary | ICD-10-CM | POA: Diagnosis not present

## 2022-03-23 DIAGNOSIS — I10 Essential (primary) hypertension: Secondary | ICD-10-CM | POA: Diagnosis not present

## 2022-03-23 DIAGNOSIS — R1312 Dysphagia, oropharyngeal phase: Secondary | ICD-10-CM | POA: Diagnosis not present

## 2022-03-23 DIAGNOSIS — R569 Unspecified convulsions: Secondary | ICD-10-CM | POA: Diagnosis not present

## 2022-03-23 DIAGNOSIS — N179 Acute kidney failure, unspecified: Secondary | ICD-10-CM | POA: Diagnosis present

## 2022-03-23 DIAGNOSIS — R451 Restlessness and agitation: Secondary | ICD-10-CM | POA: Diagnosis not present

## 2022-03-23 DIAGNOSIS — Z87891 Personal history of nicotine dependence: Secondary | ICD-10-CM

## 2022-03-23 DIAGNOSIS — I6381 Other cerebral infarction due to occlusion or stenosis of small artery: Secondary | ICD-10-CM | POA: Diagnosis not present

## 2022-03-23 DIAGNOSIS — J811 Chronic pulmonary edema: Secondary | ICD-10-CM

## 2022-03-23 DIAGNOSIS — D649 Anemia, unspecified: Secondary | ICD-10-CM | POA: Diagnosis present

## 2022-03-23 DIAGNOSIS — J9601 Acute respiratory failure with hypoxia: Secondary | ICD-10-CM | POA: Diagnosis not present

## 2022-03-23 DIAGNOSIS — E1169 Type 2 diabetes mellitus with other specified complication: Secondary | ICD-10-CM | POA: Diagnosis not present

## 2022-03-23 DIAGNOSIS — E119 Type 2 diabetes mellitus without complications: Secondary | ICD-10-CM

## 2022-03-23 DIAGNOSIS — E1122 Type 2 diabetes mellitus with diabetic chronic kidney disease: Secondary | ICD-10-CM | POA: Diagnosis present

## 2022-03-23 DIAGNOSIS — M79605 Pain in left leg: Secondary | ICD-10-CM | POA: Diagnosis not present

## 2022-03-23 DIAGNOSIS — R29810 Facial weakness: Secondary | ICD-10-CM | POA: Diagnosis present

## 2022-03-23 DIAGNOSIS — I63212 Cerebral infarction due to unspecified occlusion or stenosis of left vertebral arteries: Secondary | ICD-10-CM | POA: Diagnosis not present

## 2022-03-23 DIAGNOSIS — Z930 Tracheostomy status: Secondary | ICD-10-CM | POA: Diagnosis not present

## 2022-03-23 DIAGNOSIS — Z79899 Other long term (current) drug therapy: Secondary | ICD-10-CM

## 2022-03-23 DIAGNOSIS — I495 Sick sinus syndrome: Secondary | ICD-10-CM | POA: Diagnosis not present

## 2022-03-23 DIAGNOSIS — E782 Mixed hyperlipidemia: Secondary | ICD-10-CM | POA: Diagnosis not present

## 2022-03-23 DIAGNOSIS — Z885 Allergy status to narcotic agent status: Secondary | ICD-10-CM

## 2022-03-23 DIAGNOSIS — E11649 Type 2 diabetes mellitus with hypoglycemia without coma: Secondary | ICD-10-CM | POA: Diagnosis not present

## 2022-03-23 DIAGNOSIS — E038 Other specified hypothyroidism: Secondary | ICD-10-CM | POA: Diagnosis not present

## 2022-03-23 DIAGNOSIS — E1159 Type 2 diabetes mellitus with other circulatory complications: Secondary | ICD-10-CM | POA: Diagnosis not present

## 2022-03-23 DIAGNOSIS — Z79890 Hormone replacement therapy (postmenopausal): Secondary | ICD-10-CM

## 2022-03-23 DIAGNOSIS — R471 Dysarthria and anarthria: Secondary | ICD-10-CM | POA: Diagnosis present

## 2022-03-23 DIAGNOSIS — E878 Other disorders of electrolyte and fluid balance, not elsewhere classified: Secondary | ICD-10-CM | POA: Diagnosis present

## 2022-03-23 DIAGNOSIS — J9600 Acute respiratory failure, unspecified whether with hypoxia or hypercapnia: Secondary | ICD-10-CM | POA: Diagnosis not present

## 2022-03-23 DIAGNOSIS — R061 Stridor: Secondary | ICD-10-CM | POA: Diagnosis not present

## 2022-03-23 DIAGNOSIS — Z9911 Dependence on respirator [ventilator] status: Secondary | ICD-10-CM | POA: Diagnosis not present

## 2022-03-23 LAB — CBC
HCT: 36.2 % (ref 36.0–46.0)
Hemoglobin: 11.7 g/dL — ABNORMAL LOW (ref 12.0–15.0)
MCH: 28.7 pg (ref 26.0–34.0)
MCHC: 32.3 g/dL (ref 30.0–36.0)
MCV: 88.7 fL (ref 80.0–100.0)
Platelets: 314 10*3/uL (ref 150–400)
RBC: 4.08 MIL/uL (ref 3.87–5.11)
RDW: 13.9 % (ref 11.5–15.5)
WBC: 6.3 10*3/uL (ref 4.0–10.5)
nRBC: 0 % (ref 0.0–0.2)

## 2022-03-23 LAB — URINALYSIS, ROUTINE W REFLEX MICROSCOPIC
Bilirubin Urine: NEGATIVE
Glucose, UA: >=500 mg/dL — AB
Hgb urine dipstick: NEGATIVE
Ketones, ur: NEGATIVE mg/dL
Leukocytes,Ua: NEGATIVE
Nitrite: NEGATIVE
Protein, ur: 100 mg/dL — AB
Specific Gravity, Urine: 1.013 (ref 1.005–1.030)
pH: 6 (ref 5.0–8.0)

## 2022-03-23 LAB — BASIC METABOLIC PANEL
Anion gap: 11 (ref 5–15)
BUN: 27 mg/dL — ABNORMAL HIGH (ref 8–23)
CO2: 23 mmol/L (ref 22–32)
Calcium: 9.9 mg/dL (ref 8.9–10.3)
Chloride: 102 mmol/L (ref 98–111)
Creatinine, Ser: 1.41 mg/dL — ABNORMAL HIGH (ref 0.44–1.00)
GFR, Estimated: 40 mL/min — ABNORMAL LOW (ref >60)
Glucose, Bld: 382 mg/dL — ABNORMAL HIGH (ref 70–99)
Potassium: 4.1 mmol/L (ref 3.5–5.1)
Sodium: 136 mmol/L (ref 135–145)

## 2022-03-23 LAB — CBG MONITORING, ED: Glucose-Capillary: 354 mg/dL — ABNORMAL HIGH (ref 70–99)

## 2022-03-23 LAB — GLUCOSE, CAPILLARY: Glucose-Capillary: 353 mg/dL — ABNORMAL HIGH (ref 70–99)

## 2022-03-23 MED ORDER — MECLIZINE HCL 25 MG PO TABS
12.5000 mg | ORAL_TABLET | Freq: Once | ORAL | Status: AC
Start: 2022-03-23 — End: 2022-03-23
  Administered 2022-03-23: 12.5 mg via ORAL
  Filled 2022-03-23: qty 1

## 2022-03-23 MED ORDER — LEVOTHYROXINE SODIUM 88 MCG PO TABS
88.0000 ug | ORAL_TABLET | Freq: Every day | ORAL | Status: DC
  Filled 2022-03-23: qty 1

## 2022-03-23 MED ORDER — ASPIRIN 300 MG RE SUPP: 300.0000 mg | Freq: Every day | RECTAL | Status: DC

## 2022-03-23 MED ORDER — ACETAMINOPHEN 325 MG PO TABS
650.0000 mg | ORAL_TABLET | Freq: Once | ORAL | Status: AC
Start: 2022-03-23 — End: 2022-03-23
  Administered 2022-03-23: 650 mg via ORAL
  Filled 2022-03-23: qty 2

## 2022-03-23 MED ORDER — ACETAMINOPHEN 160 MG/5ML PO SOLN
650.0000 mg | ORAL | Status: DC | PRN
  Administered 2022-04-01 – 2022-04-08 (×7): 650 mg
  Filled 2022-03-23 (×7): qty 20.3

## 2022-03-23 MED ORDER — MORPHINE SULFATE (PF) 2 MG/ML IV SOLN
1.0000 mg | Freq: Once | INTRAVENOUS | Status: AC | PRN
  Administered 2022-03-23: 1 mg via INTRAVENOUS
  Filled 2022-03-23: qty 1

## 2022-03-23 MED ORDER — GEMFIBROZIL 600 MG PO TABS
600.0000 mg | ORAL_TABLET | Freq: Every day | ORAL | Status: DC
  Filled 2022-03-23 (×2): qty 1

## 2022-03-23 MED ORDER — INSULIN ASPART 100 UNIT/ML IJ SOLN
0.0000 [IU] | Freq: Three times a day (TID) | INTRAMUSCULAR | Status: DC
  Administered 2022-03-24: 20 [IU] via SUBCUTANEOUS
  Administered 2022-03-24 – 2022-03-25 (×2): 15 [IU] via SUBCUTANEOUS
  Filled 2022-03-23: qty 0.2

## 2022-03-23 MED ORDER — ACETAMINOPHEN 650 MG RE SUPP: 650.0000 mg | RECTAL | Status: DC | PRN

## 2022-03-23 MED ORDER — ASPIRIN 325 MG PO TABS
325.0000 mg | ORAL_TABLET | Freq: Every day | ORAL | Status: DC
  Administered 2022-03-23: 325 mg via ORAL
  Filled 2022-03-23: qty 1

## 2022-03-23 MED ORDER — STROKE: EARLY STAGES OF RECOVERY BOOK
Freq: Once | Status: AC
  Filled 2022-03-23: qty 1

## 2022-03-23 MED ORDER — SENNOSIDES-DOCUSATE SODIUM 8.6-50 MG PO TABS: 1.0000 | ORAL_TABLET | Freq: Every evening | ORAL | Status: DC | PRN

## 2022-03-23 MED ORDER — ACETAMINOPHEN 325 MG PO TABS
650.0000 mg | ORAL_TABLET | ORAL | Status: DC | PRN
  Administered 2022-03-23: 650 mg via ORAL
  Filled 2022-03-23 (×2): qty 2

## 2022-03-23 MED ORDER — SODIUM CHLORIDE 0.9 % IV BOLUS
1000.0000 mL | Freq: Once | INTRAVENOUS | Status: AC
  Administered 2022-03-23: 1000 mL via INTRAVENOUS

## 2022-03-23 MED ORDER — INSULIN ASPART 100 UNIT/ML IJ SOLN
0.0000 [IU] | Freq: Every day | INTRAMUSCULAR | Status: DC
  Administered 2022-03-23: 5 [IU] via SUBCUTANEOUS
  Administered 2022-03-24: 3 [IU] via SUBCUTANEOUS
  Filled 2022-03-23: qty 0.05

## 2022-03-23 NOTE — ED Provider Notes (Signed)
?  Physical Exam  ?BP (!) 186/74   Pulse 79   Temp 97.8 ?F (36.6 ?C) (Oral)   Resp 18   Ht '5\' 2"'$  (1.575 m)   Wt 72.6 kg   SpO2 92%   BMI 29.26 kg/m?  ? ?Physical Exam ? ?Procedures  ?Procedures ? ?ED Course / MDM  ? ?Clinical Course as of 03/23/22 1633  ?Thu Mar 23, 2022  ?1137 Patient feels better after IV fluids and wants to go home.  Think this is reasonable at this time.  Recommended hydration and close follow-up with her PCP.  Return instructions discussed [MB]  ?1345 Patient states when she tried to sit up she became dizzy again.  Have ordered her MRI. [MB]  ?  ?Clinical Course User Index ?[MB] Hayden Rasmussen, MD  ? ?Medical Decision Making ?Amount and/or Complexity of Data Reviewed ?Labs: ordered. ?Radiology: ordered. ? ?Risk ?OTC drugs. ?Decision regarding hospitalization. ? ?Received patient in signout.  Dizziness.  Lightheadedness.  Began around 5 days ago.  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  Not a candidate for TNK since symptoms of been going on for 5 days.  Also some hyperglycemia.  Will admit to internal medicine.  Discussed with Dr. Marylyn Ishihara from internal medicine and Dr. Neurology.  I have independently reviewed and interpreted the MRI. ? ? ? ? ?  ?Davonna Belling, MD ?03/23/22 1633 ? ?

## 2022-03-23 NOTE — H&P (Signed)
?History and Physical  ? ? ?Patient: [name] IWL:[id number] DOB: 1951-02-12 ?DOA: 03/23/2022 ?DOS: the patient was seen and examined on 03/23/2022 ?PCP: Buzzy Han, MD (Inactive)  ?Patient coming from: Home ? ?Chief Complaint:  ?Chief Complaint  ?Patient presents with  ? Near Syncope  ? Nausea  ? ?HPI: [name] is a 71 y.o. female with medical history significant of CVA, DM2, HTN, CKD3b, HLD. Presenting with weakness and lightheadedness. She says her symptoms started about 5 days ago. She felt sick after eating a salad. She decided to go to bed. When she woke up , the room was spinning. She thought her sugar may have been low. She checked it and it was fine. She noted that she had a headache. She felt like she was walking in a fog w/ poor balance. She has had that sensation daily since. When her symptoms didn't improve today, she decided to come to the ED for assistance. She denies any other aggravating or alleviating factors.   ? ?Review of Systems: As mentioned in the history of present illness. All other systems reviewed and are negative. ?Past Medical History:  ?Diagnosis Date  ? Anemia   ? Arteriosclerotic cardiovascular disease   ? CKD (chronic kidney disease) stage 3, GFR 30-59 ml/min (HCC)   ? Claudication in peripheral vascular disease ([place])   ? CVA (cerebral vascular accident) [facility])   ? 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 GERD (gastroesophageal reflux disease)   ? Hyperlipidemia   ? declines statins  ? Hypertension   ? Hypothyroidism   ? Type 2 diabetes mellitus ([place]) 2000  ? Vitamin D deficiency   ? ?Past Surgical History:  ?Procedure Laterality Date  ? CESAREAN SECTION    ? COLONOSCOPY  09/21/2020  ? LEFT HEART CATHETERIZATION WITH CORONARY ANGIOGRAM N/A 03/08/2012  ? Procedure: LEFT HEART CATHETERIZATION WITH CORONARY ANGIOGRAM;  Surgeon: Sinclair Grooms, MD;  Location: [facility] CATH LAB;  Service: Cardiovascular;  Laterality: N/A;   ? LEFT HEART CATHETERIZATION WITH CORONARY ANGIOGRAM N/A 12/17/2012  ? Procedure: LEFT HEART CATHETERIZATION WITH CORONARY ANGIOGRAM;  Surgeon: Laverda Page, MD;  Location: [facility] CATH LAB;  Service: Cardiovascular;  Laterality: N/A;  ? LOOP RECORDER INSERTION N/A 03/05/2018  ? Procedure: LOOP RECORDER INSERTION;  Surgeon: Evans Lance, MD;  Location: [facility] CV LAB;  Service: Cardiovascular;  Laterality: N/A;  ? LOWER EXTREMITY ANGIOGRAM N/A 12/17/2012  ? Procedure: LOWER EXTREMITY ANGIOGRAM;  Surgeon: Laverda Page, MD;  Location: [facility] CATH LAB;  Service: Cardiovascular;  Laterality: N/A;  ? TEE WITHOUT CARDIOVERSION N/A 02/28/2018  ? Procedure: TRANSESOPHAGEAL ECHOCARDIOGRAM (TEE);  Surgeon: Jerline Pain, MD;  Location: [facility] ENDOSCOPY;  Service: Cardiovascular;  Laterality: N/A;  ? TUBAL LIGATION    ? ?Social History:  reports that she quit smoking about 11 years ago. She has never used smokeless tobacco. She reports that she does not drink alcohol and does not use drugs. ? ?Allergies  ?Allergen Reactions  ? Dilantin [Phenytoin Sodium Extended]   ?  Rash/itching  ? Insulins Rash  ?  Humalog 75/25 only  ? Statins Nausea Only and Rash  ?  "sick to my stomach"; describes "rash and a lump" while taking statins  ? ? ?Family History  ?Problem Relation Age of Onset  ? Diabetes Brother   ? Prostate cancer Brother   ? Diabetes Mother   ? Diabetes Father   ? Diabetes Sister   ?  Esophageal cancer Neg Hx   ? Rectal cancer Neg Hx   ? Stomach cancer Neg Hx   ? Colon cancer Neg Hx   ? Breast cancer Neg Hx   ? ? ?Prior to Admission medications   ?Medication Sig Start Date End Date Taking? Authorizing Provider  ?BD INSULIN SYRINGE ULTRAFINE 31G X 15/64" 0.5 ML MISC USE TO INJECT INSULIN TWO TIMES A DAY 11/15/16   [name], MD  ?cilostazol (PLETAL) 100 MG tablet TAKE 1 TABLET BY MOUTH TWICE A DAY 01/28/19   [name], MD  ?clopidogrel (PLAVIX) 75 MG tablet TAKE 1 TABLET BY MOUTH EVERY DAY IN THE MORNING 05/18/18    [name], MD  ?D3-1000 25 MCG (1000 UT) capsule TAKE 1 CAPSULE (1,000 UNITS TOTAL) BY MOUTH DAILY. 03/14/19   [name], MD  ?famotidine (PEPCID) 20 MG tablet TAKE 1 TABLET BY MOUTH DAILY AS NEEDED FOR HEARTBURN OR INDIGESTION. 03/25/19   [name], MD  ?gemfibrozil (LOPID) 600 MG tablet TAKE 1 TABLET BY MOUTH TWICE A DAY 09/02/18   [name], MD  ?glucose blood (ACCU-CHEK AVIVA PLUS) test strip 1 each by Other route 2 (two) times daily. Use as instructed 08/14/19   Axel Filler, MD  ?Insulin Pen Needle (B-D UF III MINI PEN NEEDLES) 31G X 5 MM MISC INJECTS INSULIN 2 TIMES PER DAY. 05/06/19   Asencion Noble, MD  ?Lancets (ACCU-CHEK SOFT TOUCH) lancets Use as instructed 05/28/19   Jose Persia, MD  ?LANTUS SOLOSTAR 100 UNIT/ML Solostar Pen SMARTSIG:25 Unit(s) SUB-Q Twice Daily 02/24/20   [provider]  ?levothyroxine (SYNTHROID, LEVOTHROID) 88 MCG tablet TAKE 1 TABLET BY MOUTH EVERY DAY BEFORE BREAKFAST 09/05/18   [name], MD  ?losartan-hydrochlorothiazide (HYZAAR) 100-25 MG tablet Take 1 tablet by mouth daily. 06/19/19   Axel Filler, MD  ?Multiple Vitamins-Minerals (MULTIVITAMIN ADULT PO) Take 1 tablet by mouth once a week. On Wednesday    [provider]  ? ? ?Physical Exam: ?Vitals:  ? 03/23/22 1300 03/23/22 1330 03/23/22 1400 03/23/22 1541  ?BP: (!) 190/75 (!) 181/72 (!) 189/74 (!) 186/74  ?Pulse: 73 68 71 79  ?Resp: '17 13 20 18  '$ ?Temp:      ?TempSrc:      ?SpO2: 97% 92% 96% 92%  ?Weight:      ?Height:      ? ?General: 71 y.o. female resting in bed in NAD ?Eyes: PERRL, normal sclera ?ENMT: Nares patent w/o discharge, orophaynx clear, dentition normal, ears w/o discharge/lesions/ulcers ?Neck: Supple, trachea midline ?Cardiovascular: RRR, +S1, S2, no g/r, 1/6 SEM equal pulses throughout ?Respiratory: CTABL, no w/r/r, normal WOB ?GI: BS+, NDNT, no masses noted, no organomegaly noted ?MSK: No e/c/c ?Skin: No rashes, bruises, ulcerations  noted ?Neuro: A&O x 3, no focal deficits ?Psyc: Appropriate interaction and affect, calm/cooperative ? ?Data Reviewed: ? ?Na+  136 ?Glucose  382 ?Scr  1.41 ? ?MRI Brain: ?1. Small acute to early subacute infarct in the left aspect of the medulla. No associated hemorrhage or mass effect. ?2. Background moderate chronic white matter microangiopathy, progressed since 2019, and small remote lacunar infarcts in the bilateral basal ganglia and right caudate head. ? ?Assessment and Plan: ?No notes have been filed under this hospital service. ?Service: Hospitalist ?CVA ?    - admit to inpt, tele @ [facility] ?    - MRI brain as above ?    - EDP spoke with Dr. Quinn Axe, rec'd admission to [facility] ?    - PT/OT/SLP/TOC ?    -  A1c, lipid panel ?    - echo, MRA head and neck  ?    - swallow screen, may have DM diet if she passes ?    - permissive HTN for now ? ? ?DM2, uncontrolled ?    - SSI, A1c, glucose checks ? ?HTN ?    - permissive HTN for now ? ?Hypothyroidism ?    - resume home regimen ? ?CKD3b ?    - at baseline, follow ? ?HLD ?    - statin ? ?Advance Care Planning:   Code Status: DNI ? ?Consults: EDP consulted neurology ? ?Family Communication: None at bedside ? ?Severity of Illness: ?The appropriate patient status for this patient is INPATIENT. Inpatient status is judged to be reasonable and necessary in order to provide the required intensity of service to ensure the patient's safety. The patient's presenting symptoms, physical exam findings, and initial radiographic and laboratory data in the context of their chronic comorbidities is felt to place them at high risk for further clinical deterioration. Furthermore, it is not anticipated that the patient will be medically stable for discharge from the hospital within 2 midnights of admission.  ? ?* I certify that at the point of admission it is my clinical judgment that the patient will require inpatient hospital care spanning beyond 2 midnights from the point of admission due to  high intensity of service, high risk for further deterioration and high frequency of surveillance required.* ? ?Author: ?Jonnie Finner, DO ?03/23/2022 4:07 PM ? ?For on call review [email or web address].  ?

## 2022-03-23 NOTE — ED Notes (Signed)
Pt given ginger ale, tolerating w/o difficulty.  ?

## 2022-03-23 NOTE — ED Provider Notes (Signed)
?[facility] DEPT ?Provider Note ? ? ?CSN: [id number] ?Arrival date & time: 03/23/22  6010 ? ?  ? ?History ? ?Chief Complaint  ?Patient presents with  ? Near Syncope  ? Nausea  ? ? ?[name] is a 71 y.o. female.  She has a history of diabetes and for the last 2 weeks has been eating salads for morning and night due to concerns of elevated blood sugars.  For the last 4 days she has felt lightheaded and unsteady on her feet.  Complaining of headache and feeling generally weak.  She has had some diarrhea.  No chest pain.  No dysuria.  Complaining of "swimmy headed feeling" ? ?The history is provided by the patient and a relative.  ?Near Syncope ?This is a new problem. The current episode started more than 2 days ago. The problem occurs constantly. The problem has not changed since onset.Associated symptoms include headaches. Pertinent negatives include no chest pain, no abdominal pain and no shortness of breath. The symptoms are aggravated by walking. Nothing relieves the symptoms. She has tried rest for the symptoms. The treatment provided no relief.  ? ?  ? ?Home Medications ?Prior to Admission medications   ?Medication Sig Start Date End Date Taking? Authorizing Provider  ?BD INSULIN SYRINGE ULTRAFINE 31G X 15/64" 0.5 ML MISC USE TO INJECT INSULIN TWO TIMES A DAY 11/15/16   [name], MD  ?cilostazol (PLETAL) 100 MG tablet TAKE 1 TABLET BY MOUTH TWICE A DAY 01/28/19   [name], MD  ?clopidogrel (PLAVIX) 75 MG tablet TAKE 1 TABLET BY MOUTH EVERY DAY IN THE MORNING 05/18/18   [name], MD  ?D3-1000 25 MCG (1000 UT) capsule TAKE 1 CAPSULE (1,000 UNITS TOTAL) BY MOUTH DAILY. 03/14/19   [name], MD  ?famotidine (PEPCID) 20 MG tablet TAKE 1 TABLET BY MOUTH DAILY AS NEEDED FOR HEARTBURN OR INDIGESTION. 03/25/19   [name], MD  ?gemfibrozil (LOPID) 600 MG tablet TAKE 1 TABLET BY MOUTH TWICE A DAY 09/02/18   [name], MD  ?glucose blood (ACCU-CHEK AVIVA  PLUS) test strip 1 each by Other route 2 (two) times daily. Use as instructed 08/14/19   Axel Filler, MD  ?Insulin Pen Needle (B-D UF III MINI PEN NEEDLES) 31G X 5 MM MISC INJECTS INSULIN 2 TIMES PER DAY. 05/06/19   Asencion Noble, MD  ?Lancets (ACCU-CHEK SOFT TOUCH) lancets Use as instructed 05/28/19   Jose Persia, MD  ?LANTUS SOLOSTAR 100 UNIT/ML Solostar Pen SMARTSIG:25 Unit(s) SUB-Q Twice Daily 02/24/20   [provider]  ?levothyroxine (SYNTHROID, LEVOTHROID) 88 MCG tablet TAKE 1 TABLET BY MOUTH EVERY DAY BEFORE BREAKFAST 09/05/18   [name], MD  ?losartan-hydrochlorothiazide (HYZAAR) 100-25 MG tablet Take 1 tablet by mouth daily. 06/19/19   Axel Filler, MD  ?Multiple Vitamins-Minerals (MULTIVITAMIN ADULT PO) Take 1 tablet by mouth once a week. On Wednesday    [provider]  ?   ? ?Allergies    ?Dilantin [phenytoin sodium extended], Insulins, and Statins   ? ?Review of Systems   ?Review of Systems  ?Constitutional:  Negative for fever.  ?HENT:  Negative for sore throat.   ?Eyes:  Negative for visual disturbance.  ?Respiratory:  Negative for shortness of breath.   ?Cardiovascular:  Positive for near-syncope. Negative for chest pain.  ?Gastrointestinal:  Positive for diarrhea and nausea. Negative for abdominal pain and vomiting.  ?Genitourinary:  Negative for dysuria.  ?Musculoskeletal:  Negative for back pain.  ?Skin:  Negative for rash.  ?Neurological:  Positive for dizziness, light-headedness and headaches.  ? ?Physical Exam ?Updated Vital Signs ?BP (!) 177/91   Pulse 80   Temp 97.8 ?F (36.6 ?C) (Oral)   Resp (!) 24   Ht '5\' 2"'$  (1.575 m)   Wt 72.6 kg   SpO2 99%   BMI 29.26 kg/m?  ?Physical Exam ?Vitals and nursing note reviewed.  ?Constitutional:   ?   General: She is not in acute distress. ?   Appearance: Normal appearance. She is well-developed.  ?HENT:  ?   Head: Normocephalic and atraumatic.  ?   Right Ear: Tympanic membrane normal.  ?   Left Ear:  Tympanic membrane normal.  ?   Nose: Nose normal.  ?   Mouth/Throat:  ?   Mouth: Mucous membranes are moist.  ?   Pharynx: Oropharynx is clear.  ?Eyes:  ?   Conjunctiva/sclera: Conjunctivae normal.  ?Cardiovascular:  ?   Rate and Rhythm: Normal rate and regular rhythm.  ?   Heart sounds: No murmur heard. ?Pulmonary:  ?   Effort: Pulmonary effort is normal. No respiratory distress.  ?   Breath sounds: Normal breath sounds.  ?Abdominal:  ?   Palpations: Abdomen is soft.  ?   Tenderness: There is no abdominal tenderness. There is no guarding or rebound.  ?Musculoskeletal:     ?   General: No swelling.  ?   Cervical back: Neck supple.  ?Skin: ?   General: Skin is warm and dry.  ?   Capillary Refill: Capillary refill takes less than 2 seconds.  ?Neurological:  ?   General: No focal deficit present.  ?   Mental Status: She is alert and oriented to person, place, and time.  ?   Cranial Nerves: No cranial nerve deficit.  ?   Sensory: No sensory deficit.  ?   Motor: No weakness.  ? ? ?ED Results / Procedures / Treatments   ?Labs ?(all labs ordered are listed, but only abnormal results are displayed) ?Labs Reviewed  ?BASIC METABOLIC PANEL - Abnormal; Notable for the following components:  ?    Result Value  ? Glucose, Bld 382 (*)   ? BUN 27 (*)   ? Creatinine, Ser 1.41 (*)   ? GFR, Estimated 40 (*)   ? All other components within normal limits  ?CBC - Abnormal; Notable for the following components:  ? Hemoglobin 11.7 (*)   ? All other components within normal limits  ?URINALYSIS, ROUTINE W REFLEX MICROSCOPIC - Abnormal; Notable for the following components:  ? Glucose, UA >=500 (*)   ? Protein, ur 100 (*)   ? Bacteria, UA RARE (*)   ? All other components within normal limits  ?CBG MONITORING, ED - Abnormal; Notable for the following components:  ? Glucose-Capillary 354 (*)   ? All other components within normal limits  ? ? ?EKG ?EKG Interpretation ? ?Date/Time:  Thursday Mar 23 2022 07:37:51 EDT ?Ventricular Rate:  83 ?PR  Interval:  184 ?QRS Duration: 91 ?QT Interval:  393 ?QTC Calculation: 462 ?R Axis:   68 ?Text Interpretation: Sinus rhythm Abnormal T, consider ischemia, lateral leads No significant change since prior 4/19 Confirmed by Aletta Edouard [phone]) on 03/23/2022 7:56:00 AM ? ?Radiology ?MR BRAIN WO CONTRAST ? ?Result Date: 03/23/2022 ?CLINICAL DATA:  Woke up 4 days ago feeling like she was going to pass out, persisted. Nausea. Evaluate for stroke EXAM: MRI HEAD WITHOUT CONTRAST TECHNIQUE: Multiplanar, multiecho pulse sequences of the  brain and surrounding structures were obtained without intravenous contrast. COMPARISON:  MR head 02/16/2018 FINDINGS: Brain: There are small foci of patchy diffusion restriction in the left aspect of the medulla consistent with acute to early subacute infarct (5-58). There is no associated hemorrhage or mass effect. There is no other acute infarct. There is no acute intracranial hemorrhage or extra-axial fluid collection. Background parenchymal volume is normal. The ventricles are normal in size. Patchy and confluent FLAIR signal abnormality throughout the subcortical and periventricular white matter likely reflects sequela of moderate chronic white matter microangiopathy, progressed since 2019. There are small remote lacunar infarcts in the bilateral basal ganglia. There is no solid mass lesion. There is no mass effect or midline shift. Vascular: The major intracranial flow voids are present. Specifically, the bilateral V4 segments and basilar artery flow voids are normal. Skull and upper cervical spine: Normal marrow signal. Sinuses/Orbits: The paranasal sinuses are clear. The globes and orbits are unremarkable. Other: None. IMPRESSION: 1. Small acute to early subacute infarct in the left aspect of the medulla. No associated hemorrhage or mass effect. 2. Background moderate chronic white matter microangiopathy, progressed since 2019, and small remote lacunar infarcts in the bilateral basal  ganglia and right caudate head. Electronically Signed   By: Valetta Mole M.D.   On: 03/23/2022 15:39   ? ?Procedures ?Procedures  ? ? ?Medications Ordered in ED ?Medications  ?sodium chloride 0.9 % bolus 1,000 m

## 2022-03-23 NOTE — ED Notes (Signed)
Pt requesting to take home BP medications she has with her. Per Dr. Melina Copa pt can take home BP meds. ?

## 2022-03-23 NOTE — Discharge Instructions (Addendum)
You were seen in the emergency department for feeling lightheaded like you might pass out and elevated blood sugar.  You had lab work done that showed your sugar to be elevated and you to be slightly dehydrated.  Please  continue to hydrate at home and continue your regular medications.  Return to the emergency department if any worsening or concerning symptoms. ?

## 2022-03-23 NOTE — Progress Notes (Signed)
Patient is alert and oriented. Admitted to 5W 28, gait slow but steady (standby) to [facility]. Made  familiar with surroundings. Call bell placed within patients reach. ?

## 2022-03-23 NOTE — Progress Notes (Signed)
The patient stated she feels like she's choking after she ate. She also c/o severe H/A of 10/10 on a pain scale unrelieved with Tylenol 650 mg. Notified Dr. Marlowe Sax and received Morphine 1 mg IV x one dose.  ? ?

## 2022-03-23 NOTE — ED Triage Notes (Signed)
Patient states she woke up 4 days ago and states she has felt like she was going to pass out and has felt that way x 4 days. Patient also c/o nausea. ?

## 2022-03-24 ENCOUNTER — Inpatient Hospital Stay (HOSPITAL_COMMUNITY): Payer: Medicare Other

## 2022-03-24 DIAGNOSIS — I6389 Other cerebral infarction: Secondary | ICD-10-CM

## 2022-03-24 DIAGNOSIS — I10 Essential (primary) hypertension: Secondary | ICD-10-CM

## 2022-03-24 DIAGNOSIS — E1169 Type 2 diabetes mellitus with other specified complication: Secondary | ICD-10-CM | POA: Diagnosis not present

## 2022-03-24 DIAGNOSIS — E782 Mixed hyperlipidemia: Secondary | ICD-10-CM | POA: Diagnosis not present

## 2022-03-24 DIAGNOSIS — E038 Other specified hypothyroidism: Secondary | ICD-10-CM

## 2022-03-24 DIAGNOSIS — I6381 Other cerebral infarction due to occlusion or stenosis of small artery: Secondary | ICD-10-CM

## 2022-03-24 DIAGNOSIS — E785 Hyperlipidemia, unspecified: Secondary | ICD-10-CM | POA: Diagnosis not present

## 2022-03-24 DIAGNOSIS — Z794 Long term (current) use of insulin: Secondary | ICD-10-CM

## 2022-03-24 LAB — ECHOCARDIOGRAM COMPLETE
Area-P 1/2: 3.37 cm2
Height: 62 in
S' Lateral: 2.6 cm
Weight: 2560 oz

## 2022-03-24 LAB — HEMOGLOBIN A1C
Hgb A1c MFr Bld: 9.5 % — ABNORMAL HIGH (ref 4.8–5.6)
Mean Plasma Glucose: 225.95 mg/dL

## 2022-03-24 LAB — LIPID PANEL
Cholesterol: 219 mg/dL — ABNORMAL HIGH (ref 0–200)
HDL: 41 mg/dL (ref >40)
LDL Cholesterol: 142 mg/dL — ABNORMAL HIGH (ref 0–99)
Total CHOL/HDL Ratio: 5.3 RATIO
Triglycerides: 182 mg/dL — ABNORMAL HIGH (ref <150)
VLDL: 36 mg/dL (ref 0–40)

## 2022-03-24 LAB — GLUCOSE, CAPILLARY
Glucose-Capillary: 393 mg/dL — ABNORMAL HIGH (ref 70–99)
Glucose-Capillary: 369 mg/dL — ABNORMAL HIGH (ref 70–99)
Glucose-Capillary: 280 mg/dL — ABNORMAL HIGH (ref 70–99)
Glucose-Capillary: 320 mg/dL — ABNORMAL HIGH (ref 70–99)
Glucose-Capillary: 292 mg/dL — ABNORMAL HIGH (ref 70–99)
Glucose-Capillary: 291 mg/dL — ABNORMAL HIGH (ref 70–99)

## 2022-03-24 LAB — HIV ANTIBODY (ROUTINE TESTING W REFLEX): HIV Screen 4th Generation wRfx: NONREACTIVE

## 2022-03-24 MED ORDER — OSMOLITE 1.2 CAL PO LIQD: 1000.0000 mL | ORAL | Status: DC

## 2022-03-24 MED ORDER — INSULIN GLARGINE-YFGN 100 UNIT/ML ~~LOC~~ SOLN
12.0000 [IU] | Freq: Every day | SUBCUTANEOUS | Status: DC
  Administered 2022-03-24 – 2022-03-28 (×5): 12 [IU] via SUBCUTANEOUS
  Filled 2022-03-24 (×6): qty 0.12

## 2022-03-24 MED ORDER — FREE WATER
150.0000 mL | Status: DC
  Administered 2022-03-24 (×3): 150 mL

## 2022-03-24 MED ORDER — PANTOPRAZOLE SODIUM 40 MG IV SOLR
40.0000 mg | Freq: Once | INTRAVENOUS | Status: AC
  Administered 2022-03-25: 40 mg via INTRAVENOUS
  Filled 2022-03-24: qty 10

## 2022-03-24 MED ORDER — PROSOURCE TF PO LIQD
45.0000 mL | Freq: Two times a day (BID) | ORAL | Status: DC
  Administered 2022-03-24: 45 mL
  Filled 2022-03-24: qty 45

## 2022-03-24 MED ORDER — TICAGRELOR 90 MG PO TABS
90.0000 mg | ORAL_TABLET | Freq: Two times a day (BID) | ORAL | Status: DC
  Administered 2022-03-24: 90 mg
  Filled 2022-03-24 (×2): qty 1

## 2022-03-24 MED ORDER — SALINE SPRAY 0.65 % NA SOLN
1.0000 | NASAL | Status: DC | PRN
  Administered 2022-03-25: 1 via NASAL
  Filled 2022-03-24: qty 44

## 2022-03-24 MED ORDER — JEVITY 1.5 CAL/FIBER PO LIQD
1000.0000 mL | ORAL | Status: DC
  Administered 2022-03-24: 1000 mL
  Filled 2022-03-24 (×2): qty 1000

## 2022-03-24 MED ORDER — ENOXAPARIN SODIUM 40 MG/0.4ML IJ SOSY
40.0000 mg | PREFILLED_SYRINGE | INTRAMUSCULAR | Status: DC
  Administered 2022-03-24: 40 mg via SUBCUTANEOUS
  Filled 2022-03-24: qty 0.4

## 2022-03-24 MED ORDER — ASPIRIN 81 MG PO CHEW: 81.0000 mg | CHEWABLE_TABLET | Freq: Every day | ORAL | Status: DC

## 2022-03-24 NOTE — Significant Event (Signed)
Rapid Response Event Note  ? ?Reason for Call :  ?Bleeding from mouth-most likely from Cortrac insertion earlier today. ? ?Initial Focused Assessment:  ?Pt lying in bed with eyes open, in no distress. She is alert and oriented, c/o bleeding from her mouth. She says this started after her Cortrac tube was inserted on day shift. She has a strong cough. Lungs clear and diminished. Skin warm to touch. ? ?HR-89, BP-181/85, RR-18, SpO2-97% on RA. ? ?Interventions:  ?CBC ?BMP ?PT/INR ?PTT ?D/C /Cortrac ?Afrin nasal spray ? ?Plan of Care:  ?D/C cortrac tube. Afrin spray to nose and then place afrin-soaked gauze. Await lab values. Hold blood thinners until bleeding subsides. Continue to monitor pt closely. Call RRT if further assistance needed.  ? ?Event Summary:  ? ?MD Notified: Dr Marlowe Sax ?Call Time:2240 ?Arrival [id number] ?End Time:2340 ? ?Dillard Essex, RN ?

## 2022-03-24 NOTE — Progress Notes (Addendum)
STROKE TEAM PROGRESS NOTE  ? ?INTERVAL HISTORY ?Patient is seen in her room with no family at the bedside.  Yesterday, she experienced vertigo, imbalance, hoarseness of the voice and difficulty swallowing.  MRI reveals a left medullary stroke.  MR angiograms are pending.  LDL cholesterol is elevated 142 mg percent and hemoglobin A1c at 9.5.  Echocardiogram shows normal ejection fraction. ? ?Vitals:  ? 03/24/22 0400 03/24/22 0507 03/24/22 0800 03/24/22 1240  ?BP:  (!) 175/70 (!) 186/72 (!) 182/78  ?Pulse: 99 85 83 82  ?Resp: '20 17 19 16  '$ ?Temp:  97.9 ?F (36.6 ?C) 98 ?F (36.7 ?C) 98.2 ?F (36.8 ?C)  ?TempSrc:  Oral Oral Oral  ?SpO2: 93% 93% 95% 94%  ?Weight:      ?Height:      ? ?CBC:  ?Recent Labs  ?Lab 03/23/22 ?0738  ?WBC 6.3  ?HGB 11.7*  ?HCT 36.2  ?MCV 88.7  ?PLT 314  ? ?Basic Metabolic Panel:  ?Recent Labs  ?Lab 03/23/22 ?0738  ?NA 136  ?K 4.1  ?CL 102  ?CO2 23  ?GLUCOSE 382*  ?BUN 27*  ?CREATININE 1.41*  ?CALCIUM 9.9  ? ?Lipid Panel:  ?Recent Labs  ?Lab 03/24/22 ?0214  ?CHOL 219*  ?TRIG 182*  ?HDL 41  ?CHOLHDL 5.3  ?VLDL 36  ?[place] 142*  ? ?HgbA1c:  ?Recent Labs  ?Lab 03/24/22 ?0214  ?HGBA1C 9.5*  ? ?Urine Drug Screen: No results for input(s): LABOPIA, COCAINSCRNUR, LABBENZ, AMPHETMU, THCU, LABBARB in the last 168 hours.  ?Alcohol Level No results for input(s): ETH in the last 168 hours. ? ?IMAGING past 24 hours ?MR BRAIN WO CONTRAST ? ?Result Date: 03/23/2022 ?CLINICAL DATA:  Woke up 4 days ago feeling like she was going to pass out, persisted. Nausea. Evaluate for stroke EXAM: MRI HEAD WITHOUT CONTRAST TECHNIQUE: Multiplanar, multiecho pulse sequences of the brain and surrounding structures were obtained without intravenous contrast. COMPARISON:  MR head 02/16/2018 FINDINGS: Brain: There are small foci of patchy diffusion restriction in the left aspect of the medulla consistent with acute to early subacute infarct (5-58). There is no associated hemorrhage or mass effect. There is no other acute infarct. There  is no acute intracranial hemorrhage or extra-axial fluid collection. Background parenchymal volume is normal. The ventricles are normal in size. Patchy and confluent FLAIR signal abnormality throughout the subcortical and periventricular white matter likely reflects sequela of moderate chronic white matter microangiopathy, progressed since 2019. There are small remote lacunar infarcts in the bilateral basal ganglia. There is no solid mass lesion. There is no mass effect or midline shift. Vascular: The major intracranial flow voids are present. Specifically, the bilateral V4 segments and basilar artery flow voids are normal. Skull and upper cervical spine: Normal marrow signal. Sinuses/Orbits: The paranasal sinuses are clear. The globes and orbits are unremarkable. Other: None. IMPRESSION: 1. Small acute to early subacute infarct in the left aspect of the medulla. No associated hemorrhage or mass effect. 2. Background moderate chronic white matter microangiopathy, progressed since 2019, and small remote lacunar infarcts in the bilateral basal ganglia and right caudate head. Electronically Signed   By: Valetta Mole M.D.   On: 03/23/2022 15:39  ? ?DG Swallowing Func-Speech Pathology ? ?Result Date: 03/24/2022 ?Table formatting from the original result was not included. Objective Swallowing Evaluation: Type of Study: MBS-Modified Barium Swallow Study  Patient Details Name: [name] MRN: [id number] Date of Birth: [birth year] Today's Date: 03/24/2022 Time: SLP Start Time (ACUTE ONLY): 0175 -SLP Stop Time (ACUTE  ONLY): 1215 SLP Time Calculation (min) (ACUTE ONLY): 16 min Past Medical History: Past Medical History: Diagnosis Date  Anemia   Arteriosclerotic cardiovascular disease   CKD (chronic kidney disease) stage 3, GFR 30-59 ml/min (HCC)   Claudication in peripheral vascular disease (HCC)   CVA (cerebral vascular accident) ([place])   2012 and 2019-affected left side + dysarthria 2/2 severe R ACA and MCA stenosis  GERD  (gastroesophageal reflux disease)   Hyperlipidemia   declines statins  Hypertension   Hypothyroidism   Type 2 diabetes mellitus ([place]) 2000  Vitamin D deficiency  Past Surgical History: Past Surgical History: Procedure Laterality Date  CESAREAN SECTION    COLONOSCOPY  09/21/2020  LEFT HEART CATHETERIZATION WITH CORONARY ANGIOGRAM N/A 03/08/2012  Procedure: LEFT HEART CATHETERIZATION WITH CORONARY ANGIOGRAM;  Surgeon: Sinclair Grooms, MD;  Location: [facility] CATH LAB;  Service: Cardiovascular;  Laterality: N/A;  LEFT HEART CATHETERIZATION WITH CORONARY ANGIOGRAM N/A 12/17/2012  Procedure: LEFT HEART CATHETERIZATION WITH CORONARY ANGIOGRAM;  Surgeon: Laverda Page, MD;  Location: [facility] CATH LAB;  Service: Cardiovascular;  Laterality: N/A;  LOOP RECORDER INSERTION N/A 03/05/2018  Procedure: LOOP RECORDER INSERTION;  Surgeon: Evans Lance, MD;  Location: [place] CV LAB;  Service: Cardiovascular;  Laterality: N/A;  LOWER EXTREMITY ANGIOGRAM N/A 12/17/2012  Procedure: LOWER EXTREMITY ANGIOGRAM;  Surgeon: Laverda Page, MD;  Location: [facility] CATH LAB;  Service: Cardiovascular;  Laterality: N/A;  TEE WITHOUT CARDIOVERSION N/A 02/28/2018  Procedure: TRANSESOPHAGEAL ECHOCARDIOGRAM (TEE);  Surgeon: Jerline Pain, MD;  Location: [facility] ENDOSCOPY;  Service: Cardiovascular;  Laterality: N/A;  TUBAL LIGATION   HPI: Pt is a 71 y.o. female who presenting with weakness and lightheadedness. MRI 5/11: Small acute to early subacute infarct in the left aspect of the  medulla. PMH: CVA, DM2, HTN, CKD3b, HLD.  No data recorded  Recommendations for follow up therapy are one component of a multi-disciplinary discharge planning process, led by the attending physician.  Recommendations may be updated based on patient status, additional functional criteria and insurance authorization. Assessment / Plan / Recommendation   03/24/2022  11:59 AM Clinical Impressions Clinical Impression Pt presents with neurogenic pharyngeal dysphagia characterized by  reduced tongue base retraction, reduced pharyngeal constriction, and reduced anterior laryngeal movement. She demonstrated incomplete epiglottic inversion, vallecular residue, pyriform sinus residue, posterior pharyngeal wall residue, and PES distension was reduced. Pt was unable to achieve complete epiglottic inversion with secondary swallows and the epiglottis was noted to move only to about a horizontal position. Penetration (PAS 3) and aspiration (PAS 7) of pyriform sinus residue was noted inconsistently with multiple consistencies. Pt's cough was effective in expelling the aspirate, but subsequent laryngeal invasion was noted thereafter due to inadequate laryngeal closure and pharyngeal pressure. Effortful swallows as well as a chin tuck posture both reduced pharyngeal residue, but not to a functional level. Residue was most significant with nectar thick liquids and with purees. Pt reported these to be the most noxious with regard to effort and pharyngeal "soreness/burning". Secondary swallows were notably less effective in facilitating pharyngeal clearance of purees and nectar thick liquids, but residue from these was reduced with a thin liquid wash. Esophageal sweep revealed some stasis and slowed movement of barium in the distal esophagus. It is recommended that pt's NPO status be maintained and that short-term enteral nutrition be initiated. Pt may have ice chips after oral care to maintain the integrity of the oral mucosa and for use practice of dysphagia exercises. SLP will follow pt for dysphagia intervention.  SLP Visit Diagnosis Dysphagia, pharyngeal phase (R13.13) Impact on safety and function Moderate aspiration risk;Severe aspiration risk     03/24/2022  11:59 AM Treatment Recommendations Treatment Recommendations Therapy as outlined in treatment plan below     03/24/2022  11:59 AM Prognosis Prognosis for Safe Diet Advancement Good Barriers to Reach Goals Cognitive deficits   03/24/2022  11:59 AM Diet  Recommendations SLP Diet Recommendations NPO;Ice chips PRN after oral care Medication Administration Via alternative means Postural Changes Seated upright at 90 degrees     03/24/2022  11:59 AM Other Recomm

## 2022-03-24 NOTE — Progress Notes (Addendum)
?      ?                 PROGRESS NOTE ? ?      ?PATIENT DETAILS ?Name: [name] ?Age: 71 y.o. ?Sex: female ?Date of Birth: 20-Aug-1951 ?Admit Date: 03/23/2022 ?Admitting Physician Jonnie Finner, DO ?[id number], Jimmy Picket, MD (Inactive) ? ?Brief Summary: ?Patient is a 71 y.o.  female with prior history of CVA, DM-2, HTN, HLD, CKD stage IIIb-who presented with vertigo/dysphagia-further work-up revealed acute CVA involving left Medulla ? ?Significant events: ?5/11>> presented to the ED with vertigo-CVA involving left Medulla ? ?Significant studies: ?5/11>> acute infarct-left Medulla ?5/12>> A1c:9.5 ?5/12>> LDL: 142 ? ?Significant microbiology data: ? ? ?Procedures: ?None ? ?Consults: ?Neurology ? ?Subjective: ?Complains of difficulty swallowing-claims food gets stuck in upper throat area.  Complete continues to complain of vertigo.  Denies any diplopia. ? ?Objective: ?Vitals: ?Blood pressure (!) 186/72, pulse 83, temperature 98 ?F (36.7 ?C), temperature source Oral, resp. rate 19, height '5\' 2"'$  (1.575 m), weight 72.6 kg, SpO2 95 %.  ? ?Exam: ?Gen Exam:Alert awake-not in any distress ?HEENT:atraumatic, normocephalic ?Chest: B/L clear to auscultation anteriorly ?CVS:S1S2 regular ?Abdomen:soft non tender, non distended ?Extremities:no edema ?Neurology: Non focal ?Skin: no rash ? ?Pertinent Labs/Radiology: ? ?  Latest Ref Rng & Units 03/23/2022  ?  7:38 AM 03/27/2018  ?  4:43 PM 03/04/2018  ?  6:29 AM  ?CBC  ?WBC 4.0 - 10.5 K/uL 6.3   4.9   5.4    ?Hemoglobin 12.0 - 15.0 g/dL 11.7   10.9   9.7    ?Hematocrit 36.0 - 46.0 % 36.2   33.8   29.0    ?Platelets 150 - 400 K/uL 314   304   326    ?  ?Lab Results  ?Component Value Date  ? NA 136 03/23/2022  ? K 4.1 03/23/2022  ? CL 102 03/23/2022  ? CO2 23 03/23/2022  ?  ? ? ?Assessment/Plan: ?Acute left medullary infarct: Continues to have vertigo-complaining of dysphagia this morning.  Awaiting echo and MRA head/neck.  Discussed with EP service-they will interrogate  loop recorder-to rule out A-fib but suspect this is probably from small vessel disease.  Recommendations are for dual antiplatelets x3 weeks-however patient n.p.o.-and going for MBS later today.  Await further recommendations from stroke team. ? ?Addendum: d/w EP service-Renee Ursay PA-C-no afb on loop recorder. ? ?Dysphagia: Likely due to above-SLP following-for MBS later today-if remains n.p.o. post MBS- will require cortrak tube. ? ?HTN: Allow permissive hypertension.  Resume antihypertensives over the next few days. ? ?HLD: Currently n.p.o. apparently intolerant to statins in the past-resume gemfibrozil after MBS-if able to swallow or through NG tube. ? ?DM-2: CBGs uncontrolled-currently n.p.o.-and due for swallowing study later today-we will start low-dose Semglee-and optimize the dose further once diet is resumed or after she gets a NG tube placed. ? ?Recent Labs  ?  03/23/22 ?0749 03/23/22 ?2048 03/24/22 ?0759  ?GLUCAP 354* [place] ?CKD stage IIIb: At baseline ? ?Hypothyroidism: Continue Synthroid-check TSH with am labs ? ?BMI: ?Estimated body mass index is 29.26 kg/m? as calculated from the following: ?  Height as of this encounter: '5\' 2"'$  (1.575 m). ?  Weight as of this encounter: 72.6 kg.  ? ?Code status: ?  Code Status: Partial Code  ? ?DVT Prophylaxis: ?SCD's Start: 03/23/22 1948 ?SQ Lovenox ?  ?Family Communication: Daughter-[name] Bryant-[phone]-updated over the phone on 5/12 ? ? ?Disposition Plan: ?Status  is: Inpatient ?Remains inpatient appropriate because: Acute CVA-work-up in progress-May require NG tube placement for dysphagia. ?  ?Planned Discharge Destination:Home health versus SNF ? ? ?Diet: ?Diet Order   ? ?       ?  Diet NPO time specified  Diet effective now       ?  ? ?  ?  ? ?  ?  ? ? ?Antimicrobial agents: ?Anti-infectives (From admission, onward)  ? ? None  ? ?  ? ? ? ?MEDICATIONS: ?Scheduled Meds: ? aspirin  300 mg Rectal Daily  ? Or  ? aspirin  325 mg Oral Daily  ?  enoxaparin (LOVENOX) injection  40 mg Subcutaneous Q24H  ? gemfibrozil  600 mg Oral Daily  ? insulin aspart  0-20 Units Subcutaneous TID WC  ? insulin aspart  0-5 Units Subcutaneous QHS  ? levothyroxine  88 mcg Oral QAC breakfast  ? ?Continuous Infusions: ?PRN Meds:.acetaminophen **OR** acetaminophen (TYLENOL) oral liquid 160 mg/5 mL **OR** acetaminophen, senna-docusate ? ? ?I have personally reviewed following labs and imaging studies ? ?LABORATORY DATA: ?CBC: ?Recent Labs  ?Lab 03/23/22 ?0738  ?WBC 6.3  ?HGB 11.7*  ?HCT 36.2  ?MCV 88.7  ?PLT 314  ? ? ?Basic Metabolic Panel: ?Recent Labs  ?Lab 03/23/22 ?0738  ?NA 136  ?K 4.1  ?CL 102  ?CO2 23  ?GLUCOSE 382*  ?BUN 27*  ?CREATININE 1.41*  ?CALCIUM 9.9  ? ? ?GFR: ?Estimated Creatinine Clearance: 34.6 mL/min (A) (by C-G formula based on SCr of 1.41 mg/dL (H)). ? ?Liver Function Tests: ?No results for input(s): AST, ALT, ALKPHOS, BILITOT, PROT, ALBUMIN in the last 168 hours. ?No results for input(s): LIPASE, AMYLASE in the last 168 hours. ?No results for input(s): AMMONIA in the last 168 hours. ? ?Coagulation Profile: ?No results for input(s): INR, PROTIME in the last 168 hours. ? ?Cardiac Enzymes: ?No results for input(s): CKTOTAL, CKMB, CKMBINDEX, TROPONINI in the last 168 hours. ? ?BNP (last 3 results) ?No results for input(s): PROBNP in the last 8760 hours. ? ?Lipid Profile: ?Recent Labs  ?  03/24/22 ?0214  ?CHOL 219*  ?HDL 41  ?LDLCALC 142*  ?TRIG 182*  ?CHOLHDL 5.3  ? ? ?Thyroid Function Tests: ?No results for input(s): TSH, T4TOTAL, FREET4, T3FREE, THYROIDAB in the last 72 hours. ? ?Anemia Panel: ?No results for input(s): VITAMINB12, FOLATE, FERRITIN, TIBC, IRON, RETICCTPCT in the last 72 hours. ? ?Urine analysis: ?   ?Component Value Date/Time  ? COLORURINE YELLOW 03/23/2022 1158  ? APPEARANCEUR CLEAR 03/23/2022 1158  ? LABSPEC 1.013 03/23/2022 1158  ? PHURINE 6.0 03/23/2022 1158  ? GLUCOSEU >=500 (A) 03/23/2022 1158  ? HGBUR NEGATIVE 03/23/2022 1158  ?  BILIRUBINUR NEGATIVE 03/23/2022 1158  ? BILIRUBINUR neg 02/06/2018 1620  ? KETONESUR NEGATIVE 03/23/2022 1158  ? PROTEINUR 100 (A) 03/23/2022 1158  ? UROBILINOGEN 0.2 02/06/2018 1620  ? UROBILINOGEN 0.2 05/21/2015 2131  ? NITRITE NEGATIVE 03/23/2022 1158  ? LEUKOCYTESUR NEGATIVE 03/23/2022 1158  ? ? ?Sepsis Labs: ?Lactic Acid, Venous ?   ?Component Value Date/Time  ? LATICACIDVEN 0.63 08/11/2015 1612  ? ? ?MICROBIOLOGY: ?No results found for this or any previous visit (from the past 240 hour(s)). ? ?RADIOLOGY STUDIES/RESULTS: ?MR BRAIN WO CONTRAST ? ?Result Date: 03/23/2022 ?CLINICAL DATA:  Woke up 4 days ago feeling like she was going to pass out, persisted. Nausea. Evaluate for stroke EXAM: MRI HEAD WITHOUT CONTRAST TECHNIQUE: Multiplanar, multiecho pulse sequences of the brain and surrounding structures were obtained without intravenous contrast. COMPARISON:  MR head 02/16/2018 FINDINGS: Brain: There are small foci of patchy diffusion restriction in the left aspect of the medulla consistent with acute to early subacute infarct (5-58). There is no associated hemorrhage or mass effect. There is no other acute infarct. There is no acute intracranial hemorrhage or extra-axial fluid collection. Background parenchymal volume is normal. The ventricles are normal in size. Patchy and confluent FLAIR signal abnormality throughout the subcortical and periventricular white matter likely reflects sequela of moderate chronic white matter microangiopathy, progressed since 2019. There are small remote lacunar infarcts in the bilateral basal ganglia. There is no solid mass lesion. There is no mass effect or midline shift. Vascular: The major intracranial flow voids are present. Specifically, the bilateral V4 segments and basilar artery flow voids are normal. Skull and upper cervical spine: Normal marrow signal. Sinuses/Orbits: The paranasal sinuses are clear. The globes and orbits are unremarkable. Other: None. IMPRESSION: 1.  Small acute to early subacute infarct in the left aspect of the medulla. No associated hemorrhage or mass effect. 2. Background moderate chronic white matter microangiopathy, progressed since 2019, and small remote lacunar i

## 2022-03-24 NOTE — Procedures (Signed)
Cortrak  Person Inserting Tube:  Pia Jedlicka C, RD Tube Type:  Cortrak - 43 inches Tube Size:  10 Tube Location:  Left nare Secured by: Bridle Technique Used to Measure Tube Placement:  Marking at nare/corner of mouth Cortrak Secured At:  70 cm   Cortrak Tube Team Note:  Consult received to place a Cortrak feeding tube.   X-ray is required, abdominal x-ray has been ordered by the Cortrak team. Please confirm tube placement before using the Cortrak tube.   If the tube becomes dislodged please keep the tube and contact the Cortrak team at [email or web address] (password [email or web address]) for replacement.  If after hours and replacement cannot be delayed, place a NG tube and confirm placement with an abdominal x-ray.    [name] P., RD, LDN, CNSC See AMiON for contact information    

## 2022-03-24 NOTE — Evaluation (Signed)
Clinical/Bedside Swallow Evaluation ?Patient Details  ?Name: [name] ?MRN: [id number] ?Date of Birth: 27-Mar-1951 ? ?Today's Date: 03/24/2022 ?Time: SLP Start Time (ACUTE ONLY): [id number] SLP Stop Time (ACUTE ONLY): 6226 ?SLP Time Calculation (min) (ACUTE ONLY): 13 min ? ?Past Medical History:  ?Past Medical History:  ?Diagnosis Date  ? Anemia   ? Arteriosclerotic cardiovascular disease   ? CKD (chronic kidney disease) stage 3, GFR 30-59 ml/min (HCC)   ? Claudication in peripheral vascular disease ([place])   ? CVA (cerebral vascular accident) [facility])   ? 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 GERD (gastroesophageal reflux disease)   ? Hyperlipidemia   ? declines statins  ? Hypertension   ? Hypothyroidism   ? Type 2 diabetes mellitus ([place]) 2000  ? Vitamin D deficiency   ? ?Past Surgical History:  ?Past Surgical History:  ?Procedure Laterality Date  ? CESAREAN SECTION    ? COLONOSCOPY  09/21/2020  ? LEFT HEART CATHETERIZATION WITH CORONARY ANGIOGRAM N/A 03/08/2012  ? Procedure: LEFT HEART CATHETERIZATION WITH CORONARY ANGIOGRAM;  Surgeon: Sinclair Grooms, MD;  Location: [facility] CATH LAB;  Service: Cardiovascular;  Laterality: N/A;  ? LEFT HEART CATHETERIZATION WITH CORONARY ANGIOGRAM N/A 12/17/2012  ? Procedure: LEFT HEART CATHETERIZATION WITH CORONARY ANGIOGRAM;  Surgeon: Laverda Page, MD;  Location: [facility] CATH LAB;  Service: Cardiovascular;  Laterality: N/A;  ? LOOP RECORDER INSERTION N/A 03/05/2018  ? Procedure: LOOP RECORDER INSERTION;  Surgeon: Evans Lance, MD;  Location: [place] CV LAB;  Service: Cardiovascular;  Laterality: N/A;  ? LOWER EXTREMITY ANGIOGRAM N/A 12/17/2012  ? Procedure: LOWER EXTREMITY ANGIOGRAM;  Surgeon: Laverda Page, MD;  Location: [facility] CATH LAB;  Service: Cardiovascular;  Laterality: N/A;  ? TEE WITHOUT CARDIOVERSION N/A 02/28/2018  ? Procedure: TRANSESOPHAGEAL ECHOCARDIOGRAM (TEE);  Surgeon: Jerline Pain, MD;  Location: [facility] ENDOSCOPY;  Service:  Cardiovascular;  Laterality: N/A;  ? TUBAL LIGATION    ? ?HPI:  ?Pt is a 71 y.o. female who presenting with weakness and lightheadedness. MRI 5/11: Small acute to early subacute infarct in the left aspect of the  medulla. PMH: CVA, DM2, HTN, CKD3b, HLD.  ?  ?Assessment / Plan / Recommendation  ?Clinical Impression ? Pt was seen for bedside swallow evaluation and she denied a history of dysphagia, but stated that since yesterday she has had trouble "getting things down" and feels as though they "get stuck".  Dentition was limited with absent mandibular teeth and limited maxillary dentition. She tolerated all solids and liquids without signs or symptoms of aspiration. However, she demonstrated repetitive superior-inferior hyolaryngeal movement following bolus administration, and multiple swallows were noted after full hyolaryngeal movement was achieved. Difficulty with swallow initiation and pharyngeal residue are suspected. A modified barium swallow study is recommended to further assess physiology. It is recommended that the pt's NPO status be maintained until it is completed at 1200.  ?SLP Visit Diagnosis: Dysphagia, unspecified (R13.10) ?   ?Aspiration Risk ? Mild aspiration risk  ?  ?Diet Recommendation NPO  ? ?Medication Administration: Via alternative means  ?  ?Other  Recommendations Oral Care Recommendations: Oral care BID   ? ?Recommendations for follow up therapy are one component of a multi-disciplinary discharge planning process, led by the attending physician.  Recommendations may be updated based on patient status, additional functional criteria and insurance authorization. ? ?Follow up Recommendations  (Continued SLP services at level of care recommended by PT/OT)  ? ? ?  ?  Assistance Recommended at Discharge Intermittent Supervision/Assistance  ?Functional Status Assessment Patient has had a recent decline in their functional status and demonstrates the ability to make significant improvements in  function in a reasonable and predictable amount of time.  ?Frequency and Duration min 2x/week  ?2 weeks ?  ?   ? ?Prognosis Prognosis for Safe Diet Advancement: Good ?Barriers to Reach Goals: Cognitive deficits  ? ?  ? ?Swallow Study   ?General Date of Onset: 03/24/22 ?HPI: Pt is a 71 y.o. female who presenting with weakness and lightheadedness. MRI 5/11: Small acute to early subacute infarct in the left aspect of the  medulla. PMH: CVA, DM2, HTN, CKD3b, HLD. ?Type of Study: Bedside Swallow Evaluation ?Previous Swallow Assessment: none ?Diet Prior to this Study: NPO ?Temperature Spikes Noted: No ?Respiratory Status: Room air ?History of Recent Intubation: No ?Behavior/Cognition: Alert;Cooperative;Pleasant mood ?Oral Cavity Assessment: Within Functional Limits ?Oral Care Completed by SLP: No ?Oral Cavity - Dentition: Missing dentition ?Vision: Functional for self-feeding ?Self-Feeding Abilities: Able to feed self ?Patient Positioning: Upright in bed;Postural control adequate for testing ?Baseline Vocal Quality: Hoarse ?Volitional Cough: Strong ?Volitional Swallow: Able to elicit  ?  ?Oral/Motor/Sensory Function Overall Oral Motor/Sensory Function: Mild impairment ?Facial ROM: Within Functional Limits ?Facial Symmetry: Within Functional Limits ?Facial Strength: Within Functional Limits ?Lingual ROM: Reduced left;Within Functional Limits ?Lingual Strength: Reduced;Suspected CN XII (hypoglossal) dysfunction   ?Ice Chips Ice chips: Within functional limits ?Presentation: Spoon   ?Thin Liquid Thin Liquid: Impaired ?Presentation: Straw ?Pharyngeal  Phase Impairments: Multiple swallows  ?  ?Nectar Thick Nectar Thick Liquid: Not tested   ?Honey Thick Honey Thick Liquid: Not tested   ?Puree Puree: Impaired ?Presentation: Spoon ?Pharyngeal Phase Impairments: Multiple swallows   ?Solid ? ? ?  Solid: Not tested  ? ?  ?Gearl Baratta I. Hardin Negus, [place], CCC-SLP ?Acute Rehabilitation Services ?Office number [phone] ?Pager  [phone] ? ?Horton Marshall ?03/24/2022,10:14 AM ? ? ? ? ?  ?

## 2022-03-24 NOTE — Progress Notes (Signed)
At shift change,  I noted 100 cc of blood in the canister from patient suctioning her mouth with a yankauer. The bleeding is still going on and it's now 150 cc in the canister. Paged the Neurologist, Dr Lorrin Goodell and he indicated to call [facility] hospitalist. Called [facility] and talked to [facility] and she's is at the bedside now. I also paged Dr. Marlowe Sax and awaiting a call back. No acute distress noted. Patient is A & o x 4 and she's not in any distress. Will continue to monitor. ?

## 2022-03-24 NOTE — Progress Notes (Signed)
?  Echocardiogram ?2D Echocardiogram has been performed. ? ?[name] ?03/24/2022, 11:24 AM ?

## 2022-03-24 NOTE — Progress Notes (Addendum)
PT Cancellation Note ? ?Patient Details ?Name: [name] ?MRN: [id number] ?DOB: May 09, 1951 ? ? ?Cancelled Treatment:    Reason Eval/Treat Not Completed: Patient at procedure or test/unavailable. Will re-attempt later today as time allows.  ? ?Addendum: re-attempted this pm and patient having procedure in room. Unavailable. Will re-attempt tomorrow.   ? ? ?[name] ?03/24/2022, 11:30 AM ?

## 2022-03-24 NOTE — Progress Notes (Signed)
Modified Barium Swallow Progress Note ? ?Patient Details  ?Name: [name] ?MRN: [id number] ?Date of Birth: May 18, 1951 ? ?Today's Date: 03/24/2022 ? ?Modified Barium Swallow completed.  Full report located under Chart Review in the Imaging Section. ? ?Brief recommendations include the following: ? ?Clinical Impression ? Pt presents with neurogenic pharyngeal dysphagia characterized by reduced tongue base retraction, reduced pharyngeal constriction, and reduced anterior laryngeal movement. She demonstrated incomplete epiglottic inversion, vallecular residue, pyriform sinus residue, posterior pharyngeal wall residue, and PES distension was reduced. Pt was unable to achieve complete epiglottic inversion with secondary swallows and the epiglottis was noted to move only to about a horizontal position. Penetration (PAS 3) and aspiration (PAS 7) of pyriform sinus residue was noted inconsistently with multiple consistencies. Pt's cough was effective in expelling the aspirate, but subsequent laryngeal invasion was noted thereafter due to inadequate laryngeal closure and pharyngeal pressure. Effortful swallows as well as a chin tuck posture both reduced pharyngeal residue, but not to a functional level. Residue was most significant with nectar thick liquids and with purees. Pt reported these to be the most noxious with regard to effort and pharyngeal "soreness/burning". Secondary swallows were notably less effective in facilitating pharyngeal clearance of purees and nectar thick liquids, but residue from these was reduced with a thin liquid wash. Esophageal sweep revealed some stasis and slowed movement of barium in the distal esophagus. It is recommended that pt's NPO status be maintained and that short-term enteral nutrition be initiated. Pt may have ice chips after oral care to maintain the integrity of the oral mucosa and for use practice of dysphagia exercises. SLP will follow pt for dysphagia intervention. ?   ?Swallow Evaluation Recommendations ? ?   ? ? SLP Diet Recommendations: NPO;Ice chips PRN after oral care ? ?   ? ? Medication Administration: Via alternative means ? ? Supervision: Patient able to self feed ? ?   ? ? Postural Changes: Seated upright at 90 degrees ? ? Oral Care Recommendations: Oral care BID ? ?   ? ?Anupama Piehl I. Hardin Negus, [place], CCC-SLP ?Acute Rehabilitation Services ?Office number [phone] ?Pager [phone] ? ? ?Horton Marshall ?03/24/2022,1:01 PM ?

## 2022-03-24 NOTE — Evaluation (Signed)
Speech Language Pathology Evaluation ?Patient Details ?Name: [name] ?MRN: [id number] ?DOB: 02/10/1951 ?Today's Date: 03/24/2022 ?Time: 0100-7121 ?SLP Time Calculation (min) (ACUTE ONLY): 19 min ? ?Problem List:  ?Patient Active Problem List  ? Diagnosis Date Noted  ? CVA (cerebral vascular accident) ([place]) 03/23/2022  ? Lymphadenopathy of head and neck 03/25/2019  ? CAD (coronary artery disease) 07/24/2018  ? Abdominal aortic aneurysm (AAA) without rupture ([place]) 07/24/2018  ? Abnormal uterine bleeding 05/22/2018  ? 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 Gingivitis 10/24/2017  ? GERD (gastroesophageal reflux disease) 06/01/2015  ? Normocytic anemia 06/01/2015  ? Healthcare maintenance 06/01/2015  ? Vitamin D deficiency 06/01/2015  ? Stage 3b chronic kidney disease (CKD) ([place]) 03/01/2014  ? PVD (peripheral vascular disease) ([place]) 06/18/2013  ? Hypothyroidism 01/10/2007  ? HLD (hyperlipidemia) 01/10/2007  ? HTN (hypertension) 01/10/2007  ? History of CVA (cerebrovascular accident) 01/10/2007  ? DM2 (diabetes mellitus, type 2) ([place]) 01/10/1989  ? ?Past Medical History:  ?Past Medical History:  ?Diagnosis Date  ? Anemia   ? Arteriosclerotic cardiovascular disease   ? CKD (chronic kidney disease) stage 3, GFR 30-59 ml/min (HCC)   ? Claudication in peripheral vascular disease ([place])   ? CVA (cerebral vascular accident) [facility])   ? 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 GERD (gastroesophageal reflux disease)   ? Hyperlipidemia   ? declines statins  ? Hypertension   ? Hypothyroidism   ? Type 2 diabetes mellitus ([place]) 2000  ? Vitamin D deficiency   ? ?Past Surgical History:  ?Past Surgical History:  ?Procedure Laterality Date  ? CESAREAN SECTION    ? COLONOSCOPY  09/21/2020  ? LEFT HEART CATHETERIZATION WITH CORONARY ANGIOGRAM N/A 03/08/2012  ? Procedure: LEFT HEART CATHETERIZATION WITH CORONARY ANGIOGRAM;  Surgeon: Sinclair Grooms, MD;   Location: [facility] CATH LAB;  Service: Cardiovascular;  Laterality: N/A;  ? LEFT HEART CATHETERIZATION WITH CORONARY ANGIOGRAM N/A 12/17/2012  ? Procedure: LEFT HEART CATHETERIZATION WITH CORONARY ANGIOGRAM;  Surgeon: Laverda Page, MD;  Location: [facility] CATH LAB;  Service: Cardiovascular;  Laterality: N/A;  ? LOOP RECORDER INSERTION N/A 03/05/2018  ? Procedure: LOOP RECORDER INSERTION;  Surgeon: Evans Lance, MD;  Location: [place] CV LAB;  Service: Cardiovascular;  Laterality: N/A;  ? LOWER EXTREMITY ANGIOGRAM N/A 12/17/2012  ? Procedure: LOWER EXTREMITY ANGIOGRAM;  Surgeon: Laverda Page, MD;  Location: [facility] CATH LAB;  Service: Cardiovascular;  Laterality: N/A;  ? TEE WITHOUT CARDIOVERSION N/A 02/28/2018  ? Procedure: TRANSESOPHAGEAL ECHOCARDIOGRAM (TEE);  Surgeon: Jerline Pain, MD;  Location: [facility] ENDOSCOPY;  Service: Cardiovascular;  Laterality: N/A;  ? TUBAL LIGATION    ? ?HPI:  ?Pt is a 71 y.o. female who presenting with weakness and lightheadedness. MRI 5/11: Small acute to early subacute infarct in the left aspect of the  medulla. PMH: CVA, DM2, HTN, CKD3b, HLD.  ? ?Assessment / Plan / Recommendation ?Clinical Impression ? Pt participated in speech-language-cognition evaluation. Pt reported that she has a high-school education, is retired, lives alone, and was independent with medication and financial management prior to admission. Pt denied any baseline deficits in speech, language, or cognition. She stated that her cognition is normal and that her voice is now hoarse, but she denied any change in articulation. The [facility] Mental Status Examination was completed to evaluate the pt's cognitive-linguistic skills. She achieved a score of 23/30 which is below the normal  limits of 27 or more out of 30 and is suggestive of a mild impairment. She exhibited difficulty in the areas of awareness, attention, complex problem solving and executive function. Pt's reported cognitive baseline could not be  corroborated with friends/family. Mild dysarthria was also noted characterized by reduced articulatory precision, a hoarse vocal quality, and reduced vocal intensity which together reduced speech intelligibility during conversation. Skilled SLP services are clinically indicated at this time. ?   ?SLP Assessment ? SLP Recommendation/Assessment: Patient needs continued [place] [facility] ?SLP Visit Diagnosis: Cognitive communication deficit (R41.841);Dysarthria and anarthria (R47.1)  ?  ?Recommendations for follow up therapy are one component of a multi-disciplinary discharge planning process, led by the attending physician.  Recommendations may be updated based on patient status, additional functional criteria and insurance authorization. ?   ?Follow Up Recommendations ?  (Continued SLP services at level of care recommended by PT/OT)  ?  ?Assistance Recommended at Discharge ? Intermittent Supervision/Assistance  ?Functional Status Assessment Patient has had a recent decline in their functional status and demonstrates the ability to make significant improvements in function in a reasonable and predictable amount of time.  ?Frequency and Duration min 2x/week  ?2 weeks ?  ?   ?SLP Evaluation ?Cognition ? Overall Cognitive Status: No family/caregiver present to determine baseline cognitive functioning ?Arousal/Alertness: Awake/alert ?Orientation Level: Oriented X4 ?Year: 2023 ?Month: May ?Day of Week: Correct ?Attention: Focused;Sustained ?Focused Attention: Appears intact ?Sustained Attention: Impaired ?Sustained Attention Impairment: Verbal complex ?Memory: Appears intact (Immediate: 5/5; delayed: 5/5; paragraph: 6/8) ?Awareness: Impaired ?Awareness Impairment: Emergent impairment ?Problem Solving: Impaired ?Problem Solving Impairment: Verbal complex (Money: 1/3; time: 0/2) ?Executive Function: Sequencing;Organizing ?Sequencing: Appears intact (Clock: 4/4) ?Organizing: Impaired ?Organizing Impairment:  Verbal complex (Backward digit span: 0/2)  ?  ?   ?Comprehension ? Auditory Comprehension ?Overall Auditory Comprehension: Appears within functional limits for tasks assessed ?Yes/No Questions: Within Functional Limits ?Commands: Within Functional Limits ?Conversation: Complex  ?  ?Expression Expression ?Primary Mode of Expression: Verbal ?Verbal Expression ?Overall Verbal Expression: Appears within functional limits for tasks assessed ?Initiation: No impairment ?Level of Generative/Spontaneous Verbalization: Conversation ?Repetition: No impairment   ?Oral / Motor ? Oral Motor/Sensory Function ?Overall Oral Motor/Sensory Function: Mild impairment ?Facial ROM: Within Functional Limits ?Facial Symmetry: Within Functional Limits ?Facial Strength: Within Functional Limits ?Lingual ROM: Reduced left;Within Functional Limits ?Lingual Strength: Reduced;Suspected CN XII (hypoglossal) dysfunction ?Motor Speech ?Overall Motor Speech: Impaired at baseline ?Respiration: Within functional limits ?Phonation: Hoarse;Low vocal intensity ?Resonance: Within functional limits ?Articulation: Impaired ?Level of Impairment: Conversation ?Intelligibility: Intelligibility reduced ?Word: 75-100% accurate ?Phrase: 75-100% accurate ?Sentence: 75-100% accurate ?Conversation: 75-100% accurate ?Motor Planning: Witnin functional limits ?Motor Speech Errors: Aware;Consistent   ?Koda Defrank I. Hardin Negus, [place], CCC-SLP ?Acute Rehabilitation Services ?Office number [phone] ?Pager [phone] ?        ? ?[name] ?03/24/2022, 10:28 AM ? ? ? ?

## 2022-03-24 NOTE — Consult Note (Signed)
NEUROLOGY CONSULTATION NOTE  ? ?Date of service: Mar 24, 2022 ?Patient Name: [name] ?MRN:  [id number] ?DOB:  04-05-1951 ?Reason for consult: "medullary stroke on MRI" ?Requesting Provider: Jonnie Finner, DO ?_ _ _   _ __   _ __ _ _  __ __   _ __   __ _ ? ?History of Present Illness  ?[name] is a 71 y.o. female with PMH significant for CKD3, peripheral vascular disease, HTN, hypothyroidism, DM2, prior right corona radiata infarct on 02/16/2018 who presents with vertigo, feeling off balance and hoarse voice. Symptoms started early morning on 03/19/22. 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 ? ?Reports choking on her food. Endorses hx of prior strokes, Diabetes that is having some trouble controlling, also difficulty ontrolling hypertension. No hx of hyperlipidemia per patient. Does not smoke, no hx of recreational drug use and does not drink alcohol. ? ?LKW: 03/19/22 in AM ?mRS: 0 ?tNKASE: not offered, outside window ?Thrombectomy: not offered, outside window and low concern for an LVO. ?NIHSS components Score: Comment  ?1a Level of Conscious 0'[x]'$  1'[]'$  2'[]'$  3'[]'$      ?1b LOC Questions 0'[x]'$  1'[]'$  2'[]'$       ?1c LOC Commands 0'[x]'$  1'[]'$  2'[]'$       ?2 Best Gaze 0'[x]'$  1'[]'$  2'[]'$       ?3 Visual 0'[x]'$  1'[]'$  2'[]'$  3'[]'$      ?4 Facial Palsy 0'[x]'$  1'[]'$  2'[]'$  3'[]'$      ?5a Motor Arm - left 0'[x]'$  1'[]'$  2'[]'$  3'[]'$  4'[]'$  UN'[]'$    ?5b Motor Arm - Right 0'[x]'$  1'[]'$  2'[]'$  3'[]'$  4'[]'$  UN'[]'$    ?6a Motor Leg - Left 0'[x]'$  1'[]'$  2'[]'$  3'[]'$  4'[]'$  UN'[]'$    ?6b Motor Leg - Right 0'[x]'$  1'[]'$  2'[]'$  3'[]'$  4'[]'$  UN'[]'$    ?7 Limb Ataxia 0'[x]'$  1'[]'$  2'[]'$  3'[]'$  UN'[]'$     ?8 Sensory 0'[x]'$  1'[]'$  2'[]'$  UN'[]'$      ?9 Best Language 0'[x]'$  1'[]'$  2'[]'$  3'[]'$      ?10 Dysarthria 0'[x]'$  1'[]'$  2'[]'$  UN'[]'$      ?11 Extinct. and Inattention 0'[x]'$  1'[]'$  2'[]'$       ?TOTAL: 0   ? ? ?  ?ROS  ? ?Constitutional Denies weight loss, fever and chills.   ?HEENT Denies changes in vision and hearing.   ?Respiratory Denies SOB and cough.   ?CV Denies palpitations and CP   ?GI Denies abdominal pain, nausea, vomiting and diarrhea.   ?GU Denies dysuria and  urinary frequency.   ?MSK Denies myalgia and joint pain.   ?Skin Denies rash and pruritus.   ?Neurological Denies headache and syncope.   ?Psychiatric Denies recent changes in mood. Denies anxiety and depression.   ? ?Past History  ? ?Past Medical History:  ?Diagnosis Date  ? Anemia   ? Arteriosclerotic cardiovascular disease   ? CKD (chronic kidney disease) stage 3, GFR 30-59 ml/min (HCC)   ? Claudication in peripheral vascular disease ([place])   ? CVA (cerebral vascular accident) [facility])   ? 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 GERD (gastroesophageal reflux disease)   ? Hyperlipidemia   ? declines statins  ? Hypertension   ? Hypothyroidism   ? Type 2 diabetes mellitus ([place]) 2000  ? Vitamin D deficiency   ? ?Past Surgical History:  ?Procedure Laterality Date  ? CESAREAN SECTION    ? COLONOSCOPY  09/21/2020  ? LEFT HEART CATHETERIZATION WITH CORONARY ANGIOGRAM N/A 03/08/2012  ? Procedure: LEFT HEART CATHETERIZATION WITH CORONARY ANGIOGRAM;  Surgeon: Mallie Mussel  Carlye Grippe, MD;  Location: [facility] CATH LAB;  Service: Cardiovascular;  Laterality: N/A;  ? LEFT HEART CATHETERIZATION WITH CORONARY ANGIOGRAM N/A 12/17/2012  ? Procedure: LEFT HEART CATHETERIZATION WITH CORONARY ANGIOGRAM;  Surgeon: Laverda Page, MD;  Location: [facility] CATH LAB;  Service: Cardiovascular;  Laterality: N/A;  ? LOOP RECORDER INSERTION N/A 03/05/2018  ? Procedure: LOOP RECORDER INSERTION;  Surgeon: Evans Lance, MD;  Location: [place] CV LAB;  Service: Cardiovascular;  Laterality: N/A;  ? LOWER EXTREMITY ANGIOGRAM N/A 12/17/2012  ? Procedure: LOWER EXTREMITY ANGIOGRAM;  Surgeon: Laverda Page, MD;  Location: [facility] CATH LAB;  Service: Cardiovascular;  Laterality: N/A;  ? TEE WITHOUT CARDIOVERSION N/A 02/28/2018  ? Procedure: TRANSESOPHAGEAL ECHOCARDIOGRAM (TEE);  Surgeon: Jerline Pain, MD;  Location: [facility] ENDOSCOPY;  Service: Cardiovascular;  Laterality: N/A;  ? TUBAL LIGATION    ? ?Family History  ?Problem  Relation Age of Onset  ? Diabetes Brother   ? Prostate cancer Brother   ? Diabetes Mother   ? Diabetes Father   ? Diabetes Sister   ? Esophageal cancer Neg Hx   ? Rectal cancer Neg Hx   ? Stomach cancer Neg Hx   ? Colon cancer Neg Hx   ? Breast cancer Neg Hx   ? ?Social History  ? ?Socioeconomic History  ? Marital status: Single  ?  Spouse name: Not on file  ? Number of children: 1  ? Years of education: 10th- GED  ? Highest education level: Not on file  ?Occupational History  ? Occupation: Retired  ?Tobacco Use  ? Smoking status: Former  ?  Types: Cigarettes  ?  Quit date: 02/04/2011  ?  Years since quitting: 11.1  ? Smokeless tobacco: Never  ?Vaping Use  ? Vaping Use: Never used  ?Substance and Sexual Activity  ? Alcohol use: No  ?  Alcohol/week: 0.0 standard drinks  ? Drug use: No  ? Sexual activity: Not on file  ?Other Topics Concern  ? Not on file  ?Social History Narrative  ? Not on file  ? ?Social Determinants of Health  ? ?Financial Resource Strain: Not on file  ?Food Insecurity: Not on file  ?Transportation Needs: Not on file  ?Physical Activity: Not on file  ?Stress: Not on file  ?Social Connections: Not on file  ? ?Allergies  ?Allergen Reactions  ? Dilantin [Phenytoin Sodium Extended]   ?  Rash/itching  ? Insulins Rash  ?  Humalog 75/25 only  ? Statins Nausea Only and Rash  ?  "sick to my stomach"; describes "rash and a lump" while taking statins  ? ? ?Medications  ? ?Facility-Administered Medications Prior to Admission  ?Medication Dose Route Frequency Provider Last Rate Last Admin  ? 0.9 %  sodium chloride infusion  500 mL Intravenous Once Irene Shipper, MD      ? ?Medications Prior to Admission  ?Medication Sig Dispense Refill Last Dose  ? amLODipine (NORVASC) 10 MG tablet Take 10 mg by mouth daily.   03/23/2022  ? cilostazol (PLETAL) 100 MG tablet TAKE 1 TABLET BY MOUTH TWICE A DAY (Patient taking differently: Take 100 mg by mouth 2 (two) times daily.) 180 tablet 1 03/23/2022  ? clopidogrel (PLAVIX) 75  MG tablet TAKE 1 TABLET BY MOUTH EVERY DAY IN THE MORNING (Patient taking differently: Take 75 mg by mouth daily.) 90 tablet 3 12 noon  ? gemfibrozil (LOPID) 600 MG tablet TAKE 1 TABLET BY MOUTH TWICE A DAY (Patient taking differently: Take  600 mg by mouth daily.) 180 tablet 3 03/23/2022  ? LANTUS SOLOSTAR 100 UNIT/ML Solostar Pen 26 Units 2 (two) times daily.   03/22/2022  ? levothyroxine (SYNTHROID, LEVOTHROID) 88 MCG tablet TAKE 1 TABLET BY MOUTH EVERY DAY BEFORE BREAKFAST (Patient taking differently: Take 88 mcg by mouth daily before breakfast.) 90 tablet 3 03/23/2022  ? losartan-hydrochlorothiazide (HYZAAR) 100-25 MG tablet Take 1 tablet by mouth daily. 90 tablet 1 03/23/2022  ? BD INSULIN SYRINGE ULTRAFINE 31G X 15/64" 0.5 ML MISC USE TO INJECT INSULIN TWO TIMES A DAY 100 each 11   ? D3-1000 25 MCG (1000 UT) capsule TAKE 1 CAPSULE (1,000 UNITS TOTAL) BY MOUTH DAILY. (Patient not taking: Reported on 03/23/2022) 180 capsule 1 Not Taking  ? famotidine (PEPCID) 20 MG tablet TAKE 1 TABLET BY MOUTH DAILY AS NEEDED FOR HEARTBURN OR INDIGESTION. (Patient not taking: Reported on 03/23/2022) 90 tablet 1 Not Taking  ? glucose blood (ACCU-CHEK AVIVA PLUS) test strip 1 each by Other route 2 (two) times daily. Use as instructed 200 each 1   ? Insulin Pen Needle (B-D UF III MINI PEN NEEDLES) 31G X 5 MM MISC INJECTS INSULIN 2 TIMES PER DAY. 200 each 3   ? Lancets (ACCU-CHEK SOFT TOUCH) lancets Use as instructed 100 each 12   ?  ? ?Vitals  ? ?Vitals:  ? 03/23/22 2200 03/23/22 2245 03/23/22 2308 03/24/22 0000  ?BP: (!) 179/61 (!) 186/82  (!) 174/73  ?Pulse: 79 89 86 77  ?Resp: 12 (!) '24 20 17  '$ ?Temp:    97.7 ?F (36.5 ?C)  ?TempSrc:    Oral  ?SpO2: 94% 97% 97% 94%  ?Weight:      ?Height:      ?  ? ?Body mass index is 29.26 kg/m?. ? ?Physical Exam  ? ?General: Laying comfortably in bed; in no acute distress.  ?HENT: Normal oropharynx and mucosa. Normal external appearance of ears and nose.  ?Neck: Supple, no pain or tenderness  ?CV: No  JVD. No peripheral edema.  ?Pulmonary: Symmetric Chest rise. Normal respiratory effort.  ?Abdomen: Soft to touch, non-tender.  ?Ext: No cyanosis, edema, or deformity  ?Skin: No rash. Normal palpation of skin.   ?M

## 2022-03-24 NOTE — Progress Notes (Signed)
Initial Nutrition Assessment ? ?DOCUMENTATION CODES:  ? ?Not applicable ? ?INTERVENTION:  ? ?Tube Feeds via Cortrak: ?Start Jevity 1.5 @ 50 mL/hr (1200 mL/day) ?45 mL ProSource TF - BID ?150 mL free water flushes q4h ?Provides 1880 kcal, 99 gm of protein, 912 mL free water (1812 mL total free water) daily.  ? ?NUTRITION DIAGNOSIS:  ? ?Inadequate oral intake related to inability to eat as evidenced by NPO status. ? ?GOAL:  ? ?Patient will meet greater than or equal to 90% of their needs ? ?MONITOR:  ? ?Diet advancement, Labs, Weight trends, TF tolerance ? ?REASON FOR ASSESSMENT:  ? ?Consult ?Enteral/tube feeding initiation and management ? ?ASSESSMENT:  ? ?71 y.o. female presented to the ED with weakness and lightheadedness. PMH includes CVA, T2DM, HTN, CKD IIIb, and GERD. Pt admitted with CVA.  ? ?5/12 - MBS w/ SLP, recommend NPO; Cortrak placed (tip  ? ?Pt reports that her appetite was great at home PTA. Listed a variety of foods of to RD that pt eats at home. Reports that she tries to eat at least 2 meals per day and does not snack at home.  ? ?Pt denies any recent weight loss. No weights within the EMR within the past year to assess weight loss. Pt reports no assistance with ambulating at home.  ? ?Discussed Cortrak placement with pt, pt expressed understanding of reason for needing placement.  ? ?Medications reviewed and include: SSI 0-20 units TID + 0-5 units daily, Semglee ?Labs reviewed: 24 hr CBG [id number] ?  ?NUTRITION - FOCUSED PHYSICAL EXAM: ? ?Flowsheet Row Most Recent Value  ?Orbital Region No depletion  ?Upper Arm Region No depletion  ?Thoracic and Lumbar Region No depletion  ?Buccal Region No depletion  ?Temple Region No depletion  ?Clavicle Bone Region Mild depletion  ?Clavicle and Acromion Bone Region Mild depletion  ?Scapular Bone Region Mild depletion  ?Dorsal Hand No depletion  ?Patellar Region Mild depletion  ?Anterior Thigh Region Mild depletion  ?Posterior Calf Region Mild depletion  ?Edema (RD  Assessment) None  ?Hair Reviewed  ?Eyes Reviewed  ?Mouth Unable to assess  ?Skin Reviewed  ?Nails Reviewed  ? ?Diet Order:   ?Diet Order   ? ?       ?  Diet NPO time specified Except for: Ice Chips  Diet effective now       ?  ? ?  ?  ? ?  ? ?EDUCATION NEEDS:  ? ?No education needs have been identified at this time ? ?Skin:  Skin Assessment: Reviewed RN Assessment ? ?Last BM:  5/10 ? ?Height:  ?Ht Readings from Last 1 Encounters:  ?03/23/22 '5\' 2"'$  (1.575 m)  ? ?Weight:  ?Wt Readings from Last 1 Encounters:  ?03/23/22 72.6 kg  ? ?Ideal Body Weight:  50 kg ? ?BMI:  Body mass index is 29.26 kg/m?. ? ?Estimated Nutritional Needs:  ?Kcal:  1800-2000 ?Protein:  90-105 grams ?Fluid:  >/= 1.8 L ? ?Hermina Barters RD, LDN ?Clinical Dietitian ?See AMiON for contact information.  ?

## 2022-03-24 NOTE — Progress Notes (Signed)
Inpatient Diabetes Program Recommendations ? ?AACE/ADA: New Consensus Statement on Inpatient Glycemic Control (2015) ? ?Target Ranges:  Prepandial:   less than 140 mg/dL ?     Peak postprandial:   less than 180 mg/dL (1-2 hours) ?     Critically ill patients:  140 - 180 mg/dL  ? ? Latest Reference Range & Units 03/23/22 07:49 03/23/22 20:48 03/24/22 07:59  ?Glucose-Capillary 70 - 99 mg/dL 354 (H) 353 (H) ? ?5 units Novolog ? 393 (H)  ?(H): Data is abnormally high ? Latest Reference Range & Units 03/24/22 02:14  ?Hemoglobin A1C 4.8 - 5.6 % 9.5 (H) ? ?(225 mg/dl)  ?(H): Data is abnormally high ? ? ?Admit with: CVA ? ?History: DM2, CVA, CKD ? ?Home DM Meds: Lantus 26 units BID ? ?Current Orders: Novolog Resistant Correction Scale/ SSI (0-20 units) TID AC + HS ? ? ? ? ?MD- Note CBG 393 this AM ? ?Pt takes Lantus insulin at home ? ?Please consider starting Semglee 20 units BID (75% total home dose) ? ? ? ?--Will follow patient during hospitalization-- ? ?Wyn Quaker RN, MSN, CDE ?Diabetes Coordinator ?Inpatient Glycemic Control Team ?Team Pager: [phone] (8a-5p) ? ? ?

## 2022-03-24 NOTE — Evaluation (Signed)
Occupational Therapy Evaluation ?Patient Details ?Name: [name] ?MRN: [id number] ?DOB: December 19, 1950 ?Today's Date: 03/24/2022 ? ? ?History of Present Illness Pt is a 71 year old woman admitted on 03/23/22 with dizziness, decreased balance and hoarse voice. + stroke L medulla. PMH: CVA, DM2, CKDIII, PVD, HTN, hypothyroidism.  ? ?Clinical Impression ?  ?Pt was independent prior to admission and drives. She endorses a sedentary lifestyle and likes to read and watch TV. Pt presents with dizziness with sitting, impaired standing balance requiring min (hand held assist) for transfer to and from [facility]. She needs set up to min assist for ADLs. Appears to have some cognitive deficits, unclear if baseline, will further assess.  ?   ? ?Recommendations for follow up therapy are one component of a multi-disciplinary discharge planning process, led by the attending physician.  Recommendations may be updated based on patient status, additional functional criteria and insurance authorization.  ? ?Follow Up Recommendations ? Home health OT  ?  ?Assistance Recommended at Discharge Intermittent Supervision/Assistance  ?Patient can return home with the following A little help with walking and/or transfers;A little help with bathing/dressing/bathroom;Assistance with cooking/housework;Assist for transportation;Help with stairs or ramp for entrance ? ?  ?Functional Status Assessment ? Patient has had a recent decline in their functional status and demonstrates the ability to make significant improvements in function in a reasonable and predictable amount of time.  ?Equipment Recommendations ?  (ongoing assessment for tub equipment)  ?  ?Recommendations for Other Services   ? ? ?  ?Precautions / Restrictions Precautions ?Precautions: Fall ?Precaution Comments: cortrak ?Restrictions ?Weight Bearing Restrictions: No  ? ?  ? ?Mobility Bed Mobility ?Overal bed mobility: Needs Assistance ?Bed Mobility: Supine to Sit, Sit to Supine ?  ?   ?Supine to sit: Supervision ?Sit to supine: Supervision ?  ?General bed mobility comments: for lines, safety ?  ? ?Transfers ?Overall transfer level: Needs assistance ?Equipment used: 1 person hand held assist ?Transfers: Sit to/from Stand, Bed to chair/wheelchair/BSC ?Sit to Stand: Min assist ?Stand pivot transfers: Min assist ?  ?  ?  ?  ?General transfer comment: steadying assist ?  ? ?  ?Balance Overall balance assessment: Needs assistance ?  ?Sitting balance-Leahy Scale: Good ?Sitting balance - Comments: no LOB donning socks ?  ?  ?Standing balance-Leahy Scale: Poor ?Standing balance comment: requires min hand held assist ?  ?  ?  ?  ?  ?  ?  ?  ?  ?  ?  ?   ? ?ADL either performed or assessed with clinical judgement  ? ?ADL Overall ADL's : Needs assistance/impaired ?Eating/Feeding: NPO ?  ?Grooming: Set up;Sitting;Oral care ?  ?Upper Body Bathing: Set up;Sitting ?  ?Lower Body Bathing: Minimal assistance;Sit to/from stand ?  ?Upper Body Dressing : Set up;Sitting ?  ?Lower Body Dressing: Minimal assistance;Sit to/from stand ?Lower Body Dressing Details (indicate cue type and reason): can cross foot over opposite knee to don socks ?Toilet Transfer: Minimal assistance;Stand-pivot;BSC/3in1 ?  ?Toileting- Clothing Manipulation and Hygiene: Minimal assistance;Sit to/from stand ?  ?  ?  ?  ?   ? ? ? ?Vision Ability to See in Adequate Light: 0 Adequate ?   ?   ?Perception   ?  ?Praxis   ?  ? ?Pertinent Vitals/Pain    ? ? ? ?Hand Dominance Right ?  ?Extremity/Trunk Assessment Upper Extremity Assessment ?Upper Extremity Assessment: Overall WFL for tasks assessed ?  ?Lower Extremity Assessment ?Lower Extremity Assessment: Defer to PT evaluation ?  ?  Cervical / Trunk Assessment ?Cervical / Trunk Assessment: Normal ?  ?Communication Communication ?Communication: Other (comment) (hoarse) ?  ?Cognition Arousal/Alertness: Awake/alert ?Behavior During Therapy: [facility] for tasks assessed/performed ?Overall Cognitive Status:  Impaired/Different from baseline ?Area of Impairment: Problem solving, Following commands ?  ?  ?  ?  ?  ?  ?  ?  ?  ?  ?  ?Following Commands: Follows multi-step commands inconsistently, Follows one step commands consistently ?  ?  ?Problem Solving: Slow processing, Decreased initiation ?General Comments: needs further assessment, pt is oriented and a good historian of home set up and PLOF, some confusion about hospital course ?  ?  ?General Comments    ? ?  ?Exercises   ?  ?Shoulder Instructions    ? ? ?Home Living Family/patient expects to be discharged to:: Private residence ?Living Arrangements: Alone ?Available Help at Discharge: Family;Available PRN/intermittently ?Type of Home: House ?Home Access: Stairs to enter ?Entrance Stairs-Number of Steps: 3+5 ?Entrance Stairs-Rails: Right;Left;Can reach both (only on the 5 steps) ?Home Layout: One level ?  ?  ?Bathroom Shower/Tub: Tub/shower unit ?  ?Bathroom Toilet: Handicapped height ?  ?  ?Home Equipment: Conservation officer, nature (2 wheels);Cane - single point ?  ?  ? Lives With: Alone ? ?  ?Prior Functioning/Environment Prior Level of Function : Independent/Modified Independent;Driving ?  ?  ?  ?  ?  ?  ?  ?  ?  ? ?  ?  ?OT Problem List: Impaired balance (sitting and/or standing);Decreased knowledge of use of DME or AE ?  ?   ?OT Treatment/Interventions: Self-care/ADL training;DME and/or AE instruction;Patient/family education;Balance training;Therapeutic activities  ?  ?OT Goals(Current goals can be found in the care plan section) Acute Rehab OT Goals ?OT Goal Formulation: With patient ?Time For Goal Achievement: 04/07/22 ?Potential to Achieve Goals: Good ?ADL Goals ?Pt Will Perform Grooming: with supervision;standing ?Pt Will Perform Lower Body Bathing: with supervision;sit to/from stand ?Pt Will Perform Lower Body Dressing: with supervision;sit to/from stand ?Pt Will Transfer to Toilet: with supervision;ambulating;regular height toilet ?Pt Will Perform Toileting -  Clothing Manipulation and hygiene: with supervision;sit to/from stand ?Additional ADL Goal #1: Pt will complete pill box test.  ?OT Frequency: Min 3X/week ?  ? ?Co-evaluation   ?  ?  ?  ?  ? ?  ?AM-PAC OT "6 Clicks" Daily Activity     ?Outcome Measure Help from another person eating meals?: Total ?Help from another person taking care of personal grooming?: A Little ?Help from another person toileting, which includes using toliet, bedpan, or urinal?: A Little ?Help from another person bathing (including washing, rinsing, drying)?: A Little ?Help from another person to put on and taking off regular upper body clothing?: None ?Help from another person to put on and taking off regular lower body clothing?: A Little ?6 Click Score: 17 ?  ?End of Session Equipment Utilized During Treatment: Gait belt ? ?Activity Tolerance: Patient tolerated treatment well ?Patient left: in bed;with call bell/phone within reach;with bed alarm set ? ?OT Visit Diagnosis: Unsteadiness on feet (R26.81);Dizziness and giddiness (R42)  ?              ?Time: 1447-1510 ?OT Time Calculation (min): 23 min ?Charges:  OT General Charges ?$OT Visit: 1 Visit ?OT Evaluation ?$OT Eval Moderate Complexity: 1 Mod ?OT Treatments ?$Self Care/Home Management : 8-22 mins ? ?Nestor Lewandowsky, OTR/L ?Acute Rehabilitation Services ?Pager: [phone] ?Office: [phone]  ? ?Malka So ?03/24/2022, 3:27 PM ?

## 2022-03-25 ENCOUNTER — Inpatient Hospital Stay (HOSPITAL_COMMUNITY): Payer: Medicare Other

## 2022-03-25 DIAGNOSIS — E1169 Type 2 diabetes mellitus with other specified complication: Secondary | ICD-10-CM | POA: Diagnosis not present

## 2022-03-25 DIAGNOSIS — I10 Essential (primary) hypertension: Secondary | ICD-10-CM | POA: Diagnosis not present

## 2022-03-25 DIAGNOSIS — I639 Cerebral infarction, unspecified: Secondary | ICD-10-CM | POA: Diagnosis not present

## 2022-03-25 DIAGNOSIS — I63212 Cerebral infarction due to unspecified occlusion or stenosis of left vertebral arteries: Secondary | ICD-10-CM | POA: Diagnosis not present

## 2022-03-25 DIAGNOSIS — E1159 Type 2 diabetes mellitus with other circulatory complications: Secondary | ICD-10-CM | POA: Diagnosis not present

## 2022-03-25 DIAGNOSIS — N1832 Chronic kidney disease, stage 3b: Secondary | ICD-10-CM | POA: Diagnosis not present

## 2022-03-25 DIAGNOSIS — E038 Other specified hypothyroidism: Secondary | ICD-10-CM | POA: Diagnosis not present

## 2022-03-25 DIAGNOSIS — E782 Mixed hyperlipidemia: Secondary | ICD-10-CM | POA: Diagnosis not present

## 2022-03-25 LAB — CBC
HCT: 34 % — ABNORMAL LOW (ref 36.0–46.0)
HCT: 36 % (ref 36.0–46.0)
Hemoglobin: 11.6 g/dL — ABNORMAL LOW (ref 12.0–15.0)
Hemoglobin: 11.8 g/dL — ABNORMAL LOW (ref 12.0–15.0)
MCH: 28.5 pg (ref 26.0–34.0)
MCH: 29.2 pg (ref 26.0–34.0)
MCHC: 32.8 g/dL (ref 30.0–36.0)
MCHC: 34.1 g/dL (ref 30.0–36.0)
MCV: 85.6 fL (ref 80.0–100.0)
MCV: 87 fL (ref 80.0–100.0)
Platelets: 307 10*3/uL (ref 150–400)
Platelets: 327 10*3/uL (ref 150–400)
RBC: 3.97 MIL/uL (ref 3.87–5.11)
RBC: 4.14 MIL/uL (ref 3.87–5.11)
RDW: 13.7 % (ref 11.5–15.5)
RDW: 13.7 % (ref 11.5–15.5)
WBC: 6.4 10*3/uL (ref 4.0–10.5)
WBC: 6.8 10*3/uL (ref 4.0–10.5)
nRBC: 0 % (ref 0.0–0.2)
nRBC: 0 % (ref 0.0–0.2)

## 2022-03-25 LAB — MAGNESIUM: Magnesium: 2.1 mg/dL (ref 1.7–2.4)

## 2022-03-25 LAB — BASIC METABOLIC PANEL
Anion gap: 11 (ref 5–15)
Anion gap: 11 (ref 5–15)
BUN: 33 mg/dL — ABNORMAL HIGH (ref 8–23)
BUN: 36 mg/dL — ABNORMAL HIGH (ref 8–23)
CO2: 22 mmol/L (ref 22–32)
CO2: 23 mmol/L (ref 22–32)
Calcium: 10.1 mg/dL (ref 8.9–10.3)
Calcium: 10.1 mg/dL (ref 8.9–10.3)
Chloride: 100 mmol/L (ref 98–111)
Chloride: 99 mmol/L (ref 98–111)
Creatinine, Ser: 1.38 mg/dL — ABNORMAL HIGH (ref 0.44–1.00)
Creatinine, Ser: 1.46 mg/dL — ABNORMAL HIGH (ref 0.44–1.00)
GFR, Estimated: 38 mL/min — ABNORMAL LOW (ref >60)
GFR, Estimated: 41 mL/min — ABNORMAL LOW (ref >60)
Glucose, Bld: 266 mg/dL — ABNORMAL HIGH (ref 70–99)
Glucose, Bld: 314 mg/dL — ABNORMAL HIGH (ref 70–99)
Potassium: 3.6 mmol/L (ref 3.5–5.1)
Potassium: 3.7 mmol/L (ref 3.5–5.1)
Sodium: 132 mmol/L — ABNORMAL LOW (ref 135–145)
Sodium: 134 mmol/L — ABNORMAL LOW (ref 135–145)

## 2022-03-25 LAB — T4, FREE: Free T4: 0.68 ng/dL (ref 0.61–1.12)

## 2022-03-25 LAB — APTT: aPTT: 45 seconds — ABNORMAL HIGH (ref 24–36)

## 2022-03-25 LAB — GLUCOSE, CAPILLARY
Glucose-Capillary: 313 mg/dL — ABNORMAL HIGH (ref 70–99)
Glucose-Capillary: 298 mg/dL — ABNORMAL HIGH (ref 70–99)
Glucose-Capillary: 294 mg/dL — ABNORMAL HIGH (ref 70–99)
Glucose-Capillary: 335 mg/dL — ABNORMAL HIGH (ref 70–99)
Glucose-Capillary: 238 mg/dL — ABNORMAL HIGH (ref 70–99)
Glucose-Capillary: 186 mg/dL — ABNORMAL HIGH (ref 70–99)

## 2022-03-25 LAB — PROTIME-INR
INR: 1.1 (ref 0.8–1.2)
Prothrombin Time: 13.6 seconds (ref 11.4–15.2)

## 2022-03-25 LAB — TSH: TSH: 1.343 u[IU]/mL (ref 0.350–4.500)

## 2022-03-25 MED ORDER — LEVOTHYROXINE SODIUM 100 MCG/5ML IV SOLN
44.0000 ug | Freq: Every day | INTRAVENOUS | Status: DC
  Administered 2022-03-26 – 2022-03-27 (×2): 44 ug via INTRAVENOUS
  Filled 2022-03-25 (×2): qty 5

## 2022-03-25 MED ORDER — SODIUM CHLORIDE 0.9 % IV SOLN
3.0000 g | Freq: Three times a day (TID) | INTRAVENOUS | Status: DC
  Administered 2022-03-25 – 2022-03-26 (×3): 3 g via INTRAVENOUS
  Filled 2022-03-25 (×3): qty 8

## 2022-03-25 MED ORDER — ORAL CARE MOUTH RINSE
15.0000 mL | Freq: Two times a day (BID) | OROMUCOSAL | Status: DC
  Administered 2022-03-26 – 2022-03-27 (×4): 15 mL via OROMUCOSAL

## 2022-03-25 MED ORDER — AMLODIPINE BESYLATE 10 MG PO TABS: 10.0000 mg | ORAL_TABLET | Freq: Every day | ORAL | Status: DC

## 2022-03-25 MED ORDER — SCOPOLAMINE 1 MG/3DAYS TD PT72
1.0000 | MEDICATED_PATCH | TRANSDERMAL | Status: DC
  Administered 2022-03-25: 1.5 mg via TRANSDERMAL
  Filled 2022-03-25: qty 1

## 2022-03-25 MED ORDER — INSULIN ASPART 100 UNIT/ML IJ SOLN
0.0000 [IU] | INTRAMUSCULAR | Status: DC
  Administered 2022-03-25: 7 [IU] via SUBCUTANEOUS
  Administered 2022-03-25: 2 [IU] via SUBCUTANEOUS
  Administered 2022-03-25: 3 [IU] via SUBCUTANEOUS
  Administered 2022-03-25: 5 [IU] via SUBCUTANEOUS
  Administered 2022-03-26: 2 [IU] via SUBCUTANEOUS
  Administered 2022-03-26 (×2): 3 [IU] via SUBCUTANEOUS
  Administered 2022-03-26: 5 [IU] via SUBCUTANEOUS
  Administered 2022-03-26: 7 [IU] via SUBCUTANEOUS
  Administered 2022-03-26: 2 [IU] via SUBCUTANEOUS
  Administered 2022-03-27: 1 [IU] via SUBCUTANEOUS
  Administered 2022-03-27 (×4): 2 [IU] via SUBCUTANEOUS
  Administered 2022-03-28: 1 [IU] via SUBCUTANEOUS
  Administered 2022-03-28: 2 [IU] via SUBCUTANEOUS
  Administered 2022-03-28: 3 [IU] via SUBCUTANEOUS
  Administered 2022-03-28: 1 [IU] via SUBCUTANEOUS
  Administered 2022-03-28: 7 [IU] via SUBCUTANEOUS
  Administered 2022-03-29: 3 [IU] via SUBCUTANEOUS
  Administered 2022-03-29: 9 [IU] via SUBCUTANEOUS

## 2022-03-25 MED ORDER — OXYMETAZOLINE HCL 0.05 % NA SOLN
3.0000 | Freq: Two times a day (BID) | NASAL | Status: AC | PRN
  Filled 2022-03-25: qty 30

## 2022-03-25 MED ORDER — ASPIRIN 300 MG RE SUPP
300.0000 mg | Freq: Every day | RECTAL | Status: DC
  Administered 2022-03-26: 300 mg via RECTAL
  Filled 2022-03-25: qty 1

## 2022-03-25 MED ORDER — GLYCOPYRROLATE 0.2 MG/ML IJ SOLN
0.1000 mg | Freq: Three times a day (TID) | INTRAMUSCULAR | Status: DC
  Administered 2022-03-25 (×3): 0.1 mg via INTRAVENOUS
  Filled 2022-03-25 (×3): qty 1

## 2022-03-25 MED ORDER — CHLORHEXIDINE GLUCONATE CLOTH 2 % EX PADS
6.0000 | MEDICATED_PAD | Freq: Every day | CUTANEOUS | Status: DC
  Administered 2022-03-25 – 2022-04-18 (×22): 6 via TOPICAL

## 2022-03-25 MED ORDER — ASPIRIN 300 MG RE SUPP: 300.0000 mg | Freq: Every day | RECTAL | Status: DC

## 2022-03-25 MED ORDER — DEXTROSE-NACL 5-0.9 % IV SOLN: INTRAVENOUS | Status: DC

## 2022-03-25 MED ORDER — CHLORHEXIDINE GLUCONATE 0.12 % MT SOLN
15.0000 mL | Freq: Two times a day (BID) | OROMUCOSAL | Status: DC
  Administered 2022-03-25 – 2022-04-18 (×42): 15 mL via OROMUCOSAL
  Filled 2022-03-25 (×33): qty 15

## 2022-03-25 NOTE — Progress Notes (Signed)
?      ?                 PROGRESS NOTE ? ?      ?PATIENT DETAILS ?Name: [name] ?Age: 71 y.o. ?Sex: female ?Date of Birth: 1951/07/13 ?Admit Date: 03/23/2022 ?Admitting Physician Jonnie Finner, DO ?[id number], Jimmy Picket, MD (Inactive) ? ?Brief Summary: ?Patient is a 71 y.o.  female with prior history of CVA, DM-2, HTN, HLD, CKD stage IIIb-who presented with vertigo/dysphagia-further work-up revealed acute CVA involving left Medulla ? ?Significant events: ?5/11>> presented to the ED with vertigo-CVA involving left Medulla ?5/12>> bleeding from cortrak tube site-aspirin/Brilinta/SQ Lovenox discontinued.  Tube removed. ? ?Significant studies: ?5/11>> MRI brain: Acute infarct-left Medulla ?5/12>> A1c:9.5 ?5/12>> LDL: 142 ?5/12>> Loop recorder and interrogation: No A-fib ?5/13>> MRA head: High-grade narrowing of nondominant left V4 segment. ?5/13>> MRA neck: No major stenosis. ?5/13>> repeat MRI brain: No extension of acute left medullary infarct. ? ?Significant microbiology data: ? ? ?Procedures: ?None ? ?Consults: ?Neurology ? ?Subjective: ?Continues to complain of difficulty swallowing-suctioning saliva.  Complains of hoarseness of voice/discomfort in her upper throat.  Double vision today. ? ?Objective: ?Vitals: ?Blood pressure 126/61, pulse 98, temperature 98 ?F (36.7 ?C), temperature source Oral, resp. rate (!) 23, height '5\' 2"'$  (1.575 m), weight 74.9 kg, SpO2 91 %.  ? ?Exam: ?Gen Exam:Alert awake-not in any distress ?HEENT:atraumatic, normocephalic ?Chest: B/L clear to auscultation anteriorly ?CVS:S1S2 regular ?Abdomen:soft non tender, non distended ?Extremities:no edema ?Neurology: Moving all 4 extremities. ?Skin: no rash  ? ?Pertinent Labs/Radiology: ? ?  Latest Ref Rng & Units 03/25/2022  ?  1:57 AM 03/24/2022  ? 11:41 PM 03/23/2022  ?  7:38 AM  ?CBC  ?WBC 4.0 - 10.5 K/uL 6.8   6.4   6.3    ?Hemoglobin 12.0 - 15.0 g/dL 11.8   11.6   11.7    ?Hematocrit 36.0 - 46.0 % 36.0   34.0   36.2    ?Platelets  150 - 400 K/uL 327   307   314    ?  ?Lab Results  ?Component Value Date  ? NA 134 (L) 03/25/2022  ? K 3.7 03/25/2022  ? CL 100 03/25/2022  ? CO2 23 03/25/2022  ? ?  ? ? ?Assessment/Plan: ?Acute left medullary infarct: Continues to have vertigo/dysphagia-double vision today.  Work-up as above-unfortunately NG tube removed overnight due to epistaxis-and hence dual antiplatelet/statin held.  We will gently hydrate today-strict aspiration precautions-if dysphagia does not improve-she will likely require PEG tube insertion.  ? ?Dysphagia: Due to CVA- cortrak tube inserted on 5/12-but developed bleeding-subsequently removed.  At risk for aspiration-maintain strict aspiration precautions-frequent suctioning-she is currently able to protect her airway.  Have also started on scopolamine patch and Robinul to help with secretions.  CXR with no PNA this morning.  She is currently appears to be stable and protecting her airway-if this changes-she will require transfer to the ICU. ? ?Epistaxis: Likely due to girdle/cortrak tube-and use of dual antiplatelets.  Resolved.  See above ? ?HTN: Allow permissive hypertension.  Resume antihypertensives over the next few days. ? ?HLD: No statin/gemfibrozil as n.p.o. due to dysphagia. ? ?DM-2: CBGs relatively stable-given n.p.o. status-at risk for hypoglycemia-continue Semglee 12 units-change SSI to every 4 scale.  Watch closely and adjust accordingly. ? ?Recent Labs  ?  03/24/22 ?2325 03/25/22 ?0734 03/25/22 ?1208  ?GLUCAP 291* 313* 298*  ? ?  ? ?CKD stage IIIb: At baseline-follow electrolytes periodically. ? ?Hypothyroidism: Continue levothyroxine-but will  change to IV route of as she is n.p.o. ? ?BMI: ?Estimated body mass index is 30.2 kg/m? as calculated from the following: ?  Height as of this encounter: '5\' 2"'$  (1.575 m). ?  Weight as of this encounter: 74.9 kg.  ? ?Code status: ?  Code Status: Partial Code  ? ?DVT Prophylaxis: ?SCD's Start: 03/23/22 1948 ?SQ Lovenox ?  ?Family  Communication: Daughter-[name] [phone]-updated at bedside. ? ?Disposition Plan: ?Status is: Inpatient ?Remains inpatient appropriate because: Acute CVA-likely will require PEG tube-and discharged to SNF. ?  ?Planned Discharge Destination: Probably SNF sometime next week. ? ? ?Diet: ?Diet Order   ? ?       ?  Diet NPO time specified Except for: Ice Chips  Diet effective now       ?  ? ?  ?  ? ?  ?  ? ? ?Antimicrobial agents: ?Anti-infectives (From admission, onward)  ? ? None  ? ?  ? ? ? ?MEDICATIONS: ?Scheduled Meds: ? amLODipine  10 mg Oral Daily  ? feeding supplement (PROSource TF)  45 mL Per Tube BID  ? free water  150 mL Per Tube Q4H  ? gemfibrozil  600 mg Oral Daily  ? glycopyrrolate  0.1 mg Intravenous TID  ? insulin aspart  0-20 Units Subcutaneous TID WC  ? insulin aspart  0-5 Units Subcutaneous QHS  ? insulin glargine-yfgn  12 Units Subcutaneous Daily  ? levothyroxine  88 mcg Oral QAC breakfast  ? scopolamine  1 patch Transdermal Q72H  ? ?Continuous Infusions: ? dextrose 5 % and 0.9% NaCl    ? feeding supplement (JEVITY 1.5 CAL/FIBER) Stopped (03/25/22 0000)  ? ?PRN Meds:.acetaminophen **OR** acetaminophen (TYLENOL) oral liquid 160 mg/5 mL **OR** acetaminophen, oxymetazoline, senna-docusate, sodium chloride ? ? ?I have personally reviewed following labs and imaging studies ? ?LABORATORY DATA: ?CBC: ?Recent Labs  ?Lab 03/23/22 ?0738 03/24/22 ?2341 03/25/22 ?0157  ?WBC 6.3 6.4 6.8  ?HGB 11.7* 11.6* 11.8*  ?HCT 36.2 34.0* 36.0  ?MCV 88.7 85.6 87.0  ?PLT 314 307 327  ? ? ? ?Basic Metabolic Panel: ?Recent Labs  ?Lab 03/23/22 ?0738 03/24/22 ?2341 03/25/22 ?0157  ?NA 136 132* 134*  ?K 4.1 3.6 3.7  ?CL 102 99 100  ?CO2 '23 22 23  '$ ?GLUCOSE 382* 314* 266*  ?BUN 27* 36* 33*  ?CREATININE 1.41* 1.38* 1.46*  ?CALCIUM 9.9 10.1 10.1  ?MG  --   --  2.1  ? ? ? ?GFR: ?Estimated Creatinine Clearance: 34 mL/min (A) (by C-G formula based on SCr of 1.46 mg/dL (H)). ? ?Liver Function Tests: ?No results for input(s): AST,  ALT, ALKPHOS, BILITOT, PROT, ALBUMIN in the last 168 hours. ?No results for input(s): LIPASE, AMYLASE in the last 168 hours. ?No results for input(s): AMMONIA in the last 168 hours. ? ?Coagulation Profile: ?Recent Labs  ?Lab 03/24/22 ?2341  ?INR 1.1  ? ? ?Cardiac Enzymes: ?No results for input(s): CKTOTAL, CKMB, CKMBINDEX, TROPONINI in the last 168 hours. ? ?BNP (last 3 results) ?No results for input(s): PROBNP in the last 8760 hours. ? ?Lipid Profile: ?Recent Labs  ?  03/24/22 ?0214  ?CHOL 219*  ?HDL 41  ?LDLCALC 142*  ?TRIG 182*  ?CHOLHDL 5.3  ? ? ? ?Thyroid Function Tests: ?Recent Labs  ?  03/25/22 ?0157  ?TSH 1.343  ?FREET4 0.68  ? ? ?Anemia Panel: ?No results for input(s): VITAMINB12, FOLATE, FERRITIN, TIBC, IRON, RETICCTPCT in the last 72 hours. ? ?Urine analysis: ?   ?Component Value Date/Time  ?  COLORURINE YELLOW 03/23/2022 1158  ? APPEARANCEUR CLEAR 03/23/2022 1158  ? LABSPEC 1.013 03/23/2022 1158  ? PHURINE 6.0 03/23/2022 1158  ? GLUCOSEU >=500 (A) 03/23/2022 1158  ? HGBUR NEGATIVE 03/23/2022 1158  ? BILIRUBINUR NEGATIVE 03/23/2022 1158  ? BILIRUBINUR neg 02/06/2018 1620  ? KETONESUR NEGATIVE 03/23/2022 1158  ? PROTEINUR 100 (A) 03/23/2022 1158  ? UROBILINOGEN 0.2 02/06/2018 1620  ? UROBILINOGEN 0.2 05/21/2015 2131  ? NITRITE NEGATIVE 03/23/2022 1158  ? LEUKOCYTESUR NEGATIVE 03/23/2022 1158  ? ? ?Sepsis Labs: ?Lactic Acid, Venous ?   ?Component Value Date/Time  ? LATICACIDVEN 0.63 08/11/2015 1612  ? ? ?MICROBIOLOGY: ?No results found for this or any previous visit (from the past 240 hour(s)). ? ?RADIOLOGY STUDIES/RESULTS: ?DG Chest 1 View ? ?Result Date: 03/25/2022 ?CLINICAL DATA:  Near syncope.  Nausea. EXAM: CHEST  1 VIEW COMPARISON:  Chest radiographs 02/06/2017 FINDINGS: A loop recorder has been placed. The cardiomediastinal silhouette is within normal limits. The lungs are hypoinflated. No focal airspace consolidation, overt pulmonary edema, sizable pleural effusion, or pneumothorax is identified.  Small density in the left mid upper abdomen may represent residual oral contrast material. IMPRESSION: Hypoinflation without evidence of acute airspace disease. Electronically Signed   By: Logan Bores M.D.   On

## 2022-03-25 NOTE — Progress Notes (Signed)
Pharmacy Antibiotic Note ? ?[name] is a 71 y.o. female  with aspiration pneumonia.  Pharmacy has been consulted for Unasyn dosing. ? ?Plan: ?-Unasyn 3gm IV q8h ?-Will follow renal function and clinical progress ? ? ?Height: '5\' 2"'$  (157.5 cm) ?Weight: 74.9 kg (165 lb 2 oz) ?IBW/kg (Calculated) : 50.1 ? ?Temp (24hrs), Avg:98 ?F (36.7 ?C), Min:97.8 ?F (36.6 ?C), Max:98.2 ?F (36.8 ?C) ? ?Recent Labs  ?Lab 03/23/22 ?0738 03/24/22 ?2341 03/25/22 ?0157  ?WBC 6.3 6.4 6.8  ?CREATININE 1.41* 1.38* 1.46*  ?  ?Estimated Creatinine Clearance: 34 mL/min (A) (by C-G formula based on SCr of 1.46 mg/dL (H)).   ? ?Allergies  ?Allergen Reactions  ? Dilantin [Phenytoin Sodium Extended]   ?  Rash/itching  ? Insulins Rash  ?  Humalog 75/25 only  ? Statins Nausea Only and Rash  ?  "sick to my stomach"; describes "rash and a lump" while taking statins  ? ? ? ?Thank you for allowing pharmacy to be a part of this patient?s care. ? ?Hildred Laser, PharmD ?Clinical Pharmacist ?**Pharmacist phone directory can now be found on [email or web address] ([email or web address]).  Listed under [facility]. ? ? ?

## 2022-03-25 NOTE — Progress Notes (Signed)
eLink Physician-Brief Progress Note ?Patient Name: [name] ?DOB: [birth year] ?MRN: [id number] ? ? ?Date of Service ? 03/25/2022  ?HPI/Events of Note ? 71/F who was admitted 03/23/22 for a L medullary stroke. She has been having dysphagia, and this afternoon was having episodes of desaturation. RT had been suctioning a moderate amount of secretions from her airway, and was requiring increased O2 support. She was transferred to the ICU for close monitoring and further management.   ?eICU Interventions ? - Transfer to ICU  ?- Maintain on ventimask for now. ?- If she continues to have increasing O2 requirement, would place on trial of  nonrebreather vs. HFNC. Given dysphagia, pt would be a poor candidate for CPAP/BIPAP.  ?- As per notes, pt agreeable to intubation if needed, but would not want to be on ventilatory support indefinitely.  ?- Maintain aspiration precautions ?- Suction secretions PRN.  ?- Continue empiric unasyn. Check cultures, deescalate as warranted.  ?- Monitor I/Os closely. Would avoid volume overload states.   ? ? ? ?  ? ?[place] ?03/25/2022, 8:06 PM ?

## 2022-03-25 NOTE — Progress Notes (Addendum)
STROKE TEAM PROGRESS NOTE  ? ?INTERVAL HISTORY ?Patient is seen in her room with one family member at the bedside.  Yesterday, she experienced bleeding from her nose after Cortrak placement.  Cortrak was discontinued, and nose was packed with gauze.  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 ? ?Vitals:  ? 03/25/22 0141 03/25/22 0323 03/25/22 0400 03/25/22 0732  ?BP:  (!) 158/71 (!) 159/71 133/72  ?Pulse: 86 85 80 92  ?Resp: '20 20 17 20  '$ ?Temp:  97.8 ?F (36.6 ?C)  98.2 ?F (36.8 ?C)  ?TempSrc:  Oral  Oral  ?SpO2: 97% 96% 94% 93%  ?Weight: 74.9 kg     ?Height:      ? ?CBC:  ?Recent Labs  ?Lab 03/24/22 ?2341 03/25/22 ?0157  ?WBC 6.4 6.8  ?HGB 11.6* 11.8*  ?HCT 34.0* 36.0  ?MCV 85.6 87.0  ?PLT 307 327  ? ? ?Basic Metabolic Panel:  ?Recent Labs  ?Lab 03/24/22 ?2341 03/25/22 ?0157  ?NA 132* 134*  ?K 3.6 3.7  ?CL 99 100  ?CO2 22 23  ?GLUCOSE 314* 266*  ?BUN 36* 33*  ?CREATININE 1.38* 1.46*  ?CALCIUM 10.1 10.1  ?MG  --  2.1  ? ? ?Lipid Panel:  ?Recent Labs  ?Lab 03/24/22 ?0214  ?CHOL 219*  ?TRIG 182*  ?HDL 41  ?CHOLHDL 5.3  ?VLDL 36  ?LDLCALC 142*  ? ? ?HgbA1c:  ?Recent Labs  ?Lab 03/24/22 ?0214  ?HGBA1C 9.5*  ? ? ?Urine Drug Screen: No results for input(s): LABOPIA, COCAINSCRNUR, LABBENZ, AMPHETMU, THCU, LABBARB in the last 168 hours.  ?Alcohol Level No results for input(s): ETH in the last 168 hours. ? ?IMAGING past 24 hours ?DG Chest 1 View ? ?Result Date: 03/25/2022 ?CLINICAL DATA:  Near syncope.  Nausea. EXAM: CHEST  1 VIEW COMPARISON:  Chest radiographs 02/06/2017 FINDINGS: A loop recorder has been placed. The cardiomediastinal silhouette is within normal limits. The lungs are hypoinflated. No focal airspace consolidation, overt pulmonary edema, sizable pleural effusion, or pneumothorax is identified. Small density in the left mid upper abdomen may represent residual oral contrast material. IMPRESSION: Hypoinflation without evidence of acute airspace disease. Electronically Signed   By: Logan Bores M.D.   On: 03/25/2022 11:12  ? ?MR ANGIO HEAD WO CONTRAST ? ?Result Date: 03/25/2022 ?CLINICAL DATA:  Stroke follow-up EXAM: MRI HEAD WITHOUT CONTRAST MRA HEAD WITHOUT CONTRAST MRA NECK WITHOUT CONTRAST TECHNIQUE: Multiplanar, multiecho pulse sequences of the brain and surrounding structures were obtained without intravenous contrast. Angiographic images of the Circle of Willis were obtained using MRA technique without intravenous contrast. Angiographic images of the neck were obtained using MRA technique without intravenous contrast. Carotid stenosis measurements (when applicable) are obtained utilizing NASCET criteria, using the distal internal carotid diameter as the denominator. COMPARISON:  Brain MRI from 2 days ago FINDINGS: MRI HEAD FINDINGS Only diffusion and gradient imaging was acquired per request. 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 No new area of infarction and no acute hemorrhage. No hydrocephalus or shift. MRA HEAD FINDINGS Atheromatous irregularity of the carotid siphons and bilateral MCA vessels. Multifocal moderate narrowings are seen at the bilateral M1 and M2 levels. In the posterior circulation there is high-grade narrowing of the non dominant left distal V4 segment. Negative for aneurysm or generalized beading. MRA NECK FINDINGS Normal diameter arch with 3 vessel branching. Mild atheromatous plaque at the bilateral carotid bulb. No carotid stenosis or ulceration. No proximal subclavian stenosis. The vertebral arteries are smoothly contoured and  diffusely patent to the level of the dura. IMPRESSION: Brain MRI: Limited acquisition shows no progression of acute left medulla infarcts. Intracranial MRA: High-grade narrowing of the non dominant left V4 segment. Unremarkable left cerebellar branches. Moderate atheromatous narrowing of bilateral MCA. Neck MRA: Cervical carotid bifurcation atherosclerosis. No flow limiting stenosis of major arteries in the neck.  Electronically Signed   By: Jorje Guild M.D.   On: 03/25/2022 11:06  ? ?MR ANGIO NECK WO CONTRAST ? ?Result Date: 03/25/2022 ?CLINICAL DATA:  Stroke follow-up EXAM: MRI HEAD WITHOUT CONTRAST MRA HEAD WITHOUT CONTRAST MRA NECK WITHOUT CONTRAST TECHNIQUE: Multiplanar, multiecho pulse sequences of the brain and surrounding structures were obtained without intravenous contrast. Angiographic images of the Circle of Willis were obtained using MRA technique without intravenous contrast. Angiographic images of the neck were obtained using MRA technique without intravenous contrast. Carotid stenosis measurements (when applicable) are obtained utilizing NASCET criteria, using the distal internal carotid diameter as the denominator. COMPARISON:  Brain MRI from 2 days ago FINDINGS: MRI HEAD FINDINGS Only diffusion and gradient imaging was acquired per request. 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 No new area of infarction and no acute hemorrhage. No hydrocephalus or shift. MRA HEAD FINDINGS Atheromatous irregularity of the carotid siphons and bilateral MCA vessels. Multifocal moderate narrowings are seen at the bilateral M1 and M2 levels. In the posterior circulation there is high-grade narrowing of the non dominant left distal V4 segment. Negative for aneurysm or generalized beading. MRA NECK FINDINGS Normal diameter arch with 3 vessel branching. Mild atheromatous plaque at the bilateral carotid bulb. No carotid stenosis or ulceration. No proximal subclavian stenosis. The vertebral arteries are smoothly contoured and diffusely patent to the level of the dura. IMPRESSION: Brain MRI: Limited acquisition shows no progression of acute left medulla infarcts. Intracranial MRA: High-grade narrowing of the non dominant left V4 segment. Unremarkable left cerebellar branches. Moderate atheromatous narrowing of bilateral MCA. Neck MRA: Cervical carotid bifurcation atherosclerosis. No flow limiting stenosis of  major arteries in the neck. Electronically Signed   By: Jorje Guild M.D.   On: 03/25/2022 11:06  ? ?MR BRAIN WO CONTRAST ? ?Result Date: 03/25/2022 ?CLINICAL DATA:  Stroke follow-up EXAM: MRI HEAD WITHOUT CONTRAST MRA HEAD WITHOUT CONTRAST MRA NECK WITHOUT CONTRAST TECHNIQUE: Multiplanar, multiecho pulse sequences of the brain and surrounding structures were obtained without intravenous contrast. Angiographic images of the Circle of Willis were obtained using MRA technique without intravenous contrast. Angiographic images of the neck were obtained using MRA technique without intravenous contrast. Carotid stenosis measurements (when applicable) are obtained utilizing NASCET criteria, using the distal internal carotid diameter as the denominator. COMPARISON:  Brain MRI from 2 days ago FINDINGS: MRI HEAD FINDINGS Only diffusion and gradient imaging was acquired per request. 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 No new area of infarction and no acute hemorrhage. No hydrocephalus or shift. MRA HEAD FINDINGS Atheromatous irregularity of the carotid siphons and bilateral MCA vessels. Multifocal moderate narrowings are seen at the bilateral M1 and M2 levels. In the posterior circulation there is high-grade narrowing of the non dominant left distal V4 segment. Negative for aneurysm or generalized beading. MRA NECK FINDINGS Normal diameter arch with 3 vessel branching. Mild atheromatous plaque at the bilateral carotid bulb. No carotid stenosis or ulceration. No proximal subclavian stenosis. The vertebral arteries are smoothly contoured and diffusely patent to the level of the dura. IMPRESSION: Brain MRI: Limited acquisition shows no progression of acute left medulla infarcts.  Intracranial MRA: High-grade narrowing of the non dominant left V4 segment. Unremarkable left cerebellar branches. Moderate atheromatous narrowing of bilateral MCA. Neck MRA: Cervical carotid bifurcation atherosclerosis. No flow  limiting stenosis of major arteries in the neck. Electronically Signed   By: Jorje Guild M.D.   On: 03/25/2022 11:06  ? ?DG Abd Portable 1V ? ?Result Date: 03/24/2022 ?CLINICAL DATA:  Nasogastric tube placeme

## 2022-03-25 NOTE — Progress Notes (Signed)
PT Cancellation Note ? ?Patient Details ?Name: [name] ?MRN: [id number] ?DOB: [birth year] ? ? ?Cancelled Treatment:    Reason Eval/Treat Not Completed: Patient at procedure or test/unavailable ? ?Patient off the floor. Will reattempt later today. ? ? ?Arby Barrette, PT ?Acute Rehabilitation Services  ?Pager [phone] ?Office [phone] ? ?[name] ?03/25/2022, 10:52 AM ?

## 2022-03-25 NOTE — Progress Notes (Signed)
Inpatient Rehab Admissions Coordinator:   Per therapy recommendations,  patient was screened for CIR candidacy by Girlie Veltri, MS, CCC-SLP . At this time, Pt. Appears to be a a potential candidate for CIR. I will request   order for rehab consult per protocol for full assessment. Please contact me any with questions.  Tobyn Osgood, MS, CCC-SLP Rehab Admissions Coordinator  [phone] (celll) [phone] (office)  

## 2022-03-25 NOTE — Progress Notes (Signed)
Has gotten more secretions-voice quality has decreased-RT suctioned quite a bit of thick secretions while back-now requiring 40% FIO2. Will start Unasyn and consult PCCM ? ?Initially-she was a DNI-but given her overall situation and to "buy time" she is ok with a few days on a ventilator-but does not want to just prolong things. I updated patients daughter [name] the phone.  ?

## 2022-03-25 NOTE — Progress Notes (Signed)
Inpatient Diabetes Program Recommendations ? ?AACE/ADA: New Consensus Statement on Inpatient Glycemic Control (2015) ? ?Target Ranges:  Prepandial:   less than 140 mg/dL ?     Peak postprandial:   less than 180 mg/dL (1-2 hours) ?     Critically ill patients:  140 - 180 mg/dL  ? ?Lab Results  ?Component Value Date  ? GLUCAP 298 (H) 03/25/2022  ? HGBA1C 9.5 (H) 03/24/2022  ? ? ?Review of Glycemic Control ? ?Diabetes history: DM2 ?Outpatient Diabetes medications: Lantus 26 units BID ?Current orders for Inpatient glycemic control: Semglee 12 units QD, Novolog 0-9 Q4 ? ?HgbA1C - 9.5% ?CBGs 313, 298 mg/dL ?Cortrac out d/t nose bleed ? ?Inpatient Diabetes Program Recommendations:   ? ?Increase Semglee to 20 units QD ?Increase Novolog to 0-15 units Q4H ? ?Continue to follow. ? ?Thank you. ?Lorenda Peck, RD, LDN, CDE ?Inpatient Diabetes Coordinator ?[phone]  ? ? ? ? ? ? ?

## 2022-03-25 NOTE — Progress Notes (Signed)
1 to 2 L of O2 recommended for pt by Ghimire,MD and respiratory therapist. O2 setup at bedside. Offered O2 pt declined at this time. MD notified. ?

## 2022-03-25 NOTE — Progress Notes (Signed)
See epic for multiple orders from Dr. Marlowe Sax. ?

## 2022-03-25 NOTE — Progress Notes (Signed)
RT note- Called to patient room for low sp02 84%, will not wear [place], VM at 40% placed on patient, only concern is with secretions, now has difficulty using yankauer with moderate amount oral secretions.. She is tolerating VM at this time well, MD has been notified. Continue to monitor. ?

## 2022-03-25 NOTE — Evaluation (Signed)
Physical Therapy Evaluation ?Patient Details ?Name: [name] ?MRN: [id number] ?DOB: [birth year] ?Today's Date: 03/25/2022 ? ?History of Present Illness ? Pt is a 71 year old woman admitted on 03/23/22 with dizziness, decreased balance and hoarse voice. + stroke L medulla. PMH: CVA, DM@, CKDIII, PVD, HTN, hypothyroidism.  ?Clinical Impression ?  ?Pt admitted secondary to problem above with deficits below. PTA patient was living alone and driving.  Pt currently requires min assist for squat-pivot transfer. She is impulsive, however this may in part been due to her sats being low and wet, gurgly, choking sound due to her secretions. Patient much more attentive and calm once settled in chair and sats incr to 92%. Patient having vertigo and double-vision and feel she will need intensity of AIR to regain independence and ability to live alone. Anticipate patient will benefit from PT to address problems listed below.Will continue to follow acutely to maximize functional mobility independence and safety.   ?   ?   ? ?Recommendations for follow up therapy are one component of a multi-disciplinary discharge planning process, led by the attending physician.  Recommendations may be updated based on patient status, additional functional criteria and insurance authorization. ? ?Follow Up Recommendations Acute inpatient rehab (3hours/day) ? ?  ?Assistance Recommended at Discharge Frequent or constant Supervision/Assistance  ?Patient can return home with the following ? Two people to help with walking and/or transfers;A lot of help with bathing/dressing/bathroom;Assistance with cooking/housework;Direct supervision/assist for medications management;Direct supervision/assist for financial management;Help with stairs or ramp for entrance ? ?  ?Equipment Recommendations Rolling walker (2 wheels)  ?Recommendations for Other Services ? Rehab consult  ?  ?Functional Status Assessment Patient has had a recent decline in their  functional status and demonstrates the ability to make significant improvements in function in a reasonable and predictable amount of time.  ? ?  ?Precautions / Restrictions Precautions ?Precautions: Fall ?Precaution Comments: impulsive ?Restrictions ?Weight Bearing Restrictions: No  ? ?  ? ?Mobility ? Bed Mobility ?Overal bed mobility: Needs Assistance ?Bed Mobility: Supine to Sit ?  ?  ?Supine to sit: Min assist ?  ?  ?General bed mobility comments: pt impulsively trying to sit at EOB with gurgling sound in her throat and sats 84%; assisted for safety ?  ? ?Transfers ?Overall transfer level: Needs assistance ?Equipment used: None ?  ?  ?  ?  ?Squat pivot transfers: Min assist ?  ?  ?General transfer comment: pt impulsively began transfer (and continued) despite PT asking her to stop and sit/wait. She did not fully stand and stayed low as she pivoted around to chair on her left. Pt reports room spinning and double vision ?  ? ?Ambulation/Gait ?  ?  ?  ?  ?  ?  ?  ?General Gait Details: unable to assess due to dizziness/vertigo and only +1 assist ? ?Stairs ?  ?  ?  ?  ?  ? ?Wheelchair Mobility ?  ? ?Modified Rankin (Stroke Patients Only) ?Modified Rankin (Stroke Patients Only) ?Pre-Morbid Rankin Score: No symptoms ?Modified Rankin: Severe disability ? ?  ? ?Balance Overall balance assessment: Needs assistance ?Sitting-balance support: Bilateral upper extremity supported, Feet supported ?Sitting balance-Leahy Scale: Poor ?Sitting balance - Comments: holding onto bed due to vertigo ?  ?  ?  ?  ?  ?  ?  ?  ?  ?  ?  ?  ?  ?  ?  ?   ? ? ? ?Pertinent Vitals/Pain Pain Assessment ?Pain Assessment: No/denies  pain  ? ? ?Home Living Family/patient expects to be discharged to:: Private residence ?Living Arrangements: Alone ?Available Help at Discharge: Family;Available PRN/intermittently ?Type of Home: House ?Home Access: Stairs to enter ?Entrance Stairs-Rails: Right;Left;Can reach both (only on the 5 steps) ?Entrance  Stairs-Number of Steps: 3+5 ?  ?Home Layout: One level ?Home Equipment: Conservation officer, nature (2 wheels);Cane - single point ?   ?  ?Prior Function Prior Level of Function : Independent/Modified Independent;Driving ?  ?  ?  ?  ?  ?  ?  ?  ?  ? ? ?Hand Dominance  ? Dominant Hand: Right ? ?  ?Extremity/Trunk Assessment  ? Upper Extremity Assessment ?Upper Extremity Assessment: Defer to OT evaluation ?  ? ?Lower Extremity Assessment ?Lower Extremity Assessment: RLE deficits/detail ?RLE Deficits / Details: AROM WFL; knee extension 3+, knee flexion 3+; ankle DF 5/5 ?RLE Coordination: WNL ?  ? ?Cervical / Trunk Assessment ?Cervical / Trunk Assessment: Normal  ?Communication  ? Communication: Expressive difficulties (mumbles; left facial droop)  ?Cognition Arousal/Alertness: Awake/alert ?Behavior During Therapy: Impulsive ?Overall Cognitive Status: Impaired/Different from baseline ?Area of Impairment: Problem solving, Following commands, Safety/judgement, Awareness ?  ?  ?  ?  ?  ?  ?  ?  ?  ?  ?  ?Following Commands: Follows one step commands consistently ?Safety/Judgement: Decreased awareness of safety, Decreased awareness of deficits ?Awareness: Intellectual ?Problem Solving: Slow processing, Difficulty sequencing, Requires verbal cues, Requires tactile cues ?General Comments: not specifically tested ?  ?  ? ?  ?General Comments General comments (skin integrity, edema, etc.): pt sleeping on arrival on RA with sats 84% (pt leaning over on left side). Awakened and pt very wet, gurgly sounding and trying to use her suction but without success. HR 122 bpm. Assisted to sit at EOB with sats slowly coming up to 88% and then 90% with HR down to 112 bpm. Up in chair at end of session with sats 92% on RA ? ?  ?Exercises    ? ?Assessment/Plan  ?  ?PT Assessment Patient needs continued PT services  ?PT Problem List Decreased strength;Decreased balance;Decreased mobility;Decreased cognition;Decreased knowledge of use of DME;Decreased  safety awareness;Decreased knowledge of precautions;Cardiopulmonary status limiting activity ? ?   ?  ?PT Treatment Interventions DME instruction;Gait training;Stair training;Functional mobility training;Therapeutic activities;Therapeutic exercise;Balance training;Neuromuscular re-education;Cognitive remediation;Patient/family education   ? ?PT Goals (Current goals can be found in the Care Plan section)  ?Acute Rehab PT Goals ?Patient Stated Goal: to get better ?PT Goal Formulation: With patient ?Time For Goal Achievement: 04/08/22 ?Potential to Achieve Goals: Good ? ?  ?Frequency Min 4X/week ?  ? ? ?Co-evaluation   ?  ?  ?  ?  ? ? ?  ?AM-PAC PT "6 Clicks" Mobility  ?Outcome Measure Help needed turning from your back to your side while in a flat bed without using bedrails?: None ?Help needed moving from lying on your back to sitting on the side of a flat bed without using bedrails?: A Little ?Help needed moving to and from a bed to a chair (including a wheelchair)?: A Little ?Help needed standing up from a chair using your arms (e.g., wheelchair or bedside chair)?: A Little ?Help needed to walk in hospital room?: Total ?Help needed climbing 3-5 steps with a railing? : Total ?6 Click Score: 15 ? ?  ?End of Session   ?Activity Tolerance: Patient tolerated treatment well ?Patient left: in chair;with call bell/phone within reach;with chair alarm set ?Nurse Communication: Mobility status;Other (comment) (low sats on  arrival; impulsive) ?PT Visit Diagnosis: Unsteadiness on feet (R26.81);Other symptoms and signs involving the nervous system (R29.898);Dizziness and giddiness (R42);Hemiplegia and hemiparesis ?Hemiplegia - Right/Left: Right ?Hemiplegia - dominant/non-dominant: Dominant ?Hemiplegia - caused by: Cerebral infarction ?  ? ?Time: 1610-9604 ?PT Time Calculation (min) (ACUTE ONLY): 23 min ? ? ?Charges:   PT Evaluation ?$PT Eval Moderate Complexity: 1 Mod ?  ?  ?   ? ? ? ?Arby Barrette, PT ?Acute Rehabilitation Services   ?Pager [phone] ?Office [phone] ? ? ?Jeanie Cooks Alyna Stensland ?03/25/2022, 2:01 PM ? ?

## 2022-03-25 NOTE — Progress Notes (Signed)
Orders placed for pt to be transferred to ICU. Several failed attempts to given report to 4[facility] ICU. Charge RN aware.  ?

## 2022-03-25 NOTE — Consult Note (Signed)
? ?NAME:  [name], MRN:  [id number], DOB:  Feb 23, 1951, LOS: 2 ?ADMISSION DATE:  03/23/2022, CONSULTATION DATE: 03/25/2022 ?REFERRING MD:  Jonetta Osgood, MD , CHIEF COMPLAINT: Increasing shortness of breath ? ?History of Present Illness:  ?71 year old female with poorly controlled diabetes, hypertension, CKD stage IIIb and prior history of CVA who presented with vertigo and progressive dysphagia, she was noted to have left medullary acute stroke, was admitted to hospital service for further management, stroke team is following.  This afternoon patient started with increasing dysphagia and unable to protect her airway, she required frequent suctioning, she became hypoxic and her oxygen requirement went up, now she is on Ventimask, PCCM was consulted for help evaluation and management ? ?Pertinent  Medical History  ? ?Past Medical History:  ?Diagnosis Date  ? Anemia   ? Arteriosclerotic cardiovascular disease   ? CKD (chronic kidney disease) stage 3, GFR 30-59 ml/min (HCC)   ? Claudication in peripheral vascular disease ([place])   ? CVA (cerebral vascular accident) [facility])   ? 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 GERD (gastroesophageal reflux disease)   ? Hyperlipidemia   ? declines statins  ? Hypertension   ? Hypothyroidism   ? Type 2 diabetes mellitus ([place]) 2000  ? Vitamin D deficiency   ? ? ? ?Significant Hospital Events: ?Including procedures, antibiotic start and stop dates in addition to other pertinent events   ? ? ?Interim History / Subjective:  ? ? ?Objective   ?Blood pressure (!) 141/73, pulse 94, temperature 97.8 ?F (36.6 ?C), temperature source Oral, resp. rate 20, height '5\' 2"'$  (1.575 m), weight 74.9 kg, SpO2 93 %. ?   ?   ?No intake or output data in the 24 hours ending 03/25/22 1753 ?Filed Weights  ? 03/23/22 0736 03/25/22 0141  ?Weight: 72.6 kg 74.9 kg  ? ? ?Examination: ?  ?Physical exam: ?General: Acute on chronically ill-appearing female, lying on  the bed ?HEENT: [place]/AT, eyes anicteric.  moist mucus membranes ?Neuro: Alert, awake following commands.  She does have dysphonia and left facial droop, her uvula is shifted to the right ?Chest: Coarse breath sounds, no wheezes or rhonchi ?Heart: Regular rate and rhythm, no murmurs or gallops ?Abdomen: Soft, nontender, nondistended, bowel sounds present ?Skin: No rash ? ?Resolved Hospital Problem list   ? ? ?Assessment & Plan:  ?Acute left medullary stroke ?Progressive dysphagia due to stroke ?Acute hypoxic respiratory failure ?Probable aspiration pneumonia ?Hypertension ?Hyperlipidemia ?Poorly controlled diabetes with hyperglycemia ?CKD stage IIIb ?Hypothyroidism ?Epistaxis ? ?Continue neuro watch every hour ?Stroke team is following ?Continue aspirin and statin ?Speech and swallow evaluation, keep patient n.p.o. for now ?Patient oxygen requirement went up due to hypoxia this afternoon, she is unable to protect her airway with increasing dysphagia and dysphonia ?I think she is aspirating ?She was started on IV Unasyn ?Currently on Ventimask but remain high risk for endotracheal intubation, she does not want to remain on ventilator for long period of time but she would like to try for few days if that makes her better ?Continue insulin with CBG goal 140-180 ?Serum creatinine is at baseline ?Continue levothyroxine ?Monitor H&H ? ?Best Practice (right click and "Reselect all SmartList Selections" daily)  ? ?Diet/type: NPO ?DVT prophylaxis: SCD ?GI prophylaxis: PPI ?Lines: N/A ?Foley:  N/A ?Code Status:  full code ?Last date of multidisciplinary goals of care discussion [5/13 spoke with patient and she would like to remain full code for  now and if she needs to go on ventilator she is willing for short period of time but she would not want to remain on ventilator for now. ? ?Labs   ?CBC: ?Recent Labs  ?Lab 03/23/22 ?0738 03/24/22 ?2341 03/25/22 ?0157  ?WBC 6.3 6.4 6.8  ?HGB 11.7* 11.6* 11.8*  ?HCT 36.2 34.0* 36.0  ?MCV  88.7 85.6 87.0  ?PLT 314 307 327  ? ? ?Basic Metabolic Panel: ?Recent Labs  ?Lab 03/23/22 ?0738 03/24/22 ?2341 03/25/22 ?0157  ?NA 136 132* 134*  ?K 4.1 3.6 3.7  ?CL 102 99 100  ?CO2 '23 22 23  '$ ?GLUCOSE 382* 314* 266*  ?BUN 27* 36* 33*  ?CREATININE 1.41* 1.38* 1.46*  ?CALCIUM 9.9 10.1 10.1  ?MG  --   --  2.1  ? ?GFR: ?Estimated Creatinine Clearance: 34 mL/min (A) (by C-G formula based on SCr of 1.46 mg/dL (H)). ?Recent Labs  ?Lab 03/23/22 ?0738 03/24/22 ?2341 03/25/22 ?0157  ?WBC 6.3 6.4 6.8  ? ? ?Liver Function Tests: ?No results for input(s): AST, ALT, ALKPHOS, BILITOT, PROT, ALBUMIN in the last 168 hours. ?No results for input(s): LIPASE, AMYLASE in the last 168 hours. ?No results for input(s): AMMONIA in the last 168 hours. ? ?ABG ?   ?Component Value Date/Time  ? TCO2 28 02/15/2018 2353  ?  ? ?Coagulation Profile: ?Recent Labs  ?Lab 03/24/22 ?2341  ?INR 1.1  ? ? ?Cardiac Enzymes: ?No results for input(s): CKTOTAL, CKMB, CKMBINDEX, TROPONINI in the last 168 hours. ? ?HbA1C: ?Hemoglobin A1C  ?Date/Time Value Ref Range Status  ?01/01/2019 01:43 PM 9.5 (A) 4.0 - 5.6 % Final  ?09/25/2018 03:11 PM 7.7 (A) 4.0 - 5.6 % Final  ? ?HbA1c POC (<> result, manual entry)  ?Date/Time Value Ref Range Status  ?03/25/2019 10:10 AM >14.0 (A) 4.0 - 5.6 % Final  ? ?Hgb A1c MFr Bld  ?Date/Time Value Ref Range Status  ?03/24/2022 02:14 AM 9.5 (H) 4.8 - 5.6 % Final  ?  Comment:  ?  (NOTE) ?Pre diabetes:          5.7%-6.4% ? ?Diabetes:              >6.4% ? ?Glycemic control for   <7.0% ?adults with diabetes ?  ?02/16/2018 02:00 AM 8.9 (H) 4.8 - 5.6 % Final  ?  Comment:  ?  (NOTE) ?Pre diabetes:          5.7%-6.4% ?Diabetes:              >6.4% ?Glycemic control for   <7.0% ?adults with diabetes ?  ? ? ?CBG: ?Recent Labs  ?Lab 03/24/22 ?2325 03/25/22 ?0734 03/25/22 ?1208 03/25/22 ?1351 03/25/22 ?1603  ?GLUCAP 291* 313* 298* 294* 335*  ? ? ?Review of Systems:   ?12 point review of systems significant for complaint mentioned ? ?Rest is  negative ? ?Past Medical History:  ?She,  has a past medical history of Anemia, Arteriosclerotic cardiovascular disease, CKD (chronic kidney disease) stage 3, GFR 30-59 ml/min (HCC), Claudication in peripheral vascular disease ([place]), CVA (cerebral vascular accident) ([place]), GERD (gastroesophageal reflux disease), Hyperlipidemia, Hypertension, Hypothyroidism, Type 2 diabetes mellitus ([place]) (2000), and Vitamin D deficiency.  ? ?Surgical History:  ? ?Past Surgical History:  ?Procedure Laterality Date  ? CESAREAN SECTION    ? COLONOSCOPY  09/21/2020  ? LEFT HEART CATHETERIZATION WITH CORONARY ANGIOGRAM N/A 03/08/2012  ? Procedure: LEFT HEART CATHETERIZATION WITH CORONARY ANGIOGRAM;  Surgeon: Sinclair Grooms, MD;  Location: [facility] CATH LAB;  Service: Cardiovascular;  Laterality: N/A;  ? LEFT HEART CATHETERIZATION WITH CORONARY ANGIOGRAM N/A 12/17/2012  ? Procedure: LEFT HEART CATHETERIZATION WITH CORONARY ANGIOGRAM;  Surgeon: Laverda Page, MD;  Location: [facility] CATH LAB;  Service: Cardiovascular;  Laterality: N/A;  ? LOOP RECORDER INSERTION N/A 03/05/2018  ? Procedure: LOOP RECORDER INSERTION;  Surgeon: Evans Lance, MD;  Location: [facility] CV LAB;  Service: Cardiovascular;  Laterality: N/A;  ? LOWER EXTREMITY ANGIOGRAM N/A 12/17/2012  ? Procedure: LOWER EXTREMITY ANGIOGRAM;  Surgeon: Laverda Page, MD;  Location: [facility] CATH LAB;  Service: Cardiovascular;  Laterality: N/A;  ? TEE WITHOUT CARDIOVERSION N/A 02/28/2018  ? Procedure: TRANSESOPHAGEAL ECHOCARDIOGRAM (TEE);  Surgeon: Jerline Pain, MD;  Location: [facility] ENDOSCOPY;  Service: Cardiovascular;  Laterality: N/A;  ? TUBAL LIGATION    ?  ? ?Social History:  ? reports that she quit smoking about 11 years ago. She has never used smokeless tobacco. She reports that she does not drink alcohol and does not use drugs.  ? ?Family History:  ?Her family history includes Diabetes in her brother, father, mother, and sister; Prostate cancer in her brother. There is no history of  Esophageal cancer, Rectal cancer, Stomach cancer, Colon cancer, or Breast cancer.  ? ?Allergies ?Allergies  ?Allergen Reactions  ? Dilantin [Phenytoin Sodium Extended]   ?  Rash/itching  ? Insulins Rash  ?  Humal

## 2022-03-26 ENCOUNTER — Inpatient Hospital Stay (HOSPITAL_COMMUNITY): Payer: Medicare Other

## 2022-03-26 DIAGNOSIS — I10 Essential (primary) hypertension: Secondary | ICD-10-CM | POA: Diagnosis not present

## 2022-03-26 DIAGNOSIS — I469 Cardiac arrest, cause unspecified: Secondary | ICD-10-CM

## 2022-03-26 DIAGNOSIS — I63212 Cerebral infarction due to unspecified occlusion or stenosis of left vertebral arteries: Secondary | ICD-10-CM | POA: Diagnosis not present

## 2022-03-26 DIAGNOSIS — E782 Mixed hyperlipidemia: Secondary | ICD-10-CM | POA: Diagnosis not present

## 2022-03-26 DIAGNOSIS — E1159 Type 2 diabetes mellitus with other circulatory complications: Secondary | ICD-10-CM | POA: Diagnosis not present

## 2022-03-26 DIAGNOSIS — I2510 Atherosclerotic heart disease of native coronary artery without angina pectoris: Secondary | ICD-10-CM | POA: Diagnosis not present

## 2022-03-26 DIAGNOSIS — J9601 Acute respiratory failure with hypoxia: Secondary | ICD-10-CM

## 2022-03-26 LAB — BASIC METABOLIC PANEL
Anion gap: 10 (ref 5–15)
BUN: 46 mg/dL — ABNORMAL HIGH (ref 8–23)
CO2: 25 mmol/L (ref 22–32)
Calcium: 9.3 mg/dL (ref 8.9–10.3)
Chloride: 103 mmol/L (ref 98–111)
Creatinine, Ser: 1.99 mg/dL — ABNORMAL HIGH (ref 0.44–1.00)
GFR, Estimated: 27 mL/min — ABNORMAL LOW (ref >60)
Glucose, Bld: 176 mg/dL — ABNORMAL HIGH (ref 70–99)
Potassium: 4.1 mmol/L (ref 3.5–5.1)
Sodium: 138 mmol/L (ref 135–145)

## 2022-03-26 LAB — POCT I-STAT 7, (LYTES, BLD GAS, ICA,H+H)
Acid-base deficit: 2 mmol/L (ref 0.0–2.0)
Bicarbonate: 26 mmol/L (ref 20.0–28.0)
Calcium, Ion: 1.24 mmol/L (ref 1.15–1.40)
HCT: 31 % — ABNORMAL LOW (ref 36.0–46.0)
Hemoglobin: 10.5 g/dL — ABNORMAL LOW (ref 12.0–15.0)
O2 Saturation: 100 %
Patient temperature: 99.3
Potassium: 3.7 mmol/L (ref 3.5–5.1)
Sodium: 140 mmol/L (ref 135–145)
TCO2: 28 mmol/L (ref 22–32)
pCO2 arterial: 57.3 mmHg — ABNORMAL HIGH (ref 32–48)
pH, Arterial: 7.266 — ABNORMAL LOW (ref 7.35–7.45)
pO2, Arterial: 197 mmHg — ABNORMAL HIGH (ref 83–108)

## 2022-03-26 LAB — GLUCOSE, CAPILLARY
Glucose-Capillary: 177 mg/dL — ABNORMAL HIGH (ref 70–99)
Glucose-Capillary: 221 mg/dL — ABNORMAL HIGH (ref 70–99)
Glucose-Capillary: 284 mg/dL — ABNORMAL HIGH (ref 70–99)
Glucose-Capillary: 339 mg/dL — ABNORMAL HIGH (ref 70–99)
Glucose-Capillary: 233 mg/dL — ABNORMAL HIGH (ref 70–99)
Glucose-Capillary: 167 mg/dL — ABNORMAL HIGH (ref 70–99)

## 2022-03-26 LAB — MRSA NEXT GEN BY PCR, NASAL: MRSA by PCR Next Gen: NOT DETECTED

## 2022-03-26 MED ORDER — MIDAZOLAM HCL 2 MG/2ML IJ SOLN
INTRAMUSCULAR | Status: AC
  Filled 2022-03-26: qty 2

## 2022-03-26 MED ORDER — DOCUSATE SODIUM 50 MG/5ML PO LIQD
100.0000 mg | Freq: Two times a day (BID) | ORAL | Status: DC
  Administered 2022-03-26 – 2022-04-17 (×28): 100 mg
  Filled 2022-03-26 (×31): qty 10

## 2022-03-26 MED ORDER — FENTANYL CITRATE PF 50 MCG/ML IJ SOSY
25.0000 ug | PREFILLED_SYRINGE | INTRAMUSCULAR | Status: DC | PRN
  Filled 2022-03-26: qty 1

## 2022-03-26 MED ORDER — ETOMIDATE 2 MG/ML IV SOLN
INTRAVENOUS | Status: AC
  Administered 2022-03-26: 20 mg
  Filled 2022-03-26: qty 10

## 2022-03-26 MED ORDER — DEXMEDETOMIDINE HCL IN NACL 400 MCG/100ML IV SOLN
0.0000 ug/kg/h | INTRAVENOUS | Status: AC
  Administered 2022-03-26: 0.4 ug/kg/h via INTRAVENOUS
  Administered 2022-03-27: 0.5 ug/kg/h via INTRAVENOUS
  Administered 2022-03-27: 0.6 ug/kg/h via INTRAVENOUS
  Administered 2022-03-28 – 2022-03-29 (×2): 0.4 ug/kg/h via INTRAVENOUS
  Filled 2022-03-26 (×4): qty 100

## 2022-03-26 MED ORDER — POLYETHYLENE GLYCOL 3350 17 G PO PACK
17.0000 g | PACK | Freq: Every day | ORAL | Status: DC
  Administered 2022-03-27 – 2022-03-31 (×5): 17 g
  Filled 2022-03-26 (×6): qty 1

## 2022-03-26 MED ORDER — SODIUM CHLORIDE 0.9 % IV SOLN
3.0000 g | Freq: Two times a day (BID) | INTRAVENOUS | Status: DC
  Administered 2022-03-26 – 2022-03-30 (×8): 3 g via INTRAVENOUS
  Filled 2022-03-26 (×8): qty 8

## 2022-03-26 MED ORDER — ETOMIDATE 2 MG/ML IV SOLN
INTRAVENOUS | Status: AC
  Filled 2022-03-26: qty 20

## 2022-03-26 MED ORDER — FENTANYL CITRATE PF 50 MCG/ML IJ SOSY
PREFILLED_SYRINGE | INTRAMUSCULAR | Status: AC
  Filled 2022-03-26: qty 2

## 2022-03-26 MED ORDER — LACTATED RINGERS IV SOLN: INTRAVENOUS | Status: DC

## 2022-03-26 MED ORDER — ROCURONIUM BROMIDE 10 MG/ML (PF) SYRINGE
PREFILLED_SYRINGE | INTRAVENOUS | Status: AC
  Administered 2022-03-26: 100 mg
  Filled 2022-03-26: qty 10

## 2022-03-26 MED ORDER — FENTANYL CITRATE PF 50 MCG/ML IJ SOSY
25.0000 ug | PREFILLED_SYRINGE | INTRAMUSCULAR | Status: DC | PRN
  Administered 2022-03-28 – 2022-03-30 (×4): 50 ug via INTRAVENOUS
  Administered 2022-03-30 (×2): 100 ug via INTRAVENOUS
  Administered 2022-03-30 – 2022-03-31 (×2): 50 ug via INTRAVENOUS
  Administered 2022-03-31 – 2022-04-03 (×3): 100 ug via INTRAVENOUS
  Administered 2022-04-03 – 2022-04-05 (×4): 50 ug via INTRAVENOUS
  Filled 2022-03-26: qty 2
  Filled 2022-03-26: qty 1
  Filled 2022-03-26 (×2): qty 2
  Filled 2022-03-26 (×7): qty 1
  Filled 2022-03-26 (×2): qty 2
  Filled 2022-03-26 (×2): qty 1

## 2022-03-26 NOTE — Progress Notes (Signed)
Vent changes post ABG ?

## 2022-03-26 NOTE — Progress Notes (Signed)
Patient desated into the 30's on VM, unresponsive, HR brady down to 30's. Daughter, [name], at bedside, confirmed FULL code. Pulse lost, compressions started at 1106, see code documentation.  ?

## 2022-03-26 NOTE — Progress Notes (Signed)
? ?NAME:  [name], MRN:  [id number], DOB:  August 28, 1951, LOS: 3 ?ADMISSION DATE:  03/23/2022, CONSULTATION DATE: 03/25/2022 ?REFERRING MD:  Jonetta Osgood, MD , CHIEF COMPLAINT: Increasing shortness of breath ? ?History of Present Illness:  ?71 year old female with poorly controlled diabetes, hypertension, CKD stage IIIb and prior history of CVA who presented with vertigo and progressive dysphagia, she was noted to have left medullary acute stroke, was admitted to hospital service for further management, stroke team is following.  This afternoon patient started with increasing dysphagia and unable to protect her airway, she required frequent suctioning, she became hypoxic and her oxygen requirement went up, now she is on Ventimask, PCCM was consulted for help evaluation and management ? ?Pertinent  Medical History  ? ?Past Medical History:  ?Diagnosis Date  ? Anemia   ? Arteriosclerotic cardiovascular disease   ? CKD (chronic kidney disease) stage 3, GFR 30-59 ml/min (HCC)   ? Claudication in peripheral vascular disease ([place])   ? CVA (cerebral vascular accident) [facility])   ? 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 GERD (gastroesophageal reflux disease)   ? Hyperlipidemia   ? declines statins  ? Hypertension   ? Hypothyroidism   ? Type 2 diabetes mellitus ([place]) 2000  ? Vitamin D deficiency   ? ? ? ?Significant Hospital Events: ?Including procedures, antibiotic start and stop dates in addition to other pertinent events   ? ? ?Interim History / Subjective:  ?Patient remains on Ventimask, easily desats when it is removed ?She is a still in discussion about intubation with her daughter, if she fails and starts choking ?Remained afebrile ? ?Objective   ?Blood pressure 137/72, pulse (!) 115, temperature 99.3 ?F (37.4 ?C), temperature source Axillary, resp. rate 20, height '5\' 2"'$  (1.575 m), weight 74.9 kg, SpO2 99 %. ?   ?Vent Mode: PRVC ?FiO2 (%):  [60 %-100 %] 60 % ?Set  Rate:  [16 bmp-20 bmp] 20 bmp ?Vt Set:  [400 mL] 400 mL ?PEEP:  [5 cmH20] 5 cmH20 ?Plateau Pressure:  [18 cmH20] 18 cmH20  ? ?Intake/Output Summary (Last 24 hours) at 03/26/2022 1234 ?Last data filed at 03/26/2022 1200 ?Gross per 24 hour  ?Intake 1555.93 ml  ?Output 450 ml  ?Net 1105.93 ml  ? ?Filed Weights  ? 03/23/22 0736 03/25/22 0141  ?Weight: 72.6 kg 74.9 kg  ? ? ?Examination: ?  ?Physical exam: ?General: Acute on chronically ill-appearing female, lying on the bed.  On Ventimask ?HEENT: [place]/AT, eyes anicteric.  moist mucus membranes ?Neuro: Alert, awake following commands.  She does have severe dysphonia, dysphagia and left facial droop, her uvula is shifted to the right ?Chest: Crackles heard on the right side, clear to auscultation on left side, no wheezes or rhonchi ?Heart: Regular rate and rhythm, no murmurs or gallops ?Abdomen: Soft, nontender, nondistended, bowel sounds present ?Skin: No rash ? ?Resolved Hospital Problem list   ?Epistaxis ? ?Assessment & Plan:  ?Acute left medullary stroke ?Progressive dysphagia and dysphonia due to stroke ?Acute hypoxic respiratory failure ?Probable aspiration pneumonia ?Hypertension ?Hyperlipidemia ?Poorly controlled diabetes with hyperglycemia ?AKI on CKD stage IIIb ?Hypothyroidism ? ?Continue neuro watch every hour ?Stroke team is following ?Continue aspirin and statin ?She remains high risk of endotracheal intubation considering progressive dysphagia, unable to clear her secretions ?Speech and swallow evaluation, keep patient n.p.o. for now ?Patient remains on Ventimask, easily desaturates when removed ?She is actively aspirating ?Continue IV Unasyn ?Follow-up respiratory culture ?Patient  and her daughter are in discussion regarding endotracheal intubation if she continued to get worse ?Continue insulin with CBG goal 140-180 ?Serum creatinine trended up from 1.4-2 ?Started on IV fluid ?Monitor intake and output ?Avoid nephrotoxic agents ?Continue levothyroxine ?Monitor  H&H ? ?Best Practice (right click and "Reselect all SmartList Selections" daily)  ? ?Diet/type: NPO, speech and swallow evaluation ?DVT prophylaxis: SCD ?GI prophylaxis: PPI ?Lines: N/A ?Foley:  N/A ?Code Status:  full code ?Last date of multidisciplinary goals of care discussion [5/14, goals of care discussions are ongoing, updated patient and her daughter at bedside ? ?Labs   ?CBC: ?Recent Labs  ?Lab 03/23/22 ?0738 03/24/22 ?2341 03/25/22 ?0157  ?WBC 6.3 6.4 6.8  ?HGB 11.7* 11.6* 11.8*  ?HCT 36.2 34.0* 36.0  ?MCV 88.7 85.6 87.0  ?PLT 314 307 327  ? ? ?Basic Metabolic Panel: ?Recent Labs  ?Lab 03/23/22 ?0738 03/24/22 ?2341 03/25/22 ?0157 03/26/22 ?0253  ?NA 136 132* 134* 138  ?K 4.1 3.6 3.7 4.1  ?CL 102 99 100 103  ?CO2 '23 22 23 25  '$ ?GLUCOSE 382* 314* 266* 176*  ?BUN 27* 36* 33* 46*  ?CREATININE 1.41* 1.38* 1.46* 1.99*  ?CALCIUM 9.9 10.1 10.1 9.3  ?MG  --   --  2.1  --   ? ?GFR: ?Estimated Creatinine Clearance: 24.9 mL/min (A) (by C-G formula based on SCr of 1.99 mg/dL (H)). ?Recent Labs  ?Lab 03/23/22 ?0738 03/24/22 ?2341 03/25/22 ?0157  ?WBC 6.3 6.4 6.8  ? ? ?Liver Function Tests: ?No results for input(s): AST, ALT, ALKPHOS, BILITOT, PROT, ALBUMIN in the last 168 hours. ?No results for input(s): LIPASE, AMYLASE in the last 168 hours. ?No results for input(s): AMMONIA in the last 168 hours. ? ?ABG ?   ?Component Value Date/Time  ? TCO2 28 02/15/2018 2353  ?  ? ?Coagulation Profile: ?Recent Labs  ?Lab 03/24/22 ?2341  ?INR 1.1  ? ? ?Cardiac Enzymes: ?No results for input(s): CKTOTAL, CKMB, CKMBINDEX, TROPONINI in the last 168 hours. ? ?HbA1C: ?Hemoglobin A1C  ?Date/Time Value Ref Range Status  ?01/01/2019 01:43 PM 9.5 (A) 4.0 - 5.6 % Final  ?09/25/2018 03:11 PM 7.7 (A) 4.0 - 5.6 % Final  ? ?HbA1c POC (<> result, manual entry)  ?Date/Time Value Ref Range Status  ?03/25/2019 10:10 AM >14.0 (A) 4.0 - 5.6 % Final  ? ?Hgb A1c MFr Bld  ?Date/Time Value Ref Range Status  ?03/24/2022 02:14 AM 9.5 (H) 4.8 - 5.6 % Final  ?   Comment:  ?  (NOTE) ?Pre diabetes:          5.7%-6.4% ? ?Diabetes:              >6.4% ? ?Glycemic control for   <7.0% ?adults with diabetes ?  ?02/16/2018 02:00 AM 8.9 (H) 4.8 - 5.6 % Final  ?  Comment:  ?  (NOTE) ?Pre diabetes:          5.7%-6.4% ?Diabetes:              >6.4% ?Glycemic control for   <7.0% ?adults with diabetes ?  ? ? ?CBG: ?Recent Labs  ?Lab 03/25/22 ?1928 03/25/22 ?2316 03/26/22 ?0308 03/26/22 ?6222 03/26/22 ?1151  ?[place]  ? ? ?Critical care time:  ?  ? ?Total critical care time: 35 minutes ? ?Performed by: Jacky Kindle ?  ?Critical care time was exclusive of separately billable procedures and treating other patients. ?  ?Critical care was necessary to treat or prevent imminent or life-threatening deterioration. ?  ?  Critical care was time spent personally by me on the following activities: development of treatment plan with patient and/or surrogate as well as nursing, discussions with consultants, evaluation of patient's response to treatment, examination of patient, obtaining history from patient or surrogate, ordering and performing treatments and interventions, ordering and review of laboratory studies, ordering and review of radiographic studies, pulse oximetry and re-evaluation of patient's condition. ?  ?Jacky Kindle MD ?[facility] ?See Amion for pager ?If no response to pager, please call [phone] until 7pm ?After 7pm, Please call E-link [phone] ? ? ? ? ?

## 2022-03-26 NOTE — Progress Notes (Signed)
I was emergently called into the patient room because she started coughing violently, stated something is choking and then her oxygen saturation dropped to the 20s, she became asystolic, CPR was initiated and ROSC was achieved after 2 minutes of CPR with ACLS protocol, she received 1 dose of epi.  Post ROSC, patient was intubated and placed on mechanical ventilation ? ?Patient's daughter was at bedside. ? ?  ?Jacky Kindle MD ?[facility] ?See Amion for pager ?If no response to pager, please call [phone] until 7pm ?After 7pm, Please call E-link [phone] ? ?

## 2022-03-26 NOTE — Progress Notes (Signed)
Pharmacy Antibiotic Note ? ?[name] is a 71 y.o. female  with aspiration pneumonia.  Pharmacy has been consulted for Unasyn dosing. ? ?Renal function worse, SCr up to 1.99 today.  ? ?Plan: ?-Decrease Unasyn to 3gm IV q12h ?-Will follow renal function and clinical progress ? ? ?Height: '5\' 2"'$  (157.5 cm) ?Weight: 74.9 kg (165 lb 2 oz) ?IBW/kg (Calculated) : 50.1 ? ?Temp (24hrs), Avg:98.8 ?F (37.1 ?C), Min:97.7 ?F (36.5 ?C), Max:99.8 ?F (37.7 ?C) ? ?Recent Labs  ?Lab 03/23/22 ?0738 03/24/22 ?2341 03/25/22 ?0157 03/26/22 ?0253  ?WBC 6.3 6.4 6.8  --   ?CREATININE 1.41* 1.38* 1.46* 1.99*  ? ?  ?Estimated Creatinine Clearance: 24.9 mL/min (A) (by C-G formula based on SCr of 1.99 mg/dL (H)).   ? ?Allergies  ?Allergen Reactions  ? Dilantin [Phenytoin Sodium Extended]   ?  Rash/itching  ? Insulins Rash  ?  Humalog 75/25 only  ? Statins Nausea Only and Rash  ?  "sick to my stomach"; describes "rash and a lump" while taking statins  ? ? ? ?Thank you for involving pharmacy in this patient's care. ? ?Renold Genta, PharmD, BCPS ?Clinical Pharmacist ?Clinical phone for 03/26/2022 until 3p is [phone] ?03/26/2022 2:26 PM ? ?**Pharmacist phone directory can be found on [email or web address] listed under [place]** ? ? ? ?

## 2022-03-26 NOTE — Progress Notes (Signed)
STROKE TEAM PROGRESS NOTE  ? ?INTERVAL HISTORY ?Daughter is at the bedside. Pt is on NRB, O2 sats 96. Not in distress. Able to speak when off NRB, but moderate to severe dysarthria. Pt has transferred to ICU for close neuro and respiratory monitoring. CCM on board, will consider emergent intubation if respiratory failure. Currently on unasyn.  ? ?Vitals:  ? 03/26/22 0500 03/26/22 0600 03/26/22 0700 03/26/22 0800  ?BP: (!) 142/62 (!) 142/60 (!) 140/53 (!) 122/59  ?Pulse: 100 99 98 99  ?Resp: '20 20 20 17  '$ ?Temp:    99.4 ?F (37.4 ?C)  ?TempSrc:    Axillary  ?SpO2: 95% 96% 93% 96%  ?Weight:      ?Height:      ? ?CBC:  ?Recent Labs  ?Lab 03/24/22 ?2341 03/25/22 ?0157  ?WBC 6.4 6.8  ?HGB 11.6* 11.8*  ?HCT 34.0* 36.0  ?MCV 85.6 87.0  ?PLT 307 327  ? ?Basic Metabolic Panel:  ?Recent Labs  ?Lab 03/25/22 ?0157 03/26/22 ?1610  ?NA 134* 138  ?K 3.7 4.1  ?CL 100 103  ?CO2 23 25  ?GLUCOSE 266* 176*  ?BUN 33* 46*  ?CREATININE 1.46* 1.99*  ?CALCIUM 10.1 9.3  ?MG 2.1  --   ? ?Lipid Panel:  ?Recent Labs  ?Lab 03/24/22 ?0214  ?CHOL 219*  ?TRIG 182*  ?HDL 41  ?CHOLHDL 5.3  ?VLDL 36  ?[place] 142*  ? ?HgbA1c:  ?Recent Labs  ?Lab 03/24/22 ?0214  ?HGBA1C 9.5*  ? ?Urine Drug Screen: No results for input(s): LABOPIA, COCAINSCRNUR, LABBENZ, AMPHETMU, THCU, LABBARB in the last 168 hours.  ?Alcohol Level No results for input(s): ETH in the last 168 hours. ? ?IMAGING past 24 hours ?DG Chest 1 View ? ?Result Date: 03/25/2022 ?CLINICAL DATA:  Near syncope with shortness of breath. EXAM: CHEST  1 VIEW COMPARISON:  Mar 25, 2022 FINDINGS: The heart size and mediastinal contours are within normal limits. A radiopaque loop recorder device is noted. Low lung volumes are seen with mild bibasilar atelectasis. There is no evidence of a pleural effusion or pneumothorax. The visualized skeletal structures are unremarkable. IMPRESSION: Low lung volumes without acute cardiopulmonary disease. Electronically Signed   By: Virgina Norfolk M.D.   On: 03/25/2022  17:28  ? ?DG Chest 1 View ? ?Result Date: 03/25/2022 ?CLINICAL DATA:  Near syncope.  Nausea. EXAM: CHEST  1 VIEW COMPARISON:  Chest radiographs 02/06/2017 FINDINGS: A loop recorder has been placed. The cardiomediastinal silhouette is within normal limits. The lungs are hypoinflated. No focal airspace consolidation, overt pulmonary edema, sizable pleural effusion, or pneumothorax is identified. Small density in the left mid upper abdomen may represent residual oral contrast material. IMPRESSION: Hypoinflation without evidence of acute airspace disease. Electronically Signed   By: Logan Bores M.D.   On: 03/25/2022 11:12  ? ?MR ANGIO HEAD WO CONTRAST ? ?Result Date: 03/25/2022 ?CLINICAL DATA:  Stroke follow-up EXAM: MRI HEAD WITHOUT CONTRAST MRA HEAD WITHOUT CONTRAST MRA NECK WITHOUT CONTRAST TECHNIQUE: Multiplanar, multiecho pulse sequences of the brain and surrounding structures were obtained without intravenous contrast. Angiographic images of the Circle of Willis were obtained using MRA technique without intravenous contrast. Angiographic images of the neck were obtained using MRA technique without intravenous contrast. Carotid stenosis measurements (when applicable) are obtained utilizing NASCET criteria, using the distal internal carotid diameter as the denominator. COMPARISON:  Brain MRI from 2 days ago FINDINGS: MRI HEAD FINDINGS Only diffusion and gradient imaging was acquired per request. Cluster of acute infarcts in the left medulla are  unchanged. No new area of infarction and no acute hemorrhage. No hydrocephalus or shift. MRA HEAD FINDINGS Atheromatous irregularity of the carotid siphons and bilateral MCA vessels. Multifocal moderate narrowings are seen at the bilateral M1 and M2 levels. In the posterior circulation there is high-grade narrowing of the non dominant left distal V4 segment. Negative for aneurysm or generalized beading. MRA NECK FINDINGS Normal diameter arch with 3 vessel branching. Mild  atheromatous plaque at the bilateral carotid bulb. No carotid stenosis or ulceration. No proximal subclavian stenosis. The vertebral arteries are smoothly contoured and diffusely patent to the level of the dura. IMPRESSION: Brain MRI: Limited acquisition shows no progression of acute left medulla infarcts. Intracranial MRA: High-grade narrowing of the non dominant left V4 segment. Unremarkable left cerebellar branches. Moderate atheromatous narrowing of bilateral MCA. Neck MRA: Cervical carotid bifurcation atherosclerosis. No flow limiting stenosis of major arteries in the neck. Electronically Signed   By: Jorje Guild M.D.   On: 03/25/2022 11:06  ? ?MR ANGIO NECK WO CONTRAST ? ?Result Date: 03/25/2022 ?CLINICAL DATA:  Stroke follow-up EXAM: MRI HEAD WITHOUT CONTRAST MRA HEAD WITHOUT CONTRAST MRA NECK WITHOUT CONTRAST TECHNIQUE: Multiplanar, multiecho pulse sequences of the brain and surrounding structures were obtained without intravenous contrast. Angiographic images of the Circle of Willis were obtained using MRA technique without intravenous contrast. Angiographic images of the neck were obtained using MRA technique without intravenous contrast. Carotid stenosis measurements (when applicable) are obtained utilizing NASCET criteria, using the distal internal carotid diameter as the denominator. COMPARISON:  Brain MRI from 2 days ago FINDINGS: MRI HEAD FINDINGS Only diffusion and gradient imaging was acquired per request. 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 No new area of infarction and no acute hemorrhage. No hydrocephalus or shift. MRA HEAD FINDINGS Atheromatous irregularity of the carotid siphons and bilateral MCA vessels. Multifocal moderate narrowings are seen at the bilateral M1 and M2 levels. In the posterior circulation there is high-grade narrowing of the non dominant left distal V4 segment. Negative for aneurysm or generalized beading. MRA NECK FINDINGS Normal diameter arch  with 3 vessel branching. Mild atheromatous plaque at the bilateral carotid bulb. No carotid stenosis or ulceration. No proximal subclavian stenosis. The vertebral arteries are smoothly contoured and diffusely patent to the level of the dura. IMPRESSION: Brain MRI: Limited acquisition shows no progression of acute left medulla infarcts. Intracranial MRA: High-grade narrowing of the non dominant left V4 segment. Unremarkable left cerebellar branches. Moderate atheromatous narrowing of bilateral MCA. Neck MRA: Cervical carotid bifurcation atherosclerosis. No flow limiting stenosis of major arteries in the neck. Electronically Signed   By: Jorje Guild M.D.   On: 03/25/2022 11:06  ? ?MR BRAIN WO CONTRAST ? ?Result Date: 03/25/2022 ?CLINICAL DATA:  Stroke follow-up EXAM: MRI HEAD WITHOUT CONTRAST MRA HEAD WITHOUT CONTRAST MRA NECK WITHOUT CONTRAST TECHNIQUE: Multiplanar, multiecho pulse sequences of the brain and surrounding structures were obtained without intravenous contrast. Angiographic images of the Circle of Willis were obtained using MRA technique without intravenous contrast. Angiographic images of the neck were obtained using MRA technique without intravenous contrast. Carotid stenosis measurements (when applicable) are obtained utilizing NASCET criteria, using the distal internal carotid diameter as the denominator. COMPARISON:  Brain MRI from 2 days ago FINDINGS: MRI HEAD FINDINGS Only diffusion and gradient imaging was acquired per request. 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 No new area of infarction and no acute hemorrhage. No hydrocephalus or shift. MRA HEAD FINDINGS Atheromatous irregularity of the  carotid siphons and bilateral MCA vessels. Multifocal moderate narrowings are seen at the bilateral M1 and M2 levels. In the posterior circulation there is high-grade narrowing of the non dominant left distal V4 segment. Negative for aneurysm or generalized beading. MRA NECK FINDINGS  Normal diameter arch with 3 vessel branching. Mild atheromatous plaque at the bilateral carotid bulb. No carotid stenosis or ulceration. No proximal subclavian stenosis. The vertebral arteries are smoothly co

## 2022-03-26 NOTE — Procedures (Signed)
Cardiopulmonary Resuscitation Note ? ?Aalayah Riles  ?[id number]  ?10/25/1951 ? ?Date:03/26/22  ?Time:12:44 PM  ? ?Provider Performing:Anoushka Divito  ? ?Procedure: Cardiopulmonary Resuscitation [phone]) ? ?Indication(s) ?Loss of Pulse ? ?Consent ?N/A ? ?Anesthesia ?N/A ? ? ?Time Out ?N/A ? ? ?Sterile Technique ?Hand hygiene, gloves ? ? ?Procedure Description ?Called to patient's room for CODE BLUE. Initial rhythm was PEA/Asystole. Patient received high quality chest compressions for 2 minutes with defibrillation or cardioversion when appropriate. Epinephrine was administered every 3 minutes as directed by time Therapist, nutritional. Additional procedural interventions include intra-osseus line and intubation.  Return of spontaneous circulation was achieved. ? ?Family at bedside. ? ? ?Complications/Tolerance ?N/A ? ? ?EBL ?N/A ? ? ?Specimen(s) ?N/A ? ?Estimated time to ROSC: 36mnutes ? ?

## 2022-03-26 NOTE — Procedures (Signed)
Intraosseous Needle Insertion Procedure Note  ? ? ?Date:03/26/22  ?Time:12:46 PM  ? ?Provider Performing:Kadasia Kassing  ? ?Procedure: Insertion Intraosseous [phone]) ? ?Indication(s) ?Medication administration ? ?Consent ?Unable to obtain consent due to emergent nature of procedure. ? ?Timeout ?Verified patient identification, verified procedure, site/side was marked, verified correct patient position, special equipment/implants available, medications/allergies/relevant history reviewed, required imaging and test results available. ? ?Procedure Description ?Area of needle insertion was cleaned with chlorhexidine. Intraosseous needle was placed into the left tibia. Bone marrow was aspirated and site easily flushed. The needle was secured in place and dressing applied. ? ?Complications/Tolerance ?None; patient tolerated the procedure well. ? ?EBL ?Minimal ? ?

## 2022-03-26 NOTE — Procedures (Signed)
Intubation Procedure Note ? ?[name]  ?[id number]  ?Jun 30, 1951 ? ?Date:03/26/22  ?Time:12:46 PM  ? ?Provider Performing:Brynlei Klausner  ? ? ?Procedure: Intubation (92010) ? ?Indication(s) ?Respiratory Failure ? ?Consent ?Unable to obtain consent due to emergent nature of procedure. ? ? ?Anesthesia ?Etomidate and Rocuronium ? ? ?Time Out ?Verified patient identification, verified procedure, site/side was marked, verified correct patient position, special equipment/implants available, medications/allergies/relevant history reviewed, required imaging and test results available. ? ? ?Sterile Technique ?Usual hand hygeine, masks, and gloves were used ? ? ?Procedure Description ?Patient positioned in bed supine.  Sedation given as noted above.  Patient was intubated with endotracheal tube using  ,MAC 4 .  View was Grade 1 full glottis .  Number of attempts was 1.  Colorimetric CO2 detector was consistent with tracheal placement. ? ? ?Complications/Tolerance ?None; patient tolerated the procedure well. ?Chest X-ray is ordered to verify placement. ? ? ?EBL ?Minimal ? ? ?Specimen(s) ?None ? ?

## 2022-03-26 NOTE — Plan of Care (Signed)
?  Problem: Education: ?Goal: Knowledge of secondary prevention will improve (SELECT ALL) ?Outcome: Progressing ?Goal: Knowledge of patient specific risk factors will improve (INDIVIDUALIZE FOR PATIENT) ?Outcome: Progressing ?  ?Problem: Ischemic Stroke/TIA Tissue Perfusion: ?Goal: Complications of ischemic stroke/TIA will be minimized ?Outcome: Progressing ?  ?Problem: Safety: ?Goal: Non-violent Restraint(s) ?Outcome: Progressing ?  ?

## 2022-03-26 NOTE — Plan of Care (Signed)
?  Problem: Metabolic: ?Goal: Ability to maintain appropriate glucose levels will improve ?Outcome: Not Progressing ?  ? ?CCM aware of increasing glucose levels. DC d5NS, start LR '@100'$ .  ?

## 2022-03-27 DIAGNOSIS — N1832 Chronic kidney disease, stage 3b: Secondary | ICD-10-CM | POA: Diagnosis not present

## 2022-03-27 DIAGNOSIS — R739 Hyperglycemia, unspecified: Secondary | ICD-10-CM

## 2022-03-27 DIAGNOSIS — R1312 Dysphagia, oropharyngeal phase: Secondary | ICD-10-CM

## 2022-03-27 DIAGNOSIS — E782 Mixed hyperlipidemia: Secondary | ICD-10-CM | POA: Diagnosis not present

## 2022-03-27 DIAGNOSIS — I63212 Cerebral infarction due to unspecified occlusion or stenosis of left vertebral arteries: Secondary | ICD-10-CM | POA: Diagnosis not present

## 2022-03-27 DIAGNOSIS — I2510 Atherosclerotic heart disease of native coronary artery without angina pectoris: Secondary | ICD-10-CM | POA: Diagnosis not present

## 2022-03-27 DIAGNOSIS — E1159 Type 2 diabetes mellitus with other circulatory complications: Secondary | ICD-10-CM | POA: Diagnosis not present

## 2022-03-27 DIAGNOSIS — J9621 Acute and chronic respiratory failure with hypoxia: Secondary | ICD-10-CM

## 2022-03-27 DIAGNOSIS — Z9911 Dependence on respirator [ventilator] status: Secondary | ICD-10-CM | POA: Diagnosis not present

## 2022-03-27 DIAGNOSIS — E038 Other specified hypothyroidism: Secondary | ICD-10-CM | POA: Diagnosis not present

## 2022-03-27 DIAGNOSIS — J9601 Acute respiratory failure with hypoxia: Secondary | ICD-10-CM | POA: Diagnosis not present

## 2022-03-27 LAB — GLUCOSE, CAPILLARY
Glucose-Capillary: 133 mg/dL — ABNORMAL HIGH (ref 70–99)
Glucose-Capillary: 188 mg/dL — ABNORMAL HIGH (ref 70–99)
Glucose-Capillary: 179 mg/dL — ABNORMAL HIGH (ref 70–99)
Glucose-Capillary: 177 mg/dL — ABNORMAL HIGH (ref 70–99)
Glucose-Capillary: 194 mg/dL — ABNORMAL HIGH (ref 70–99)
Glucose-Capillary: 149 mg/dL — ABNORMAL HIGH (ref 70–99)

## 2022-03-27 MED ORDER — HYDRALAZINE HCL 20 MG/ML IJ SOLN
10.0000 mg | Freq: Four times a day (QID) | INTRAMUSCULAR | Status: DC | PRN
  Administered 2022-03-27 – 2022-04-09 (×10): 10 mg via INTRAVENOUS
  Filled 2022-03-27 (×12): qty 1

## 2022-03-27 MED ORDER — LEVOTHYROXINE SODIUM 88 MCG PO TABS
88.0000 ug | ORAL_TABLET | Freq: Every day | ORAL | Status: DC
  Administered 2022-03-28 – 2022-04-18 (×20): 88 ug
  Filled 2022-03-27 (×22): qty 1

## 2022-03-27 MED ORDER — ASPIRIN 325 MG PO TABS
325.0000 mg | ORAL_TABLET | Freq: Every day | ORAL | Status: DC
  Administered 2022-03-27 – 2022-04-02 (×7): 325 mg
  Filled 2022-03-27 (×7): qty 1

## 2022-03-27 MED ORDER — ENOXAPARIN SODIUM 30 MG/0.3ML IJ SOSY
30.0000 mg | PREFILLED_SYRINGE | INTRAMUSCULAR | Status: DC
  Administered 2022-03-27 – 2022-04-05 (×9): 30 mg via SUBCUTANEOUS
  Filled 2022-03-27 (×10): qty 0.3

## 2022-03-27 MED ORDER — BISACODYL 10 MG RE SUPP
10.0000 mg | Freq: Once | RECTAL | Status: AC
  Administered 2022-03-27: 10 mg via RECTAL
  Filled 2022-03-27: qty 1

## 2022-03-27 MED ORDER — ORAL CARE MOUTH RINSE
15.0000 mL | OROMUCOSAL | Status: DC
  Administered 2022-03-27 – 2022-04-18 (×163): 15 mL via OROMUCOSAL

## 2022-03-27 MED ORDER — SENNA 8.6 MG PO TABS
1.0000 | ORAL_TABLET | Freq: Every day | ORAL | Status: DC
  Filled 2022-03-27: qty 1

## 2022-03-27 MED ORDER — VITAL HIGH PROTEIN PO LIQD
1000.0000 mL | ORAL | Status: DC
  Administered 2022-03-27: 1000 mL

## 2022-03-27 MED ORDER — VITAL HIGH PROTEIN PO LIQD: 1000.0000 mL | ORAL | Status: DC

## 2022-03-27 MED ORDER — VITAL HIGH PROTEIN PO LIQD
1000.0000 mL | ORAL | Status: AC
  Administered 2022-03-27: 1000 mL

## 2022-03-27 MED ORDER — JEVITY 1.5 CAL/FIBER PO LIQD
1000.0000 mL | ORAL | Status: DC
  Administered 2022-03-28 – 2022-04-13 (×10): 1000 mL
  Filled 2022-03-27 (×22): qty 1000

## 2022-03-27 MED ORDER — INSULIN ASPART 100 UNIT/ML IJ SOLN
2.0000 [IU] | INTRAMUSCULAR | Status: DC
  Administered 2022-03-27 – 2022-03-28 (×5): 2 [IU] via SUBCUTANEOUS

## 2022-03-27 MED ORDER — CHLORHEXIDINE GLUCONATE 0.12% ORAL RINSE (MEDLINE KIT): 15.0000 mL | Freq: Two times a day (BID) | OROMUCOSAL | Status: DC

## 2022-03-27 MED ORDER — SENNA 8.6 MG PO TABS
1.0000 | ORAL_TABLET | Freq: Every day | ORAL | Status: DC
  Administered 2022-03-27: 8.6 mg via ORAL
  Filled 2022-03-27: qty 1

## 2022-03-27 MED ORDER — INSULIN ASPART 100 UNIT/ML IJ SOLN
2.0000 [IU] | INTRAMUSCULAR | Status: DC
  Administered 2022-03-27: 2 [IU] via SUBCUTANEOUS

## 2022-03-27 MED ORDER — ROSUVASTATIN CALCIUM 5 MG PO TABS
10.0000 mg | ORAL_TABLET | Freq: Every day | ORAL | Status: DC
  Administered 2022-03-27 – 2022-04-18 (×20): 10 mg
  Filled 2022-03-27 (×20): qty 2

## 2022-03-27 MED ORDER — GEMFIBROZIL 600 MG PO TABS
600.0000 mg | ORAL_TABLET | Freq: Two times a day (BID) | ORAL | Status: DC
  Administered 2022-03-27 – 2022-04-18 (×38): 600 mg
  Filled 2022-03-27 (×48): qty 1

## 2022-03-27 MED ORDER — ASPIRIN 325 MG PO TABS: 325.0000 mg | ORAL_TABLET | Freq: Every day | ORAL | Status: DC

## 2022-03-27 MED ORDER — PROSOURCE TF PO LIQD
45.0000 mL | Freq: Two times a day (BID) | ORAL | Status: DC
  Administered 2022-03-27 – 2022-04-18 (×39): 45 mL
  Filled 2022-03-27 (×39): qty 45

## 2022-03-27 MED ORDER — LABETALOL HCL 5 MG/ML IV SOLN
5.0000 mg | INTRAVENOUS | Status: DC | PRN
Start: 2022-03-27 — End: 2022-04-05

## 2022-03-27 MED ORDER — FAMOTIDINE 40 MG/5ML PO SUSR
20.0000 mg | ORAL | Status: DC
Start: 2022-03-27 — End: 2022-04-18
  Administered 2022-03-27 – 2022-04-18 (×20): 20 mg
  Filled 2022-03-27 (×23): qty 2.5

## 2022-03-27 NOTE — Progress Notes (Signed)
Inpatient Rehabilitation Admissions Coordinator  ? ?Rehab consult received. I will follow up once extubated. ? ?Danne Baxter, RN, MSN ?Rehab Admissions Coordinator ?[phone] ?03/27/2022 3:05 PM ? ?

## 2022-03-27 NOTE — Progress Notes (Addendum)
STROKE TEAM PROGRESS NOTE  ? ?INTERVAL HISTORY ?Patient is seen in her room with her sister at the bedside.  Yesterday, she developed respiratory distress and required emergent intubation.  She is doing well on the ventilator at this time without sedation and is alert and able to follow commands.  May consider temporary tracheostomy and PEG tube. ? ?Vitals:  ? 03/27/22 0545 03/27/22 0600 03/27/22 0745 03/27/22 0800  ?BP: (!) 186/75 (!) 171/71 (!) 141/57   ?Pulse: 87 83 94   ?Resp: 20 20    ?Temp:    97.9 ?F (36.6 ?C)  ?TempSrc:    Axillary  ?SpO2: 98% 97% 100%   ?Weight:      ?Height:      ? ?CBC:  ?Recent Labs  ?Lab 03/24/22 ?2341 03/25/22 ?0157 03/26/22 ?1215  ?WBC 6.4 6.8  --   ?HGB 11.6* 11.8* 10.5*  ?HCT 34.0* 36.0 31.0*  ?MCV 85.6 87.0  --   ?PLT 307 327  --   ? ? ?Basic Metabolic Panel:  ?Recent Labs  ?Lab 03/25/22 ?0157 03/26/22 ?0253 03/26/22 ?1215  ?NA 134* 138 140  ?K 3.7 4.1 3.7  ?CL 100 103  --   ?CO2 23 25  --   ?GLUCOSE 266* 176*  --   ?BUN 33* 46*  --   ?CREATININE 1.46* 1.99*  --   ?CALCIUM 10.1 9.3  --   ?MG 2.1  --   --   ? ? ?Lipid Panel:  ?Recent Labs  ?Lab 03/24/22 ?0214  ?CHOL 219*  ?TRIG 182*  ?HDL 41  ?CHOLHDL 5.3  ?VLDL 36  ?LDLCALC 142*  ? ? ?HgbA1c:  ?Recent Labs  ?Lab 03/24/22 ?0214  ?HGBA1C 9.5*  ? ? ?Urine Drug Screen: No results for input(s): LABOPIA, COCAINSCRNUR, LABBENZ, AMPHETMU, THCU, LABBARB in the last 168 hours.  ?Alcohol Level No results for input(s): ETH in the last 168 hours. ? ?IMAGING past 24 hours ?DG CHEST PORT 1 VIEW ? ?Addendum Date: 03/26/2022   ?ADDENDUM REPORT: 03/26/2022 12:52 ADDENDUM: PRA has attempted to page covering Dr. for 30 minutes. As such, these results will be called to the ordering clinician or representative by the Radiologist Assistant, and communication documented in the PACS or [employer]. Electronically Signed   By: Valentino Saxon M.D.   On: 03/26/2022 12:52  ? ?Result Date: 03/26/2022 ?CLINICAL DATA:  [id number]; intubation EXAM:  PORTABLE CHEST 1 VIEW COMPARISON:  Chest radiograph dated Mar 25, 2022 FINDINGS: Evaluation is limited secondary to patient rotation. The cardiomediastinal silhouette is unchanged in contour.ETT tube tip projects over the RIGHT mainstem bronchus. Enteric tube tip and side port project over the stomach. Cardiac loop recorder. Small LEFT pleural effusion. No pneumothorax. LEFT retrocardiac opacity, nonspecific with differential considerations including atelectasis versus infection/aspiration. High density material delineating loops of colon consistent with prior enteric contrast administration. IMPRESSION: ETT tip projects over the region of the RIGHT mainstem bronchus. Recommend repositioning. PRA system was activated 03/26/2022 at 12:22 p.m. at time of interpretation. Addendum will be issued upon telephonic communication. Electronically Signed: By: Valentino Saxon M.D. On: 03/26/2022 12:26   ? ?PHYSICAL EXAM ?General:  Alert, well-developed, well-nourished, intubated elderly lady in no acute distress ?Respiratory:  Respirations synchronous with ventilator ? ?NEURO:  ?Awake alert, able to follow commands and communicate by nodding and gestures.  PERRL  Unable to abduct left eye and cannot raise left eyebrow.  Tongue midline, able to follow commands with all 4 extremities. ? ? ?ASSESSMENT/PLAN ?[name]  is a 71 y.o. 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  MRI reveals a left medullary stroke.  On 5/14, patient was intubated due to respiratory distress. ? 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 Short segmental occlusion ?MRI  Small acute to early subacute infarct in left aspect of the medulla, chronic white matter microangiopathy ?MRA head and neck  high grade narrowing of left V4 segment,  moderate narrowing of bilateral MCA ?Repeat MRI 5/13 stable left medullary infarct ?2D Echo EF 00-93%, grade 1 diastolic dysfunction, no atrial level shunt ?Loop recorder interrogation no A-fib ?LDL 142 ?HgbA1c 9.5 ?VTE prophylaxis - lovenox ?On Plavix and pletal prior to admission, now on aspirin 325 mg per tube.  Will hold off on Brillinta given likely need for trach/PEG.  ?Therapy recommendations:  CIR ?Disposition:  pending ? ?Respiratory failure ?Aspiration  ?Pt choked on saliva  ?Likely aspirated, on unasyn  ?CCM on board ?Emergent intubated 5/14 ? 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  MRI showed right CR infarct.  MRA and carotid Doppler negative.  EF 60 to 65%.  LDL 87, A1c 8.9.  Discharged on Plavix and Pletal and Lopid.  Loop recorder was placed after discharge. ? ?Hypertension ?Home meds:  amlodipine 10 mg daily, losartan-HCTZ 100-25 mg daily ?Stable ?Long-term BP goal normotensive ? ?Hyperlipidemia ?Home meds:  gemfibrozil 600 mg daily, resumed in hospital ?LDL 142, goal < 70 ?High intensity statin not indicated due to previous intolerance ?Consider lipid clinic referral ? ?Diabetes type II Uncontrolled ?Home meds:  lantus insulin 26 units BID ?HgbA1c 9.5, goal < 7.0 ?Now on insulin ?CBGs ?SSI ?Close PCP follow up for better DM control. ? ?Dysphagia ?Status post cortrak -> nosebleed -> cortrak removed ?Has OG tube now ?On TF ?will consider PEG later ? ?Other Stroke Risk Factors ?Advanced Age >/= 32  ?Former cigarette smoker ?Hx stroke ? ?AKI on CKD3 ?Cre 1.38-1.46-1.99 ?CCM on board ?Avoid contrast when possible ?Renally dose medications as appropriate ? ?Other Active Problems ?Hypothyroidism, continue home synthroid ? ?Hospital day # 4 ? ?[place] , MSN, AGACNP-BC ?[facility] ?See Amion for schedule and pager information ?03/27/2022 10:55 AM ?  ?ATTENDING NOTE: ?I reviewed above note and agree with the assessment and plan. Pt was seen and examined.  ? ?Sister at bedside.  Patient emergently intubated yesterday for choking, desaturation and altered mental status with bradycardia before code.  She received CPR and intubation, ROSC in 2 to 3 minutes.  Patient currently intubated, on Precedex, however awake alert, follows simple commands, eyes open tracking, moving all extremities.  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  Neurologically stable from yesterday although PT stated that patient leaning towards more on the left.  Do not feel repeat imaging needed at this time, no change of treatment plan. ? ?Appreciate CCM assistance, continue aspirin 325, hold off Plavix given potential trach and PEG procedure.  Currently on tube feeding, continue Lopid 600.  Continue glycemic control.  Kidney function worsened, consistent with AKI, CCM on board.  Will follow.  ? ?  I had long discussion with sister and patient at bedside, updated pt current condition, treatment plan and potential prognosis, and answered all the questions.  They expressed understanding and appreciation.  ? ?For detailed assessment and plan, please refer to above as I have made changes wherever appropriate.  ? ?[name], MD PhD ?Stroke Neurology ?03/27/2022 ?4:03 PM ? ?This patient is critically ill due to left medullary infarct, severe dysphagia and dysarthria, aspiration and at significant risk of neurological worsening, death form recurrent stroke, hemorrhagic transformation, sepsis, heart failure. This patient's care requires constant monitoring of vital signs, hemodynamics, respiratory and cardiac monitoring, review of multiple databases, neurological assessment, discussion with family, other specialists and medical decision making of high complexity. I spent 40  minutes of neurocritical care time in the care of this patient. ? ? ? ?To contact Stroke Continuity provider, please refer to [email or web address] ?After hours, contact General Neurology  ?

## 2022-03-27 NOTE — Progress Notes (Signed)
SLP Cancellation Note ? ?Patient Details ?Name: [name] ?MRN: [id number] ?DOB: 22-Nov-1950 ? ? ?Cancelled treatment:       Reason Eval/Treat Not Completed: Patient not medically ready. Now intubated. Will sign off for now, please reorder when needed ? ? ?Trannie Bardales, Katherene Ponto ?03/27/2022, 7:46 AM ?

## 2022-03-27 NOTE — Progress Notes (Addendum)
Physical Therapy Treatment ?Patient Details ?Name: [name] ?MRN: [id number] ?DOB: 1950/12/01 ?Today's Date: 03/27/2022 ? ? ?History of Present Illness Pt is a 71 year old woman admitted on 03/23/22 with dizziness, decreased balance and hoarse voice. + stroke L medulla. PMH: CVA, DM@, CKDIII, PVD, HTN, hypothyroidism.  Patient coded and required CPR and intubation on 5/14. ? ?  ?PT Comments  ? ? Patient with cardiac/respiratory arrest needing CPR and intubation now orally intubated, but alert and able to follow commands.  Noted new balance impairment and pushing to L with impaired body/spatial awareness, diplopia.  MD aware and no need for re-imaging.  Patient tolerated about 7 minutes sitting EOB working on balance and tolerance to upright with eventual improvement in nystagmus and fluctuating levels of assist for balance, but needing mod to max A overall.  Patient's sister present and supportive.  Goals downgraded and new balance goals set. Continue to feel she will be appropriate for acute inpatient rehab prior to d/c home.    ?Recommendations for follow up therapy are one component of a multi-disciplinary discharge planning process, led by the attending physician.  Recommendations may be updated based on patient status, additional functional criteria and insurance authorization. ? ?Follow Up Recommendations ? Acute inpatient rehab (3hours/day) ?  ?  ?Assistance Recommended at Discharge Frequent or constant Supervision/Assistance  ?Patient can return home with the following Two people to help with walking and/or transfers;A lot of help with bathing/dressing/bathroom;Assist for transportation;Help with stairs or ramp for entrance ?  ?Equipment Recommendations ? Rolling walker (2 wheels)  ?  ?Recommendations for Other Services   ? ? ?  ?Precautions / Restrictions Precautions ?Precautions: Fall ?Precaution Comments: impulsive; wrist restraints, OG tube, vent  ?  ? ?Mobility ? Bed Mobility ?Overal bed mobility:  Needs Assistance ?Bed Mobility: Supine to Sit ?  ?  ?Supine to sit: HOB elevated, Mod assist, +2 for safety/equipment ?Sit to supine: Mod assist, +2 for safety/equipment ?  ?General bed mobility comments: assist for lifting trunk, balance and for safety with ETT and other lines ?  ? ?Transfers ?  ?  ?  ?  ?  ?  ?  ?  ?  ?General transfer comment: NT due to dizziness, imbalance and multiple lines ?  ? ?Ambulation/Gait ?  ?  ?  ?  ?  ?  ?  ?  ? ? ?Stairs ?  ?  ?  ?  ?  ? ? ?Wheelchair Mobility ?  ? ?Modified Rankin (Stroke Patients Only) ?Modified Rankin (Stroke Patients Only) ?Pre-Morbid Rankin Score: No symptoms ?Modified Rankin: Severe disability ? ? ?  ?Balance Overall balance assessment: Needs assistance ?  ?Sitting balance-Leahy Scale: Zero ?Sitting balance - Comments: mod/max A  pt pushing to L and difficulty with head righting in sitting; able to get to midline some with L eye occlusion due to diplopia, settling vertigo/nystagmus with increased time and with cues and assist for head position.  Patient needing frequent cues not to push with R UE to the L.  Patient sat EOB x 7 minutes with sister in the room and cues for visual target for upright ?  ?  ?  ?  ?  ?  ?  ?  ?  ?  ?  ?  ?  ?  ?  ?  ? ?  ?Cognition Arousal/Alertness: Awake/alert ?Behavior During Therapy: Impulsive ?Overall Cognitive Status: Difficult to assess ?  ?  ?  ?  ?  ?  ?  ?  ?  ?  ?  ?  ?  Following Commands: Follows one step commands consistently ?Safety/Judgement: Decreased awareness of safety ?  ?  ?  ?  ?  ? ?  ?Exercises   ? ?  ?General Comments General comments (skin integrity, edema, etc.): on PRVC mode on vent via ETT 40% FiO2 PEEP 8; VSS BP 163/69 in sitting ?  ?  ? ?Pertinent Vitals/Pain Pain Assessment ?Pain Assessment: No/denies pain  ? ? ?Home Living   ?  ?  ?  ?  ?  ?  ?  ?  ?  ?   ?  ?Prior Function    ?  ?  ?   ? ?PT Goals (current goals can now be found in the care plan section) Acute Rehab PT Goals ?Patient Stated Goal: to get  better ?PT Goal Formulation: With patient/family ?Time For Goal Achievement: 04/08/22 ?Potential to Achieve Goals: Good ?Progress towards PT goals: Not progressing toward goals - comment;Goals downgraded-see care plan ? ?  ?Frequency ? ? ? Min 4X/week ? ? ? ?  ?PT Plan Current plan remains appropriate  ? ? ?Co-evaluation PT/OT/SLP Co-Evaluation/Treatment: Yes ?Reason for Co-Treatment: Necessary to address cognition/behavior during functional activity;Complexity of the patient's impairments (multi-system involvement);To address functional/ADL transfers ?PT goals addressed during session: Mobility/safety with mobility;Balance ?  ?  ? ?  ?AM-PAC PT "6 Clicks" Mobility   ?Outcome Measure ? Help needed turning from your back to your side while in a flat bed without using bedrails?: A Lot ?Help needed moving from lying on your back to sitting on the side of a flat bed without using bedrails?: A Lot ?Help needed moving to and from a bed to a chair (including a wheelchair)?: Total ?Help needed standing up from a chair using your arms (e.g., wheelchair or bedside chair)?: Total ?Help needed to walk in hospital room?: Total ?Help needed climbing 3-5 steps with a railing? : Total ?6 Click Score: 8 ? ?  ?End of Session   ?Activity Tolerance: Patient limited by fatigue ?Patient left: in bed;with call bell/phone within reach;with family/visitor present;with restraints reapplied ?  ?PT Visit Diagnosis: Other symptoms and signs involving the nervous system (R29.898);Dizziness and giddiness (R42);Hemiplegia and hemiparesis;Other abnormalities of gait and mobility (R26.89) ?Hemiplegia - Right/Left: Right ?Hemiplegia - dominant/non-dominant: Dominant ?Hemiplegia - caused by: Cerebral infarction ?  ? ? ?Time: 8119-1478 ?PT Time Calculation (min) (ACUTE ONLY): 25 min ? ?Charges:  $Therapeutic Activity: 8-22 mins          ?          ? ?Magda Kiel, PT ?Acute Rehabilitation  Services ?GNFAO:[phone] ?Office:[phone] ?03/27/2022 ? ? ? ?Reginia Naas ?03/27/2022, 12:54 PM ? ?

## 2022-03-27 NOTE — Progress Notes (Signed)
? ?NAME:  [name], MRN:  [id number], DOB:  1951-07-16, LOS: 4 ?ADMISSION DATE:  03/23/2022, CONSULTATION DATE: 03/25/2022 ?REFERRING MD:  Jonetta Osgood, MD , CHIEF COMPLAINT: Increasing shortness of breath ? ?History of Present Illness:  ?71 year old female with poorly controlled diabetes, hypertension, CKD stage IIIb and prior history of CVA who presented with vertigo and progressive dysphagia, she was noted to have left medullary acute stroke, was admitted to hospital service for further management, stroke team is following.  This afternoon patient started with increasing dysphagia and unable to protect her airway, she required frequent suctioning, she became hypoxic and her oxygen requirement went up, now she is on Ventimask, PCCM was consulted for help evaluation and management ? ?Pertinent  Medical History  ? ?Past Medical History:  ?Diagnosis Date  ? Anemia   ? Arteriosclerotic cardiovascular disease   ? CKD (chronic kidney disease) stage 3, GFR 30-59 ml/min (HCC)   ? Claudication in peripheral vascular disease ([place])   ? CVA (cerebral vascular accident) [facility])   ? 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 GERD (gastroesophageal reflux disease)   ? Hyperlipidemia   ? declines statins  ? Hypertension   ? Hypothyroidism   ? Type 2 diabetes mellitus ([place]) 2000  ? Vitamin D deficiency   ? ? ? ?Significant Hospital Events: ?Including procedures, antibiotic start and stop dates in addition to other pertinent events   ?5/11 admitted ?5/14 intubated for cardiac arrest, likely 2/2 aspiration vs hypoxia ? ?Interim History / Subjective:  ?Awake, complaining of LLQ abd pain. No acute events overnight.  Tmax 100.4 ? ?Objective   ?Blood pressure (!) 171/71, pulse 83, temperature 97.9 ?F (36.6 ?C), temperature source Axillary, resp. rate 20, height '5\' 2"'$  (1.575 m), weight 74.2 kg, SpO2 97 %. ?   ?Vent Mode: PRVC ?FiO2 (%):  [60 %-100 %] 60 % ?Set Rate:  [16 bmp-20 bmp] 20  bmp ?Vt Set:  [400 mL] 400 mL ?PEEP:  [5 cmH20] 5 cmH20 ?Plateau Pressure:  [16 cmH20-18 cmH20] 16 cmH20  ? ?Intake/Output Summary (Last 24 hours) at 03/27/2022 0848 ?Last data filed at 03/27/2022 0600 ?Gross per 24 hour  ?Intake 1923.87 ml  ?Output 1300 ml  ?Net 623.87 ml  ? ? ?Filed Weights  ? 03/23/22 0736 03/25/22 0141 03/27/22 0500  ?Weight: 72.6 kg 74.9 kg 74.2 kg  ? ? ?Examination: ?  ?Physical exam: ?General: critically ill appearing woman lying in bed in NAD, intubated, lightly sedated ?HEENT: [place]/AT, eyes anicteric ?Neuro: easily arousable, RASS -1, follows commands in extremities. Nods to answer questions.  ?Chest: no rhonchi, thick yellow ETT secretions. ?Heart: S1S2, RRR ?Abdomen: mild TTP LLQ, no guarding ?Skin: warm, dry, no rashes ? ?Resolved Hospital Problem list   ?Epistaxis ? ?Assessment & Plan:  ?Acute left medullary stroke ?Progressive dysphagia and dysphonia due to stroke ?-aspirin, statin; was on plavid PTA- can discuss with neurology if resuming is indicated  ?-PT, OT reconsulted ?-needs SLP consult after extubation ?-tele monitoring ?-aggressively treat fevers ? ?Acute hypoxic respiratory failure due to aspiration pneumonia requiring MV ?-LTVV ?-VAP prevention protocol ?-PAD protocol- doing well with precedex ?-daily SAT & SBT as appropriate; weaning FiO2 ?-may need trach if not able to control her secretions; discussed this with patient, daughter, and patient's sister today ?-trach aspirate culture ?-antibiotics ? ?Abdominal pain due to constipation ?-dulcolax ?-daily senna and miralax ? ?Hypertension ?-labetalol  & hydralazine PRN for sustained SBP >180 ?-holding PTA  amlodipine, losartan- HCTZ ? ?Hyperlipidemia ?-trial of high intensity statin- crestor '10mg'$  daily. Has previous history of intolerance with rash and GI symptoms. Monitor for signs of intolerance ?-resume PTA gemfibrozil ? ?Poorly controlled diabetes with hyperglycemia ?-PTA on lantus 26 units BID, not on short-acting insulin>  here on 12 units daily + SSI  ?-adding TF coverage 2 units Q4h with hold parameters ?-goal BG 140-180 ? ?AKI on CKD stage IIIb ?-renally dose meds, avoid nephrotoxic meds ?-strict I/Os ?-monitor ? ?Hypothyroidism ?-synthroid ? ?At risk of malnutrition ?-TF ? ?Epistaxis ?-cortrak removed ?-afrin PRN ?-resume chemical DVT prophylaxis ? ?Best Practice (right click and "Reselect all SmartList Selections" daily)  ? ?Diet/type: tubefeeds ?DVT prophylaxis: LMWH ?GI prophylaxis: H2B ?Lines: N/A ?Foley:  N/A ?Code Status:  full code ?Last date of multidisciplinary goals of care discussion [5/15, goals of care discussions are ongoing, updated patient, sister at bedside, daughter via phone during rounds. ? ?Labs   ?CBC: ?Recent Labs  ?Lab 03/23/22 ?0738 03/24/22 ?2341 03/25/22 ?0157 03/26/22 ?1215  ?WBC 6.3 6.4 6.8  --   ?HGB 11.7* 11.6* 11.8* 10.5*  ?HCT 36.2 34.0* 36.0 31.0*  ?MCV 88.7 85.6 87.0  --   ?PLT 314 307 327  --   ? ? ? ?Basic Metabolic Panel: ?Recent Labs  ?Lab 03/23/22 ?0738 03/24/22 ?2341 03/25/22 ?0157 03/26/22 ?0253 03/26/22 ?1215  ?NA 136 132* 134* 138 140  ?K 4.1 3.6 3.7 4.1 3.7  ?CL 102 99 100 103  --   ?CO2 '23 22 23 25  '$ --   ?GLUCOSE 382* 314* 266* 176*  --   ?BUN 27* 36* 33* 46*  --   ?CREATININE 1.41* 1.38* 1.46* 1.99*  --   ?CALCIUM 9.9 10.1 10.1 9.3  --   ?MG  --   --  2.1  --   --   ? ? ?GFR: ?Estimated Creatinine Clearance: 24.8 mL/min (A) (by C-G formula based on SCr of 1.99 mg/dL (H)). ?Recent Labs  ?Lab 03/23/22 ?0738 03/24/22 ?2341 03/25/22 ?0157  ?WBC 6.3 6.4 6.8  ? ? ? ? ? ? ?Critical care time:  ?  ? ?This patient is critically ill with multiple organ system failure which requires frequent high complexity decision making, assessment, support, evaluation, and titration of therapies. This was completed through the application of advanced monitoring technologies and extensive interpretation of multiple databases. During this encounter critical care time was devoted to patient care services  described in this note for 30mnutes. ? ?LJulian Hy DO 03/27/22 9:09 AM ?[facility] ? ? ? ?

## 2022-03-27 NOTE — Progress Notes (Signed)
eLink Physician-Brief Progress Note ?Patient Name: Zenita Kister ?DOB: [birth year] ?MRN: [id number] ? ? ?Date of Service ? 03/27/2022  ?HPI/Events of Note ? Notified of hypertension.  ? ?71/F admitted with medullary stroke on 03/23/22, transferred to the ICU on 03/25/22, subsequently requiring intubation on 03/26/22.  She is comfortable on the vent and denies any pain.  RN reportes SBP in the 180s.  She is not on any sedation.  ? ?Pt appears calm on the vent. BP 186/75, HR 87.   ?eICU Interventions ? Hydralazine IV PRN ordered.   ? ? ? ?Intervention Category ?Intermediate Interventions: Hypertension - evaluation and management ? ?Elsie Lincoln ?03/27/2022, 5:56 AM ?

## 2022-03-27 NOTE — Evaluation (Signed)
Occupational Therapy Re-Evaluation ?Patient Details ?Name: [name] ?MRN: [id number] ?DOB: 07/23/1951 ?Today's Date: 03/27/2022 ? ? ?History of Present Illness Pt is a 71 year old woman admitted on 03/23/22 with dizziness, decreased balance and hoarse voice. + stroke L medulla. Patient coded and required CPR and intubation on 5/14. PMH: CVA, DM@, CKDIII, PVD, HTN, hypothyroidism.  ? ?Clinical Impression ?  ?[name] was re-evaluated s/p the above cardiac event with need to re-intubate. She now requires mod+2 for bed mobility and mod-max for sitting balance, she pushes with RUE causing severe L lateral lean. 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 Due to impairments listed below she now requires min-max+2 for all ADLs. Due to DV and nystagmus, recommending pt would do will with L eye patch over occluded glasses, MD and RN made aware and agreeable. OT to continue to follow. Recommending AIR at d/c.  ?   ? ?Recommendations for follow up therapy are one component of a multi-disciplinary discharge planning process, led by the attending physician.  Recommendations may be updated based on patient status, additional functional criteria and insurance authorization.  ? ?Follow Up Recommendations ? Acute inpatient rehab (3hours/day)  ?  ?Assistance Recommended at Discharge Frequent or constant Supervision/Assistance  ?Patient can return home with the following Help with stairs or ramp for entrance;A lot of help with walking and/or transfers;A lot of help with bathing/dressing/bathroom;Assistance with cooking/housework;Assistance with feeding;Direct supervision/assist for medications management;Direct supervision/assist for financial management;Assist for transportation ? ?  ?   ?Equipment Recommendations ? Other (comment) (pending progress)  ?  ?   ?Precautions / Restrictions Precautions ?Precautions: Fall ?Precaution Comments: impulsive; wrist restraints, OG tube,  vent ?Restrictions ?Weight Bearing Restrictions: No  ? ?  ? ?Mobility Bed Mobility ?Overal bed mobility: Needs Assistance ?Bed Mobility: Supine to Sit ?  ?  ?Supine to sit: HOB elevated, Mod assist, +2 for safety/equipment ?Sit to supine: Mod assist, +2 for safety/equipment ?  ?General bed mobility comments: assist for lifting trunk, balance and for safety with ETT and other lines ?  ? ?Transfers ?Overall transfer level: Needs assistance ?  ?  ?  ?  ?  ?  ?  ?  ?General transfer comment: NT due to dizziness, imbalance and multiple lines ?  ? ?  ?Balance Overall balance assessment: Needs assistance ?  ?Sitting balance-Leahy Scale: Zero ?Sitting balance - Comments: mod/max A  pt pushing to L and difficulty with head righting in sitting; able to get to midline some with L eye occlusion due to diplopia, settling vertigo/nystagmus with increased time and with cues and assist for head position.  Patient needing frequent cues not to push with R UE to the L.  Patient sat EOB x 7 minutes with sister in the room and cues for visual target for upright ?  ?  ?  ?   ? ?ADL either performed or assessed with clinical judgement  ? ?ADL Overall ADL's : Needs assistance/impaired ?Eating/Feeding: NPO ?  ?Grooming: Moderate assistance;Sitting ?  ?Upper Body Bathing: Minimal assistance;Sitting ?  ?Lower Body Bathing: Maximal assistance;+2 for physical assistance;+2 for safety/equipment ?  ?Upper Body Dressing : Moderate assistance;Sitting ?  ?Lower Body Dressing: Maximal assistance;+2 for safety/equipment;+2 for physical assistance ?  ?Toilet Transfer: Moderate assistance;+2 for physical assistance;+2 for safety/equipment;Stand-pivot ?  ?Toileting- Clothing Manipulation and Hygiene: Moderate assistance;+2 for physical assistance;+2 for safety/equipment ?  ?  ?  ?  ?General ADL Comments: impaired cognition, pushes toward L  with R, nystagmus, poor balance, difficulty command following  ? ? ? ?Vision Baseline Vision/History: 0 No visual  deficits ?Ability to See in Adequate Light: 1 Impaired ?Patient Visual Report: Diplopia ?Vision Assessment?: Vision impaired- to be further tested in functional context ?Additional Comments: DV reported and resolves with L eye occluded. R beating nystagmus noted throughout.  ?   ?Perception Perception ?Perception: Not tested ?  ?Praxis Praxis ?Praxis: Not tested ?  ? ?Pertinent Vitals/Pain Pain Assessment ?Pain Assessment: Faces ?Faces Pain Scale: No hurt ?Pain Intervention(s): Limited activity within patient's tolerance, Monitored during session  ? ? ? ?Hand Dominance Right ?  ?Extremity/Trunk Assessment Upper Extremity Assessment ?Upper Extremity Assessment: RUE deficits/detail;LUE deficits/detail (needs to be further evaluated. Moving all extremeties against gravity.) ?  ?Lower Extremity Assessment ?Lower Extremity Assessment: Defer to PT evaluation ?  ?Cervical / Trunk Assessment ?Cervical / Trunk Assessment: Normal ?  ?Communication Communication ?Communication: Expressive difficulties (intubated) ?  ?Cognition Arousal/Alertness: Awake/alert ?Behavior During Therapy: Impulsive ?Overall Cognitive Status: Difficult to assess ?  ?  ?  ?  ?  ?  ?General Comments: following some commands, seemingly shaking head to yes/no approriately most of the time. impulsive with mobility ?  ?  ?General Comments  on PRVC mode on vent via ETT 40% FiO2 PEEP 8; VSS BP 163/69 in sitting ? ?  ?   ?   ? ? ?Home Living Family/patient expects to be discharged to:: Private residence ?Living Arrangements: Alone ?Available Help at Discharge: Family;Available PRN/intermittently ?Type of Home: House ?Home Access: Stairs to enter ?Entrance Stairs-Number of Steps: 3+5 ?Entrance Stairs-Rails: Right;Left;Can reach both ?Home Layout: One level ?  ?  ?Bathroom Shower/Tub: Tub/shower unit ?  ?Bathroom Toilet: Handicapped height ?  ?  ?Home Equipment: Conservation officer, nature (2 wheels);Cane - single point ?  ?  ? Lives With: Alone ? ?  ?Prior  Functioning/Environment Prior Level of Function : Independent/Modified Independent;Driving ?  ?  ?  ?  ?  ?  ?  ?  ?  ? ?  ?  ?   ?   ?   ?OT Goals(Current goals can be found in the care plan section) Acute Rehab OT Goals ?OT Goal Formulation: With patient ?Time For Goal Achievement: 04/07/22 ?Potential to Achieve Goals: Good ?ADL Goals ?Pt Will Perform Grooming: with supervision;standing ?Pt Will Perform Lower Body Bathing: with supervision;sit to/from stand ?Pt Will Perform Lower Body Dressing: with supervision;sit to/from stand ?Pt Will Transfer to Toilet: with supervision;ambulating;regular height toilet ?Pt Will Perform Toileting - Clothing Manipulation and hygiene: with supervision;sit to/from stand ?Additional ADL Goal #1: Pt will complete pill box test.  ?OT Frequency: Min 3X/week ?  ? ?Co-evaluation PT/OT/SLP Co-Evaluation/Treatment: Yes ?Reason for Co-Treatment: Complexity of the patient's impairments (multi-system involvement);For patient/therapist safety;To address functional/ADL transfers ?PT goals addressed during session: Mobility/safety with mobility;Balance ?OT goals addressed during session: ADL's and self-care ?  ? ?  ?AM-PAC OT "6 Clicks" Daily Activity     ?Outcome Measure Help from another person eating meals?: Total ?Help from another person taking care of personal grooming?: A Little ?Help from another person toileting, which includes using toliet, bedpan, or urinal?: A Little ?Help from another person bathing (including washing, rinsing, drying)?: A Little ?Help from another person to put on and taking off regular upper body clothing?: None ?Help from another person to put on and taking off regular lower body clothing?: A Little ?6 Click Score: 17 ?  ?End of Session Nurse Communication: Mobility status (need to  patch L eye) ? ?Activity Tolerance: Patient tolerated treatment well ?Patient left: in bed;with call bell/phone within reach;with bed alarm set ? ?OT Visit Diagnosis: Unsteadiness on  feet (R26.81);Dizziness and giddiness (R42)  ?              ?Time: 9509-3267 ?OT Time Calculation (min): 24 min ?Charges:  OT General Charges ?$OT Visit: 1 Visit ?OT Evaluation ?$OT Eval Moderate Complexity: 1 Mod ? ? ?Aldona Bar

## 2022-03-27 NOTE — Progress Notes (Signed)
Nutrition Follow-up ? ?DOCUMENTATION CODES:  ? ?Not applicable ? ?INTERVENTION:  ? ?Start Jevity 1.5 @ 50 mL/hr (1200 mL/day) ?45 mL ProSource TF - BID ? ?Provides 1880 kcal, 99 gm of protein, 912 mL free water   ? ? ?NUTRITION DIAGNOSIS:  ? ?Inadequate oral intake related to inability to eat as evidenced by NPO status. ?Ongoing.  ? ?GOAL:  ? ?Patient will meet greater than or equal to 90% of their needs ?Progressing with TF.  ? ?MONITOR:  ? ?Diet advancement, Labs, Weight trends, TF tolerance ? ?REASON FOR ASSESSMENT:  ? ?Consult ?Enteral/tube feeding initiation and management ? ?ASSESSMENT:  ? ?71 y.o. female presented to the ED with weakness and lightheadedness. PMH includes CVA, T2DM, HTN, CKD IIIb, and GERD. Pt admitted with CVA. ? ?Pt discussed during ICU rounds and with RN and MD.  ?Per neuro plans to discuss trach/PEG. Per CCM no cortrak due to bleeding.  ? ?5/11 admitted ?5/12 s/p cortrak placement later removed due to nose bleed ?5/13 tx to ICU ?5/14 developed respiratory distress due to aspiration and required emergent intubation.   ? ? ?Medications reviewed and include: colace, pepcid, SSI, novolog, semglee, synthroid, miralax senokot  ? ?Labs reviewed: TG 182 ?A1C: 9.5 (5/12) ?CBG's: 133-339 ? ?OG tube; looped in stomach  ? ?Current TF:  ?Vital High Protein @ 40 ml/hr with 45 ml ProSource TF BID ?Provides: 1040 kcal and 106 grams protein ? ?Diet Order:   ?Diet Order   ? ?       ?  Diet NPO time specified Except for: Ice Chips  Diet effective now       ?  ? ?  ?  ? ?  ? ? ?EDUCATION NEEDS:  ? ?No education needs have been identified at this time ? ?Skin:  Skin Assessment: Reviewed RN Assessment ? ?Last BM:  5/10 ? ?Height:  ? ?Ht Readings from Last 1 Encounters:  ?03/23/22 '5\' 2"'$  (1.575 m)  ? ? ?Weight:  ? ?Wt Readings from Last 1 Encounters:  ?03/27/22 74.2 kg  ? ? ?Ideal Body Weight:  50 kg ? ?BMI:  Body mass index is 29.92 kg/m?. ? ?Estimated Nutritional Needs:  ? ?Kcal:  1800-2000 ? ?Protein:  90-105  grams ? ?Fluid:  >/= 1.8 L ? ?Lockie Pares., RD, LDN, CNSC ?See AMiON for contact information  ? ?

## 2022-03-28 DIAGNOSIS — E038 Other specified hypothyroidism: Secondary | ICD-10-CM | POA: Diagnosis not present

## 2022-03-28 DIAGNOSIS — I10 Essential (primary) hypertension: Secondary | ICD-10-CM | POA: Diagnosis not present

## 2022-03-28 DIAGNOSIS — E782 Mixed hyperlipidemia: Secondary | ICD-10-CM | POA: Diagnosis not present

## 2022-03-28 DIAGNOSIS — I63212 Cerebral infarction due to unspecified occlusion or stenosis of left vertebral arteries: Secondary | ICD-10-CM | POA: Diagnosis not present

## 2022-03-28 DIAGNOSIS — E1159 Type 2 diabetes mellitus with other circulatory complications: Secondary | ICD-10-CM | POA: Diagnosis not present

## 2022-03-28 DIAGNOSIS — Z9911 Dependence on respirator [ventilator] status: Secondary | ICD-10-CM | POA: Diagnosis not present

## 2022-03-28 DIAGNOSIS — J9601 Acute respiratory failure with hypoxia: Secondary | ICD-10-CM | POA: Diagnosis not present

## 2022-03-28 LAB — CBC
HCT: 27.5 % — ABNORMAL LOW (ref 36.0–46.0)
Hemoglobin: 8.9 g/dL — ABNORMAL LOW (ref 12.0–15.0)
MCH: 29 pg (ref 26.0–34.0)
MCHC: 32.4 g/dL (ref 30.0–36.0)
MCV: 89.6 fL (ref 80.0–100.0)
Platelets: 237 10*3/uL (ref 150–400)
RBC: 3.07 MIL/uL — ABNORMAL LOW (ref 3.87–5.11)
RDW: 14.1 % (ref 11.5–15.5)
WBC: 7.1 10*3/uL (ref 4.0–10.5)
nRBC: 0 % (ref 0.0–0.2)

## 2022-03-28 LAB — BASIC METABOLIC PANEL
Anion gap: 10 (ref 5–15)
BUN: 43 mg/dL — ABNORMAL HIGH (ref 8–23)
CO2: 24 mmol/L (ref 22–32)
Calcium: 9.2 mg/dL (ref 8.9–10.3)
Chloride: 109 mmol/L (ref 98–111)
Creatinine, Ser: 1.72 mg/dL — ABNORMAL HIGH (ref 0.44–1.00)
GFR, Estimated: 32 mL/min — ABNORMAL LOW (ref >60)
Glucose, Bld: 135 mg/dL — ABNORMAL HIGH (ref 70–99)
Potassium: 3.6 mmol/L (ref 3.5–5.1)
Sodium: 143 mmol/L (ref 135–145)

## 2022-03-28 LAB — GLUCOSE, CAPILLARY
Glucose-Capillary: 128 mg/dL — ABNORMAL HIGH (ref 70–99)
Glucose-Capillary: 232 mg/dL — ABNORMAL HIGH (ref 70–99)
Glucose-Capillary: 314 mg/dL — ABNORMAL HIGH (ref 70–99)
Glucose-Capillary: 156 mg/dL — ABNORMAL HIGH (ref 70–99)
Glucose-Capillary: 107 mg/dL — ABNORMAL HIGH (ref 70–99)
Glucose-Capillary: 209 mg/dL — ABNORMAL HIGH (ref 70–99)

## 2022-03-28 LAB — PHOSPHORUS: Phosphorus: 2.9 mg/dL (ref 2.5–4.6)

## 2022-03-28 LAB — MAGNESIUM: Magnesium: 2.5 mg/dL — ABNORMAL HIGH (ref 1.7–2.4)

## 2022-03-28 MED ORDER — POTASSIUM CHLORIDE 20 MEQ PO PACK
20.0000 meq | PACK | Freq: Once | ORAL | Status: AC
  Administered 2022-03-28: 20 meq
  Filled 2022-03-28: qty 1

## 2022-03-28 MED ORDER — SORBITOL 70 % SOLN
15.0000 mL | Freq: Once | Status: AC
  Administered 2022-03-28: 15 mL
  Filled 2022-03-28: qty 30

## 2022-03-28 MED ORDER — INSULIN ASPART 100 UNIT/ML IJ SOLN
4.0000 [IU] | INTRAMUSCULAR | Status: DC
  Administered 2022-03-28 – 2022-03-29 (×4): 4 [IU] via SUBCUTANEOUS

## 2022-03-28 MED ORDER — SENNA 8.6 MG PO TABS
2.0000 | ORAL_TABLET | Freq: Two times a day (BID) | ORAL | Status: DC
  Administered 2022-03-28 – 2022-04-17 (×24): 17.2 mg
  Filled 2022-03-28 (×26): qty 2

## 2022-03-28 NOTE — TOC CAGE-AID Note (Signed)
Transition of Care (TOC) - CAGE-AID Screening ? ? ?Patient Details  ?Name: [name] ?MRN: [id number] ?Date of Birth: 1951-09-09 ? ?Transition of Care (TOC) CM/SW Contact:    ?Ginnie Marich C Tarpley-Carter, LCSWA ?Phone Number: ?03/28/2022, 11:04 AM ? ? ?Clinical Narrative: ?Pt is unable to participate in Cage Aid. ?Pt is critically ill and intubated. ? ?Passenger transport manager, MSW, LCSW-A ?Pronouns:  She/Her/Hers ?[facility]itions of Care ?Clinical Social Worker ?Direct Number:  [phone] ?[email or web address] ? ?CAGE-AID Screening: ?Substance Abuse Screening unable to be completed due to: : Patient unable to participate ? ?  ?  ?  ?  ?  ? ?  ? ?  ? ? ? ? ? ? ?

## 2022-03-28 NOTE — Progress Notes (Signed)
?  Transition of Care (TOC) Screening Note ? ? ?Patient Details  ?Name: [name] ?Date of Birth: Jun 08, 1951 ? ? ?Transition of Care (TOC) CM/SW Contact:    ?Benard Halsted, LCSW ?Phone Number: ?03/28/2022, 9:18 AM ? ? ? ?Transition of Care Department [facility]) has reviewed patient. We will continue to monitor patient advancement through interdisciplinary progression rounds. If new patient transition needs arise, please place a TOC consult. ? ? ?

## 2022-03-28 NOTE — Progress Notes (Signed)
Physical Therapy Treatment ?Patient Details ?Name: [name] ?MRN: [id number] ?DOB: 20-Jul-1951 ?Today's Date: 03/28/2022 ? ? ?History of Present Illness Pt is a 71 year old woman admitted on 03/23/22 with dizziness, decreased balance and hoarse voice. + stroke L medulla. Patient coded and required CPR and intubation on 5/14. PMH: CVA, DM@, CKDIII, PVD, HTN, hypothyroidism. ? ?  ?PT Comments  ? ? Patient with much improved sitting balance this session able to achieve sitting with S with at least L and usually bilateral UE support.  She still has some L lateral lean, but with less external support able to figure out balance better and pushing less.  She tolerated well with BP 160/60.  Family still in room and supportive attempting to assist.  Patient will benefit from continued skilled PT in the acute setting and is an excellent candidate for acute inpatient rehab at d/c.    ?Recommendations for follow up therapy are one component of a multi-disciplinary discharge planning process, led by the attending physician.  Recommendations may be updated based on patient status, additional functional criteria and insurance authorization. ? ?Follow Up Recommendations ? Acute inpatient rehab (3hours/day) ?  ?  ?Assistance Recommended at Discharge Frequent or constant Supervision/Assistance  ?Patient can return home with the following Two people to help with walking and/or transfers;A lot of help with bathing/dressing/bathroom;Assist for transportation;Help with stairs or ramp for entrance ?  ?Equipment Recommendations ? Rolling walker (2 wheels)  ?  ?Recommendations for Other Services   ? ? ?  ?Precautions / Restrictions Precautions ?Precautions: Fall ?Precaution Comments: impulsive; wrist restraints, OG tube, vent  ?  ? ?Mobility ? Bed Mobility ?Overal bed mobility: Needs Assistance ?Bed Mobility: Supine to Sit, Sit to Supine ?  ?  ?Supine to sit: Mod assist, HOB elevated ?Sit to supine: Mod assist ?  ?General bed mobility  comments: pt moving legs off bed, assist to scoot hips and lift trunk, manage vent tubing, etc; to supine assist for legs, cues for managing upper trunk and pt scooting up in bed ?  ? ?Transfers ?Overall transfer level: Needs assistance ?  ?  ?  ?  ?  ?  ?  ? Lateral/Scoot Transfers: Mod assist ?General transfer comment: scooting up using legs toward [facility] about 4-5 scoots with mod A for balance, moving hips and patient using rail once in reach ?  ? ?Ambulation/Gait ?  ?  ?  ?  ?  ?  ?  ?  ? ? ?Stairs ?  ?  ?  ?  ?  ? ? ?Wheelchair Mobility ?  ? ?Modified Rankin (Stroke Patients Only) ?  ? ? ?  ?Balance Overall balance assessment: Needs assistance ?Sitting-balance support: Feet supported, Single extremity supported, Bilateral upper extremity supported ?Sitting balance-Leahy Scale: Poor ?Sitting balance - Comments: assist for balance, initially mod A, progressing to min then S with pt at times using rail for support, but able to let go with R hand to use yonker and attempted to place both hand in lap, but LOB to L.  patient pushing more with external assist. ?  ?  ?  ?  ?  ?  ?  ?  ?  ?  ?  ?  ?  ?  ?  ?  ? ?  ?Cognition Arousal/Alertness: Awake/alert ?Behavior During Therapy: Impulsive ?Overall Cognitive Status: Difficult to assess ?  ?  ?  ?  ?  ?  ?  ?  ?  ?  ?  ?  ?  ?  ?  ?  ?  General Comments: cues to wait and slow down due to impulsivity, follows commands well, shakes head appropriately ?  ?  ? ?  ?Exercises Other Exercises ?Other Exercises: pushing and pulling with UE's with manual resistance x 10 each arm ? ?  ?General Comments General comments (skin integrity, edema, etc.): VSS on PS/CPAP 40% FiO2 PEEP 5 ?  ?  ? ?Pertinent Vitals/Pain Pain Assessment ?Pain Assessment: No/denies pain  ? ? ?Home Living   ?  ?  ?  ?  ?  ?  ?  ?  ?  ?   ?  ?Prior Function    ?  ?  ?   ? ?PT Goals (current goals can now be found in the care plan section) Progress towards PT goals: Progressing toward goals ? ?  ?Frequency ? ? ? Min  4X/week ? ? ? ?  ?PT Plan Current plan remains appropriate  ? ? ?Co-evaluation   ?  ?  ?  ?  ? ?  ?AM-PAC PT "6 Clicks" Mobility   ?Outcome Measure ? Help needed turning from your back to your side while in a flat bed without using bedrails?: A Lot ?Help needed moving from lying on your back to sitting on the side of a flat bed without using bedrails?: A Lot ?Help needed moving to and from a bed to a chair (including a wheelchair)?: Total ?Help needed standing up from a chair using your arms (e.g., wheelchair or bedside chair)?: Total ?Help needed to walk in hospital room?: Total ?Help needed climbing 3-5 steps with a railing? : Total ?6 Click Score: 8 ? ?  ?End of Session Equipment Utilized During Treatment: Gait belt ?Activity Tolerance: Patient tolerated treatment well ?Patient left: in bed;with call bell/phone within reach;with family/visitor present ?  ?PT Visit Diagnosis: Other symptoms and signs involving the nervous system (R29.898);Dizziness and giddiness (R42);Hemiplegia and hemiparesis;Other abnormalities of gait and mobility (R26.89) ?Hemiplegia - Right/Left: Right ?Hemiplegia - dominant/non-dominant: Dominant ?Hemiplegia - caused by: Cerebral infarction ?  ? ? ?Time: 1210-1240 ?PT Time Calculation (min) (ACUTE ONLY): 30 min ? ?Charges:  $Therapeutic Activity: 23-37 mins          ?          ? ?Magda Kiel, PT ?Acute Rehabilitation Services ?HCWCB:[phone] ?[phone] ?03/28/2022 ? ? ? ?Reginia Naas ?03/28/2022, 12:51 PM ? ?

## 2022-03-28 NOTE — Progress Notes (Signed)
Inpatient Rehabilitation Admissions Coordinator  ? ?Remains on vent. Please reconsult [place] once extubated. ? ?Danne Baxter, RN, MSN ?Rehab Admissions Coordinator ?([phone] ?03/28/2022 3:17 PM ? ?

## 2022-03-28 NOTE — Progress Notes (Signed)
Patient's HR dropped to 32 at the lowest while on 0.6 of precedex.  Precedex turned off for this reason.  After stopping precedex patient reached up and put her hand on her ETT while respiratory was in the room.  Because of this I placed the patient in bilateral wrist restraints.  Patient resting comfortably in bed and HR back into the 60's all other VSS.  Will continue to monitor and assess need for restraints.   ?

## 2022-03-28 NOTE — Progress Notes (Signed)
STROKE TEAM PROGRESS NOTE  ? ?INTERVAL HISTORY ?Sister at the bedside. Pt lethargic but eyes open on voice, awake alert, hold me on both hands, showing me that she is strong bilaterally. She still intubated on weaning. Neuro stable, she may need trach or PEG, but will give her more days to monitoring.  ? ?Vitals:  ? 03/28/22 0734 03/28/22 0800 03/28/22 0900 03/28/22 1000  ?BP:  (!) 143/51 (!) 131/47   ?Pulse:  (!) 57 62 68  ?Resp:  13 15 (!) 22  ?Temp:      ?TempSrc:      ?SpO2: 99% 100% 99% 96%  ?Weight:      ?Height:      ? ?CBC:  ?Recent Labs  ?Lab 03/25/22 ?0157 03/26/22 ?1215 03/28/22 ?0357  ?WBC 6.8  --  7.1  ?HGB 11.8* 10.5* 8.9*  ?HCT 36.0 31.0* 27.5*  ?MCV 87.0  --  89.6  ?PLT 327  --  237  ? ?Basic Metabolic Panel:  ?Recent Labs  ?Lab 03/25/22 ?0157 03/26/22 ?0253 03/26/22 ?1215 03/28/22 ?0357  ?NA 134* 138 140 143  ?K 3.7 4.1 3.7 3.6  ?CL 100 103  --  109  ?CO2 23 25  --  24  ?GLUCOSE 266* 176*  --  135*  ?BUN 33* 46*  --  43*  ?CREATININE 1.46* 1.99*  --  1.72*  ?CALCIUM 10.1 9.3  --  9.2  ?MG 2.1  --   --  2.5*  ?PHOS  --   --   --  2.9  ? ?Lipid Panel:  ?Recent Labs  ?Lab 03/24/22 ?0214  ?CHOL 219*  ?TRIG 182*  ?HDL 41  ?CHOLHDL 5.3  ?VLDL 36  ?[place] 142*  ? ?HgbA1c:  ?Recent Labs  ?Lab 03/24/22 ?0214  ?HGBA1C 9.5*  ? ?Urine Drug Screen: No results for input(s): LABOPIA, COCAINSCRNUR, LABBENZ, AMPHETMU, THCU, LABBARB in the last 168 hours.  ?Alcohol Level No results for input(s): ETH in the last 168 hours. ? ?IMAGING past 24 hours ?No results found. ? ?PHYSICAL EXAM ?General:  Alert, well-developed, well-nourished, intubated elderly lady in no acute distress ?Respiratory:  Respirations synchronous with ventilator ? ?NEURO:  ?Awake alert, on vent, able to follow commands and communicate by nodding and gestures.  PERRL  Unable to abduct left eye and cannot raise left eyebrow.  Tongue midline, able to follow commands with all 4 extremities. Sensation symmetrical subjectively, b/l FTN intact.   ? ? ?ASSESSMENT/PLAN ?[name] is a 71 y.o. 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  MRI reveals a left medullary stroke.  On 5/14, patient was intubated due to respiratory distress. ? 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 Short segmental occlusion ?MRI  Small acute to early subacute infarct in left aspect of the medulla, chronic white matter microangiopathy ?MRA head and neck  high grade narrowing of left V4 segment, moderate narrowing of bilateral MCA ?Repeat MRI 5/13 stable left medullary infarct ?2D Echo EF 40-08%, grade 1 diastolic dysfunction, no atrial level shunt ?Loop recorder interrogation no A-fib ?LDL 142 ?HgbA1c 9.5 ?VTE prophylaxis - lovenox ?On Plavix and pletal prior to admission, now on aspirin 325 mg per tube.  Will hold off on Brillinta given likely need for trach/PEG.  ?Therapy recommendations:  CIR ?Disposition:  pending ? ?Respiratory failure ?Aspiration  ?Pt choked  on saliva  ?Likely aspirated, on unasyn  ?CCM on board ?Emergent intubated 5/14 ? 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  MRI showed right CR infarct.  MRA and carotid Doppler negative.  EF 60 to 65%.  LDL 87, A1c 8.9.  Discharged on Plavix and Pletal and Lopid.  Loop recorder was placed after discharge. ? ?Hypertension ?Home meds:  amlodipine 10 mg daily, losartan-HCTZ 100-25 mg daily ?Stable ?Long-term BP goal normotensive ? ?Hyperlipidemia ?Home meds:  gemfibrozil 600 mg daily, resumed in hospital ?LDL 142, goal < 70 ?High intensity statin not indicated due to  previous intolerance ?Consider lipid clinic referral ? ?Diabetes type II Uncontrolled ?Home meds:  lantus insulin 26 units BID ?HgbA1c 9.5, goal < 7.0 ?Now on insulin ?CBGs ?SSI ?Close PCP follow up for better DM control. ? ?Dysphagia ?Status post cortrak -> nosebleed -> cortrak removed ?Has OG tube now ?On TF ?May need PEG later ? ?Other Stroke Risk Factors ?Advanced Age >/= 26  ?Former cigarette smoker ?Hx stroke ? ?AKI on CKD3 ?Cre 1.38-1.46-1.99-1.72 ?CCM on board ?Avoid contrast when possible ?Renally dose medications as appropriate ? ?Other Active Problems ?Hypothyroidism, continue home synthroid ? ?Hospital day # 5 ? ?[name] Hawking, MD PhD ?Stroke Neurology ?03/28/2022 ?2:11 PM ? ?This patient is critically ill due to left medullary infarct, severe dysphagia and dysarthria, aspiration and at significant risk of neurological worsening, death form recurrent stroke, hemorrhagic transformation, sepsis, heart failure. This patient's care requires constant monitoring of vital signs, hemodynamics, respiratory and cardiac monitoring, review of multiple databases, neurological assessment, discussion with family, other specialists and medical decision making of high complexity. I spent 40 minutes of neurocritical care time in the care of this patient. ? ? ? ?To contact Stroke Continuity provider, please refer to [email or web address] ?After hours, contact General Neurology  ?

## 2022-03-28 NOTE — Progress Notes (Signed)
? ?NAME:  Jola Critzer, MRN:  [id number], DOB:  10/19/1951, LOS: 5 ?ADMISSION DATE:  03/23/2022, CONSULTATION DATE: 03/25/2022 ?REFERRING MD:  Jonetta Osgood, MD , CHIEF COMPLAINT: Increasing shortness of breath ? ?History of Present Illness:  ?71 year old female with poorly controlled diabetes, hypertension, CKD stage IIIb and prior history of CVA who presented with vertigo and progressive dysphagia, she was noted to have left medullary acute stroke, was admitted to hospital service for further management, stroke team is following.  This afternoon patient started with increasing dysphagia and unable to protect her airway, she required frequent suctioning, she became hypoxic and her oxygen requirement went up, now she is on Ventimask, PCCM was consulted for help evaluation and management ? ?Pertinent  Medical History  ? ?Past Medical History:  ?Diagnosis Date  ? Anemia   ? Arteriosclerotic cardiovascular disease   ? CKD (chronic kidney disease) stage 3, GFR 30-59 ml/min (HCC)   ? Claudication in peripheral vascular disease ([place])   ? CVA (cerebral vascular accident) [facility])   ? 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 GERD (gastroesophageal reflux disease)   ? Hyperlipidemia   ? declines statins  ? Hypertension   ? Hypothyroidism   ? Type 2 diabetes mellitus ([place]) 2000  ? Vitamin D deficiency   ? ? ? ?Significant Hospital Events: ?Including procedures, antibiotic start and stop dates in addition to other pertinent events   ?5/11 admitted ?5/14 intubated for cardiac arrest, likely 2/2 aspiration vs hypoxia ? ?Interim History / Subjective:  ?This morning she denies complaints. She slept well overnight. Tmax 99.4. ? ?Objective   ?Blood pressure (!) 163/62, pulse (!) 56, temperature 97.8 ?F (36.6 ?C), temperature source Axillary, resp. rate 20, height '5\' 2"'$  (1.575 m), weight 74.2 kg, SpO2 100 %. ?   ?Vent Mode: PRVC ?FiO2 (%):  [40 %-50 %] 40 % ?Set Rate:  [20 bmp] 20  bmp ?Vt Set:  [400 mL] 400 mL ?PEEP:  [5 cmH20] 5 cmH20 ?Plateau Pressure:  [15 cmH20-16 cmH20] 15 cmH20  ? ?Intake/Output Summary (Last 24 hours) at 03/28/2022 0716 ?Last data filed at 03/28/2022 0600 ?Gross per 24 hour  ?Intake 1119.97 ml  ?Output 920 ml  ?Net 199.97 ml  ? ? ?Filed Weights  ? 03/23/22 0736 03/25/22 0141 03/27/22 0500  ?Weight: 72.6 kg 74.9 kg 74.2 kg  ? ? ?Examination: ?  ?Physical exam: ?General: critically ill appearing woman lying in bed in NAD ?HEENT: [place]/AT, eyes anicteric ?Neuro: RASS -1, follows commands in all extremities, comprehension intact. Not able to easily swallow on command. Secretions in oropharynx, gagging some with suctioning. Strong cough reflex. ?Chest: less rhonchi today, thick secretions from ETT ?Heart: S1S2, RRR ?Abdomen: soft, NT ?Skin: warm, dry, no diffuse rashes ? ?BUN 43 ?Cr 1.72 ?WBC 7.1 ?H/H 8.9/27.5 ?Trach aspirate culture> abundant WBC > ?MRSA negative ? ?Resolved Hospital Problem list   ?Epistaxis ? ?Assessment & Plan:  ?Acute left medullary stroke ?Progressive dysphagia and dysphonia due to stroke ?-aspirin, statin. Holding PTA plavix. ?-PT, OT, progressive mobility. Chair position when in bed. ?-repeat SLP consult once extubated ?-monitor on tele ? ?Acute hypoxic respiratory failure due to aspiration pneumonia requiring MV ?-LTVV, 4-8cc/kg IBW with goal Pplat<30 and DP<15 ?-VAP prevention protocol ?-PAD protocol; precedex PRN. Goal RASS 0 to -1 ?-daily SAT & SBT as appropriate; may eventually need tracheostomy due to inability to handle her secretions ?-follow trach aspirate culture; con't empiric unasyn ? ?Constipation ?-increase  senna, con't miralax ?-adding sorbitol today ? ?Hypertension ?-con't PRN labetalol & hydralazine sustained SBP >180 ?-holding PTA amlodipine, losartan- HCTZ ? ?Hyperlipidemia ?-trial of high intensity statin- crestor '10mg'$  daily. Has previous intolerance with rash & nausea as OP ?-con't PTA gemfibrozil ? ?Poorly controlled diabetes with  hyperglycemia ?-PTA on lantus 26 units BID, not on short-acting insulin> here on 12 units lantus daily + SSI  ?-SSI PRN ?-increase TF coverage to 4 units Q4h with hold parameters ?-goal BG 140-180 ? ?AKI on CKD stage IIIb ?-renally dose meds, avoid nephrotoxic meds ?-monitor ?-strict I/Os ? ?Hypothyroidism ?-synthroid ? ?At risk of malnutrition ?-con't TF ?-eventually likely to require PEG ? ?Epistaxis, resolved ?-cortrak previously removed ?-Afrin PRN ?-con't chemical DVT prophylaxis ? ?Anemia with drop in Hb over the past few days, no obvious source of bleeding ?-monitor daily ?-monitor for signs of bleeding ?-con't GI prophylaxis ? ?Sister updated at bedside during rounds. ? ?Best Practice (right click and "Reselect all SmartList Selections" daily)  ? ?Diet/type: tubefeeds ?DVT prophylaxis: LMWH ?GI prophylaxis: H2B ?Lines: N/A ?Foley:  N/A ?Code Status:  full code ?Last date of multidisciplinary goals of care discussion [5/15, goals of care discussions are ongoing, updated patient, sister at bedside, daughter via phone during rounds. ? ?Labs   ?CBC: ?Recent Labs  ?Lab 03/23/22 ?0738 03/24/22 ?2341 03/25/22 ?0157 03/26/22 ?1215 03/28/22 ?0357  ?WBC 6.3 6.4 6.8  --  7.1  ?HGB 11.7* 11.6* 11.8* 10.5* 8.9*  ?HCT 36.2 34.0* 36.0 31.0* 27.5*  ?MCV 88.7 85.6 87.0  --  89.6  ?PLT 314 307 327  --  237  ? ? ? ?Basic Metabolic Panel: ?Recent Labs  ?Lab 03/23/22 ?0738 03/24/22 ?2341 03/25/22 ?0157 03/26/22 ?0253 03/26/22 ?1215 03/28/22 ?0357  ?NA 136 132* 134* 138 140 143  ?K 4.1 3.6 3.7 4.1 3.7 3.6  ?CL 102 99 100 103  --  109  ?CO2 '23 22 23 25  '$ --  24  ?GLUCOSE 382* 314* 266* 176*  --  135*  ?BUN 27* 36* 33* 46*  --  43*  ?CREATININE 1.41* 1.38* 1.46* 1.99*  --  1.72*  ?CALCIUM 9.9 10.1 10.1 9.3  --  9.2  ?MG  --   --  2.1  --   --  2.5*  ?PHOS  --   --   --   --   --  2.9  ? ? ?GFR: ?Estimated Creatinine Clearance: 28.7 mL/min (A) (by C-G formula based on SCr of 1.72 mg/dL (H)). ?Recent Labs  ?Lab 03/23/22 ?0738  03/24/22 ?2341 03/25/22 ?0157 03/28/22 ?0357  ?WBC 6.3 6.4 6.8 7.1  ? ? ? ? ? ? ?Critical care time:  ?  ? ?This patient is critically ill with multiple organ system failure which requires frequent high complexity decision making, assessment, support, evaluation, and titration of therapies. This was completed through the application of advanced monitoring technologies and extensive interpretation of multiple databases. During this encounter critical care time was devoted to patient care services described in this note for 35 minutes. ? ?[name], DO 03/28/22 9:07 AM ?[facility] ? ? ? ?

## 2022-03-29 ENCOUNTER — Inpatient Hospital Stay (HOSPITAL_COMMUNITY): Payer: Medicare Other

## 2022-03-29 ENCOUNTER — Encounter (HOSPITAL_COMMUNITY): Payer: Self-pay | Admitting: Internal Medicine

## 2022-03-29 DIAGNOSIS — J9601 Acute respiratory failure with hypoxia: Secondary | ICD-10-CM | POA: Diagnosis not present

## 2022-03-29 DIAGNOSIS — I69391 Dysphagia following cerebral infarction: Secondary | ICD-10-CM

## 2022-03-29 DIAGNOSIS — I2510 Atherosclerotic heart disease of native coronary artery without angina pectoris: Secondary | ICD-10-CM | POA: Diagnosis not present

## 2022-03-29 DIAGNOSIS — I639 Cerebral infarction, unspecified: Secondary | ICD-10-CM | POA: Diagnosis not present

## 2022-03-29 DIAGNOSIS — J9621 Acute and chronic respiratory failure with hypoxia: Secondary | ICD-10-CM | POA: Diagnosis not present

## 2022-03-29 DIAGNOSIS — M79604 Pain in right leg: Secondary | ICD-10-CM

## 2022-03-29 DIAGNOSIS — J9600 Acute respiratory failure, unspecified whether with hypoxia or hypercapnia: Secondary | ICD-10-CM | POA: Diagnosis not present

## 2022-03-29 DIAGNOSIS — J690 Pneumonitis due to inhalation of food and vomit: Secondary | ICD-10-CM

## 2022-03-29 DIAGNOSIS — M79605 Pain in left leg: Secondary | ICD-10-CM | POA: Diagnosis not present

## 2022-03-29 DIAGNOSIS — Z9911 Dependence on respirator [ventilator] status: Secondary | ICD-10-CM | POA: Diagnosis not present

## 2022-03-29 DIAGNOSIS — R040 Epistaxis: Secondary | ICD-10-CM

## 2022-03-29 HISTORY — DX: Epistaxis: R04.0

## 2022-03-29 LAB — GLUCOSE, CAPILLARY
Glucose-Capillary: 392 mg/dL — ABNORMAL HIGH (ref 70–99)
Glucose-Capillary: 424 mg/dL — ABNORMAL HIGH (ref 70–99)
Glucose-Capillary: 364 mg/dL — ABNORMAL HIGH (ref 70–99)
Glucose-Capillary: 151 mg/dL — ABNORMAL HIGH (ref 70–99)
Glucose-Capillary: 155 mg/dL — ABNORMAL HIGH (ref 70–99)
Glucose-Capillary: 197 mg/dL — ABNORMAL HIGH (ref 70–99)
Glucose-Capillary: 153 mg/dL — ABNORMAL HIGH (ref 70–99)

## 2022-03-29 LAB — TROPONIN I (HIGH SENSITIVITY)
Troponin I (High Sensitivity): 24 ng/L — ABNORMAL HIGH (ref <18)
Troponin I (High Sensitivity): 25 ng/L — ABNORMAL HIGH (ref <18)

## 2022-03-29 LAB — CBC
HCT: 28.2 % — ABNORMAL LOW (ref 36.0–46.0)
Hemoglobin: 8.5 g/dL — ABNORMAL LOW (ref 12.0–15.0)
MCH: 28.1 pg (ref 26.0–34.0)
MCHC: 30.1 g/dL (ref 30.0–36.0)
MCV: 93.1 fL (ref 80.0–100.0)
Platelets: 223 10*3/uL (ref 150–400)
RBC: 3.03 MIL/uL — ABNORMAL LOW (ref 3.87–5.11)
RDW: 14.2 % (ref 11.5–15.5)
WBC: 6.7 10*3/uL (ref 4.0–10.5)
nRBC: 0 % (ref 0.0–0.2)

## 2022-03-29 LAB — BASIC METABOLIC PANEL
Anion gap: 8 (ref 5–15)
BUN: 54 mg/dL — ABNORMAL HIGH (ref 8–23)
CO2: 23 mmol/L (ref 22–32)
Calcium: 8.9 mg/dL (ref 8.9–10.3)
Chloride: 110 mmol/L (ref 98–111)
Creatinine, Ser: 1.78 mg/dL — ABNORMAL HIGH (ref 0.44–1.00)
GFR, Estimated: 30 mL/min — ABNORMAL LOW (ref >60)
Glucose, Bld: 396 mg/dL — ABNORMAL HIGH (ref 70–99)
Potassium: 3.8 mmol/L (ref 3.5–5.1)
Sodium: 141 mmol/L (ref 135–145)

## 2022-03-29 LAB — PHOSPHORUS: Phosphorus: 3 mg/dL (ref 2.5–4.6)

## 2022-03-29 LAB — MAGNESIUM: Magnesium: 2.3 mg/dL (ref 1.7–2.4)

## 2022-03-29 MED ORDER — DEXMEDETOMIDINE HCL IN NACL 400 MCG/100ML IV SOLN
0.0000 ug/kg/h | INTRAVENOUS | Status: DC
  Administered 2022-03-29 – 2022-03-30 (×2): 0.4 ug/kg/h via INTRAVENOUS
  Administered 2022-03-31: 0.6 ug/kg/h via INTRAVENOUS
  Filled 2022-03-29 (×3): qty 100

## 2022-03-29 MED ORDER — INSULIN ASPART 100 UNIT/ML IJ SOLN
0.0000 [IU] | INTRAMUSCULAR | Status: DC
  Administered 2022-03-29: 20 [IU] via SUBCUTANEOUS
  Administered 2022-03-29 – 2022-03-30 (×6): 4 [IU] via SUBCUTANEOUS
  Administered 2022-03-30: 7 [IU] via SUBCUTANEOUS
  Administered 2022-03-30 (×2): 4 [IU] via SUBCUTANEOUS
  Administered 2022-03-31 (×2): 7 [IU] via SUBCUTANEOUS
  Administered 2022-03-31: 3 [IU] via SUBCUTANEOUS
  Administered 2022-03-31: 11 [IU] via SUBCUTANEOUS
  Administered 2022-04-01: 7 [IU] via SUBCUTANEOUS
  Administered 2022-04-01 (×2): 4 [IU] via SUBCUTANEOUS
  Administered 2022-04-01: 3 [IU] via SUBCUTANEOUS
  Administered 2022-04-02: 4 [IU] via SUBCUTANEOUS
  Administered 2022-04-02: 7 [IU] via SUBCUTANEOUS
  Administered 2022-04-02: 11 [IU] via SUBCUTANEOUS
  Administered 2022-04-02: 7 [IU] via SUBCUTANEOUS
  Administered 2022-04-02: 3 [IU] via SUBCUTANEOUS

## 2022-03-29 MED ORDER — INSULIN ASPART 100 UNIT/ML IJ SOLN
5.0000 [IU] | INTRAMUSCULAR | Status: DC
  Administered 2022-03-29 – 2022-03-30 (×6): 5 [IU] via SUBCUTANEOUS

## 2022-03-29 MED ORDER — INSULIN GLARGINE-YFGN 100 UNIT/ML ~~LOC~~ SOLN
25.0000 [IU] | Freq: Every day | SUBCUTANEOUS | Status: DC
  Administered 2022-03-29 – 2022-03-31 (×3): 25 [IU] via SUBCUTANEOUS
  Filled 2022-03-29 (×4): qty 0.25

## 2022-03-29 MED ORDER — FLEET ENEMA 7-19 GM/118ML RE ENEM
1.0000 | ENEMA | Freq: Once | RECTAL | Status: AC
  Administered 2022-03-29: 1 via RECTAL
  Filled 2022-03-29: qty 1

## 2022-03-29 MED ORDER — LIDOCAINE 5 % EX PTCH
1.0000 | MEDICATED_PATCH | CUTANEOUS | Status: DC
  Administered 2022-03-29: 1 via TRANSDERMAL
  Filled 2022-03-29 (×2): qty 1

## 2022-03-29 NOTE — Progress Notes (Signed)
Physical Therapy Treatment ?Patient Details ?Name: [name] ?MRN: [id number] ?DOB: 1950/12/03 ?Today's Date: 03/29/2022 ? ? ?History of Present Illness Pt is a 71 year old woman admitted on 03/23/22 with dizziness, decreased balance and hoarse voice. + stroke L medulla. Patient coded and required CPR and intubation on 5/14. PMH: CVA, DM@, CKDIII, PVD, HTN, hypothyroidism. ? ?  ?PT Comments  ? ? Patient more lethargic with sister stating poor sleep overnight and noted more difficulty with sitting balance more pushing to L making OOB to chair more difficult.  Once in chair able to position for safety and pt more  calm.  Daughter in the room and educated with RN about not using suction so much as pt becoming dependent on it.  Feel she remains appropriate for acute inpatient rehab prior to d/c home with family support.   ?Recommendations for follow up therapy are one component of a multi-disciplinary discharge planning process, led by the attending physician.  Recommendations may be updated based on patient status, additional functional criteria and insurance authorization. ? ?Follow Up Recommendations ? Acute inpatient rehab (3hours/day) ?  ?  ?Assistance Recommended at Discharge Frequent or constant Supervision/Assistance  ?Patient can return home with the following Two people to help with walking and/or transfers;A lot of help with bathing/dressing/bathroom;Assist for transportation;Help with stairs or ramp for entrance ?  ?Equipment Recommendations ? Rolling walker (2 wheels)  ?  ?Recommendations for Other Services   ? ? ?  ?Precautions / Restrictions Precautions ?Precautions: Fall  ?  ? ?Mobility ? Bed Mobility ?Overal bed mobility: Needs Assistance ?Bed Mobility: Sidelying to Sit ?  ?  ?Supine to sit: Max assist ?  ?  ?General bed mobility comments: heavy lifting trunk upright due to leaning hard L ?  ? ?Transfers ?Overall transfer level: Needs assistance ?  ?Transfers: Bed to chair/wheelchair/BSC ?  ?  ?   ?Squat pivot transfers: Max assist, +2 safety/equipment ?  ?  ?General transfer comment: patient leaning away from chair, but able to move hips over with RN assist for lines, pt safety as leaning L ?  ? ?Ambulation/Gait ?  ?  ?  ?  ?  ?  ?  ?  ? ? ?Stairs ?  ?  ?  ?  ?  ? ? ?Wheelchair Mobility ?  ? ?Modified Rankin (Stroke Patients Only) ?  ? ? ?  ?Balance   ?  ?Sitting balance-Leahy Scale: Zero ?Sitting balance - Comments: initially pushing to L, improved some at times, but needed mod support overall ?  ?  ?  ?  ?  ?  ?  ?  ?  ?  ?  ?  ?  ?  ?  ?  ? ?  ?Cognition Arousal/Alertness: Awake/alert ?Behavior During Therapy: Anxious ?Overall Cognitive Status: Difficult to assess ?Area of Impairment: Problem solving, Safety/judgement, Awareness, Following commands, Attention ?  ?  ?  ?  ?  ?  ?  ?  ?  ?Current Attention Level: Focused ?  ?Following Commands: Follows one step commands with increased time, Follows one step commands inconsistently ?Safety/Judgement: Decreased awareness of deficits, Decreased awareness of safety ?  ?Problem Solving: Slow processing, Decreased initiation ?General Comments: repeated cues for hand placement due to pt gripping rail and unable to pivot to chair ?  ?  ? ?  ?Exercises   ? ?  ?General Comments General comments (skin integrity, edema, etc.): on PRVC mode on vent 40% FiO2 PEEP 5 VSS ?  ?  ? ?  Pertinent Vitals/Pain Pain Assessment ?Pain Assessment: Faces ?Faces Pain Scale: Hurts a little bit ?Pain Location: generalized with mobility ?Pain Descriptors / Indicators: Grimacing, Guarding ?Pain Intervention(s): Monitored during session, Limited activity within patient's tolerance, Repositioned  ? ? ?Home Living   ?  ?  ?  ?  ?  ?  ?  ?  ?  ?   ?  ?Prior Function    ?  ?  ?   ? ?PT Goals (current goals can now be found in the care plan section) Progress towards PT goals: Progressing toward goals ? ?  ?Frequency ? ? ? Min 4X/week ? ? ? ?  ?PT Plan Current plan remains appropriate   ? ? ?Co-evaluation   ?  ?  ?  ?  ? ?  ?AM-PAC PT "6 Clicks" Mobility   ?Outcome Measure ? Help needed turning from your back to your side while in a flat bed without using bedrails?: A Lot ?Help needed moving from lying on your back to sitting on the side of a flat bed without using bedrails?: Total ?Help needed moving to and from a bed to a chair (including a wheelchair)?: Total ?Help needed standing up from a chair using your arms (e.g., wheelchair or bedside chair)?: Total ?Help needed to walk in hospital room?: Total ?Help needed climbing 3-5 steps with a railing? : Total ?6 Click Score: 7 ? ?  ?End of Session Equipment Utilized During Treatment: Gait belt ?Activity Tolerance: Patient limited by fatigue ?Patient left: in chair;with call bell/phone within reach;with nursing/sitter in room ?  ?PT Visit Diagnosis: Other symptoms and signs involving the nervous system (R29.898);Dizziness and giddiness (R42);Hemiplegia and hemiparesis;Other abnormalities of gait and mobility (R26.89) ?Hemiplegia - Right/Left: Right ?Hemiplegia - dominant/non-dominant: Dominant ?Hemiplegia - caused by: Cerebral infarction ?  ? ? ?Time: 1914-7829 ?PT Time Calculation (min) (ACUTE ONLY): 30 min ? ?Charges:  $Therapeutic Activity: 23-37 mins          ?          ? ?Magda Kiel, PT ?Acute Rehabilitation Services ?FAOZH:[phone] ?[phone] ?03/29/2022 ? ? ? ?Reginia Naas ?03/29/2022, 5:36 PM ? ?

## 2022-03-29 NOTE — Progress Notes (Signed)
Bilateral lower extremity venous duplex has been completed. ?Preliminary results can be found in CV Proc through chart review.  ? ?03/29/22 1:24 PM ?[name] RVT   ?

## 2022-03-29 NOTE — Progress Notes (Signed)
Inpatient Diabetes Program Recommendations ? ?AACE/ADA: New Consensus Statement on Inpatient Glycemic Control (2015) ? ?Target Ranges:  Prepandial:   less than 140 mg/dL ?     Peak postprandial:   less than 180 mg/dL (1-2 hours) ?     Critically ill patients:  140 - 180 mg/dL  ? ?Lab Results  ?Component Value Date  ? GLUCAP 364 (H) 03/29/2022  ? HGBA1C 9.5 (H) 03/24/2022  ? ? ?Review of Glycemic Control ? Latest Reference Range & Units 03/28/22 23:13 03/29/22 03:13 03/29/22 03:15 03/29/22 08:18  ?Glucose-Capillary 70 - 99 mg/dL 209 (H) 424 (H) 392 (H) 364 (H)  ?(H): Data is abnormally high ? ?Current orders for Inpatient glycemic control: Semglee 25 units QD, Novolog 0-20 units Q4H, Novolog 5 units Q4H ? ?MD increased Semglee to 25 units QD from 12 units QD.  Agree with current change as she received 67 units of Novolog in 24 hr period.  ? ? ? ? ?

## 2022-03-29 NOTE — Progress Notes (Signed)
Trop 24 ?EKG with only TWI in AVL, similar to previous. No other ischemic changes. ?[place] chest performed by Burr Medico-- no significant effusion on either side to explain her discomfort. ? ?Plan: lidoderm patch; possibly chest wall pain from chest compressions on 5/14 ? ?[name], DO 03/29/22 4:33 PM ?[facility] ? ?

## 2022-03-29 NOTE — Progress Notes (Addendum)
STROKE TEAM PROGRESS NOTE  ? ?INTERVAL HISTORY ?Sister at the bedside. Patient has been hemodynamically stable and her neurological exam is unchanged.  She is drowsy but will open eyes to voice and touch and will follow commands.  Plan is for trial of extubation tomorrow. ? ?Vitals:  ? 03/29/22 0800 03/29/22 0843 03/29/22 0900 03/29/22 1200  ?BP: (!) 155/53  (!) 148/49   ?Pulse: 69  72   ?Resp: 20  14   ?Temp: 97.6 ?F (36.4 ?C)   99 ?F (37.2 ?C)  ?TempSrc: Axillary   Axillary  ?SpO2: 99% 95% 98%   ?Weight:      ?Height:      ? ?CBC:  ?Recent Labs  ?Lab 03/28/22 ?9924 03/29/22 ?0209  ?WBC 7.1 6.7  ?HGB 8.9* 8.5*  ?HCT 27.5* 28.2*  ?MCV 89.6 93.1  ?PLT 237 223  ? ? ?Basic Metabolic Panel:  ?Recent Labs  ?Lab 03/28/22 ?2683 03/29/22 ?0209  ?NA 143 141  ?K 3.6 3.8  ?CL 109 110  ?CO2 24 23  ?GLUCOSE 135* 396*  ?BUN 43* 54*  ?CREATININE 1.72* 1.78*  ?CALCIUM 9.2 8.9  ?MG 2.5* 2.3  ?PHOS 2.9 3.0  ? ? ?Lipid Panel:  ?Recent Labs  ?Lab 03/24/22 ?0214  ?CHOL 219*  ?TRIG 182*  ?HDL 41  ?CHOLHDL 5.3  ?VLDL 36  ?LDLCALC 142*  ? ? ?HgbA1c:  ?Recent Labs  ?Lab 03/24/22 ?0214  ?HGBA1C 9.5*  ? ? ?Urine Drug Screen: No results for input(s): LABOPIA, COCAINSCRNUR, LABBENZ, AMPHETMU, THCU, LABBARB in the last 168 hours.  ?Alcohol Level No results for input(s): ETH in the last 168 hours. ? ?IMAGING past 24 hours ?DG CHEST PORT 1 VIEW ? ?Result Date: 03/29/2022 ?CLINICAL DATA:  [id number]; intubation EXAM: PORTABLE CHEST 1 VIEW COMPARISON:  Radiograph dated Mar 26, 2022 FINDINGS: Evaluation is limited by patient rotation. The cardiomediastinal silhouette is unchanged in contour.ETT tip terminates 5.5 cm above the carina. Enteric tube tip and side port project over the stomach. Cardiac loop recorder. Likely small LEFT pleural effusion. No pneumothorax. Persistent LEFT retrocardiac opacity and RIGHT basilar opacity, similar in comparison to prior. Enteric contrast within the colon. Atherosclerotic calcifications. IMPRESSION: 1.  Support  apparatus as described above. 2. Persistent bibasilar opacities with differential considerations including infection, aspiration or atelectasis. Likely small LEFT pleural effusion. Electronically Signed   By: Valentino Saxon M.D.   On: 03/29/2022 09:51   ? ?PHYSICAL EXAM ?General:  Alert, well-developed, well-nourished, intubated elderly lady in no acute distress ?Respiratory:  Respirations synchronous with ventilator ? ?NEURO:  ?Awake alert, on vent, able to follow commands and communicate by nodding and gestures.  Endorses continued diplopia  PERRL  Unable to abduct left eye and cannot raise left eyebrow.  Tongue midline, able to follow commands with all 4 extremities. Sensation symmetrical subjectively.  ? ? ?ASSESSMENT/PLAN ?[name] is a 71 y.o. 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  MRI reveals a left medullary stroke.  On 5/14, patient was intubated due to respiratory distress.  Will try to extubate tomorrow. ? 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 Short segmental occlusion ?MRI  Small acute to early subacute infarct in left aspect of the medulla, chronic white matter microangiopathy ?MRA head and neck  high grade narrowing of left V4 segment, moderate narrowing of bilateral  MCA ?Repeat MRI 5/13 stable left medullary infarct ?2D Echo EF 45-80%, grade 1 diastolic dysfunction, no atrial level shunt ?Loop recorder interrogation no A-fib ?LDL 142 ?HgbA1c 9.5 ?VTE prophylaxis - lovenox ?On Plavix and pletal prior to admission, now on aspirin 325 mg per tube.  Will hold off on Brillinta given likely need for trach/PEG.  ?Therapy recommendations:  CIR ?Disposition:  pending ? ?Respiratory failure ?Aspiration  ?Pt choked on saliva  ?Likely aspirated, on unasyn  ?CCM on board ?Emergent  intubated 5/14 ?CCM plan for trial of extubation, if fails, will consider trach ? 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  MRI showed right CR infarct.  MRA and carotid Doppler negative.  EF 60 to 65%.  LDL 87, A1c 8.9.  Discharged on Plavix and Pletal and Lopid.  Loop recorder was placed after discharge. ? ?Hypertension ?Home meds:  amlodipine 10 mg daily, losartan-HCTZ 100-25 mg daily ?Stable ?Long-term BP goal normotensive ? ?Hyperlipidemia ?Home meds:  gemfibrozil 600 mg daily, resumed in hospital ?LDL 142, goal < 70 ?High intensity statin not indicated due to previous intolerance ?Consider lipid clinic referral ? ?Diabetes type II Uncontrolled ?Home meds:  lantus insulin 26 units BID ?HgbA1c 9.5, goal < 7.0 ?Now on insulin ?CBGs ?SSI ?Close PCP follow up for better DM control. ? ?Dysphagia ?Status post cortrak -> nosebleed -> cortrak removed ?Has OG tube now ?Will try for Cortrak again today ?On TF ?May need PEG later ? ?Other Stroke Risk Factors ?Advanced Age >/= 60  ?Former cigarette smoker ?Hx stroke ? ?AKI on CKD3 ?Cre 1.38-1.46-1.99-1.72 ?CCM on board ?Avoid contrast when possible ?Renally dose medications as appropriate ? ?Left chest wall pain ?EKG no ST-T changes ?Chest x-ray unremarkable ?Likely related to chest compression on 5/14 ? ?B/l LE numbness ?Bilateral LE venous Doppler negative for DVT ?Supportive care ? ?Other Active Problems ?Hypothyroidism, continue home synthroid ? ?Hospital day # 6 ? ?[place] , MSN, AGACNP-BC ?[facility] ?See Amion for schedule and pager information ?03/29/2022 1:32 PM ?  ?ATTENDING NOTE: ?I reviewed above note and agree with the assessment and plan. Pt was seen and examined.  ? ?Sister at bedside.   Patient still lethargic, however able to open eyes on voice and follow simple commands on both sides.  Still intubated, on tube feeding.  CCM on board, considering trial of extubation, if fails, will consider tracheostomy.  Continue aspirin and statin. ? ?For detailed assessment and plan, please refer to above as I have made changes wherever appropriate.  ? ?[name], MD PhD ?Stroke Neurology ?03/29/2022 ?4:45 PM ? ?This patient is critically ill due to recent stroke, dysphagia, respiratory failure, status post intubation and cardiac arrest and at significant risk of neurological worsening, death form recurrent stroke, sepsis, cardiac rest. This patient's care requires constant monitoring of vital signs, hemodynamics, respiratory and cardiac monitoring, review of multiple databases, neurological assessment, discussion with family, other specialists and medical decision making of high complexity. I spent 30 minutes of neurocritical care time in the care of this patient. I had long discussion with sister at bedside, updated pt current condition, treatment plan and potential prognosis, and answered all the questions.  She expressed understanding and appreciation.  ? ?  ? ? ?To  contact Stroke Continuity provider, please refer to [email or web address] ?After hours, contact General Neurology  ?

## 2022-03-29 NOTE — Progress Notes (Addendum)
? ?NAME:  [name], MRN:  [id number], DOB:  [birth year], LOS: 6 ?ADMISSION DATE:  03/23/2022, CONSULTATION DATE: 03/25/2022 ?REFERRING MD:  Jonetta Osgood, MD , CHIEF COMPLAINT: Increasing shortness of breath ? ?History of Present Illness:  ?70 year old female with poorly controlled diabetes, hypertension, CKD stage IIIb and prior history of CVA who presented with vertigo and progressive dysphagia, she was noted to have left medullary acute stroke, was admitted to hospital service for further management, stroke team is following.  This afternoon patient started with increasing dysphagia and unable to protect her airway, she required frequent suctioning, she became hypoxic and her oxygen requirement went up, now she is on Ventimask, PCCM was consulted for help evaluation and management ? ?Pertinent  Medical History  ? ?Past Medical History:  ?Diagnosis Date  ? Anemia   ? Arteriosclerotic cardiovascular disease   ? CKD (chronic kidney disease) stage 3, GFR 30-59 ml/min (HCC)   ? Claudication in peripheral vascular disease ([place])   ? CVA (cerebral vascular accident) [facility])   ? 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 GERD (gastroesophageal reflux disease)   ? Hyperlipidemia   ? declines statins  ? Hypertension   ? Hypothyroidism   ? Type 2 diabetes mellitus ([place]) 2000  ? Vitamin D deficiency   ? ? ? ?Significant Hospital Events: ?Including procedures, antibiotic start and stop dates in addition to other pertinent events   ?5/11 admitted ?5/14 intubated for cardiac arrest, likely 2/2 aspiration vs hypoxia, Unasyn started ?5/17 awake. Following commands. Sig difficulty w/ glycemic control. Adjustments made to basal and ss dosing ? ?Interim History / Subjective:  ?No new complaints.  ? ?Objective   ?Blood pressure (Abnormal) 151/44, pulse 60, temperature 98.1 ?F (36.7 ?C), temperature source Axillary, resp. rate 20, height '5\' 2"'$  (1.575 m), weight 74.2 kg, SpO2 99 %. ?    ?Vent Mode: PRVC ?FiO2 (%):  [40 %] 40 % ?Set Rate:  [20 bmp] 20 bmp ?Vt Set:  [400 mL] 400 mL ?PEEP:  [5 cmH20] 5 cmH20 ?Plateau Pressure:  [14 cmH20-17 cmH20] 15 cmH20  ? ?Intake/Output Summary (Last 24 hours) at 03/29/2022 0733 ?Last data filed at 03/29/2022 0600 ?Gross per 24 hour  ?Intake 1818.87 ml  ?Output 1075 ml  ?Net 743.87 ml  ? ?Filed Weights  ? 03/23/22 0736 03/25/22 0141 03/27/22 0500  ?Weight: 72.6 kg 74.9 kg 74.2 kg  ? ? ?Examination: ?  ?General resting in bed communicating w/ her daughter. No distress ?HENT NCAT orally intubated. Does cont to have sig oral secretions ?Pulm Coarse scattered rhonchi  ?Card rrr ?Abd soft  ?Ext warm and dry  ?Neuro awake/ follows commands. No clear strength deficit  ? ?Resolved Hospital Problem list   ?Epistaxis ? ?Assessment & Plan:  ?Active Problems: ?  Hypothyroidism ?  DM2 (diabetes mellitus, type 2) ([place]) ?  HLD (hyperlipidemia) ?  HTN (hypertension) ?  Stage 3b chronic kidney disease (CKD) ([place]) ?  GERD (gastroesophageal reflux disease) ?  Normocytic anemia ?  Dysphagia due to recent stroke ?  CAD (coronary artery disease) ?  CVA (cerebral vascular accident) [facility]) ?  Acute respiratory failure ([place]) ?  Aspiration pneumonia ([place]) ? ? ? ?Acute left medullary stroke ?Progressive dysphagia and dysphonia due to stroke ?Plan ?Cont asa and statin ?Holding plavix ?PT/OT ?SLP after extubations vs trach ? ? ?Acute hypoxic respiratory failure due to aspiration pneumonia requiring MV ?Able to tolerate PSV ?Plan ?Cont full vent  support ?VAP bundle  ?PAD protocol RASS goal 0 to -1 ?Daily SBT->anticipate trial of extubation 5/18; if Fails extubation would favor early trach  ?Complete 7 days Unasyn  ? ?Constipation ?-increased senna, con't miralax ?-added sorbitol yesterday  ?Plan ?Fleet X 1 ? ?Hypertension ?Plan ?Cont PRN labetolol and hydralazine for sustained BP > 180 ?Holding PTA amlodipine, Losartan/HCTZ ? ?Hyperlipidemia ?-trial of high intensity statin- crestor '10mg'$  daily.  Has previous intolerance with rash & nausea as OP ?-con't PTA gemfibrozil ? ?Poorly controlled diabetes with hyperglycemia-> ?Increased TF coverage yesterday. She is on Semglee daily; glycemic control still very poor ?Plan ?Change ssi to resistant (currently on sensitive) ?Semglee adjusted to 25 Units ?Cont Increase TF coverage to 5 units ?Goal glucose 140-180 (no where near this currently)  ? ? ?AKI on CKD stage IIIb ?-BUN/cr relatively stable ?Plan ?Trend chems  ?Renal dose meds ?Watch I&O ? ?Hypothyroidism ?Plan ?Synthroid  ? ?At risk of malnutrition ?-has OGT currently. Not going to replace Nasogastric access as had severe epistaxis ?Plan ?Cont TFs via OGT ?Will need PEG ? ?Anemia  ?Slow trend down. Seems to have leveled off.  ?Plan ?Cont to trend  ? ?Best Practice (right click and "Reselect all SmartList Selections" daily)  ? ?Diet/type: tubefeeds ?DVT prophylaxis: LMWH ?GI prophylaxis: H2B ?Lines: N/A ?Foley:  N/A ?Code Status:  full code ?Last date of multidisciplinary goals of care discussion [5/15, goals of care discussions are ongoing, updated patient, sister at bedside, daughter via phone during rounds. ? ? ? ? ? ? ? ?Critical care time: 32 min  ?  ?Erick Colace ACNP-BC ?[place] ?Pager # [phone] OR # [phone] if no answer ? ? ? ? ? ?

## 2022-03-29 NOTE — Progress Notes (Signed)
Late entry.  ?OT Impression: Pt received in chair. Session focused on midline posture, UE bimanual grooming while sitting unsupported on scapular exercises. She continues to be limited by impaired vision with L eye patch donned, L lateral leaning and impaired activity tolerance.  ? ? 03/29/22 1734  ?OT Visit Information  ?Assistance Needed +2  ?History of Present Illness Pt is a 71 year old woman admitted on 03/23/22 with dizziness, decreased balance and hoarse voice. + stroke L medulla. Patient coded and required CPR and intubation on 5/14. PMH: CVA, DM@, CKDIII, PVD, HTN, hypothyroidism.  ?Precautions  ?Precautions Fall  ?Precaution Comments impulsive; wrist restraints, OG tube, vent  ?Restrictions  ?Weight Bearing Restrictions No  ?Pain Assessment  ?Faces Pain Scale 2  ?Pain Location generalized with mobility  ?Pain Descriptors / Indicators Grimacing;Guarding  ?Cognition  ?Arousal/Alertness Awake/alert  ?Behavior During Therapy Anxious  ?Overall Cognitive Status Difficult to assess  ?Area of Impairment Problem solving;Safety/judgement;Awareness;Following commands;Attention  ?Current Attention Level Focused  ?Following Commands Follows one step commands with increased time;Follows one step commands inconsistently  ?Safety/Judgement Decreased awareness of deficits;Decreased awareness of safety  ?Problem Solving Slow processing;Decreased initiation  ?General Comments cues for exercises with littel carryover. pt stating "no" to every question asked  ?Difficult to assess due to Intubated  ?Upper Extremity Assessment  ?RUE Deficits / Details generally weak with poor coordination  ?RUE Coordination decreased fine motor;decreased gross motor  ?LUE Deficits / Details generally weak with poor coordination  ?LUE Coordination decreased fine motor;decreased gross motor  ?Vision- Assessment  ?Vision Assessment? Vision impaired- to be further tested in functional context  ?Praxis  ?Praxis Not tested  ?ADL  ?Overall ADL's  Needs  assistance/impaired  ?Grooming Moderate assistance;Sitting  ?Grooming Details (indicate cue type and reason) face washing and lotion application while sitting unsupported in teh chair  ?General ADL Comments session focused on exercises and midline posture at the chair  ?Bed Mobility  ?Overal bed mobility Needs Assistance  ?Bed Mobility Sidelying to Sit  ?Supine to sit Max assist  ?General bed mobility comments heavy lifting trunk upright due to leaning hard L  ?Transfers  ?Overall transfer level Needs assistance  ?Transfers Bed to chair/wheelchair/BSC  ?Squat pivot transfers Max assist;+2 safety/equipment  ?General transfer comment patient leaning away from chair, but able to move hips over with RN assist for lines, pt safety as leaning L  ?Balance  ?Overall balance assessment Needs assistance  ?Sitting-balance support Feet supported;Single extremity supported;Bilateral upper extremity supported  ?Sitting balance-Leahy Scale Zero  ?Sitting balance - Comments initially pushing to L, improved some at times, but needed mod support overall  ?General Comments  ?General comments (skin integrity, edema, etc.) on PRVC mode on vent 40% FiO2 PEEP 5 VSS  ?Exercises  ?Exercises Other exercises  ?Other Exercises  ?Other Exercises scapular retraction, protraction and elevation in sitting. 10x each. family educated during exercises  ?OT - End of Session  ?Activity Tolerance Patient tolerated treatment well  ?Patient left in chair;with call bell/phone within reach;with chair alarm set;with family/visitor present  ?Nurse Communication Mobility status  ?OT Assessment/Plan  ?OT Plan Discharge plan remains appropriate  ?OT Visit Diagnosis Unsteadiness on feet (R26.81);Dizziness and giddiness (R42)  ?OT Frequency (ACUTE ONLY) Min 3X/week  ?Recommendations for Other Services Rehab consult  ?Follow Up Recommendations Acute inpatient rehab (3hours/day)  ?Assistance recommended at discharge Frequent or constant Supervision/Assistance   ?Patient can return home with the following Help with stairs or ramp for entrance;A lot of help with walking and/or transfers;A  lot of help with bathing/dressing/bathroom;Assistance with cooking/housework;Assistance with feeding;Direct supervision/assist for medications management;Direct supervision/assist for financial management;Assist for transportation  ?OT Equipment Other (comment)  ?AM-PAC OT "6 Clicks" Daily Activity Outcome Measure (Version 2)  ?Help from another person eating meals? 1  ?Help from another person taking care of personal grooming? 2  ?Help from another person toileting, which includes using toliet, bedpan, or urinal? 1  ?Help from another person bathing (including washing, rinsing, drying)? 2  ?Help from another person to put on and taking off regular upper body clothing? 2  ?Help from another person to put on and taking off regular lower body clothing? 1  ?6 Click Score 9  ?Progressive Mobility  ?What is the highest level of mobility based on the progressive mobility assessment? Level 1 (Bedfast) - Unable to balance while sitting on edge of bed  ?OT Goal Progression  ?Progress towards OT goals Progressing toward goals  ?Acute Rehab OT Goals  ?OT Goal Formulation With patient  ?Time For Goal Achievement 04/07/22  ?Potential to Achieve Goals Good  ?ADL Goals  ?Pt Will Perform Grooming with supervision;standing  ?Pt Will Perform Lower Body Bathing with supervision;sit to/from stand  ?Pt Will Perform Lower Body Dressing with supervision;sit to/from stand  ?Pt Will Transfer to Toilet with supervision;ambulating;regular height toilet  ?Pt Will Perform Toileting - Clothing Manipulation and hygiene with supervision;sit to/from stand  ?Additional ADL Goal #1 Pt will complete pill box test.  ?OT Time Calculation  ?OT Start Time (ACUTE ONLY) 1538  ?OT Stop Time (ACUTE ONLY) 1553  ?OT Time Calculation (min) 15 min  ?OT General Charges  ?$OT Visit 1 Visit  ?OT Treatments  ?$Therapeutic Exercise 8-22 mins   ?Perception  ?Perception Not tested  ? ?

## 2022-03-30 ENCOUNTER — Inpatient Hospital Stay (HOSPITAL_COMMUNITY): Payer: Medicare Other

## 2022-03-30 DIAGNOSIS — J690 Pneumonitis due to inhalation of food and vomit: Secondary | ICD-10-CM | POA: Diagnosis not present

## 2022-03-30 DIAGNOSIS — J9601 Acute respiratory failure with hypoxia: Secondary | ICD-10-CM | POA: Diagnosis not present

## 2022-03-30 DIAGNOSIS — R0789 Other chest pain: Secondary | ICD-10-CM | POA: Diagnosis not present

## 2022-03-30 DIAGNOSIS — I63212 Cerebral infarction due to unspecified occlusion or stenosis of left vertebral arteries: Secondary | ICD-10-CM | POA: Diagnosis not present

## 2022-03-30 DIAGNOSIS — E1159 Type 2 diabetes mellitus with other circulatory complications: Secondary | ICD-10-CM | POA: Diagnosis not present

## 2022-03-30 DIAGNOSIS — Z9911 Dependence on respirator [ventilator] status: Secondary | ICD-10-CM | POA: Diagnosis not present

## 2022-03-30 LAB — GLUCOSE, CAPILLARY
Glucose-Capillary: 198 mg/dL — ABNORMAL HIGH (ref 70–99)
Glucose-Capillary: 185 mg/dL — ABNORMAL HIGH (ref 70–99)
Glucose-Capillary: 173 mg/dL — ABNORMAL HIGH (ref 70–99)
Glucose-Capillary: 111 mg/dL — ABNORMAL HIGH (ref 70–99)
Glucose-Capillary: 210 mg/dL — ABNORMAL HIGH (ref 70–99)
Glucose-Capillary: 186 mg/dL — ABNORMAL HIGH (ref 70–99)

## 2022-03-30 LAB — CBC
HCT: 26.3 % — ABNORMAL LOW (ref 36.0–46.0)
Hemoglobin: 8.4 g/dL — ABNORMAL LOW (ref 12.0–15.0)
MCH: 28.9 pg (ref 26.0–34.0)
MCHC: 31.9 g/dL (ref 30.0–36.0)
MCV: 90.4 fL (ref 80.0–100.0)
Platelets: 253 10*3/uL (ref 150–400)
RBC: 2.91 MIL/uL — ABNORMAL LOW (ref 3.87–5.11)
RDW: 14.2 % (ref 11.5–15.5)
WBC: 4.9 10*3/uL (ref 4.0–10.5)
nRBC: 0 % (ref 0.0–0.2)

## 2022-03-30 LAB — BASIC METABOLIC PANEL
Anion gap: 9 (ref 5–15)
BUN: 44 mg/dL — ABNORMAL HIGH (ref 8–23)
CO2: 23 mmol/L (ref 22–32)
Calcium: 8.9 mg/dL (ref 8.9–10.3)
Chloride: 113 mmol/L — ABNORMAL HIGH (ref 98–111)
Creatinine, Ser: 1.54 mg/dL — ABNORMAL HIGH (ref 0.44–1.00)
GFR, Estimated: 36 mL/min — ABNORMAL LOW (ref >60)
Glucose, Bld: 210 mg/dL — ABNORMAL HIGH (ref 70–99)
Potassium: 3.7 mmol/L (ref 3.5–5.1)
Sodium: 145 mmol/L (ref 135–145)

## 2022-03-30 LAB — CULTURE, RESPIRATORY W GRAM STAIN: Culture: NO GROWTH

## 2022-03-30 LAB — PHOSPHORUS: Phosphorus: 3.2 mg/dL (ref 2.5–4.6)

## 2022-03-30 LAB — MAGNESIUM: Magnesium: 2.6 mg/dL — ABNORMAL HIGH (ref 1.7–2.4)

## 2022-03-30 MED ORDER — SORBITOL 70 % SOLN
960.0000 mL | TOPICAL_OIL | Freq: Once | ORAL | Status: AC
  Administered 2022-03-30: 960 mL via RECTAL
  Filled 2022-03-30: qty 473

## 2022-03-30 MED ORDER — SODIUM CHLORIDE 0.9 % IV SOLN
3.0000 g | Freq: Three times a day (TID) | INTRAVENOUS | Status: DC
  Administered 2022-03-30 – 2022-04-01 (×6): 3 g via INTRAVENOUS
  Filled 2022-03-30 (×6): qty 8

## 2022-03-30 MED ORDER — INSULIN ASPART 100 UNIT/ML IJ SOLN
8.0000 [IU] | INTRAMUSCULAR | Status: DC
  Administered 2022-03-30 – 2022-04-02 (×12): 8 [IU] via SUBCUTANEOUS

## 2022-03-30 MED ORDER — FREE WATER
200.0000 mL | Freq: Four times a day (QID) | Status: DC
  Administered 2022-03-30 – 2022-04-01 (×8): 200 mL

## 2022-03-30 MED ORDER — INSULIN ASPART 100 UNIT/ML IJ SOLN
8.0000 [IU] | INTRAMUSCULAR | Status: DC
  Administered 2022-03-30: 8 [IU] via SUBCUTANEOUS

## 2022-03-30 NOTE — Progress Notes (Addendum)
STROKE TEAM PROGRESS NOTE   INTERVAL HISTORY Daughter at the bedside. Patient has been hemodynamically stable and her neurological exam is unchanged.  She is drowsy due to sedation with Precedex but will open eyes to voice and touch and will follow commands.  Plan is for trial of extubation this weekend.  Plan for tracheostomy if trial fails.  Vitals:   03/30/22 0735 03/30/22 0800 03/30/22 1151 03/30/22 1200  BP: (!) 179/65 (!) 136/52 (!) 175/61 (!) 175/61  Pulse:  (!) 51  (!) 51  Resp:  20  20  Temp:  (!) 96.8 F (36 C)  (!) 96.8 F (36 C)  TempSrc:  Rectal    SpO2:  99%  98%  Weight:      Height:       CBC:  Recent Labs  Lab 03/29/22 0209 03/30/22 0344  WBC 6.7 4.9  HGB 8.5* 8.4*  HCT 28.2* 26.3*  MCV 93.1 90.4  PLT 223 431    Basic Metabolic Panel:  Recent Labs  Lab 03/29/22 0209 03/30/22 0344  NA 141 145  K 3.8 3.7  CL 110 113*  CO2 23 23  GLUCOSE 396* 210*  BUN 54* 44*  CREATININE 1.78* 1.54*  CALCIUM 8.9 8.9  MG 2.3 2.6*  PHOS 3.0 3.2    Lipid Panel:  Recent Labs  Lab 03/24/22 0214  CHOL 219*  TRIG 182*  HDL 41  CHOLHDL 5.3  VLDL 36  LDLCALC 142*    HgbA1c:  Recent Labs  Lab 03/24/22 0214  HGBA1C 9.5*    Urine Drug Screen: No results for input(s): LABOPIA, COCAINSCRNUR, LABBENZ, AMPHETMU, THCU, LABBARB in the last 168 hours.  Alcohol Level No results for input(s): ETH in the last 168 hours.  IMAGING past 24 hours DG Chest Port 1 View  Result Date: 03/30/2022 CLINICAL DATA:  A 71 year old female presents for evaluation of pneumonia, patient is intubated. EXAM: PORTABLE CHEST 1 VIEW COMPARISON:  Mar 29, 2022 FINDINGS: Endotracheal tube terminates between the clavicular heads approximately 4.8 cm from the carina. Tip between clavicular heads. Gastric tube with tip in the proximal to mid stomach, redundant loop in the fundus of the stomach. EKG leads project over the patient's chest. Cardiomediastinal contours and hilar structures are stable.  Cardiac loop recorder over the LEFT chest as before Persistent dense consolidation at the LEFT lung base obscuring LEFT hemidiaphragm. No visible pneumothorax. On limited assessment there is no acute skeletal finding. IMPRESSION: 1. Persistent LEFT basilar airspace disease with obscured LEFT hemidiaphragm, volume loss or pneumonia are considered, not changed from previous imaging. 2. Stable appearance of support apparatus. Electronically Signed   By: Zetta Bills M.D.   On: 03/30/2022 08:07   DG Abd Portable 1V  Result Date: 03/30/2022 CLINICAL DATA:  OG tube placement EXAM: PORTABLE ABDOMEN - 1 VIEW COMPARISON:  None Available. FINDINGS: Orogastric tube with the tip projecting over the stomach. Contrast in the colon. No bowel dilatation to suggest obstruction. No evidence of pneumoperitoneum, portal venous gas or pneumatosis. No pathologic calcifications along the expected course of the ureters. No acute osseous abnormality. IMPRESSION: 1. Orogastric tube with the tip projecting over the stomach. Electronically Signed   By: Kathreen Devoid M.D.   On: 03/30/2022 09:54    PHYSICAL EXAM General:  Alert, well-developed, well-nourished, intubated elderly lady in no acute distress Respiratory:  Respirations synchronous with ventilator  NEURO:  Awake alert, on vent, able to follow commands and communicate by nodding and gestures.  Endorses continued diplopia  PERRL  Unable to abduct left eye completely.  Tongue midline, able to follow commands with all 4 extremities.    ASSESSMENT/PLAN [name] is a 71 y.o. 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  MRI reveals a left medullary stroke.  On 5/14, patient was intubated due to respiratory distress.  Will try to extubate over the weekend, plan for trach/PEG if unsuccessful.  Stroke:  left medulalry infarct  likely secondary large vessel disease of left V4 high grade stenosis vs. Short segmental occlusion MRI  Small acute to early subacute infarct in left aspect of the medulla, chronic white matter microangiopathy MRA head and neck  high grade narrowing of left V4 segment, moderate narrowing of bilateral MCA Repeat MRI 5/13 stable left medullary infarct 2D Echo EF 37-90%, grade 1 diastolic dysfunction, no atrial level shunt Loop recorder interrogation no A-fib LDL 142 HgbA1c 9.5 VTE prophylaxis - lovenox On Plavix and pletal prior to admission, now on aspirin 325 mg per tube.  Will hold off on Brillinta given likely need for trach/PEG.  Therapy recommendations:  CIR Disposition:  pending  Respiratory failure Aspiration  Pt choked on saliva  Likely aspirated, on unasyn  CCM on board Emergent intubated 5/14 CCM plan for trial of extubation, if fails, will consider trach   Hx of stroke First one in 2004 and on plavix 01/2011 right CC stroke with left sided weakness, balance issue and slurry speech - residue of walking with hopping on the left side - on plavix and pletal 02/2018 admitted for left-sided weakness, left upper extremity muscle spasm, headache.  MRI showed right CR infarct.  MRA and carotid Doppler negative.  EF 60 to 65%.  LDL 87, A1c 8.9.  Discharged on Plavix and Pletal and Lopid.  Loop recorder was placed after discharge.  Hypertension Home meds:  amlodipine 10 mg daily, losartan-HCTZ 100-25 mg daily Stable Long-term BP goal normotensive  Hyperlipidemia Home meds:  gemfibrozil 600 mg daily, resumed in hospital LDL 142, goal < 70 High intensity statin not indicated due to previous intolerance Consider lipid clinic referral  Diabetes type II Uncontrolled Home meds:  lantus insulin 26 units BID HgbA1c 9.5, goal < 7.0 Now on insulin CBGs SSI Close PCP follow up for better DM control.  Dysphagia Status post cortrak -> nosebleed -> cortrak removed Has OG tube now Will  try for Cortrak again today On TF May need PEG later  Other Stroke Risk Factors Advanced Age >/= 41  Former cigarette smoker Hx stroke  AKI on CKD3 Cre 1.38-1.46-1.99-1.72 CCM on board Avoid contrast when possible Renally dose medications as appropriate  Left chest wall pain EKG no ST-T changes Chest x-ray unremarkable Likely related to chest compression on 5/14  B/l LE numbness Bilateral LE venous Doppler negative for DVT Supportive care  Other Active Problems Hypothyroidism, continue home synthroid  Hospital day # [place] , MSN, AGACNP-BC [facility] See Amion for schedule and pager information 03/30/2022 1:26 PM  ATTENDING NOTE: I reviewed above note and agree with the assessment and plan. Pt was seen and examined.   Daughter at bedside.  Patient still intubated, on Precedex, lethargic, however able to open eyes on voice, follows simple commands, strong bilateral upper and lower extremities.  Still has diplopia and disconjugate eyes.  Discussed with CCM Dr. [name], likely plan for trach and  PEG early next week. I had long discussion with daughter at bedside, updated pt current condition, treatment plan and potential prognosis, and answered all the questions.  She expressed understanding and appreciation.   For detailed assessment and plan, please refer to above as I have made changes wherever appropriate.   [name], MD PhD Stroke Neurology 03/30/2022 5:21 PM  This patient is critically ill due to brainstem stroke, dysphagia, dysarthria, respiratory failure, cardiac rest and at significant risk of neurological worsening, death form recurrent stroke, heart failure, respiratory failure. This patient's care requires constant monitoring of vital signs, hemodynamics, respiratory and cardiac monitoring, review of multiple databases, neurological assessment, discussion with family, other specialists and medical decision making of high complexity.  I spent 40 minutes of neurocritical care time in the care of this patient.    To contact Stroke Continuity provider, please refer to [email or web address] After hours, contact General Neurology

## 2022-03-30 NOTE — Progress Notes (Addendum)
NAME:  [name], MRN:  [id number], DOB:  1951-06-07, LOS: 7 ADMISSION DATE:  03/23/2022, CONSULTATION DATE: 03/25/2022 REFERRING MD:  Jonetta Osgood, MD , CHIEF COMPLAINT: Increasing shortness of breath  History of Present Illness:  71 year old female with poorly controlled diabetes, hypertension, CKD stage IIIb and prior history of CVA who presented with vertigo and progressive dysphagia, she was noted to have left medullary acute stroke, was admitted to hospital service for further management, stroke team is following.  This afternoon patient started with increasing dysphagia and unable to protect her airway, she required frequent suctioning, she became hypoxic and her oxygen requirement went up, now she is on Ventimask, PCCM was consulted for help evaluation and management  Pertinent  Medical History   Past Medical History:  Diagnosis Date   Anemia    Arteriosclerotic cardiovascular disease    CKD (chronic kidney disease) stage 3, GFR 30-59 ml/min (HCC)    Claudication in peripheral vascular disease ([place])    CVA (cerebral vascular accident) ([place])    2012 and 2019-affected left side + dysarthria 2/2 severe R ACA and MCA stenosis   Epistaxis 03/29/2022   GERD (gastroesophageal reflux disease)    Hyperlipidemia    declines statins   Hypertension    Hypothyroidism    Type 2 diabetes mellitus ([place]) 2000   Vitamin D deficiency      Significant Hospital Events: Including procedures, antibiotic start and stop dates in addition to other pertinent events   5/11 admitted 5/14 intubated for cardiac arrest, likely 2/2 aspiration vs hypoxia, Unasyn started 5/17 awake. Following commands. Sig difficulty w/ glycemic control. Adjustments made to basal and ss dosing 5/18 awake. VTs low on PSV. Failed initial am SBT (sats 60s; reporting SOB).   Interim History / Subjective:  No events over night/afebrile  Failed am attempt at PSV  Objective   Blood pressure (Abnormal) 148/54,  pulse (Abnormal) 52, temperature (Abnormal) 97.5 F (36.4 C), temperature source Axillary, resp. rate 17, height '5\' 2"'$  (1.575 m), weight 74.8 kg, SpO2 99 %.    Vent Mode: PRVC FiO2 (%):  [40 %] 40 % Set Rate:  [20 bmp] 20 bmp Vt Set:  [400 mL] 400 mL PEEP:  [5 cmH20] 5 cmH20 Pressure Support:  [10 cmH20] 10 cmH20 Plateau Pressure:  [7 cmH20-17 cmH20] 16 cmH20   Intake/Output Summary (Last 24 hours) at 03/30/2022 0716 Last data filed at 03/30/2022 0600 Gross per 24 hour  Intake 1452.52 ml  Output 700 ml  Net 752.52 ml   Filed Weights   03/25/22 0141 03/27/22 0500 03/30/22 0500  Weight: 74.9 kg 74.2 kg 74.8 kg    Examination:  general 71 year old female. Awake in bed no distress but did get SOB on SBT HENT NCAT no JVD  Pulm scattered rhonchi, Vts 300s on SBT but got SOB and desaturated to 60s PCXR low volume. On-going left basilar atx vs PNA  Card rrr Abd soft  Ext warm and dry  Neuro awake. Moves all ext follow commands good strength   Resolved Hospital Problem list   Epistaxis  Assessment & Plan:  Active Problems:   Hypothyroidism   DM2 (diabetes mellitus, type 2) (HCC)   HLD (hyperlipidemia)   HTN (hypertension)   Stage 3b chronic kidney disease (CKD) (HCC)   GERD (gastroesophageal reflux disease)   Normocytic anemia   Dysphagia due to recent stroke   CAD (coronary artery disease)   CVA (cerebral vascular accident) ([place])   Acute respiratory failure (  [place])   Aspiration pneumonia ([place])    Acute left medullary stroke Progressive dysphagia and dysphonia due to stroke Plan ASA  Holding plavix PT/OT/rehab efforts   Acute hypoxic respiratory failure due to aspiration pneumonia requiring MV More atelectasis today. Failed SBT. ? How much constipation playing a role at this point.  Plan Cont full vent support for now Allow her to wake up more and re-attempt SBT later this am Day 6 of 7 Unasyn; cultures remain neg Working for extubation if re-intubated would need  trach  Am cxr (pulled out OGT need to be sure no new aspiration)  Rx constipation    Constipation -increased senna, con't miralax -added sorbitol yesterday  Plan SMOG today   Hypertension -PTA amlodipine, Losartan/HCTZ Plan Cont PRN labetolol and Hydralazine For BP > 180  Hyperlipidemia -trial of high intensity statin- crestor '10mg'$  daily. Has previous intolerance with rash & nausea as OP Plan Cont Gemfibrozil  Poorly controlled diabetes with hyperglycemia-> Increased TF coverage yesterday. She is on Semglee daily; glycemic control still very poor Plan Increase TF coverage to 8 units  Hold Semglee at 25 as may extubate Cont resistant scale ssi   AKI on CKD stage IIIb -BUN/cr relatively stable Plan Trend chems Renal dose meds  Fluid and Electrolyte imbalance: hyperchloremia  Plan Adding free water to tubefeeds  Hypothyroidism Plan Synthroid   At risk of malnutrition -has OGT currently. Not going to replace Nasogastric access as had severe epistaxis Plan TFs via OGT Swallow eval post-extubation if clinically appears appropriate; if fails PEG  Anemia  Slow trend down. Seems to have leveled off.  Plan trend  Best Practice (right click and "Reselect all SmartList Selections" daily)   Diet/type: tubefeeds DVT prophylaxis: LMWH GI prophylaxis: H2B Lines: N/A Foley:  N/A Code Status:  full code Last date of multidisciplinary goals of care discussion [5/15, goals of care discussions are ongoing, updated patient, sister at bedside, daughter via phone during rounds.   Critical care time: 33 min    Erick Colace ACNP-BC [place] Pager # [phone] OR # [phone] if no answer

## 2022-03-30 NOTE — Progress Notes (Signed)
PT Cancellation Note  Patient Details Name: Torian Thoennes MRN: [id number] DOB: 12/10/1950   Cancelled Treatment:    Reason Eval/Treat Not Completed: Patient declined, no reason specified; Patient declined OOB or EOB today despite encouragement and daughter reports had medications making her sleepy.  Will attempt another day.   Reginia Naas 03/30/2022, 11:53 AM Magda Kiel, PT Acute Rehabilitation Services ICHTV:[phone] Office:[phone] 03/30/2022

## 2022-03-30 NOTE — Progress Notes (Signed)
Nutrition Follow-up  DOCUMENTATION CODES:   Not applicable  INTERVENTION:   Jevity 1.5 @ 50 mL/hr (1200 mL/day) via OG tube 45 mL ProSource TF - BID  Provides 1880 kcal, 99 gm of protein, 912 mL free water    200 ml free water every 6 hours Total free water: 1712 ml   NUTRITION DIAGNOSIS:   Inadequate oral intake related to inability to eat as evidenced by NPO status. Ongoing.   GOAL:   Patient will meet greater than or equal to 90% of their needs Met with TF at goal   MONITOR:   Diet advancement, Labs, Weight trends, TF tolerance  REASON FOR ASSESSMENT:   Consult Enteral/tube feeding initiation and management  ASSESSMENT:   71 y.o. female presented to the ED with weakness and lightheadedness. PMH includes CVA, T2DM, HTN, CKD IIIb, and GERD. Pt admitted with CVA.  Pt discussed during ICU rounds and with RN and MD. Per rounds plan for extubation tomorrow and if fails plan for trach.  OG tube came out overnight but has now been replaced.  Insulin adjusted by CCM due to ongoing high blood sugars.  No BM since 5/10 - SMOG enema ordered   5/11 admitted 5/12 s/p cortrak placement later removed due to nose bleed 5/13 tx to ICU 5/14 developed respiratory distress due to aspiration and required emergent intubation.     Medications reviewed and include: colace, pepcid, SSI, novolog, semglee, synthroid, miralax, senokot, SMOG enema x 1   Labs reviewed: TG 182 A1C: 9.5 (5/12) CBG's: 153-198 (TF off after OG pulled out)  OG tube; tip projecting over the stomach    Diet Order:   Diet Order             Diet NPO time specified Except for: Ice Chips  Diet effective now                   EDUCATION NEEDS:   No education needs have been identified at this time  Skin:  Skin Assessment: Reviewed RN Assessment  Last BM:  5/10  Height:   Ht Readings from Last 1 Encounters:  03/23/22 _0  (1.575 m)    Weight:   Wt Readings from Last 1 Encounters:   03/30/22 74.8 kg    Ideal Body Weight:  50 kg  BMI:  Body mass index is 30.16 kg/m.  Estimated Nutritional Needs:   Kcal:  1800-2000  Protein:  90-105 grams  Fluid:  >/= 1.8 L  Kaislyn Gulas P., RD, LDN, CNSC See AMiON for contact information

## 2022-03-31 ENCOUNTER — Inpatient Hospital Stay (HOSPITAL_COMMUNITY): Payer: Medicare Other

## 2022-03-31 DIAGNOSIS — J811 Chronic pulmonary edema: Secondary | ICD-10-CM

## 2022-03-31 DIAGNOSIS — J9601 Acute respiratory failure with hypoxia: Secondary | ICD-10-CM | POA: Diagnosis not present

## 2022-03-31 DIAGNOSIS — R55 Syncope and collapse: Secondary | ICD-10-CM | POA: Diagnosis not present

## 2022-03-31 DIAGNOSIS — Z9911 Dependence on respirator [ventilator] status: Secondary | ICD-10-CM | POA: Diagnosis not present

## 2022-03-31 DIAGNOSIS — R1310 Dysphagia, unspecified: Secondary | ICD-10-CM | POA: Diagnosis not present

## 2022-03-31 DIAGNOSIS — E1159 Type 2 diabetes mellitus with other circulatory complications: Secondary | ICD-10-CM | POA: Diagnosis not present

## 2022-03-31 DIAGNOSIS — I63212 Cerebral infarction due to unspecified occlusion or stenosis of left vertebral arteries: Secondary | ICD-10-CM | POA: Diagnosis not present

## 2022-03-31 LAB — BASIC METABOLIC PANEL
Anion gap: 12 (ref 5–15)
BUN: 34 mg/dL — ABNORMAL HIGH (ref 8–23)
CO2: 24 mmol/L (ref 22–32)
Calcium: 9 mg/dL (ref 8.9–10.3)
Chloride: 108 mmol/L (ref 98–111)
Creatinine, Ser: 1.68 mg/dL — ABNORMAL HIGH (ref 0.44–1.00)
GFR, Estimated: 33 mL/min — ABNORMAL LOW (ref >60)
Glucose, Bld: 154 mg/dL — ABNORMAL HIGH (ref 70–99)
Potassium: 3.7 mmol/L (ref 3.5–5.1)
Sodium: 144 mmol/L (ref 135–145)

## 2022-03-31 LAB — GLUCOSE, CAPILLARY
Glucose-Capillary: 89 mg/dL (ref 70–99)
Glucose-Capillary: 145 mg/dL — ABNORMAL HIGH (ref 70–99)
Glucose-Capillary: 211 mg/dL — ABNORMAL HIGH (ref 70–99)
Glucose-Capillary: 260 mg/dL — ABNORMAL HIGH (ref 70–99)
Glucose-Capillary: 200 mg/dL — ABNORMAL HIGH (ref 70–99)
Glucose-Capillary: 73 mg/dL (ref 70–99)

## 2022-03-31 LAB — PHOSPHORUS: Phosphorus: 4.1 mg/dL (ref 2.5–4.6)

## 2022-03-31 LAB — CBC
HCT: 27.3 % — ABNORMAL LOW (ref 36.0–46.0)
Hemoglobin: 8.7 g/dL — ABNORMAL LOW (ref 12.0–15.0)
MCH: 29 pg (ref 26.0–34.0)
MCHC: 31.9 g/dL (ref 30.0–36.0)
MCV: 91 fL (ref 80.0–100.0)
Platelets: 254 10*3/uL (ref 150–400)
RBC: 3 MIL/uL — ABNORMAL LOW (ref 3.87–5.11)
RDW: 14.2 % (ref 11.5–15.5)
WBC: 6.4 10*3/uL (ref 4.0–10.5)
nRBC: 0.3 % — ABNORMAL HIGH (ref 0.0–0.2)

## 2022-03-31 LAB — MAGNESIUM: Magnesium: 2.4 mg/dL (ref 1.7–2.4)

## 2022-03-31 MED ORDER — POLYETHYLENE GLYCOL 3350 17 G PO PACK
17.0000 g | PACK | Freq: Two times a day (BID) | ORAL | Status: DC
  Administered 2022-03-31 – 2022-04-17 (×19): 17 g
  Filled 2022-03-31 (×19): qty 1

## 2022-03-31 MED ORDER — FENTANYL CITRATE PF 50 MCG/ML IJ SOSY
PREFILLED_SYRINGE | INTRAMUSCULAR | Status: DC
Start: 2022-03-31 — End: 2022-03-31
  Filled 2022-03-31: qty 1

## 2022-03-31 MED ORDER — DICLOFENAC EPOLAMINE 1.3 % EX PTCH
1.0000 | MEDICATED_PATCH | Freq: Two times a day (BID) | CUTANEOUS | Status: DC
  Administered 2022-03-31 – 2022-04-15 (×23): 1 via TRANSDERMAL
  Filled 2022-03-31 (×39): qty 1

## 2022-03-31 MED ORDER — MIDAZOLAM HCL 2 MG/2ML IJ SOLN
2.0000 mg | INTRAMUSCULAR | Status: DC | PRN
  Administered 2022-03-31 – 2022-04-03 (×10): 2 mg via INTRAVENOUS
  Filled 2022-03-31 (×12): qty 2

## 2022-03-31 MED ORDER — FENTANYL BOLUS VIA INFUSION
50.0000 ug | INTRAVENOUS | Status: DC | PRN
  Administered 2022-03-31: 50 ug via INTRAVENOUS
  Administered 2022-04-01: 100 ug via INTRAVENOUS

## 2022-03-31 MED ORDER — POTASSIUM CHLORIDE 20 MEQ PO PACK
40.0000 meq | PACK | Freq: Every day | ORAL | Status: DC
  Administered 2022-03-31 – 2022-04-08 (×6): 40 meq
  Filled 2022-03-31 (×6): qty 2

## 2022-03-31 MED ORDER — FENTANYL 2500MCG IN NS 250ML (10MCG/ML) PREMIX INFUSION
50.0000 ug/h | INTRAVENOUS | Status: DC
  Administered 2022-03-31: 50 ug/h via INTRAVENOUS
  Filled 2022-03-31: qty 250

## 2022-03-31 MED ORDER — FUROSEMIDE 10 MG/ML IJ SOLN
40.0000 mg | Freq: Once | INTRAMUSCULAR | Status: AC
  Administered 2022-03-31: 40 mg via INTRAVENOUS
  Filled 2022-03-31: qty 4

## 2022-03-31 MED ORDER — LORAZEPAM 2 MG/ML IJ SOLN
INTRAMUSCULAR | Status: AC
  Filled 2022-03-31: qty 1

## 2022-03-31 MED ORDER — LORAZEPAM 2 MG/ML IJ SOLN
INTRAMUSCULAR | Status: AC
Start: 2022-03-31 — End: 2022-04-01
  Filled 2022-03-31: qty 1

## 2022-03-31 MED ORDER — FENTANYL CITRATE PF 50 MCG/ML IJ SOSY
50.0000 ug | PREFILLED_SYRINGE | Freq: Once | INTRAMUSCULAR | Status: AC
  Administered 2022-03-31: 50 ug via INTRAVENOUS

## 2022-03-31 NOTE — Progress Notes (Signed)
[name] Progress Note Patient Name: [name] DOB: January 24, 1951 MRN: [id number]   Date of Service  03/31/2022  HPI/Events of Note  Notified by radiology that ETT is above the level of the clavicles. Bedside nurse denies cuff leak.   eICU Interventions  Plan: PCCM day rounding team (Dr. [name] and Marni Griffon) notified of radiographic findings and need to advance ETT. Direct visualization may be the safest method.      Intervention Category Major Interventions: Other:  Lysle Dingwall 03/31/2022, 7:04 AM

## 2022-03-31 NOTE — Progress Notes (Signed)
ETT advanced to 24 cm at the lip. Pt's ETT had come out to 21 at the lip. No complications noted with advancement.

## 2022-03-31 NOTE — TOC Initial Note (Signed)
Transition of Care [facility]) - Initial/Assessment Note    Patient Details  Name: [name] MRN: [id number] Date of Birth: 1951-06-20  Transition of Care [facility]) CM/SW Contact:    Ella Bodo, RN Phone Number: 03/31/2022, 4:06 PM  Clinical Narrative:                 Pt is a 71 year old woman admitted on 03/23/22 with dizziness, decreased balance and hoarse voice. + stroke L medulla. Patient coded and required CPR and intubation on 5/14. Prior to admission, patient independent and living at home alone; she has supportive family, who are available to assist at discharge.  PT/OT recommending inpatient rehab.  Anticipate trial extubation on Sunday, and plan for tracheostomy if fails.  TOC will continue to follow for disposition.   Expected Discharge Plan: IP Rehab Facility Barriers to Discharge: Continued Medical Work up      Expected Discharge Plan and Services Expected Discharge Plan: [place]   Discharge Planning Services: CM Consult   Living arrangements for the past 2 months: Single Family Home                                      Prior Living Arrangements/Services Living arrangements for the past 2 months: Single Family Home Lives with:: Self          Need for Family Participation in Patient Care: Yes (Comment) Care giver support system in place?: Yes (comment)      Activities of Daily Living Home Assistive Devices/Equipment: CBG Meter ADL Screening (condition at time of admission) Patient's cognitive ability adequate to safely complete daily activities?: Yes Is the patient deaf or have difficulty hearing?: No Does the patient have difficulty seeing, even when wearing glasses/contacts?: No Does the patient have difficulty concentrating, remembering, or making decisions?: No Patient able to express need for assistance with ADLs?: Yes Does the patient have difficulty dressing or bathing?: No Independently performs ADLs?: Yes (appropriate for  developmental age) Does the patient have difficulty walking or climbing stairs?: No Weakness of Legs: None Weakness of Arms/Hands: None                   Emotional Assessment   Attitude/Demeanor/Rapport: Unable to Assess Affect (typically observed): Unable to Assess Orientation: : Oriented to Self      Admission diagnosis:  Hyperglycemia [R73.9] CVA (cerebral vascular accident) ([place]) [I63.9] Near syncope [R55] Cerebrovascular accident (CVA), unspecified mechanism ([place]) [I63.9] Patient Active Problem List   Diagnosis Date Noted   Pulmonary edema 03/31/2022   Acute respiratory failure ([place]) 03/29/2022   Aspiration pneumonia ([place]) 03/29/2022   CVA (cerebral vascular accident) ([place]) 03/23/2022   Lymphadenopathy of head and neck 03/25/2019   CAD (coronary artery disease) 07/24/2018   Abdominal aortic aneurysm (AAA) without rupture ([place]) 07/24/2018   Abnormal uterine bleeding 05/22/2018   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 ([place]) 02/22/2018   Gingivitis 10/24/2017   Dysphagia due to recent stroke 01/16/2017   GERD (gastroesophageal reflux disease) 06/01/2015   Normocytic anemia 06/01/2015   Healthcare maintenance 06/01/2015   Vitamin D deficiency 06/01/2015   Stage 3b chronic kidney disease (CKD) ([place]) 03/01/2014   PVD (peripheral vascular disease) ([place]) 06/18/2013   Hypothyroidism 01/10/2007   HLD (hyperlipidemia) 01/10/2007   HTN (hypertension) 01/10/2007   History of CVA (cerebrovascular accident) 01/10/2007   DM2 (diabetes mellitus, type 2) ([place]) 01/10/1989  PCP:  Buzzy Han, MD (Inactive) Pharmacy:   [id number] [place], [id number]: [phone]: [phone]    Social Determinants of Health (SDOH) Interventions    Readmission Risk Interventions     View : No data to display.         JReinaldo Raddle  RN, BSN  Trauma/Neuro ICU Case Manager [phone]

## 2022-03-31 NOTE — Progress Notes (Signed)
Pt laid on left side in preparation for enema, pt became bradycardic and asystole. Code blue was initial, chest compression started and CCM at bedside. ROSC was achieved in less than a minute, pt then had seizure like activity for approximately 1-2 minutes. Precedex and TF d/c'ed, neurology and family notified. See new orders.

## 2022-03-31 NOTE — Progress Notes (Addendum)
STROKE TEAM PROGRESS NOTE   INTERVAL HISTORY Daughter and sister are at the bedside. Patient has been hemodynamically stable and her neurological exam is unchanged.  She is still intubated on Precedex.  Plan for extubating trial early next week, if fails, may need trach and PEG.  Early in afternoon, patient was put on the left decubitus position for fleet enema due to constipation, however she developed asystolic, chest compressions started, but ROSC within a minute.  She then was observed to have seizure-like activity in 1 to 2 minutes, more like convulsive syncope.  Precedex was discontinued.  Patient was agitated due to discomfort of ET tube, I had a long discussion with patient, daughter and sister at bedside regarding the importance of intubation and possible trach next week.  Patient then eased off.  Will consider fentanyl as needed bolus.  Vitals:   03/31/22 1400 03/31/22 1500 03/31/22 1600 03/31/22 1700  BP: 99/80 (!) 123/45 (!) 147/50 (!) 182/66  Pulse: (!) 52   69  Resp: '20 20 20 20  '$ Temp:   97.9 F (36.6 C)   TempSrc:   Axillary   SpO2: 100%  99% 100%  Weight:      Height:       CBC:  Recent Labs  Lab 03/30/22 0344 03/31/22 0940  WBC 4.9 6.4  HGB 8.4* 8.7*  HCT 26.3* 27.3*  MCV 90.4 91.0  PLT 253 299   Basic Metabolic Panel:  Recent Labs  Lab 03/30/22 0344 03/31/22 0940  NA 145 144  K 3.7 3.7  CL 113* 108  CO2 23 24  GLUCOSE 210* 154*  BUN 44* 34*  CREATININE 1.54* 1.68*  CALCIUM 8.9 9.0  MG 2.6* 2.4  PHOS 3.2 4.1   Lipid Panel:  No results for input(s): CHOL, TRIG, HDL, CHOLHDL, VLDL, LDLCALC in the last 168 hours. HgbA1c:  No results for input(s): HGBA1C in the last 168 hours. Urine Drug Screen: No results for input(s): LABOPIA, COCAINSCRNUR, LABBENZ, AMPHETMU, THCU, LABBARB in the last 168 hours.  Alcohol Level No results for input(s): ETH in the last 168 hours.  IMAGING past 24 hours DG Chest Port 1 View  Result Date: 03/31/2022 CLINICAL DATA:   Advancement of endotracheal tube. EXAM: PORTABLE CHEST 1 VIEW COMPARISON:  Earlier today at 7:25 a.m. FINDINGS: 849 hours. Endotracheal tube has been advanced and terminates 3.1 cm above carina. Nasogastric tube extends beyond the inferior aspect of the film. Loop recorder or wireless pacer projecting over the left side of the chest. Normal heart size for level of inspiration. No pleural effusion or pneumothorax. Lung volumes are low. Persistent left greater than right base airspace disease. IMPRESSION: Appropriate position of endotracheal tube. Otherwise, no significant change in low lung volumes with bibasilar airspace disease, favoring atelectasis. Electronically Signed   By: Abigail Miyamoto M.D.   On: 03/31/2022 09:15   DG Chest Port 1 View  Addendum Date: 03/31/2022   ADDENDUM REPORT: 03/31/2022 07:25 ADDENDUM: Study discussed by telephone with Dr. Oletta Darter on 03/31/2022 at 0656 hours. Electronically Signed   By: Genevie Ann M.D.   On: 03/31/2022 07:25   Result Date: 03/31/2022 CLINICAL DATA:  71 year old female with respiratory failure. EXAM: PORTABLE CHEST 1 VIEW COMPARISON:  Portable chest 03/30/2022 and earlier. FINDINGS: Portable AP semi upright view at 0531 hours. Endotracheal tube tip is now above the level of the clavicles, approximately 7 cm from the carina. Enteric tube terminates in the stomach, side hole at the level of the gastric fundus. Mildly  improved lung volumes and improved left lung base ventilation with regressed opacity and air bronchograms. Streaky residual peribronchial opacity in both lower lungs. Normal cardiac size and mediastinal contours. No pneumothorax or pulmonary edema. No definite pleural effusion. Left chest cardiac event recorder or lead less ICD. Barium type contrast at the splenic flexure. Stable visualized osseous structures. IMPRESSION: 1. ETT tip now above the clavicles. Recommend advancing 3 cm with repeat portable chest x-ray to confirm positioning. 2. Regressed left lung  base collapse or consolidation since yesterday. Improving bibasilar ventilation. Electronically Signed: By: Genevie Ann M.D. On: 03/31/2022 06:43    PHYSICAL EXAM General:  Alert, well-developed, well-nourished, intubated elderly lady in no acute distress Respiratory:  Respirations synchronous with ventilator  NEURO:  Awake alert, on vent, able to follow commands and communicate by nodding and gestures.  Endorses continued diplopia  PERRL  Unable to abduct left eye completely.  Tongue midline, able to follow commands with all 4 extremities.    ASSESSMENT/PLAN Ms. Collins Dimaria is a 71 y.o. 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  MRI reveals a left medullary stroke.  On 5/14, patient was intubated due to respiratory distress.  Will try to extubate over the weekend, plan for trach/PEG if unsuccessful.  Stroke:  left medulalry infarct likely secondary large vessel disease of left V4 high grade stenosis vs. Short segmental occlusion MRI  Small acute to early subacute infarct in left aspect of the medulla, chronic white matter microangiopathy MRA head and neck  high grade narrowing of left V4 segment, moderate narrowing of bilateral MCA Repeat MRI 5/13 stable left medullary infarct 2D Echo EF 86-57%, grade 1 diastolic dysfunction, no atrial level shunt Loop recorder interrogation no A-fib LDL 142 HgbA1c 9.5 VTE prophylaxis - lovenox On Plavix and pletal prior to admission, now on aspirin 325 mg per tube.  Will hold off on Brillinta given likely need for trach/PEG.  Therapy recommendations:  CIR Disposition:  pending  Respiratory failure Aspiration  Pt choked on saliva  Likely aspirated, on unasyn  CCM on board Emergent intubated 5/14 CCM plan for trial of extubation, if fails, will consider trach   Hx of stroke First one in 2004 and on  plavix 01/2011 right CC stroke with left sided weakness, balance issue and slurry speech - residue of walking with hopping on the left side - on plavix and pletal 02/2018 admitted for left-sided weakness, left upper extremity muscle spasm, headache.  MRI showed right CR infarct.  MRA and carotid Doppler negative.  EF 60 to 65%.  LDL 87, A1c 8.9.  Discharged on Plavix and Pletal and Lopid.  Loop recorder was placed after discharge.  Cardio arrest x 2 5/14 patient choked on saliva -> desaturation-> bradycardia-> asystole-> CPR->ROSC in 2 to 3 minutes 5/19 left decubitus positioning -> asystole -> CPR -> ROSC within 1 minute -> followed with seizure-like activity but more consistent with convulsive syncope  Hypertension Home meds:  amlodipine 10 mg daily, losartan-HCTZ 100-25 mg daily Stable Long-term BP goal normotensive  Hyperlipidemia Home meds:  gemfibrozil 600 mg daily, resumed in hospital LDL 142, goal < 70 High intensity statin not indicated due to previous intolerance Consider lipid clinic referral  Diabetes type II Uncontrolled Home meds:  lantus insulin 26 units BID HgbA1c 9.5, goal < 7.0 Now on insulin CBGs SSI Close PCP follow up for better DM control.  Dysphagia Status post cortrak -> nosebleed -> cortrak removed Has OG tube now Will try for Cortrak again today On TF May need PEG later  Other Stroke Risk Factors Advanced Age >/= 67  Former cigarette smoker Hx stroke  AKI on CKD3 Cre 1.38-1.46-1.99-1.72-1.68 CCM on board Avoid contrast when possible Renally dose medications as appropriate  Left chest wall pain EKG no ST-T changes Chest x-ray unremarkable Likely related to chest compression on 5/14  B/l LE numbness Bilateral LE venous Doppler negative for DVT Supportive care  Other Active Problems Hypothyroidism, continue home synthroid Constipation  Hospital day # 8  I had long discussion with daughter and sister at bedside, updated pt current  condition, treatment plan and potential prognosis, and answered all the questions.  They expressed understanding and appreciation.  I also discussed with Dr. [name] CCM  [name], MD PhD Stroke Neurology 03/31/2022 7:19 PM  This patient is critically ill due to brainstem stroke, dysphagia, dysarthria, respiratory failure, cardiac rest and at significant risk of neurological worsening, death form recurrent stroke, heart failure, respiratory failure. This patient's care requires constant monitoring of vital signs, hemodynamics, respiratory and cardiac monitoring, review of multiple databases, neurological assessment, discussion with family, other specialists and medical decision making of high complexity. I spent 40 minutes of neurocritical care time in the care of this patient.    To contact Stroke Continuity provider, please refer to [email or web address] After hours, contact General Neurology

## 2022-03-31 NOTE — Progress Notes (Signed)
This afternoon while 2 nurses were int he room rolling and repositioning [name] she developed progressive bradycardia 50>40>30s>asystole. ROSC before compressions, then agitated repetitive movements. Awake again with stimulation within about 1 minute, able to follow commands and track with her eyes.   Precedex stopped. Switching sedation to fentanyl PRN. Holding scheduled enema now due to concern for recurrent vagal response. D/w Dr. Erlinda Hong.  [name], DO 03/31/22 2:07 PM [facility]

## 2022-03-31 NOTE — Progress Notes (Addendum)
NAME:  [name], MRN:  [id number], DOB:  October 28, 1951, LOS: 8 ADMISSION DATE:  03/23/2022, CONSULTATION DATE: 03/25/2022 REFERRING MD:  Jonetta Osgood, MD , CHIEF COMPLAINT: Increasing shortness of breath  History of Present Illness:  71 year old female with poorly controlled diabetes, hypertension, CKD stage IIIb and prior history of CVA who presented with vertigo and progressive dysphagia, she was noted to have left medullary acute stroke, was admitted to hospital service for further management, stroke team is following.  This afternoon patient started with increasing dysphagia and unable to protect her airway, she required frequent suctioning, she became hypoxic and her oxygen requirement went up, now she is on Ventimask, PCCM was consulted for help evaluation and management  Pertinent  Medical History   Past Medical History:  Diagnosis Date   Anemia    Arteriosclerotic cardiovascular disease    CKD (chronic kidney disease) stage 3, GFR 30-59 ml/min (HCC)    Claudication in peripheral vascular disease ([place])    CVA (cerebral vascular accident) ([place])    2012 and 2019-affected left side + dysarthria 2/2 severe R ACA and MCA stenosis   Epistaxis 03/29/2022   GERD (gastroesophageal reflux disease)    Hyperlipidemia    declines statins   Hypertension    Hypothyroidism    Type 2 diabetes mellitus ([place]) 2000   Vitamin D deficiency      Significant Hospital Events: Including procedures, antibiotic start and stop dates in addition to other pertinent events   5/11 admitted 5/14 intubated for cardiac arrest, likely 2/2 aspiration vs hypoxia, Unasyn started 5/17 awake. Following commands. Sig difficulty w/ glycemic control. Adjustments made to basal and ss dosing 5/18 awake. VTs low on PSV. Failed initial am SBT (sats 60s; reporting SOB). Got SMPG still no BM 5/19 pulmonary edema on cxr getting lasix. SSE for constipation   Interim History / Subjective:  ETT out a little.  Adjusting now.  Still no BM C/o left sided chest wall discomfort still Tmax 98.9  Objective   Blood pressure (Abnormal) 118/52, pulse (Abnormal) 55, temperature 98.9 F (37.2 C), temperature source Axillary, resp. rate 20, height '5\' 2"'$  (1.575 m), weight 74.8 kg, SpO2 100 %.    Vent Mode: PRVC FiO2 (%):  [30 %-40 %] 30 % Set Rate:  [20 bmp] 20 bmp Vt Set:  [400 mL] 400 mL PEEP:  [5 cmH20] 5 cmH20 Pressure Support:  [5 cmH20] 5 cmH20 Plateau Pressure:  [13 cmH20-15 cmH20] 13 cmH20   Intake/Output Summary (Last 24 hours) at 03/31/2022 0803 Last data filed at 03/31/2022 0600 Gross per 24 hour  Intake 535.65 ml  Output 1000 ml  Net -464.35 ml   Filed Weights   03/25/22 0141 03/27/22 0500 03/30/22 0500  Weight: 74.9 kg 74.2 kg 74.8 kg    Examination: General 71 year old female restless this am  HENT NCAT no JVD orally intubated.  Pulm clear. Dec bases Card rrr Abd soft Ext warm  Neuro awake. Restless but following commands. No clear focal motor def  Gu cl yellow   Resolved Hospital Problem list   Epistaxis  Assessment & Plan:  Principal Problem:   CVA (cerebral vascular accident) ([place]) Active Problems:   Acute respiratory failure ([place])   Aspiration pneumonia ([place])   Pulmonary edema   Dysphagia due to recent stroke   Stage 3b chronic kidney disease (CKD) (HCC)   HTN (hypertension)   CAD (coronary artery disease)   Hypothyroidism   DM2 (diabetes mellitus, type 2) ([place])  HLD (hyperlipidemia)   GERD (gastroesophageal reflux disease)   Normocytic anemia    Acute left medullary stroke Progressive dysphagia and dysphonia due to stroke Plan Asa Plavix after PEG/trach  PT/OT/ rehab efforts   Acute hypoxic respiratory failure due to aspiration pneumonia requiring MV Pulmonary edema on cxr Plan Cont full vent support w/ PSV trial later today  Advancing ETT (above clavicals this am) Day 7 of 7 abx (stop unasyn after today) Lasix today Am cxr Rx  constipation Looking towards Sunday extubation and trach if fails   Constipation Still w. No BM. Got SMOG yesterday  Plan Cont daily Miralax Cont senna Try SSE  Hypertension -PTA amlodipine, Losartan/HCTZ Plan PRL labetolol >180  Hyperlipidemia -trial of high intensity statin- crestor '10mg'$  daily. Has previous intolerance with rash & nausea as OP Plan Gemfibrozil  Poorly controlled diabetes with hyperglycemia-> Increased TF coverage yesterday. She is on Semglee daily; glycemic control still very poor Plan Cont TF coverage at 8 units q4 Cont ssi No change in Semglee at 25 u daily  AKI on CKD stage IIIb -BUN/cr relatively stable Plan Trend Renal dose med Am chems  Fluid and Electrolyte imbalance: hyperchloremia & fluid volume overload -3.8 liters + Plan Lasix today Am cxr and chem  Hypothyroidism Plan Synthroid  At risk of malnutrition -has OGT currently. Not going to replace Nasogastric access as had severe epistaxis Plan Cont TFs, if successfully extubated->swallow eval->if fails PEG  Anemia  Slow trend down. Seems to have leveled off.  Plan trend  Best Practice (right click and "Reselect all SmartList Selections" daily)   Diet/type: tubefeeds DVT prophylaxis: LMWH GI prophylaxis: H2B Lines: N/A Foley:  N/A Code Status:  full code Last date of multidisciplinary goals of care discussion [5/15, goals of care discussions are ongoing, updated patient, sister at bedside, daughter via phone during rounds.   Critical care time: 32 min    Erick Colace ACNP-BC [place] Pager # [phone] OR # [phone] if no answer   Attending attestation: [name] is a 71 y/o woman with a history of DM, HTN who presented with acute medullary CVA complicated by respiratory failure due to aspiration. Intubated 5/14. She has had agitation from her ETT.  BP (!) 135/56   Pulse (!) 53   Temp 98.4 F (36.9 C) (Axillary)   Resp 20   Ht '5\' 2"'$   (1.575 m)   Wt 74.8 kg   SpO2 100%   BMI 30.16 kg/m  Elderly woman lying in bed in NAD [place]/AT, eyes anicteric Breathing comfortably on MV, CTAB. Mild thick secretions from ETT. S1S2, RRR Abd soft, NT No peripheral edema, no cyanosis Skin warm, dry, no rashes.  WBC 6.4 H/H 8.7/27.3 BUN 34 Cr 1.68  CXR personally reviewed> ETT re-avanced, now 2-3cm above carina.  Assessment & plan: Acute respiratory failure with hypoxia requiring MV Aspiration pneumonia RLL Dysphagia from CVA -LTVV -VAP prevention protocol -extubation trial in the coming days; if she fails she will need a trach. Family understands. -daily SAT & SBT -unasyn x 7 days  Medullary CVA with dysphagia and dysarthria -PT, OT -will consult SLP after extubation -ASA, statin -anticipate she will need trach and PEG; holding plavix for now  Constipation -soap suds enema today -con't senna, miralax  Hyperglycemia -glargine 25 units daily -SSI PRN -8 units Q4h TF coverage  HLD -trial of statin; previously had been intolerant -gemfibrozil  At risk of malnutrition -TF; has tolerated well this week -anticipate need for PEG  Hypothyroidism -  synthroid  Mother in law updated at bedside today with patient.  This patient is critically ill with multiple organ system failure which requires frequent high complexity decision making, assessment, support, evaluation, and titration of therapies. This was completed through the application of advanced monitoring technologies and extensive interpretation of multiple databases. During this encounter critical care time was devoted to patient care services described in this note for 33 minutes.  [name], DO 03/31/22 10:48 AM [facility]

## 2022-04-01 ENCOUNTER — Inpatient Hospital Stay (HOSPITAL_COMMUNITY): Payer: Medicare Other

## 2022-04-01 DIAGNOSIS — R061 Stridor: Secondary | ICD-10-CM | POA: Diagnosis not present

## 2022-04-01 DIAGNOSIS — Z9911 Dependence on respirator [ventilator] status: Secondary | ICD-10-CM | POA: Diagnosis not present

## 2022-04-01 DIAGNOSIS — I63212 Cerebral infarction due to unspecified occlusion or stenosis of left vertebral arteries: Secondary | ICD-10-CM | POA: Diagnosis not present

## 2022-04-01 DIAGNOSIS — I469 Cardiac arrest, cause unspecified: Secondary | ICD-10-CM | POA: Diagnosis not present

## 2022-04-01 DIAGNOSIS — J9601 Acute respiratory failure with hypoxia: Secondary | ICD-10-CM | POA: Diagnosis not present

## 2022-04-01 LAB — POCT I-STAT 7, (LYTES, BLD GAS, ICA,H+H)
Acid-Base Excess: 2 mmol/L (ref 0.0–2.0)
Bicarbonate: 27 mmol/L (ref 20.0–28.0)
Calcium, Ion: 1.19 mmol/L (ref 1.15–1.40)
HCT: 28 % — ABNORMAL LOW (ref 36.0–46.0)
Hemoglobin: 9.5 g/dL — ABNORMAL LOW (ref 12.0–15.0)
O2 Saturation: 98 %
Potassium: 4.9 mmol/L (ref 3.5–5.1)
Sodium: 144 mmol/L (ref 135–145)
TCO2: 28 mmol/L (ref 22–32)
pCO2 arterial: 45.8 mmHg (ref 32–48)
pH, Arterial: 7.379 (ref 7.35–7.45)
pO2, Arterial: 105 mmHg (ref 83–108)

## 2022-04-01 LAB — BASIC METABOLIC PANEL
Anion gap: 11 (ref 5–15)
BUN: 41 mg/dL — ABNORMAL HIGH (ref 8–23)
CO2: 27 mmol/L (ref 22–32)
Calcium: 9.2 mg/dL (ref 8.9–10.3)
Chloride: 109 mmol/L (ref 98–111)
Creatinine, Ser: 1.89 mg/dL — ABNORMAL HIGH (ref 0.44–1.00)
GFR, Estimated: 28 mL/min — ABNORMAL LOW (ref >60)
Glucose, Bld: 83 mg/dL (ref 70–99)
Potassium: 3.8 mmol/L (ref 3.5–5.1)
Sodium: 147 mmol/L — ABNORMAL HIGH (ref 135–145)

## 2022-04-01 LAB — GLUCOSE, CAPILLARY
Glucose-Capillary: 103 mg/dL — ABNORMAL HIGH (ref 70–99)
Glucose-Capillary: 143 mg/dL — ABNORMAL HIGH (ref 70–99)
Glucose-Capillary: 163 mg/dL — ABNORMAL HIGH (ref 70–99)
Glucose-Capillary: 194 mg/dL — ABNORMAL HIGH (ref 70–99)
Glucose-Capillary: 217 mg/dL — ABNORMAL HIGH (ref 70–99)
Glucose-Capillary: 71 mg/dL (ref 70–99)
Glucose-Capillary: 83 mg/dL (ref 70–99)

## 2022-04-01 LAB — CBC
HCT: 30 % — ABNORMAL LOW (ref 36.0–46.0)
Hemoglobin: 9.6 g/dL — ABNORMAL LOW (ref 12.0–15.0)
MCH: 28.8 pg (ref 26.0–34.0)
MCHC: 32 g/dL (ref 30.0–36.0)
MCV: 90.1 fL (ref 80.0–100.0)
Platelets: 298 10*3/uL (ref 150–400)
RBC: 3.33 MIL/uL — ABNORMAL LOW (ref 3.87–5.11)
RDW: 14.1 % (ref 11.5–15.5)
WBC: 8.9 10*3/uL (ref 4.0–10.5)
nRBC: 0.3 % — ABNORMAL HIGH (ref 0.0–0.2)

## 2022-04-01 LAB — BRAIN NATRIURETIC PEPTIDE: B Natriuretic Peptide: 81.4 pg/mL (ref 0.0–100.0)

## 2022-04-01 MED ORDER — DEXTROSE 5 % IV SOLN: INTRAVENOUS | Status: DC

## 2022-04-01 MED ORDER — FENTANYL CITRATE PF 50 MCG/ML IJ SOSY
100.0000 ug | PREFILLED_SYRINGE | Freq: Once | INTRAMUSCULAR | Status: AC
  Administered 2022-04-01: 100 ug via INTRAVENOUS

## 2022-04-01 MED ORDER — MIDAZOLAM HCL 2 MG/2ML IJ SOLN
4.0000 mg | Freq: Once | INTRAMUSCULAR | Status: AC
  Administered 2022-04-01: 4 mg via INTRAVENOUS

## 2022-04-01 MED ORDER — RACEPINEPHRINE HCL 2.25 % IN NEBU
0.5000 mL | INHALATION_SOLUTION | RESPIRATORY_TRACT | Status: DC | PRN
Start: 2022-04-01 — End: 2022-04-02
  Administered 2022-04-01: 0.5 mL via RESPIRATORY_TRACT
  Filled 2022-04-01: qty 0.5

## 2022-04-01 MED ORDER — ROCURONIUM BROMIDE 10 MG/ML (PF) SYRINGE
PREFILLED_SYRINGE | INTRAVENOUS | Status: AC
Start: 2022-04-01 — End: 2022-04-01
  Filled 2022-04-01: qty 10

## 2022-04-01 MED ORDER — GLYCOPYRROLATE 0.2 MG/ML IJ SOLN
INTRAMUSCULAR | Status: AC
  Administered 2022-04-01: 0.1 mg via INTRAVENOUS
  Filled 2022-04-01: qty 1

## 2022-04-01 MED ORDER — ROCURONIUM BROMIDE 50 MG/5ML IV SOLN
70.0000 mg | Freq: Once | INTRAVENOUS | Status: AC
  Administered 2022-04-01: 70 mg via INTRAVENOUS

## 2022-04-01 MED ORDER — GLYCOPYRROLATE 0.2 MG/ML IJ SOLN: 0.1000 mg | Freq: Three times a day (TID) | INTRAMUSCULAR | Status: DC | PRN

## 2022-04-01 MED ORDER — FREE WATER
200.0000 mL | Status: DC
  Administered 2022-04-01 – 2022-04-02 (×18): 200 mL

## 2022-04-01 MED ORDER — METHYLPREDNISOLONE SODIUM SUCC 125 MG IJ SOLR
125.0000 mg | INTRAMUSCULAR | Status: AC
  Administered 2022-04-01: 125 mg via INTRAVENOUS
  Filled 2022-04-01: qty 2

## 2022-04-01 MED ORDER — THIAMINE HCL 100 MG/ML IJ SOLN
100.0000 mg | Freq: Every day | INTRAMUSCULAR | Status: AC
  Administered 2022-04-01 – 2022-04-03 (×3): 100 mg via INTRAVENOUS
  Filled 2022-04-01 (×3): qty 2

## 2022-04-01 MED ORDER — INSULIN GLARGINE-YFGN 100 UNIT/ML ~~LOC~~ SOLN
15.0000 [IU] | Freq: Every day | SUBCUTANEOUS | Status: DC
  Administered 2022-04-01 – 2022-04-07 (×6): 15 [IU] via SUBCUTANEOUS
  Filled 2022-04-01 (×8): qty 0.15

## 2022-04-01 MED ORDER — LACTULOSE 10 GM/15ML PO SOLN
10.0000 g | Freq: Once | ORAL | Status: AC
  Administered 2022-04-01: 10 g
  Filled 2022-04-01: qty 15

## 2022-04-01 NOTE — Procedures (Signed)
Extubation Procedure Note  Patient Details:   Name: Almas Rake DOB: 06-27-1951 MRN: [id number]   Airway Documentation:    Vent end date: 04/01/22 Vent end time: 1005   Evaluation  O2 sats: stable throughout Complications: No apparent complications Patient did tolerate procedure well. Bilateral Breath Sounds: Clear   Yes  Pt was extubated and placed on RA. Pt had cuff leak prior to extubation and no stridor post. Pt is refusing to wear [place] at this time but saturating 99%. Pt is stable. Rt will monitor.   Ronaldo Miyamoto 04/01/2022, 10:08 AM

## 2022-04-01 NOTE — Progress Notes (Signed)
PT Cancellation Note  Patient Details Name: [name] MRN: [id number] DOB: 1951-04-30   Cancelled Treatment:    Reason Eval/Treat Not Completed: Medical issues which prohibited therapy. Pt extubated and requiring emergent reintubation minutes later. PT will plan to follow up Monday.   Zenaida Niece 04/01/2022, 11:59 AM

## 2022-04-01 NOTE — Procedures (Signed)
Intubation Procedure Note  [name]  [id number]  [birth year]  Date:04/01/22  Time:11:49 AM   Provider Performing:[name]    Procedure: Intubation (88502)  Indication(s) Respiratory Failure  Consent Unable to obtain consent due to emergent nature of procedure.   Anesthesia Versed, Fentanyl, and Rocuronium   Time Out Verified patient identification, verified procedure, site/side was marked, verified correct patient position, special equipment/implants available, medications/allergies/relevant history reviewed, required imaging and test results available.   Sterile Technique Usual hand hygeine, masks, and gloves were used   Procedure Description Patient positioned in bed supine.  Sedation given as noted above.  Patient was intubated with endotracheal tube using Glidescope.  View was Grade 1 full glottis .  Number of attempts was 1.  Colorimetric CO2 detector was consistent with tracheal placement.   Complications/Tolerance None; patient tolerated the procedure well. Chest X-ray is ordered to verify placement.   EBL Minimal   Specimen(s) None  [name] Hy, DO 04/01/22 11:50 AM [facility]

## 2022-04-01 NOTE — Progress Notes (Signed)
NAME:  [name], MRN:  [id number], DOB:  Apr 21, 1951, LOS: 9 ADMISSION DATE:  03/23/2022, CONSULTATION DATE: 03/25/2022 REFERRING MD:  [name] Osgood, MD , CHIEF COMPLAINT: Increasing shortness of breath  History of Present Illness:  71 year old female with poorly controlled diabetes, hypertension, CKD stage IIIb and prior history of CVA who presented with vertigo and progressive dysphagia, she was noted to have left medullary acute stroke, was admitted to hospital service for further management, stroke team is following.  This afternoon patient started with increasing dysphagia and unable to protect her airway, she required frequent suctioning, she became hypoxic and her oxygen requirement went up, now she is on Ventimask, PCCM was consulted for help evaluation and management  Pertinent  Medical History   Past Medical History:  Diagnosis Date   Anemia    Arteriosclerotic cardiovascular disease    CKD (chronic kidney disease) stage 3, GFR 30-59 ml/min (HCC)    Claudication in peripheral vascular disease ([place])    CVA (cerebral vascular accident) ([place])    2012 and 2019-affected left side + dysarthria 2/2 severe R ACA and MCA stenosis   Epistaxis 03/29/2022   GERD (gastroesophageal reflux disease)    Hyperlipidemia    declines statins   Hypertension    Hypothyroidism    Type 2 diabetes mellitus ([place]) 2000   Vitamin D deficiency      Significant Hospital Events: Including procedures, antibiotic start and stop dates in addition to other pertinent events   5/11 admitted 5/14 intubated for cardiac arrest, likely 2/2 aspiration vs hypoxia, Unasyn started 5/17 awake. Following commands. Sig difficulty w/ glycemic control. Adjustments made to basal and ss dosing 5/18 awake. VTs low on PSV. Failed initial am SBT (sats 60s; reporting SOB). Got SMPG still no BM 5/19 pulmonary edema on cxr getting lasix. SSE for constipation   Interim History / Subjective:  [name] dislodged her  ETT again last night. She denies complaints other than discomfort from ETT.  Objective   Blood pressure (!) 144/68, pulse 92, temperature 99.9 F (37.7 C), temperature source Oral, resp. rate 20, height '5\' 2"'$  (1.575 m), weight 74.8 kg, SpO2 100 %.    Vent Mode: PRVC FiO2 (%):  [30 %] 30 % Set Rate:  [20 bmp] 20 bmp Vt Set:  [400 mL] 400 mL PEEP:  [5 cmH20] 5 cmH20 Pressure Support:  [10 cmH20] 10 cmH20 Plateau Pressure:  [15 cmH20-16 cmH20] 15 cmH20   Intake/Output Summary (Last 24 hours) at 04/01/2022 0705 Last data filed at 04/01/2022 8841 Gross per 24 hour  Intake 640.69 ml  Output 1150 ml  Net -509.31 ml    Filed Weights   03/25/22 0141 03/27/22 0500 03/30/22 0500  Weight: 74.9 kg 74.2 kg 74.8 kg    Examination: General elderly woman lying in bed in no acute distress, intubated, lightly sedated HENT [name]/AT, eyes anicteric, endotracheal tube in place.  Copious clear oral secretions. Pulm: Breathing comfortably on pressure support 8, CPAP 5.  No rhonchi.  Minimal endotracheal secretions. Card S1-S2, tachycardic, regular rhythm Abd soft, nontender Ext no peripheral edema, no cyanosis Neuro RASS -1, moving all extremities spontaneously.  Swallow still appears uncoordinated Derm: Skin warm, dry, no rashes  NA+ 147 K+3.8 BUN 41 Cr 1.89 BNP 81  Resolved Hospital Problem list   Epistaxis  Assessment & Plan:  Principal Problem:   CVA (cerebral vascular accident) ([place]) Active Problems:   Hypothyroidism   DM2 (diabetes mellitus, type 2) ([place])   HLD (hyperlipidemia)  HTN (hypertension)   Stage 3b chronic kidney disease (CKD) (HCC)   GERD (gastroesophageal reflux disease)   Normocytic anemia   Dysphagia due to recent stroke   CAD (coronary artery disease)   Acute respiratory failure (HCC)   Aspiration pneumonia (HCC)   Pulmonary edema   Acute respiratory failure with hypoxia requiring MV Aspiration pneumonia RLL Dysphagia from CVA -Low tidal volume  ventilation - Daily SAT and SBT-likely trial of extubation today.  If she fails she will require tracheostomy. -PAD protocol for sedation.  Cannot tolerate Precedex, previously had bradycardic/ asystolic arrest -VAP prevention protocol - Complete 7 days of Unasyn today   Medullary CVA with dysphagia and dysarthria -PT, OT - We will need SLP after extubation or tracheostomy - Daily aspirin and statin - Holding Plavix until it is determined if trach and PEG are needed -Glycopyrrolate to help with oral secretions  Constipation -Senna, MiraLAX, lactulose   Hyperglycemia, resolved -glargine decreased to 15 units daily while tube feeds are held -SSI PRN -Tube feeding coverage if tube feeds resumed   AKI on CKD 3b -Renally dose meds, avoid nephrotoxic meds - Strict I's/O - Monitor  Hypernatremia - Free water flushes via tube - D5 after extubation; IV thiamine if no enteral access while D5 is running -Monitor  HLD -Seems to be tolerating Crestor, previously intolerant to statins as an outpatient with mild reaction - Continue daily gemfibrozil   Anemia -Transfuse for hemoglobin less than 7 or hemodynamically significant bleeding  At risk of malnutrition -Has tolerated tube feeds well all week.  If she requires reintubation she will require PEG   Hypothyroidism -Continue Synthroid    Best Practice (right click and "Reselect all SmartList Selections" daily)   Diet/type: tubefeeds DVT prophylaxis: LMWH GI prophylaxis: H2B Lines: N/A Foley:  N/A Code Status:  full code Last date of multidisciplinary goals of care discussion [5/15, goals of care discussions are ongoing, updated patient, sister at bedside, daughter via phone during rounds.   Critical care time:      This patient is critically ill with multiple organ system failure which requires frequent high complexity decision making, assessment, support, evaluation, and titration of therapies. This was completed  through the application of advanced monitoring technologies and extensive interpretation of multiple databases. During this encounter critical care time was devoted to patient care services described in this note for 36 minutes.  [name], DO 04/01/22 8:52 AM [facility]

## 2022-04-01 NOTE — Progress Notes (Signed)
[name] passed SBT> was extubated to nasal cannula. A few minutes later she developed mild stridor, still saturating well on [place]. Racemic epi and steroids were given. Despite this she developed increased SOB and began using accessory muscles. She was emergently reintubated. I updated her daughter [name] via phone. She will need trach and PEG this week.  [name], DO 04/01/22 11:55 AM [facility]

## 2022-04-01 NOTE — Progress Notes (Signed)
STROKE TEAM PROGRESS NOTE   INTERVAL HISTORY intubated this am. On SBT this am.  Still with secretions per RN. extubated later in am but required reintubation shortly after.  Will  need trach and PEG.   Vitals:   04/01/22 1300 04/01/22 1400 04/01/22 1448 04/01/22 1500  BP: 110/63 (!) 149/68 (!) 149/68 (!) 179/86  Pulse:  90 91 97  Resp: '20 20 20 '$ (!) 23  Temp:      TempSrc:      SpO2:  100% 100% 100%  Weight:      Height:       CBC:  Recent Labs  Lab 03/31/22 0940 04/01/22 0052 04/01/22 1255  WBC 6.4 8.9  --   HGB 8.7* 9.6* 9.5*  HCT 27.3* 30.0* 28.0*  MCV 91.0 90.1  --   PLT 254 298  --     Basic Metabolic Panel:  Recent Labs  Lab 03/30/22 0344 03/31/22 0940 04/01/22 0052 04/01/22 1255  NA 145 144 147* 144  K 3.7 3.7 3.8 4.9  CL 113* 108 109  --   CO2 '23 24 27  '$ --   GLUCOSE 210* 154* 83  --   BUN 44* 34* 41*  --   CREATININE 1.54* 1.68* 1.89*  --   CALCIUM 8.9 9.0 9.2  --   MG 2.6* 2.4  --   --   PHOS 3.2 4.1  --   --     Lipid Panel:  No results for input(s): CHOL, TRIG, HDL, CHOLHDL, VLDL, LDLCALC in the last 168 hours. HgbA1c:  No results for input(s): HGBA1C in the last 168 hours. Urine Drug Screen: No results for input(s): LABOPIA, COCAINSCRNUR, LABBENZ, AMPHETMU, THCU, LABBARB in the last 168 hours.  Alcohol Level No results for input(s): ETH in the last 168 hours.  IMAGING past 24 hours DG CHEST PORT 1 VIEW  Result Date: 04/01/2022 CLINICAL DATA:  Acute respiratory failure. Endotracheally intubated. Nasogastric tube placement. EXAM: PORTABLE CHEST 1 VIEW COMPARISON:  Prior today FINDINGS: Endotracheal tube remains in appropriate position. Nasogastric tube is seen with tip overlying the body of the stomach. The heart size and mediastinal contours are within normal limits. Aortic atherosclerotic calcification incidentally noted. Increased atelectasis or infiltrate is seen in the left lower lobe with silhouetting of the left hemidiaphragm. IMPRESSION:  Endotracheal tube and nasogastric tube in appropriate position. Increased left lower lobe atelectasis versus infiltrate. Electronically Signed   By: [name] M.D.   On: 04/01/2022 12:37   DG Chest Port 1 View  Result Date: 04/01/2022 CLINICAL DATA:  Pulmonary edema. EXAM: PORTABLE CHEST 1 VIEW COMPARISON:  Mar 31, 2022 FINDINGS: The ETT remains in good position, terminating above the carina. The NG tube terminates below today's film. No pneumothorax identified. No pulmonary edema. Left retrocardiac opacity not excluded based on this study. No other abnormalities. IMPRESSION: 1. Support apparatus as above. 2. No pulmonary edema. 3. Left retrocardiac opacity not excluded on this single view portable film. If there is clinical concern, recommend a PA and lateral chest x-ray. Otherwise, recommend attention on follow-up. Electronically Signed   By: [name] III M.D.   On: 04/01/2022 08:38    PHYSICAL EXAM General:  Alert, well-developed, well-nourished, intubated elderly lady in no acute distress Respiratory:  on SBT.  NEURO:  Awake alert, on vent, able to follow commands and   Nods yes/no to questions.PERRL. blink to threat b/l. Motor: non focal. Able to lift all ext off bed. No drift.  Sensory: intact  to LT throughout.    ASSESSMENT/PLAN [name] is a 71 y.o. 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  MRI reveals a left medullary stroke.  On 5/14, patient was intubated due to respiratory distress.    5/20: Extubated then required reintubation: plan for trach/PEG   Stroke:  left medulalry infarct likely secondary large vessel disease of left V4 high grade stenosis vs. Short segmental occlusion MRI  Small acute to early subacute infarct in left aspect of the medulla, chronic white matter microangiopathy MRA head and neck  high grade  narrowing of left V4 segment, moderate narrowing of bilateral MCA Repeat MRI 5/13 stable left medullary infarct 2D Echo EF 58-09%, grade 1 diastolic dysfunction, no atrial level shunt Loop recorder interrogation no A-fib LDL 142 HgbA1c 9.5 VTE prophylaxis - lovenox On Plavix and pletal prior to admission, now on aspirin 325 mg per tube.  Will hold off on Brillinta given need for trach/PEG.  Therapy recommendations:  CIR Disposition:  pending  Respiratory failure Aspiration  Pt choked on saliva  Likely aspirated, on unasyn  CCM on board Emergent intubated 5/14 CCM plan for trial of extubation failed today. Planning for trach   Hx of stroke First one in 2004 and on plavix 01/2011 right CC stroke with left sided weakness, balance issue and slurry speech - residue of walking with hopping on the left side - on plavix and pletal 02/2018 admitted for left-sided weakness, left upper extremity muscle spasm, headache.  MRI showed right CR infarct.  MRA and carotid Doppler negative.  EF 60 to 65%.  LDL 87, A1c 8.9.  Discharged on Plavix and Pletal and Lopid.  Loop recorder was placed after discharge.  Cardio arrest x 2 5/14 patient choked on saliva -> desaturation-> bradycardia-> asystole-> CPR->ROSC in 2 to 3 minutes 5/19 left decubitus positioning -> asystole -> CPR -> ROSC within 1 minute -> followed with seizure-like activity but more consistent with convulsive syncope  Hypertension Home meds:  amlodipine 10 mg daily, losartan-HCTZ 100-25 mg daily Stable Long-term BP goal normotensive  Hyperlipidemia Home meds:  gemfibrozil 600 mg daily, resumed in hospital LDL 142, goal < 70 High intensity statin not indicated due to previous intolerance Consider lipid clinic referral  Diabetes type II Uncontrolled Home meds:  lantus insulin 26 units BID HgbA1c 9.5, goal < 7.0 Now on insulin CBGs SSI Close PCP follow up for better DM control.  Dysphagia Status post cortrak -> nosebleed ->  cortrak removed Has OG tube now On TF PEG planned.  Other Stroke Risk Factors Advanced Age >/= 57  Former cigarette smoker Hx stroke  AKI on CKD3 Cre 1.38-1.46-1.99-1.72-1.68-1.89 CCM on board Avoid contrast when possible Renally dose medications as appropriate  Left chest wall pain EKG no ST-T changes Chest x-ray unremarkable Likely related to chest compression on 5/14  B/l LE numbness Bilateral LE venous Doppler negative for DVT Supportive care  Other Active Problems Hypothyroidism, continue home synthroid Constipation  Hospital day # 9   This patient is critically ill due to brainstem stroke, dysphagia, dysarthria, respiratory failure, cardiac rest and at significant risk of neurological worsening, death form recurrent stroke, heart failure, respiratory failure. This patient's care requires constant monitoring of vital signs, hemodynamics, respiratory and cardiac monitoring, review of multiple databases, neurological assessment, discussion with family, other specialists and medical decision making of high complexity. I spent 40  minutes of neurocritical care time in the care of this patient.    To contact Stroke Continuity provider, please refer to [email or web address] After hours, contact General Neurology

## 2022-04-01 NOTE — Progress Notes (Signed)
Post extubation breathing comfortably, mild post-extubation stridor. No accessory muscle use. 100% on [place].  Racemic epi, solumedrol  [name], DO 04/01/22 10:46 AM  [facility]

## 2022-04-02 ENCOUNTER — Inpatient Hospital Stay (HOSPITAL_COMMUNITY): Payer: Medicare Other

## 2022-04-02 DIAGNOSIS — I63212 Cerebral infarction due to unspecified occlusion or stenosis of left vertebral arteries: Secondary | ICD-10-CM | POA: Diagnosis not present

## 2022-04-02 DIAGNOSIS — J9601 Acute respiratory failure with hypoxia: Secondary | ICD-10-CM | POA: Diagnosis not present

## 2022-04-02 DIAGNOSIS — Z9911 Dependence on respirator [ventilator] status: Secondary | ICD-10-CM | POA: Diagnosis not present

## 2022-04-02 LAB — CBC
HCT: 27 % — ABNORMAL LOW (ref 36.0–46.0)
Hemoglobin: 8.7 g/dL — ABNORMAL LOW (ref 12.0–15.0)
MCH: 29.1 pg (ref 26.0–34.0)
MCHC: 32.2 g/dL (ref 30.0–36.0)
MCV: 90.3 fL (ref 80.0–100.0)
Platelets: 279 10*3/uL (ref 150–400)
RBC: 2.99 MIL/uL — ABNORMAL LOW (ref 3.87–5.11)
RDW: 14.1 % (ref 11.5–15.5)
WBC: 11.8 10*3/uL — ABNORMAL HIGH (ref 4.0–10.5)
nRBC: 0 % (ref 0.0–0.2)

## 2022-04-02 LAB — GLUCOSE, CAPILLARY
Glucose-Capillary: 208 mg/dL — ABNORMAL HIGH (ref 70–99)
Glucose-Capillary: 218 mg/dL — ABNORMAL HIGH (ref 70–99)
Glucose-Capillary: 140 mg/dL — ABNORMAL HIGH (ref 70–99)
Glucose-Capillary: 252 mg/dL — ABNORMAL HIGH (ref 70–99)
Glucose-Capillary: 183 mg/dL — ABNORMAL HIGH (ref 70–99)
Glucose-Capillary: 74 mg/dL (ref 70–99)

## 2022-04-02 MED ORDER — DEXTROSE 50 % IV SOLN
25.0000 g | Freq: Once | INTRAVENOUS | Status: AC
  Administered 2022-04-02: 25 g via INTRAVENOUS
  Filled 2022-04-02: qty 50

## 2022-04-02 MED ORDER — LACTULOSE 10 GM/15ML PO SOLN: 10.0000 g | Freq: Three times a day (TID) | ORAL | Status: DC

## 2022-04-02 MED ORDER — INSULIN ASPART 100 UNIT/ML IJ SOLN
12.0000 [IU] | INTRAMUSCULAR | Status: AC
  Administered 2022-04-02 (×2): 12 [IU] via SUBCUTANEOUS

## 2022-04-02 NOTE — Progress Notes (Signed)
STROKE TEAM PROGRESS NOTE   INTERVAL HISTORY Trail extubation failed yesterday and now reintubated but alert, awake, following commands.   She is scheduled for PEG tomorrow. CT abd this morning.  Discussed case with Dr. [name], [place].   Vitals:   04/02/22 0900 04/02/22 1000 04/02/22 1043 04/02/22 1100  BP:   (!) 127/114 (!) 143/60  Pulse: (!) 102 (!) 105 (!) 104 97  Resp: 16 (!) 21 16 (!) 21  Temp:      TempSrc:      SpO2: 100% 100% 100% 100%  Weight:      Height:       CBC:  Recent Labs  Lab 04/01/22 0052 04/01/22 1255 04/02/22 0235  WBC 8.9  --  11.8*  HGB 9.6* 9.5* 8.7*  HCT 30.0* 28.0* 27.0*  MCV 90.1  --  90.3  PLT 298  --  163    Basic Metabolic Panel:  Recent Labs  Lab 03/30/22 0344 03/31/22 0940 04/01/22 0052 04/01/22 1255  NA 145 144 147* 144  K 3.7 3.7 3.8 4.9  CL 113* 108 109  --   CO2 '23 24 27  '$ --   GLUCOSE 210* 154* 83  --   BUN 44* 34* 41*  --   CREATININE 1.54* 1.68* 1.89*  --   CALCIUM 8.9 9.0 9.2  --   MG 2.6* 2.4  --   --   PHOS 3.2 4.1  --   --     Lipid Panel:  No results for input(s): CHOL, TRIG, HDL, CHOLHDL, VLDL, LDLCALC in the last 168 hours. HgbA1c:  No results for input(s): HGBA1C in the last 168 hours. Urine Drug Screen: No results for input(s): LABOPIA, COCAINSCRNUR, LABBENZ, AMPHETMU, THCU, LABBARB in the last 168 hours.  Alcohol Level No results for input(s): ETH in the last 168 hours.  IMAGING past 24 hours DG CHEST PORT 1 VIEW  Result Date: 04/01/2022 CLINICAL DATA:  Acute respiratory failure. Endotracheally intubated. Nasogastric tube placement. EXAM: PORTABLE CHEST 1 VIEW COMPARISON:  Prior today FINDINGS: Endotracheal tube remains in appropriate position. Nasogastric tube is seen with tip overlying the body of the stomach. The heart size and mediastinal contours are within normal limits. Aortic atherosclerotic calcification incidentally noted. Increased atelectasis or infiltrate is seen in the left lower lobe with  silhouetting of the left hemidiaphragm. IMPRESSION: Endotracheal tube and nasogastric tube in appropriate position. Increased left lower lobe atelectasis versus infiltrate. Electronically Signed   By: Marlaine Hind M.D.   On: 04/01/2022 12:37    PHYSICAL EXAM General:  Alert, well-developed, well-nourished, intubated elderly lady in no acute distress Respiratory:  on vent.  NEURO:  Awake alert, on vent, able to follow commands and   Nods yes/no to questions.PERRL. +diplopia. left eye unable to abduct. blink to threat b/l. Motor: non focal. Able to lift all ext off bed. No drift.  Sensory: intact to LT throughout.    ASSESSMENT/PLAN [name] is a 71 y.o. 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  MRI reveals a left medullary stroke.  On 5/14, patient was intubated due to respiratory distress.    5/20: Extubated then required reintubation: plan for trach/PEG 5/21: Scheduled for PEG tomorrow. CT abd this am.   Stroke:  left medulalry infarct likely secondary large vessel disease of left V4 high grade stenosis vs. Short segmental occlusion MRI  Small acute  to early subacute infarct in left aspect of the medulla, chronic white matter microangiopathy MRA head and neck  high grade narrowing of left V4 segment, moderate narrowing of bilateral MCA Repeat MRI 5/13 stable left medullary infarct 2D Echo EF 79-89%, grade 1 diastolic dysfunction, no atrial level shunt Loop recorder interrogation no A-fib LDL 142 HgbA1c 9.5 VTE prophylaxis - lovenox On Plavix and pletal prior to admission, now on aspirin 325 mg per tube.  Will hold off on Brillinta given need for trach/PEG.  Therapy recommendations:  CIR Disposition:  pending  Respiratory failure Aspiration  Pt choked on saliva  Likely aspirated, on unasyn  CCM on board Emergent intubated  5/14 CCM  Planning for trach   Hx of stroke First one in 2004 and on plavix 01/2011 right CC stroke with left sided weakness, balance issue and slurry speech - residue of walking with hopping on the left side - on plavix and pletal 02/2018 admitted for left-sided weakness, left upper extremity muscle spasm, headache.  MRI showed right CR infarct.  MRA and carotid Doppler negative.  EF 60 to 65%.  LDL 87, A1c 8.9.  Discharged on Plavix and Pletal and Lopid.  Loop recorder was placed after discharge.  Cardio arrest x 2 5/14 patient choked on saliva -> desaturation-> bradycardia-> asystole-> CPR->ROSC in 2 to 3 minutes 5/19 left decubitus positioning -> asystole -> CPR -> ROSC within 1 minute -> followed with seizure-like activity but more consistent with convulsive syncope  Hypertension Home meds:  amlodipine 10 mg daily, losartan-HCTZ 100-25 mg daily Stable Long-term BP goal normotensive  Hyperlipidemia Home meds:  gemfibrozil 600 mg daily, resumed in hospital LDL 142, goal < 70 High intensity statin not indicated due to previous intolerance Consider lipid clinic referral  Diabetes type II Uncontrolled Home meds:  lantus insulin 26 units BID HgbA1c 9.5, goal < 7.0 Now on insulin CBGs SSI Close PCP follow up for better DM control.  Dysphagia Status post cortrak -> nosebleed -> cortrak removed Has OG tube now On TF PEG planned.  Other Stroke Risk Factors Advanced Age >/= 71  Former cigarette smoker Hx stroke  AKI on CKD3 Cre 1.38-1.46-1.99-1.72-1.68-1.89 CCM on board Avoid contrast when possible Renally dose medications as appropriate  Left chest wall pain EKG no ST-T changes Chest x-ray unremarkable Likely related to chest compression on 5/14  B/l LE numbness Bilateral LE venous Doppler negative for DVT Supportive care  Other Active Problems Hypothyroidism, continue home synthroid Constipation  Hospital day # 10   This patient is critically ill due to  brainstem stroke, dysphagia, dysarthria, respiratory failure, cardiac rest and at significant risk of neurological worsening, death form recurrent stroke, heart failure, respiratory failure. This patient's care requires constant monitoring of vital signs, hemodynamics, respiratory and cardiac monitoring, review of multiple databases, neurological assessment, discussion with family, other specialists and medical decision making of high complexity. I spent 35 minutes of neurocritical care time in the care of this patient.    To contact Stroke Continuity provider, please refer to [email or web address] After hours, contact General Neurology

## 2022-04-02 NOTE — Progress Notes (Signed)
Pt was transported to CT scan and back without complication.

## 2022-04-02 NOTE — Progress Notes (Signed)
NAME:  [name], MRN:  [id number], DOB:  [birth year], LOS: 100 ADMISSION DATE:  03/23/2022, CONSULTATION DATE: 03/25/2022 REFERRING MD:  Jonetta Osgood, MD , CHIEF COMPLAINT: Increasing shortness of breath  History of Present Illness:  71 year old female with poorly controlled diabetes, hypertension, CKD stage IIIb and prior history of CVA who presented with vertigo and progressive dysphagia, she was noted to have left medullary acute stroke, was admitted to hospital service for further management, stroke team is following.  This afternoon patient started with increasing dysphagia and unable to protect her airway, she required frequent suctioning, she became hypoxic and her oxygen requirement went up, now she is on Ventimask, PCCM was consulted for help evaluation and management  Pertinent  Medical History   Past Medical History:  Diagnosis Date   Anemia    Arteriosclerotic cardiovascular disease    CKD (chronic kidney disease) stage 3, GFR 30-59 ml/min (HCC)    Claudication in peripheral vascular disease ([place])    CVA (cerebral vascular accident) ([place])    2012 and 2019-affected left side + dysarthria 2/2 severe R ACA and MCA stenosis   Epistaxis 03/29/2022   GERD (gastroesophageal reflux disease)    Hyperlipidemia    declines statins   Hypertension    Hypothyroidism    Type 2 diabetes mellitus ([place]) 2000   Vitamin D deficiency      Significant Hospital Events: Including procedures, antibiotic start and stop dates in addition to other pertinent events   5/11 admitted 5/14 intubated for cardiac arrest, likely 2/2 aspiration vs hypoxia, Unasyn started 5/17 awake. Following commands. Sig difficulty w/ glycemic control. Adjustments made to basal and ss dosing 5/18 awake. VTs low on PSV. Failed initial am SBT (sats 60s; reporting SOB). Got SMPG still no BM 5/19 pulmonary edema on cxr getting lasix. SSE for constipation  5/20 extubated, reintubated.  Interim History /  Subjective:  Reintubated yesterday for stridor post-extubation. This morning she denies complaints.   Objective   Blood pressure 134/74, pulse 94, temperature 99.8 F (37.7 C), temperature source Axillary, resp. rate 20, height '5\' 2"'$  (1.575 m), weight 74.8 kg, SpO2 95 %.    Vent Mode: PRVC FiO2 (%):  [40 %] 40 % Set Rate:  [20 bmp] 20 bmp Vt Set:  [400 mL] 400 mL PEEP:  [5 cmH20] 5 cmH20 Plateau Pressure:  [14 cmH20-16 cmH20] 16 cmH20   Intake/Output Summary (Last 24 hours) at 04/02/2022 1253 Last data filed at 04/02/2022 1200 Gross per 24 hour  Intake 5433.47 ml  Output 1100 ml  Net 4333.47 ml    Filed Weights   03/25/22 0141 03/27/22 0500 03/30/22 0500  Weight: 74.9 kg 74.2 kg 74.8 kg    Examination: General elderly woman lying in bed in NAD HENTNC/AT, eyes anicteic Pulm: synchronous with MV, CTAB. Card S1S2, RRR Abd soft, NT Ext no c/c/e Neuro RASS 0, can lift arms and legs off the bed, trying to communicate nonverbally Derm: warm, dry, no rashes  BG 140-200s WBC 11.8 H/H 8.7/27  CT abd pelvis: consolidations in bilateral LL with air bronchograms  Resolved Hospital Problem list   Epistaxis  Assessment & Plan:  Principal Problem:   CVA (cerebral vascular accident) ([place]) Active Problems:   Hypothyroidism   DM2 (diabetes mellitus, type 2) ([place])   HLD (hyperlipidemia)   HTN (hypertension)   Stage 3b chronic kidney disease (CKD) (HCC)   GERD (gastroesophageal reflux disease)   Normocytic anemia   Dysphagia due to recent stroke  CAD (coronary artery disease)   Acute respiratory failure (HCC)   Aspiration pneumonia (HCC)   Pulmonary edema   Acute respiratory failure with hypoxia requiring MV Aspiration pneumonia RLL, persistent infiltrates on imaging but clinically improved Dysphagia from CVA Post-extubation stridor -LTVV -daily SAT & SBT -glycopyrrolate for oral secretions -VAP prevention protocol -PAD protocol- cannot tolerated precedex; prev brady  arrest -still think proceeding with trach is most reasonable despite emergent need for intubation being stridor.   Have previously discussed trach Monday with her daughter. -NPO p midnight.   Medullary CVA with dysphagia and dysarthria --PT, OT, will also need SLP - ASA, statin, can resume plavix after trach and PEG  Constipation -Senna, MiraLAX, lactulose TID   Hyperglycemia, resolved -glargine decreased to 15 units daily while tube feeds are held> con't today since NPO past midnight -increase TF coverage today SSI PRN -goal BG 140-180   AKI on CKD 3b -check BMP tomorrow morning -strict I/Os -renally dose meds, avoid nephrotoxic meds  Hypernatremia -FWF -Monitor  HLD -con't crestor--seems to be tolerating (previously had listed intolerance in the chart) -con't gemfibrozil   Anemia -transfuse for Hb <7  At risk of malnutrition -TF -hopefully IR PEG tomorrow; NPO p midnight -not a good candidate for cortrak due to previous severe epistaxis   Hypothyroidism -con't synthroid    Best Practice (right click and "Reselect all SmartList Selections" daily)   Diet/type: tubefeeds DVT prophylaxis: LMWH GI prophylaxis: H2B Lines: N/A Foley:  N/A Code Status:  full code Last date of multidisciplinary goals of care discussion [5/20, daughter updated)   This patient is critically ill with multiple organ system failure which requires frequent high complexity decision making, assessment, support, evaluation, and titration of therapies. This was completed through the application of advanced monitoring technologies and extensive interpretation of multiple databases. During this encounter critical care time was devoted to patient care services described in this note for 35 minutes.  [name], DO 04/02/22 1:25 PM [facility]

## 2022-04-02 NOTE — Progress Notes (Signed)
Please call daughter, [name], when it is known what time Trach and PEG are scheduled on Monday.

## 2022-04-03 ENCOUNTER — Inpatient Hospital Stay (HOSPITAL_COMMUNITY): Payer: Medicare Other

## 2022-04-03 DIAGNOSIS — J9601 Acute respiratory failure with hypoxia: Secondary | ICD-10-CM | POA: Diagnosis not present

## 2022-04-03 DIAGNOSIS — J690 Pneumonitis due to inhalation of food and vomit: Secondary | ICD-10-CM | POA: Diagnosis not present

## 2022-04-03 DIAGNOSIS — I63212 Cerebral infarction due to unspecified occlusion or stenosis of left vertebral arteries: Secondary | ICD-10-CM | POA: Diagnosis not present

## 2022-04-03 DIAGNOSIS — I69391 Dysphagia following cerebral infarction: Secondary | ICD-10-CM | POA: Diagnosis not present

## 2022-04-03 LAB — BASIC METABOLIC PANEL
Anion gap: 8 (ref 5–15)
BUN: 36 mg/dL — ABNORMAL HIGH (ref 8–23)
CO2: 26 mmol/L (ref 22–32)
Calcium: 9.1 mg/dL (ref 8.9–10.3)
Chloride: 105 mmol/L (ref 98–111)
Creatinine, Ser: 1.66 mg/dL — ABNORMAL HIGH (ref 0.44–1.00)
GFR, Estimated: 33 mL/min — ABNORMAL LOW (ref >60)
Glucose, Bld: 117 mg/dL — ABNORMAL HIGH (ref 70–99)
Potassium: 4 mmol/L (ref 3.5–5.1)
Sodium: 139 mmol/L (ref 135–145)

## 2022-04-03 LAB — GLUCOSE, CAPILLARY
Glucose-Capillary: 114 mg/dL — ABNORMAL HIGH (ref 70–99)
Glucose-Capillary: 115 mg/dL — ABNORMAL HIGH (ref 70–99)
Glucose-Capillary: 126 mg/dL — ABNORMAL HIGH (ref 70–99)
Glucose-Capillary: 132 mg/dL — ABNORMAL HIGH (ref 70–99)
Glucose-Capillary: 157 mg/dL — ABNORMAL HIGH (ref 70–99)
Glucose-Capillary: 182 mg/dL — ABNORMAL HIGH (ref 70–99)
Glucose-Capillary: 190 mg/dL — ABNORMAL HIGH (ref 70–99)
Glucose-Capillary: 64 mg/dL — ABNORMAL LOW (ref 70–99)

## 2022-04-03 MED ORDER — LACTATED RINGERS IV BOLUS
1000.0000 mL | Freq: Once | INTRAVENOUS | Status: AC
  Administered 2022-04-03: 1000 mL via INTRAVENOUS

## 2022-04-03 MED ORDER — ROCURONIUM BROMIDE 10 MG/ML (PF) SYRINGE
PREFILLED_SYRINGE | INTRAVENOUS | Status: AC
  Administered 2022-04-03: 100 mg
  Filled 2022-04-03: qty 10

## 2022-04-03 MED ORDER — ETOMIDATE 2 MG/ML IV SOLN
20.0000 mg | Freq: Once | INTRAVENOUS | Status: AC
  Administered 2022-04-03: 20 mg via INTRAVENOUS
  Filled 2022-04-03: qty 10

## 2022-04-03 MED ORDER — MIDAZOLAM HCL 2 MG/2ML IJ SOLN
5.0000 mg | Freq: Once | INTRAMUSCULAR | Status: AC
  Administered 2022-04-03: 4 mg via INTRAVENOUS
  Filled 2022-04-03: qty 6

## 2022-04-03 MED ORDER — SODIUM CHLORIDE 0.9 % IV SOLN
250.0000 mL | INTRAVENOUS | Status: DC
Start: 2022-04-03 — End: 2022-04-04

## 2022-04-03 MED ORDER — FENTANYL CITRATE PF 50 MCG/ML IJ SOSY
200.0000 ug | PREFILLED_SYRINGE | Freq: Once | INTRAMUSCULAR | Status: AC
  Administered 2022-04-03: 150 ug via INTRAVENOUS
  Filled 2022-04-03: qty 4

## 2022-04-03 MED ORDER — INSULIN ASPART 100 UNIT/ML IJ SOLN
1.0000 [IU] | INTRAMUSCULAR | Status: DC
  Administered 2022-04-03: 1 [IU] via SUBCUTANEOUS

## 2022-04-03 MED ORDER — ASPIRIN 81 MG PO CHEW
81.0000 mg | CHEWABLE_TABLET | Freq: Every day | ORAL | Status: DC
  Administered 2022-04-05: 81 mg
  Filled 2022-04-03: qty 1

## 2022-04-03 MED ORDER — ROCURONIUM BROMIDE 10 MG/ML (PF) SYRINGE: PREFILLED_SYRINGE | Freq: Once | INTRAVENOUS | Status: AC

## 2022-04-03 MED ORDER — DEXTROSE IN LACTATED RINGERS 5 % IV SOLN: INTRAVENOUS | Status: AC

## 2022-04-03 MED ORDER — ROCURONIUM BROMIDE 50 MG/5ML IV SOLN
100.0000 mg | Freq: Once | INTRAVENOUS | Status: DC
  Filled 2022-04-03: qty 10

## 2022-04-03 MED ORDER — TICAGRELOR 90 MG PO TABS: 90.0000 mg | ORAL_TABLET | Freq: Two times a day (BID) | ORAL | Status: DC

## 2022-04-03 MED ORDER — ASPIRIN 81 MG PO CHEW: 81.0000 mg | CHEWABLE_TABLET | Freq: Every day | ORAL | Status: DC

## 2022-04-03 MED ORDER — NOREPINEPHRINE 4 MG/250ML-% IV SOLN
2.0000 ug/min | INTRAVENOUS | Status: DC
  Administered 2022-04-03: 5 ug/min via INTRAVENOUS

## 2022-04-03 MED ORDER — TICAGRELOR 90 MG PO TABS
90.0000 mg | ORAL_TABLET | Freq: Two times a day (BID) | ORAL | Status: DC
  Administered 2022-04-05: 90 mg
  Filled 2022-04-03: qty 1

## 2022-04-03 MED ORDER — NOREPINEPHRINE 4 MG/250ML-% IV SOLN
INTRAVENOUS | Status: AC
  Filled 2022-04-03: qty 250

## 2022-04-03 NOTE — Progress Notes (Signed)
Nutrition Follow-up  DOCUMENTATION CODES:   Not applicable  INTERVENTION:   Resume TF via PEG Jevity 1.5 @ 50 mL/hr (1200 mL/day) via PEG 45 mL ProSource TF - BID  Provides 1880 kcal, 99 gm of protein, 912 mL free water    200 ml free water every 2 hours Total free water: 3312 ml   NUTRITION DIAGNOSIS:   Inadequate oral intake related to inability to eat as evidenced by NPO status. Ongoing.   GOAL:   Patient will meet greater than or equal to 90% of their needs Met with TF at goal   MONITOR:   Diet advancement, Labs, Weight trends, TF tolerance  REASON FOR ASSESSMENT:   Consult Enteral/tube feeding initiation and management  ASSESSMENT:   71 y.o. female presented to the ED with weakness and lightheadedness. PMH includes CVA, T2DM, HTN, CKD IIIb, and GERD. Pt admitted with CVA.  Pt discussed during ICU rounds and with RN and MD. Pt failed extubation over the weekend. Trach and PEG today.   5/11 admitted 5/12 s/p cortrak placement later removed due to nose bleed 5/13 tx to ICU 5/14 developed respiratory distress due to aspiration and required emergent intubation.   5/22 - s/p trach and PEG  Medications reviewed and include: colace, pepcid, SSI, novolog, semglee, synthroid, miralax, KCl, senokot   Labs reviewed:  A1C: 9.5 (5/12) CBG's: 126-190  Diet Order:   Diet Order             Diet NPO time specified  Diet effective now                   EDUCATION NEEDS:   No education needs have been identified at this time  Skin:  Skin Assessment: Reviewed RN Assessment  Last BM:  5/22 via rectal tube; after given bowel regimen meds and enema  Height:   Ht Readings from Last 1 Encounters:  03/23/22 5' 2"  (1.575 m)    Weight:   Wt Readings from Last 1 Encounters:  03/30/22 74.8 kg    Ideal Body Weight:  50 kg  BMI:  Body mass index is 30.16 kg/m.  Estimated Nutritional Needs:   Kcal:  1800-2000  Protein:  90-105 grams  Fluid:  >/= 1.8  L  [name] P., RD, LDN, CNSC See AMiON for contact information

## 2022-04-03 NOTE — Progress Notes (Signed)
Occupational Therapy Treatment Patient Details Name: [name] MRN: [id number] DOB: April 30, 1951 Today's Date: 04/03/2022   History of present illness Pt is a 71 year old woman admitted on 03/23/22 with dizziness, decreased balance and hoarse voice. + stroke L medulla. Patient coded and required CPR and intubation on 5/14. PMH: CVA, DM@, CKDIII, PVD, HTN, hypothyroidism.   OT comments  Pt is progressing well with improvements noted in command following, communication, strength, coordination, activity tolerance, self awareness and balance. Pt was able to get to the EOB with mod A for cues and trunk elevation. Once sitting pt had L lateral lean and pushing with R, however she self corrected to midline with cues only and was able to sustain while completing brief grooming task given min G. With fatigue and decreased attention to sitting, pt did required up to mod A for sitting balance at times. Pt also stood at the bed side 2x with min A +2 for balance, cues and safety. Overall pt tolerated 10 minutes of EOB/OOB functional activity this session. She followed all simple commands and communicated effectively and appropriately throughout with use of gestures. Pt continues to benefit and d/c recommendation is appropriate.     Recommendations for follow up therapy are one component of a multi-disciplinary discharge planning process, led by the attending physician.  Recommendations may be updated based on patient status, additional functional criteria and insurance authorization.    Follow Up Recommendations  Acute inpatient rehab (3hours/day)    Assistance Recommended at Discharge Frequent or constant Supervision/Assistance  Patient can return home with the following  Help with stairs or ramp for entrance;A lot of help with walking and/or transfers;A lot of help with bathing/dressing/bathroom;Assistance with cooking/housework;Assistance with feeding;Direct supervision/assist for medications  management;Direct supervision/assist for financial management;Assist for transportation   Equipment Recommendations  Other (comment)    Recommendations for Other Services Rehab consult    Precautions / Restrictions Precautions Precautions: Fall Restrictions Weight Bearing Restrictions: No       Mobility Bed Mobility Overal bed mobility: Needs Assistance Bed Mobility: Supine to Sit, Sit to Supine     Supine to sit: Mod assist, +2 for safety/equipment Sit to supine: Mod assist, +2 for safety/equipment   General bed mobility comments: cues for sequencing and assist for trunk, initially with severe L leaning, able to correct and maintain with min G    Transfers Overall transfer level: Needs assistance Equipment used: Rolling walker (2 wheels) Transfers: Sit to/from Stand Sit to Stand: Min assist, +2 physical assistance, +2 safety/equipment           General transfer comment: stood at bed side 2x, first with 2 person hand hold and the second with RW. min A for L blockin to prevent leaning     Balance Overall balance assessment: Needs assistance Sitting-balance support: Feet supported Sitting balance-Leahy Scale: Poor Sitting balance - Comments: L leaning and pushing with R - however, much improved and able to sustain midline with cues Postural control: Left lateral lean Standing balance support: Bilateral upper extremity supported, During functional activity Standing balance-Leahy Scale: Poor                             ADL either performed or assessed with clinical judgement   ADL Overall ADL's : Needs assistance/impaired     Grooming: Set up;Sitting;Min guard Grooming Details (indicate cue type and reason): min G for sitting balance after cues to decrease pushing. Pt very aware  of drool on face and where tubes are                 Toilet Transfer: Moderate assistance;+2 for physical assistance;+2 for safety/equipment;BSC/3in1 Toilet Transfer  Details (indicate cue type and reason): simulated at the bed side         Functional mobility during ADLs: Moderate assistance;+2 for physical assistance;+2 for safety/equipment;Rolling walker (2 wheels) General ADL Comments: great progress noted this session. following most commands and eager to participate. good self awareness of midline posture able to self correct and maintain with cues to better participate in grooming unsupported    Extremity/Trunk Assessment Upper Extremity Assessment Upper Extremity Assessment: RUE deficits/detail;LUE deficits/detail RUE Deficits / Details: still weak however full ROM and using functionally for gromming and mobility RUE Coordination: decreased gross motor LUE Deficits / Details: weaker than R, impaired coordination LUE Coordination: decreased fine motor;decreased gross motor   Lower Extremity Assessment Lower Extremity Assessment: Defer to PT evaluation        Vision   Vision Assessment?: Vision impaired- to be further tested in functional context   Perception Perception Perception: Not tested   Praxis Praxis Praxis: Not tested    Cognition Arousal/Alertness: Awake/alert Behavior During Therapy: Anxious Overall Cognitive Status: Impaired/Different from baseline Area of Impairment: Attention, Following commands, Safety/judgement, Awareness, Problem solving                   Current Attention Level: Selective   Following Commands: Follows one step commands consistently   Awareness: Emergent Problem Solving: Slow processing General Comments: pt's cognition is much improved this date, she is communicating appropriately and effectively with miming and pointing. All behaviors are approrpiate. Pt eager to participate. following all commands with increased time for processing        Exercises      Shoulder Instructions       General Comments on vent communicated with gestures, VSS    Pertinent Vitals/ Pain       Pain  Assessment Pain Assessment: Faces Faces Pain Scale: Hurts a little bit Pain Location: generalized with mobility Pain Descriptors / Indicators: Grimacing, Guarding Pain Intervention(s): Limited activity within patient's tolerance, Monitored during session  Home Living                                          Prior Functioning/Environment              Frequency  Min 3X/week        Progress Toward Goals  OT Goals(current goals can now be found in the care plan section)  Progress towards OT goals: Progressing toward goals  Acute Rehab OT Goals OT Goal Formulation: With patient Time For Goal Achievement: 04/07/22 Potential to Achieve Goals: Good ADL Goals Pt Will Perform Grooming: with supervision;standing Pt Will Perform Lower Body Bathing: with supervision;sit to/from stand Pt Will Perform Lower Body Dressing: with supervision;sit to/from stand Pt Will Transfer to Toilet: with supervision;ambulating;regular height toilet Pt Will Perform Toileting - Clothing Manipulation and hygiene: with supervision;sit to/from stand Additional ADL Goal #1: Pt will complete pill box test.  Plan Discharge plan remains appropriate    Co-evaluation    PT/OT/SLP Co-Evaluation/Treatment: Yes Reason for Co-Treatment: Complexity of the patient's impairments (multi-system involvement);To address functional/ADL transfers   OT goals addressed during session: ADL's and self-care      AM-PAC OT "6 Clicks" Daily  Activity     Outcome Measure   Help from another person eating meals?: Total Help from another person taking care of personal grooming?: A Lot Help from another person toileting, which includes using toliet, bedpan, or urinal?: Total Help from another person bathing (including washing, rinsing, drying)?: A Lot Help from another person to put on and taking off regular upper body clothing?: A Lot Help from another person to put on and taking off regular lower body  clothing?: A Lot 6 Click Score: 10    End of Session Equipment Utilized During Treatment: Gait belt;Rolling walker (2 wheels)  OT Visit Diagnosis: Unsteadiness on feet (R26.81);Dizziness and giddiness (R42)   Activity Tolerance Patient tolerated treatment well   Patient Left in bed;with call bell/phone within reach;with bed alarm set   Nurse Communication Mobility status        Time: 0768-0881 OT Time Calculation (min): 24 min  Charges: OT General Charges $OT Visit: 1 Visit OT Treatments $Therapeutic Activity: 8-22 mins   Jalaysha Skilton A Kenedie Dirocco 04/03/2022, 1:02 PM

## 2022-04-03 NOTE — Progress Notes (Addendum)
[name] is a week continue to follow closely which is short-lived STROKE TEAM PROGRESS NOTE   INTERVAL HISTORY Patient is seen in her room with no family at bedside.  She has been hemodynamically stable and her neurological exam is unchanged.  She is scheduled for tracheostomy and possible PEG tube placement today.  She is breathing comfortably on the ventilator and follows all commands.   Vitals:   04/03/22 0942 04/03/22 1000 04/03/22 1014 04/03/22 1040  BP:  (!) 167/71    Pulse: 84  88 84  Resp: 15  14 (!) 21  Temp:      TempSrc:      SpO2: 100%  100% 100%  Weight:      Height:       CBC:  Recent Labs  Lab 04/01/22 0052 04/01/22 1255 04/02/22 0235  WBC 8.9  --  11.8*  HGB 9.6* 9.5* 8.7*  HCT 30.0* 28.0* 27.0*  MCV 90.1  --  90.3  PLT 298  --  970    Basic Metabolic Panel:  Recent Labs  Lab 03/30/22 0344 03/31/22 0940 04/01/22 0052 04/01/22 1255 04/03/22 0232  NA 145 144 147* 144 139  K 3.7 3.7 3.8 4.9 4.0  CL 113* 108 109  --  105  CO2 '23 24 27  '$ --  26  GLUCOSE 210* 154* 83  --  117*  BUN 44* 34* 41*  --  36*  CREATININE 1.54* 1.68* 1.89*  --  1.66*  CALCIUM 8.9 9.0 9.2  --  9.1  MG 2.6* 2.4  --   --   --   PHOS 3.2 4.1  --   --   --     Lipid Panel:  No results for input(s): CHOL, TRIG, HDL, CHOLHDL, VLDL, LDLCALC in the last 168 hours. HgbA1c:  No results for input(s): HGBA1C in the last 168 hours. Urine Drug Screen: No results for input(s): LABOPIA, COCAINSCRNUR, LABBENZ, AMPHETMU, THCU, LABBARB in the last 168 hours.  Alcohol Level No results for input(s): ETH in the last 168 hours.  IMAGING past 24 hours No results found.  PHYSICAL EXAM General:  Alert, well-developed, well-nourished, elderly African-American lady intubated  in no acute distress Respiratory:  on vent.  NEURO:  Awake alert, on vent, able to follow commands and nods yes/no to questions.PERRL. EOMI with right sided nystagmus. blink to threat b/l. Motor: non focal. Able to lift  all ext off bed. No drift. Dysmetria present in LUE Sensory: intact to LT throughout.    ASSESSMENT/PLAN [name] is a 71 y.o. 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  MRI reveals a left medullary stroke.  On 5/14, patient was intubated due to respiratory distress.    5/20: Extubated then required reintubation: plan for trach/PEG 5/21: Scheduled for PEG tomorrow. CT abd this am.   Stroke:  left medulalry infarct likely secondary large vessel disease of left V4 high grade stenosis vs. Short segmental occlusion MRI  Small acute to early subacute infarct in left aspect of the medulla, chronic white matter microangiopathy MRA head and neck  high grade narrowing of left V4 segment, moderate narrowing of bilateral MCA Repeat MRI 5/13 stable left medullary infarct 2D Echo EF 26-37%, grade 1 diastolic dysfunction, no atrial level shunt Loop recorder interrogation no A-fib LDL 142 HgbA1c 9.5 VTE prophylaxis - lovenox On Plavix and pletal prior to  admission, now on aspirin 325 mg per tube.  Will hold off on Brillinta given need for trach/PEG.  Therapy recommendations:  CIR Disposition:  pending  Respiratory failure Aspiration  Pt choked on saliva  Likely aspirated, on unasyn  CCM on board Emergent intubated 5/14 CCM  Planning for trach   Hx of stroke First one in 2004 and on plavix 01/2011 right CC stroke with left sided weakness, balance issue and slurry speech - residue of walking with hopping on the left side - on plavix and pletal 02/2018 admitted for left-sided weakness, left upper extremity muscle spasm, headache.  MRI showed right CR infarct.  MRA and carotid Doppler negative.  EF 60 to 65%.  LDL 87, A1c 8.9.  Discharged on Plavix and Pletal and Lopid.  Loop recorder was placed after discharge.  Cardiac arrest x 2 5/14 patient  choked on saliva -> desaturation-> bradycardia-> asystole-> CPR->ROSC in 2 to 3 minutes 5/19 left decubitus positioning -> asystole -> CPR -> ROSC within 1 minute -> followed with seizure-like activity but more consistent with convulsive syncope  Hypertension Home meds:  amlodipine 10 mg daily, losartan-HCTZ 100-25 mg daily Stable Long-term BP goal normotensive  Hyperlipidemia Home meds:  gemfibrozil 600 mg daily, resumed in hospital LDL 142, goal < 70 High intensity statin not indicated due to previous intolerance Consider lipid clinic referral  Diabetes type II Uncontrolled Home meds:  lantus insulin 26 units BID HgbA1c 9.5, goal < 7.0 Now on insulin CBGs SSI Close PCP follow up for better DM control.  Dysphagia Status post cortrak -> nosebleed -> cortrak removed Has OG tube now On TF PEG planned.  Other Stroke Risk Factors Advanced Age >/= 6  Former cigarette smoker Hx stroke  AKI on CKD3 Cre 1.38-1.46-1.99-1.72-1.68-1.89-> 1.66 CCM on board Avoid contrast when possible Renally dose medications as clinically ventilatory support.  He is short-lived  Left chest wall pain EKG no ST-T changes Chest x-ray unremarkable Likely related to chest compression on 5/14  B/l LE numbness Bilateral LE venous Doppler negative for DVT Supportive care  Other Active Problems Hypothyroidism, continue home synthroid Constipation  Hospital day # [place] , MSN, AGACNP-BC [facility] See Amion for schedule and pager information 04/03/2022 11:38 AM   I have personally obtained history,examined this patient, reviewed notes, independently viewed imaging studies, participated in medical decision making and plan of care.ROS completed by me personally and pertinent positives fully documented  I have made any additions or clarifications directly to the above note. Agree with note above.  Patient with medullary infarct with difficulty protecting airway  requiring ventilatory support and elective tracheostomy and feeding tube.  She will need dual antiplatelet therapy of aspirin and Brilinta after PEG tube.  May consider possible participation in the [place] stroke prevention study if interested.  Discussed with Dr. Lynetta Mare critical care medicine.  This patient is critically ill and at significant risk of neurological worsening, death and care requires constant monitoring of vital signs, hemodynamics,respiratory and cardiac monitoring, extensive review of multiple databases, frequent neurological assessment, discussion with family, other specialists and medical decision making of high complexity.I have made any additions or clarifications directly to the above note.This critical care time does not reflect procedure time, or teaching time or supervisory time of PA/NP/Med Resident etc but could involve care discussion time.  I spent 30 minutes of neurocritical care time  in the care of  this patient.      Antony Contras, MD Medical  Director [facility] Pager: [phone] 04/03/2022 1:59 PM  To contact Stroke Continuity provider, please refer to [email or web address] After hours, contact General Neurology

## 2022-04-03 NOTE — Progress Notes (Signed)
An USGPIV (ultrasound guided PIV) has been placed to LFA 20g 1.88"for short-term vasopressor infusion. A correctly placed ivWatch must be used when administering Vasopressors. Should this treatment be needed beyond 72 hours, central line access should be obtained.  It will be the responsibility of the bedside nurse to follow best practice to prevent extravasations.

## 2022-04-03 NOTE — Progress Notes (Signed)
RT note- Placed back to full support, low VE.

## 2022-04-03 NOTE — Consult Note (Signed)
Chief Complaint: Patient was seen in consultation today for percutaneous gastric tube placement Chief Complaint  Patient presents with   Near Syncope   Nausea   at the request of Dr Genia Harold   Supervising Physician: Mir, Sharen Heck  Patient Status: [facility] - In-pt  History of Present Illness: Richele Strand is a 71 y.o. female   Hx DM; HTN; CKD Previous CVA New CVA complicated with Dysphagia Aspiration Intubated now For Trach placement today per notes and Dtr For long term care  Request for percutaneous G tube In IR  Imaging has been reviewed and approved for procedure per Dr Dwaine Gale  Past Medical History:  Diagnosis Date   Anemia    Arteriosclerotic cardiovascular disease    CKD (chronic kidney disease) stage 3, GFR 30-59 ml/min (HCC)    Claudication in peripheral vascular disease ([place])    CVA (cerebral vascular accident) ([place])    2012 and 2019-affected left side + dysarthria 2/2 severe R ACA and MCA stenosis   Epistaxis 03/29/2022   GERD (gastroesophageal reflux disease)    Hyperlipidemia    declines statins   Hypertension    Hypothyroidism    Type 2 diabetes mellitus ([place]) 2000   Vitamin D deficiency     Past Surgical History:  Procedure Laterality Date   CESAREAN SECTION     COLONOSCOPY  09/21/2020   LEFT HEART CATHETERIZATION WITH CORONARY ANGIOGRAM N/A 03/08/2012   Procedure: LEFT HEART CATHETERIZATION WITH CORONARY ANGIOGRAM;  Surgeon: Sinclair Grooms, MD;  Location: [facility] CATH LAB;  Service: Cardiovascular;  Laterality: N/A;   LEFT HEART CATHETERIZATION WITH CORONARY ANGIOGRAM N/A 12/17/2012   Procedure: LEFT HEART CATHETERIZATION WITH CORONARY ANGIOGRAM;  Surgeon: Laverda Page, MD;  Location: [facility] CATH LAB;  Service: Cardiovascular;  Laterality: N/A;   LOOP RECORDER INSERTION N/A 03/05/2018   Procedure: LOOP RECORDER INSERTION;  Surgeon: Evans Lance, MD;  Location: [facility] CV LAB;  Service: Cardiovascular;  Laterality: N/A;   LOWER EXTREMITY  ANGIOGRAM N/A 12/17/2012   Procedure: LOWER EXTREMITY ANGIOGRAM;  Surgeon: Laverda Page, MD;  Location: [facility] CATH LAB;  Service: Cardiovascular;  Laterality: N/A;   TEE WITHOUT CARDIOVERSION N/A 02/28/2018   Procedure: TRANSESOPHAGEAL ECHOCARDIOGRAM (TEE);  Surgeon: Jerline Pain, MD;  Location: [facility] ENDOSCOPY;  Service: Cardiovascular;  Laterality: N/A;   TUBAL LIGATION      Allergies: Dilantin [phenytoin sodium extended], Insulins, and Statins  Medications: Prior to Admission medications   Medication Sig Start Date End Date Taking? Authorizing Provider  amLODipine (NORVASC) 10 MG tablet Take 10 mg by mouth daily. 12/31/21  Yes [provider]  cilostazol (PLETAL) 100 MG tablet TAKE 1 TABLET BY MOUTH TWICE A DAY Patient taking differently: Take 100 mg by mouth 2 (two) times daily. 01/28/19  Yes [name], MD  clopidogrel (PLAVIX) 75 MG tablet TAKE 1 TABLET BY MOUTH EVERY DAY IN THE MORNING Patient taking differently: Take 75 mg by mouth daily. 05/18/18  Yes [name], MD  gemfibrozil (LOPID) 600 MG tablet TAKE 1 TABLET BY MOUTH TWICE A DAY Patient taking differently: Take 600 mg by mouth daily. 09/02/18  Yes [name], MD  LANTUS SOLOSTAR 100 UNIT/ML Solostar Pen 26 Units 2 (two) times daily. 02/24/20  Yes [provider]  levothyroxine (SYNTHROID, LEVOTHROID) 88 MCG tablet TAKE 1 TABLET BY MOUTH EVERY DAY BEFORE BREAKFAST Patient taking differently: Take 88 mcg by mouth daily before breakfast. 09/05/18  Yes [name], MD  losartan-hydrochlorothiazide (HYZAAR) 100-25 MG tablet  Take 1 tablet by mouth daily. 06/19/19  Yes Axel Filler, MD  BD INSULIN SYRINGE ULTRAFINE 31G X 15/64" 0.5 ML MISC USE TO INJECT INSULIN TWO TIMES A DAY 11/15/16   [name], MD  glucose blood (ACCU-CHEK AVIVA PLUS) test strip 1 each by Other route 2 (two) times daily. Use as instructed 08/14/19   Axel Filler, MD  Insulin Pen Needle (B-D UF III MINI PEN  NEEDLES) 31G X 5 MM MISC INJECTS INSULIN 2 TIMES PER DAY. 05/06/19   Asencion Noble, MD  Lancets (ACCU-CHEK SOFT TOUCH) lancets Use as instructed 05/28/19   Jose Persia, MD     Family History  Problem Relation Age of Onset   Diabetes Brother    Prostate cancer Brother    Diabetes Mother    Diabetes Father    Diabetes Sister    Esophageal cancer Neg Hx    Rectal cancer Neg Hx    Stomach cancer Neg Hx    Colon cancer Neg Hx    Breast cancer Neg Hx     Social History   Socioeconomic History   Marital status: Single    Spouse name: Not on file   Number of children: 1   Years of education: 10th- GED   Highest education level: Not on file  Occupational History   Occupation: Retired  Tobacco Use   Smoking status: Former    Types: Cigarettes    Quit date: 02/04/2011    Years since quitting: 11.1   Smokeless tobacco: Never  Vaping Use   Vaping Use: Never used  Substance and Sexual Activity   Alcohol use: No    Alcohol/week: 0.0 standard drinks   Drug use: No   Sexual activity: Not on file  Other Topics Concern   Not on file  Social History Narrative   Not on file   Social Determinants of Health   Financial Resource Strain: Not on file  Food Insecurity: Not on file  Transportation Needs: Not on file  Physical Activity: Not on file  Stress: Not on file  Social Connections: Not on file    Review of Systems: A 12 point ROS discussed and pertinent positives are indicated in the HPI above.  All other systems are negative.    Vital Signs: BP (!) 191/76 Comment: prn hydralazine given  Pulse 84   Temp 98 F (36.7 C) (Axillary)   Resp 18   Ht '5\' 2"'$  (1.575 m)   Wt 164 lb 14.5 oz (74.8 kg)   SpO2 100%   BMI 30.16 kg/m   Physical Exam Vitals reviewed.  Constitutional:      Comments: Intubated  Cardiovascular:     Rate and Rhythm: Normal rate and regular rhythm.     Heart sounds: Normal heart sounds.  Pulmonary:     Comments: vent Abdominal:      Palpations: Abdomen is soft.  Skin:    General: Skin is warm.  Neurological:     Comments: Opens eyes to me No response  Psychiatric:     Comments: Spoke to Dtr [name] via phone Agrees to consent for G tube    Imaging: CT ABDOMEN WO CONTRAST  Result Date: 04/03/2022 CLINICAL DATA:  Evaluate anatomy for potential percutaneous gastrostomy tube placement. EXAM: CT ABDOMEN WITHOUT CONTRAST TECHNIQUE: Multidetector CT imaging of the abdomen was performed following the standard protocol without IV contrast. RADIATION DOSE REDUCTION: This exam was performed according to the departmental dose-optimization program which includes automated exposure control, adjustment  of the mA and/or kV according to patient size and/or use of iterative reconstruction technique. COMPARISON:  None Available. FINDINGS: The lack of intravenous contrast limits the ability to evaluate solid abdominal organs. Lower chest: Limited visualization of the lower thorax demonstrates bibasilar consolidative opacities and air bronchograms, left slightly greater than right. Borderline cardiomegaly. Coronary artery calcifications. Calcification involving the aortic root. Trace amount of pericardial fluid, presumably physiologic. Hepatobiliary: Normal hepatic contour. Suspected punctate (approximately 0.3 cm) stone lying neck of the gallbladder (image 26, series 3). Suspected calcifications involving the gallbladder wall are also seen on image 26, series 3 no definitive gallbladder wall thickening or pericholecystic stranding. No ascites. Pancreas: Normal noncontrast appearance of the pancreas. Spleen: Normal noncontrast appearance of the spleen. Note is made of a punctate splenule. Adrenals/Urinary Tract: The left kidney is slightly atrophic in relation to the right, similar to the 2016 examination. Calcifications about the bilateral renal hila are favored to be vascular in etiology. No definite evidence of nephrolithiasis. No renal stones are  seen along expected course of either ureter. No urinary obstruction or perinephric stranding. Normal noncontrast appearance the bilateral adrenal glands. The urinary bladder was not imaged. Stomach/Bowel: The anterior wall the stomach is well apposed against the ventral abdominal wall without interposition of either the liver or transverse colon. Enteric contrast is seen throughout the colon. No evidence of enteric obstruction. Normal noncontrast appearance of the terminal ileum and retrocecal appendix. No hiatal hernia. Enteric tube tip terminates within the gastric antrum. Vascular/Lymphatic: Atherosclerotic plaque within normal caliber abdominal aorta. Potential hemodynamically significant narrowings involving the bilateral common iliac arteries, left greater than right (image 58 and 59, series 3). Other: Tiny mesenteric fat containing periumbilical hernia. Mild diffuse body wall anasarca. Musculoskeletal: No acute or aggressive osseous abnormalities. Bilateral facet degenerative change the lower lumbar spine. Stigmata of dish within the thoracic spine. IMPRESSION: 1. Gastric anatomy amenable to potential percutaneous gastrostomy tube placement as indicated. 2. Bibasilar consolidative opacities with associated bronchograms, left greater right, atelectasis versus infiltrate. 3. Cholelithiasis with suspected calcifications involving the gallbladder wall without evidence of acute cholecystitis on this noncontrast examination. Further evaluation with right upper quadrant abdominal ultrasound could be performed as indicated. 4. Coronary artery calcifications. Aortic Atherosclerosis (ICD10-I70.0). 5. Potential hemodynamically significant narrowings involving the bilateral common iliac arteries, left greater than right. Correlation for symptoms of PAD is advised. Further evaluation with acquisition of nonemergent ABIs could be performed as indicated. Electronically Signed   By: Sandi Mariscal M.D.   On: 04/03/2022 09:49    DG Chest 1 View  Result Date: 03/25/2022 CLINICAL DATA:  Near syncope with shortness of breath. EXAM: CHEST  1 VIEW COMPARISON:  Mar 25, 2022 FINDINGS: The heart size and mediastinal contours are within normal limits. A radiopaque loop recorder device is noted. Low lung volumes are seen with mild bibasilar atelectasis. There is no evidence of a pleural effusion or pneumothorax. The visualized skeletal structures are unremarkable. IMPRESSION: Low lung volumes without acute cardiopulmonary disease. Electronically Signed   By: Virgina Norfolk M.D.   On: 03/25/2022 17:28   DG Chest 1 View  Result Date: 03/25/2022 CLINICAL DATA:  Near syncope.  Nausea. EXAM: CHEST  1 VIEW COMPARISON:  Chest radiographs 02/06/2017 FINDINGS: A loop recorder has been placed. The cardiomediastinal silhouette is within normal limits. The lungs are hypoinflated. No focal airspace consolidation, overt pulmonary edema, sizable pleural effusion, or pneumothorax is identified. Small density in the left mid upper abdomen may represent residual oral contrast material. IMPRESSION:  Hypoinflation without evidence of acute airspace disease. Electronically Signed   By: Logan Bores M.D.   On: 03/25/2022 11:12   MR ANGIO HEAD WO CONTRAST  Result Date: 03/25/2022 CLINICAL DATA:  Stroke follow-up EXAM: MRI HEAD WITHOUT CONTRAST MRA HEAD WITHOUT CONTRAST MRA NECK WITHOUT CONTRAST TECHNIQUE: Multiplanar, multiecho pulse sequences of the brain and surrounding structures were obtained without intravenous contrast. Angiographic images of the Circle of Willis were obtained using MRA technique without intravenous contrast. Angiographic images of the neck were obtained using MRA technique without intravenous contrast. Carotid stenosis measurements (when applicable) are obtained utilizing NASCET criteria, using the distal internal carotid diameter as the denominator. COMPARISON:  Brain MRI from 2 days ago FINDINGS: MRI HEAD FINDINGS Only diffusion  and gradient imaging was acquired per request. 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 No new area of infarction and no acute hemorrhage. No hydrocephalus or shift. MRA HEAD FINDINGS Atheromatous irregularity of the carotid siphons and bilateral MCA vessels. Multifocal moderate narrowings are seen at the bilateral M1 and M2 levels. In the posterior circulation there is high-grade narrowing of the non dominant left distal V4 segment. Negative for aneurysm or generalized beading. MRA NECK FINDINGS Normal diameter arch with 3 vessel branching. Mild atheromatous plaque at the bilateral carotid bulb. No carotid stenosis or ulceration. No proximal subclavian stenosis. The vertebral arteries are smoothly contoured and diffusely patent to the level of the dura. IMPRESSION: Brain MRI: Limited acquisition shows no progression of acute left medulla infarcts. Intracranial MRA: High-grade narrowing of the non dominant left V4 segment. Unremarkable left cerebellar branches. Moderate atheromatous narrowing of bilateral MCA. Neck MRA: Cervical carotid bifurcation atherosclerosis. No flow limiting stenosis of major arteries in the neck. Electronically Signed   By: Jorje Guild M.D.   On: 03/25/2022 11:06   MR ANGIO NECK WO CONTRAST  Result Date: 03/25/2022 CLINICAL DATA:  Stroke follow-up EXAM: MRI HEAD WITHOUT CONTRAST MRA HEAD WITHOUT CONTRAST MRA NECK WITHOUT CONTRAST TECHNIQUE: Multiplanar, multiecho pulse sequences of the brain and surrounding structures were obtained without intravenous contrast. Angiographic images of the Circle of Willis were obtained using MRA technique without intravenous contrast. Angiographic images of the neck were obtained using MRA technique without intravenous contrast. Carotid stenosis measurements (when applicable) are obtained utilizing NASCET criteria, using the distal internal carotid diameter as the denominator. COMPARISON:  Brain MRI from 2 days ago FINDINGS: MRI  HEAD FINDINGS Only diffusion and gradient imaging was acquired per request. 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 No new area of infarction and no acute hemorrhage. No hydrocephalus or shift. MRA HEAD FINDINGS Atheromatous irregularity of the carotid siphons and bilateral MCA vessels. Multifocal moderate narrowings are seen at the bilateral M1 and M2 levels. In the posterior circulation there is high-grade narrowing of the non dominant left distal V4 segment. Negative for aneurysm or generalized beading. MRA NECK FINDINGS Normal diameter arch with 3 vessel branching. Mild atheromatous plaque at the bilateral carotid bulb. No carotid stenosis or ulceration. No proximal subclavian stenosis. The vertebral arteries are smoothly contoured and diffusely patent to the level of the dura. IMPRESSION: Brain MRI: Limited acquisition shows no progression of acute left medulla infarcts. Intracranial MRA: High-grade narrowing of the non dominant left V4 segment. Unremarkable left cerebellar branches. Moderate atheromatous narrowing of bilateral MCA. Neck MRA: Cervical carotid bifurcation atherosclerosis. No flow limiting stenosis of major arteries in the neck. Electronically Signed   By: Jorje Guild M.D.   On: 03/25/2022  11:06   MR BRAIN WO CONTRAST  Result Date: 03/25/2022 CLINICAL DATA:  Stroke follow-up EXAM: MRI HEAD WITHOUT CONTRAST MRA HEAD WITHOUT CONTRAST MRA NECK WITHOUT CONTRAST TECHNIQUE: Multiplanar, multiecho pulse sequences of the brain and surrounding structures were obtained without intravenous contrast. Angiographic images of the Circle of Willis were obtained using MRA technique without intravenous contrast. Angiographic images of the neck were obtained using MRA technique without intravenous contrast. Carotid stenosis measurements (when applicable) are obtained utilizing NASCET criteria, using the distal internal carotid diameter as the denominator. COMPARISON:  Brain MRI from 2  days ago FINDINGS: MRI HEAD FINDINGS Only diffusion and gradient imaging was acquired per request. 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 No new area of infarction and no acute hemorrhage. No hydrocephalus or shift. MRA HEAD FINDINGS Atheromatous irregularity of the carotid siphons and bilateral MCA vessels. Multifocal moderate narrowings are seen at the bilateral M1 and M2 levels. In the posterior circulation there is high-grade narrowing of the non dominant left distal V4 segment. Negative for aneurysm or generalized beading. MRA NECK FINDINGS Normal diameter arch with 3 vessel branching. Mild atheromatous plaque at the bilateral carotid bulb. No carotid stenosis or ulceration. No proximal subclavian stenosis. The vertebral arteries are smoothly contoured and diffusely patent to the level of the dura. IMPRESSION: Brain MRI: Limited acquisition shows no progression of acute left medulla infarcts. Intracranial MRA: High-grade narrowing of the non dominant left V4 segment. Unremarkable left cerebellar branches. Moderate atheromatous narrowing of bilateral MCA. Neck MRA: Cervical carotid bifurcation atherosclerosis. No flow limiting stenosis of major arteries in the neck. Electronically Signed   By: Jorje Guild M.D.   On: 03/25/2022 11:06   MR BRAIN WO CONTRAST  Result Date: 03/23/2022 CLINICAL DATA:  Woke up 4 days ago feeling like she was going to pass out, persisted. Nausea. Evaluate for stroke EXAM: MRI HEAD WITHOUT CONTRAST TECHNIQUE: Multiplanar, multiecho pulse sequences of the brain and surrounding structures were obtained without intravenous contrast. COMPARISON:  MR head 02/16/2018 FINDINGS: Brain: There are small foci of patchy diffusion restriction in the left aspect of the medulla consistent with acute to early subacute infarct (5-58). There is no associated hemorrhage or mass effect. There is no other acute infarct. There is no acute intracranial hemorrhage or extra-axial  fluid collection. Background parenchymal volume is normal. The ventricles are normal in size. Patchy and confluent FLAIR signal abnormality throughout the subcortical and periventricular white matter likely reflects sequela of moderate chronic white matter microangiopathy, progressed since 2019. There are small remote lacunar infarcts in the bilateral basal ganglia. There is no solid mass lesion. There is no mass effect or midline shift. Vascular: The major intracranial flow voids are present. Specifically, the bilateral V4 segments and basilar artery flow voids are normal. Skull and upper cervical spine: Normal marrow signal. Sinuses/Orbits: The paranasal sinuses are clear. The globes and orbits are unremarkable. Other: None. IMPRESSION: 1. Small acute to early subacute infarct in the left aspect of the medulla. No associated hemorrhage or mass effect. 2. Background moderate chronic white matter microangiopathy, progressed since 2019, and small remote lacunar infarcts in the bilateral basal ganglia and right caudate head. Electronically Signed   By: Valetta Mole M.D.   On: 03/23/2022 15:39   DG CHEST PORT 1 VIEW  Result Date: 04/01/2022 CLINICAL DATA:  Acute respiratory failure. Endotracheally intubated. Nasogastric tube placement. EXAM: PORTABLE CHEST 1 VIEW COMPARISON:  Prior today FINDINGS: Endotracheal tube remains in appropriate position. Nasogastric tube is  seen with tip overlying the body of the stomach. The heart size and mediastinal contours are within normal limits. Aortic atherosclerotic calcification incidentally noted. Increased atelectasis or infiltrate is seen in the left lower lobe with silhouetting of the left hemidiaphragm. IMPRESSION: Endotracheal tube and nasogastric tube in appropriate position. Increased left lower lobe atelectasis versus infiltrate. Electronically Signed   By: Marlaine Hind M.D.   On: 04/01/2022 12:37   DG Chest Port 1 View  Result Date: 04/01/2022 CLINICAL DATA:   Pulmonary edema. EXAM: PORTABLE CHEST 1 VIEW COMPARISON:  Mar 31, 2022 FINDINGS: The ETT remains in good position, terminating above the carina. The NG tube terminates below today's film. No pneumothorax identified. No pulmonary edema. Left retrocardiac opacity not excluded based on this study. No other abnormalities. IMPRESSION: 1. Support apparatus as above. 2. No pulmonary edema. 3. Left retrocardiac opacity not excluded on this single view portable film. If there is clinical concern, recommend a PA and lateral chest x-ray. Otherwise, recommend attention on follow-up. Electronically Signed   By: Dorise Bullion III M.D.   On: 04/01/2022 08:38   DG Chest Port 1 View  Result Date: 03/31/2022 CLINICAL DATA:  Advancement of endotracheal tube. EXAM: PORTABLE CHEST 1 VIEW COMPARISON:  Earlier today at 7:25 a.m. FINDINGS: 849 hours. Endotracheal tube has been advanced and terminates 3.1 cm above carina. Nasogastric tube extends beyond the inferior aspect of the film. Loop recorder or wireless pacer projecting over the left side of the chest. Normal heart size for level of inspiration. No pleural effusion or pneumothorax. Lung volumes are low. Persistent left greater than right base airspace disease. IMPRESSION: Appropriate position of endotracheal tube. Otherwise, no significant change in low lung volumes with bibasilar airspace disease, favoring atelectasis. Electronically Signed   By: Abigail Miyamoto M.D.   On: 03/31/2022 09:15   DG Chest Port 1 View  Addendum Date: 03/31/2022   ADDENDUM REPORT: 03/31/2022 07:25 ADDENDUM: Study discussed by telephone with Dr. Oletta Darter on 03/31/2022 at 0656 hours. Electronically Signed   By: Genevie Ann M.D.   On: 03/31/2022 07:25   Result Date: 03/31/2022 CLINICAL DATA:  71 year old female with respiratory failure. EXAM: PORTABLE CHEST 1 VIEW COMPARISON:  Portable chest 03/30/2022 and earlier. FINDINGS: Portable AP semi upright view at 0531 hours. Endotracheal tube tip is now above  the level of the clavicles, approximately 7 cm from the carina. Enteric tube terminates in the stomach, side hole at the level of the gastric fundus. Mildly improved lung volumes and improved left lung base ventilation with regressed opacity and air bronchograms. Streaky residual peribronchial opacity in both lower lungs. Normal cardiac size and mediastinal contours. No pneumothorax or pulmonary edema. No definite pleural effusion. Left chest cardiac event recorder or lead less ICD. Barium type contrast at the splenic flexure. Stable visualized osseous structures. IMPRESSION: 1. ETT tip now above the clavicles. Recommend advancing 3 cm with repeat portable chest x-ray to confirm positioning. 2. Regressed left lung base collapse or consolidation since yesterday. Improving bibasilar ventilation. Electronically Signed: By: Genevie Ann M.D. On: 03/31/2022 06:43   DG Chest Port 1 View  Result Date: 03/30/2022 CLINICAL DATA:  A 71 year old female presents for evaluation of pneumonia, patient is intubated. EXAM: PORTABLE CHEST 1 VIEW COMPARISON:  Mar 29, 2022 FINDINGS: Endotracheal tube terminates between the clavicular heads approximately 4.8 cm from the carina. Tip between clavicular heads. Gastric tube with tip in the proximal to mid stomach, redundant loop in the fundus of the stomach. EKG leads project  over the patient's chest. Cardiomediastinal contours and hilar structures are stable. Cardiac loop recorder over the LEFT chest as before Persistent dense consolidation at the LEFT lung base obscuring LEFT hemidiaphragm. No visible pneumothorax. On limited assessment there is no acute skeletal finding. IMPRESSION: 1. Persistent LEFT basilar airspace disease with obscured LEFT hemidiaphragm, volume loss or pneumonia are considered, not changed from previous imaging. 2. Stable appearance of support apparatus. Electronically Signed   By: Zetta Bills M.D.   On: 03/30/2022 08:07   DG CHEST PORT 1 VIEW  Result Date:  03/29/2022 CLINICAL DATA:  [id number]; intubation EXAM: PORTABLE CHEST 1 VIEW COMPARISON:  Radiograph dated Mar 26, 2022 FINDINGS: Evaluation is limited by patient rotation. The cardiomediastinal silhouette is unchanged in contour.ETT tip terminates 5.5 cm above the carina. Enteric tube tip and side port project over the stomach. Cardiac loop recorder. Likely small LEFT pleural effusion. No pneumothorax. Persistent LEFT retrocardiac opacity and RIGHT basilar opacity, similar in comparison to prior. Enteric contrast within the colon. Atherosclerotic calcifications. IMPRESSION: 1.  Support apparatus as described above. 2. Persistent bibasilar opacities with differential considerations including infection, aspiration or atelectasis. Likely small LEFT pleural effusion. Electronically Signed   By: Valentino Saxon M.D.   On: 03/29/2022 09:51   DG CHEST PORT 1 VIEW  Addendum Date: 03/26/2022   ADDENDUM REPORT: 03/26/2022 12:52 ADDENDUM: PRA has attempted to page covering Dr. for 30 minutes. As such, these results will be called to the ordering clinician or representative by the Radiologist Assistant, and communication documented in the PACS or [employer]. Electronically Signed   By: Valentino Saxon M.D.   On: 03/26/2022 12:52   Result Date: 03/26/2022 CLINICAL DATA:  [id number]; intubation EXAM: PORTABLE CHEST 1 VIEW COMPARISON:  Chest radiograph dated Mar 25, 2022 FINDINGS: Evaluation is limited secondary to patient rotation. The cardiomediastinal silhouette is unchanged in contour.ETT tube tip projects over the RIGHT mainstem bronchus. Enteric tube tip and side port project over the stomach. Cardiac loop recorder. Small LEFT pleural effusion. No pneumothorax. LEFT retrocardiac opacity, nonspecific with differential considerations including atelectasis versus infection/aspiration. High density material delineating loops of colon consistent with prior enteric contrast administration. IMPRESSION: ETT tip  projects over the region of the RIGHT mainstem bronchus. Recommend repositioning. PRA system was activated 03/26/2022 at 12:22 p.m. at time of interpretation. Addendum will be issued upon telephonic communication. Electronically Signed: By: Valentino Saxon M.D. On: 03/26/2022 12:26   DG Abd Portable 1V  Result Date: 03/30/2022 CLINICAL DATA:  OG tube placement EXAM: PORTABLE ABDOMEN - 1 VIEW COMPARISON:  None Available. FINDINGS: Orogastric tube with the tip projecting over the stomach. Contrast in the colon. No bowel dilatation to suggest obstruction. No evidence of pneumoperitoneum, portal venous gas or pneumatosis. No pathologic calcifications along the expected course of the ureters. No acute osseous abnormality. IMPRESSION: 1. Orogastric tube with the tip projecting over the stomach. Electronically Signed   By: Kathreen Devoid M.D.   On: 03/30/2022 09:54   DG Abd Portable 1V  Result Date: 03/24/2022 CLINICAL DATA:  Nasogastric tube placement. EXAM: PORTABLE ABDOMEN - 1 VIEW COMPARISON:  June 24, 2007. FINDINGS: Feeding tube is seen entering stomach. However, due to the presence of contrast throughout the stomach, the distal tip is not visualized. IMPRESSION: Feeding tube is seen entering stomach, although distal tip is not visualized due to contrast within the stomach. Electronically Signed   By: Marijo Conception M.D.   On: 03/24/2022 16:01   DG Swallowing Func-Speech Pathology  Result Date: 03/24/2022 Table formatting from the original result was not included. Objective Swallowing Evaluation: Type of Study: MBS-Modified Barium Swallow Study  Patient Details Name: Connor Foxworthy MRN: [id number] Date of Birth: [birth year] Today's Date: 03/24/2022 Time: SLP Start Time (ACUTE ONLY): 8841 -SLP Stop Time (ACUTE ONLY): 6606 SLP Time Calculation (min) (ACUTE ONLY): 16 min Past Medical History: Past Medical History: Diagnosis Date  Anemia   Arteriosclerotic cardiovascular disease   CKD (chronic kidney  disease) stage 3, GFR 30-59 ml/min (HCC)   Claudication in peripheral vascular disease (HCC)   CVA (cerebral vascular accident) ([place])   2012 and 2019-affected left side + dysarthria 2/2 severe R ACA and MCA stenosis  GERD (gastroesophageal reflux disease)   Hyperlipidemia   declines statins  Hypertension   Hypothyroidism   Type 2 diabetes mellitus ([place]) 2000  Vitamin D deficiency  Past Surgical History: Past Surgical History: Procedure Laterality Date  CESAREAN SECTION    COLONOSCOPY  09/21/2020  LEFT HEART CATHETERIZATION WITH CORONARY ANGIOGRAM N/A 03/08/2012  Procedure: LEFT HEART CATHETERIZATION WITH CORONARY ANGIOGRAM;  Surgeon: Sinclair Grooms, MD;  Location: [facility] CATH LAB;  Service: Cardiovascular;  Laterality: N/A;  LEFT HEART CATHETERIZATION WITH CORONARY ANGIOGRAM N/A 12/17/2012  Procedure: LEFT HEART CATHETERIZATION WITH CORONARY ANGIOGRAM;  Surgeon: Laverda Page, MD;  Location: [facility] CATH LAB;  Service: Cardiovascular;  Laterality: N/A;  LOOP RECORDER INSERTION N/A 03/05/2018  Procedure: LOOP RECORDER INSERTION;  Surgeon: Evans Lance, MD;  Location: [facility] CV LAB;  Service: Cardiovascular;  Laterality: N/A;  LOWER EXTREMITY ANGIOGRAM N/A 12/17/2012  Procedure: LOWER EXTREMITY ANGIOGRAM;  Surgeon: Laverda Page, MD;  Location: [facility] CATH LAB;  Service: Cardiovascular;  Laterality: N/A;  TEE WITHOUT CARDIOVERSION N/A 02/28/2018  Procedure: TRANSESOPHAGEAL ECHOCARDIOGRAM (TEE);  Surgeon: Jerline Pain, MD;  Location: [facility] ENDOSCOPY;  Service: Cardiovascular;  Laterality: N/A;  TUBAL LIGATION   HPI: Pt is a 71 y.o. female who presenting with weakness and lightheadedness. MRI 5/11: Small acute to early subacute infarct in the left aspect of the  medulla. PMH: CVA, DM2, HTN, CKD3b, HLD.  No data recorded  Recommendations for follow up therapy are one component of a multi-disciplinary discharge planning process, led by the attending physician.  Recommendations may be updated based on patient status,  additional functional criteria and insurance authorization. Assessment / Plan / Recommendation   03/24/2022  11:59 AM Clinical Impressions Clinical Impression Pt presents with neurogenic pharyngeal dysphagia characterized by reduced tongue base retraction, reduced pharyngeal constriction, and reduced anterior laryngeal movement. She demonstrated incomplete epiglottic inversion, vallecular residue, pyriform sinus residue, posterior pharyngeal wall residue, and PES distension was reduced. Pt was unable to achieve complete epiglottic inversion with secondary swallows and the epiglottis was noted to move only to about a horizontal position. Penetration (PAS 3) and aspiration (PAS 7) of pyriform sinus residue was noted inconsistently with multiple consistencies. Pt's cough was effective in expelling the aspirate, but subsequent laryngeal invasion was noted thereafter due to inadequate laryngeal closure and pharyngeal pressure. Effortful swallows as well as a chin tuck posture both reduced pharyngeal residue, but not to a functional level. Residue was most significant with nectar thick liquids and with purees. Pt reported these to be the most noxious with regard to effort and pharyngeal "soreness/burning". Secondary swallows were notably less effective in facilitating pharyngeal clearance of purees and nectar thick liquids, but residue from these was reduced with a thin liquid wash. Esophageal sweep revealed some stasis and slowed movement of barium in  the distal esophagus. It is recommended that pt's NPO status be maintained and that short-term enteral nutrition be initiated. Pt may have ice chips after oral care to maintain the integrity of the oral mucosa and for use practice of dysphagia exercises. SLP will follow pt for dysphagia intervention. SLP Visit Diagnosis Dysphagia, pharyngeal phase (R13.13) Impact on safety and function Moderate aspiration risk;Severe aspiration risk     03/24/2022  11:59 AM Treatment  Recommendations Treatment Recommendations Therapy as outlined in treatment plan below     03/24/2022  11:59 AM Prognosis Prognosis for Safe Diet Advancement Good Barriers to Reach Goals Cognitive deficits   03/24/2022  11:59 AM Diet Recommendations SLP Diet Recommendations NPO;Ice chips PRN after oral care Medication Administration Via alternative means Postural Changes Seated upright at 90 degrees     03/24/2022  11:59 AM Other Recommendations Oral Care Recommendations Oral care BID Follow Up Recommendations -- Assistance recommended at discharge Intermittent Supervision/Assistance Functional Status Assessment Patient has had a recent decline in their functional status and demonstrates the ability to make significant improvements in function in a reasonable and predictable amount of time.   03/24/2022  11:59 AM Frequency and Duration  Speech Therapy Frequency (ACUTE ONLY) min 2x/week Treatment Duration 2 weeks     03/24/2022  11:59 AM Oral Phase Oral Phase [facility]    03/24/2022  11:59 AM Pharyngeal Phase Pharyngeal Phase Impaired Pharyngeal- Nectar Cup Reduced tongue base retraction;Reduced airway/laryngeal closure;Reduced anterior laryngeal mobility;Reduced epiglottic inversion;Pharyngeal residue - valleculae;Pharyngeal residue - pyriform;Reduced pharyngeal peristalsis;Pharyngeal residue - posterior pharnyx;Penetration/Apiration after swallow Pharyngeal Material enters airway, remains ABOVE vocal cords and not ejected out Pharyngeal- Thin Cup Reduced tongue base retraction;Reduced airway/laryngeal closure;Reduced anterior laryngeal mobility;Reduced epiglottic inversion;Pharyngeal residue - valleculae;Pharyngeal residue - pyriform;Reduced pharyngeal peristalsis;Pharyngeal residue - posterior pharnyx;Trace aspiration;Penetration/Aspiration during swallow;Penetration/Apiration after swallow Pharyngeal Material enters airway, passes BELOW cords and not ejected out despite cough attempt by patient;Material enters airway, remains  ABOVE vocal cords and not ejected out Pharyngeal- Thin Straw Reduced tongue base retraction;Reduced airway/laryngeal closure;Reduced anterior laryngeal mobility;Reduced epiglottic inversion;Pharyngeal residue - valleculae;Pharyngeal residue - pyriform;Reduced pharyngeal peristalsis;Pharyngeal residue - posterior pharnyx;Penetration/Aspiration during swallow;Penetration/Apiration after swallow Pharyngeal Material enters airway, remains ABOVE vocal cords and not ejected out;Material enters airway, passes BELOW cords and not ejected out despite cough attempt by patient Pharyngeal- Puree Reduced tongue base retraction;Reduced airway/laryngeal closure;Reduced anterior laryngeal mobility;Reduced epiglottic inversion;Pharyngeal residue - valleculae;Pharyngeal residue - pyriform;Reduced pharyngeal peristalsis;Pharyngeal residue - posterior pharnyx    03/24/2022  11:59 AM Cervical Esophageal Phase  Cervical Esophageal Phase Impaired Thin Cup Reduced cricopharyngeal relaxation Shanika I. Hardin Negus, [place], [place] Office number [phone] Pager [phone] Horton Marshall 03/24/2022, 1:13 PM                     ECHOCARDIOGRAM COMPLETE  Result Date: 03/24/2022    ECHOCARDIOGRAM REPORT   Patient Name:   VONITA CALLOWAY [facility] Date of Exam: 03/24/2022 Medical Rec #:  [id number]          Height:       62.0 in Accession #:    [id number]         Weight:       160.0 lb Date of Birth:  [birth year]          BSA:          1.739 m Patient Age:    84 years           BP:  186/72 mmHg Patient Gender: F                  HR:           78 bpm. Exam Location:  Inpatient Procedure: 2D Echo Indications:    stroke  History:        Patient has prior history of Echocardiogram examinations, most                 recent 02/28/2018. CAD, chronic kidney disease.; Risk                 Factors:Diabetes, Hypertension and Dyslipidemia.  Sonographer:    Kirvin Referring Phys: [id number] Muniz   1. Left ventricular ejection fraction, by estimation, is 60 to 65%. The left ventricle has normal function. The left ventricle has no regional wall motion abnormalities. There is mild left ventricular hypertrophy. Left ventricular diastolic parameters are consistent with Grade I diastolic dysfunction (impaired relaxation).  2. Right ventricular systolic function is normal. The right ventricular size is normal.  3. The mitral valve is normal in structure. No evidence of mitral valve regurgitation.  4. There is mild calcification of the aortic valve. Aortic valve regurgitation is not visualized.  5. The inferior vena cava is normal in size with greater than 50% respiratory variability, suggesting right atrial pressure of 3 mmHg. FINDINGS  Left Ventricle: Left ventricular ejection fraction, by estimation, is 60 to 65%. The left ventricle has normal function. The left ventricle has no regional wall motion abnormalities. The left ventricular internal cavity size was normal in size. There is  mild left ventricular hypertrophy. Left ventricular diastolic parameters are consistent with Grade I diastolic dysfunction (impaired relaxation). Right Ventricle: The right ventricular size is normal. No increase in right ventricular wall thickness. Right ventricular systolic function is normal. Left Atrium: Left atrial size was normal in size. Right Atrium: Right atrial size was normal in size. Pericardium: There is no evidence of pericardial effusion. Mitral Valve: The mitral valve is normal in structure. No evidence of mitral valve regurgitation. Tricuspid Valve: The tricuspid valve is normal in structure. Tricuspid valve regurgitation is not demonstrated. Aortic Valve: There is mild calcification of the aortic valve. Aortic valve regurgitation is not visualized. Pulmonic Valve: The pulmonic valve was normal in structure. Pulmonic valve regurgitation is not visualized. Aorta: The aortic root and ascending aorta are structurally  normal, with no evidence of dilitation. Venous: The inferior vena cava is normal in size with greater than 50% respiratory variability, suggesting right atrial pressure of 3 mmHg. IAS/Shunts: No atrial level shunt detected by color flow Doppler.  LEFT VENTRICLE PLAX 2D LVIDd:         4.20 cm   Diastology LVIDs:         2.60 cm   LV e' medial:    4.24 cm/s LV PW:         0.90 cm   LV E/e' medial:  16.2 LV IVS:        1.10 cm   LV e' lateral:   8.92 cm/s LVOT diam:     1.70 cm   LV E/e' lateral: 7.7 LV SV:         43 LV SV Index:   25 LVOT Area:     2.27 cm  RIGHT VENTRICLE             IVC RV S prime:     11.50 cm/s  IVC diam: 1.50 cm  LEFT ATRIUM             Index        RIGHT ATRIUM          Index LA diam:        3.30 cm 1.90 cm/m   RA Area:     9.46 cm LA Vol (A2C):   32.5 ml 18.69 ml/m  RA Volume:   17.80 ml 10.24 ml/m LA Vol (A4C):   34.0 ml 19.55 ml/m LA Biplane Vol: 35.1 ml 20.19 ml/m  AORTIC VALVE LVOT Vmax:   103.00 cm/s LVOT Vmean:  66.100 cm/s LVOT VTI:    0.190 m  AORTA Ao Root diam: 2.50 cm Ao Asc diam:  2.90 cm MITRAL VALVE MV Area (PHT): 3.37 cm     SHUNTS MV Decel Time: 225 msec     Systemic VTI:  0.19 m MV E velocity: 68.60 cm/s   Systemic Diam: 1.70 cm MV A velocity: 131.00 cm/s MV E/A ratio:  0.52 Landscape architect signed by Phineas Inches Signature Date/Time: 03/24/2022/11:51:27 AM    Final    VAS [place] LOWER EXTREMITY VENOUS (DVT)  Result Date: 03/29/2022  Lower Venous DVT Study Patient Name:  ASHANTIA AMARAL [facility]  Date of Exam:   03/29/2022 Medical Rec #: [id number]           Accession #:    [id number] Date of Birth: 08-Dec-1950           Patient Gender: F Patient Age:   41 years Exam Location:  [facility] Procedure:      VAS [place] LOWER EXTREMITY VENOUS (DVT) Referring Phys: Cornelius Moras XU --------------------------------------------------------------------------------  Indications: Pain.  Risk Factors: None identified. Limitations: Poor ultrasound/tissue interface. Comparison Study: No  prior studies. Performing Technologist: Oliver Hum RVT  Examination Guidelines: A complete evaluation includes B-mode imaging, spectral Doppler, color Doppler, and power Doppler as needed of all accessible portions of each vessel. Bilateral testing is considered an integral part of a complete examination. Limited examinations for reoccurring indications may be performed as noted. The reflux portion of the exam is performed with the patient in reverse Trendelenburg.  +---------+---------------+---------+-----------+----------+--------------+ RIGHT    CompressibilityPhasicitySpontaneityPropertiesThrombus Aging +---------+---------------+---------+-----------+----------+--------------+ CFV      Full           Yes      Yes                                 +---------+---------------+---------+-----------+----------+--------------+ SFJ      Full                                                        +---------+---------------+---------+-----------+----------+--------------+ FV Prox  Full                                                        +---------+---------------+---------+-----------+----------+--------------+ FV Mid   Full                                                        +---------+---------------+---------+-----------+----------+--------------+  FV DistalFull                                                        +---------+---------------+---------+-----------+----------+--------------+ PFV      Full                                                        +---------+---------------+---------+-----------+----------+--------------+ POP      Full           Yes      Yes                                 +---------+---------------+---------+-----------+----------+--------------+ PTV      Full                                                        +---------+---------------+---------+-----------+----------+--------------+ PERO     Full                                                         +---------+---------------+---------+-----------+----------+--------------+   +---------+---------------+---------+-----------+----------+--------------+ LEFT     CompressibilityPhasicitySpontaneityPropertiesThrombus Aging +---------+---------------+---------+-----------+----------+--------------+ CFV      Full           Yes      Yes                                 +---------+---------------+---------+-----------+----------+--------------+ SFJ      Full                                                        +---------+---------------+---------+-----------+----------+--------------+ FV Prox  Full                                                        +---------+---------------+---------+-----------+----------+--------------+ FV Mid   Full                                                        +---------+---------------+---------+-----------+----------+--------------+ FV DistalFull                                                        +---------+---------------+---------+-----------+----------+--------------+  PFV      Full                                                        +---------+---------------+---------+-----------+----------+--------------+ POP      Full           Yes      Yes                                 +---------+---------------+---------+-----------+----------+--------------+ PTV      Full                                                        +---------+---------------+---------+-----------+----------+--------------+ PERO     Full                                                        +---------+---------------+---------+-----------+----------+--------------+     Summary: RIGHT: - There is no evidence of deep vein thrombosis in the lower extremity. However, portions of this examination were limited- see technologist comments above.  - No cystic structure found in the popliteal fossa.   LEFT: - There is no evidence of deep vein thrombosis in the lower extremity. However, portions of this examination were limited- see technologist comments above.  - No cystic structure found in the popliteal fossa.  *See table(s) above for measurements and observations. Electronically signed by Jamelle Haring on 03/29/2022 at 9:19:17 PM.    Final     Labs:  CBC: Recent Labs    03/30/22 0344 03/31/22 0940 04/01/22 0052 04/01/22 1255 04/02/22 0235  WBC 4.9 6.4 8.9  --  11.8*  HGB 8.4* 8.7* 9.6* 9.5* 8.7*  HCT 26.3* 27.3* 30.0* 28.0* 27.0*  PLT 253 254 298  --  279    COAGS: Recent Labs    03/24/22 2341  INR 1.1  APTT 45*    BMP: Recent Labs    03/30/22 0344 03/31/22 0940 04/01/22 0052 04/01/22 1255 04/03/22 0232  NA 145 144 147* 144 139  K 3.7 3.7 3.8 4.9 4.0  CL 113* 108 109  --  105  CO2 '23 24 27  '$ --  26  GLUCOSE 210* 154* 83  --  117*  BUN 44* 34* 41*  --  36*  CALCIUM 8.9 9.0 9.2  --  9.1  CREATININE 1.54* 1.68* 1.89*  --  1.66*  GFRNONAA 36* 33* 28*  --  33*    LIVER FUNCTION TESTS: No results for input(s): BILITOT, AST, ALT, ALKPHOS, PROT, ALBUMIN in the last 8760 hours.  TUMOR MARKERS: No results for input(s): AFPTM, CEA, CA199, CHROMGRNA in the last 8760 hours.  Assessment and Plan:  CVA Dysphagia Aspiration Need for long term care Scheduled for G tube in IR soon Plans for trach placement today or tomorrow per chart We will wait for trach placement before G tube Risks and benefits image guided gastrostomy tube placement was discussed with the patient's dtr  [name] via phone including, but not limited to the need for a barium enema during the procedure, bleeding, infection, peritonitis and/or damage to adjacent structures.  All questions were answered, [name] is agreeable to proceed.  Consent signed and in chart.   Thank you for this interesting consult.  I greatly enjoyed meeting Salley Boxley and look forward to participating in their care.  A  copy of this report was sent to the requesting provider on this date.  Electronically Signed: Lavonia Drafts, PA-C 04/03/2022, 12:57 PM   I spent a total of 40 Minutes    in face to face in clinical consultation, greater than 50% of which was counseling/coordinating care for percutaneous gastric tube placement

## 2022-04-03 NOTE — Progress Notes (Signed)
SLP Cancellation Note  Patient Details Name: [name] MRN: [id number] DOB: Oct 25, 1951   Cancelled evaluations: New orders received for swallow/PMV evals. Pt underwent trach this afternoon; on vent.  Will follow for readiness.  [name] L. Tivis [place], [place] Office number [phone] Pager [phone]           [name] Laurice 04/03/2022, 3:02 PM

## 2022-04-03 NOTE — TOC Initial Note (Signed)
Transition of Care [facility]) - Initial/Assessment Note    Patient Details  Name: [name] MRN: [id number] Date of Birth: 09-29-1951  Transition of Care [facility]) CM/SW Contact:    Marilu Favre, RN Phone Number: 04/03/2022, 10:50 AM  Clinical Narrative:                 Patient intubated plan for trach and PEG Today.   NCM called daughter Marveen Reeks [phone] discussed LTACH. Choice given to [name]. [name] agreed to referral to [facility] but not [facility].   Referral made to [facility] with [facility]   Expected Discharge Plan: Long Term Acute Care (LTAC) Barriers to Discharge: Continued Medical Work up   Patient Goals and CMS Choice   CMS Medicare.gov Compare Post Acute Care list provided to:: Patient Represenative (must comment) (daughter Marveen Reeks [phone]) Choice offered to / list presented to : Adult Children  Expected Discharge Plan and Services Expected Discharge Plan: Long Term Acute Care (LTAC)   Discharge Planning Services: CM Consult Post Acute Care Choice: Long Term Acute Care (LTAC) Living arrangements for the past 2 months: Single Family Home                   DME Agency: NA       HH Arranged: NA          Prior Living Arrangements/Services Living arrangements for the past 2 months: Single Family Home Lives with:: Self          Need for Family Participation in Patient Care: Yes (Comment) Care giver support system in place?: Yes (comment)      Activities of Daily Living Home Assistive Devices/Equipment: CBG Meter ADL Screening (condition at time of admission) Patient's cognitive ability adequate to safely complete daily activities?: Yes Is the patient deaf or have difficulty hearing?: No Does the patient have difficulty seeing, even when wearing glasses/contacts?: No Does the patient have difficulty concentrating, remembering, or making decisions?: No Patient able to express need for assistance with ADLs?: Yes Does the patient have  difficulty dressing or bathing?: No Independently performs ADLs?: Yes (appropriate for developmental age) Does the patient have difficulty walking or climbing stairs?: No Weakness of Legs: None Weakness of Arms/Hands: None  Permission Sought/Granted                  Emotional Assessment   Attitude/Demeanor/Rapport: Unable to Assess Affect (typically observed): Unable to Assess Orientation: : Oriented to Self      Admission diagnosis:  Hyperglycemia [R73.9] CVA (cerebral vascular accident) ([place]) [I63.9] Near syncope [R55] Cerebrovascular accident (CVA), unspecified mechanism ([place]) [I63.9] Patient Active Problem List   Diagnosis Date Noted   Pulmonary edema 03/31/2022   Acute respiratory failure ([place]) 03/29/2022   Aspiration pneumonia ([place]) 03/29/2022   CVA (cerebral vascular accident) ([place]) 03/23/2022   Lymphadenopathy of head and neck 03/25/2019   CAD (coronary artery disease) 07/24/2018   Abdominal aortic aneurysm (AAA) without rupture ([place]) 07/24/2018   Abnormal uterine bleeding 05/22/2018   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 ([place]) 02/22/2018   Gingivitis 10/24/2017   Dysphagia due to recent stroke 01/16/2017   GERD (gastroesophageal reflux disease) 06/01/2015   Normocytic anemia 06/01/2015   Healthcare maintenance 06/01/2015   Vitamin D deficiency 06/01/2015   Stage 3b chronic kidney disease (CKD) ([place]) 03/01/2014   PVD (peripheral vascular disease) ([place]) 06/18/2013   Hypothyroidism 01/10/2007   HLD (hyperlipidemia) 01/10/2007   HTN (hypertension) 01/10/2007   History  of CVA (cerebrovascular accident) 01/10/2007   DM2 (diabetes mellitus, type 2) ([place]) 01/10/1989   PCP:  Buzzy Han, MD (Inactive) Pharmacy:   [id number] [place] [id number]: [phone]: [phone]    Social Determinants of Health (SDOH)  Interventions    Readmission Risk Interventions     View : No data to display.

## 2022-04-03 NOTE — Procedures (Signed)
Diagnostic Bronchoscopy  Judit Awad  [id number]  05-May-1951  Date:04/03/22  Time:2:45 PM   Provider Performing:Estefani Bateson Naomie Dean   Procedure: Diagnostic Bronchoscopy (43568)  Indication(s) Assist with direct visualization of tracheostomy placement  Consent Risks of the procedure as well as the alternatives and risks of each were explained to the patient and/or caregiver.  Consent for the procedure was obtained.   Anesthesia See separate tracheostomy note   Time Out Verified patient identification, verified procedure, site/side was marked, verified correct patient position, special equipment/implants available, medications/allergies/relevant history reviewed, required imaging and test results available.   Sterile Technique Usual hand hygiene, masks, gowns, and gloves were used   Procedure Description Bronchoscope advanced through endotracheal tube and into airway.  After suctioning out tracheal secretions, bronchoscope used to provide direct visualization of tracheostomy placement. Guidewire, dilators, tracheostomy all confirmed in trachea under direct visualization.    Complications/Tolerance None; patient tolerated the procedure well.   EBL None  Specimen(s) None  [name], DO 04/03/22 2:46 PM [facility]

## 2022-04-03 NOTE — Progress Notes (Signed)
[facility] Progress Note Patient Name: [name] DOB: 01-22-1951 MRN: [id number]   Date of Service  04/03/2022  HPI/Events of Note  Patient is NPO for a trach & PEG in the AM, she is on resistant scale  CBG + SSI Q 4 hourly coverage.  eICU Interventions  SSI coverage changed to sensitive scale and D 5 % LR gtt at 50 ml / hour ordered x 10 hours.        Jenny Omdahl U Merton Wadlow 04/03/2022, 1:20 AM

## 2022-04-03 NOTE — Progress Notes (Signed)
RT note- Patient is now awake, fio2 decreased back to 40%.

## 2022-04-03 NOTE — Progress Notes (Signed)
NAME:  [name], MRN:  [id number], DOB:  1951/08/26, LOS: 40 ADMISSION DATE:  03/23/2022, CONSULTATION DATE: 03/25/2022 REFERRING MD:  Jonetta Osgood, MD , CHIEF COMPLAINT: Increasing shortness of breath  History of Present Illness:  71 year old female with poorly controlled diabetes, hypertension, CKD stage IIIb and prior history of CVA who presented with vertigo and progressive dysphagia, she was noted to have left medullary acute stroke, was admitted to hospital service for further management, stroke team is following.  This afternoon patient started with increasing dysphagia and unable to protect her airway, she required frequent suctioning, she became hypoxic and her oxygen requirement went up, now she is on Ventimask, PCCM was consulted for help evaluation and management  Pertinent  Medical History   Past Medical History:  Diagnosis Date   Anemia    Arteriosclerotic cardiovascular disease    CKD (chronic kidney disease) stage 3, GFR 30-59 ml/min (HCC)    Claudication in peripheral vascular disease ([place])    CVA (cerebral vascular accident) ([place])    2012 and 2019-affected left side + dysarthria 2/2 severe R ACA and MCA stenosis   Epistaxis 03/29/2022   GERD (gastroesophageal reflux disease)    Hyperlipidemia    declines statins   Hypertension    Hypothyroidism    Type 2 diabetes mellitus ([place]) 2000   Vitamin D deficiency      Significant Hospital Events: Including procedures, antibiotic start and stop dates in addition to other pertinent events   5/11 admitted 5/14 intubated for cardiac arrest, likely 2/2 aspiration vs hypoxia, Unasyn started 5/17 awake. Following commands. Sig difficulty w/ glycemic control. Adjustments made to basal and ss dosing 5/18 awake. VTs low on PSV. Failed initial am SBT (sats 60s; reporting SOB). Got SMPG still no BM 5/19 pulmonary edema on cxr getting lasix. SSE for constipation.  Brief arrest thought 2/2 to vagal response while turning  for enema  5/20 extubated, reintubated for post extubation stridor .  Interim History / Subjective:  Hypoglycemic overnight while NPO, on D5LR C/o of being hungry, otherwise no complaints Plans for trach/ IR PEG today   Objective   Blood pressure (!) 133/95, pulse 85, temperature 98.1 F (36.7 C), temperature source Axillary, resp. rate 13, height '5\' 2"'$  (1.575 m), weight 74.8 kg, SpO2 100 %.    Vent Mode: PSV;CPAP FiO2 (%):  [40 %] 40 % Set Rate:  [20 bmp] 20 bmp Vt Set:  [400 mL] 400 mL PEEP:  [5 cmH20] 5 cmH20 Pressure Support:  [10 cmH20] 10 cmH20 Plateau Pressure:  [14 cmH20-16 cmH20] 15 cmH20   Intake/Output Summary (Last 24 hours) at 04/03/2022 0957 Last data filed at 04/03/2022 0500 Gross per 24 hour  Intake 1180.87 ml  Output 900 ml  Net 280.87 ml   Filed Weights   03/25/22 0141 03/27/22 0500 03/30/22 0500  Weight: 74.9 kg 74.2 kg 74.8 kg    Examination: General:  older female lying in bed in NAD HEENT: MM pink/moist, ETT/ OGT, R pupil 3, L 2, slight left slight droop Neuro: Awake, writing to communicate, MAE> no appreciated deficits  CV: rr, no murmur PULM:  non labored on PSV 10/5, lungs clear GI: soft, bs+, NT, purwick Extremities: warm/dry, no LE edema  Skin: no rashes   BG 64- 132 sCr 1.89> 1.66  Resolved Hospital Problem list   Epistaxis  Assessment & Plan:  Principal Problem:   CVA (cerebral vascular accident) ([place]) Active Problems:   Hypothyroidism   DM2 (diabetes mellitus, type  2) ([place])   HLD (hyperlipidemia)   HTN (hypertension)   Stage 3b chronic kidney disease (CKD) (HCC)   GERD (gastroesophageal reflux disease)   Normocytic anemia   Dysphagia due to recent stroke   CAD (coronary artery disease)   Acute respiratory failure (HCC)   Aspiration pneumonia (HCC)   Pulmonary edema   Acute respiratory failure with hypoxia requiring MV Aspiration pneumonia RLL, persistent infiltrates on imaging but clinically improved Dysphagia from  CVA Post-extubation stridor - no precedex given prior brady arrest  - completed unasyn 5/20 P:  - plans for trach and IR PEG today  - cont full MV support> likely will be able to wean off MV soon after trach.  Goal is to ATC - glycopyrrolate prn for oral secretions - VAP prevention protocol/ PPI  - PAD protocol> prn fentanyl/ versed  - eventual SLP when tolerating ATC  Medullary CVA with dysphagia and dysarthria - per Neuro - ongoing PT, OT - ASA, statin, can resume plavix after trach and PEG  Constipation - cont Senna, MiraLAX, lactulose TID   Hyperglycemia, resolved.  Hypoglycemic overnight while NPO - cont D5LR till able to resume Tfs - add back SSI if glucose > 180    AKI on CKD 3b - sCr stable, continue to trend renal indices  - strict I/Os - renally dose meds, avoid nephrotoxic meds  Hypernatremia - FWF on hold while NPO> Na 139 today so ok - trend on BMET  HLD - con't crestor--seems to be tolerating (previously had listed intolerance in the chart) - con't gemfibrozil   Anemia - CBC in am, transfuse for Hb <7  At risk of malnutrition - IR PEG today -not a good candidate for cortrak due to previous severe epistaxis   Hypothyroidism - con't synthroid   Best Practice (right click and "Reselect all SmartList Selections" daily)   Diet/type: tubefeeds- on hold for PEG.  Avoid cortrak given prior severe epistaxis DVT prophylaxis: LMWH GI prophylaxis: H2B Lines: N/A Foley:  N/A Code Status:  full code Last date of multidisciplinary goals of care discussion [5/20, daughter updated)  CCT: 40 mins   Kennieth Rad, ACNP [facility] 04/03/2022, 10:23 AM  See Amion for pager If no response to pager, please call PCCM consult pager After 7:00 pm call [place]

## 2022-04-03 NOTE — Progress Notes (Signed)
Physical Therapy Treatment Patient Details Name: [name] MRN: [id number] DOB: [birth year] Today's Date: 04/03/2022   History of Present Illness Pt is a 71 year old woman admitted on 03/23/22 with dizziness, decreased balance and hoarse voice. + stroke L medulla. Patient coded and required CPR and intubation on 5/14. PMH: CVA, DM@, CKDIII, PVD, HTN, hypothyroidism.    PT Comments    Pt progressing well, notable improvement in cognition with pt following commands and using gestures and facial expressions to communicate. Pt expresses desire to eat after trach. With initial sitting, pt with heavy L lean. Worked on wt shifting in sitting, reaching for foot of bed, and WB'ing on R elbow, and pt progressed to midline sitting with min-guard and vc's. Pt also with L lean in standing but less so than sitting. Performed pregait activities along bedside with HHA and with RW with min A +2 for safety. Continue to recommend AIR when pt appropriate. PT will continue to follow.    Recommendations for follow up therapy are one component of a multi-disciplinary discharge planning process, led by the attending physician.  Recommendations may be updated based on patient status, additional functional criteria and insurance authorization.  Follow Up Recommendations  Acute inpatient rehab (3hours/day)     Assistance Recommended at Discharge Frequent or constant Supervision/Assistance  Patient can return home with the following Two people to help with walking and/or transfers;A lot of help with bathing/dressing/bathroom;Assist for transportation;Help with stairs or ramp for entrance   Equipment Recommendations  Rolling walker (2 wheels)    Recommendations for Other Services Rehab consult     Precautions / Restrictions Precautions Precautions: Fall Precaution Comments: cortrack, vent Restrictions Weight Bearing Restrictions: No     Mobility  Bed Mobility Overal bed mobility: Needs Assistance Bed  Mobility: Supine to Sit, Sit to Supine     Supine to sit: Mod assist, +2 for safety/equipment Sit to supine: Mod assist, +2 for safety/equipment   General bed mobility comments: cues for sequencing and assist for trunk, initially with severe L leaning, able to correct and maintain with min G    Transfers Overall transfer level: Needs assistance Equipment used: Rolling walker (2 wheels) Transfers: Sit to/from Stand Sit to Stand: Min assist, +2 physical assistance, +2 safety/equipment           General transfer comment: stood at bed side 2x, first with 2 person hand hold and the second with RW. Min A for L lean. Did not go to chair as pt is supposed to go down for trach.    Ambulation/Gait             Pre-gait activities: worked on Research scientist (physical sciences) along bedside. Wt shifting R and L. Also reaching in standing     Stairs             Wheelchair Mobility    Modified Rankin (Stroke Patients Only) Modified Rankin (Stroke Patients Only) Pre-Morbid Rankin Score: No symptoms Modified Rankin: Moderately severe disability     Balance Overall balance assessment: Needs assistance Sitting-balance support: Feet supported Sitting balance-Leahy Scale: Poor Sitting balance - Comments: L leaning and pushing with R - however, much improved and able to sustain midline with cues Postural control: Left lateral lean Standing balance support: Bilateral upper extremity supported, During functional activity Standing balance-Leahy Scale: Poor Standing balance comment: requires min hand held assist, mod at times  Cognition Arousal/Alertness: Awake/alert Behavior During Therapy: Anxious Overall Cognitive Status: Impaired/Different from baseline Area of Impairment: Attention, Following commands, Safety/judgement, Awareness, Problem solving                   Current Attention Level: [facility]   Following Commands: Follows one step commands  consistently Safety/Judgement: Decreased awareness of deficits, Decreased awareness of safety Awareness: Emergent Problem Solving: Slow processing General Comments: pt's cognition is much improved this date, she is communicating appropriately and effectively with miming and pointing. All behaviors are approrpiate. Pt eager to participate. following all commands with increased time for processing        Exercises      General Comments General comments (skin integrity, edema, etc.): VSS on vent, communicating well with gestures and facial expressions      Pertinent Vitals/Pain Pain Assessment Pain Assessment: Faces Faces Pain Scale: Hurts a little bit Pain Location: generalized with mobility Pain Descriptors / Indicators: Grimacing, Guarding Pain Intervention(s): Monitored during session    Home Living                          Prior Function            PT Goals (current goals can now be found in the care plan section) Acute Rehab PT Goals Patient Stated Goal: to get better PT Goal Formulation: With patient/family Time For Goal Achievement: 04/08/22 Potential to Achieve Goals: Good Progress towards PT goals: Progressing toward goals    Frequency    Min 4X/week      PT Plan Current plan remains appropriate    Co-evaluation PT/OT/SLP Co-Evaluation/Treatment: Yes Reason for Co-Treatment: Complexity of the patient's impairments (multi-system involvement);Necessary to address cognition/behavior during functional activity;For patient/therapist safety PT goals addressed during session: Mobility/safety with mobility;Balance;Proper use of DME OT goals addressed during session: ADL's and self-care      AM-PAC PT "6 Clicks" Mobility   Outcome Measure  Help needed turning from your back to your side while in a flat bed without using bedrails?: A Lot Help needed moving from lying on your back to sitting on the side of a flat bed without using bedrails?: A  Lot Help needed moving to and from a bed to a chair (including a wheelchair)?: A Lot Help needed standing up from a chair using your arms (e.g., wheelchair or bedside chair)?: Total Help needed to walk in hospital room?: Total Help needed climbing 3-5 steps with a railing? : Total 6 Click Score: 9    End of Session Equipment Utilized During Treatment: Gait belt Activity Tolerance: Patient tolerated treatment well Patient left: with call bell/phone within reach;in bed Nurse Communication: Mobility status PT Visit Diagnosis: Other symptoms and signs involving the nervous system (R29.898);Dizziness and giddiness (R42);Hemiplegia and hemiparesis;Other abnormalities of gait and mobility (R26.89) Hemiplegia - Right/Left: Right Hemiplegia - dominant/non-dominant: Dominant Hemiplegia - caused by: Cerebral infarction     Time: 6384-5364 PT Time Calculation (min) (ACUTE ONLY): 25 min  Charges:  $Gait Training: 8-22 mins                     [place]  Pager [phone] Office [place] 04/03/2022, 1:17 PM

## 2022-04-03 NOTE — Procedures (Signed)
Percutaneous Tracheostomy Procedure Note   [name]  [id number]  [birth year]  Date:04/03/22  Time:2:46 PM   Provider Performing:Cornesha Radziewicz  Procedure: Percutaneous Tracheostomy with Bronchoscopic Guidance (31600)  Indication(s) Acute respiratory failure  Consent Risks of the procedure as well as the alternatives and risks of each were explained to the patient and/or caregiver.  Consent for the procedure was obtained.  Anesthesia Etomidate, Versed, Fentanyl, Vecuronium   Time Out Verified patient identification, verified procedure, site/side was marked, verified correct patient position, special equipment/implants available, medications/allergies/relevant history reviewed, required imaging and test results available.   Sterile Technique Maximal sterile technique including sterile barrier drape, hand hygiene, sterile gown, sterile gloves, mask, hair covering.    Procedure Description Appropriate anatomy identified by palpation.  Patient's neck prepped and draped in sterile fashion.  1% lidocaine with epinephrine was used to anesthetize skin overlying neck.  1.5cm incision made and blunt dissection performed until tracheal rings could be easily palpated.   Then a size 6 Shiley tracheostomy was placed under bronchoscopic visualization using usual Seldinger technique and serial dilation.   Bronchoscope confirmed placement above the carina.  Tracheostomy was sutured in place with adhesive pad to protect skin under pressure.    Patient connected to ventilator.   Complications/Tolerance None; patient tolerated the procedure well. Chest X-ray is ordered to confirm no post-procedural complication.   EBL Minimal   Specimen(s) None

## 2022-04-04 ENCOUNTER — Inpatient Hospital Stay (HOSPITAL_COMMUNITY): Payer: Medicare Other

## 2022-04-04 DIAGNOSIS — J9601 Acute respiratory failure with hypoxia: Secondary | ICD-10-CM | POA: Diagnosis not present

## 2022-04-04 DIAGNOSIS — E1159 Type 2 diabetes mellitus with other circulatory complications: Secondary | ICD-10-CM | POA: Diagnosis not present

## 2022-04-04 DIAGNOSIS — J690 Pneumonitis due to inhalation of food and vomit: Secondary | ICD-10-CM | POA: Diagnosis not present

## 2022-04-04 DIAGNOSIS — I63212 Cerebral infarction due to unspecified occlusion or stenosis of left vertebral arteries: Secondary | ICD-10-CM | POA: Diagnosis not present

## 2022-04-04 HISTORY — PX: IR GASTROSTOMY TUBE MOD SED: IMG625

## 2022-04-04 LAB — BASIC METABOLIC PANEL
Anion gap: 7 (ref 5–15)
BUN: 31 mg/dL — ABNORMAL HIGH (ref 8–23)
CO2: 25 mmol/L (ref 22–32)
Calcium: 9 mg/dL (ref 8.9–10.3)
Chloride: 104 mmol/L (ref 98–111)
Creatinine, Ser: 1.57 mg/dL — ABNORMAL HIGH (ref 0.44–1.00)
GFR, Estimated: 35 mL/min — ABNORMAL LOW (ref >60)
Glucose, Bld: 169 mg/dL — ABNORMAL HIGH (ref 70–99)
Potassium: 4.1 mmol/L (ref 3.5–5.1)
Sodium: 136 mmol/L (ref 135–145)

## 2022-04-04 LAB — CBC
HCT: 28 % — ABNORMAL LOW (ref 36.0–46.0)
Hemoglobin: 9.2 g/dL — ABNORMAL LOW (ref 12.0–15.0)
MCH: 29 pg (ref 26.0–34.0)
MCHC: 32.9 g/dL (ref 30.0–36.0)
MCV: 88.3 fL (ref 80.0–100.0)
Platelets: 319 10*3/uL (ref 150–400)
RBC: 3.17 MIL/uL — ABNORMAL LOW (ref 3.87–5.11)
RDW: 14.1 % (ref 11.5–15.5)
WBC: 8.8 10*3/uL (ref 4.0–10.5)
nRBC: 0 % (ref 0.0–0.2)

## 2022-04-04 LAB — GLUCOSE, CAPILLARY
Glucose-Capillary: 158 mg/dL — ABNORMAL HIGH (ref 70–99)
Glucose-Capillary: 146 mg/dL — ABNORMAL HIGH (ref 70–99)
Glucose-Capillary: 175 mg/dL — ABNORMAL HIGH (ref 70–99)
Glucose-Capillary: 163 mg/dL — ABNORMAL HIGH (ref 70–99)
Glucose-Capillary: 151 mg/dL — ABNORMAL HIGH (ref 70–99)

## 2022-04-04 MED ORDER — MIDAZOLAM HCL 2 MG/2ML IJ SOLN
INTRAMUSCULAR | Status: AC
  Filled 2022-04-04: qty 2

## 2022-04-04 MED ORDER — GLUCAGON HCL RDNA (DIAGNOSTIC) 1 MG IJ SOLR
INTRAMUSCULAR | Status: AC
  Filled 2022-04-04: qty 1

## 2022-04-04 MED ORDER — GLUCAGON HCL (RDNA) 1 MG IJ SOLR
INTRAMUSCULAR | Status: AC | PRN
Start: 2022-04-04 — End: 2022-04-04
  Administered 2022-04-04: .5 mg via INTRAVENOUS

## 2022-04-04 MED ORDER — INSULIN ASPART 100 UNIT/ML IJ SOLN
0.0000 [IU] | INTRAMUSCULAR | Status: DC
  Administered 2022-04-04 (×2): 1 [IU] via SUBCUTANEOUS

## 2022-04-04 MED ORDER — MIDAZOLAM HCL 2 MG/2ML IJ SOLN
INTRAMUSCULAR | Status: AC | PRN
  Administered 2022-04-04: 1 mg via INTRAVENOUS

## 2022-04-04 MED ORDER — CEFAZOLIN SODIUM-DEXTROSE 2-4 GM/100ML-% IV SOLN: 2.0000 g | Freq: Once | INTRAVENOUS | Status: AC

## 2022-04-04 MED ORDER — CEFAZOLIN SODIUM-DEXTROSE 2-4 GM/100ML-% IV SOLN
INTRAVENOUS | Status: AC
  Administered 2022-04-04: 2 g via INTRAVENOUS
  Filled 2022-04-04: qty 100

## 2022-04-04 MED ORDER — LIDOCAINE HCL 1 % IJ SOLN
INTRAMUSCULAR | Status: AC
  Filled 2022-04-04: qty 20

## 2022-04-04 MED ORDER — IOHEXOL 300 MG/ML  SOLN
100.0000 mL | Freq: Once | INTRAMUSCULAR | Status: AC | PRN
  Administered 2022-04-04: 10 mL

## 2022-04-04 MED ORDER — FENTANYL CITRATE (PF) 100 MCG/2ML IJ SOLN
INTRAMUSCULAR | Status: AC
  Filled 2022-04-04: qty 2

## 2022-04-04 MED ORDER — FENTANYL CITRATE (PF) 100 MCG/2ML IJ SOLN
INTRAMUSCULAR | Status: AC | PRN
  Administered 2022-04-04 (×2): 25 ug via INTRAVENOUS

## 2022-04-04 MED FILL — Medication: Qty: 1 | Status: AC

## 2022-04-04 NOTE — Progress Notes (Signed)
PT Cancellation Note  Patient Details Name: [name] MRN: [id number] DOB: [birth year]   Cancelled Treatment:    Reason Eval/Treat Not Completed: Patient at procedure or test/unavailable. Pt in radiology having a PEG tube placed.   [place] 04/04/2022, 11:30 AM [place] Office [phone]

## 2022-04-04 NOTE — Progress Notes (Signed)
Inpatient Diabetes Program Recommendations  AACE/ADA: New Consensus Statement on Inpatient Glycemic Control   Target Ranges:  Prepandial:   less than 140 mg/dL      Peak postprandial:   less than 180 mg/dL (1-2 hours)      Critically ill patients:  140 - 180 mg/dL    Latest Reference Range & Units 04/04/22 03:16 04/04/22 07:52  Glucose-Capillary 70 - 99 mg/dL 158 (H) 146 (H)    Latest Reference Range & Units 04/03/22 07:27 04/03/22 11:31 04/03/22 15:23 04/03/22 19:11 04/03/22 22:59  Glucose-Capillary 70 - 99 mg/dL 132 (H) 126 (H) 190 (H) 182 (H) 157 (H)   Review of Glycemic Control  Diabetes history: DM2 Outpatient Diabetes medications: Lantus 26 units BID Current orders for Inpatient glycemic control: Semglee 15 units daily  Inpatient Diabetes Program Recommendations:    Insulin: Please consider ordering CBGs Q4H with Novolog 0-9 units Q4H.  Thanks, Barnie Alderman, RN, MSN, CDE Diabetes Coordinator Inpatient Diabetes Program [phone] (Team Pager from 8am to 5pm)

## 2022-04-04 NOTE — Progress Notes (Signed)
SLP Cancellation Note  Patient Details Name: [name] MRN: [id number] DOB: May 25, 1951   Cancelled treatment:       Reason Eval/Treat Not Completed: Patient at procedure or test/unavailable- back on vent and on way to IR for PEG.  Will f/u for PMV eval and swallow eval when appropriate.  Divine Hansley L. Tivis Ringer, Early CCC/SLP Acute Rehabilitation Services Office number [phone] Pager [phone]    Assunta Curtis 04/04/2022, 10:51 AM

## 2022-04-04 NOTE — Progress Notes (Signed)
Pt transported from IR suite 1 to 4N15 on full support vent settings. RT, RN and transport accompanied patient. No complications during.

## 2022-04-04 NOTE — Procedures (Signed)
Interventional Radiology Procedure Note  Procedure: FLUORO 24 FR GTUBE    Complications: None  Estimated Blood Loss:  0  Findings: FULL USE TOMORROW    M. Daryll Brod, MD

## 2022-04-04 NOTE — Progress Notes (Addendum)
STROKE TEAM PROGRESS NOTE   INTERVAL HISTORY Patient is seen in her room with no family at bedside.  She has been hemodynamically stable and her neurological exam is unchanged.  Tracheostomy was placed yesterday, and PEG tube was placed today.  Patient is currently weaning off ventilatory support.  Vital signs are stable.   Vitals:   04/04/22 1115 04/04/22 1120 04/04/22 1125 04/04/22 1130  BP: (!) 143/69 137/70 (!) 157/71 129/62  Pulse: 89 88 84 84  Resp: '12 12 12 14  '$ Temp:      TempSrc:      SpO2: 100% 100% 100%   Weight:      Height:       CBC:  Recent Labs  Lab 04/02/22 0235 04/04/22 0402  WBC 11.8* 8.8  HGB 8.7* 9.2*  HCT 27.0* 28.0*  MCV 90.3 88.3  PLT 279 834    Basic Metabolic Panel:  Recent Labs  Lab 03/30/22 0344 03/31/22 0940 04/01/22 0052 04/03/22 0232 04/04/22 0402  NA 145 144   < > 139 136  K 3.7 3.7   < > 4.0 4.1  CL 113* 108   < > 105 104  CO2 23 24   < > 26 25  GLUCOSE 210* 154*   < > 117* 169*  BUN 44* 34*   < > 36* 31*  CREATININE 1.54* 1.68*   < > 1.66* 1.57*  CALCIUM 8.9 9.0   < > 9.1 9.0  MG 2.6* 2.4  --   --   --   PHOS 3.2 4.1  --   --   --    < > = values in this interval not displayed.    Lipid Panel:  No results for input(s): CHOL, TRIG, HDL, CHOLHDL, VLDL, LDLCALC in the last 168 hours. HgbA1c:  No results for input(s): HGBA1C in the last 168 hours. Urine Drug Screen: No results for input(s): LABOPIA, COCAINSCRNUR, LABBENZ, AMPHETMU, THCU, LABBARB in the last 168 hours.  Alcohol Level No results for input(s): ETH in the last 168 hours.  IMAGING past 24 hours DG Chest Port 1 View  Result Date: 04/03/2022 CLINICAL DATA:  Status post tracheostomy. EXAM: PORTABLE CHEST 1 VIEW COMPARISON:  Apr 01, 2022. FINDINGS: Tracheostomy tube is in grossly good position. Stable cardiomediastinal silhouette. Mild bibasilar subsegmental atelectasis is noted. IMPRESSION: Tracheostomy tube in grossly good position. Electronically Signed   By: Marijo Conception M.D.   On: 04/03/2022 15:21    PHYSICAL EXAM General:  Alert, well-developed, well-nourished, elderly African-American lady with tracheostomy in no acute distress Respiratory:  on vent.  NEURO:  Awake alert, on trach collar, able to follow commands and nods yes/no to questions.PERRL. Right sided nystagmus, unable to abduct left eye. blink to threat b/l. Left facial droop present Motor: non focal. Able to lift all ext off bed. No drift. Dysmetria present in LUE Sensory: intact to LT throughout.     ASSESSMENT/PLAN [name] is a 71 y.o. 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  MRI reveals a left medullary stroke.  On 5/14, patient was intubated due to respiratory distress.    5/20: Extubated then required reintubation: plan for trach/PEG 5/21: Scheduled for PEG tomorrow. CT abd this am.  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 Short segmental occlusion MRI  Small acute to early subacute infarct in left aspect of the medulla, chronic white matter microangiopathy MRA head and neck  high grade narrowing of left V4 segment, moderate narrowing of bilateral MCA Repeat MRI 5/13 stable left medullary infarct 2D Echo EF 64-40%, grade 1 diastolic dysfunction, no atrial level shunt Loop recorder interrogation no A-fib LDL 142 HgbA1c 9.5 VTE prophylaxis - lovenox On Plavix and pletal prior to admission, now on aspirin 325 mg per tube.  Will hold off on Brillinta given need for trach/PEG.  Therapy recommendations:  CIR Disposition:  pending  Respiratory failure Aspiration  Pt choked on saliva  Likely aspirated, on unasyn  CCM on board Emergent intubated 5/14 Trach placed 5/22  Hx of stroke First one in 2004 and  on plavix 01/2011 right CC stroke with left sided weakness, balance issue and slurry speech - residue of walking with hopping on the left side - on plavix and pletal 02/2018 admitted for left-sided weakness, left upper extremity muscle spasm, headache.  MRI showed right CR infarct.  MRA and carotid Doppler negative.  EF 60 to 65%.  LDL 87, A1c 8.9.  Discharged on Plavix and Pletal and Lopid.  Loop recorder was placed after discharge.  Cardiac arrest x 2 5/14 patient choked on saliva -> desaturation-> bradycardia-> asystole-> CPR->ROSC in 2 to 3 minutes 5/19 left decubitus positioning -> asystole -> CPR -> ROSC within 1 minute -> followed with seizure-like activity but more consistent with convulsive syncope  Hypertension Home meds:  amlodipine 10 mg daily, losartan-HCTZ 100-25 mg daily Stable Long-term BP goal normotensive  Hyperlipidemia Home meds:  gemfibrozil 600 mg daily, resumed in hospital LDL 142, goal < 70 High intensity statin not indicated due to previous intolerance Consider lipid clinic referral  Diabetes type II Uncontrolled Home meds:  lantus insulin 26 units BID HgbA1c 9.5, goal < 7.0 Now on insulin CBGs SSI Close PCP follow up for better DM control.  Dysphagia Status post cortrak -> nosebleed -> cortrak removed Has OG tube now On TF PEG placed 5/23  Other Stroke Risk Factors Advanced Age >/= 40  Former cigarette smoker Hx stroke  AKI on CKD3 Cre 1.38-1.46-1.99-1.72-1.68-1.89-> 1.66-> 1.57 CCM on board Avoid contrast when possible Renally dose medications as clinically ventilatory support.  He is short-lived  Left chest wall pain EKG no ST-T changes Chest x-ray unremarkable Likely related to chest compression on 5/14  B/l LE numbness Bilateral LE venous Doppler negative for DVT Supportive care  Other Active Problems Hypothyroidism, continue home synthroid Constipation  Hospital day # [place] , MSN, AGACNP-BC [facility] See Amion for schedule and pager information 04/04/2022 12:47 PM   I have personally obtained history,examined this patient, reviewed notes, independently viewed imaging studies, participated in medical decision making and plan of care.ROS completed by me personally and pertinent positives fully documented  I have made any additions or clarifications directly to the above note. Agree with note above.  Patient had tracheostomy done yesterday and procedure was uneventful.  She is breathing comfortably on trach collar and will likely be off ventilatory support soon.  Plan for PEG tube later today.  We will start her on dual antiplatelet therapy after the PEG tube when safe.  She may also consider possible participation in the [place] study for stroke prevention in patients with intracranial stenosis.  Discussed with Dr. Tacy Learn critical care medicine.This patient is critically ill and at significant risk  of neurological worsening, death and care requires constant monitoring of vital signs, hemodynamics,respiratory and cardiac monitoring, extensive review of multiple databases, frequent neurological assessment, discussion with family, other specialists and medical decision making of high complexity.I have made any additions or clarifications directly to the above note.This critical care time does not reflect procedure time, or teaching time or supervisory time of PA/NP/Med Resident etc but could involve care discussion time.  I spent 30 minutes of neurocritical care time  in the care of  this patient.      Antony Contras, MD Medical Director [facility] Pager: [phone] 04/04/2022 1:20 PM   To contact Stroke Continuity provider, please refer to [email or web address] After hours, contact General Neurology

## 2022-04-04 NOTE — Progress Notes (Signed)
NAME:  [name], MRN:  [id number], DOB:  [birth year], LOS: 12 ADMISSION DATE:  03/23/2022, CONSULTATION DATE: 03/25/2022 REFERRING MD:  Jonetta Osgood, MD , CHIEF COMPLAINT: Increasing shortness of breath  History of Present Illness:  71 year old female with poorly controlled diabetes, hypertension, CKD stage IIIb and prior history of CVA who presented with vertigo and progressive dysphagia, she was noted to have left medullary acute stroke, was admitted to hospital service for further management, stroke team is following.  This afternoon patient started with increasing dysphagia and unable to protect her airway, she required frequent suctioning, she became hypoxic and her oxygen requirement went up, now she is on Ventimask, PCCM was consulted for help evaluation and management  Pertinent  Medical History   Past Medical History:  Diagnosis Date   Anemia    Arteriosclerotic cardiovascular disease    CKD (chronic kidney disease) stage 3, GFR 30-59 ml/min (HCC)    Claudication in peripheral vascular disease ([place])    CVA (cerebral vascular accident) ([place])    2012 and 2019-affected left side + dysarthria 2/2 severe R ACA and MCA stenosis   Epistaxis 03/29/2022   GERD (gastroesophageal reflux disease)    Hyperlipidemia    declines statins   Hypertension    Hypothyroidism    Type 2 diabetes mellitus ([place]) 2000   Vitamin D deficiency      Significant Hospital Events: Including procedures, antibiotic start and stop dates in addition to other pertinent events   5/11 admitted 5/14 intubated for cardiac arrest, likely 2/2 aspiration vs hypoxia, Unasyn started 5/17 awake. Following commands. Sig difficulty w/ glycemic control. Adjustments made to basal and ss dosing 5/18 awake. VTs low on PSV. Failed initial am SBT (sats 60s; reporting SOB). Got SMPG still no BM 5/19 pulmonary edema on cxr getting lasix. SSE for constipation.  Brief arrest thought 2/2 to vagal response while turning  for enema  5/20 extubated, reintubated for post extubation stridor 5/22 trach by CCM  Interim History / Subjective:  Plans for IR PEG today Noted to have some stool on her purwick  Objective   Blood pressure (!) 142/70, pulse 85, temperature 98.6 F (37 C), temperature source Axillary, resp. rate 20, height '5\' 2"'$  (1.575 m), weight 74.8 kg, SpO2 100 %.    Vent Mode: PRVC FiO2 (%):  [40 %-100 %] 40 % Set Rate:  [20 bmp] 20 bmp Vt Set:  [400 mL] 400 mL PEEP:  [5 cmH20] 5 cmH20 Pressure Support:  [10 cmH20] 10 cmH20 Plateau Pressure:  [15 cmH20-16 cmH20] 15 cmH20   Intake/Output Summary (Last 24 hours) at 04/04/2022 0731 Last data filed at 04/04/2022 0000 Gross per 24 hour  Intake 340.25 ml  Output 625 ml  Net -284.75 ml   Filed Weights   03/25/22 0141 03/27/22 0500 03/30/22 0500  Weight: 74.9 kg 74.2 kg 74.8 kg   Examination: General:  Older female lying in bed in NAD HEENT: MM pink/moist, R pupil 3, L pupil 2, left facial droop, midline sutured trach site wnl Neuro: awake, f/c, MAE CV:  rr, NSR, no murmur PULM:  non labored, full MV support, CTA, small amount of thin secretions GI: soft, bs+, ND, purwick Extremities: warm/dry, no LE edema  Skin: no rashes  BG 182> 146 over last 12 hrs sCr 1.89> 1.66> 1.57 Uop > 975 + unmeasured UO, stool x 1  Resolved Hospital Problem list   Epistaxis  Assessment & Plan:  Principal Problem:   CVA (cerebral vascular accident) (  [place]) Active Problems:   Hypothyroidism   DM2 (diabetes mellitus, type 2) (HCC)   HLD (hyperlipidemia)   HTN (hypertension)   Stage 3b chronic kidney disease (CKD) (HCC)   GERD (gastroesophageal reflux disease)   Normocytic anemia   Dysphagia due to recent stroke   CAD (coronary artery disease)   Acute respiratory failure (HCC)   Aspiration pneumonia (HCC)   Pulmonary edema   Acute respiratory failure with hypoxia requiring MV Aspiration pneumonia RLL, persistent infiltrates on imaging but clinically  improved Dysphagia from CVA Post-extubation stridor - no precedex given prior brady arrest  - completed unasyn 5/20 P:  - s/p trach 5/22 - cont full MV support> daily PSV trials> goal to ATC tolerating today so far - glycopyrrolate prn for oral secretions - VAP prevention protocol/ PPI  - PAD protocol> prn fentanyl/ versed > minimize as able - SLP following - PEG per IR today   Medullary CVA with dysphagia and dysarthria - per Neuro - ongoing PT, OT - ASA, statin, can resume plavix after trach and PEG - consult CIR for placement once/ if tolerating ATC trials otherwise consider LTAC   Constipation - cont Senna, MiraLAX, lactulose TID   Hyperglycemia, resolved.  Hypoglycemic overnight while NPO - cont D5LR till able to resume Tfs - add back very sensitive SSI     AKI on CKD 3b - sCr stable, continue to trend renal indices which continue to improve - strict I/Os - renally dose meds, avoid nephrotoxic meds   Hypernatremia - FWF on hold > Na 136 - trend on BMET   HLD - con't crestor--seems to be tolerating (previously had listed intolerance in the chart) - con't gemfibrozil    Anemia - trend CBC, transfuse for Hb <7   At risk of malnutrition - IR PEG today -not a good candidate for cortrak due to previous severe epistaxis    Hypothyroidism - con't synthroid   Best Practice (right click and "Reselect all SmartList Selections" daily)   Diet/type: tubefeeds- on hold for PEG.  Avoid cortrak given prior severe epistaxis DVT prophylaxis: LMWH GI prophylaxis: H2B Lines: N/A Foley:  N/A Code Status:  full code Last date of multidisciplinary goals of care discussion [5/20, daughter updated)  CCT: 27 mins   Kennieth Rad, ACNP [facility] 04/04/2022, 7:31 AM  See Amion for pager If no response to pager, please call PCCM consult pager After 7:00 pm call [place]

## 2022-04-04 NOTE — Progress Notes (Signed)
Physical Therapy Treatment Patient Details Name: [name] MRN: [id number] DOB: Jan 22, 1951 Today's Date: 04/04/2022   History of Present Illness Pt is a 71 year old woman admitted on 03/23/22 with dizziness, decreased balance and hoarse voice. + stroke L medulla. Patient coded and required CPR and intubation on 5/14. Trach placed 5/22. PEG placed 5/23.  PMH: CVA, DM@, CKDIII, PVD, HTN, hypothyroidism.    PT Comments    Pt making steady progress and able to get OOB to chair with assistance. Pt with significant impairments in balance and coordination. Continue to recommend acute inpatient rehab for further rehab.    Recommendations for follow up therapy are one component of a multi-disciplinary discharge planning process, led by the attending physician.  Recommendations may be updated based on patient status, additional functional criteria and insurance authorization.  Follow Up Recommendations  Acute inpatient rehab (3hours/day)     Assistance Recommended at Discharge Frequent or constant Supervision/Assistance  Patient can return home with the following Two people to help with walking and/or transfers;A lot of help with bathing/dressing/bathroom;Assist for transportation;Help with stairs or ramp for entrance   Equipment Recommendations  Rolling walker (2 wheels)    Recommendations for Other Services Rehab consult     Precautions / Restrictions Precautions Precautions: Fall Precaution Comments: cortrack, vent Restrictions Weight Bearing Restrictions: No     Mobility  Bed Mobility Overal bed mobility: Needs Assistance Bed Mobility: Supine to Sit     Supine to sit: Mod assist, +2 for safety/equipment Sit to supine: Mod assist, +2 for safety/equipment   General bed mobility comments: Assist to elevate trunk into sitting and bring hips to EOB.    Transfers Overall transfer level: Needs assistance Equipment used: Rolling walker (2 wheels) Transfers: Sit to/from  Stand Sit to Stand: Min assist, +2 physical assistance   Step pivot transfers: +2 physical assistance, Mod assist       General transfer comment: Assist to bring hips up and for balance. Pt with left lateral lean. Assist for balance and support with pivotal steps bed to chair with walker with continued left lateral lean    Ambulation/Gait                   Stairs             Wheelchair Mobility    Modified Rankin (Stroke Patients Only) Modified Rankin (Stroke Patients Only) Pre-Morbid Rankin Score: No symptoms Modified Rankin: Moderately severe disability     Balance Overall balance assessment: Needs assistance Sitting-balance support: Feet supported Sitting balance-Leahy Scale: Poor Sitting balance - Comments: Lt leaning and pushing with RUE. Had pt prop on rt elbow and then back to midline with improved balance Postural control: Left lateral lean Standing balance support: Bilateral upper extremity supported, During functional activity Standing balance-Leahy Scale: Poor Standing balance comment: walker and +2 min assist for static standing                            Cognition Arousal/Alertness: Awake/alert Behavior During Therapy: WFL for tasks assessed/performed Overall Cognitive Status: Difficult to assess Area of Impairment: Attention, Following commands, Safety/judgement, Awareness, Problem solving                   Current Attention Level: Selective   Following Commands: Follows one step commands consistently Safety/Judgement: Decreased awareness of deficits, Decreased awareness of safety Awareness: Emergent Problem Solving: Slow processing  Exercises      General Comments General comments (skin integrity, edema, etc.): VSS on trach collar      Pertinent Vitals/Pain Pain Assessment Pain Assessment: Faces Faces Pain Scale: Hurts a little bit Pain Location: abdomen Pain Descriptors / Indicators: Grimacing,  Guarding Pain Intervention(s): Monitored during session, Repositioned, Premedicated before session    Home Living                          Prior Function            PT Goals (current goals can now be found in the care plan section) Acute Rehab PT Goals Patient Stated Goal: to get better Progress towards PT goals: Progressing toward goals    Frequency    Min 4X/week      PT Plan Current plan remains appropriate    Co-evaluation              AM-PAC PT "6 Clicks" Mobility   Outcome Measure  Help needed turning from your back to your side while in a flat bed without using bedrails?: A Lot Help needed moving from lying on your back to sitting on the side of a flat bed without using bedrails?: A Lot Help needed moving to and from a bed to a chair (including a wheelchair)?: Total Help needed standing up from a chair using your arms (e.g., wheelchair or bedside chair)?: Total Help needed to walk in hospital room?: Total Help needed climbing 3-5 steps with a railing? : Total 6 Click Score: 8    End of Session Equipment Utilized During Treatment: Gait belt Activity Tolerance: Patient tolerated treatment well Patient left: with call bell/phone within reach;in chair;with chair alarm set;with family/visitor present Nurse Communication: Mobility status;Need for lift equipment (could use [employer]) PT Visit Diagnosis: Other symptoms and signs involving the nervous system (R29.898);Dizziness and giddiness (R42);Hemiplegia and hemiparesis;Other abnormalities of gait and mobility (R26.89) Hemiplegia - Right/Left: Right Hemiplegia - dominant/non-dominant: Dominant Hemiplegia - caused by: Cerebral infarction     Time: 1500-1526 PT Time Calculation (min) (ACUTE ONLY): 26 min  Charges:  $Therapeutic Activity: 23-37 mins                    [place] 04/04/2022, 5:00 PM

## 2022-04-05 DIAGNOSIS — I63212 Cerebral infarction due to unspecified occlusion or stenosis of left vertebral arteries: Secondary | ICD-10-CM | POA: Diagnosis not present

## 2022-04-05 DIAGNOSIS — J9601 Acute respiratory failure with hypoxia: Secondary | ICD-10-CM | POA: Diagnosis not present

## 2022-04-05 DIAGNOSIS — I469 Cardiac arrest, cause unspecified: Secondary | ICD-10-CM | POA: Diagnosis not present

## 2022-04-05 DIAGNOSIS — R55 Syncope and collapse: Secondary | ICD-10-CM | POA: Diagnosis not present

## 2022-04-05 DIAGNOSIS — E1159 Type 2 diabetes mellitus with other circulatory complications: Secondary | ICD-10-CM | POA: Diagnosis not present

## 2022-04-05 DIAGNOSIS — J690 Pneumonitis due to inhalation of food and vomit: Secondary | ICD-10-CM | POA: Diagnosis not present

## 2022-04-05 DIAGNOSIS — I455 Other specified heart block: Secondary | ICD-10-CM

## 2022-04-05 DIAGNOSIS — I639 Cerebral infarction, unspecified: Secondary | ICD-10-CM | POA: Diagnosis not present

## 2022-04-05 LAB — GLUCOSE, CAPILLARY
Glucose-Capillary: 130 mg/dL — ABNORMAL HIGH (ref 70–99)
Glucose-Capillary: 135 mg/dL — ABNORMAL HIGH (ref 70–99)
Glucose-Capillary: 184 mg/dL — ABNORMAL HIGH (ref 70–99)
Glucose-Capillary: 271 mg/dL — ABNORMAL HIGH (ref 70–99)
Glucose-Capillary: 183 mg/dL — ABNORMAL HIGH (ref 70–99)
Glucose-Capillary: 149 mg/dL — ABNORMAL HIGH (ref 70–99)

## 2022-04-05 LAB — BASIC METABOLIC PANEL
Anion gap: 9 (ref 5–15)
BUN: 31 mg/dL — ABNORMAL HIGH (ref 8–23)
CO2: 25 mmol/L (ref 22–32)
Calcium: 9.1 mg/dL (ref 8.9–10.3)
Chloride: 106 mmol/L (ref 98–111)
Creatinine, Ser: 1.41 mg/dL — ABNORMAL HIGH (ref 0.44–1.00)
GFR, Estimated: 40 mL/min — ABNORMAL LOW (ref >60)
Glucose, Bld: 143 mg/dL — ABNORMAL HIGH (ref 70–99)
Potassium: 4 mmol/L (ref 3.5–5.1)
Sodium: 140 mmol/L (ref 135–145)

## 2022-04-05 LAB — CBC
HCT: 30.3 % — ABNORMAL LOW (ref 36.0–46.0)
Hemoglobin: 9.5 g/dL — ABNORMAL LOW (ref 12.0–15.0)
MCH: 28.2 pg (ref 26.0–34.0)
MCHC: 31.4 g/dL (ref 30.0–36.0)
MCV: 89.9 fL (ref 80.0–100.0)
Platelets: 338 10*3/uL (ref 150–400)
RBC: 3.37 MIL/uL — ABNORMAL LOW (ref 3.87–5.11)
RDW: 13.8 % (ref 11.5–15.5)
WBC: 10 10*3/uL (ref 4.0–10.5)
nRBC: 0 % (ref 0.0–0.2)

## 2022-04-05 LAB — MAGNESIUM: Magnesium: 2.6 mg/dL — ABNORMAL HIGH (ref 1.7–2.4)

## 2022-04-05 MED ORDER — ENOXAPARIN SODIUM 40 MG/0.4ML IJ SOSY
40.0000 mg | PREFILLED_SYRINGE | INTRAMUSCULAR | Status: DC
  Administered 2022-04-07 – 2022-04-18 (×12): 40 mg via SUBCUTANEOUS
  Filled 2022-04-05 (×12): qty 0.4

## 2022-04-05 MED ORDER — DOPAMINE-DEXTROSE 3.2-5 MG/ML-% IV SOLN
0.0000 ug/kg/min | INTRAVENOUS | Status: DC
  Administered 2022-04-05: 2 ug/kg/min via INTRAVENOUS

## 2022-04-05 MED ORDER — "THROMBI-PAD 3""X3"" EX PADS"
1.0000 | MEDICATED_PAD | Freq: Once | CUTANEOUS | Status: AC
  Administered 2022-04-05: 1 via TOPICAL
  Filled 2022-04-05: qty 1

## 2022-04-05 MED ORDER — ATROPINE SULFATE 1 MG/ML IV SOLN
INTRAVENOUS | Status: AC
  Filled 2022-04-05: qty 1

## 2022-04-05 MED ORDER — INSULIN ASPART 100 UNIT/ML IJ SOLN
0.0000 [IU] | INTRAMUSCULAR | Status: DC
  Administered 2022-04-05: 5 [IU] via SUBCUTANEOUS
  Administered 2022-04-05 (×2): 2 [IU] via SUBCUTANEOUS
  Administered 2022-04-05: 1 [IU] via SUBCUTANEOUS
  Administered 2022-04-06: 3 [IU] via SUBCUTANEOUS
  Administered 2022-04-06 (×5): 1 [IU] via SUBCUTANEOUS
  Administered 2022-04-07: 9 [IU] via SUBCUTANEOUS
  Administered 2022-04-07 (×3): 3 [IU] via SUBCUTANEOUS
  Administered 2022-04-07 (×2): 5 [IU] via SUBCUTANEOUS
  Administered 2022-04-08 (×2): 7 [IU] via SUBCUTANEOUS
  Administered 2022-04-08: 9 [IU] via SUBCUTANEOUS
  Administered 2022-04-08: 5 [IU] via SUBCUTANEOUS
  Administered 2022-04-08: 3 [IU] via SUBCUTANEOUS
  Administered 2022-04-09: 5 [IU] via SUBCUTANEOUS
  Administered 2022-04-09: 2 [IU] via SUBCUTANEOUS
  Administered 2022-04-09: 3 [IU] via SUBCUTANEOUS
  Administered 2022-04-09: 5 [IU] via SUBCUTANEOUS
  Administered 2022-04-09: 3 [IU] via SUBCUTANEOUS
  Administered 2022-04-09: 5 [IU] via SUBCUTANEOUS
  Administered 2022-04-10 (×3): 3 [IU] via SUBCUTANEOUS
  Administered 2022-04-10: 2 [IU] via SUBCUTANEOUS
  Administered 2022-04-10: 5 [IU] via SUBCUTANEOUS
  Administered 2022-04-11: 3 [IU] via SUBCUTANEOUS
  Administered 2022-04-11: 1 [IU] via SUBCUTANEOUS
  Administered 2022-04-12: 3 [IU] via SUBCUTANEOUS
  Administered 2022-04-12 – 2022-04-13 (×2): 1 [IU] via SUBCUTANEOUS
  Administered 2022-04-13: 2 [IU] via SUBCUTANEOUS

## 2022-04-05 MED ORDER — GLYCOPYRROLATE 0.2 MG/ML IJ SOLN
INTRAMUSCULAR | Status: AC
  Filled 2022-04-05: qty 1

## 2022-04-05 NOTE — Progress Notes (Signed)
STROKE TEAM PROGRESS NOTE   INTERVAL HISTORY Patient is seen in her room with no family at bedside.  She sitting comfortably in the bedside chair and breathing well on her trach collar.  She had PEG tube placed yesterday uneventfully.  She has been hemodynamically stable and her neurological exam is unchanged.    Vital signs are stable.  Patient is working with speech therapy and is able to talk with a speaking valve   Vitals:   04/05/22 0800 04/05/22 0900 04/05/22 1000 04/05/22 1005  BP: (!) 195/66 (!) 152/55 (!) 170/72   Pulse: 75 87 89 78  Resp: 17 (!) '23 18 16  '$ Temp: 98.3 F (36.8 C)     TempSrc: Oral     SpO2: 93% 98% 100% 95%  Weight:      Height:       CBC:  Recent Labs  Lab 04/04/22 0402 04/05/22 0129  WBC 8.8 10.0  HGB 9.2* 9.5*  HCT 28.0* 30.3*  MCV 88.3 89.9  PLT 319 270   Basic Metabolic Panel:  Recent Labs  Lab 03/30/22 0344 03/31/22 0940 04/01/22 0052 04/04/22 0402 04/05/22 0129  NA 145 144   < > 136 140  K 3.7 3.7   < > 4.1 4.0  CL 113* 108   < > 104 106  CO2 23 24   < > 25 25  GLUCOSE 210* 154*   < > 169* 143*  BUN 44* 34*   < > 31* 31*  CREATININE 1.54* 1.68*   < > 1.57* 1.41*  CALCIUM 8.9 9.0   < > 9.0 9.1  MG 2.6* 2.4  --   --   --   PHOS 3.2 4.1  --   --   --    < > = values in this interval not displayed.   Lipid Panel:  No results for input(s): CHOL, TRIG, HDL, CHOLHDL, VLDL, LDLCALC in the last 168 hours. HgbA1c:  No results for input(s): HGBA1C in the last 168 hours. Urine Drug Screen: No results for input(s): LABOPIA, COCAINSCRNUR, LABBENZ, AMPHETMU, THCU, LABBARB in the last 168 hours.  Alcohol Level No results for input(s): ETH in the last 168 hours.  IMAGING past 24 hours IR GASTROSTOMY TUBE MOD SED  Result Date: 04/04/2022 INDICATION: Dysphagia, CVA EXAM: FLUOROSCOPIC 24 FRENCH PULL-THROUGH GASTROSTOMY Date:  04/04/2022 04/04/2022 11:38 am Radiologist:  M. Daryll Brod, MD Guidance:  Fluoroscopic MEDICATIONS: 2 g Ancef; Antibiotics  were administered within 1 hour of the procedure. Glucagon 0.5 mg IV ANESTHESIA/SEDATION: Versed 1 mg IV; Fentanyl 50 mcg IV Moderate Sedation Time:  17 minute The patient was continuously monitored during the procedure by the interventional radiology nurse under my direct supervision. CONTRAST:  49m OMNIPAQUE IOHEXOL 300 MG/ML SOLN - administered into the gastric lumen. FLUOROSCOPY: Fluoroscopy Time: 2 minutes 54 seconds (99 mGy). COMPLICATIONS: None immediate. PROCEDURE: Informed consent was obtained from the patient following explanation of the procedure, risks, benefits and alternatives. The patient understands, agrees and consents for the procedure. All questions were addressed. A time out was performed. Maximal barrier sterile technique utilized including caps, mask, sterile gowns, sterile gloves, large sterile drape, hand hygiene, and betadine prep. The left upper quadrant was sterilely prepped and draped. An oral gastric catheter was inserted into the stomach under fluoroscopy. The existing nasogastric feeding tube was removed. Air was injected into the stomach for insufflation and visualization under fluoroscopy. The air distended stomach was confirmed beneath the anterior abdominal wall in the frontal and  lateral projections. Under sterile conditions and local anesthesia, a 79 gauge trocar needle was utilized to access the stomach percutaneously beneath the left subcostal margin. Needle position was confirmed within the stomach under biplane fluoroscopy. Contrast injection confirmed position also. A single T tack was deployed for gastropexy. Over an Amplatz guide wire, a 9-French sheath was inserted into the stomach. A snare device was utilized to capture the oral gastric catheter. The snare device was pulled retrograde from the stomach up the esophagus and out the oropharynx. The 20-French pull-through gastrostomy was connected to the snare device and pulled antegrade through the oropharynx down the  esophagus into the stomach and then through the percutaneous tract external to the patient. The gastrostomy was assembled externally. Contrast injection confirms position in the stomach. Images were obtained for documentation. The patient tolerated procedure well. No immediate complication. IMPRESSION: Fluoroscopic insertion of a 20-French "pull-through" gastrostomy. Electronically Signed   By: Jerilynn Mages.  Shick M.D.   On: 04/04/2022 13:07    PHYSICAL EXAM General:  Alert, well-developed, well-nourished, elderly African-American lady with tracheostomy in no acute distress.  S/p PEG tube Respiratory:  on vent.  NEURO:  Awake alert, on trach collar, able to speak with the speaking valve.  Able to follow commands and nods yes/no to questions.PERRL. Right sided nystagmus, unable to abduct left eye. blink to threat b/l. Left lower motor neuron pattern facial muscle weakness present Motor: non focal. Able to lift all ext off bed. No drift. Dysmetria present in LUE Sensory: intact to LT throughout.   NIH 4 premorbid modified Rankin 0   ASSESSMENT/PLAN [name] is a 71 y.o. 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  MRI reveals a left medullary stroke.  On 5/14, patient was intubated due to respiratory distress.    5/20: Extubated then required reintubation: plan for trach/PEG 5/21: Scheduled for PEG tomorrow. CT abd this am.  5/22 Tracheostomy placed 5/23 PEG tube placed  Stroke:  left medulalry infarct likely secondary large vessel disease of left V4 high grade stenosis vs. Short segmental occlusion MRI  Small acute to early subacute infarct in left aspect of the medulla, chronic white matter microangiopathy MRA head and neck  high grade narrowing of left V4 segment, moderate narrowing of bilateral MCA Repeat MRI 5/13 stable left medullary and  pontine infarct 2D Echo EF 83-66%, grade 1 diastolic dysfunction, no atrial level shunt Loop recorder interrogation no A-fib LDL 142 HgbA1c 9.5 VTE prophylaxis - lovenox On Plavix and pletal prior to admission, now on aspirin 325 mg per tube.  Will hold off on Brillinta given need for trach/PEG.  Therapy recommendations:  CIR Disposition:  pending  Respiratory failure Aspiration  Pt choked on saliva  Likely aspirated, on unasyn  CCM on board Emergent intubated 5/14 Trach placed 5/22  Hx of stroke First one in 2004 and on plavix 01/2011 right CC stroke with left sided weakness, balance issue and slurry speech - residue of walking with hopping on the left side - on plavix and pletal 02/2018 admitted for left-sided weakness, left upper extremity muscle spasm, headache.  MRI showed right CR infarct.  MRA and carotid Doppler negative.  EF 60 to 65%.  LDL 87, A1c 8.9.  Discharged on Plavix and Pletal and Lopid.  Loop recorder was placed after discharge.  Cardiac arrest x 2 5/14 patient choked on saliva -> desaturation-> bradycardia->  asystole-> CPR->ROSC in 2 to 3 minutes 5/19 left decubitus positioning -> asystole -> CPR -> ROSC within 1 minute -> followed with seizure-like activity but more consistent with convulsive syncope  Hypertension Home meds:  amlodipine 10 mg daily, losartan-HCTZ 100-25 mg daily Stable Long-term BP goal normotensive  Hyperlipidemia Home meds:  gemfibrozil 600 mg daily, resumed in hospital LDL 142, goal < 70 High intensity statin not indicated due to previous intolerance Consider lipid clinic referral  Diabetes type II Uncontrolled Home meds:  lantus insulin 26 units BID HgbA1c 9.5, goal < 7.0 Now on insulin CBGs SSI Close PCP follow up for better DM control.  Dysphagia Status post cortrak -> nosebleed -> cortrak removed Has OG tube now On TF PEG placed 5/23  Other Stroke Risk Factors Advanced Age >/= 14  Former cigarette smoker Hx  stroke  AKI on CKD3 Cre 1.38-1.46-1.99-1.72-1.68-1.89-> 1.66-> 1.57 CCM on board Avoid contrast when possible Renally dose medications as clinically ventilatory support.  He is short-lived  Left chest wall pain EKG no ST-T changes Chest x-ray unremarkable Likely related to chest compression on 5/14  B/l LE numbness Bilateral LE venous Doppler negative for DVT Supportive care  Other Active Problems Hypothyroidism, continue home synthroid Constipation  Hospital day # 13  Patient had t PEG tube insertion by interventional radiology done yesterday and procedure was uneventful.  She is breathing comfortably on trach collar and will likely be transferred out of ICU soon.  We will switch her to dual antiplatelet therapy of aspirin 81 mg and Brilinta 90 mg twice daily.  She may also consider possible participation in the [place] study for stroke prevention in patients with intracranial stenosis.  Discussed with Dr. Tacy Learn critical care medicine.This patient is critically ill and at significant risk of neurological worsening, death and care requires constant monitoring of vital signs, hemodynamics,respiratory and cardiac monitoring, extensive review of multiple databases, frequent neurological assessment, discussion with family, other specialists and medical decision making of high complexity.I have made any additions or clarifications directly to the above note.This critical care time does not reflect procedure time, or teaching time or supervisory time of PA/NP/Med Resident etc but could involve care discussion time.  I spent 30 minutes of neurocritical care time  in the care of  this patient.      Antony Contras, MD Medical Director [facility] Pager: [phone] 04/05/2022 11:15 AM   To contact Stroke Continuity provider, please refer to [email or web address] After hours, contact General Neurology

## 2022-04-05 NOTE — Progress Notes (Signed)
RT NOTE:  During RT assessment patient was found with trach collar mask pulled to side and bright red blood clot around trach flange. New dressing applied and deep suction done. Clot not cleaned. No blood noted in secretions. Sutures remain in place. Patient did have blood on fingers. RN at bedside to assist. Patient educated to not touch trach. No distress. RN/RT will monitor for new bleeding.

## 2022-04-05 NOTE — Evaluation (Signed)
[facility] Speaking Valve - Evaluation Patient Details  Name: [name] MRN: [id number] Date of Birth: [birth year]  Today's Date: 04/05/2022 Time: 0910-0940 SLP Time Calculation (min) (ACUTE ONLY): 30 min  Past Medical History:  Past Medical History:  Diagnosis Date   Anemia    Arteriosclerotic cardiovascular disease    CKD (chronic kidney disease) stage 3, GFR 30-59 ml/min (HCC)    Claudication in peripheral vascular disease ([place])    CVA (cerebral vascular accident) ([place])    2012 and 2019-affected left side + dysarthria 2/2 severe R ACA and MCA stenosis   Epistaxis 03/29/2022   GERD (gastroesophageal reflux disease)    Hyperlipidemia    declines statins   Hypertension    Hypothyroidism    Type 2 diabetes mellitus ([place]) 2000   Vitamin D deficiency    Past Surgical History:  Past Surgical History:  Procedure Laterality Date   CESAREAN SECTION     COLONOSCOPY  09/21/2020   IR GASTROSTOMY TUBE MOD SED  04/04/2022   LEFT HEART CATHETERIZATION WITH CORONARY ANGIOGRAM N/A 03/08/2012   Procedure: LEFT HEART CATHETERIZATION WITH CORONARY ANGIOGRAM;  Surgeon: Sinclair Grooms, MD;  Location: [facility] CATH LAB;  Service: Cardiovascular;  Laterality: N/A;   LEFT HEART CATHETERIZATION WITH CORONARY ANGIOGRAM N/A 12/17/2012   Procedure: LEFT HEART CATHETERIZATION WITH CORONARY ANGIOGRAM;  Surgeon: Laverda Page, MD;  Location: [facility] CATH LAB;  Service: Cardiovascular;  Laterality: N/A;   LOOP RECORDER INSERTION N/A 03/05/2018   Procedure: LOOP RECORDER INSERTION;  Surgeon: Evans Lance, MD;  Location: [place] CV LAB;  Service: Cardiovascular;  Laterality: N/A;   LOWER EXTREMITY ANGIOGRAM N/A 12/17/2012   Procedure: LOWER EXTREMITY ANGIOGRAM;  Surgeon: Laverda Page, MD;  Location: [facility] CATH LAB;  Service: Cardiovascular;  Laterality: N/A;   TEE WITHOUT CARDIOVERSION N/A 02/28/2018   Procedure: TRANSESOPHAGEAL ECHOCARDIOGRAM (TEE);  Surgeon: Jerline Pain, MD;  Location: [facility] ENDOSCOPY;   Service: Cardiovascular;  Laterality: N/A;   TUBAL LIGATION     HPI:  Pt is a 71 y.o. female who presenting with weakness and lightheadedness. MRI 5/11: Small acute to early subacute infarct in the left aspect of the  medulla. Pt found tohave severe neuromuscular dysphagia after MBS. Pt then intubated 5/14-5/22 and trached.  PMH: CVA, DM2, HTN, CKD3b, HLD.    Assessment / Plan / Recommendation  Clinical Impression  Pt demonstrates good tolerance of PMSV with adequate airflow to upper airway and oral expectoration of thin bloody secretions as needed over a 30 minute interval. Pt able to achieve phrase length voicing though dysponia moderate and breath support limited. Suspect a cuffless/small trach would improve vocal quality. Pt needs cues to initiate voicing, but was becoming more automatic by the end of session. Pt oriented to place, month/year, self and situation. Pt has had significant bleeding at trach site recently. Recommend pt wear PMSV with full supervision, but as long as needed while supervised by staff or visitors. Would not want pt alone with PMSV yet. Will f/u for advancement. SLP Visit Diagnosis: Aphonia (R49.1)    SLP Assessment  Patient needs continued Speech [facility] [facility]    Recommendations for follow up therapy are one component of a multi-disciplinary discharge planning process, led by the attending physician.  Recommendations may be updated based on patient status, additional functional criteria and insurance authorization.  Follow Up Recommendations  Acute inpatient rehab (3hours/day)    Assistance Recommended at Discharge Frequent or constant Supervision/Assistance  Functional Status Assessment Patient has  had a recent decline in their functional status and demonstrates the ability to make significant improvements in function in a reasonable and predictable amount of time.  Frequency and Duration min 2x/week  2 weeks    PMSV Trial PMSV was placed for:  25 minutes Able to redirect subglottic air through upper airway: Yes Able to Attain Phonation: Yes Voice Quality: Breathy;Low vocal intensity Able to Expectorate Secretions: Yes Level of Secretion Expectoration with PMSV: Oral Breath Support for Phonation: Moderately decreased Intelligibility: Intelligibility reduced Word: 50-74% accurate Phrase: 50-74% accurate Sentence: 50-74% accurate Conversation: 50-74% accurate   Tracheostomy Tube  Additional Tracheostomy Tube Assessment Trach Collar Period: all waking hours Secretion Description: bloody thin    Vent Dependency  FiO2 (%): 28 %    Cuff Deflation Trial Tolerated Cuff Deflation: Yes Length of Time for Cuff Deflation Trial: 30 minutes Behavior: Alert         Joreen Swearingin, Katherene Ponto 04/05/2022, 11:19 AM

## 2022-04-05 NOTE — Consult Note (Addendum)
Cardiology Consultation:   Patient ID: [name] MRN: [id number]; DOB: May 29, 1951  Admit date: 03/23/2022 Date of Consult: 04/05/2022  PCP:  [name] Han, MD (Inactive)   [facility] Providers Cardiologist:  Kala Gassmann Meredith Leeds, MD   Patient Profile:   [name] is a 71 y.o. female with a hx of prior CVA 2012 and 2019 with left side weakness and dysarthria 2/2 severe R ACA and MCA stenosis, DM 2, hypertension, CKD 3B, CAD, PAD, and hyperlipidemia who is being seen 04/05/2022 for the evaluation of bradycardia at the request of Dr. Tacy Learn.  History of Present Illness:   [name] has a history of CAD with greater than 80% stenosis in the first diagonal branch on heart catheterization in 2013 by Dr. Tamala Julian.  No PCI at that time as the distribution from this branch was small.  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 Einar Gip 2014 that showed high-grade stenosis of 70 to 80% in the right internal iliac artery and 60 to 70% in the left external iliac artery.  She also had bilateral SFA disease and was recommended for aortobifem bypass surgery.  Pt states this was not completed.  She has a history of CVA in 2012 and 2019.  She was seen by Dr. Lovena Le following imaging in 2019 showing subacute right infarcts and underwent implantable loop recorder.  Echocardiogram that admission showed an LVEF 65% no regional wall motion abnormality grade 1 diastolic dysfunction.  TEE was largely unrevealing for etiology of stroke.  No A-fib was detected on ILR.   She did not have outpatient cardiology follow-up.  She presented to ED 03/23/2022 for weakness and lightheadedness that started 5 days prior.  She was admitted for near syncope.  MRI brain with small acute to early subacute infarct in the left aspect of the medulla with white matter microangiopathy and small remote lacunar infarcts in the basal  ganglia and right caudate head.  Neurology was consulted.  She also reported choking on food.  Core track was attempted but then removed for bleeding.  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  On 03/25/2022 she continued to choke, PCCM consulted.  Echocardiogram 03/24/2022 revealed LVEF 60 to 32%, grade 1 diastolic dysfunction, normal RV, no significant valvular disease.  ON 03/26/2022 at approximately 12:45 PM CODE BLUE was called for PEA arrest following a violent coughing episode, patient stated she felt like she was choking, she desaturated with O2 readings in the 20% range, she became asystolic.  CPR was initiated and ROSC was achieved after 2 minutes of CPR with ACLS protocol.  She received 1 dose of epinephrine.  Post ROSC patient was intubated and placed on mechanical ventilation.  Telemetry reviewed and shows normal sinus rhythm just prior to arrest.  Arrest felt secondary to aspiration versus hypoxia.  She remained intubated and failed SBT.  On 03/31/22 she arrested again with left decubitus postioning for enema - ROSC after 1 min of CPR followed by seizure like activity consistent with convulsive syncope.  She was extubated on 04/01/2022 but reintubated for stridor postextubation.   Bronchoscopy on 04/03/2022 performed for ETT placement and tracheostomy.  She underwent PEG placement 04/04/2022.  She completed Unasyn.  She was started on aspirin and Brilinta.  She is now receiving tube feeds and AKI has improved.  On 04/05/2022, telemetry showed intermittent bradycardia into the 20s and 30s.  1 pause of 3.44 seconds, several other pauses  less than 3 seconds.  Cardiology was consulted.  PTA: Amlodipine 10 mg, losartan-HCTZ 100-25 mg daily, Plavix, Pletal, and Lopid  PRN labetalol had been ordered but not given this admission, now discontinued.   During my interview, the patient denies claudication, chest pain, and palpitations. She reports continued dizziness that is not new and similar to her  presenting symptoms on admission. Her main compliant is trouble clearing her throat, she has some bleeding around her trach.   Past Medical History:  Diagnosis Date   Anemia    Arteriosclerotic cardiovascular disease    CKD (chronic kidney disease) stage 3, GFR 30-59 ml/min (HCC)    Claudication in peripheral vascular disease (HCC)    CVA (cerebral vascular accident) ([place])    2012 and 2019-affected left side + dysarthria 2/2 severe R ACA and MCA stenosis   Epistaxis 03/29/2022   GERD (gastroesophageal reflux disease)    Hyperlipidemia    declines statins   Hypertension    Hypothyroidism    Type 2 diabetes mellitus ([place]) 2000   Vitamin D deficiency     Past Surgical History:  Procedure Laterality Date   CESAREAN SECTION     COLONOSCOPY  09/21/2020   IR GASTROSTOMY TUBE MOD SED  04/04/2022   LEFT HEART CATHETERIZATION WITH CORONARY ANGIOGRAM N/A 03/08/2012   Procedure: LEFT HEART CATHETERIZATION WITH CORONARY ANGIOGRAM;  Surgeon: Sinclair Grooms, MD;  Location: [facility] C[facility] LAB;  Service: Cardiovascular;  Laterality: N/A;   LEFT HEART CATHETERIZATION WITH CORONARY ANGIOGRAM N/A 12/17/2012   Procedure: LEFT HEART CATHETERIZATION WITH CORONARY ANGIOGRAM;  Surgeon: Laverda Page, MD;  Location: [facility] CATH LAB;  Service: Cardiovascular;  Laterality: N/A;   LOOP RECORDER INSERTION N/A 03/05/2018   Procedure: LOOP RECORDER INSERTION;  Surgeon: Evans Lance, MD;  Location: [facility] CV LAB;  Service: Cardiovascular;  Laterality: N/A;   LOWER EXTREMITY ANGIOGRAM N/A 12/17/2012   Procedure: LOWER EXTREMITY ANGIOGRAM;  Surgeon: Laverda Page, MD;  Location: [facility] CATH LAB;  Service: Cardiovascular;  Laterality: N/A;   TEE WITHOUT CARDIOVERSION N/A 02/28/2018   Procedure: TRANSESOPHAGEAL ECHOCARDIOGRAM (TEE);  Surgeon: Jerline Pain, MD;  Location: [facility] ENDOSCOPY;  Service: Cardiovascular;  Laterality: N/A;   TUBAL LIGATION       Home Medications:  Prior to Admission medications   Medication  Sig Start Date End Date Taking? Authorizing Provider  amLODipine (NORVASC) 10 MG tablet Take 10 mg by mouth daily. 12/31/21  Yes [provider]  cilostazol (PLETAL) 100 MG tablet TAKE 1 TABLET BY MOUTH TWICE A DAY Patient taking differently: Take 100 mg by mouth 2 (two) times daily. 01/28/19  Yes [name], MD  clopidogrel (PLAVIX) 75 MG tablet TAKE 1 TABLET BY MOUTH EVERY DAY IN THE MORNING Patient taking differently: Take 75 mg by mouth daily. 05/18/18  Yes [name], MD  gemfibrozil (LOPID) 600 MG tablet TAKE 1 TABLET BY MOUTH TWICE A DAY Patient taking differently: Take 600 mg by mouth daily. 09/02/18  Yes [name], MD  LANTUS SOLOSTAR 100 UNIT/ML Solostar Pen 26 Units 2 (two) times daily. 02/24/20  Yes [provider]  levothyroxine (SYNTHROID, LEVOTHROID) 88 MCG tablet TAKE 1 TABLET BY MOUTH EVERY DAY BEFORE BREAKFAST Patient taking differently: Take 88 mcg by mouth daily before breakfast. 09/05/18  Yes Svalina, Estill Dooms, MD  losartan-hydrochlorothiazide (HYZAAR) 100-25 MG tablet Take 1 tablet by mouth daily. 06/19/19  Yes Axel Filler, MD  BD INSULIN SYRINGE ULTRAFINE 31G X 15/64" 0.5 ML MISC USE TO  INJECT INSULIN TWO TIMES A DAY 11/15/16   [name], MD  glucose blood (ACCU-CHEK AVIVA PLUS) test strip 1 each by Other route 2 (two) times daily. Use as instructed 08/14/19   Axel Filler, MD  Insulin Pen Needle (B-D UF III MINI PEN NEEDLES) 31G X 5 MM MISC INJECTS INSULIN 2 TIMES PER DAY. 05/06/19   Asencion Noble, MD  Lancets (ACCU-CHEK SOFT TOUCH) lancets Use as instructed 05/28/19   Jose Persia, MD    Inpatient Medications: Scheduled Meds:  aspirin  81 mg Per Tube Daily   atropine       chlorhexidine  15 mL Mouth Rinse BID   Chlorhexidine Gluconate Cloth  6 each Topical Daily   diclofenac  1 patch Transdermal BID   docusate  100 mg Per Tube BID   [START ON 04/06/2022] enoxaparin (LOVENOX) injection  40 mg Subcutaneous Q24H    famotidine  20 mg Per Tube Q24H   feeding supplement (PROSource TF)  45 mL Per Tube BID   gemfibrozil  600 mg Per Tube BID AC   insulin aspart  0-9 Units Subcutaneous Q4H   insulin glargine-yfgn  15 Units Subcutaneous Daily   levothyroxine  88 mcg Per Tube Q0600   mouth rinse  15 mL Mouth Rinse 10 times per day   polyethylene glycol  17 g Per Tube BID   potassium chloride  40 mEq Per Tube Daily   rosuvastatin  10 mg Per Tube Daily   senna  2 tablet Per Tube BID   ticagrelor  90 mg Per Tube BID   Continuous Infusions:  feeding supplement (JEVITY 1.5 CAL/FIBER) 1,000 mL (04/05/22 1000)   PRN Meds: acetaminophen **OR** acetaminophen (TYLENOL) oral liquid 160 mg/5 mL **OR** acetaminophen, hydrALAZINE, sodium chloride  Allergies:    Allergies  Allergen Reactions   Dilantin [Phenytoin Sodium Extended]     Rash/itching   Insulins Rash    Humalog 75/25 only   Statins Nausea Only and Rash    "sick to my stomach"; describes "rash and a lump" while taking statins    Social History:   Social History   Socioeconomic History   Marital status: Single    Spouse name: Not on file   Number of children: 1   Years of education: 10th- GED   Highest education level: Not on file  Occupational History   Occupation: Retired  Tobacco Use   Smoking status: Former    Types: Cigarettes    Quit date: 02/04/2011    Years since quitting: 11.1   Smokeless tobacco: Never  Vaping Use   Vaping Use: Never used  Substance and Sexual Activity   Alcohol use: No    Alcohol/week: 0.0 standard drinks   Drug use: No   Sexual activity: Not on file  Other Topics Concern   Not on file  Social History Narrative   Not on file   Social Determinants of Health   Financial Resource Strain: Not on file  Food Insecurity: Not on file  Transportation Needs: Not on file  Physical Activity: Not on file  Stress: Not on file  Social Connections: Not on file  Intimate Partner Violence: Not on file    Family  History:    Family History  Problem Relation Age of Onset   Diabetes Brother    Prostate cancer Brother    Diabetes Mother    Diabetes Father    Diabetes Sister    Esophageal cancer Neg Hx    Rectal  cancer Neg Hx    Stomach cancer Neg Hx    Colon cancer Neg Hx    Breast cancer Neg Hx      ROS:  Please see the history of present illness.   All other ROS reviewed and negative.     Physical Exam/Data:   Vitals:   04/05/22 1300 04/05/22 1400 04/05/22 1418 04/05/22 1500  BP: (!) 163/45 (!) 150/59  (!) 164/68  Pulse: 69 89 89 93  Resp: (!) 24 (!) '22 17 14  '$ Temp:      TempSrc:      SpO2: 94% 99% 99% 93%  Weight:      Height:        Intake/Output Summary (Last 24 hours) at 04/05/2022 1519 Last data filed at 04/05/2022 1500 Gross per 24 hour  Intake 400 ml  Output 600 ml  Net -200 ml      03/30/2022    5:00 AM 03/27/2022    5:00 AM 03/25/2022    1:41 AM  Last 3 Weights  Weight (lbs) 164 lb 14.5 oz 163 lb 9.3 oz 165 lb 2 oz  Weight (kg) 74.8 kg 74.2 kg 74.9 kg     Body mass index is 30.16 kg/m.  General:  pt responds to questions, trach in place HEENT: normal Neck: no JVD Vascular: distal pulses weak in feet bilateral Cardiac:  RRR, heart sounds difficult with trach sounds, gurgling  Lungs:  trach in place, coughing Abd: soft, nontender, no hepatomegaly  Ext: no edema Musculoskeletal:  No deformities, moves extremities  Skin: warm and dry  Psych:  Normal affect   EKG:  The EKG was personally reviewed and demonstrates:  sinus rhythm HR 68 Telemetry:  Telemetry was personally reviewed and demonstrates:  reviewed arrest, sinus to asystole, bradycardia to 30s with longest pause 3.8 sec  Relevant CV Studies:  Echo 03/24/22:  1. Left ventricular ejection fraction, by estimation, is 60 to 65%. The  left ventricle has normal function. The left ventricle has no regional  wall motion abnormalities. There is mild left ventricular hypertrophy.  Left ventricular diastolic  parameters  are consistent with Grade I diastolic dysfunction (impaired relaxation).   2. Right ventricular systolic function is normal. The right ventricular  size is normal.   3. The mitral valve is normal in structure. No evidence of mitral valve  regurgitation.   4. There is mild calcification of the aortic valve. Aortic valve  regurgitation is not visualized.   5. The inferior vena cava is normal in size with greater than 50%  respiratory variability, suggesting right atrial pressure of 3 mmHg.   Laboratory Data:  High Sensitivity Troponin:   Recent Labs  Lab 03/29/22 1501 03/29/22 1706  TROPONINIHS 24* 25*     Chemistry Recent Labs  Lab 03/30/22 0344 03/31/22 0940 04/01/22 0052 04/03/22 0232 04/04/22 0402 04/05/22 0129  NA 145 144   < > 139 136 140  K 3.7 3.7   < > 4.0 4.1 4.0  CL 113* 108   < > 105 104 106  CO2 23 24   < > '26 25 25  '$ GLUCOSE 210* 154*   < > 117* 169* 143*  BUN 44* 34*   < > 36* 31* 31*  CREATININE 1.54* 1.68*   < > 1.66* 1.57* 1.41*  CALCIUM 8.9 9.0   < > 9.1 9.0 9.1  MG 2.6* 2.4  --   --   --  2.6*  GFRNONAA 36* 33*   < >  33* 35* 40*  ANIONGAP 9 12   < > '8 7 9   '$ < > = values in this interval not displayed.    No results for input(s): PROT, ALBUMIN, AST, ALT, ALKPHOS, BILITOT in the last 168 hours. Lipids No results for input(s): CHOL, TRIG, HDL, LABVLDL, LDLCALC, CHOLHDL in the last 168 hours.  Hematology Recent Labs  Lab 04/02/22 0235 04/04/22 0402 04/05/22 0129  WBC 11.8* 8.8 10.0  RBC 2.99* 3.17* 3.37*  HGB 8.7* 9.2* 9.5*  HCT 27.0* 28.0* 30.3*  MCV 90.3 88.3 89.9  MCH 29.1 29.0 28.2  MCHC 32.2 32.9 31.4  RDW 14.1 14.1 13.8  PLT 279 319 338   Thyroid No results for input(s): TSH, FREET4 in the last 168 hours.  BNP Recent Labs  Lab 04/01/22 0052  BNP 81.4    DDimer No results for input(s): DDIMER in the last 168 hours.   Radiology/Studies:  CT ABDOMEN WO CONTRAST  Result Date: 04/03/2022 CLINICAL DATA:  Evaluate  anatomy for potential percutaneous gastrostomy tube placement. EXAM: CT ABDOMEN WITHOUT CONTRAST TECHNIQUE: Multidetector CT imaging of the abdomen was performed following the standard protocol without IV contrast. RADIATION DOSE REDUCTION: This exam was performed according to the departmental dose-optimization program which includes automated exposure control, adjustment of the mA and/or kV according to patient size and/or use of iterative reconstruction technique. COMPARISON:  None Available. FINDINGS: The lack of intravenous contrast limits the ability to evaluate solid abdominal organs. Lower chest: Limited visualization of the lower thorax demonstrates bibasilar consolidative opacities and air bronchograms, left slightly greater than right. Borderline cardiomegaly. Coronary artery calcifications. Calcification involving the aortic root. Trace amount of pericardial fluid, presumably physiologic. Hepatobiliary: Normal hepatic contour. Suspected punctate (approximately 0.3 cm) stone lying neck of the gallbladder (image 26, series 3). Suspected calcifications involving the gallbladder wall are also seen on image 26, series 3 no definitive gallbladder wall thickening or pericholecystic stranding. No ascites. Pancreas: Normal noncontrast appearance of the pancreas. Spleen: Normal noncontrast appearance of the spleen. Note is made of a punctate splenule. Adrenals/Urinary Tract: The left kidney is slightly atrophic in relation to the right, similar to the 2016 examination. Calcifications about the bilateral renal hila are favored to be vascular in etiology. No definite evidence of nephrolithiasis. No renal stones are seen along expected course of either ureter. No urinary obstruction or perinephric stranding. Normal noncontrast appearance the bilateral adrenal glands. The urinary bladder was not imaged. Stomach/Bowel: The anterior wall the stomach is well apposed against the ventral abdominal wall without interposition  of either the liver or transverse colon. Enteric contrast is seen throughout the colon. No evidence of enteric obstruction. Normal noncontrast appearance of the terminal ileum and retrocecal appendix. No hiatal hernia. Enteric tube tip terminates within the gastric antrum. Vascular/Lymphatic: Atherosclerotic plaque within normal caliber abdominal aorta. Potential hemodynamically significant narrowings involving the bilateral common iliac arteries, left greater than right (image 58 and 59, series 3). Other: Tiny mesenteric fat containing periumbilical hernia. Mild diffuse body wall anasarca. Musculoskeletal: No acute or aggressive osseous abnormalities. Bilateral facet degenerative change the lower lumbar spine. Stigmata of dish within the thoracic spine. IMPRESSION: 1. Gastric anatomy amenable to potential percutaneous gastrostomy tube placement as indicated. 2. Bibasilar consolidative opacities with associated bronchograms, left greater right, atelectasis versus infiltrate. 3. Cholelithiasis with suspected calcifications involving the gallbladder wall without evidence of acute cholecystitis on this noncontrast examination. Further evaluation with right upper quadrant abdominal ultrasound could be performed as indicated. 4. Coronary artery calcifications. Aortic  Atherosclerosis (ICD10-I70.0). 5. Potential hemodynamically significant narrowings involving the bilateral common iliac arteries, left greater than right. Correlation for symptoms of PAD is advised. Further evaluation with acquisition of nonemergent ABIs could be performed as indicated. Electronically Signed   By: Sandi Mariscal M.D.   On: 04/03/2022 09:49   IR GASTROSTOMY TUBE MOD SED  Result Date: 04/04/2022 INDICATION: Dysphagia, CVA EXAM: FLUOROSCOPIC 24 FRENCH PULL-THROUGH GASTROSTOMY Date:  04/04/2022 04/04/2022 11:38 am Radiologist:  M. Daryll Brod, MD Guidance:  Fluoroscopic MEDICATIONS: 2 g Ancef; Antibiotics were administered within 1 hour of the  procedure. Glucagon 0.5 mg IV ANESTHESIA/SEDATION: Versed 1 mg IV; Fentanyl 50 mcg IV Moderate Sedation Time:  17 minute The patient was continuously monitored during the procedure by the interventional radiology nurse under my direct supervision. CONTRAST:  98m OMNIPAQUE IOHEXOL 300 MG/ML SOLN - administered into the gastric lumen. FLUOROSCOPY: Fluoroscopy Time: 2 minutes 54 seconds (99 mGy). COMPLICATIONS: None immediate. PROCEDURE: Informed consent was obtained from the patient following explanation of the procedure, risks, benefits and alternatives. The patient understands, agrees and consents for the procedure. All questions were addressed. A time out was performed. Maximal barrier sterile technique utilized including caps, mask, sterile gowns, sterile gloves, large sterile drape, hand hygiene, and betadine prep. The left upper quadrant was sterilely prepped and draped. An oral gastric catheter was inserted into the stomach under fluoroscopy. The existing nasogastric feeding tube was removed. Air was injected into the stomach for insufflation and visualization under fluoroscopy. The air distended stomach was confirmed beneath the anterior abdominal wall in the frontal and lateral projections. Under sterile conditions and local anesthesia, a 182gauge trocar needle was utilized to access the stomach percutaneously beneath the left subcostal margin. Needle position was confirmed within the stomach under biplane fluoroscopy. Contrast injection confirmed position also. A single T tack was deployed for gastropexy. Over an Amplatz guide wire, a 9-French sheath was inserted into the stomach. A snare device was utilized to capture the oral gastric catheter. The snare device was pulled retrograde from the stomach up the esophagus and out the oropharynx. The 20-French pull-through gastrostomy was connected to the snare device and pulled antegrade through the oropharynx down the esophagus into the stomach and then through  the percutaneous tract external to the patient. The gastrostomy was assembled externally. Contrast injection confirms position in the stomach. Images were obtained for documentation. The patient tolerated procedure well. No immediate complication. IMPRESSION: Fluoroscopic insertion of a 20-French "pull-through" gastrostomy. Electronically Signed   By: MJerilynn Mages  Shick M.D.   On: 04/04/2022 13:07   DG Chest Port 1 View  Result Date: 04/03/2022 CLINICAL DATA:  Status post tracheostomy. EXAM: PORTABLE CHEST 1 VIEW COMPARISON:  Apr 01, 2022. FINDINGS: Tracheostomy tube is in grossly good position. Stable cardiomediastinal silhouette. Mild bibasilar subsegmental atelectasis is noted. IMPRESSION: Tracheostomy tube in grossly good position. Electronically Signed   By: JMarijo ConceptionM.D.   On: 04/03/2022 15:21     Assessment and Plan:   Bradycardia, suspected vagal etiology She has had two arrests (5/14/, 5/19) both felt due to a vagal response. On 5/14 she arrested after a severe coughing episodes with O2 saturations dropping to the 20s. On 5/19 she was asystole with turning for an enema.  Telemetry today with new intermittent bradycardia from HR 90s to 30s with pauses up to 3.8 seconds. It appears she is having pauses and bradycardia outside of coughing / vagal episodes. Appears to be sinus bradycardia.   Niylah Hassan continue to monitor  on telemetry. No AV nodal agents given. Mg 2.6, K 4.0. Jontae Sonier add on TSH tomorrow for completeness. Carlee Vonderhaar try to avoid a device in the setting of trach and PEG. Can use dopamine if needed overnight to support heart rate. Please repeat EKG today. Echo performed this admission.  Hold tube feeds at MN tonight please.   CAD PAD No chest pain, no claudication prior to presentation.  Antiplatelets per neurology.   Left medullary CVA Dysphagia Uncontrolled DM with hyperglycemia - A1c 9.5% Respiratory failure with aspiration pneumonia likely due to dysphagia poststroke Per  primary    Risk Assessment/Risk Scores:      For questions or updates, please contact [facility] Please consult [email or web address] for contact info under    Signed, Ledora Bottcher, [place]  04/05/2022 3:19 PM  I have seen and examined this patient with [place].  Agree with above, note added to reflect my findings.  Patient tender to the hospital initially with cryptogenic stroke.  She has a history of cryptogenic stroke.  She had a coughing episode and had a cardiac arrest.  She was intubated postarrest.  Arrest was thought due to aspiration with hypoxia.  She arrested again after being put in a left decubitus position and had 1 minute of CPR.  This was followed by seizure-like activity consistent with convulsive syncope.  She was extubated, but reintubated for stridor.  She had a tracheostomy placed and now has a PEG tube placed 04/04/2022.  The patient has been having episodes of bradycardia.  She has had pauses of 3.4 seconds at the longest.  She has bradycardia episodes down into the 20s to 30s beats per minute.  The patient is laying in bed on my exam.  She is minimally symptomatic.  She does state that she gets dizzy at times, but it is difficult to correlate these dizzy episodes to her but so the bradycardia.  GEN: Well nourished, well developed, in no acute distress  HEENT: normal  Neck: no JVD, carotid bruits, or masses Cardiac: RRR; no murmurs, rubs, or gallops,no edema  Respiratory:  clear to auscultation bilaterally, normal work of breathing GI: soft, nontender, nondistended, + BS MS: no deformity or atrophy  Skin: warm and dry Neuro:  Strength and sensation are intact Psych: euthymic mood, full affect   Sinus bradycardia with sinus pauses: Patient has multiple reasons to have vagal episodes.  Each episode does appear to have sinus slowing with evidence of sinus pauses.  It is unclear to me as to the instigator of these episodes, as the patient has been laying in bed.   Fortunately, as the patient has not been out of bed, she is unlikely to cause any injury.  At this point, due to her tracheostomy and PEG tube, she would be at a very high risk of infection for pacemaker implant.  Due to that, would hold off for now.  If she does have prolonged pauses which are repetitive and symptomatic, would plan to start the patient on dopamine.  As long as the pauses are asymptomatic and short, we Shawnn Bouillon continue with current management.  We Smera Guyette continue to monitor the patient's rhythm.  Sahalie Beth M. Carah Barrientes MD 04/05/2022 4:19 PM

## 2022-04-05 NOTE — Progress Notes (Signed)
Physical Therapy Treatment Patient Details Name: [name] MRN: [id number] DOB: [birth year] Today's Date: 04/05/2022   History of Present Illness Pt is a 71 year old woman admitted on 03/23/22 with dizziness, decreased balance and hoarse voice. + stroke L medulla. Patient coded and required CPR and intubation on 5/14. Trach placed 5/22. PEG placed 5/23.  PMH: CVA, DM@, CKDIII, PVD, HTN, hypothyroidism.    PT Comments    Pt with slow, steady progress. Cognitive and balance deficits resulting in decr mobility. Hope to try ambulation with walker next visit. Continue to recommend acute inpatient rehab at DC.    Recommendations for follow up therapy are one component of a multi-disciplinary discharge planning process, led by the attending physician.  Recommendations may be updated based on patient status, additional functional criteria and insurance authorization.  Follow Up Recommendations  Acute inpatient rehab (3hours/day)     Assistance Recommended at Discharge Frequent or constant Supervision/Assistance  Patient can return home with the following Two people to help with walking and/or transfers;A lot of help with bathing/dressing/bathroom;Assist for transportation;Help with stairs or ramp for entrance   Equipment Recommendations  Rolling walker (2 wheels)    Recommendations for Other Services Rehab consult     Precautions / Restrictions Precautions Precautions: Fall Precaution Comments: trach, PEG Restrictions Weight Bearing Restrictions: No     Mobility  Bed Mobility Overal bed mobility: Needs Assistance Bed Mobility: Supine to Sit     Supine to sit: Mod assist, +2 for safety/equipment     General bed mobility comments: Assist to elevate trunk into sitting and bring hips to EOB.    Transfers Overall transfer level: Needs assistance Equipment used: Rolling walker (2 wheels) Transfers: Sit to/from Stand, Bed to chair/wheelchair/BSC Sit to Stand: Min assist, +2  safety/equipment   Step pivot transfers: Mod assist, +2 safety/equipment       General transfer comment: Assist to bring hips up and for balance. Pt with left lateral lean. Assist for balance and support with pivotal steps bed to chair with walker.    Ambulation/Gait                   Stairs             Wheelchair Mobility    Modified Rankin (Stroke Patients Only) Modified Rankin (Stroke Patients Only) Pre-Morbid Rankin Score: No symptoms Modified Rankin: Moderately severe disability     Balance Overall balance assessment: Needs assistance Sitting-balance support: Feet supported Sitting balance-Leahy Scale: Poor Sitting balance - Comments: Lt leaning and pushing with RUE. Postural control: Left lateral lean Standing balance support: Bilateral upper extremity supported, During functional activity Standing balance-Leahy Scale: Poor Standing balance comment: walker and min assist for static standing                            Cognition Arousal/Alertness: Awake/alert Behavior During Therapy: Anxious Overall Cognitive Status: Difficult to assess Area of Impairment: Attention, Following commands, Safety/judgement, Awareness, Problem solving                   Current Attention Level: Sustained   Following Commands: Follows one step commands consistently Safety/Judgement: Decreased awareness of deficits, Decreased awareness of safety Awareness: Emergent Problem Solving: Slow processing, Requires verbal cues, Requires tactile cues          Exercises      General Comments General comments (skin integrity, edema, etc.): VSS on trach collar  Pertinent Vitals/Pain Pain Assessment Pain Assessment: CPOT Facial Expression: Relaxed, neutral Body Movements: Absence of movements Muscle Tension: Tense, rigid Compliance with ventilator (intubated pts.): N/A Vocalization (extubated pts.): Talking in normal tone or no sound CPOT Total: 1     Home Living                          Prior Function            PT Goals (current goals can now be found in the care plan section) Acute Rehab PT Goals Patient Stated Goal: to get better Progress towards PT goals: Progressing toward goals    Frequency    Min 4X/week      PT Plan Current plan remains appropriate    Co-evaluation PT/OT/SLP Co-Evaluation/Treatment: Yes Reason for Co-Treatment: Necessary to address cognition/behavior during functional activity PT goals addressed during session: Mobility/safety with mobility;Balance        AM-PAC PT "6 Clicks" Mobility   Outcome Measure  Help needed turning from your back to your side while in a flat bed without using bedrails?: A Lot Help needed moving from lying on your back to sitting on the side of a flat bed without using bedrails?: A Lot Help needed moving to and from a bed to a chair (including a wheelchair)?: Total Help needed standing up from a chair using your arms (e.g., wheelchair or bedside chair)?: Total Help needed to walk in hospital room?: Total Help needed climbing 3-5 steps with a railing? : Total 6 Click Score: 8    End of Session Equipment Utilized During Treatment: Oxygen Activity Tolerance: Patient tolerated treatment well Patient left: with call bell/phone within reach;in chair;Other (comment) (SLP present) Nurse Communication: Mobility status PT Visit Diagnosis: Other symptoms and signs involving the nervous system (R29.898);Dizziness and giddiness (R42);Hemiplegia and hemiparesis;Other abnormalities of gait and mobility (R26.89) Hemiplegia - Right/Left: Right Hemiplegia - dominant/non-dominant: Dominant Hemiplegia - caused by: Cerebral infarction     Time: 0900-0920 PT Time Calculation (min) (ACUTE ONLY): 20 min  Charges:  $Therapeutic Activity: 8-22 mins                     [place] 04/05/2022,  12:40 PM

## 2022-04-05 NOTE — Progress Notes (Signed)
NAME:  [name], MRN:  [id number], DOB:  04/13/1951, LOS: 41 ADMISSION DATE:  03/23/2022, CONSULTATION DATE: 03/25/2022 REFERRING MD:  Jonetta Osgood, MD , CHIEF COMPLAINT: Increasing shortness of breath  History of Present Illness:  71 year old female with poorly controlled diabetes, hypertension, CKD stage IIIb and prior history of CVA who presented with vertigo and progressive dysphagia, she was noted to have left medullary acute stroke, was admitted to hospital service for further management, stroke team is following.  This afternoon patient started with increasing dysphagia and unable to protect her airway, she required frequent suctioning, she became hypoxic and her oxygen requirement went up, now she is on Ventimask, PCCM was consulted for help evaluation and management  Pertinent  Medical History   Past Medical History:  Diagnosis Date   Anemia    Arteriosclerotic cardiovascular disease    CKD (chronic kidney disease) stage 3, GFR 30-59 ml/min (HCC)    Claudication in peripheral vascular disease ([place])    CVA (cerebral vascular accident) ([place])    2012 and 2019-affected left side + dysarthria 2/2 severe R ACA and MCA stenosis   Epistaxis 03/29/2022   GERD (gastroesophageal reflux disease)    Hyperlipidemia    declines statins   Hypertension    Hypothyroidism    Type 2 diabetes mellitus ([place]) 2000   Vitamin D deficiency      Significant Hospital Events: Including procedures, antibiotic start and stop dates in addition to other pertinent events   5/11 admitted 5/14 intubated for cardiac arrest, likely 2/2 aspiration vs hypoxia, Unasyn started 5/17 awake. Following commands. Sig difficulty w/ glycemic control. Adjustments made to basal and ss dosing 5/18 awake. VTs low on PSV. Failed initial am SBT (sats 60s; reporting SOB). Got SMPG still no BM 5/19 pulmonary edema on cxr getting lasix. SSE for constipation.  Brief arrest thought 2/2 to vagal response while turning  for enema  5/20 extubated, reintubated for post extubation stridor 5/22 trach by CCM 5/23 IR PEG, trach color   Interim History / Subjective:  IR PEG placed yesterday.  Ok for use this morning at [place] on trach collar overnight Bleeding at trach site, no bloody secretions, overnight.  Oozy this morning, patient had been touching it overnight.  Patient indicates she wants water to drink> frustrated to have to wait  Objective   Blood pressure (!) 173/71, pulse 88, temperature 98.8 F (37.1 C), temperature source Oral, resp. rate 16, height '5\' 2"'$  (1.575 m), weight 74.8 kg, SpO2 96 %.    Vent Mode: PRVC FiO2 (%):  [28 %-40 %] 28 % Set Rate:  [20 bmp] 20 bmp Vt Set:  [400 mL] 400 mL PEEP:  [5 cmH20] 5 cmH20 Plateau Pressure:  [16 cmH20] 16 cmH20   Intake/Output Summary (Last 24 hours) at 04/05/2022 0755 Last data filed at 04/04/2022 1134 Gross per 24 hour  Intake 100 ml  Output --  Net 100 ml   Filed Weights   03/25/22 0141 03/27/22 0500 03/30/22 0500  Weight: 74.9 kg 74.2 kg 74.8 kg   Examination: General:  Older female lying in bed, NAD but appears more frustrated at times this morning, freely moves her self, turns, and sits up in bed HEENT: MM pink/moist, left facial droop, midline sutured cuffed 6 shiley> dressing with bright red blood, site still slightly oozy with blood clot around flange  Neuro: Awake, f/c, MAE> no deficits appreciated CV: rr, NSR PULM:  non labored, CTA GI: soft, bs+, NT/ ND,  peg site CDI> dark bloody clot in PEG tube Extremities: warm/dry, no LE edema, purwick  Skin: no rashes  BG 130> 175 sCr 1.89> 1.66> 1.57> 1.41 Afebrile   Resolved Hospital Problem list   Epistaxis  Assessment & Plan:  Principal Problem:   CVA (cerebral vascular accident) ([place]) Active Problems:   Hypothyroidism   DM2 (diabetes mellitus, type 2) ([place])   HLD (hyperlipidemia)   HTN (hypertension)   Stage 3b chronic kidney disease (CKD) (HCC)   GERD  (gastroesophageal reflux disease)   Normocytic anemia   Dysphagia due to recent stroke   CAD (coronary artery disease)   Acute respiratory failure (HCC)   Aspiration pneumonia (HCC)   Pulmonary edema  Acute respiratory failure with hypoxia requiring MV Aspiration pneumonia RLL, persistent infiltrates on imaging but clinically improved Dysphagia from CVA Post-extubation stridor - no precedex given prior brady arrest  - completed unasyn 5/20 P:  - s/p trach 5/22 - goal is to remain on ATC for total of 48hrs, currently on ATC since after PEG placement yesterday and then may transfer out of ICU - cont full MV support> daily PSV trials> goal to ATC tolerating today so far - glycopyrrolate prn for oral secretions - VAP prevention protocol/ PPI  - SLP following,  hopefully will start PMV trials soon - s/p IR PEG 5/23 - continue with trach care> patient aware not to touch or pick at trach, main concern as not to dislodge   Medullary CVA with dysphagia and dysarthria - per Neuro - ongoing PT, OT - ASA/ brilinta, gemfibrozil, resume today 5/24 - likely will be able to go to CIR soon    Constipation, resolved - cont Senna, MiraLAX    Hyperglycemia, resolved.  Hypoglycemic overnight while NPO - stop D5LR today, restarting TF - continue SSI, change to sensitive     AKI on CKD 3b - sCr stable, continue to trend renal indices which continue to improve - strict I/Os - renally dose meds, avoid nephrotoxic meds  Hypernatremia, resolved - trend on BMET   HLD - con't crestor--seems to be tolerating (previously had listed intolerance in the chart) - con't gemfibrozil    Anemia - stable CBC, cont to occasional trend  - transfuse for Hb <7   At risk of malnutrition - restarting TF today per IR PEG -not a good candidate for cortrak due to previous severe epistaxis    Hypothyroidism - con't synthroid   Best Practice (right click and "Reselect all SmartList Selections"  daily)   Diet/type: tubefeeds- resuming TF  DVT prophylaxis: LMWH GI prophylaxis: H2B Lines: N/A Foley:  N/A Code Status:  full code Last date of multidisciplinary goals of care discussion [5/20, daughter updated) Pending 5/24   Kennieth Rad, ACNP [name] 04/05/2022, 7:55 AM  See Amion for pager If no response to pager, please call PCCM consult pager After 7:00 pm call [place]

## 2022-04-05 NOTE — Evaluation (Signed)
Clinical/Bedside Swallow Evaluation Patient Details  Name: [name] MRN: [id number] Date of Birth: 09-07-1951  Today's Date: 04/05/2022 Time: SLP Start Time (ACUTE ONLY): 0910 SLP Stop Time (ACUTE ONLY): 0940 SLP Time Calculation (min) (ACUTE ONLY): 30 min  Past Medical History:  Past Medical History:  Diagnosis Date   Anemia    Arteriosclerotic cardiovascular disease    CKD (chronic kidney disease) stage 3, GFR 30-59 ml/min (HCC)    Claudication in peripheral vascular disease (HCC)    CVA (cerebral vascular accident) ([place])    2012 and 2019-affected left side + dysarthria 2/2 severe R ACA and MCA stenosis   Epistaxis 03/29/2022   GERD (gastroesophageal reflux disease)    Hyperlipidemia    declines statins   Hypertension    Hypothyroidism    Type 2 diabetes mellitus ([place]) 2000   Vitamin D deficiency    Past Surgical History:  Past Surgical History:  Procedure Laterality Date   CESAREAN SECTION     COLONOSCOPY  09/21/2020   IR GASTROSTOMY TUBE MOD SED  04/04/2022   LEFT HEART CATHETERIZATION WITH CORONARY ANGIOGRAM N/A 03/08/2012   Procedure: LEFT HEART CATHETERIZATION WITH CORONARY ANGIOGRAM;  Surgeon: Sinclair Grooms, MD;  Location: [facility] CATH LAB;  Service: Cardiovascular;  Laterality: N/A;   LEFT HEART CATHETERIZATION WITH CORONARY ANGIOGRAM N/A 12/17/2012   Procedure: LEFT HEART CATHETERIZATION WITH CORONARY ANGIOGRAM;  Surgeon: Laverda Page, MD;  Location: [facility] CATH LAB;  Service: Cardiovascular;  Laterality: N/A;   LOOP RECORDER INSERTION N/A 03/05/2018   Procedure: LOOP RECORDER INSERTION;  Surgeon: Evans Lance, MD;  Location: [place] CV LAB;  Service: Cardiovascular;  Laterality: N/A;   LOWER EXTREMITY ANGIOGRAM N/A 12/17/2012   Procedure: LOWER EXTREMITY ANGIOGRAM;  Surgeon: Laverda Page, MD;  Location: [facility] CATH LAB;  Service: Cardiovascular;  Laterality: N/A;   TEE WITHOUT CARDIOVERSION N/A 02/28/2018   Procedure: TRANSESOPHAGEAL ECHOCARDIOGRAM (TEE);   Surgeon: Jerline Pain, MD;  Location: [facility] ENDOSCOPY;  Service: Cardiovascular;  Laterality: N/A;   TUBAL LIGATION     HPI:  Pt is a 71 y.o. female who presenting with weakness and lightheadedness. MRI 5/11: Small acute to early subacute infarct in the left aspect of the  medulla. Pt found tohave severe neuromuscular dysphagia after MBS. Pt then intubated 5/14-5/22 and trached.  PMH: CVA, DM2, HTN, CKD3b, HLD.    Assessment / Plan / Recommendation  Clinical Impression  Pt continues to demonstrate severe oropharyngeal dysphagia (evaluated prior to intubation), now with prolonged NPO status, trach. Pt with poor oral secretion management; uses suction to clear secretions. Pt unable to initiate a dry swallow. She is eager to sip water and is frusterated with ice chips and small sips; says she cannot initiate a swallow with so small a quantity. When given a larger, but still quite small sip, pt used a posterior head tilt and was noted to have repeated bobbing of the thyroid cartilage without swallow response, followed by hard coughing. Pt will need further trials prior to attempting MBS. May need quite a bit of time to rehab swallowing; fortunately PEG tube already in place. Will continue efforts. SLP Visit Diagnosis: Dysphagia, oropharyngeal phase (R13.12)    Aspiration Risk  Severe aspiration risk    Diet Recommendation NPO;Alternative means - temporary   Medication Administration: Via alternative means    Other  Recommendations Oral Care Recommendations: Oral care QID Other Recommendations: Have oral suction available    Recommendations for follow up therapy are one component  of a multi-disciplinary discharge planning process, led by the attending physician.  Recommendations may be updated based on patient status, additional functional criteria and insurance authorization.  Follow up Recommendations Acute inpatient rehab (3hours/day)      Assistance Recommended at Discharge Frequent or  constant Supervision/Assistance  Functional Status Assessment Patient has had a recent decline in their functional status and demonstrates the ability to make significant improvements in function in a reasonable and predictable amount of time.  Frequency and Duration min 2x/week  2 weeks       Prognosis Prognosis for Safe Diet Advancement: Good      Swallow Study   General HPI: Pt is a 71 y.o. female who presenting with weakness and lightheadedness. MRI 5/11: Small acute to early subacute infarct in the left aspect of the  medulla. Pt found tohave severe neuromuscular dysphagia after MBS. Pt then intubated 5/14-5/22 and trached.  PMH: CVA, DM2, HTN, CKD3b, HLD. Type of Study: Bedside Swallow Evaluation Diet Prior to this Study: NPO;PEG tube Temperature Spikes Noted: No Respiratory Status: Trach Collar;Trach History of Recent Intubation: Yes Length of Intubations (days): 9 days Date extubated: 04/03/22 Behavior/Cognition: Alert;Cooperative Oral Cavity Assessment: Within Functional Limits Oral Care Completed by SLP: No Oral Cavity - Dentition: Missing dentition Vision: Functional for self-feeding Self-Feeding Abilities: Able to feed self;Needs assist Patient Positioning: Upright in chair Baseline Vocal Quality: Hoarse Volitional Cough: Strong Volitional Swallow: Unable to elicit    Oral/Motor/Sensory Function Overall Oral Motor/Sensory Function: Mild impairment Facial ROM: Reduced left Facial Symmetry: Abnormal symmetry left Facial Strength: Reduced left Lingual ROM: Within Functional Limits Lingual Symmetry: Within Functional Limits   Ice Chips Ice chips: Impaired Presentation: Spoon Pharyngeal Phase Impairments: Unable to trigger swallow;Decreased hyoid-laryngeal movement;Cough - Immediate   Thin Liquid Thin Liquid: Impaired Presentation: Cup;Spoon Pharyngeal  Phase Impairments: Unable to trigger swallow    Nectar Thick Nectar Thick Liquid: Not tested   Honey Thick Honey  Thick Liquid: Not tested   Puree Puree: Not tested   Solid     Solid: Not tested      Jaydy Fitzhenry, Katherene Ponto 04/05/2022,11:28 AM

## 2022-04-05 NOTE — Progress Notes (Addendum)
PCCM Interval Note  Notified of HR dropping into the 20's SB while resting in bed.  Patient has been coughing but able to clear secretions without difficulty.  No change in O2 saturations, WOB, or BP.  Remains on ATC No prior BB or other meds that could contribute.  Had prn labetalol which was never given.  This has been d/cd. K this am was 4.  Will check a Mag and iCa.    Patient now resting in bed with HR varying from 50-90's.    Noted prior episode of bradycardia on 5/19 which progressed to brief cardiac arrest> this was felt related to vagal response given she was being rolled for enema +/- precedex (was only at 0.2)  P:  Cardiology consulted, appreciate assistance> unclear if this is vagal mediated vs ?sick sinus syndrome  Remains on pads with atropine at the bedside.  Add on mag and check iCa      Kennieth Rad, ACNP [facility] 04/05/2022, 12:10 PM  See Amion for pager If no response to pager, please call PCCM consult pager After 7:00 pm call [place] care attending note:  Patient is having frequent bradycardic episode, during that episode she became unresponsive, telemetry showed severe sinus bradycardia with multiple pauses Pacer pads in place, started on dopamine EP is consulting Also she is oozing from trach site, will brilanta, thrombi pad was applied Keep NPO PMN for possible pacemaker tomorrow, if she continue to have recurrence of sinus bradycardia or frequent pauses.   Total critical care time: 37 minutes  Performed by: [place] care time was exclusive of separately billable procedures and treating other patients.   Critical care was necessary to treat or prevent imminent or life-threatening deterioration.   Critical care was time spent personally by me on the following activities: development of treatment plan with patient and/or surrogate as well as nursing, discussions with consultants, evaluation of  patient's response to treatment, examination of patient, obtaining history from patient or surrogate, ordering and performing treatments and interventions, ordering and review of laboratory studies, ordering and review of radiographic studies, pulse oximetry and re-evaluation of patient's condition.   Jacky Kindle MD [facility] See Amion for pager If no response to pager, please call [phone] until 7pm After 7pm, Please call E-link [phone]

## 2022-04-05 NOTE — Progress Notes (Signed)
Patient started having frequent episodes of bradycardia down to 20s-40s with frequent pauses. Pads were placed on patient with code cart in room. CCM notified, Cardiology/EP consulted. Continued to monitor per team. At approximately 1600, patient went asystole for <20 seconds. CCM was paged and came to bedside. Per team, administered glycopyrrolate and held TF in anticipation of an EP intervention. Patient stable after this event. Will continue to monitor.

## 2022-04-05 NOTE — Progress Notes (Signed)
Cardiology resident notified of frequent brady episodes and max rate on dopamine. No new orders at this time

## 2022-04-06 ENCOUNTER — Encounter (HOSPITAL_COMMUNITY)
Admission: EM | Disposition: A | Discharge status: Nursing Home (Includes ICF) | Payer: Self-pay | Source: Home / Self Care | Attending: Internal Medicine

## 2022-04-06 DIAGNOSIS — I455 Other specified heart block: Secondary | ICD-10-CM | POA: Diagnosis not present

## 2022-04-06 DIAGNOSIS — J9601 Acute respiratory failure with hypoxia: Secondary | ICD-10-CM | POA: Diagnosis not present

## 2022-04-06 DIAGNOSIS — I63212 Cerebral infarction due to unspecified occlusion or stenosis of left vertebral arteries: Secondary | ICD-10-CM | POA: Diagnosis not present

## 2022-04-06 DIAGNOSIS — J690 Pneumonitis due to inhalation of food and vomit: Secondary | ICD-10-CM | POA: Diagnosis not present

## 2022-04-06 DIAGNOSIS — I69391 Dysphagia following cerebral infarction: Secondary | ICD-10-CM | POA: Diagnosis not present

## 2022-04-06 HISTORY — PX: TEMPORARY PACEMAKER: CATH118268

## 2022-04-06 LAB — BASIC METABOLIC PANEL
Anion gap: 10 (ref 5–15)
BUN: 33 mg/dL — ABNORMAL HIGH (ref 8–23)
CO2: 24 mmol/L (ref 22–32)
Calcium: 9.2 mg/dL (ref 8.9–10.3)
Chloride: 107 mmol/L (ref 98–111)
Creatinine, Ser: 1.38 mg/dL — ABNORMAL HIGH (ref 0.44–1.00)
GFR, Estimated: 41 mL/min — ABNORMAL LOW (ref >60)
Glucose, Bld: 157 mg/dL — ABNORMAL HIGH (ref 70–99)
Potassium: 4.3 mmol/L (ref 3.5–5.1)
Sodium: 141 mmol/L (ref 135–145)

## 2022-04-06 LAB — GLUCOSE, CAPILLARY
Glucose-Capillary: 134 mg/dL — ABNORMAL HIGH (ref 70–99)
Glucose-Capillary: 142 mg/dL — ABNORMAL HIGH (ref 70–99)
Glucose-Capillary: 143 mg/dL — ABNORMAL HIGH (ref 70–99)
Glucose-Capillary: 147 mg/dL — ABNORMAL HIGH (ref 70–99)
Glucose-Capillary: 246 mg/dL — ABNORMAL HIGH (ref 70–99)
Glucose-Capillary: 252 mg/dL — ABNORMAL HIGH (ref 70–99)

## 2022-04-06 LAB — MRSA NEXT GEN BY PCR, NASAL: MRSA by PCR Next Gen: NOT DETECTED

## 2022-04-06 LAB — TSH: TSH: 0.378 u[IU]/mL (ref 0.350–4.500)

## 2022-04-06 SURGERY — TEMPORARY PACEMAKER
Anesthesia: LOCAL

## 2022-04-06 MED ORDER — HEPARIN (PORCINE) IN NACL 1000-0.9 UT/500ML-% IV SOLN
INTRAVENOUS | Status: DC | PRN
  Administered 2022-04-06: 500 mL

## 2022-04-06 MED ORDER — FLUMAZENIL 1 MG/10ML IV SOLN
INTRAVENOUS | Status: AC
Start: 2022-04-06 — End: ?
  Filled 2022-04-06: qty 10

## 2022-04-06 MED ORDER — LIDOCAINE HCL (PF) 1 % IJ SOLN
INTRAMUSCULAR | Status: AC
  Filled 2022-04-06: qty 30

## 2022-04-06 MED ORDER — LIDOCAINE HCL (PF) 1 % IJ SOLN
INTRAMUSCULAR | Status: DC | PRN
  Administered 2022-04-06: 10 mL

## 2022-04-06 MED ORDER — MIDAZOLAM HCL 2 MG/2ML IJ SOLN
INTRAMUSCULAR | Status: DC | PRN
  Administered 2022-04-06: 1 mg via INTRAVENOUS

## 2022-04-06 MED ORDER — MIDAZOLAM HCL 2 MG/2ML IJ SOLN
INTRAMUSCULAR | Status: AC
  Filled 2022-04-06: qty 2

## 2022-04-06 SURGICAL SUPPLY — 8 items
CABLE ADAPT PACING TEMP 12FT (ADAPTER) ×1 IMPLANT
CATH S G BIP PACING (CATHETERS) ×1 IMPLANT
ELECT DEFIB PAD ADLT CADENCE (PAD) ×1 IMPLANT
PACK CARDIAC CATHETERIZATION (CUSTOM PROCEDURE TRAY) ×1 IMPLANT
SHEATH PINNACLE 6F 10CM (SHEATH) ×1 IMPLANT
SHEATH PROBE COVER 6X72 (BAG) ×1 IMPLANT
SLEEVE REPOSITIONING LENGTH 30 (MISCELLANEOUS) ×1 IMPLANT
WIRE PACING TEMP ST TIP 5 (CATHETERS) ×1 IMPLANT

## 2022-04-06 NOTE — Plan of Care (Signed)
  Problem: Education: Goal: Knowledge of patient specific risk factors will improve (INDIVIDUALIZE FOR PATIENT) Outcome: Progressing

## 2022-04-06 NOTE — Progress Notes (Addendum)
SLP Cancellation Note  Patient Details Name: [name] MRN: [id number] DOB: 11/22/1950   Cancelled treatment:       Reason Eval/Treat Not Completed: Patient not medically ready. Pt found to have recurrent long pauses, now going for pacer. Will f/u when ready   Arless Vineyard, Katherene Ponto 04/06/2022, 9:56 AM

## 2022-04-06 NOTE — Progress Notes (Signed)
OT Cancellation Note  Patient Details Name: [name] MRN: [id number] DOB: 16-May-1951   Cancelled Treatment:    Reason Eval/Treat Not Completed: Patient at procedure or test/ unavailable.  Continue efforts as appropriate.    Malahki Gasaway D Lilliam Ch[place] 04/06/2022, 10:22 AM

## 2022-04-06 NOTE — Progress Notes (Signed)
STROKE TEAM PROGRESS NOTE   INTERVAL HISTORY Patient is seen in her room with no family at bedside.  She sitting comfortably in the bed  and breathing well on her trach collar.  She has some bleeding around the tracheostomy site.  She has had several episodes of sinus pauses with a 13-second episode this morning.  Cardiology has seen and implanting temporary transvenous pacemaker this morning and likely permanent pacemaker prior to discharge. Vitals:   04/06/22 0700 04/06/22 0800 04/06/22 1006 04/06/22 1020  BP: (!) 172/53     Pulse: 81 78    Resp: (!) 9 (!) 22    Temp:      TempSrc:      SpO2: 100% 100% 94% (!) 86%  Weight:      Height:       CBC:  Recent Labs  Lab 04/04/22 0402 04/05/22 0129  WBC 8.8 10.0  HGB 9.2* 9.5*  HCT 28.0* 30.3*  MCV 88.3 89.9  PLT 319 601   Basic Metabolic Panel:  Recent Labs  Lab 03/31/22 0940 04/01/22 0052 04/05/22 0129 04/06/22 0450  NA 144   < > 140 141  K 3.7   < > 4.0 4.3  CL 108   < > 106 107  CO2 24   < > 25 24  GLUCOSE 154*   < > 143* 157*  BUN 34*   < > 31* 33*  CREATININE 1.68*   < > 1.41* 1.38*  CALCIUM 9.0   < > 9.1 9.2  MG 2.4  --  2.6*  --   PHOS 4.1  --   --   --    < > = values in this interval not displayed.   Lipid Panel:  No results for input(s): CHOL, TRIG, HDL, CHOLHDL, VLDL, LDLCALC in the last 168 hours. HgbA1c:  No results for input(s): HGBA1C in the last 168 hours. Urine Drug Screen: No results for input(s): LABOPIA, COCAINSCRNUR, LABBENZ, AMPHETMU, THCU, LABBARB in the last 168 hours.  Alcohol Level No results for input(s): ETH in the last 168 hours.  IMAGING past 24 hours No results found.  PHYSICAL EXAM General:  Alert, well-developed, well-nourished, elderly African-American lady with tracheostomy in no acute distress.  S/p PEG tube Respiratory:  on vent.  NEURO:  Awake alert, on trach collar, able to speak with the speaking valve.  Able to follow commands and nods yes/no to questions.PERRL. Right  sided nystagmus, unable to abduct left eye. blink to threat b/l. Left lower motor neuron pattern facial muscle weakness present Motor: non focal. Able to lift all ext off bed. No drift. Dysmetria present in LUE Sensory: intact to LT throughout.   NIH 4 premorbid modified Rankin 0   ASSESSMENT/PLAN [name] is a 71 y.o. 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  MRI reveals a left medullary stroke.  On 5/14, patient was intubated due to respiratory distress.    5/20: Extubated then required reintubation: plan for trach/PEG 5/21: Scheduled for PEG tomorrow. CT abd this am.  5/22 Tracheostomy placed 5/23 PEG tube placed  Stroke:  left medulalry infarct likely secondary large vessel disease of left V4 high grade stenosis vs. Short segmental occlusion MRI  Small acute to early subacute infarct in left aspect of the medulla, chronic white matter microangiopathy MRA head and neck  high grade narrowing of left V4 segment,  moderate narrowing of bilateral MCA Repeat MRI 5/13 stable left medullary and pontine infarct 2D Echo EF 89-21%, grade 1 diastolic dysfunction, no atrial level shunt Loop recorder interrogation no A-fib LDL 142 HgbA1c 9.5 VTE prophylaxis - lovenox On Plavix and pletal prior to admission, now on aspirin 325 mg per tube.  Will hold off on Brillinta given need for trach/PEG.  Therapy recommendations:  CIR Disposition:  pending  Respiratory failure Aspiration  Pt choked on saliva  Likely aspirated, on unasyn  CCM on board Emergent intubated 5/14 Trach placed 5/22  Hx of stroke First one in 2004 and on plavix 01/2011 right CC stroke with left sided weakness, balance issue and slurry speech - residue of walking with hopping on the left side - on plavix and pletal 02/2018 admitted for left-sided weakness, left upper  extremity muscle spasm, headache.  MRI showed right CR infarct.  MRA and carotid Doppler negative.  EF 60 to 65%.  LDL 87, A1c 8.9.  Discharged on Plavix and Pletal and Lopid.  Loop recorder was placed after discharge.  Cardiac arrest x 2 5/14 patient choked on saliva -> desaturation-> bradycardia-> asystole-> CPR->ROSC in 2 to 3 minutes 5/19 left decubitus positioning -> asystole -> CPR -> ROSC within 1 minute -> followed with seizure-like activity but more consistent with convulsive syncope  Hypertension Home meds:  amlodipine 10 mg daily, losartan-HCTZ 100-25 mg daily Stable Long-term BP goal normotensive  Hyperlipidemia Home meds:  gemfibrozil 600 mg daily, resumed in hospital LDL 142, goal < 70 High intensity statin not indicated due to previous intolerance Consider lipid clinic referral  Diabetes type II Uncontrolled Home meds:  lantus insulin 26 units BID HgbA1c 9.5, goal < 7.0 Now on insulin CBGs SSI Close PCP follow up for better DM control.  Dysphagia Status post cortrak -> nosebleed -> cortrak removed Has OG tube now On TF PEG placed 5/23  Other Stroke Risk Factors Advanced Age >/= 21  Former cigarette smoker Hx stroke  AKI on CKD3 Cre 1.38-1.46-1.99-1.72-1.68-1.89-> 1.66-> 1.57 CCM on board Avoid contrast when possible Renally dose medications as clinically ventilatory support.  He is short-lived  Left chest wall pain EKG no ST-T changes Chest x-ray unremarkable Likely related to chest compression on 5/14  B/l LE numbness Bilateral LE venous Doppler negative for DVT Supportive care  Other Active Problems Hypothyroidism, continue home synthroid Constipation Sinus pauses which are prolonged-temporary pacemaker plan 04/06/2022 and permanent pacemaker prior to discharge  Hospital day # 14  Patient has been unstable with multiple episodes of sinus pauses requiring emergent temporary pacemaker today and likely permanent pacemaker prior to discharge.   Recommend hold Brilinta due to bleeding around tracheostomy site and likely need for pacemaker.  She may also consider possible participation in the [place] study for stroke prevention in patients with intracranial stenosis.  Discussed with Dr. Tacy Learn critical care medicine.This patient is critically ill and at significant risk of neurological worsening, death and care requires constant monitoring of vital signs, hemodynamics,respiratory and cardiac monitoring, extensive review of multiple databases, frequent neurological assessment, discussion with family, other specialists and medical decision making of high complexity.I have made any additions or clarifications directly to the above note.This critical care time does not reflect procedure time, or teaching time or supervisory time of PA/NP/Med Resident etc but could involve care discussion time.  I spent 30 minutes of neurocritical care time  in the care of  this patient.      Antony Contras, MD Medical Director [facility] Pager: [phone] 04/06/2022 10:45 AM   To contact Stroke Continuity provider, please refer to [email or web address] After hours, contact General Neurology

## 2022-04-06 NOTE — Progress Notes (Addendum)
Called with recurrent long pause on telemetry Tele reviewed, SR with episodes of SB > asystole of 13 seconds followed by marked SB, pausing > SR The patient by RN was observed to have LOC.  In d/w CCM, pt was not being suctioned, moved, repositioned. She is ambulatory post stroke with trach/peg 2/2 her severe dysphagia  Dr. Quentin Ore and I went to bedside, reviewed telemetry She will need temp pacing wire.  Pt is nonverbal Dr. Quentin Ore discussed need she nodded yes  RN was at bedside  We will need to look towards next week for PPM, will need to be a leadless device   BP high, once temp pacer is in place, can stop dopa  Tommye Standard, PA-C

## 2022-04-06 NOTE — Progress Notes (Signed)
NAME:  [name], MRN:  [id number], DOB:  Apr 03, 1951, LOS: 46 ADMISSION DATE:  03/23/2022, CONSULTATION DATE: 03/25/2022 REFERRING MD:  Jonetta Osgood, MD , CHIEF COMPLAINT: Increasing shortness of breath  History of Present Illness:  71 year old female with poorly controlled diabetes, hypertension, CKD stage IIIb and prior history of CVA who presented with vertigo and progressive dysphagia, she was noted to have left medullary acute stroke, was admitted to hospital service for further management, stroke team is following.  This afternoon patient started with increasing dysphagia and unable to protect her airway, she required frequent suctioning, she became hypoxic and her oxygen requirement went up, now she is on Ventimask, PCCM was consulted for help evaluation and management  Pertinent  Medical History   Past Medical History:  Diagnosis Date   Anemia    Arteriosclerotic cardiovascular disease    CKD (chronic kidney disease) stage 3, GFR 30-59 ml/min (HCC)    Claudication in peripheral vascular disease ([place])    CVA (cerebral vascular accident) ([place])    2012 and 2019-affected left side + dysarthria 2/2 severe R ACA and MCA stenosis   Epistaxis 03/29/2022   GERD (gastroesophageal reflux disease)    Hyperlipidemia    declines statins   Hypertension    Hypothyroidism    Type 2 diabetes mellitus ([place]) 2000   Vitamin D deficiency      Significant Hospital Events: Including procedures, antibiotic start and stop dates in addition to other pertinent events   5/11 admitted 5/14 intubated for cardiac arrest, likely 2/2 aspiration vs hypoxia, Unasyn started 5/17 awake. Following commands. Sig difficulty w/ glycemic control. Adjustments made to basal and ss dosing 5/18 awake. VTs low on PSV. Failed initial am SBT (sats 60s; reporting SOB). Got SMPG still no BM 5/19 pulmonary edema on cxr getting lasix. SSE for constipation.  Brief arrest thought 2/2 to vagal response while turning  for enema  5/20 extubated, reintubated for post extubation stridor 5/22 trach by CCM 5/23 IR PEG, trach color  5/24 sinus brady with pause. Started dopa. EP consult 5/25 improving brady episodes on dopa, wean dopa to 5   Interim History / Subjective:  Started on dopa for sinus brady / cardiac pause  Seen by EP, no plan for ppm right now   Objective   Blood pressure (!) 172/53, pulse 78, temperature 99.4 F (37.4 C), temperature source Oral, resp. rate (!) 22, height '5\' 2"'$  (1.575 m), weight 74.8 kg, SpO2 100 %.    FiO2 (%):  [28 %] 28 %   Intake/Output Summary (Last 24 hours) at 04/06/2022 0908 Last data filed at 04/06/2022 0800 Gross per 24 hour  Intake 680.14 ml  Output 1900 ml  Net -1219.86 ml   Filed Weights   03/25/22 0141 03/27/22 0500 03/30/22 0500  Weight: 74.9 kg 74.2 kg 74.8 kg   Examination: General:  older adult F supine in bed NAD  HEENT: trach secure. Fresh and old blood clot around trach. Anicteric sclera. Pink mm  Neuro: Awake, following commands  CV: rrr s1s2 cap refill < 3sec  PULM:  Even unlabored on TC  GI: soft ndnt + PEG  Extremities: no acute joint deformity no cyanosis or clubbing   Skin: c/d/w    Resolved Hospital Problem list   Epistaxis  Assessment & Plan:  Principal Problem:   CVA (cerebral vascular accident) ([place]) Active Problems:   Hypothyroidism   DM2 (diabetes mellitus, type 2) ([place])   HLD (hyperlipidemia)   HTN (hypertension)  Stage 3b chronic kidney disease (CKD) (HCC)   GERD (gastroesophageal reflux disease)   Normocytic anemia   Dysphagia due to recent stroke   CAD (coronary artery disease)   Acute respiratory failure (HCC)   Aspiration pneumonia (HCC)   Pulmonary edema   Acute respiratory failure with hypoxia, improved RLL aspiration PNA  Dysphagia from CVA Post extubation stridor Tracheostomy status  Bleeding from tracheostomy  - no precedex given prior brady arrest  - completed unasyn 5/20 P:  -cont ATC  -  glycco for secretions  -routine trach care -SLP   -holding still holding brilinta   Medullary CVA  P - per neuro -holding brilinta, cont ASA   Intermittent Symptomatic bradycardia / cardiac pause P -cont dopa for now but continues to have significant pauses on dopa, so not sure this is of great efficacy  -appreciate EP following-- at this time considered too high of infectious risk with trach/peg for ppm. Will reach out to discuss further   HTN P -decr dopa as above   AKI on CKD 3b- improving  P -trend renal indices, UOP  - strict I/Os  HLD P -crestor, gemfibrozil   Anemia, stable  -PRN CBC  Inadequate PO intake At risk malnutrition P -EN via PEG   Hypothyroidism  P -synthroid    Best Practice (right click and "Reselect all SmartList Selections" daily)   Diet/type: tubefeeds- resuming TF  DVT prophylaxis: LMWH GI prophylaxis: H2B Lines: N/A Foley:  N/A Code Status:  full code Last date of multidisciplinary goals of care discussion [5/20, daughter updated) Pending 5/25   CRITICAL CARE Performed by: Cristal Generous   Total critical care time: 37 minutes  Critical care time was exclusive of separately billable procedures and treating other patients. Critical care was necessary to treat or prevent imminent or life-threatening deterioration.  Critical care was time spent personally by me on the following activities: development of treatment plan with patient and/or surrogate as well as nursing, discussions with consultants, evaluation of patient's response to treatment, examination of patient, obtaining history from patient or surrogate, ordering and performing treatments and interventions, ordering and review of laboratory studies, ordering and review of radiographic studies, pulse oximetry and re-evaluation of patient's condition.  Eliseo Gum MSN, AGACNP-BC [facility] for pager  04/06/2022, 9:08 AM

## 2022-04-06 NOTE — Progress Notes (Addendum)
Progress Note  Patient Name: [name] Date of Encounter: 04/06/2022  [facility] Cardiologist:  new,  Ernesta Trabert Meredith Leeds, MD   Subjective   Awake, alert, comfortable, denies any CP, or new symptoms (Nods yes/no to communicate)  Inpatient Medications    Scheduled Meds:  aspirin  81 mg Per Tube Daily   chlorhexidine  15 mL Mouth Rinse BID   Chlorhexidine Gluconate Cloth  6 each Topical Daily   diclofenac  1 patch Transdermal BID   docusate  100 mg Per Tube BID   enoxaparin (LOVENOX) injection  40 mg Subcutaneous Q24H   famotidine  20 mg Per Tube Q24H   feeding supplement (PROSource TF)  45 mL Per Tube BID   gemfibrozil  600 mg Per Tube BID AC   insulin aspart  0-9 Units Subcutaneous Q4H   insulin glargine-yfgn  15 Units Subcutaneous Daily   levothyroxine  88 mcg Per Tube Q0600   mouth rinse  15 mL Mouth Rinse 10 times per day   polyethylene glycol  17 g Per Tube BID   potassium chloride  40 mEq Per Tube Daily   rosuvastatin  10 mg Per Tube Daily   senna  2 tablet Per Tube BID   Continuous Infusions:  DOPamine 7.5 mcg/kg/min (04/06/22 0551)   feeding supplement (JEVITY 1.5 CAL/FIBER) Stopped (04/05/22 1600)   PRN Meds: acetaminophen **OR** acetaminophen (TYLENOL) oral liquid 160 mg/5 mL **OR** acetaminophen, hydrALAZINE, sodium chloride   Vital Signs    Vitals:   04/06/22 0551 04/06/22 0600 04/06/22 0630 04/06/22 0700  BP:  (!) 181/47 (!) 163/66 (!) 172/53  Pulse: 83 77 70 81  Resp: 14 (!) 0 16 (!) 9  Temp:      TempSrc:      SpO2: 100% 100% 100% 100%  Weight:      Height:        Intake/Output Summary (Last 24 hours) at 04/06/2022 0748 Last data filed at 04/06/2022 0600 Gross per 24 hour  Intake 516.67 ml  Output 1650 ml  Net -1133.33 ml      03/30/2022    5:00 AM 03/27/2022    5:00 AM 03/25/2022    1:41 AM  Last 3 Weights  Weight (lbs) 164 lb 14.5 oz 163 lb 9.3 oz 165 lb 2 oz  Weight (kg) 74.8 kg 74.2 kg 74.9 kg      Telemetry    SR,  transient brady and long pauses continued  yesterday afternoon/evening started on dopamine with improvement, has some slowing but not as long or slow.  -  reviewed by Dr. Curt Bears  ECG    No new EKGs - Personally Reviewed  Physical Exam   Examined by Dr. Curt Bears GEN: No acute distress.   Neck: No JVD, trach Cardiac: RRR, no murmurs, rubs, or gallops.  Respiratory: CTA b/l. GI: Soft, nontender, non-distended, + PEG MS: No edema; No deformity. Neuro:  nods yes/no to communicate, full neuro exam not completed Psych: difficult to assess, though very calm and cooperative  Labs    High Sensitivity Troponin:   Recent Labs  Lab 03/29/22 1501 03/29/22 1706  TROPONINIHS 24* 25*     Chemistry Recent Labs  Lab 03/31/22 0940 04/01/22 0052 04/04/22 0402 04/05/22 0129 04/06/22 0450  NA 144   < > 136 140 141  K 3.7   < > 4.1 4.0 4.3  CL 108   < > 104 106 107  CO2 24   < > 25 25 24  GLUCOSE 154*   < > 169* 143* 157*  BUN 34*   < > 31* 31* 33*  CREATININE 1.68*   < > 1.57* 1.41* 1.38*  CALCIUM 9.0   < > 9.0 9.1 9.2  MG 2.4  --   --  2.6*  --   GFRNONAA 33*   < > 35* 40* 41*  ANIONGAP 12   < > '7 9 10   '$ < > = values in this interval not displayed.    Lipids No results for input(s): CHOL, TRIG, HDL, LABVLDL, LDLCALC, CHOLHDL in the last 168 hours.  Hematology Recent Labs  Lab 04/02/22 0235 04/04/22 0402 04/05/22 0129  WBC 11.8* 8.8 10.0  RBC 2.99* 3.17* 3.37*  HGB 8.7* 9.2* 9.5*  HCT 27.0* 28.0* 30.3*  MCV 90.3 88.3 89.9  MCH 29.1 29.0 28.2  MCHC 32.2 32.9 31.4  RDW 14.1 14.1 13.8  PLT 279 319 338   Thyroid  Recent Labs  Lab 04/06/22 0450  TSH 0.378    BNP Recent Labs  Lab 04/01/22 0052  BNP 81.4    DDimer No results for input(s): DDIMER in the last 168 hours.   Radiology    IR GASTROSTOMY TUBE MOD SED  Result Date: 04/04/2022 INDICATION: Dysphagia, CVA EXAM: FLUOROSCOPIC 24 FRENCH PULL-THROUGH GASTROSTOMY Date:  04/04/2022 04/04/2022 11:38 am Radiologist:   M. Daryll Brod, MD Guidance:  Fluoroscopic MEDICATIONS: 2 g Ancef; Antibiotics were administered within 1 hour of the procedure. Glucagon 0.5 mg IV ANESTHESIA/SEDATION: Versed 1 mg IV; Fentanyl 50 mcg IV Moderate Sedation Time:  17 minute The patient was continuously monitored during the procedure by the interventional radiology nurse under my direct supervision. CONTRAST:  57m OMNIPAQUE IOHEXOL 300 MG/ML SOLN - administered into the gastric lumen. FLUOROSCOPY: Fluoroscopy Time: 2 minutes 54 seconds (99 mGy). COMPLICATIONS: None immediate. PROCEDURE: Informed consent was obtained from the patient following explanation of the procedure, risks, benefits and alternatives. The patient understands, agrees and consents for the procedure. All questions were addressed. A time out was performed. Maximal barrier sterile technique utilized including caps, mask, sterile gowns, sterile gloves, large sterile drape, hand hygiene, and betadine prep. The left upper quadrant was sterilely prepped and draped. An oral gastric catheter was inserted into the stomach under fluoroscopy. The existing nasogastric feeding tube was removed. Air was injected into the stomach for insufflation and visualization under fluoroscopy. The air distended stomach was confirmed beneath the anterior abdominal wall in the frontal and lateral projections. Under sterile conditions and local anesthesia, a 156gauge trocar needle was utilized to access the stomach percutaneously beneath the left subcostal margin. Needle position was confirmed within the stomach under biplane fluoroscopy. Contrast injection confirmed position also. A single T tack was deployed for gastropexy. Over an Amplatz guide wire, a 9-French sheath was inserted into the stomach. A snare device was utilized to capture the oral gastric catheter. The snare device was pulled retrograde from the stomach up the esophagus and out the oropharynx. The 20-French pull-through gastrostomy was  connected to the snare device and pulled antegrade through the oropharynx down the esophagus into the stomach and then through the percutaneous tract external to the patient. The gastrostomy was assembled externally. Contrast injection confirms position in the stomach. Images were obtained for documentation. The patient tolerated procedure well. No immediate complication. IMPRESSION: Fluoroscopic insertion of a 20-French "pull-through" gastrostomy. Electronically Signed   By: MJerilynn Mages  Shick M.D.   On: 04/04/2022 13:07    Cardiac Studies  Echo 03/24/22:  1. Left ventricular ejection fraction, by estimation, is 60 to 65%. The  left ventricle has normal function. The left ventricle has no regional  wall motion abnormalities. There is mild left ventricular hypertrophy.  Left ventricular diastolic parameters  are consistent with Grade I diastolic dysfunction (impaired relaxation).   2. Right ventricular systolic function is normal. The right ventricular  size is normal.   3. The mitral valve is normal in structure. No evidence of mitral valve  regurgitation.   4. There is mild calcification of the aortic valve. Aortic valve  regurgitation is not visualized.   5. The inferior vena cava is normal in size with greater than 50%  respiratory variability, suggesting right atrial pressure of 3 mmHg.   Patient Profile     71 y.o. female with a hx of prior CVA 2012 and 2019 with left side weakness and dysarthria 2/2 severe R ACA and MCA stenosis, DM 2, hypertension, CKD 3B, CAD, PAD, and hyperlipidemia   She presented to ED 03/23/2022 for weakness and lightheadedness that started 5 days prior.  She was admitted for near syncope.  MRI brain with small acute to early subacute infarct in the left aspect of the medulla with white matter microangiopathy and small remote lacunar infarcts in the basal ganglia and right caudate head.  Neurology was consulted.  She also reported choking on food.  Core track was  attempted but then removed for bleeding.  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  On 03/25/2022 she continued to choke, PCCM consulted.  left medulalry infarct likely secondary large vessel disease of left V4 high grade stenosis vs. Short segmental occlusion   Echocardiogram 03/24/2022 revealed LVEF 60 to 27%, grade 1 diastolic dysfunction, normal RV, no significant valvular disease.   ON 03/26/2022 at approximately 12:45 PM CODE BLUE was called for PEA arrest following a violent coughing episode, patient stated she felt like she was choking, she desaturated with O2 readings in the 20% range, she became asystolic.  CPR was initiated and ROSC was achieved after 2 minutes of CPR with ACLS protocol.  She received 1 dose of epinephrine.  Post ROSC patient was intubated and placed on mechanical ventilation.  Telemetry reviewed and shows normal sinus rhythm just prior to arrest.  Arrest felt secondary to aspiration versus hypoxia.  She remained intubated and failed SBT.  On 03/31/22 she arrested again with left decubitus postioning for enema - ROSC after 1 min of CPR followed by seizure like activity consistent with convulsive syncope.  She was extubated on 04/01/2022 but reintubated for stridor postextubation.   Bronchoscopy on 04/03/2022 performed for ETT placement and tracheostomy.  She underwent PEG placement 04/04/2022.  She completed Unasyn.  She was started on aspirin and Brilinta.  She is now receiving tube feeds and AKI has improved.   On 04/05/2022, telemetry showed intermittent bradycardia into the 20s and 30s.  1 pause of 3.44 seconds, several other pauses less than 3 seconds.  Cardiology was consulted.  Assessment & Plan    Intermittent SB and long pauses There is sinus node slowing leading into brady episodes and are most likely vagal mediated. Dopamine has helped.  Dr. Curt Bears has seen the patient this AM  With trach and peg her infection rate is extremely high for PPM For now,  recommendation is to continue the dopamine, follow her clinically, mobilize as able If she continues to have long pauses, symptomatic, may consider leadless pacer. For now, Ulices Maack try to avoid.  Would like to keep Brilinta on hold if able for now.     For questions or updates, please contact [place] Please consult [email or web address] for contact info under        Signed, Baldwin [place], PA-C  04/06/2022, 7:48 AM    I have seen and examined this patient with Tommye Standard.  Agree with above, note added to reflect my findings.  Patient feeling well.  Did have continued episodes of bradycardia yesterday and overnight.  Has been started on dobutamine.  Unfortunately had a 13-second episode.  Temporary pacemaker wire implanted.  GEN: Well nourished, well developed, in no acute distress  HEENT: Tracheostomy in place Neck: no JVD, carotid bruits, or masses Cardiac: RRR; no murmurs, rubs, or gallops,no edema  Respiratory:  clear to auscultation bilaterally, normal work of breathing GI: soft, nontender, nondistended, + BS, PEG tube in place MS: no deformity or atrophy  Skin: warm and dry Neuro:  Strength and sensation are intact Psych: euthymic mood, full affect   Sinus arrest: Has had multiple episodes.  Had a 13-second pause.  Is now status post temporary wire implant.  Lurlean Kernen likely need pacemaker implant.  We Kristain Hu plan for leadless pacemaker likely next week.  Unique Searfoss M. Donjuan Robison MD 04/06/2022 1:28 PM

## 2022-04-06 NOTE — Progress Notes (Signed)
Fresh bleeding noted and new cloth forming around Trach. RN made aware of the need to contact MD and make aware.

## 2022-04-06 NOTE — Progress Notes (Signed)
SR on telemetry, no pacing. Off dopamine BP good In d/w Dr. Curt Bears, pacing rate reduced to 40.  PPM will need to be a leadless device, this will be next week, will look for 1st available time  Tommye Standard, PA-C

## 2022-04-06 NOTE — Plan of Care (Signed)
  Problem: Education: Goal: Knowledge of secondary prevention will improve (SELECT ALL) Outcome: Progressing Goal: Knowledge of patient specific risk factors will improve (INDIVIDUALIZE FOR PATIENT) Outcome: Progressing   Problem: Ischemic Stroke/TIA Tissue Perfusion: Goal: Complications of ischemic stroke/TIA will be minimized Outcome: Progressing   Problem: Safety: Goal: Non-violent Restraint(s) Outcome: Progressing   Problem: Education: Goal: Ability to describe self-care measures that may prevent or decrease complications (Diabetes Survival Skills Education) will improve Outcome: Progressing Goal: Individualized Educational Video(s) Outcome: Progressing   Problem: Coping: Goal: Ability to adjust to condition or change in health will improve Outcome: Progressing   Problem: Fluid Volume: Goal: Ability to maintain a balanced intake and output will improve Outcome: Progressing   Problem: Health Behavior/Discharge Planning: Goal: Ability to identify and utilize available resources and services will improve Outcome: Progressing Goal: Ability to manage health-related needs will improve Outcome: Progressing   Problem: Metabolic: Goal: Ability to maintain appropriate glucose levels will improve Outcome: Progressing   Problem: Nutritional: Goal: Maintenance of adequate nutrition will improve Outcome: Progressing Goal: Progress toward achieving an optimal weight will improve Outcome: Progressing   Problem: Skin Integrity: Goal: Risk for impaired skin integrity will decrease Outcome: Progressing   Problem: Tissue Perfusion: Goal: Adequacy of tissue perfusion will improve Outcome: Progressing

## 2022-04-06 NOTE — H&P (View-Only) (Signed)
Called with recurrent long pause on telemetry Tele reviewed, SR with episodes of SB > asystole of 13 seconds followed by marked SB, pausing > SR The patient by RN was observed to have LOC.  In d/w CCM, pt was not being suctioned, moved, repositioned. She is ambulatory post stroke with trach/peg 2/2 her severe dysphagia  Dr. Quentin Ore and I went to bedside, reviewed telemetry She will need temp pacing wire.  Pt is nonverbal Dr. Quentin Ore discussed need she nodded yes  RN was at bedside  We will need to look towards next week for PPM, will need to be a leadless device   BP high, once temp pacer is in place, can stop dopa  Tommye Standard, PA-C

## 2022-04-06 NOTE — Progress Notes (Signed)
PT Cancellation Note  Patient Details Name: [name] MRN: [id number] DOB: Jan 13, 1951   Cancelled Treatment:    Reason Eval/Treat Not Completed: Medical issues which prohibited therapy;Patient at procedure or test/unavailable. Pt with cardiac pauses and now down for temporary pacer. Will continue to follow.   [place] 04/06/2022, 10:18 AM [place] Office [phone]

## 2022-04-06 NOTE — Interval H&P Note (Signed)
History and Physical Interval Note:  04/06/2022 10:02 AM  [name]  has presented today for surgery, with the diagnosis of chest pain.  The various methods of treatment have been discussed with the patient and family. After consideration of risks, benefits and other options for treatment, the patient has consented to  Procedure(s): TEMPORARY PACEMAKER (N/A) as a surgical intervention.  The patient's history has been reviewed, patient examined, no change in status, stable for surgery.  I have reviewed the patient's chart and labs.  Questions were answered to the patient's satisfaction.     Shelva Majestic

## 2022-04-07 ENCOUNTER — Encounter (HOSPITAL_COMMUNITY): Payer: Self-pay | Admitting: Cardiovascular Disease

## 2022-04-07 DIAGNOSIS — E1159 Type 2 diabetes mellitus with other circulatory complications: Secondary | ICD-10-CM | POA: Diagnosis not present

## 2022-04-07 DIAGNOSIS — J9601 Acute respiratory failure with hypoxia: Secondary | ICD-10-CM | POA: Diagnosis not present

## 2022-04-07 DIAGNOSIS — I63212 Cerebral infarction due to unspecified occlusion or stenosis of left vertebral arteries: Secondary | ICD-10-CM | POA: Diagnosis not present

## 2022-04-07 DIAGNOSIS — J690 Pneumonitis due to inhalation of food and vomit: Secondary | ICD-10-CM | POA: Diagnosis not present

## 2022-04-07 DIAGNOSIS — I455 Other specified heart block: Secondary | ICD-10-CM | POA: Diagnosis not present

## 2022-04-07 LAB — CBC
HCT: 24.7 % — ABNORMAL LOW (ref 36.0–46.0)
Hemoglobin: 7.8 g/dL — ABNORMAL LOW (ref 12.0–15.0)
MCH: 28.9 pg (ref 26.0–34.0)
MCHC: 31.6 g/dL (ref 30.0–36.0)
MCV: 91.5 fL (ref 80.0–100.0)
Platelets: 289 10*3/uL (ref 150–400)
RBC: 2.7 MIL/uL — ABNORMAL LOW (ref 3.87–5.11)
RDW: 14.2 % (ref 11.5–15.5)
WBC: 9.6 10*3/uL (ref 4.0–10.5)
nRBC: 0 % (ref 0.0–0.2)

## 2022-04-07 LAB — BASIC METABOLIC PANEL
Anion gap: 8 (ref 5–15)
BUN: 37 mg/dL — ABNORMAL HIGH (ref 8–23)
CO2: 23 mmol/L (ref 22–32)
Calcium: 8.7 mg/dL — ABNORMAL LOW (ref 8.9–10.3)
Chloride: 107 mmol/L (ref 98–111)
Creatinine, Ser: 1.53 mg/dL — ABNORMAL HIGH (ref 0.44–1.00)
GFR, Estimated: 36 mL/min — ABNORMAL LOW (ref >60)
Glucose, Bld: 294 mg/dL — ABNORMAL HIGH (ref 70–99)
Potassium: 4 mmol/L (ref 3.5–5.1)
Sodium: 138 mmol/L (ref 135–145)

## 2022-04-07 LAB — GLUCOSE, CAPILLARY
Glucose-Capillary: 208 mg/dL — ABNORMAL HIGH (ref 70–99)
Glucose-Capillary: 227 mg/dL — ABNORMAL HIGH (ref 70–99)
Glucose-Capillary: 264 mg/dL — ABNORMAL HIGH (ref 70–99)
Glucose-Capillary: 285 mg/dL — ABNORMAL HIGH (ref 70–99)
Glucose-Capillary: 354 mg/dL — ABNORMAL HIGH (ref 70–99)

## 2022-04-07 LAB — APTT: aPTT: 38 seconds — ABNORMAL HIGH (ref 24–36)

## 2022-04-07 LAB — PROTIME-INR
INR: 1.2 (ref 0.8–1.2)
Prothrombin Time: 15 seconds (ref 11.4–15.2)

## 2022-04-07 MED ORDER — ASPIRIN 81 MG PO CHEW
81.0000 mg | CHEWABLE_TABLET | Freq: Every day | ORAL | Status: DC
  Administered 2022-04-07 – 2022-04-18 (×12): 81 mg
  Filled 2022-04-07 (×12): qty 1

## 2022-04-07 MED ORDER — TRANEXAMIC ACID FOR INHALATION
500.0000 mg | Freq: Three times a day (TID) | RESPIRATORY_TRACT | Status: DC
  Administered 2022-04-07 – 2022-04-08 (×4): 500 mg via RESPIRATORY_TRACT
  Filled 2022-04-07 (×5): qty 10

## 2022-04-07 MED ORDER — ASPIRIN 81 MG PO CHEW
81.0000 mg | CHEWABLE_TABLET | Freq: Every day | ORAL | Status: DC
Start: 2022-04-07 — End: 2022-04-07

## 2022-04-07 MED ORDER — DEXMEDETOMIDINE HCL IN NACL 400 MCG/100ML IV SOLN
INTRAVENOUS | Status: AC
  Administered 2022-04-07: 0.4 ug/kg/h via INTRAVENOUS
  Filled 2022-04-07: qty 100

## 2022-04-07 MED ORDER — NOREPINEPHRINE 4 MG/250ML-% IV SOLN
2.0000 ug/min | INTRAVENOUS | Status: DC
  Administered 2022-04-07: 2 ug/min via INTRAVENOUS
  Filled 2022-04-07: qty 250

## 2022-04-07 MED ORDER — "THROMBI-PAD 3""X3"" EX PADS"
1.0000 | MEDICATED_PAD | Freq: Once | CUTANEOUS | Status: AC
  Administered 2022-04-07: 1 via TOPICAL
  Filled 2022-04-07: qty 1

## 2022-04-07 MED ORDER — HALOPERIDOL LACTATE 5 MG/ML IJ SOLN
1.0000 mg | Freq: Once | INTRAMUSCULAR | Status: AC
Start: 2022-04-07 — End: 2022-04-07
  Administered 2022-04-07: 1 mg via INTRAVENOUS
  Filled 2022-04-07: qty 1

## 2022-04-07 MED ORDER — SODIUM CHLORIDE 0.9 % IV BOLUS
500.0000 mL | Freq: Once | INTRAVENOUS | Status: AC
Start: 2022-04-07 — End: 2022-04-07
  Administered 2022-04-07: 500 mL via INTRAVENOUS

## 2022-04-07 MED ORDER — OXYCODONE HCL 5 MG PO TABS
5.0000 mg | ORAL_TABLET | Freq: Four times a day (QID) | ORAL | Status: DC | PRN
  Administered 2022-04-07 – 2022-04-16 (×9): 5 mg
  Filled 2022-04-07 (×10): qty 1

## 2022-04-07 MED ORDER — SODIUM CHLORIDE 0.9 % IV SOLN
250.0000 mL | INTRAVENOUS | Status: DC
  Administered 2022-04-07: 250 mL via INTRAVENOUS

## 2022-04-07 MED ORDER — ASPIRIN 81 MG PO TBEC
81.0000 mg | DELAYED_RELEASE_TABLET | Freq: Every day | ORAL | Status: DC
Start: 2022-04-07 — End: 2022-04-07

## 2022-04-07 MED ORDER — SODIUM CHLORIDE 0.9 % IV SOLN: INTRAVENOUS | Status: DC | PRN

## 2022-04-07 MED ORDER — DEXMEDETOMIDINE HCL IN NACL 400 MCG/100ML IV SOLN: 0.4000 ug/kg/h | INTRAVENOUS | Status: DC

## 2022-04-07 NOTE — Progress Notes (Signed)
[place] Progress Note Patient Name: [name] DOB: Sep 15, 1951 MRN: [id number]   Date of Service  04/07/2022  HPI/Events of Note  Hypotension - Patient became bradycardic and hypotensive on Precedex IV infusion.  Nursing turned up pacer rate to 70 and gave a 500 mL bolus of isotonic fluid.   eICU Interventions  Plan: D/C Precedex IV infusion.  Bolus with another 0.9 NaCl 500 mL IV over 30 minutes now.  Norepinephrine IV infusion via PIV. Titrate to MAP > 65.     Intervention Category Major Interventions: Hypotension - evaluation and management  [name] 04/07/2022, 6:27 AM

## 2022-04-07 NOTE — Progress Notes (Addendum)
Progress Note  Patient Name: [name] Date of Encounter: 04/07/2022  [facility] Cardiologist:  new,  Jalaiya Oyster Meredith Leeds, MD   Subjective   Wakes easily, pain at trach, none otherwise  Inpatient Medications    Scheduled Meds:  aspirin  81 mg Per Tube Daily   chlorhexidine  15 mL Mouth Rinse BID   Chlorhexidine Gluconate Cloth  6 each Topical Daily   diclofenac  1 patch Transdermal BID   docusate  100 mg Per Tube BID   enoxaparin (LOVENOX) injection  40 mg Subcutaneous Q24H   famotidine  20 mg Per Tube Q24H   feeding supplement (PROSource TF)  45 mL Per Tube BID   gemfibrozil  600 mg Per Tube BID AC   insulin aspart  0-9 Units Subcutaneous Q4H   insulin glargine-yfgn  15 Units Subcutaneous Daily   levothyroxine  88 mcg Per Tube Q0600   mouth rinse  15 mL Mouth Rinse 10 times per day   polyethylene glycol  17 g Per Tube BID   potassium chloride  40 mEq Per Tube Daily   rosuvastatin  10 mg Per Tube Daily   senna  2 tablet Per Tube BID   tranexamic acid  500 mg Nebulization Q8H   Continuous Infusions:  sodium chloride     feeding supplement (JEVITY 1.5 CAL/FIBER) 50 mL/hr at 04/06/22 2352   norepinephrine (LEVOPHED) Adult infusion 2 mcg/min (04/07/22 0640)   sodium chloride     PRN Meds: acetaminophen **OR** acetaminophen (TYLENOL) oral liquid 160 mg/5 mL **OR** acetaminophen, hydrALAZINE, sodium chloride   Vital Signs    Vitals:   04/07/22 0730 04/07/22 0732 04/07/22 0745 04/07/22 0749  BP: (!) 132/47  (!) 143/55   Pulse:    83  Resp: 20  (!) 24   Temp:  97.8 F (36.6 C)    TempSrc:  Oral    SpO2: 93%  95%   Weight:      Height:        Intake/Output Summary (Last 24 hours) at 04/07/2022 0755 Last data filed at 04/07/2022 1610 Gross per 24 hour  Intake 1488.73 ml  Output 700 ml  Net 788.73 ml      04/07/2022    5:00 AM 03/30/2022    5:00 AM 03/27/2022    5:00 AM  Last 3 Weights  Weight (lbs) 161 lb 2.5 oz 164 lb 14.5 oz 163 lb 9.3 oz   Weight (kg) 73.1 kg 74.8 kg 74.2 kg      Telemetry    SR generally 80's, had some intermittent pacing last night.  -  reviewed by Dr. Curt Bears  ECG    No new EKGs - Personally Reviewed  Physical Exam   GEN: No acute distress, sleeping, wakes easily, follows commands Neck: No JVD, trach Cardiac: RRR, no murmurs, rubs, or gallops.  Respiratory: CTA b/l. GI: Soft, nontender, non-distended, + PEG MS: No edema; No deformity. Neuro:  nods yes/no to communicate, full neuro exam not completed Psych: difficult to assess, though very calm and cooperative  Labs    High Sensitivity Troponin:   Recent Labs  Lab 03/29/22 1501 03/29/22 1706  TROPONINIHS 24* 25*     Chemistry Recent Labs  Lab 03/31/22 0940 04/01/22 0052 04/05/22 0129 04/06/22 0450 04/07/22 0115  NA 144   < > 140 141 138  K 3.7   < > 4.0 4.3 4.0  CL 108   < > 106 107 107  CO2 24   < >  $'25 24 23  'c$ GLUCOSE 154*   < > 143* 157* 294*  BUN 34*   < > 31* 33* 37*  CREATININE 1.68*   < > 1.41* 1.38* 1.53*  CALCIUM 9.0   < > 9.1 9.2 8.7*  MG 2.4  --  2.6*  --   --   GFRNONAA 33*   < > 40* 41* 36*  ANIONGAP 12   < > '9 10 8   '$ < > = values in this interval not displayed.    Lipids No results for input(s): CHOL, TRIG, HDL, LABVLDL, LDLCALC, CHOLHDL in the last 168 hours.  Hematology Recent Labs  Lab 04/04/22 0402 04/05/22 0129 04/07/22 0629  WBC 8.8 10.0 9.6  RBC 3.17* 3.37* 2.70*  HGB 9.2* 9.5* 7.8*  HCT 28.0* 30.3* 24.7*  MCV 88.3 89.9 91.5  MCH 29.0 28.2 28.9  MCHC 32.9 31.4 31.6  RDW 14.1 13.8 14.2  PLT 319 338 289   Thyroid  Recent Labs  Lab 04/06/22 0450  TSH 0.378    BNP Recent Labs  Lab 04/01/22 0052  BNP 81.4    DDimer No results for input(s): DDIMER in the last 168 hours.   Radiology    CARDIAC CATHETERIZATION  Result Date: 04/06/2022 Successful insertion of a 5 French temporary pacemaker via the right femoral vein and advanced to the right ventricular apex.  Pacemaker currently set  at a rate of 60 and MA 8. Patient is followed by EP and [name] require plans for permanent pacemaker.    Cardiac Studies     Echo 03/24/22:  1. Left ventricular ejection fraction, by estimation, is 60 to 65%. The  left ventricle has normal function. The left ventricle has no regional  wall motion abnormalities. There is mild left ventricular hypertrophy.  Left ventricular diastolic parameters  are consistent with Grade I diastolic dysfunction (impaired relaxation).   2. Right ventricular systolic function is normal. The right ventricular  size is normal.   3. The mitral valve is normal in structure. No evidence of mitral valve  regurgitation.   4. There is mild calcification of the aortic valve. Aortic valve  regurgitation is not visualized.   5. The inferior vena cava is normal in size with greater than 50%  respiratory variability, suggesting right atrial pressure of 3 mmHg.   Patient Profile     71 y.o. female with a hx of prior CVA 2012 and 2019 with left side weakness and dysarthria 2/2 severe R ACA and MCA stenosis, DM 2, hypertension, CKD 3B, CAD, PAD, and hyperlipidemia   She presented to ED 03/23/2022 for weakness and lightheadedness that started 5 days prior.  She was admitted for near syncope.  MRI brain with small acute to early subacute infarct in the left aspect of the medulla with white matter microangiopathy and small remote lacunar infarcts in the basal ganglia and right caudate head.  Neurology was consulted.  She also reported choking on food.  Core track was attempted but then removed for bleeding.  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  On 03/25/2022 she continued to choke, PCCM consulted.  left medulalry infarct likely secondary large vessel disease of left V4 high grade stenosis vs. Short segmental occlusion   Echocardiogram 03/24/2022 revealed LVEF 60 to 16%, grade 1 diastolic dysfunction, normal RV, no significant valvular disease.   ON 03/26/2022 at  approximately 12:45 PM CODE BLUE was called for PEA arrest following a violent coughing episode, patient stated she felt like she  was choking, she desaturated with O2 readings in the 20% range, she became asystolic.  CPR was initiated and ROSC was achieved after 2 minutes of CPR with ACLS protocol.  She received 1 dose of epinephrine.  Post ROSC patient was intubated and placed on mechanical ventilation.  Telemetry reviewed and shows normal sinus rhythm just prior to arrest.  Arrest felt secondary to aspiration versus hypoxia.  She remained intubated and failed SBT.  On 03/31/22 she arrested again with left decubitus postioning for enema - ROSC after 1 min of CPR followed by seizure like activity consistent with convulsive syncope.  She was extubated on 04/01/2022 but reintubated for stridor postextubation.   Bronchoscopy on 04/03/2022 performed for ETT placement and tracheostomy.  She underwent PEG placement 04/04/2022.  She completed Unasyn.  She was started on aspirin and Brilinta.  She is now receiving tube feeds and AKI has improved.   On 04/05/2022, telemetry showed intermittent bradycardia into the 20s and 30s.  1 pause of 3.44 seconds, several other pauses less than 3 seconds.  Cardiology was consulted.  Assessment & Plan    Intermittent SB and long pauses There is sinus node slowing leading into brady episodes and are still most likely vagal mediated.  Yesterday had a long 13 second asystolic event > TVP placed  Continued to have issues with bleeding at trach Last evening the pt with increasing agitation requiring meds/restarints > low BPs > some V pacing intermittently at 40 > pacing rate increased and pressor started  Hgb this Am is 7.8 (last on 5/24 was 9.5) Hypotension likely meds/anemia Continue back up pacing she is not using currently Threshold today is <28m Back up pacing rate to 60 this AM  Temp wire is via R groin, [name] need to discuss access decision for leadless     For  questions or updates, please contact CCravenPlease consult [email or web address] for contact info under        Signed, RBaldwin Jamaica PA-C  04/07/2022, 7:55 AM    I have seen and examined this patient with RTommye Standard  Agree with above, note added to reflect my findings.  Patient became hypotensive and agtitated yesterday. Improved this morning. Temp ire in place.  GEN: Well nourished, well developed, in no acute distress  HEENT: normal  Neck: no JVD, carotid bruits, or masses Cardiac: RRR; no murmurs, rubs, or gallops,no edema  Respiratory:  clear to auscultation bilaterally, normal work of breathing GI: soft, nontender, nondistended, + BS MS: no deformity or atrophy  Skin: warm and dry Neuro:  Strength and sensation are intact Psych: euthymic mood, full affect   Intermittent sinus arrest: appears vagal though long pauses. Post temp wire. Tentatively planning Micra implant next week. CVA Delirium Per primary team   [name] Needle M. Saidee Geremia MD 04/07/2022 8:24 PM

## 2022-04-07 NOTE — Progress Notes (Signed)
Cross covering ICU physician.   Called to bedside for agitation and bleeding trach. Pt has been trashing and restless in bed this evening. She does have noticeable blood at trach site that is clotting but RN has been having issues with bleeding last night and again today. Lovenox has been held. She has thrombipad applied.   Would avoid further stitch at this time. Would cont with allowing clot to form and perhaps helping pt calm so she isn't trashing so much in bed.   -Will check inr/pt, cbc -noted recent bradycardia and need for tvp however pt also req some medication for her agitation was previously given haldol by elink without benefit. Pt still has tvp in place so will start precedex and closely monitor. Hr currently in 90-100's.   Updated RN at bedside.

## 2022-04-07 NOTE — Progress Notes (Signed)
NAME:  [name], MRN:  [id number], DOB:  04-09-1951, LOS: 67 ADMISSION DATE:  03/23/2022, CONSULTATION DATE: 03/25/2022 REFERRING MD:  Jonetta Osgood, MD , CHIEF COMPLAINT: Increasing shortness of breath  History of Present Illness:  71 year old female with poorly controlled diabetes, hypertension, CKD stage IIIb and prior history of CVA who presented with vertigo and progressive dysphagia, she was noted to have left medullary acute stroke, was admitted to hospital service for further management, stroke team is following.  This afternoon patient started with increasing dysphagia and unable to protect her airway, she required frequent suctioning, she became hypoxic and her oxygen requirement went up, now she is on Ventimask, PCCM was consulted for help evaluation and management  Pertinent  Medical History   Past Medical History:  Diagnosis Date   Anemia    Arteriosclerotic cardiovascular disease    CKD (chronic kidney disease) stage 3, GFR 30-59 ml/min (HCC)    Claudication in peripheral vascular disease ([place])    CVA (cerebral vascular accident) ([place])    2012 and 2019-affected left side + dysarthria 2/2 severe R ACA and MCA stenosis   Epistaxis 03/29/2022   GERD (gastroesophageal reflux disease)    Hyperlipidemia    declines statins   Hypertension    Hypothyroidism    Type 2 diabetes mellitus ([place]) 2000   Vitamin D deficiency      Significant Hospital Events: Including procedures, antibiotic start and stop dates in addition to other pertinent events   5/11 admitted 5/14 intubated for cardiac arrest, likely 2/2 aspiration vs hypoxia, Unasyn started 5/17 awake. Following commands. Sig difficulty w/ glycemic control. Adjustments made to basal and ss dosing 5/18 awake. VTs low on PSV. Failed initial am SBT (sats 60s; reporting SOB). Got SMPG still no BM 5/19 pulmonary edema on cxr getting lasix. SSE for constipation.  Brief arrest thought 2/2 to vagal response while turning  for enema  5/20 extubated, reintubated for post extubation stridor 5/22 trach by CCM 5/23 IR PEG, trach color  5/24 sinus brady with pause. Started dopa. EP consult 5/25 improving brady episodes on dopa, wean dopa to 5   Interim History / Subjective:  Patient continued to have frequent pauses and symptomatic bradycardia Temporary pacer wire was placed yesterday, intermittently patient is requiring pacer support  Started with tracheostomy site bleeding overnight, hemoglobin dropped, currently no active bleeding  Remained agitated overnight, required Precedex infusion with a drop in heart rate to 20s, Precedex was stopped  Objective   Blood pressure (!) 116/42, pulse 89, temperature 97.8 F (36.6 C), temperature source Oral, resp. rate (!) 24, height '5\' 2"'$  (1.575 m), weight 73.1 kg, SpO2 93 %.    FiO2 (%):  [28 %] 28 %   Intake/Output Summary (Last 24 hours) at 04/07/2022 1014 Last data filed at 04/07/2022 1937 Gross per 24 hour  Intake 705.51 ml  Output 450 ml  Net 255.51 ml   Filed Weights   03/27/22 0500 03/30/22 0500 04/07/22 0500  Weight: 74.2 kg 74.8 kg 73.1 kg   Examination: General: Elderly female, lying in bed s/p trach HEENT: trach secure, old blood clot around trach. Anicteric sclera. Pink mm  Neuro: Awake, following commands, with left facial droop CV: rrr s1s2 cap refill < 3sec  PULM:  Even unlabored on TC  GI: soft ndnt + PEG  Extremities: no acute joint deformity no cyanosis or clubbing   Skin: c/d/w    Resolved Hospital Problem list   Epistaxis Hyponatremia  Assessment & Plan:  Principal Problem:   CVA (cerebral vascular accident) ([place]) Active Problems:   Hypothyroidism   DM2 (diabetes mellitus, type 2) ([place])   HLD (hyperlipidemia)   HTN (hypertension)   Stage 3b chronic kidney disease (CKD) (HCC)   GERD (gastroesophageal reflux disease)   Normocytic anemia   Dysphagia due to recent stroke   CAD (coronary artery disease)   Acute respiratory  failure (HCC)   Aspiration pneumonia (HCC)   Pulmonary edema   Sinus arrest   Acute respiratory failure with hypoxia, improved RLL aspiration PNA  Dysphagia from CVA Post extubation stridor Tracheostomy status  Bleeding from tracheostomy  Continue trach collar as long as tolerated Please do not use no precedex given prior brady arrest, added to the allergy list, Completed therapy with Unasyn Started on TXA nebs to help with tracheal site bleeding Routine trach care SLP and evaluation for Passy-Muir valve Hold Brilinta  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 Stroke team is following Holding Brilinta Continue aspirin S/p PEG Continue tube feeds  Intermittent Symptomatic bradycardia / cardiac pause S/p asystolic cardiac arrest x2 due to severe bradycardia Appreciate cardiology input Patient has temporary pacer wire Plan will be for leadless pacer next week  HTN Blood pressure is better controlled now  AKI on CKD 3b Serum creatinine is close to baseline Monitor intake and output Avoid nephrotoxic agents  HLD Continue crestor, gemfibrozil   Acute blood loss anemia on anemia of chronic disease from tracheal site bleeding  Bleeding has stopped H&H has dropped to 7.8 Transfuse PRBCs if hemoglobin less than 7   Hypothyroidism  Continue synthroid    Best Practice (right click and "Reselect all SmartList Selections" daily)   Diet/type: tubefeeds- resuming TF  DVT prophylaxis: LMWH GI prophylaxis: H2B Lines: N/A Foley:  N/A Code Status:  full code Last date of multidisciplinary goals of care discussion [5/20, daughter updated)  Total critical care time: 35 minutes  Performed by: Jacky Kindle   Critical care time was exclusive of separately billable procedures and treating other patients.   Critical care was necessary to treat or prevent imminent or life-threatening deterioration.   Critical care was time spent personally by me on the  following activities: development of treatment plan with patient and/or surrogate as well as nursing, discussions with consultants, evaluation of patient's response to treatment, examination of patient, obtaining history from patient or surrogate, ordering and performing treatments and interventions, ordering and review of laboratory studies, ordering and review of radiographic studies, pulse oximetry and re-evaluation of patient's condition.   Jacky Kindle MD [facility] See Amion for pager If no response to pager, please call [phone] until 7pm After 7pm, Please call E-link [phone]

## 2022-04-07 NOTE — Progress Notes (Signed)
Speech Language Pathology Treatment: Dysphagia;Passy Muir Speaking valve  Patient Details Name: [name] MRN: [id number] DOB: 10-04-1951 Today's Date: 04/07/2022 Time: 6754-4920 SLP Time Calculation (min) (ACUTE ONLY): 24 min  Assessment / Plan / Recommendation Clinical Impression  Pt encountered with copious dried secretions covering outside of and around trach. Assisted RN in cleaning (holding trach in place) while she changed trach tie, gauze and removed dried blood from around and under trach. SLP able to remove some blood from outer cannula and donn PMV. Pt's vocal quality is mildly wet, hoarse with low intensity and able to increase volume/clarity with verbal cues. Vital signs stable and adequate redirection of air with valve remaining on trach hub entire session and no back pressure noted when PMV removed. Pt can wear valve with staff with full supervision.  PO trials of puree and nectar thick liquids resulted in decreased control cohesion and mild transit delays. She was able to initiate a pharyngeal swallow x 1 out of 4 trials of nectar juice with significant delays and bobbing of larynx in attempts to swallow. Oral cavity and posterior oropharynx suctioned significant amount of puree with pt stating "it doesn't want to go down" which can be appreciated in pt's with medullary strokes. Continue NPO with oral care and continued ST.    HPI HPI: Pt is a 71 y.o. female who presenting with weakness and lightheadedness. MRI 5/11: Small acute to early subacute infarct in the left aspect of the  medulla. Pt found tohave severe neuromuscular dysphagia after MBS. Pt then intubated 5/14-5/22 and trached.  PMH: CVA, DM2, HTN, CKD3b, HLD.      SLP Plan  Continue with current plan of care      Recommendations for follow up therapy are one component of a multi-disciplinary discharge planning process, led by the attending physician.  Recommendations may be updated based on patient status,  additional functional criteria and insurance authorization.    Recommendations  Diet recommendations: NPO Medication Administration: Via alternative means      Patient may use Passy-Muir Speech Valve: During all therapies with supervision PMSV Supervision: Full MD: Please consider changing trach tube to : Cuffless         Oral Care Recommendations: Oral care QID Follow Up Recommendations:  (TBD) Assistance recommended at discharge: Frequent or constant Supervision/Assistance SLP Visit Diagnosis: Aphonia (R49.1);Dysphagia, oropharyngeal phase (R13.12) Plan: Continue with current plan of care           Houston Siren  04/07/2022, 3:39 PM

## 2022-04-07 NOTE — Progress Notes (Signed)
An USGPIV (ultrasound guided PIV) has been placed for short-term vasopressor infusion. A correctly placed ivWatch must be used when administering Vasopressors. Should this treatment be needed beyond 72 hours, central line access should be obtained.  It will be the responsibility of the bedside nurse to follow best practice to prevent extravasations.   ?

## 2022-04-07 NOTE — Progress Notes (Signed)
eLink Physician-Brief Progress Note Patient Name: [name] DOB: [birth year] MRN: [id number]   Date of Service  04/07/2022  HPI/Events of Note  Agitation - Patient pulling at trach which results in more bleeding. Nursing request for sedation and wrist restraints. Patient on trach collar with sat = 97% and RR = 14. QTc interval = 0.36 seconds.  eICU Interventions  Plan: Haldol 1 mg IV X 1 now. Bilateral soft wrist restraints X 5 hours.      Intervention Category Major Interventions: Delirium, psychosis, severe agitation - evaluation and management  Shervin Cypert Eugene 04/07/2022, 4:08 AM

## 2022-04-07 NOTE — Progress Notes (Signed)
Nutrition Follow-up  DOCUMENTATION CODES:   Not applicable  INTERVENTION:   Continue TF via PEG Jevity 1.5 @ 50 mL/hr (1200 mL/day) via PEG 45 mL ProSource TF - BID Provides 1880 kcal, 99 gm of protein, 912 mL free water    NUTRITION DIAGNOSIS:   Inadequate oral intake related to inability to eat as evidenced by NPO status. Ongoing.   GOAL:   Patient will meet greater than or equal to 90% of their needs Met with TF at goal   MONITOR:   Diet advancement, Labs, Weight trends, TF tolerance  REASON FOR ASSESSMENT:   Consult Enteral/tube feeding initiation and management  ASSESSMENT:   71 y.o. female presented to the ED with weakness and lightheadedness. PMH includes CVA, T2DM, HTN, CKD IIIb, and GERD. Pt admitted with CVA.  Pt transferred to cardiac ICU since last assessment after having temporary pacemaker placed 5/25 for recurrent long pauses. Permanent pacemaker planned to be placed next week. SLP wokring with pt on using PMV. Noted no diet to be initiated until after MBS or FEES is performed, SLP notes pt will need more practice with PO before objective study is performed.  Pt resting in bed at the time of assessment on TC. Awake, but did not nod or acknowledge questions this AM.   TF infusing at goal of 55m/h via PEG. Noted high CBGs early this AM. Discussed with RN, reports that glucose was not obtained at 4AM so insulin dose was not given. Currently working on correcting and trending back down. Noted levo needed early this AM, not infusing at the time of assessment.   RN reported that pt was having liquid stools this AM, bowel medications were held. Pt currently on a fiber containing formula which will add bulk to stools.   5/11 admitted 5/12 s/p cortrak placement later removed due to nose bleed 5/13 tx to ICU 5/14 developed respiratory distress due to aspiration and required emergent intubation.   5/22 - s/p trach and PEG   Intake/Output Summary (Last 24 hours)  at 04/07/2022 1308 Last data filed at 04/07/2022 08563Gross per 24 hour  Intake 673.03 ml  Output 450 ml  Net 223.03 ml  Net IO Since Admission: 6,469.49 mL [04/07/22 1308]  Nutritionally Relevant Medications: Scheduled Meds:  docusate  100 mg Per Tube BID   famotidine  20 mg Per Tube Q24H   PROSource TF  45 mL Per Tube BID   gemfibrozil  600 mg Per Tube BID AC   insulin aspart  0-9 Units Subcutaneous Q4H   insulin glargine-yfgn  15 Units Subcutaneous Daily   levothyroxine  88 mcg Per Tube Q0600   polyethylene glycol  17 g Per Tube BID   potassium chloride  40 mEq Per Tube Daily   rosuvastatin  10 mg Per Tube Daily   senna  2 tablet Per Tube BID   Continuous Infusions:  sodium chloride 250 mL (04/07/22 0931)   feeding supplement (JEVITY 1.5 CAL/FIBER) 50 mL/hr at 04/06/22 2352   norepinephrine (LEVOPHED) Adult infusion 2 mcg/min (04/07/22 0640)   sodium chloride     Labs Reviewed: CBG ranges from 134-354 mg/dL over the last 24 hours HgbA1c 9.5% 5/12  Diet Order:   Diet Order             Diet NPO time specified  Diet effective now                   EDUCATION NEEDS:   No education needs have  been identified at this time  Skin:  Skin Assessment: Reviewed RN Assessment  Last BM:  5/26 - type 6  Height:   Ht Readings from Last 1 Encounters:  03/23/22 5' 2"  (1.575 m)    Weight:   Wt Readings from Last 1 Encounters:  04/07/22 73.1 kg    Ideal Body Weight:  50 kg  BMI:  Body mass index is 29.48 kg/m.  Estimated Nutritional Needs:   Kcal:  1800-2000  Protein:  90-105 grams  Fluid:  >/= 1.8 L   Ranell Patrick, RD, LDN Clinical Dietitian RD pager # available in AMION  After hours/weekend pager # available in [facility]

## 2022-04-07 NOTE — Progress Notes (Signed)
Called daughter [name] to update on plan of care. Discussed pt behavior, medications, and restraints in place. Daughter is concerned about trach bleeding and reassured that her providers are assessing a treatment plan for this today

## 2022-04-07 NOTE — Progress Notes (Signed)
STROKE TEAM PROGRESS NOTE   INTERVAL HISTORY Patient has been transferred to cardiac ICU after getting temporary venous pacemaker yesterday by cardiology.  She had some bleeding around tracheostomy which seems to have now stopped.  She is hemodynamically stable.  Neurological exam is unchanged.  Vitals:   04/07/22 0745 04/07/22 0749 04/07/22 0800 04/07/22 0828  BP: (!) 143/55  (!) 116/42   Pulse:  83  89  Resp: (!) 24  (!) 24   Temp:      TempSrc:      SpO2: 95%  93%   Weight:      Height:       CBC:  Recent Labs  Lab 04/05/22 0129 04/07/22 0629  WBC 10.0 9.6  HGB 9.5* 7.8*  HCT 30.3* 24.7*  MCV 89.9 91.5  PLT 338 381   Basic Metabolic Panel:  Recent Labs  Lab 04/05/22 0129 04/06/22 0450 04/07/22 0115  NA 140 141 138  K 4.0 4.3 4.0  CL 106 107 107  CO2 '25 24 23  '$ GLUCOSE 143* 157* 294*  BUN 31* 33* 37*  CREATININE 1.41* 1.38* 1.53*  CALCIUM 9.1 9.2 8.7*  MG 2.6*  --   --    Lipid Panel:  No results for input(s): CHOL, TRIG, HDL, CHOLHDL, VLDL, LDLCALC in the last 168 hours. HgbA1c:  No results for input(s): HGBA1C in the last 168 hours. Urine Drug Screen: No results for input(s): LABOPIA, COCAINSCRNUR, LABBENZ, AMPHETMU, THCU, LABBARB in the last 168 hours.  Alcohol Level No results for input(s): ETH in the last 168 hours.  IMAGING past 24 hours No results found.  PHYSICAL EXAM General: Pleasant well-developed, well-nourished, elderly African-American lady with tracheostomy in no acute distress.  S/p PEG tube Respiratory:  on vent.  NEURO:  Sleepy but can be aroused easily on trach collar, able to speak with the speaking valve.  Able to follow commands and nods yes/no to questions.PERRL. Right sided nystagmus, unable to abduct left eye. blink to threat b/l. Left lower motor neuron pattern facial muscle weakness present Motor: non focal. Able to lift all ext off bed. No drift. Dysmetria present in LUE Sensory: intact to LT throughout.   NIH 4 premorbid  modified Rankin 0   ASSESSMENT/PLAN [name] is a 71 y.o. 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  MRI reveals a left medullary stroke.  On 5/14, patient was intubated due to respiratory distress.    5/20: Extubated then required reintubation: plan for trach/PEG 5/21: Scheduled for PEG tomorrow. CT abd this am.  5/22 Tracheostomy placed 5/23 PEG tube placed  Stroke:  left medulalry infarct likely secondary large vessel disease of left V4 high grade stenosis vs. Short segmental occlusion MRI  Small acute to early subacute infarct in left aspect of the medulla, chronic white matter microangiopathy MRA head and neck  high grade narrowing of left V4 segment, moderate narrowing of bilateral MCA Repeat MRI 5/13 stable left medullary and pontine infarct 2D Echo EF 82-99%, grade 1 diastolic dysfunction, no atrial level shunt Loop recorder interrogation no A-fib LDL 142 HgbA1c 9.5 VTE prophylaxis - lovenox On Plavix and pletal prior to admission, now on aspirin 325 mg per tube.  Will hold off on Brillinta for a few days given significant bleeding post trach Therapy recommendations:  CIR Disposition:  pending  Respiratory failure Aspiration  Pt choked on saliva  Likely aspirated, on unasyn  CCM on board Emergent intubated 5/14 Trach placed 5/22  Hx of stroke First one in 2004 and on plavix 01/2011 right CC stroke with left sided weakness, balance issue and slurry speech - residue of walking with hopping on the left side - on plavix and pletal 02/2018 admitted for left-sided weakness, left upper extremity muscle spasm, headache.  MRI showed right CR infarct.  MRA and carotid Doppler negative.  EF 60 to 65%.  LDL 87, A1c 8.9.  Discharged on Plavix and Pletal and Lopid.  Loop recorder was placed after discharge.  Cardiac arrest x 2 5/14  patient choked on saliva -> desaturation-> bradycardia-> asystole-> CPR->ROSC in 2 to 3 minutes 5/19 left decubitus positioning -> asystole -> CPR -> ROSC within 1 minute -> followed with seizure-like activity but more consistent with convulsive syncope  Hypertension Home meds:  amlodipine 10 mg daily, losartan-HCTZ 100-25 mg daily Stable Long-term BP goal normotensive  Hyperlipidemia Home meds:  gemfibrozil 600 mg daily, resumed in hospital LDL 142, goal < 70 High intensity statin not indicated due to previous intolerance Consider lipid clinic referral  Diabetes type II Uncontrolled Home meds:  lantus insulin 26 units BID HgbA1c 9.5, goal < 7.0 Now on insulin CBGs SSI Close PCP follow up for better DM control.  Dysphagia Status post cortrak -> nosebleed -> cortrak removed Has OG tube now On TF PEG placed 5/23  Other Stroke Risk Factors Advanced Age >/= 36  Former cigarette smoker Hx stroke  AKI on CKD3 Cre 1.38-1.46-1.99-1.72-1.68-1.89-> 1.66-> 1.57 CCM on board Avoid contrast when possible Renally dose medications as clinically ventilatory support.  He is short-lived  Left chest wall pain EKG no ST-T changes Chest x-ray unremarkable Likely related to chest compression on 5/14  B/l LE numbness Bilateral LE venous Doppler negative for DVT Supportive care  Other Active Problems Hypothyroidism, continue home synthroid Constipation Sinus pauses which are prolonged-temporary pacemaker plan 04/06/2022 and permanent pacemaker prior to discharge  Hospital day # 15  Patient has received emergent temporary pacemaker yesterday and likely permanent pacemaker prior to discharge.  Recommend hold Brilinta due to bleeding around tracheostomy site and until she gets a permanent number pacemaker.  She may also consider possible participation in the [place] study for stroke prevention in patients with intracranial stenosis.  Discussed with Dr. Tacy Learn critical care medicine.This  patient is critically ill and at significant risk of neurological worsening, death and care requires constant monitoring of vital signs, hemodynamics,respiratory and cardiac monitoring, extensive review of multiple databases, frequent neurological assessment, discussion with family, other specialists and medical decision making of high complexity.I have made any additions or clarifications directly to the above note.This critical care time does not reflect procedure time, or teaching time or supervisory time of PA/NP/Med Resident etc but could involve care discussion time.  I spent 30 minutes of neurocritical care time  in the care of  this patient.      Antony Contras, MD Medical Director [facility] St[facility] Pager: [phone] 04/07/2022 11:15 AM   To contact Stroke Continuity provider, please refer to [email or web address] After hours, contact General Neurology

## 2022-04-07 NOTE — Plan of Care (Signed)
  Problem: Education: Goal: Knowledge of secondary prevention will improve (SELECT ALL) Outcome: Progressing Goal: Knowledge of patient specific risk factors will improve (INDIVIDUALIZE FOR PATIENT) Outcome: Progressing   Problem: Ischemic Stroke/TIA Tissue Perfusion: Goal: Complications of ischemic stroke/TIA will be minimized Outcome: Progressing   Problem: Safety: Goal: Non-violent Restraint(s) Outcome: Progressing   Problem: Education: Goal: Ability to describe self-care measures that may prevent or decrease complications (Diabetes Survival Skills Education) will improve Outcome: Progressing Goal: Individualized Educational Video(s) Outcome: Progressing   Problem: Coping: Goal: Ability to adjust to condition or change in health will improve Outcome: Progressing   Problem: Fluid Volume: Goal: Ability to maintain a balanced intake and output will improve Outcome: Progressing   Problem: Health Behavior/Discharge Planning: Goal: Ability to identify and utilize available resources and services will improve Outcome: Progressing Goal: Ability to manage health-related needs will improve Outcome: Progressing   Problem: Metabolic: Goal: Ability to maintain appropriate glucose levels will improve Outcome: Progressing   Problem: Nutritional: Goal: Maintenance of adequate nutrition will improve Outcome: Progressing Goal: Progress toward achieving an optimal weight will improve Outcome: Progressing   Problem: Skin Integrity: Goal: Risk for impaired skin integrity will decrease Outcome: Progressing   Problem: Tissue Perfusion: Goal: Adequacy of tissue perfusion will improve Outcome: Progressing   Problem: Education: Goal: Knowledge of General Education information will improve Description: Including pain rating scale, medication(s)/side effects and non-pharmacologic comfort measures Outcome: Progressing   Problem: Health Behavior/Discharge Planning: Goal: Ability to  manage health-related needs will improve Outcome: Progressing   Problem: Clinical Measurements: Goal: Ability to maintain clinical measurements within normal limits will improve Outcome: Progressing Goal: Will remain free from infection Outcome: Progressing Goal: Diagnostic test results will improve Outcome: Progressing Goal: Respiratory complications will improve Outcome: Progressing Goal: Cardiovascular complication will be avoided Outcome: Progressing   Problem: Activity: Goal: Risk for activity intolerance will decrease Outcome: Progressing   Problem: Nutrition: Goal: Adequate nutrition will be maintained Outcome: Progressing   Problem: Coping: Goal: Level of anxiety will decrease Outcome: Progressing   Problem: Elimination: Goal: Will not experience complications related to bowel motility Outcome: Progressing Goal: Will not experience complications related to urinary retention Outcome: Progressing   Problem: Pain Managment: Goal: General experience of comfort will improve Outcome: Progressing   Problem: Safety: Goal: Ability to remain free from injury will improve Outcome: Progressing   Problem: Skin Integrity: Goal: Risk for impaired skin integrity will decrease Outcome: Progressing

## 2022-04-07 NOTE — Progress Notes (Signed)
[place] Progress Note Patient Name: [name] DOB: [birth year] MRN: [id number]   Date of Service  04/07/2022  HPI/Events of Note  Oozing trach site   eICU Interventions  Plan: Thrombi-pad to oozing trach site.  Hold Lovenox for tonight.      Intervention Category Major Interventions: Hemorrhage - evaluation and management  Mylie Mccurley [place] 04/07/2022, 12:07 AM

## 2022-04-07 NOTE — Progress Notes (Signed)
PT Cancellation Note  Patient Details Name: [name] MRN: [id number] DOB: Jul 13, 1951   Cancelled Treatment:    Reason Eval/Treat Not Completed: Medical issues which prohibited therapy - patient with temporary pacer via femoral access. Will follow-up for PT treatment as appropriate.  Mabeline Caras, PT, DPT Acute Rehabilitation Services  Pager [phone] Office [place] 04/07/2022, 10:57 AM

## 2022-04-07 NOTE — Progress Notes (Addendum)
0007 called elink due to bleeding at clots at trach site. Orders for thrombi pad   0354 pt still has some notable bleeding and clot formation at trach site. Pt is becoming agitated and thrashing around. She is pulling off her trach collar and swatting at staff attempting to put it back on. All attempts to reassure and address pt needs have made no difference in her behavior. Due to pt safety concerns and attempting to pull at trach and remove oxygen, pt was placed in bilat wrist restrains.   [phone] with restraints in place and haldol given, pt is scooting down in the bed and attempting to pull her trach out and pacemaker wires. Mittens placed to prevent patient removal of airway. Continue to reassure and reorient pt.Daughter [name] talked with pt on the phone and unable to calm her down.  0430 added a lap belt due to pt sitting up in bed and attempting to get up and reach trach tube. Dr. Ruthann Cancer to bedside to evaluate trach site. Sitter at bedside for pt safety   [phone] pt bradycardic and pacer assisting rate. Bp hypotensive and rate adjusted to maintain bp. Precedex drip off. Notified El[facility] (Dr. Oletta Darter)

## 2022-04-07 NOTE — Progress Notes (Signed)
eLink Physician-Brief Progress Note Patient Name: [name] DOB: 1951/04/04 MRN: [id number]   Date of Service  04/07/2022  HPI/Events of Note  Trach site continues to bleed in spite of Thrombi-pad and holding Lovenox [place].  eICU Interventions  Will request that PCCM ground team evaluate the patient at bedside.      Intervention Category Major Interventions: Hemorrhage - evaluation and management  [name] Cornelia Copa 04/07/2022, 3:54 AM

## 2022-04-07 NOTE — Progress Notes (Signed)
Pt moved to room 2 H 26 due to call light not working

## 2022-04-08 DIAGNOSIS — I69391 Dysphagia following cerebral infarction: Secondary | ICD-10-CM | POA: Diagnosis not present

## 2022-04-08 DIAGNOSIS — I639 Cerebral infarction, unspecified: Secondary | ICD-10-CM | POA: Diagnosis not present

## 2022-04-08 DIAGNOSIS — I495 Sick sinus syndrome: Secondary | ICD-10-CM

## 2022-04-08 DIAGNOSIS — J9601 Acute respiratory failure with hypoxia: Secondary | ICD-10-CM | POA: Diagnosis not present

## 2022-04-08 DIAGNOSIS — I63212 Cerebral infarction due to unspecified occlusion or stenosis of left vertebral arteries: Secondary | ICD-10-CM | POA: Diagnosis not present

## 2022-04-08 LAB — CBC
HCT: 24.4 % — ABNORMAL LOW (ref 36.0–46.0)
Hemoglobin: 7.4 g/dL — ABNORMAL LOW (ref 12.0–15.0)
MCH: 28.2 pg (ref 26.0–34.0)
MCHC: 30.3 g/dL (ref 30.0–36.0)
MCV: 93.1 fL (ref 80.0–100.0)
Platelets: 290 10*3/uL (ref 150–400)
RBC: 2.62 MIL/uL — ABNORMAL LOW (ref 3.87–5.11)
RDW: 14.4 % (ref 11.5–15.5)
WBC: 11.2 10*3/uL — ABNORMAL HIGH (ref 4.0–10.5)
nRBC: 0 % (ref 0.0–0.2)

## 2022-04-08 LAB — BASIC METABOLIC PANEL
Anion gap: 8 (ref 5–15)
BUN: 34 mg/dL — ABNORMAL HIGH (ref 8–23)
CO2: 23 mmol/L (ref 22–32)
Calcium: 8.8 mg/dL — ABNORMAL LOW (ref 8.9–10.3)
Chloride: 111 mmol/L (ref 98–111)
Creatinine, Ser: 1.47 mg/dL — ABNORMAL HIGH (ref 0.44–1.00)
GFR, Estimated: 38 mL/min — ABNORMAL LOW (ref >60)
Glucose, Bld: 381 mg/dL — ABNORMAL HIGH (ref 70–99)
Potassium: 5.1 mmol/L (ref 3.5–5.1)
Sodium: 142 mmol/L (ref 135–145)

## 2022-04-08 LAB — GLUCOSE, CAPILLARY
Glucose-Capillary: 339 mg/dL — ABNORMAL HIGH (ref 70–99)
Glucose-Capillary: 327 mg/dL — ABNORMAL HIGH (ref 70–99)
Glucose-Capillary: 368 mg/dL — ABNORMAL HIGH (ref 70–99)
Glucose-Capillary: 289 mg/dL — ABNORMAL HIGH (ref 70–99)
Glucose-Capillary: 213 mg/dL — ABNORMAL HIGH (ref 70–99)

## 2022-04-08 MED ORDER — CLOPIDOGREL BISULFATE 75 MG PO TABS
75.0000 mg | ORAL_TABLET | Freq: Every day | ORAL | Status: DC
  Administered 2022-04-08: 75 mg
  Filled 2022-04-08: qty 1

## 2022-04-08 MED ORDER — AMLODIPINE BESYLATE 5 MG PO TABS
5.0000 mg | ORAL_TABLET | Freq: Every day | ORAL | Status: DC
  Administered 2022-04-08: 5 mg
  Filled 2022-04-08: qty 1

## 2022-04-08 MED ORDER — INSULIN ASPART 100 UNIT/ML IJ SOLN
6.0000 [IU] | INTRAMUSCULAR | Status: DC
  Administered 2022-04-08 – 2022-04-09 (×5): 6 [IU] via SUBCUTANEOUS

## 2022-04-08 MED ORDER — INSULIN GLARGINE-YFGN 100 UNIT/ML ~~LOC~~ SOLN
30.0000 [IU] | Freq: Every day | SUBCUTANEOUS | Status: DC
  Administered 2022-04-08 – 2022-04-09 (×2): 30 [IU] via SUBCUTANEOUS
  Filled 2022-04-08 (×2): qty 0.3

## 2022-04-08 MED ORDER — LACTATED RINGERS IV SOLN: INTRAVENOUS | Status: AC

## 2022-04-08 MED ORDER — LORAZEPAM 2 MG/ML IJ SOLN
1.0000 mg | INTRAMUSCULAR | Status: DC | PRN
  Administered 2022-04-09 – 2022-04-13 (×14): 2 mg via INTRAVENOUS
  Administered 2022-04-14: 1 mg via INTRAVENOUS
  Administered 2022-04-14 – 2022-04-18 (×9): 2 mg via INTRAVENOUS
  Filled 2022-04-08 (×25): qty 1

## 2022-04-08 NOTE — Progress Notes (Signed)
Inpatient Diabetes Program Recommendations  AACE/ADA: New Consensus Statement on Inpatient Glycemic Control (2015)  Target Ranges:  Prepandial:   less than 140 mg/dL      Peak postprandial:   less than 180 mg/dL (1-2 hours)      Critically ill patients:  140 - 180 mg/dL    Latest Reference Range & Units 04/07/22 23:50 04/08/22 04:40 04/08/22 08:00 04/08/22 11:41  Glucose-Capillary 70 - 99 mg/dL 264 (H)  5 units Novolog  339 (H)  7 units Novolog  327 (H)  7 units Novolog  30 units Semgle '@1048'$   368 (H)  9 units Novolog '@1354'$   (H): Data is abnormally high     Home DM Meds: Lantus 26 units BID  Current Orders: Semglee 30 units Daily      Novolog Sensitive Correction Scale/ SSI (0-9 units) Q4 hours     MD- Note Semglee dose doubled today (got 15 units yesterday 5/26)  Takes total of Lantus 26 units BID at home  Getting Tube Feeds 50cc/hr now  Please consider adding Novolog Tube Feed Coverage next:  Novolog 6 units Q4 hours (give with SSI coverage) HOLD of tube feed HELD for any reason   --Will follow patient during hospitalization--  Wyn Quaker RN, MSN, CDE Diabetes Coordinator Inpatient Glycemic Control Team Team Pager: [phone] (8a-5p)

## 2022-04-08 NOTE — Progress Notes (Signed)
NAME:  [name], MRN:  [id number], DOB:  1951/05/01, LOS: 47 ADMISSION DATE:  03/23/2022, CONSULTATION DATE: 03/25/2022 REFERRING MD:  Jonetta Osgood, MD , CHIEF COMPLAINT: Increasing shortness of breath  History of Present Illness:  71 year old female with poorly controlled diabetes, hypertension, CKD stage IIIb and prior history of CVA who presented with vertigo and progressive dysphagia, she was noted to have left medullary acute stroke, was admitted to hospital service for further management, stroke team is following.  This afternoon patient started with increasing dysphagia and unable to protect her airway, she required frequent suctioning, she became hypoxic and her oxygen requirement went up, now she is on Ventimask, PCCM was consulted for help evaluation and management  Pertinent  Medical History   Past Medical History:  Diagnosis Date   Anemia    Arteriosclerotic cardiovascular disease    CKD (chronic kidney disease) stage 3, GFR 30-59 ml/min (HCC)    Claudication in peripheral vascular disease ([place])    CVA (cerebral vascular accident) ([place])    2012 and 2019-affected left side + dysarthria 2/2 severe R ACA and MCA stenosis   Epistaxis 03/29/2022   GERD (gastroesophageal reflux disease)    Hyperlipidemia    declines statins   Hypertension    Hypothyroidism    Type 2 diabetes mellitus ([place]) 2000   Vitamin D deficiency      Significant Hospital Events: Including procedures, antibiotic start and stop dates in addition to other pertinent events   5/11 admitted 5/14 intubated for cardiac arrest, likely 2/2 aspiration vs hypoxia, Unasyn started 5/17 awake. Following commands. Sig difficulty w/ glycemic control. Adjustments made to basal and ss dosing 5/18 awake. VTs low on PSV. Failed initial am SBT (sats 60s; reporting SOB). Got SMPG still no BM 5/19 pulmonary edema on cxr getting lasix. SSE for constipation.  Brief arrest thought 2/2 to vagal response while turning  for enema  5/20 extubated, reintubated for post extubation stridor 5/22 trach by CCM 5/23 IR PEG, trach color  5/24 sinus brady with pause. Started dopa. EP consult 5/25 improving brady episodes on dopa, wean dopa to 5   Interim History / Subjective:  No events. Tracheal bleeding much improved. She denies pain. Refusing care intermittently.  Objective   Blood pressure (!) 162/48, pulse 75, temperature 98.7 F (37.1 C), temperature source Oral, resp. rate 18, height '5\' 2"'$  (1.575 m), weight 73.1 kg, SpO2 92 %.    FiO2 (%):  [28 %] 28 %   Intake/Output Summary (Last 24 hours) at 04/08/2022 0841 Last data filed at 04/08/2022 0700 Gross per 24 hour  Intake 2142.4 ml  Output 1100 ml  Net 1042.4 ml    Filed Weights   03/27/22 0500 03/30/22 0500 04/07/22 0500  Weight: 74.2 kg 74.8 kg 73.1 kg   Examination: No distress Trach with some dried blood on it, no active oozing Lungs clear, abdomen benign, heart sounds regular PEG in place Ext without edema L facial droop, moves all 4 ext  Sugars up H/H down a bit Cr up a bit  Resolved Hospital Problem list   Epistaxis Hyponatremia  Assessment & Plan:  Principal Problem:   CVA (cerebral vascular accident) ([place]) Active Problems:   Hypothyroidism   DM2 (diabetes mellitus, type 2) ([place])   HLD (hyperlipidemia)   HTN (hypertension)   Stage 3b chronic kidney disease (CKD) (HCC)   GERD (gastroesophageal reflux disease)   Normocytic anemia   Dysphagia due to recent stroke   CAD (coronary artery  disease)   Acute respiratory failure (HCC)   Aspiration pneumonia (HCC)   Pulmonary edema   Sinus arrest   Acute respiratory failure with hypoxia, improved RLL aspiration PNA  Dysphagia from CVA Post extubation stridor Tracheostomy status  PEG status Bleeding from tracheostomy  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 Intermittent agitation/delrium Doing better, brillinta on hold for now, continue aspirin GDMT  for stroke per neuro RN to wipe away trach, if looks clear can downsize to 4 cuffless, consider capping trials over next couple days Precedex is now on allergy list due to her severe bradycardia when using  Intermittent Symptomatic bradycardia / cardiac pause S/p asystolic cardiac arrest x2 due to severe bradycardia Appreciate cardiology input Patient has temporary pacer wire and is still pacer depedent Plan will be for leadless pacer next week Hold brillinta until device in place  HTN- was on pressors until 5/26 - Add back amlodipine '5mg'$ /day  AKI on CKD 3b- a little worse yesterday Gentle LR x 1 day  HLD Continue crestor, gemfibrozil   Acute blood loss anemia on anemia of chronic disease from tracheal site bleeding  F/u AM CBC, brillinta on hold   Hypothyroidism  Continue synthroid   DM2 with hyperglycemia- uncontrolled, increase lantus, SSI   Best Practice (right click and "Reselect all SmartList Selections" daily)   Diet/type: tubefeeds- resuming TF  DVT prophylaxis: LMWH GI prophylaxis: H2B Lines: N/A Foley:  N/A Code Status:  full code Last date of multidisciplinary goals of care discussion [5/20, daughter updated)  Total critical care time: 19 minutes Erskine Emery MD PCCM

## 2022-04-08 NOTE — Progress Notes (Signed)
Progress Note  Patient Name: [name] Date of Encounter: 04/08/2022  Primary Cardiologist: Will Meredith Leeds, MD   Subjective   Remains with tracheostomy tube in place but without mechanical ventilation.  Inpatient Medications    Scheduled Meds:  amLODipine  5 mg Per Tube Daily   aspirin  81 mg Per Tube Daily   chlorhexidine  15 mL Mouth Rinse BID   Chlorhexidine Gluconate Cloth  6 each Topical Daily   diclofenac  1 patch Transdermal BID   docusate  100 mg Per Tube BID   enoxaparin (LOVENOX) injection  40 mg Subcutaneous Q24H   famotidine  20 mg Per Tube Q24H   feeding supplement (PROSource TF)  45 mL Per Tube BID   gemfibrozil  600 mg Per Tube BID AC   insulin aspart  0-9 Units Subcutaneous Q4H   insulin glargine-yfgn  30 Units Subcutaneous Daily   levothyroxine  88 mcg Per Tube Q0600   mouth rinse  15 mL Mouth Rinse 10 times per day   polyethylene glycol  17 g Per Tube BID   potassium chloride  40 mEq Per Tube Daily   rosuvastatin  10 mg Per Tube Daily   senna  2 tablet Per Tube BID   tranexamic acid  500 mg Nebulization Q8H   Continuous Infusions:  sodium chloride 20 mL/hr at 04/08/22 0700   sodium chloride Stopped (04/07/22 2043)   feeding supplement (JEVITY 1.5 CAL/FIBER) 1,000 mL (04/07/22 1440)   lactated ringers     PRN Meds: sodium chloride, acetaminophen **OR** acetaminophen (TYLENOL) oral liquid 160 mg/5 mL **OR** acetaminophen, hydrALAZINE, oxyCODONE, sodium chloride   Vital Signs    Vitals:   04/08/22 0600 04/08/22 0700 04/08/22 0758 04/08/22 0904  BP:      Pulse: 89 75 92   Resp: (!) '23 18 17   '$ Temp:   98.7 F (37.1 C)   TempSrc:   Oral   SpO2: 90% 92% 99% 99%  Weight:      Height:        Intake/Output Summary (Last 24 hours) at 04/08/2022 1019 Last data filed at 04/08/2022 0700 Gross per 24 hour  Intake 2033.4 ml  Output 1100 ml  Net 933.4 ml   Filed Weights   03/27/22 0500 03/30/22 0500 04/07/22 0500  Weight: 74.2 kg 74.8  kg 73.1 kg    Telemetry    Nsr with ventricular pacing - Personally Reviewed  ECG    none - Personally Reviewed  Physical Exam   GEN: No acute distress.   Neck: No JVD. Tracheostomy tube in place Cardiac: RRR, no murmurs, rubs, or gallops.  Respiratory: Clear to auscultation bilaterally. GI: Soft, nontender, non-distended  MS: No edema; No deformity. Neuro:  Nonfocal  Psych: Normal affect   Labs    Chemistry Recent Labs  Lab 04/05/22 0129 04/06/22 0450 04/07/22 0115  NA 140 141 138  K 4.0 4.3 4.0  CL 106 107 107  CO2 '25 24 23  '$ GLUCOSE 143* 157* 294*  BUN 31* 33* 37*  CREATININE 1.41* 1.38* 1.53*  CALCIUM 9.1 9.2 8.7*  GFRNONAA 40* 41* 36*  ANIONGAP '9 10 8     '$ Hematology Recent Labs  Lab 04/04/22 0402 04/05/22 0129 04/07/22 0629  WBC 8.8 10.0 9.6  RBC 3.17* 3.37* 2.70*  HGB 9.2* 9.5* 7.8*  HCT 28.0* 30.3* 24.7*  MCV 88.3 89.9 91.5  MCH 29.0 28.2 28.9  MCHC 32.9 31.4 31.6  RDW 14.1 13.8 14.2  PLT  319 338 289    Cardiac EnzymesNo results for input(s): TROPONINI in the last 168 hours. No results for input(s): TROPIPOC in the last 168 hours.   BNPNo results for input(s): BNP, PROBNP in the last 168 hours.   DDimer No results for input(s): DDIMER in the last 168 hours.   Radiology    No results found.  Cardiac Studies   none  Patient Profile     71 y.o. female admitted with respiratory failure, s/p trach, with long pauses associated with vagal spells from trach collar  Assessment & Plan    Sinus pauses - none now that temp pm wire in place. I suspect that she will require a micra later in the week. Temp wire - I turned down the PM temp wire to 40/min as it was pacing more than needed at 60/min.  Resp failure - as per primary team.       For questions or updates, please contact [place] Please consult [email or web address] for contact info under Cardiology/STEMI.      Signed, Cristopher Peru, MD  04/08/2022, 10:19 AM

## 2022-04-08 NOTE — Progress Notes (Signed)
Removed Trach sutures per Dr Tamala Julian.  Tolerated well. Site looks good with no swelling or bleeding around sutures.

## 2022-04-08 NOTE — Progress Notes (Addendum)
STROKE TEAM PROGRESS NOTE   INTERVAL HISTORY Patient has had no acute events overnight, her vital signs are stable and her neurological exam is stable.  She denies diplopia today.  Temporary pacemaker in place and functioning well.  Vitals:   04/08/22 0758 04/08/22 0904 04/08/22 1117 04/08/22 1142  BP:   (!) 157/47   Pulse: 92  68   Resp: 17  (!) 28   Temp: 98.7 F (37.1 C)   98.8 F (37.1 C)  TempSrc: Oral   Oral  SpO2: 99% 99% 99%   Weight:      Height:       CBC:  Recent Labs  Lab 04/07/22 0629 04/08/22 0943  WBC 9.6 11.2*  HGB 7.8* 7.4*  HCT 24.7* 24.4*  MCV 91.5 93.1  PLT 289 338    Basic Metabolic Panel:  Recent Labs  Lab 04/05/22 0129 04/06/22 0450 04/07/22 0115 04/08/22 0943  NA 140   < > 138 142  K 4.0   < > 4.0 5.1  CL 106   < > 107 111  CO2 25   < > 23 23  GLUCOSE 143*   < > 294* 381*  BUN 31*   < > 37* 34*  CREATININE 1.41*   < > 1.53* 1.47*  CALCIUM 9.1   < > 8.7* 8.8*  MG 2.6*  --   --   --    < > = values in this interval not displayed.    Lipid Panel:  No results for input(s): CHOL, TRIG, HDL, CHOLHDL, VLDL, LDLCALC in the last 168 hours. HgbA1c:  No results for input(s): HGBA1C in the last 168 hours. Urine Drug Screen: No results for input(s): LABOPIA, COCAINSCRNUR, LABBENZ, AMPHETMU, THCU, LABBARB in the last 168 hours.  Alcohol Level No results for input(s): ETH in the last 168 hours.  IMAGING past 24 hours No results found.  PHYSICAL EXAM General: Pleasant well-developed, well-nourished, elderly African-American lady with tracheostomy in no acute distress.  S/p PEG tube Respiratory:  on trach collar, respirations regular and unlabored  NEURO:  Sleepy but can be aroused easily on trach collar, able to speak with the speaking valve.  Able to follow commands and nods yes/no to questions.PERRL. Right sided nystagmus, unable to abduct left eye. blink to threat b/l. Left lower motor neuron pattern facial muscle weakness present Motor:  non focal. Able to lift all ext off bed. No drift. Dysmetria present in BUE Sensory: intact to LT throughout.   NIH 4 premorbid modified Rankin 0   ASSESSMENT/PLAN [name] is a 71 y.o. 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  MRI reveals a left medullary stroke.  On 5/14, patient was intubated due to respiratory distress.    Stroke:  left medulalry infarct likely secondary large vessel disease of left V4 high grade stenosis vs. Short segmental occlusion MRI  Small acute to early subacute infarct in left aspect of the medulla, chronic white matter microangiopathy MRA head and neck  high grade narrowing of left V4 segment, moderate narrowing of bilateral MCA Repeat MRI 5/13 stable left medullary and pontine infarct 2D Echo EF 25-05%, grade 1 diastolic dysfunction, no atrial level shunt Loop recorder interrogation no A-fib LDL 142 HgbA1c 9.5 VTE prophylaxis - lovenox On Plavix and pletal prior to admission, now on aspirin 81 mg per tube.  No DAPT now given  recent trach site bleeding and anemia. Recommend DAPT once Hb stable and bleeding resolves.  Therapy recommendations:  CIR Disposition:  pending  Respiratory failure Aspiration  Pt choked on saliva  Likely aspirated, on unasyn  CCM on board Emergent intubated 5/14 5/20 Extubated then required reintubation 5/22 Tracheostomy placed Doing well on trach collar  Hx of stroke First one in 2004 and on plavix 01/2011 right CC stroke with left sided weakness, balance issue and slurry speech - residue of walking with hopping on the left side - on plavix and pletal 02/2018 admitted for left-sided weakness, left upper extremity muscle spasm, headache.  MRI showed right CR infarct.  MRA and carotid Doppler negative.  EF 60 to 65%.  LDL 87, A1c 8.9.  Discharged on Plavix and Pletal and  Lopid.  Loop recorder was placed after discharge.  Cardiac arrest x 2 Sinus pause needing temporal pacing 5/14 patient choked on saliva -> desaturation-> bradycardia-> asystole-> CPR->ROSC in 2 to 3 minutes 5/19 left decubitus positioning -> asystole -> CPR -> ROSC within 1 minute -> followed with seizure-like activity but more consistent with convulsive syncope Now with sinus pauses which are prolonged-temporary pacemaker plan 04/06/2022 and likely permanent pacemaker prior to discharge Cardiology on board  Hypertension Home meds:  amlodipine 10 mg daily, losartan-HCTZ 100-25 mg daily Stable Long-term BP goal normotensive  Hyperlipidemia Home meds:  gemfibrozil 600 mg daily, resumed in hospital LDL 142, goal < 70 High intensity statin not indicated due to previous intolerance Consider lipid clinic referral  Diabetes type II Uncontrolled Home meds:  lantus insulin 26 units BID HgbA1c 9.5, goal < 7.0 Now on insulin CBGs SSI Close PCP follow up for better DM control.  Dysphagia Status post cortrak -> nosebleed -> cortrak removed PEG placed 5/23 On TF  Other Stroke Risk Factors Advanced Age >/= 41  Former cigarette smoker Hx stroke  AKI on CKD3 Cre 1.38-1.46-1.99-1.72-1.68-1.89-> 1.66-> 1.57-> 1.47 CCM on board Avoid contrast when possible Renally dose medications as clinically ventilatory support.  He is short-lived  Other Active Problems Hypothyroidism, continue home synthroid  Hospital day # Arthur , MSN, AGACNP-BC [facility] See Amion for schedule and pager information 04/08/2022 12:05 PM   ATTENDING NOTE: I reviewed above note and agree with the assessment and plan. Pt was seen and examined.   RN at bedside.  Patient lying in bed, on trach collar, not in respite distress.  Patient removed trach collar by herself, saturation stable.  Still has left CCN VI and VII palsy, however patient indicates that her diplopia has improved.   Moving all extremities.  Trach site clean and dry, no further bleeding.  Hemoglobin still low 7.4 today, slightly dropping from yesterday 7.8.  Currently on aspirin 81.  Recommend DAPT once hemoglobin stabilized and bleeding resolved.  Currently heart rate stable with intermittent PACs, no sinus pause noted on telemetry during encounter.  Still on temporary pacemaker. Cardiology on board for permanent pacemaker placement if needed.  For detailed assessment and plan, please refer to above as I have made changes wherever appropriate.   Neurology will follow peripherally. Please feel free to call call with any questions. Pt will follow up with stroke clinic NP at [facility] in about 4 weeks after discharge.    [name], MD PhD Stroke Neurology 04/08/2022 3:26 PM    To contact Stroke Continuity provider, please refer to [email or web address] After hours, contact General Neurology

## 2022-04-09 ENCOUNTER — Inpatient Hospital Stay (HOSPITAL_COMMUNITY): Payer: Medicare Other

## 2022-04-09 DIAGNOSIS — I63212 Cerebral infarction due to unspecified occlusion or stenosis of left vertebral arteries: Secondary | ICD-10-CM | POA: Diagnosis not present

## 2022-04-09 DIAGNOSIS — I455 Other specified heart block: Secondary | ICD-10-CM | POA: Diagnosis not present

## 2022-04-09 LAB — BASIC METABOLIC PANEL
Anion gap: 6 (ref 5–15)
BUN: 26 mg/dL — ABNORMAL HIGH (ref 8–23)
CO2: 27 mmol/L (ref 22–32)
Calcium: 8.7 mg/dL — ABNORMAL LOW (ref 8.9–10.3)
Chloride: 107 mmol/L (ref 98–111)
Creatinine, Ser: 1.29 mg/dL — ABNORMAL HIGH (ref 0.44–1.00)
GFR, Estimated: 45 mL/min — ABNORMAL LOW (ref >60)
Glucose, Bld: 281 mg/dL — ABNORMAL HIGH (ref 70–99)
Potassium: 4.4 mmol/L (ref 3.5–5.1)
Sodium: 140 mmol/L (ref 135–145)

## 2022-04-09 LAB — GLUCOSE, CAPILLARY
Glucose-Capillary: 173 mg/dL — ABNORMAL HIGH (ref 70–99)
Glucose-Capillary: 212 mg/dL — ABNORMAL HIGH (ref 70–99)
Glucose-Capillary: 214 mg/dL — ABNORMAL HIGH (ref 70–99)
Glucose-Capillary: 253 mg/dL — ABNORMAL HIGH (ref 70–99)
Glucose-Capillary: 255 mg/dL — ABNORMAL HIGH (ref 70–99)
Glucose-Capillary: 283 mg/dL — ABNORMAL HIGH (ref 70–99)
Glucose-Capillary: 291 mg/dL — ABNORMAL HIGH (ref 70–99)

## 2022-04-09 LAB — POCT I-STAT 7, (LYTES, BLD GAS, ICA,H+H)
Acid-Base Excess: 3 mmol/L — ABNORMAL HIGH (ref 0.0–2.0)
Acid-Base Excess: 5 mmol/L — ABNORMAL HIGH (ref 0.0–2.0)
Bicarbonate: 28.9 mmol/L — ABNORMAL HIGH (ref 20.0–28.0)
Bicarbonate: 32.9 mmol/L — ABNORMAL HIGH (ref 20.0–28.0)
Calcium, Ion: 1.26 mmol/L (ref 1.15–1.40)
Calcium, Ion: 1.27 mmol/L (ref 1.15–1.40)
HCT: 23 % — ABNORMAL LOW (ref 36.0–46.0)
HCT: 25 % — ABNORMAL LOW (ref 36.0–46.0)
Hemoglobin: 7.8 g/dL — ABNORMAL LOW (ref 12.0–15.0)
Hemoglobin: 8.5 g/dL — ABNORMAL LOW (ref 12.0–15.0)
O2 Saturation: 100 %
O2 Saturation: 95 %
Patient temperature: 97.8
Potassium: 4.3 mmol/L (ref 3.5–5.1)
Potassium: 4.9 mmol/L (ref 3.5–5.1)
Sodium: 139 mmol/L (ref 135–145)
Sodium: 140 mmol/L (ref 135–145)
TCO2: 30 mmol/L (ref 22–32)
TCO2: 35 mmol/L — ABNORMAL HIGH (ref 22–32)
pCO2 arterial: 50.2 mmHg — ABNORMAL HIGH (ref 32–48)
pCO2 arterial: 73.5 mmHg (ref 32–48)
pH, Arterial: 7.259 — ABNORMAL LOW (ref 7.35–7.45)
pH, Arterial: 7.367 (ref 7.35–7.45)
pO2, Arterial: 257 mmHg — ABNORMAL HIGH (ref 83–108)
pO2, Arterial: 79 mmHg — ABNORMAL LOW (ref 83–108)

## 2022-04-09 LAB — CBC
HCT: 23.4 % — ABNORMAL LOW (ref 36.0–46.0)
Hemoglobin: 7.3 g/dL — ABNORMAL LOW (ref 12.0–15.0)
MCH: 28.6 pg (ref 26.0–34.0)
MCHC: 31.2 g/dL (ref 30.0–36.0)
MCV: 91.8 fL (ref 80.0–100.0)
Platelets: 294 10*3/uL (ref 150–400)
RBC: 2.55 MIL/uL — ABNORMAL LOW (ref 3.87–5.11)
RDW: 14.2 % (ref 11.5–15.5)
WBC: 8.6 10*3/uL (ref 4.0–10.5)
nRBC: 0 % (ref 0.0–0.2)

## 2022-04-09 LAB — MAGNESIUM: Magnesium: 2.3 mg/dL (ref 1.7–2.4)

## 2022-04-09 MED ORDER — AMLODIPINE BESYLATE 10 MG PO TABS
10.0000 mg | ORAL_TABLET | Freq: Every day | ORAL | Status: DC
  Administered 2022-04-09 – 2022-04-18 (×10): 10 mg
  Filled 2022-04-09 (×10): qty 1

## 2022-04-09 MED ORDER — LOSARTAN POTASSIUM 50 MG PO TABS
50.0000 mg | ORAL_TABLET | Freq: Every day | ORAL | Status: DC
  Administered 2022-04-09 – 2022-04-11 (×3): 50 mg via ORAL
  Filled 2022-04-09 (×3): qty 1

## 2022-04-09 MED ORDER — INSULIN GLARGINE-YFGN 100 UNIT/ML ~~LOC~~ SOLN
5.0000 [IU] | Freq: Once | SUBCUTANEOUS | Status: AC
  Administered 2022-04-09: 5 [IU] via SUBCUTANEOUS
  Filled 2022-04-09: qty 0.05

## 2022-04-09 MED ORDER — ALBUTEROL SULFATE (2.5 MG/3ML) 0.083% IN NEBU: 2.5000 mg | INHALATION_SOLUTION | RESPIRATORY_TRACT | Status: DC | PRN

## 2022-04-09 MED ORDER — INSULIN ASPART 100 UNIT/ML IJ SOLN
8.0000 [IU] | INTRAMUSCULAR | Status: DC
  Administered 2022-04-09 – 2022-04-13 (×15): 8 [IU] via SUBCUTANEOUS

## 2022-04-09 MED ORDER — VANCOMYCIN HCL 750 MG/150ML IV SOLN
750.0000 mg | INTRAVENOUS | Status: DC
  Administered 2022-04-10 – 2022-04-11 (×2): 750 mg via INTRAVENOUS
  Filled 2022-04-09 (×2): qty 150

## 2022-04-09 MED ORDER — INSULIN GLARGINE-YFGN 100 UNIT/ML ~~LOC~~ SOLN
35.0000 [IU] | Freq: Every day | SUBCUTANEOUS | Status: DC
  Administered 2022-04-10 – 2022-04-18 (×8): 35 [IU] via SUBCUTANEOUS
  Filled 2022-04-09 (×10): qty 0.35

## 2022-04-09 MED ORDER — VANCOMYCIN HCL 1500 MG/300ML IV SOLN
1500.0000 mg | Freq: Once | INTRAVENOUS | Status: AC
  Administered 2022-04-09: 1500 mg via INTRAVENOUS
  Filled 2022-04-09: qty 300

## 2022-04-09 NOTE — Plan of Care (Signed)
  Problem: Education: Goal: Knowledge of secondary prevention will improve (SELECT ALL) Outcome: Progressing Goal: Knowledge of patient specific risk factors will improve (INDIVIDUALIZE FOR PATIENT) Outcome: Progressing   Problem: Ischemic Stroke/TIA Tissue Perfusion: Goal: Complications of ischemic stroke/TIA will be minimized Outcome: Progressing   Problem: Safety: Goal: Non-violent Restraint(s) Outcome: Progressing   Problem: Education: Goal: Ability to describe self-care measures that may prevent or decrease complications (Diabetes Survival Skills Education) will improve Outcome: Progressing Goal: Individualized Educational Video(s) Outcome: Progressing   Problem: Coping: Goal: Ability to adjust to condition or change in health will improve Outcome: Progressing   Problem: Fluid Volume: Goal: Ability to maintain a balanced intake and output will improve Outcome: Progressing   Problem: Health Behavior/Discharge Planning: Goal: Ability to identify and utilize available resources and services will improve Outcome: Progressing Goal: Ability to manage health-related needs will improve Outcome: Progressing   Problem: Metabolic: Goal: Ability to maintain appropriate glucose levels will improve Outcome: Progressing   Problem: Nutritional: Goal: Maintenance of adequate nutrition will improve Outcome: Progressing Goal: Progress toward achieving an optimal weight will improve Outcome: Progressing   Problem: Skin Integrity: Goal: Risk for impaired skin integrity will decrease Outcome: Progressing   Problem: Tissue Perfusion: Goal: Adequacy of tissue perfusion will improve Outcome: Progressing   Problem: Education: Goal: Knowledge of General Education information will improve Description: Including pain rating scale, medication(s)/side effects and non-pharmacologic comfort measures Outcome: Progressing   Problem: Health Behavior/Discharge Planning: Goal: Ability to  manage health-related needs will improve Outcome: Progressing   Problem: Clinical Measurements: Goal: Ability to maintain clinical measurements within normal limits will improve Outcome: Progressing Goal: Will remain free from infection Outcome: Progressing Goal: Diagnostic test results will improve Outcome: Progressing Goal: Respiratory complications will improve Outcome: Progressing Goal: Cardiovascular complication will be avoided Outcome: Progressing   Problem: Activity: Goal: Risk for activity intolerance will decrease Outcome: Progressing   Problem: Nutrition: Goal: Adequate nutrition will be maintained Outcome: Progressing   Problem: Coping: Goal: Level of anxiety will decrease Outcome: Progressing   Problem: Elimination: Goal: Will not experience complications related to bowel motility Outcome: Progressing Goal: Will not experience complications related to urinary retention Outcome: Progressing   Problem: Pain Managment: Goal: General experience of comfort will improve Outcome: Progressing   Problem: Safety: Goal: Ability to remain free from injury will improve Outcome: Progressing   Problem: Skin Integrity: Goal: Risk for impaired skin integrity will decrease Outcome: Progressing

## 2022-04-09 NOTE — Progress Notes (Signed)
Progress Note  Patient Name: [name] Date of Encounter: 04/09/2022  Primary Cardiologist: Will Meredith Leeds, MD   Subjective   Some confusion last night.  Inpatient Medications    Scheduled Meds:  amLODipine  5 mg Per Tube Daily   aspirin  81 mg Per Tube Daily   chlorhexidine  15 mL Mouth Rinse BID   Chlorhexidine Gluconate Cloth  6 each Topical Daily   diclofenac  1 patch Transdermal BID   docusate  100 mg Per Tube BID   enoxaparin (LOVENOX) injection  40 mg Subcutaneous Q24H   famotidine  20 mg Per Tube Q24H   feeding supplement (PROSource TF)  45 mL Per Tube BID   gemfibrozil  600 mg Per Tube BID AC   insulin aspart  0-9 Units Subcutaneous Q4H   insulin aspart  6 Units Subcutaneous Q4H   insulin glargine-yfgn  30 Units Subcutaneous Daily   levothyroxine  88 mcg Per Tube Q0600   mouth rinse  15 mL Mouth Rinse 10 times per day   polyethylene glycol  17 g Per Tube BID   rosuvastatin  10 mg Per Tube Daily   senna  2 tablet Per Tube BID   Continuous Infusions:  sodium chloride 20 mL/hr at 04/09/22 0700   sodium chloride Stopped (04/07/22 2043)   feeding supplement (JEVITY 1.5 CAL/FIBER) 1,000 mL (04/08/22 1230)   lactated ringers 75 mL/hr at 04/09/22 0700   PRN Meds: sodium chloride, acetaminophen **OR** acetaminophen (TYLENOL) oral liquid 160 mg/5 mL **OR** acetaminophen, hydrALAZINE, LORazepam, oxyCODONE, sodium chloride   Vital Signs    Vitals:   04/09/22 0400 04/09/22 0500 04/09/22 0600 04/09/22 0700  BP: (!) 159/59  (!) 168/50   Pulse: 80 84 66 86  Resp: (!) 27 (!) 30 18 (!) 27  Temp:      TempSrc:      SpO2: 95% 95% 96% 100%  Weight:  73.1 kg    Height:  '5\' 2"'$  (1.575 m)      Intake/Output Summary (Last 24 hours) at 04/09/2022 0810 Last data filed at 04/09/2022 0700 Gross per 24 hour  Intake 3187.04 ml  Output 500 ml  Net 2687.04 ml   Filed Weights   03/30/22 0500 04/07/22 0500 04/09/22 0500  Weight: 74.8 kg 73.1 kg 73.1 kg     Telemetry    Nsr with some ventricular pacing - Personally Reviewed  ECG    none - Personally Reviewed  Physical Exam   GEN: No acute distress.   Neck: No JVD; trach tube in place. Cardiac: RRR, no murmurs, rubs, or gallops.  Respiratory: Clear to auscultation bilaterally. GI: Soft, nontender, non-distended  MS: No edema; No deformity. Neuro:  Nonfocal  Psych: Normal affect   Labs    Chemistry Recent Labs  Lab 04/07/22 0115 04/08/22 0943 04/09/22 0540  NA 138 142 140  K 4.0 5.1 4.4  CL 107 111 107  CO2 '23 23 27  '$ GLUCOSE 294* 381* 281*  BUN 37* 34* 26*  CREATININE 1.53* 1.47* 1.29*  CALCIUM 8.7* 8.8* 8.7*  GFRNONAA 36* 38* 45*  ANIONGAP '8 8 6     '$ Hematology Recent Labs  Lab 04/07/22 0629 04/08/22 0943 04/09/22 0540  WBC 9.6 11.2* 8.6  RBC 2.70* 2.62* 2.55*  HGB 7.8* 7.4* 7.3*  HCT 24.7* 24.4* 23.4*  MCV 91.5 93.1 91.8  MCH 28.9 28.2 28.6  MCHC 31.6 30.3 31.2  RDW 14.2 14.4 14.2  PLT 289 290 294  Cardiac EnzymesNo results for input(s): TROPONINI in the last 168 hours. No results for input(s): TROPIPOC in the last 168 hours.   BNPNo results for input(s): BNP, PROBNP in the last 168 hours.   DDimer No results for input(s): DDIMER in the last 168 hours.   Radiology    No results found.  Cardiac Studies   none  Patient Profile     71 y.o. female admitted with respiratory failure, and developed pauses associated with manipulation of her trach tube.  Assessment & Plan    Sinus pauses - she is s/p temp PM Temp PM - she was undersensing and I increased the sensitivity of her PM from 2 to 1 mV and now sensing appropriately. Resp failure - her trach has been downsized. She is slowly improving.       For questions or updates, please contact [place] Please consult [email or web address] for contact info under Cardiology/STEMI.      Signed, Cristopher Peru, MD  04/09/2022, 8:10 AM

## 2022-04-09 NOTE — Progress Notes (Signed)
Capped trach patient able to speak and cough. Sat on roomair is 100%.  Will continue to monitor.

## 2022-04-09 NOTE — Progress Notes (Signed)
RT called to bedside by RN. Pt had pulled #6 Shiley trach out sats remained 100% on RA. #4 Shiley lubricated and placed w/o difficulty. Positive color change, bilateral breath sounds. RT will cont to monitor as needed.

## 2022-04-09 NOTE — Progress Notes (Signed)
*  Late note* Given Duoneb x2 due to airway tightness w/ improvement. RN aware, NP aware. RT will cont to monitor as needed.

## 2022-04-09 NOTE — Progress Notes (Signed)
Pharmacy Antibiotic Note  [name] is a 71 y.o. female admitted on 03/23/2022 with  near syncope and nausea .  Pharmacy has been consulted for vancomycin dosing for surgical prophylaxis due patient movement and TVP possibly being dislodged and increasing risk of infection.   Tmax of 99 today, currently afebrile, wbc normal. Scr up slightly to 1.29. Do no foresee an extended length of antibiotics so will use traditional dosing.   Plan: Vancomycin 1500 mg x1 then  IV every '750mg'$  q 24 hours.  Goal trough 10-15 mcg/mL. Follow for any signs or symptoms of infection indicating need to broaden coverage  Height: '5\' 2"'$  (157.5 cm) Weight: 73.1 kg (161 lb 2.5 oz) IBW/kg (Calculated) : 50.1  Temp (24hrs), Avg:98.4 F (36.9 C), Min:97.3 F (36.3 C), Max:98.9 F (37.2 C)  Recent Labs  Lab 04/04/22 0402 04/05/22 0129 04/06/22 0450 04/07/22 0115 04/07/22 0629 04/08/22 0943 04/09/22 0540  WBC 8.8 10.0  --   --  9.6 11.2* 8.6  CREATININE 1.57* 1.41* 1.38* 1.53*  --  1.47* 1.29*    Estimated Creatinine Clearance: 38 mL/min (A) (by C-G formula based on SCr of 1.29 mg/dL (H)).    Allergies  Allergen Reactions   Dilantin [Phenytoin Sodium Extended]     Rash/itching   Precedex [Dexmedetomidine Hcl In Nacl] Other (See Comments)    Bradycardic cardiac arrest   Insulins Rash    Humalog 75/25 only   Statins Nausea Only and Rash    "sick to my stomach"; describes "rash and a lump" while taking statins   Thank you for allowing pharmacy to be a part of this patient's care.  Erin Hearing PharmD., BCPS Clinical Pharmacist 04/09/2022 6:02 PM

## 2022-04-09 NOTE — Progress Notes (Addendum)
   PCCM Interval Progress Note  S:  Called to [employer] room by elink for bradycardia.  O: Blood pressure (!) 164/70, pulse 91, temperature 98.4 F (36.9 C), temperature source Axillary, resp. rate (!) 25, height '5\' 2"'$  (1.575 m), weight 73.1 kg, SpO2 100 %.    FiO2 (%):  [21 %-28 %] 21 %   Intake/Output Summary (Last 24 hours) at 04/09/2022 2144 Last data filed at 04/09/2022 1900 Gross per 24 hour  Intake 2408.91 ml  Output --  Net 2408.91 ml   Filed Weights   03/30/22 0500 04/07/22 0500 04/09/22 0500  Weight: 74.8 kg 73.1 kg 73.1 kg    Exam: Neuro: withdraws to pain in all extremites, opens eyes to pain, PERRL 42m CV ST, RRR, no murmurs Pulm: wheezes, trach midline, 6.0 trach in place,  ABG 7.25/73/257/32. CXR: no obvious pneumo, infiltrate, effusion    A: P: Bradycardia, ?secondary to hypoxic event s/p mucous plug Acute respiratory failure with hypercapnia Secondary to mucous plug? RT reports patient difficult to bag initially. ABG 7.25/73/257/32. ? Progressive hypercapnia throughout day Acute metabolic encephalopathy, suspect secondary to hypercarbia -Switched to 6.0 cuffed by RT, appreciate asst. -LTVV strategy with tidal volumes of 4-8 cc/kg ideal body weight -Goal plateau pressures less than 30 and driving pressures less than 15 -Wean PEEP/FiO2 for SpO2 92-98% -VAP bundle -Daily SAT and SBT -Repeat ABG in 1 hour -If bradycardia repeats start TCP. -PRN albuterol -If neuro exam not back to baseline in 4-6 hours obtain head CT.    TRedmond School, MSN, APRN, AGACNP-BC [facility]  04/09/2022 , 9:44 PM  Please see [email or web address] for pager details  If no response, please call [phone] After hours, please call Elink at [phone]   Addendum: notified by Dr. YJames Ivanoff Small right apical pneumo on CXR. Will repeat CXR in AM.

## 2022-04-09 NOTE — Progress Notes (Signed)
Pt desat with trach capped.  Placed on 3 L [place] sat up to 100%. Tolerating well.  Will continue to monitor.

## 2022-04-09 NOTE — Progress Notes (Deleted)
Patient's cap removed from trach due to desat into 70's placed on 50% ATC sat up to 100%. Was able to decrease back to 28% ATC.

## 2022-04-09 NOTE — Progress Notes (Signed)
Inpatient Diabetes Program Recommendations  AACE/ADA: New Consensus Statement on Inpatient Glycemic Control (2015)  Target Ranges:  Prepandial:   less than 140 mg/dL      Peak postprandial:   less than 180 mg/dL (1-2 hours)      Critically ill patients:  140 - 180 mg/dL    Latest Reference Range & Units 04/08/22 21:25 04/09/22 04:32 04/09/22 08:10  Glucose-Capillary 70 - 99 mg/dL 213 (H)  9 units Novolog  291 (H)  11 units Novolog  212 (H)  9 units Novolog  30 units Semglee '@1003'$   (H): Data is abnormally high  Home DM Meds: Lantus 26 units BID   Current Orders: Semglee 30 units Daily                            Novolog Sensitive Correction Scale/ SSI (0-9 units) Q4 hours       Novolog 6 units Q4 hours    MD- Note Novolog TF coverage started late yesterday afternoon  CBGs remain >200 today  Please consider:  1. Increase Semglee slightly to 35 units Daily  2. Increase Novolog Tube Feed Coverage to 8 units Q4 hours    --Will follow patient during hospitalization--  Wyn Quaker RN, MSN, CDE Diabetes Coordinator Inpatient Glycemic Control Team Team Pager: [phone] (8a-5p)

## 2022-04-09 NOTE — Progress Notes (Signed)
NAME:  [name], MRN:  [id number], DOB:  1951-02-26, LOS: 32 ADMISSION DATE:  03/23/2022, CONSULTATION DATE: 03/25/2022 REFERRING MD:  Jonetta Osgood, MD , CHIEF COMPLAINT: Increasing shortness of breath  History of Present Illness:  71 year old female with poorly controlled diabetes, hypertension, CKD stage IIIb and prior history of CVA who presented with vertigo and progressive dysphagia, she was noted to have left medullary acute stroke, was admitted to hospital service for further management, stroke team is following.  This afternoon patient started with increasing dysphagia and unable to protect her airway, she required frequent suctioning, she became hypoxic and her oxygen requirement went up, now she is on Ventimask, PCCM was consulted for help evaluation and management  Pertinent  Medical History   Past Medical History:  Diagnosis Date   Anemia    Arteriosclerotic cardiovascular disease    CKD (chronic kidney disease) stage 3, GFR 30-59 ml/min (HCC)    Claudication in peripheral vascular disease ([place])    CVA (cerebral vascular accident) ([place])    2012 and 2019-affected left side + dysarthria 2/2 severe R ACA and MCA stenosis   Epistaxis 03/29/2022   GERD (gastroesophageal reflux disease)    Hyperlipidemia    declines statins   Hypertension    Hypothyroidism    Type 2 diabetes mellitus ([place]) 2000   Vitamin D deficiency      Significant Hospital Events: Including procedures, antibiotic start and stop dates in addition to other pertinent events   5/11 admitted 5/14 intubated for cardiac arrest, likely 2/2 aspiration vs hypoxia, Unasyn started 5/17 awake. Following commands. Sig difficulty w/ glycemic control. Adjustments made to basal and ss dosing 5/18 awake. VTs low on PSV. Failed initial am SBT (sats 60s; reporting SOB). Got SMPG still no BM 5/19 pulmonary edema on cxr getting lasix. SSE for constipation.  Brief arrest thought 2/2 to vagal response while turning  for enema  5/20 extubated, reintubated for post extubation stridor 5/22 trach by CCM 5/23 IR PEG, trach color  5/24 sinus brady with pause. Started dopa. EP consult 5/25 improving brady episodes on dopa, wean dopa to 5   Interim History / Subjective:  Pulled out her trach overnight. Now a 4-0 is in place. She will not talk to me this AM as she is angry at being in hospital.  Objective   Blood pressure (!) 168/50, pulse 86, temperature 98.9 F (37.2 C), temperature source Oral, resp. rate (!) 27, height '5\' 2"'$  (1.575 m), weight 73.1 kg, SpO2 100 %.    FiO2 (%):  [28 %] 28 %   Intake/Output Summary (Last 24 hours) at 04/09/2022 0824 Last data filed at 04/09/2022 0700 Gross per 24 hour  Intake 3187.04 ml  Output 500 ml  Net 2687.04 ml    Filed Weights   03/30/22 0500 04/07/22 0500 04/09/22 0500  Weight: 74.8 kg 73.1 kg 73.1 kg   Examination: No distress, resting comfortably Trach with small thick secretion Lungs clear, heart sounds regular Heart with irregular rhythm, still requiring backup pacing intermittently, set at 40 Ext warm Sheath with pacer CDI Will not cooperate with neuro exam or talk to me  Sugars up but has been refusing insulin H/H stable Cr improved  Resolved Hospital Problem list   Epistaxis Hyponatremia  Assessment & Plan:  Principal Problem:   CVA (cerebral vascular accident) ([place]) Active Problems:   Hypothyroidism   DM2 (diabetes mellitus, type 2) ([place])   HLD (hyperlipidemia)   HTN (hypertension)   Stage 3b  chronic kidney disease (CKD) (HCC)   GERD (gastroesophageal reflux disease)   Normocytic anemia   Dysphagia due to recent stroke   CAD (coronary artery disease)   Acute respiratory failure (HCC)   Aspiration pneumonia (HCC)   Pulmonary edema   Sinus arrest   Acute respiratory failure with hypoxia, improved RLL aspiration PNA  Dysphagia from CVA Post extubation stridor Tracheostomy status  PEG status Bleeding from tracheostomy   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 Intermittent agitation/delrium Doing better, brillinta on hold for now, continue aspirin GDMT for stroke per neuro 4-0 cuffless in place, will see if she will allow capping  Intermittent Symptomatic bradycardia / cardiac pause S/p asystolic cardiac arrest x2 due to severe bradycardia Appreciate cardiology input Patient has temporary pacer wire and is still pacer depedent Plan will be for leadless pacer next week Hold brillinta until device in place  HTN- higher today - Increase amlodipine  AKI on CKD 3b- improved - Monitor  HLD - Continue crestor, gemfibrozil   Acute blood loss anemia on anemia of chronic disease from tracheal site bleeding  brillinta on hold until ppm placed   Hypothyroidism  Continue synthroid   DM2 with hyperglycemia- will see how settles out depending on her acceptance of insulin  GOC- long talk yesterday with daughters, RN at bedside and patient with PMV.  Patient did not display insight sufficient to dictate her Olga so we are going to need to rely on daughters for this.   I think she can have a reasonable recovery and we should push forward for now.  Daughters agree   [employer] (right click and "Production designer, theatre/television/film" daily)   Diet/type: tubefeeds DVT prophylaxis: LMWH GI prophylaxis: H2B Lines: N/A Foley:  N/A Code Status:  full code Last date of multidisciplinary goals of care discussion [5/27, daughter updated)  Erskine Emery MD PCCM

## 2022-04-09 NOTE — Progress Notes (Signed)
RT replaced 4 cuffless  to a 6 cuffed trach due to not protecting her airway and needing to be placed on a vent. Positive easy cap detected and return volumes on ventilator.Vital signs stable at this time.

## 2022-04-09 NOTE — Progress Notes (Addendum)
eLink Physician-Brief Progress Note Patient Name: [name] DOB: [birth year] MRN: [id number]   Date of Service  04/09/2022  HPI/Events of Note  Notified of hypoxia then subsequent bradycardia in the 30s.   On camera assessment, pt was being bagged.  O2 sats in the 90s and HR in the 100s. Pt never lost pulses but she was unresponsive. Glucose in the 200s.   Trache changed to a cuffed Shiley 6 and patient placed on the vent.   Pt was then opening her eyes and had a gag reflex.   eICU Interventions  Get ABG on the vent.  Follow up CXR.  Will consider repeat CT head if no improvement in mental status.      Intervention Category Major Interventions: Hypoxemia - evaluation and management  Elsie Lincoln 04/09/2022, 9:51 PM  10:18 PM Received a call from radiology regarding CXR whosing small right apical pneumothorax.   BP 106/53, HR 103, RR 20, O2 sats 100% on TV 400, rate 25, PEEP 5, 40% FiO2.   ABG 7.259/73.5/257.  Plan> Repeat CXR in the AM. Repeat ABG in an hour.

## 2022-04-09 NOTE — Progress Notes (Signed)
[facility] Progress Note Patient Name: [name] DOB: June 25, 1951 MRN: [id number]   Date of Service  04/09/2022  HPI/Events of Note  Patient pulling trach and TVP so RN requested wrist restraints.   eICU Interventions  Order placed      Intervention Category Major Interventions: Respiratory failure - evaluation and management  Margaretmary Lombard 04/09/2022, 6:56 AM

## 2022-04-10 ENCOUNTER — Inpatient Hospital Stay (HOSPITAL_COMMUNITY): Payer: Medicare Other

## 2022-04-10 DIAGNOSIS — I455 Other specified heart block: Secondary | ICD-10-CM | POA: Diagnosis not present

## 2022-04-10 DIAGNOSIS — Z515 Encounter for palliative care: Secondary | ICD-10-CM

## 2022-04-10 DIAGNOSIS — Z7189 Other specified counseling: Secondary | ICD-10-CM

## 2022-04-10 DIAGNOSIS — J9601 Acute respiratory failure with hypoxia: Secondary | ICD-10-CM | POA: Diagnosis not present

## 2022-04-10 DIAGNOSIS — I69391 Dysphagia following cerebral infarction: Secondary | ICD-10-CM | POA: Diagnosis not present

## 2022-04-10 DIAGNOSIS — I63212 Cerebral infarction due to unspecified occlusion or stenosis of left vertebral arteries: Secondary | ICD-10-CM | POA: Diagnosis not present

## 2022-04-10 DIAGNOSIS — I469 Cardiac arrest, cause unspecified: Secondary | ICD-10-CM | POA: Diagnosis not present

## 2022-04-10 LAB — COMPREHENSIVE METABOLIC PANEL
ALT: 13 U/L (ref 0–44)
AST: 18 U/L (ref 15–41)
Albumin: 2.4 g/dL — ABNORMAL LOW (ref 3.5–5.0)
Alkaline Phosphatase: 74 U/L (ref 38–126)
Anion gap: 10 (ref 5–15)
BUN: 23 mg/dL (ref 8–23)
CO2: 27 mmol/L (ref 22–32)
Calcium: 8.9 mg/dL (ref 8.9–10.3)
Chloride: 103 mmol/L (ref 98–111)
Creatinine, Ser: 1.36 mg/dL — ABNORMAL HIGH (ref 0.44–1.00)
GFR, Estimated: 42 mL/min — ABNORMAL LOW (ref >60)
Glucose, Bld: 325 mg/dL — ABNORMAL HIGH (ref 70–99)
Potassium: 4.5 mmol/L (ref 3.5–5.1)
Sodium: 140 mmol/L (ref 135–145)
Total Bilirubin: 0.2 mg/dL — ABNORMAL LOW (ref 0.3–1.2)
Total Protein: 6.7 g/dL (ref 6.5–8.1)

## 2022-04-10 LAB — CBC WITH DIFFERENTIAL/PLATELET
Abs Immature Granulocytes: 0.07 10*3/uL (ref 0.00–0.07)
Basophils Absolute: 0 10*3/uL (ref 0.0–0.1)
Basophils Relative: 0 %
Eosinophils Absolute: 0 10*3/uL (ref 0.0–0.5)
Eosinophils Relative: 0 %
HCT: 24.7 % — ABNORMAL LOW (ref 36.0–46.0)
Hemoglobin: 7.5 g/dL — ABNORMAL LOW (ref 12.0–15.0)
Immature Granulocytes: 1 %
Lymphocytes Relative: 11 %
Lymphs Abs: 1 10*3/uL (ref 0.7–4.0)
MCH: 28.4 pg (ref 26.0–34.0)
MCHC: 30.4 g/dL (ref 30.0–36.0)
MCV: 93.6 fL (ref 80.0–100.0)
Monocytes Absolute: 0.6 10*3/uL (ref 0.1–1.0)
Monocytes Relative: 6 %
Neutro Abs: 7.6 10*3/uL (ref 1.7–7.7)
Neutrophils Relative %: 82 %
Platelets: 297 10*3/uL (ref 150–400)
RBC: 2.64 MIL/uL — ABNORMAL LOW (ref 3.87–5.11)
RDW: 14.2 % (ref 11.5–15.5)
WBC: 9.3 10*3/uL (ref 4.0–10.5)
nRBC: 0 % (ref 0.0–0.2)

## 2022-04-10 LAB — GLUCOSE, CAPILLARY
Glucose-Capillary: 177 mg/dL — ABNORMAL HIGH (ref 70–99)
Glucose-Capillary: 206 mg/dL — ABNORMAL HIGH (ref 70–99)
Glucose-Capillary: 225 mg/dL — ABNORMAL HIGH (ref 70–99)
Glucose-Capillary: 233 mg/dL — ABNORMAL HIGH (ref 70–99)
Glucose-Capillary: 267 mg/dL — ABNORMAL HIGH (ref 70–99)
Glucose-Capillary: 96 mg/dL (ref 70–99)
Glucose-Capillary: 98 mg/dL (ref 70–99)

## 2022-04-10 LAB — MAGNESIUM: Magnesium: 2.3 mg/dL (ref 1.7–2.4)

## 2022-04-10 NOTE — Progress Notes (Signed)
Updated daughter by phone at length about events yesterday and plan to keep patient sedated until PPM followed by what should be a fairly rapid decannulation process.  Erskine Emery MD PCCM

## 2022-04-10 NOTE — Progress Notes (Signed)
SLP Cancellation Note  Patient Details Name: [name] MRN: [id number] DOB: 07-21-1951   Cancelled treatment:        Pt not protecting airway yesterday and required full vent support and receiving Ativan every 4 hours due to agitation yesterday per RN. Last time SLP worked with pt she had cuffless trach and on trach collar. ST will continue to follow.    Houston Siren 04/10/2022, 11:14 AM

## 2022-04-10 NOTE — Progress Notes (Addendum)
Inpatient Diabetes Program Recommendations  AACE/ADA: New Consensus Statement on Inpatient Glycemic Control (2015)  Target Ranges:  Prepandial:   less than 140 mg/dL      Peak postprandial:   less than 180 mg/dL (1-2 hours)      Critically ill patients:  140 - 180 mg/dL    Latest Reference Range & Units 04/09/22 08:10 04/09/22 11:50 04/09/22 15:40 04/09/22 19:34 04/09/22 21:29 04/09/22 23:32 04/10/22 03:46  Glucose-Capillary 70 - 99 mg/dL 212 (H) 173 (H) 214 (H) 253 (H) 255 (H) 283 (H) 233 (H)   Home DM Meds: Lantus 26 units BID   Current Orders: Semglee 35 units Daily                            Novolog Sensitive Correction Scale/ SSI (0-9 units) Q4 hours       Novolog 8 units Q4 hours  Jevity 1.5  71m/hour  CBGs remain >200 today  Please consider:  1. Increase Novolog Tube Feed Coverage to 10 units Q4 hours    --Will follow patient during hospitalization--  STama HeadingsRN, MSN, BC-ADM Inpatient Diabetes Coordinator Team Pager [phone](8a-5p)

## 2022-04-10 NOTE — Progress Notes (Signed)
Progress Note  Patient Name: [name] Date of Encounter: 04/10/2022  [facility] Cardiologist: Will Meredith Leeds, MD  Subjective   Agitated this morning trying to climb out of bed. HR 100s. Pacemaker no longer in place.  Inpatient Medications    Scheduled Meds:  amLODipine  10 mg Per Tube Daily   aspirin  81 mg Per Tube Daily   chlorhexidine  15 mL Mouth Rinse BID   Chlorhexidine Gluconate Cloth  6 each Topical Daily   diclofenac  1 patch Transdermal BID   docusate  100 mg Per Tube BID   enoxaparin (LOVENOX) injection  40 mg Subcutaneous Q24H   famotidine  20 mg Per Tube Q24H   feeding supplement (PROSource TF)  45 mL Per Tube BID   gemfibrozil  600 mg Per Tube BID AC   insulin aspart  0-9 Units Subcutaneous Q4H   insulin aspart  8 Units Subcutaneous Q4H   insulin glargine-yfgn  35 Units Subcutaneous Daily   levothyroxine  88 mcg Per Tube Q0600   losartan  50 mg Oral Daily   mouth rinse  15 mL Mouth Rinse 10 times per day   polyethylene glycol  17 g Per Tube BID   rosuvastatin  10 mg Per Tube Daily   senna  2 tablet Per Tube BID   Continuous Infusions:  sodium chloride 20 mL/hr at 04/10/22 0600   sodium chloride Stopped (04/07/22 2043)   feeding supplement (JEVITY 1.5 CAL/FIBER) 1,000 mL (04/08/22 1230)   vancomycin     PRN Meds: sodium chloride, acetaminophen **OR** acetaminophen (TYLENOL) oral liquid 160 mg/5 mL **OR** acetaminophen, albuterol, hydrALAZINE, LORazepam, oxyCODONE, sodium chloride   Vital Signs    Vitals:   04/10/22 0354 04/10/22 0400 04/10/22 0500 04/10/22 0600  BP:  (!) 115/57 (!) 152/69 (!) 144/56  Pulse:  97 95 (!) 103  Resp:  (!) 0 (!) 4 (!) 23  Temp: 99 F (37.2 C)     TempSrc: Axillary     SpO2:  100% 100% 99%  Weight:      Height:        Intake/Output Summary (Last 24 hours) at 04/10/2022 0742 Last data filed at 04/10/2022 0600 Gross per 24 hour  Intake 1056.01 ml  Output 350 ml  Net 706.01 ml      04/09/2022     5:00 AM 04/07/2022    5:00 AM  Last 3 Weights  Weight (lbs) 161 lb 2.5 oz 161 lb 2.5 oz  Weight (kg) 73.1 kg 73.1 kg      Telemetry    Sinus tachycardia. Yesterday evening brief bradycardic episode that returned to sinus tachycardia. -  Personally Reviewed  ECG    Personally Reviewed  Physical Exam   GEN: Agitated trying to get out of bed. Neck: Trach collar in place. Cardiac: Tachycardic. Regular rhythm. Warm extremities.  Respiratory: Clear to auscultation bilaterally. GI: Soft, nontender, non-distended  MS: No edema; No deformity. Neuro:  Nonfocal  Psych: Agitated.   Labs    High Sensitivity Troponin:   Recent Labs  Lab 03/29/22 1501 03/29/22 1706  TROPONINIHS 24* 25*     Chemistry Recent Labs  Lab 04/05/22 0129 04/06/22 0450 04/08/22 0943 04/09/22 0540 04/09/22 2142 04/09/22 2345 04/10/22 0001  NA 140   < > 142 140 140 139 140  K 4.0   < > 5.1 4.4 4.9 4.3 4.5  CL 106   < > 111 107  --   --  103  CO2 25   < > 23 27  --   --  27  GLUCOSE 143*   < > 381* 281*  --   --  325*  BUN 31*   < > 34* 26*  --   --  23  CREATININE 1.41*   < > 1.47* 1.29*  --   --  1.36*  CALCIUM 9.1   < > 8.8* 8.7*  --   --  8.9  MG 2.6*  --   --  2.3  --   --  2.3  PROT  --   --   --   --   --   --  6.7  ALBUMIN  --   --   --   --   --   --  2.4*  AST  --   --   --   --   --   --  18  ALT  --   --   --   --   --   --  13  ALKPHOS  --   --   --   --   --   --  74  BILITOT  --   --   --   --   --   --  0.2*  GFRNONAA 40*   < > 38* 45*  --   --  42*  ANIONGAP 9   < > 8 6  --   --  10   < > = values in this interval not displayed.    Lipids No results for input(s): CHOL, TRIG, HDL, LABVLDL, LDLCALC, CHOLHDL in the last 168 hours.  Hematology Recent Labs  Lab 04/08/22 0943 04/09/22 0540 04/09/22 2142 04/09/22 2345 04/10/22 0001  WBC 11.2* 8.6  --   --  9.3  RBC 2.62* 2.55*  --   --  2.64*  HGB 7.4* 7.3* 8.5* 7.8* 7.5*  HCT 24.4* 23.4* 25.0* 23.0* 24.7*  MCV 93.1 91.8   --   --  93.6  MCH 28.2 28.6  --   --  28.4  MCHC 30.3 31.2  --   --  30.4  RDW 14.4 14.2  --   --  14.2  PLT 290 294  --   --  297   Thyroid  Recent Labs  Lab 04/06/22 0450  TSH 0.378    BNPNo results for input(s): BNP, PROBNP in the last 168 hours.  DDimer No results for input(s): DDIMER in the last 168 hours.   Radiology    DG CHEST PORT 1 VIEW  Result Date: 04/10/2022 CLINICAL DATA:  71 year old female with history of respiratory failure. EXAM: PORTABLE CHEST 1 VIEW COMPARISON:  Chest x-ray 04/09/2022. FINDINGS: A tracheostomy tube is in place with tip 5.4 cm above the carina. Transcutaneous defibrillator pad projecting over the lower left hemithorax. Implantable loop recorder projecting over the lower left hemithorax. Lung volumes are low. Minimal subsegmental atelectasis in the right mid to lower lung, improved compared to the prior study. No consolidative airspace disease. No pleural effusions. No definite pneumothorax. No pulmonary nodule or mass noted. Pulmonary vasculature and the cardiomediastinal silhouette are within normal limits. IMPRESSION: 1. Support apparatus, as above. 2. Low lung volumes without radiographic evidence of acute cardiopulmonary disease. Previously noted small right-sided pneumothorax is not readily apparent on today's examination. Electronically Signed   By: Vinnie Langton M.D.   On: 04/10/2022 06:42   DG CHEST PORT 1 VIEW  Result Date: 04/09/2022 CLINICAL DATA:  Hypoxia. EXAM: PORTABLE CHEST 1 VIEW COMPARISON:  Chest x-ray 03/31/2022. FINDINGS: Endotracheal tube has been removed. There is a new tracheostomy with distal tip 3.1 cm above the carina. Enteric tube has been removed. The cardiomediastinal silhouette is within normal limits. There are new patchy and linear opacities in the right lung base. Costophrenic angles are clear. There is also a new right-sided pneumothorax measuring 2.5 cm from the lung apex. Left lung is clear. No acute fractures are  seen. IMPRESSION: 1. New small right pneumothorax. 2. New right basilar atelectasis. Electronically Signed   By: Ronney Asters M.D.   On: 04/09/2022 21:53      Assessment & Plan    71yo woman admitted with HTN, DM, CKD3b, prior CVA who was admitted with acute stroke. Hospital course significant for new trach and PEG. Course has been complicated by agitation, difficult to manage airway secretions and sinus pauses.  #Sinus pauses In setting of trach manipulation. TVP has been removed.  Maintain Zoll pads. If significant bradycardia, will need to replace temp wire and provide sedation to patient given degree of agitation this morning.     For questions or updates, please contact [place] Please consult [email or web address] for contact info under        Signed, Vickie Epley, MD  04/10/2022, 7:42 AM

## 2022-04-10 NOTE — Evaluation (Signed)
Physical Therapy Re-Evaluation Patient Details Name: [name] MRN: [id number] DOB: [birth year] Today's Date: 04/10/2022  History of Present Illness  Pt is a 71 year old woman admitted on 03/23/22 with dizziness, decreased balance and hoarse voice. + stroke L medulla. Patient coded and required CPR and intubation on 5/14. Trach placed 5/22. PEG placed 5/23. Temp pacer placed 5/25, but has since been removed. Pt back on vent 5/28 secondary to hypoxia and bradycardia and being unresponsive. PMH: CVA, DM@, CKDIII, PVD, HTN, hypothyroidism.   Clinical Impression  Pt now on vent after hypoxic and bradycardic event 5/28. Pt is now requiring maxAx2 to transition supine > sit and TA x2 to transition sit > supine and transfer to stand. Pt displays gross generalized weakness (L>R), decreased activity tolerance, poor midline alignment awareness, cognitive deficits, and balance deficits. Pt does show some personality and is able to follow one-step commands consistently though. Coordinated with RN for PT/OT to treat pt between doses of ativan to allow pt to actively participate in session. Will continue to follow acutely. Current recommendations remain appropriate.     Recommendations for follow up therapy are one component of a multi-disciplinary discharge planning process, led by the attending physician.  Recommendations may be updated based on patient status, additional functional criteria and insurance authorization.  Follow Up Recommendations Acute inpatient rehab (3hours/day)    Assistance Recommended at Discharge Frequent or constant Supervision/Assistance  Patient can return home with the following  Two people to help with walking and/or transfers;A lot of help with bathing/dressing/bathroom;Assist for transportation;Help with stairs or ramp for entrance;Assistance with cooking/housework;Direct supervision/assist for medications management;Direct supervision/assist for financial management     Equipment Recommendations Rolling walker (2 wheels);BSC/3in1;Wheelchair (measurements PT);Wheelchair cushion (measurements PT);Hospital bed;Other (comment) (hoyer lift; pending progress)  Recommendations for Other Services       Functional Status Assessment Patient has had a recent decline in their functional status and demonstrates the ability to make significant improvements in function in a reasonable and predictable amount of time.     Precautions / Restrictions Precautions Precautions: Fall Precaution Comments: trach on vent, PEG; wrist restraints and mittens Restrictions Weight Bearing Restrictions: No      Mobility  Bed Mobility Overal bed mobility: Needs Assistance Bed Mobility: Supine to Sit, Sit to Supine     Supine to sit: Max assist, +2 for physical assistance, +2 for safety/equipment, HOB elevated Sit to supine: Total assist, +2 for physical assistance, +2 for safety/equipment   General bed mobility comments: Pt assisting slightly with managing legs off R EOB when cued and inititiated activation of core to sit up with cues, maxAx2. TAx2 to manage trunk and legs back to supine.    Transfers Overall transfer level: Needs assistance Equipment used: 2 person hand held assist Transfers: Sit to/from Stand Sit to Stand: Total assist, +2 physical assistance, +2 safety/equipment           General transfer comment: Bil knee block and UE support provided along with boost using bed pad to stand from EOB, TAx2.    Ambulation/Gait               General Gait Details: unable  Stairs            Wheelchair Mobility    Modified Rankin (Stroke Patients Only) Modified Rankin (Stroke Patients Only) Pre-Morbid Rankin Score: No symptoms Modified Rankin: Severe disability     Balance Overall balance assessment: Needs assistance Sitting-balance support: Single extremity supported, Bilateral upper extremity supported, Feet supported  Sitting balance-Leahy  Scale: Poor Sitting balance - Comments: Pt with tendency to lean to L and posteriorly. Not appearing to be oriented to midline, needing repeated cues and assistance to return to midline with LOB bouts. When propped on either elbow, she could sustain static sitting with min guard for up to ~10-15 sec. When sitting upright, she could sustain static sitting balance with min guard for up to ~5 sec then would need min-maxA again. Postural control: Left lateral lean, Posterior lean Standing balance support: Bilateral upper extremity supported Standing balance-Leahy Scale: Zero Standing balance comment: TAx2 and bil knee block with bil UE support                             Pertinent Vitals/Pain Pain Assessment Pain Assessment: Faces Faces Pain Scale: Hurts a little bit Pain Location: grimacing on occasion with mobility Pain Descriptors / Indicators: Grimacing Pain Intervention(s): Monitored during session, Limited activity within patient's tolerance, Repositioned    Home Living Family/patient expects to be discharged to:: Private residence Living Arrangements: Alone Available Help at Discharge: Family;Available PRN/intermittently Type of Home: House Home Access: Stairs to enter Entrance Stairs-Rails: Right;Left;Can reach both Entrance Stairs-Number of Steps: 3+5   Home Layout: One level Home Equipment: Conservation officer, nature (2 wheels);Cane - single point      Prior Function Prior Level of Function : Independent/Modified Independent;Driving                     Hand Dominance   Dominant Hand: Right    Extremity/Trunk Assessment   Upper Extremity Assessment Upper Extremity Assessment: Defer to OT evaluation    Lower Extremity Assessment Lower Extremity Assessment: Generalized weakness (noted functionally, but able to lift both actively against gravity so gross MMT scores of >/= 3/5 bil)    Cervical / Trunk Assessment Cervical / Trunk Assessment: Normal   Communication   Communication: Expressive difficulties;Other (comment);Tracheostomy (on vent)  Cognition Arousal/Alertness: Awake/alert Behavior During Therapy: Restless, Impulsive Overall Cognitive Status: Difficult to assess Area of Impairment: Following commands, Safety/judgement, Awareness, Problem solving                       Following Commands: Follows one step commands consistently Safety/Judgement: Decreased awareness of deficits, Decreased awareness of safety Awareness: Intellectual Problem Solving: Slow processing, Decreased initiation, Difficulty sequencing, Requires verbal cues, Requires tactile cues General Comments: Pt with trach on vent. Follows simple one-step commands consistently. Appears to have some feistiness and smiled 1x during session. Was able to communicate that she wanted water and would gesture for yankauer for sputum. Poor initiation and sequencing of tasks though and little to no awareness of midline orientation with delayed and weak reactional strategies sitting EOB.        General Comments General comments (skin integrity, edema, etc.): RR in 30s at times    Exercises Other Exercises Other Exercises: Actively moving UEs and lower extremities against gravity when cued   Assessment/Plan    PT Assessment Patient needs continued PT services  PT Problem List Decreased strength;Decreased balance;Decreased mobility;Decreased cognition;Decreased knowledge of use of DME;Decreased safety awareness;Decreased knowledge of precautions;Cardiopulmonary status limiting activity;Decreased activity tolerance;Decreased coordination       PT Treatment Interventions DME instruction;Gait training;Stair training;Functional mobility training;Therapeutic activities;Therapeutic exercise;Balance training;Neuromuscular re-education;Cognitive remediation;Patient/family education    PT Goals (Current goals can be found in the Care Plan section)  Acute Rehab PT  Goals Patient Stated Goal: to have water  PT Goal Formulation: With patient Time For Goal Achievement: 04/24/22 Potential to Achieve Goals: Good    Frequency Min 4X/week     Co-evaluation PT/OT/SLP Co-Evaluation/Treatment: Yes Reason for Co-Treatment: Complexity of the patient's impairments (multi-system involvement);Necessary to address cognition/behavior during functional activity;For patient/therapist safety;To address functional/ADL transfers PT goals addressed during session: Mobility/safety with mobility;Balance         AM-PAC PT "6 Clicks" Mobility  Outcome Measure Help needed turning from your back to your side while in a flat bed without using bedrails?: A Lot Help needed moving from lying on your back to sitting on the side of a flat bed without using bedrails?: Total Help needed moving to and from a bed to a chair (including a wheelchair)?: Total Help needed standing up from a chair using your arms (e.g., wheelchair or bedside chair)?: Total Help needed to walk in hospital room?: Total Help needed climbing 3-5 steps with a railing? : Total 6 Click Score: 7    End of Session Equipment Utilized During Treatment: Oxygen Activity Tolerance: Patient limited by fatigue Patient left: in bed;with call bell/phone within reach;with bed alarm set;with restraints reapplied;with nursing/sitter in room Nurse Communication: Mobility status PT Visit Diagnosis: Other symptoms and signs involving the nervous system (R29.898);Dizziness and giddiness (R42);Hemiplegia and hemiparesis;Other abnormalities of gait and mobility (R26.89);Unsteadiness on feet (R26.81);Muscle weakness (generalized) (M62.81) Hemiplegia - Right/Left: Right Hemiplegia - dominant/non-dominant: Dominant Hemiplegia - caused by: Cerebral infarction    Time: 1424-1450 PT Time Calculation (min) (ACUTE ONLY): 26 min   Charges:   PT Evaluation $PT Re-evaluation: 1 Re-eval          Moishe Spice, PT, DPT Acute  Rehabilitation Services  Pager: [phone] Office: [place] 04/10/2022, 3:33 PM

## 2022-04-10 NOTE — Consult Note (Signed)
Palliative Care Consult Note                                  Date: 04/10/2022   Patient Name: [name]  DOB: Sep 27, 1951  MRN: [id number]  Age / Sex: 71 y.o., female  PCP: Buzzy Han, MD (Inactive) Referring Physician: Candee Furbish, MD  Reason for Consultation: Establishing goals of care  HPI/Patient Profile: 71 y.o. female  with past medical history of prior CVA, DM 2 (poorly controlled), chronic kidney disease stage IIIb, hypertension, and hyperlipidemia.  She presented to the emergency department on 03/23/2022 with weakness and dizziness.  She was found to have an acute left medullary stroke and was admitted to the hospitalist service with stroke team following.  5/14 - she went into cardiac arrest and was intubated likely secondary to aspiration event 5/19 - she had another brief cardiac arrest thought secondary to vagal response while administering an enema 5/20 - extubated, and reintubated for stridor 5/22 - trach 5/23 - PEG 5/24 - sinus bradycardia with pauses; started dopamine 5/25 - temporary pacemaker insertion 5/26-27 -tolerating trach collar 5/28 - pulled out pacemaker wires  Past Medical History:  Diagnosis Date   Anemia    Arteriosclerotic cardiovascular disease    CKD (chronic kidney disease) stage 3, GFR 30-59 ml/min (HCC)    Claudication in peripheral vascular disease ([place])    CVA (cerebral vascular accident) ([place])    2012 and 2019-affected left side + dysarthria 2/2 severe R ACA and MCA stenosis   Epistaxis 03/29/2022   GERD (gastroesophageal reflux disease)    Hyperlipidemia    declines statins   Hypertension    Hypothyroidism    Type 2 diabetes mellitus ([place]) 2000   Vitamin D deficiency     Subjective:   I have reviewed medical records including EPIC notes, labs and imaging,  and assessed the patient at bedside.  She is is currently maintaining a normal heart rate without the  temporary pacemaker.  She has been reevaluated by PT/OT.  She is able to follow commands and participate well in the therapy session.  She is able to sit on the edge of bed and even stand for a brief period.   I spoke with her daughter [name] by phone to discuss diagnosis, prognosis, GOC, EOL wishes, disposition, and options.  I introduced Palliative Medicine as specialized medical care for people living with serious illness. It focuses on providing relief from the symptoms and stress of a serious illness.   Created space and opportunity for patient to explore thoughts and feelings regarding current medical situation. Values and goals of care important to patient and family were attempted to be elicited.  Life Review: Moira has lived in the [place] area for most of her life.  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  She is divorced.  [name] is her only daughter.  Yazmyne did suffer a recent loss; her significant other of 20+ years passed away from cancer about 2 months ago.  [name] states her mother has not been the same since this happened and has seemed depressed.    Functional Status: Prior to admission, Janayia lived independently in her home in [place].  She enjoyed going shopping and watching TV.  [name] describes her as "mostly keeping to herself".  Family Understanding of Illness: [name] has a fairly good understanding of her mother's hospital course and  current medical condition.  Goals: To continue current interventions with hope for improvement.  [name] states that her mother would not want to live in a facility long-term.  Additional Discussion: We discussed Kenadee's hospital course and current medical condition in detail.  We discussed that she was admitted with a stroke, which had likely caused dysphagia.  We discussed that dysphagia can lead to aspiration events, which is likely what caused Bellami to go into cardiac arrest.  We discussed  that Jaquaya had not successfully weaned from the ventilator within a certain time frame, and therefore needed a tracheostomy as well as a PEG tube.  We discussed the ongoing issue of Reshonda's agitation.  She has been refusing care and recently pulled out her trach and the temporary pacing wires.  [name] states that her mother "does not want any of the stuff and just wants to pass away".  [name] shares that at some point during the hospitalization, her mother indicated she did not want to be resuscitated.  However, [name] did not agree with this and wanted her mother to be resuscitated when she went into cardiac arrest.  Discussed that sometimes when patients refuse care and try to remove supportive devices/equipment, it indicates they do not want to continue full scope treatment.  [name] shares that as an only child, it would be very difficult decision to let her mother go.  Unfortunately Brieanne does not currently have insight into her condition or capacity to make complex medical decisions for herself.  Discussed that hopefully Amoria's mental status will improve to the point where she can participate in goals of care discussions and communicate her preferences regarding treatment.  If Arnie is able to clearly communicate that she does not want to continue full scope treatment, then [name] states that she would support this decision.  Discussed that per medical team, there is a reasonable chance for recovery and they will push forward with full scope care for now. [name] fully agrees.  Reviewed plan to keep patient sedated until permanent pacemaker can be placed.  Discussed the importance of continued conversation with family and the medical team regarding overall plan of care and treatment options, ensuring decisions are within the context of the patients values and GOCs.   Review of Systems  Unable to perform ROS  Objective:   Primary Diagnoses: Present on Admission:  CVA (cerebral vascular accident)  ([place])  CAD (coronary artery disease)  Hypothyroidism  GERD (gastroesophageal reflux disease)  Normocytic anemia   Physical Exam Vitals reviewed.  Constitutional:      General: She is not in acute distress.    Appearance: She is ill-appearing.  Cardiovascular:     Rate and Rhythm: Normal rate.  Pulmonary:     Comments: Trach/vent Abdominal:     Comments: PEG tube  Neurological:     Motor: Weakness present.     Comments: Follows commands    Vital Signs:  BP (!) 133/57   Pulse 93   Temp 99 F (37.2 C) (Axillary)   Resp (!) 0   Ht '5\' 2"'$  (1.575 m)   Wt 73.1 kg   SpO2 100%   BMI 29.48 kg/m   Palliative Assessment/Data: PPS 30-40%     Assessment & Plan:   SUMMARY OF RECOMMENDATIONS   Full code full scope with ongoing discussion Daughter is hopeful for improvement PMT will continue to follow  Primary Decision Maker: Daughter/[name]  Symptom Management:  Per primary team  Prognosis:  Unable to determine  Discharge Planning:  To Be Determined    Thank you for allowing [place] to participate in the care of [place]   Signed by: Elie Confer, NP Palliative Medicine Team  Team Phone # [phone]  For individual providers, please see [facility]ION

## 2022-04-10 NOTE — Evaluation (Signed)
Occupational Therapy Re-Evaluation Patient Details Name: [name] MRN: [id number] DOB: May 11, 1951 Today's Date: 04/10/2022   History of Present Illness Pt is a 71 year old woman admitted on 03/23/22 with dizziness, decreased balance and hoarse voice. + stroke L medulla. Patient coded and required CPR and intubation on 5/14. Trach placed 5/22. PEG placed 5/23. Temp pacer placed 5/25, but has since been removed. Pt back on vent 5/28 secondary to hypoxia and bradycardia and being unresponsive. PMH: CVA, DM@, CKDIII, PVD, HTN, hypothyroidism.   Clinical Impression   Pt reevaluated after bradycardia and hypoxic event requiring intubation. Pt having had restlessness earlier today, seen between medication doses. Pt presents with increased weakness requiring 2 person max to total assist for all mobility and demonstrating poor sitting balance at EOB. Pt following simple commands, making request for water. Smiling x 1. Pt with disconjugate gaze. Requires total assist for ADLs.      Recommendations for follow up therapy are one component of a multi-disciplinary discharge planning process, led by the attending physician.  Recommendations may be updated based on patient status, additional functional criteria and insurance authorization.   Follow Up Recommendations  Acute inpatient rehab (3hours/day)    Assistance Recommended at Discharge Frequent or constant Supervision/Assistance  Patient can return home with the following Two people to help with walking and/or transfers;Two people to help with bathing/dressing/bathroom;Direct supervision/assist for medications management;Direct supervision/assist for financial management;Assist for transportation;Help with stairs or ramp for entrance;Assistance with feeding;Assistance with cooking/housework    Functional Status Assessment  Patient has had a recent decline in their functional status and demonstrates the ability to make significant improvements in  function in a reasonable and predictable amount of time.  Equipment Recommendations  Other (comment) (defer to next venue)    Recommendations for Other Services       Precautions / Restrictions Precautions Precautions: Fall Precaution Comments: trach on vent, PEG; wrist restraints and mittens Restrictions Weight Bearing Restrictions: No      Mobility Bed Mobility Overal bed mobility: Needs Assistance Bed Mobility: Supine to Sit, Sit to Supine     Supine to sit: Max assist, +2 for physical assistance, +2 for safety/equipment, HOB elevated Sit to supine: Total assist, +2 for physical assistance, +2 for safety/equipment   General bed mobility comments: Pt assisting slightly with managing legs off R EOB when cued and inititiated activation of core to sit up with cues, maxAx2. TAx2 to manage trunk and legs back to supine.    Transfers Overall transfer level: Needs assistance Equipment used: 2 person hand held assist Transfers: Sit to/from Stand Sit to Stand: Total assist, +2 physical assistance, +2 safety/equipment           General transfer comment: Bil knee block and UE support provided along with boost using bed pad to stand from EOB, TAx2.      Balance Overall balance assessment: Needs assistance Sitting-balance support: Single extremity supported, Bilateral upper extremity supported, Feet supported Sitting balance-Leahy Scale: Poor Sitting balance - Comments: Pt with tendency to lean to L and posteriorly. Not appearing to be oriented to midline, needing repeated cues and assistance to return to midline with LOB bouts. When propped on either elbow, she could sustain static sitting with min guard for up to ~10-15 sec. When sitting upright, she could sustain static sitting balance with min guard for up to ~5 sec then would need min-maxA again. Postural control: Left lateral lean, Posterior lean Standing balance support: Bilateral upper extremity supported Standing  balance-Leahy Scale:  Zero                             ADL either performed or assessed with clinical judgement   ADL                                         General ADL Comments: hand over hand assist to suction mouth     Vision Ability to See in Adequate Light: 1 Impaired Additional Comments: disconjugate gaze, likely diplopia     Perception     Praxis      Pertinent Vitals/Pain Pain Assessment Pain Assessment: Faces Faces Pain Scale: Hurts a little bit Pain Location: generlized Pain Descriptors / Indicators: Grimacing Pain Intervention(s): Monitored during session, Repositioned     Hand Dominance Right   Extremity/Trunk Assessment Upper Extremity Assessment Upper Extremity Assessment: Generalized weakness   Lower Extremity Assessment Lower Extremity Assessment: Generalized weakness (noted functionally, but able to lift both actively against gravity so gross MMT scores of >/= 3/5 bil)   Cervical / Trunk Assessment Cervical / Trunk Assessment: Other exceptions (weakness)   Communication Communication Communication: Expressive difficulties;Other (comment);Tracheostomy (on vent)   Cognition Arousal/Alertness: Awake/alert Behavior During Therapy: Restless, Impulsive Overall Cognitive Status: Difficult to assess Area of Impairment: Following commands, Safety/judgement, Awareness, Problem solving                       Following Commands: Follows one step commands consistently Safety/Judgement: Decreased awareness of deficits, Decreased awareness of safety     General Comments: Pt with trach on vent. Follows simple one-step commands consistently. Appears to have some feistiness and smiled 1x during session. Was able to communicate that she wanted water and would gesture for yankauer for sputum. Poor initiation and sequencing of tasks though and little to no awareness of midline orientation with delayed and weak reactional strategies  sitting EOB.     General Comments  RR in 30s at times    Exercises     Shoulder Instructions      Home Living Family/patient expects to be discharged to:: Private residence Living Arrangements: Alone Available Help at Discharge: Family;Available PRN/intermittently Type of Home: House Home Access: Stairs to enter [employer] of Steps: 3+5 Entrance Stairs-Rails: Right;Left;Can reach both Home Layout: One level     Bathroom Shower/Tub: Teacher, early years/pre: Handicapped height     Home Equipment: Conservation officer, nature (2 wheels);Kasandra Knudsen - single point      Lives With: Alone    Prior Functioning/Environment Prior Level of Function : Independent/Modified Independent;Driving                        OT Problem List: Impaired balance (sitting and/or standing);Decreased knowledge of use of DME or AE      OT Treatment/Interventions: Self-care/ADL training;DME and/or AE instruction;Patient/family education;Balance training;Therapeutic activities;Cognitive remediation/compensation;Neuromuscular education;Visual/perceptual remediation/compensation    OT Goals(Current goals can be found in the care plan section) Acute Rehab OT Goals OT Goal Formulation: Patient unable to participate in goal setting Time For Goal Achievement: 04/24/22 Potential to Achieve Goals: Good ADL Goals Pt Will Perform Eating: with min assist;sitting (when cleared for PO) Pt Will Perform Grooming: with min assist;sitting Pt Will Perform Upper Body Dressing: with mod assist;sitting Pt Will Transfer to Toilet: with mod assist;bedside commode;stand pivot transfer  Pt/caregiver will Perform Home Exercise Program: Both right and left upper extremity;With Supervision (AROM) Additional ADL Goal #1: Pt will demonstrate fair sitting balance as a precursor to ADLs. Additional ADL Goal #2: Pt will follow 2 step commands.  OT Frequency: Min 3X/week    Co-evaluation PT/OT/SLP  Co-Evaluation/Treatment: Yes Reason for Co-Treatment: Complexity of the patient's impairments (multi-system involvement) PT goals addressed during session: Mobility/safety with mobility;Balance OT goals addressed during session: Strengthening/ROM      AM-PAC OT "6 Clicks" Daily Activity     Outcome Measure Help from another person eating meals?: Total Help from another person taking care of personal grooming?: Total Help from another person toileting, which includes using toliet, bedpan, or urinal?: Total Help from another person bathing (including washing, rinsing, drying)?: Total Help from another person to put on and taking off regular upper body clothing?: Total Help from another person to put on and taking off regular lower body clothing?: Total 6 Click Score: 6   End of Session Nurse Communication: Mobility status  Activity Tolerance: Patient tolerated treatment well Patient left: in bed;with call bell/phone within reach;with bed alarm set;with restraints reapplied;with nursing/sitter in room  OT Visit Diagnosis: Unsteadiness on feet (R26.81);Dizziness and giddiness (R42)                Time: 1424-1450 OT Time Calculation (min): 26 min Charges:  OT General Charges $OT Visit: 1 Visit OT Evaluation $OT Re-eval: 1 Re-eval  Nestor Lewandowsky, OTR/L Acute Rehabilitation Services Pager: [phone] Office: [phone]   Malka So 04/10/2022, 3:48 PM

## 2022-04-10 NOTE — Progress Notes (Signed)
NAME:  [name], MRN:  [id number], DOB:  [birth year], LOS: 62 ADMISSION DATE:  03/23/2022, CONSULTATION DATE: 03/25/2022 REFERRING MD:  Jonetta Osgood, MD , CHIEF COMPLAINT: Increasing shortness of breath  History of Present Illness:  71 year old female with poorly controlled diabetes, hypertension, CKD stage IIIb and prior history of CVA who presented with vertigo and progressive dysphagia, she was noted to have left medullary acute stroke, was admitted to hospital service for further management, stroke team is following.  This afternoon patient started with increasing dysphagia and unable to protect her airway, she required frequent suctioning, she became hypoxic and her oxygen requirement went up, now she is on Ventimask, PCCM was consulted for help evaluation and management  Pertinent  Medical History   Past Medical History:  Diagnosis Date   Anemia    Arteriosclerotic cardiovascular disease    CKD (chronic kidney disease) stage 3, GFR 30-59 ml/min (HCC)    Claudication in peripheral vascular disease ([place])    CVA (cerebral vascular accident) ([place])    2012 and 2019-affected left side + dysarthria 2/2 severe R ACA and MCA stenosis   Epistaxis 03/29/2022   GERD (gastroesophageal reflux disease)    Hyperlipidemia    declines statins   Hypertension    Hypothyroidism    Type 2 diabetes mellitus ([place]) 2000   Vitamin D deficiency      Significant Hospital Events: Including procedures, antibiotic start and stop dates in addition to other pertinent events   5/11 admitted 5/14 intubated for cardiac arrest, likely 2/2 aspiration vs hypoxia, Unasyn started 5/17 awake. Following commands. Sig difficulty w/ glycemic control. Adjustments made to basal and ss dosing 5/18 awake. VTs low on PSV. Failed initial am SBT (sats 60s; reporting SOB). Got SMPG still no BM 5/19 pulmonary edema on cxr getting lasix. SSE for constipation.  Brief arrest thought 2/2 to vagal response while turning  for enema  5/20 extubated, reintubated for post extubation stridor 5/22 trach by CCM 5/23 IR PEG, trach color  5/24 sinus brady with pause. Started dopa. EP consult 5/25 temp pacemaker insertion 5/26-27 trach collar, downsized to 4-0 5/28- pulled out pacemaker, bradycardic, 6-0 shiley cuffed placed and now on vent, sedating  Interim History / Subjective:  Now on vent due to bradycardia yesterday presumed from hypoventilation?  Unclear.  Requiring sedation so she does not pull off more devices. Maintaining blood pressures.  Objective   Blood pressure (!) 144/69, pulse 100, temperature 99.4 F (37.4 C), temperature source Axillary, resp. rate (!) 23, height '5\' 2"'$  (1.575 m), weight 73.1 kg, SpO2 100 %.    Vent Mode: PRVC FiO2 (%):  [40 %-50 %] 50 % Set Rate:  [20 bmp-25 bmp] 25 bmp Vt Set:  [400 mL] 400 mL PEEP:  [5 cmH20] 5 cmH20 Plateau Pressure:  [10 cmH20-13 cmH20] 12 cmH20   Intake/Output Summary (Last 24 hours) at 04/10/2022 1017 Last data filed at 04/10/2022 0600 Gross per 24 hour  Intake 846.01 ml  Output 350 ml  Net 496.01 ml    Filed Weights   03/30/22 0500 04/07/22 0500 04/09/22 0500  Weight: 74.8 kg 73.1 kg 73.1 kg   Examination: Sedated on vent Ext warm R groin dressed without strikethrough (previous sheath site) Heart sounds tachycardic, no pauses during my exam Not following commands but this is volitional  Sugars up, insulin adjusted yesterday will see how settles out Cr still up slightly Anemia stable Maybe small PTX on R?  Hard to see on today's CXR  Resolved Hospital Problem list   Epistaxis Hyponatremia  Assessment & Plan:  Principal Problem:   CVA (cerebral vascular accident) ([place]) Active Problems:   Hypothyroidism   DM2 (diabetes mellitus, type 2) ([place])   HLD (hyperlipidemia)   HTN (hypertension)   Stage 3b chronic kidney disease (CKD) (HCC)   GERD (gastroesophageal reflux disease)   Normocytic anemia   Dysphagia due to recent stroke    CAD (coronary artery disease)   Acute respiratory failure (HCC)   Aspiration pneumonia (HCC)   Pulmonary edema   Sinus arrest   Acute respiratory failure with hypoxia, improved RLL aspiration PNA  Dysphagia from CVA Post extubation stridor Tracheostomy status  PEG status Bleeding from tracheostomy  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  Discussed with EP, going to keep sedated until she gets her permanent pacer.  Can hold off on TVP for now.  Keep close eye on tele. Vent support, VAP prevention bundle Sedation with ativan PRN for now.  Some of this is delirium but a lot of it is just poor insight.  Intermittent Symptomatic bradycardia / cardiac pause S/p asystolic cardiac arrest x2 due to severe bradycardia Plan will be for leadless pacer this week Hold brillinta until device in place  HTN- better today - Continue amlodipine  AKI on CKD 3b- improved - Monitor, avoid nephroxins  HLD - Continue crestor, gemfibrozil   Acute blood loss anemia on anemia of chronic disease from tracheal site bleeding  brillinta on hold until ppm placed   Hypothyroidism  Continue synthroid   DM2 with hyperglycemia- will see how settles out depending on her acceptance of insulin, should be better now that she is sedated  GOC- see discussion 5/28.  Keep patient sedated until PPM then I am pretty sure she can be decannulated and go to CIR vs. SNF.  Dr. Tacy Learn to take over starting tomorrow   Best Practice (right click and "Reselect all SmartList Selections" daily)   Diet/type: tubefeeds DVT prophylaxis: LMWH GI prophylaxis: H2B Lines: N/A Foley:  N/A Code Status:  full code Last date of multidisciplinary goals of care discussion [5/27, daughter updated)   32 min cc time Erskine Emery MD PCCM

## 2022-04-11 ENCOUNTER — Inpatient Hospital Stay (HOSPITAL_COMMUNITY): Payer: Medicare Other

## 2022-04-11 DIAGNOSIS — I639 Cerebral infarction, unspecified: Secondary | ICD-10-CM | POA: Diagnosis not present

## 2022-04-11 DIAGNOSIS — I455 Other specified heart block: Secondary | ICD-10-CM | POA: Diagnosis not present

## 2022-04-11 DIAGNOSIS — E1159 Type 2 diabetes mellitus with other circulatory complications: Secondary | ICD-10-CM | POA: Diagnosis not present

## 2022-04-11 DIAGNOSIS — J9601 Acute respiratory failure with hypoxia: Secondary | ICD-10-CM | POA: Diagnosis not present

## 2022-04-11 DIAGNOSIS — I63212 Cerebral infarction due to unspecified occlusion or stenosis of left vertebral arteries: Secondary | ICD-10-CM | POA: Diagnosis not present

## 2022-04-11 LAB — BASIC METABOLIC PANEL
Anion gap: 9 (ref 5–15)
BUN: 28 mg/dL — ABNORMAL HIGH (ref 8–23)
CO2: 26 mmol/L (ref 22–32)
Calcium: 8.5 mg/dL — ABNORMAL LOW (ref 8.9–10.3)
Chloride: 104 mmol/L (ref 98–111)
Creatinine, Ser: 1.31 mg/dL — ABNORMAL HIGH (ref 0.44–1.00)
GFR, Estimated: 44 mL/min — ABNORMAL LOW (ref >60)
Glucose, Bld: 239 mg/dL — ABNORMAL HIGH (ref 70–99)
Potassium: 4.2 mmol/L (ref 3.5–5.1)
Sodium: 139 mmol/L (ref 135–145)

## 2022-04-11 LAB — CBC
HCT: 22.9 % — ABNORMAL LOW (ref 36.0–46.0)
Hemoglobin: 7.2 g/dL — ABNORMAL LOW (ref 12.0–15.0)
MCH: 28.9 pg (ref 26.0–34.0)
MCHC: 31.4 g/dL (ref 30.0–36.0)
MCV: 92 fL (ref 80.0–100.0)
Platelets: 253 10*3/uL (ref 150–400)
RBC: 2.49 MIL/uL — ABNORMAL LOW (ref 3.87–5.11)
RDW: 14.3 % (ref 11.5–15.5)
WBC: 6.2 10*3/uL (ref 4.0–10.5)
nRBC: 0.5 % — ABNORMAL HIGH (ref 0.0–0.2)

## 2022-04-11 LAB — GLUCOSE, CAPILLARY
Glucose-Capillary: 218 mg/dL — ABNORMAL HIGH (ref 70–99)
Glucose-Capillary: 143 mg/dL — ABNORMAL HIGH (ref 70–99)
Glucose-Capillary: 119 mg/dL — ABNORMAL HIGH (ref 70–99)
Glucose-Capillary: 113 mg/dL — ABNORMAL HIGH (ref 70–99)
Glucose-Capillary: 78 mg/dL (ref 70–99)

## 2022-04-11 LAB — MAGNESIUM: Magnesium: 2.2 mg/dL (ref 1.7–2.4)

## 2022-04-11 MED ORDER — LOSARTAN POTASSIUM 50 MG PO TABS
50.0000 mg | ORAL_TABLET | Freq: Every day | ORAL | Status: DC
Start: 2022-04-12 — End: 2022-04-18
  Administered 2022-04-12 – 2022-04-18 (×7): 50 mg
  Filled 2022-04-11 (×8): qty 1

## 2022-04-11 NOTE — Progress Notes (Addendum)
Electrophysiology Rounding Note  Patient Name: [name] Date of Encounter: 04/11/2022  Primary Cardiologist: Yicel Shannon Meredith Leeds, MD Electrophysiologist: Dr. Curt Bears   Subjective   Awake on vent through trach but agitated.   Inpatient Medications    Scheduled Meds:  amLODipine  10 mg Per Tube Daily   aspirin  81 mg Per Tube Daily   chlorhexidine  15 mL Mouth Rinse BID   Chlorhexidine Gluconate Cloth  6 each Topical Daily   diclofenac  1 patch Transdermal BID   docusate  100 mg Per Tube BID   enoxaparin (LOVENOX) injection  40 mg Subcutaneous Q24H   famotidine  20 mg Per Tube Q24H   feeding supplement (PROSource TF)  45 mL Per Tube BID   gemfibrozil  600 mg Per Tube BID AC   insulin aspart  0-9 Units Subcutaneous Q4H   insulin aspart  8 Units Subcutaneous Q4H   insulin glargine-yfgn  35 Units Subcutaneous Daily   levothyroxine  88 mcg Per Tube Q0600   losartan  50 mg Oral Daily   mouth rinse  15 mL Mouth Rinse 10 times per day   polyethylene glycol  17 g Per Tube BID   rosuvastatin  10 mg Per Tube Daily   senna  2 tablet Per Tube BID   Continuous Infusions:  sodium chloride 20 mL/hr at 04/10/22 0600   sodium chloride Stopped (04/07/22 2043)   feeding supplement (JEVITY 1.5 CAL/FIBER) 1,000 mL (04/10/22 1236)   vancomycin Stopped (04/10/22 2137)   PRN Meds: sodium chloride, acetaminophen **OR** acetaminophen (TYLENOL) oral liquid 160 mg/5 mL **OR** acetaminophen, albuterol, hydrALAZINE, LORazepam, oxyCODONE, sodium chloride   Vital Signs    Vitals:   04/11/22 0401 04/11/22 0451 04/11/22 0500 04/11/22 0600  BP:   126/61 (!) 117/55  Pulse:   86 76  Resp:  20 13 (!) 0  Temp:      TempSrc:      SpO2: 100%  100% 100%  Weight:   73.9 kg   Height:        Intake/Output Summary (Last 24 hours) at 04/11/2022 0705 Last data filed at 04/11/2022 0600 Gross per 24 hour  Intake 1390 ml  Output 820 ml  Net 570 ml   Filed Weights   04/07/22 0500 04/09/22  0500 04/11/22 0500  Weight: 73.1 kg 73.1 kg 73.9 kg    Physical Exam    GEN- The patient is chronically ill appearing. Alert to person and voice. Soft restraints in place.  Head- normocephalic, atraumatic Eyes-  Sclera clear, conjunctiva pink Ears- hearing intact Oropharynx- clear Neck- supple Lungs- + mechanical breathing sounds.  Heart- Regular rate and rhythm, no murmurs, rubs or gallops GI- soft, NT, ND, + BS Extremities- no clubbing or cyanosis. No edema Skin- no rash or lesion Psych- Agitated, unable to talk over trach/vent.   Labs    CBC Recent Labs    04/10/22 0001 04/11/22 0244  WBC 9.3 6.2  NEUTROABS 7.6  --   HGB 7.5* 7.2*  HCT 24.7* 22.9*  MCV 93.6 92.0  PLT 297 379   Basic Metabolic Panel Recent Labs    04/10/22 0001 04/11/22 0244  NA 140 139  K 4.5 4.2  CL 103 104  CO2 27 26  GLUCOSE 325* 239*  BUN 23 28*  CREATININE 1.36* 1.31*  CALCIUM 8.9 8.5*  MG 2.3 2.2   Liver Function Tests Recent Labs    04/10/22 0001  AST 18  ALT 13  ALKPHOS  74  BILITOT 0.2*  PROT 6.7  ALBUMIN 2.4*   No results for input(s): LIPASE, AMYLASE in the last 72 hours. Cardiac Enzymes No results for input(s): CKTOTAL, CKMB, CKMBINDEX, TROPONINI in the last 72 hours.   Telemetry    NSR/sinus tach 80-100s (personally reviewed)  Radiology    DG CHEST PORT 1 VIEW  Result Date: 04/10/2022 CLINICAL DATA:  71 year old female with history of respiratory failure. EXAM: PORTABLE CHEST 1 VIEW COMPARISON:  Chest x-ray 04/09/2022. FINDINGS: A tracheostomy tube is in place with tip 5.4 cm above the carina. Transcutaneous defibrillator pad projecting over the lower left hemithorax. Implantable loop recorder projecting over the lower left hemithorax. Lung volumes are low. Minimal subsegmental atelectasis in the right mid to lower lung, improved compared to the prior study. No consolidative airspace disease. No pleural effusions. No definite pneumothorax. No pulmonary nodule or  mass noted. Pulmonary vasculature and the cardiomediastinal silhouette are within normal limits. IMPRESSION: 1. Support apparatus, as above. 2. Low lung volumes without radiographic evidence of acute cardiopulmonary disease. Previously noted small right-sided pneumothorax is not readily apparent on today's examination. Electronically Signed   By: Vinnie Langton M.D.   On: 04/10/2022 06:42   DG CHEST PORT 1 VIEW  Result Date: 04/09/2022 CLINICAL DATA:  Hypoxia. EXAM: PORTABLE CHEST 1 VIEW COMPARISON:  Chest x-ray 03/31/2022. FINDINGS: Endotracheal tube has been removed. There is a new tracheostomy with distal tip 3.1 cm above the carina. Enteric tube has been removed. The cardiomediastinal silhouette is within normal limits. There are new patchy and linear opacities in the right lung base. Costophrenic angles are clear. There is also a new right-sided pneumothorax measuring 2.5 cm from the lung apex. Left lung is clear. No acute fractures are seen. IMPRESSION: 1. New small right pneumothorax. 2. New right basilar atelectasis. Electronically Signed   By: Ronney Asters M.D.   On: 04/09/2022 21:53    Patient Profile     71 y.o. female with a hx of prior CVA 2012 and 2019 with left side weakness and dysarthria 2/2 severe R ACA and MCA stenosis, DM 2, hypertension, CKD 3B, CAD, PAD, and hyperlipidemia    She presented to ED 03/23/2022 for weakness and lightheadedness that started 5 days prior.  She was admitted for near syncope.  MRI brain with small acute to early subacute infarct in the left aspect of the medulla with white matter microangiopathy and small remote lacunar infarcts in the basal ganglia and right caudate head.  Neurology was consulted.  She also reported choking on food.  Core track was attempted but then removed for bleeding.  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  On 03/25/2022 she continued to choke, PCCM consulted.  left medulalry infarct likely secondary large vessel disease of  left V4 high grade stenosis vs. Short segmental occlusion   Echocardiogram 03/24/2022 revealed LVEF 60 to 33%, grade 1 diastolic dysfunction, normal RV, no significant valvular disease.   ON 03/26/2022 at approximately 12:45 PM CODE BLUE was called for PEA arrest following a violent coughing episode, patient stated she felt like she was choking, she desaturated with O2 readings in the 20% range, she became asystolic.  CPR was initiated and ROSC was achieved after 2 minutes of CPR with ACLS protocol.  She received 1 dose of epinephrine.  Post ROSC patient was intubated and placed on mechanical ventilation.  Telemetry reviewed and shows normal sinus rhythm just prior to arrest.  Arrest felt secondary to aspiration versus hypoxia.  She  remained intubated and failed SBT.  On 03/31/22 she arrested again with left decubitus postioning for enema - ROSC after 1 min of CPR followed by seizure like activity consistent with convulsive syncope.  She was extubated on 04/01/2022 but reintubated for stridor postextubation.   Bronchoscopy on 04/03/2022 performed for ETT placement and tracheostomy.  She underwent PEG placement 04/04/2022.  She completed Unasyn.  She was started on aspirin and Brilinta.  She is now receiving tube feeds and AKI has improved.   On 04/05/2022, telemetry showed intermittent bradycardia into the 20s and 30s.  1 pause of 3.44 seconds, several other pauses less than 3 seconds.  Cardiology was consulted.  Assessment & Plan    Intermittent sinus arrest:  Appears vagal though long pauses.  Post temp wire -> Pt pulled 5/27. Her rhythm has been stable post pulling the temp wire.  Tentatively planning Micra implant this week pending course, but may defer completely if rhythm remains stable.   CVA Delirium Per primary team   For questions or updates, please contact [place] Please consult [email or web address] for contact info under Cardiology/STEMI.  Signed, Shirley Friar, PA-C   04/11/2022, 7:05 AM   I have seen and examined this patient with Oda Kilts.  Agree with above, note added to reflect my findings.  Continues to have episodes of delirium.  No further pauses noted on monitor.  Temporary wire has been pulled out.  GEN: Well nourished, well developed, in no acute distress  HEENT: Tracheostomy in place Neck: no JVD, carotid bruits, or masses Cardiac: RRR; no murmurs, rubs, or gallops,no edema  Respiratory:  clear to auscultation bilaterally, normal work of breathing GI: soft, nontender, nondistended, + BS MS: no deformity or atrophy  Skin: warm and dry,  Neuro:  Strength and sensation are intact Psych: euthymic mood, full affect   Intermittent sinus arrest: Appears to be vagal.  Temporary wire was pulled 04/08/2022.  She has had no more episodes.  Due to that, we Dannilynn Gallina hold off on permanent pacemaker implant.  No further work-up for EP.  We Alece Koppel continue to monitor remotely on telemetry.  Zhoe Catania M. Michaelle Bottomley MD 04/11/2022 11:22 AM

## 2022-04-11 NOTE — Progress Notes (Signed)
NAME:  [name], MRN:  [id number], DOB:  1951/05/21, LOS: 9 ADMISSION DATE:  03/23/2022, CONSULTATION DATE: 03/25/2022 REFERRING MD:  Jonetta Osgood, MD , CHIEF COMPLAINT: Increasing shortness of breath  History of Present Illness:  71 year old female with poorly controlled diabetes, hypertension, CKD stage IIIb and prior history of CVA who presented with vertigo and progressive dysphagia, she was noted to have left medullary acute stroke, was admitted to hospital service for further management, stroke team is following.  This afternoon patient started with increasing dysphagia and unable to protect her airway, she required frequent suctioning, she became hypoxic and her oxygen requirement went up, now she is on Ventimask, PCCM was consulted for help evaluation and management  Pertinent  Medical History   Past Medical History:  Diagnosis Date   Anemia    Arteriosclerotic cardiovascular disease    CKD (chronic kidney disease) stage 3, GFR 30-59 ml/min (HCC)    Claudication in peripheral vascular disease ([place])    CVA (cerebral vascular accident) ([place])    2012 and 2019-affected left side + dysarthria 2/2 severe R ACA and MCA stenosis   Epistaxis 03/29/2022   GERD (gastroesophageal reflux disease)    Hyperlipidemia    declines statins   Hypertension    Hypothyroidism    Type 2 diabetes mellitus ([place]) 2000   Vitamin D deficiency      Significant Hospital Events: Including procedures, antibiotic start and stop dates in addition to other pertinent events   5/11 admitted 5/14 intubated for cardiac arrest, likely 2/2 aspiration vs hypoxia, Unasyn started 5/17 awake. Following commands. Sig difficulty w/ glycemic control. Adjustments made to basal and ss dosing 5/18 awake. VTs low on PSV. Failed initial am SBT (sats 60s; reporting SOB). Got SMPG still no BM 5/19 pulmonary edema on cxr getting lasix. SSE for constipation.  Brief arrest thought 2/2 to vagal response while turning  for enema  5/20 extubated, reintubated for post extubation stridor 5/22 trach by CCM 5/23 IR PEG, trach color  5/24 sinus brady with pause. Started dopa. EP consult 5/25 temp pacemaker insertion 5/26-27 trach collar, downsized to 4-0 5/28- pulled out pacemaker, bradycardic, 6-0 shiley cuffed placed and now on vent, sedating  Interim History / Subjective:  No new episodes of bradycardia Remains on full mechanical ventilatory support this morning she was placed on a spontaneous breathing trial, tolerating well so far Seen by EP, still recommend observation to see if patient will require permanent pacemaker  Remained afebrile Objective   Blood pressure (!) 105/49, pulse 90, temperature 98.4 F (36.9 C), temperature source Oral, resp. rate 20, height '5\' 2"'$  (1.575 m), weight 73.9 kg, SpO2 100 %.    Vent Mode: PSV;CPAP FiO2 (%):  [40 %] 40 % Set Rate:  [25 bmp] 25 bmp Vt Set:  [400 mL] 400 mL PEEP:  [5 cmH20] 5 cmH20 Pressure Support:  [8 cmH20] 8 cmH20 Plateau Pressure:  [10 cmH20-15 cmH20] 15 cmH20   Intake/Output Summary (Last 24 hours) at 04/11/2022 1101 Last data filed at 04/11/2022 0800 Gross per 24 hour  Intake 1390 ml  Output 1080 ml  Net 310 ml   Filed Weights   04/07/22 0500 04/09/22 0500 04/11/22 0500  Weight: 73.1 kg 73.1 kg 73.9 kg   Examination: Physical exam: General: Acute on chronically ill-appearing female, lying on the bed.  S/p trach HEENT: [place]/AT, eyes anicteric.  moist mucus membranes Neuro: Opens eyes with vocal stimuli, following simple commands, moving all 4 extremities  Chest: Coarse  breath sounds, no wheezes or rhonchi Heart: Regular rate and rhythm, no murmurs or gallops Abdomen: Soft, nontender, nondistended, bowel sounds present Skin: No rash   Resolved Hospital Problem list   Epistaxis Hyponatremia Bleeding from tracheostomy  AKI on CKD stage III  Assessment & Plan:  Principal Problem:   CVA (cerebral vascular accident) ([place]) Active  Problems:   Hypothyroidism   DM2 (diabetes mellitus, type 2) ([place])   HLD (hyperlipidemia)   HTN (hypertension)   Stage 3b chronic kidney disease (CKD) (HCC)   GERD (gastroesophageal reflux disease)   Normocytic anemia   Dysphagia due to recent stroke   CAD (coronary artery disease)   Acute respiratory failure (HCC)   Aspiration pneumonia (HCC)   Pulmonary edema   Sinus arrest   Acute respiratory failure with hypoxia, improved RLL aspiration PNA  Dysphagia from CVA Post extubation stridor S/p tracheostomy and PEG tube placement 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 Intermittent agitation/delrium Patient was placed back on ventilator due to bradycardia event happened 2 days ago No more episodes of bradycardia Tolerating spontaneous breathing trial, sedation was stopped We will try trach collar again Completed antibiotics treatment Continue routine trach and PEG care Continue tube feeds Continue aspirin and statin  Intermittent Symptomatic bradycardia / cardiac pause S/p asystolic cardiac arrest x2 due to severe bradycardia EP cardiology is following Still deciding if patient will need leadless permanent pacemaker Hold brillinta until device in place  HTN Blood pressure is better controlled Continue amlodipine  HLD Continue crestor, gemfibrozil   Acute blood loss anemia on anemia of chronic disease from tracheal site bleeding  brillinta on hold until ppm placed Bleeding has stopped, H&H remained stable   Hypothyroidism  Continue synthroid   Poorly controlled DM2 with hyperglycemia Hemoglobin A1c is 9.5 Continue insulin with CBG goal 140-180   GOC- see discussion 5/28.  Keep patient sedated until PPM then I am pretty sure she can be decannulated and go to CIR vs. SNF\   Best Practice (right click and "Reselect all SmartList Selections" daily)   Diet/type: tubefeeds DVT prophylaxis: LMWH GI prophylaxis: H2B Lines: N/A Foley:  N/A Code  Status:  full code Last date of multidisciplinary goals of care discussion [5/28, daughter updated)   Total critical care time: 33 minutes  Performed by: [place] care time was exclusive of separately billable procedures and treating other patients.   Critical care was necessary to treat or prevent imminent or life-threatening deterioration.   Critical care was time spent personally by me on the following activities: development of treatment plan with patient and/or surrogate as well as nursing, discussions with consultants, evaluation of patient's response to treatment, examination of patient, obtaining history from patient or surrogate, ordering and performing treatments and interventions, ordering and review of laboratory studies, ordering and review of radiographic studies, pulse oximetry and re-evaluation of patient's condition.   Jacky Kindle MD [facility] See Amion for pager If no response to pager, please call [phone] until 7pm After 7pm, Please call E-link [phone]

## 2022-04-11 NOTE — Plan of Care (Signed)
  Problem: Education: Goal: Knowledge of secondary prevention will improve (SELECT ALL) Outcome: Not Progressing Goal: Knowledge of patient specific risk factors will improve (INDIVIDUALIZE FOR PATIENT) Outcome: Not Progressing

## 2022-04-11 NOTE — Progress Notes (Signed)
Physical Therapy Treatment Patient Details Name: Leonora Gores MRN: [id number] DOB: 1950-12-28 Today's Date: 04/11/2022   History of Present Illness Pt is a 71 y.o. female admitted 03/23/22 with dizziness, balance issues, hoarse voice. 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 Code blue 5/14, pt requiring CPR and intubated. Trach placed 5/22. PEG placed 5/23. Temporary pacer placed 5/25; pt pulled out pacer 5/28; plan to hold on permanent pacemaker implant. Pt back on vent support 5/28 due to hypoxia, bradycardia. Coruse complicated by deliriuim. PMH includes HTN, DM, CKD 3, CVA.   PT Comments    Pt not progressing with mobility with increased AMS compared to yesterday's session. Pt restless/impulsive with minimal command following, requiring totalA+2-3 for bed mobility, dependent for ADL tasks. Pt opening eyes to max verbal cues, but not attending to therapist with gaze. Deferred OOB maxisky transfer secondary to restlessness and AMS. Of note, SpO2 >/98% on RA. Pt remains limited by generalized weakness, decreased activity tolerance, and impaired balance strategies/postural reactions. Continue to recommend post-acute rehab pending activity tolerance for AIR vs. SNF-level therapies to maximize functional mobility and independence.     Recommendations for follow up therapy are one component of a multi-disciplinary discharge planning process, led by the attending physician.  Recommendations may be updated based on patient status, additional functional criteria and insurance authorization.  Follow Up Recommendations  Acute inpatient rehab (3hours/day) - pending progress     Assistance Recommended at Discharge Frequent or constant Supervision/Assistance  Patient can return home with the following Two people to help with walking and/or transfers;A lot of help with bathing/dressing/bathroom;Assist for transportation;Help with stairs or ramp for entrance;Assistance with cooking/housework;Direct  supervision/assist for medications management;Direct supervision/assist for financial management   Equipment Recommendations  TBD - Rolling walker (2 wheels);BSC/3in1;Wheelchair (measurements PT);Wheelchair cushion (measurements PT);Hospital bed;Hoyer lift   Recommendations for Other Services       Precautions / Restrictions Precautions Precautions: Fall;Other (comment) Precaution Comments: trach on vent, PEG; wrist restraints, mittens Restrictions Weight Bearing Restrictions: No     Mobility  Bed Mobility Overal bed mobility: Needs Assistance Bed Mobility: Rolling Rolling: Total assist, +2 for physical assistance, +2 for safety/equipment         General bed mobility comments: TotalA+2 for multiple rolls R/L for linen change and washup, +3 assist to manage RUEs due to restlessless/pulling at lines    Transfers                   General transfer comment: deferred maxisky lift to recliner secondary to pt's restlessness and pulling at lines; RN to replace wrist restraints    Ambulation/Gait                   Stairs             Wheelchair Mobility    Modified Rankin (Stroke Patients Only) Modified Rankin (Stroke Patients Only) Pre-Morbid Rankin Score: No symptoms Modified Rankin: Severe disability     Balance                                            Cognition Arousal/Alertness: Lethargic, Awake/alert Behavior During Therapy: Restless, Impulsive Overall Cognitive Status: Difficult to assess                                 General Comments: decreased  communication and purposeful movement this session; pt restless/impulsive with BUE movement, keeping eyes closed majority of session but would open to max verbal cues, able to minimally redirect; when eyes open, pt not attending to therapist with gaze, leftward gaze with very brief bouts of midline gaze        Exercises Other Exercises Other Exercises:  observed active BUE/BLE movement against gravity, but pt not doing so to cues; good BLE hip/knee ROM with PROM    General Comments General comments (skin integrity, edema, etc.): pt frequently removing O2 trach collar, SpO2 maintaining >/98% on RA (RN aware); RN/NT present to assist      Pertinent Vitals/Pain Pain Assessment Pain Assessment: Faces Faces Pain Scale: Hurts little more Pain Location: generalized Pain Descriptors / Indicators: Grimacing, Restless Pain Intervention(s): Monitored during session, Repositioned    Home Living                          Prior Function            PT Goals (current goals can now be found in the care plan section) Progress towards PT goals: Not progressing toward goals - comment (AMS)    Frequency    Min 4X/week      PT Plan Current plan remains appropriate    Co-evaluation              AM-PAC PT "6 Clicks" Mobility   Outcome Measure  Help needed turning from your back to your side while in a flat bed without using bedrails?: Total Help needed moving from lying on your back to sitting on the side of a flat bed without using bedrails?: Total Help needed moving to and from a bed to a chair (including a wheelchair)?: Total Help needed standing up from a chair using your arms (e.g., wheelchair or bedside chair)?: Total Help needed to walk in hospital room?: Total Help needed climbing 3-5 steps with a railing? : Total 6 Click Score: 6    End of Session   Activity Tolerance: Treatment limited secondary to agitation;Other (comment) (decreased cognition) Patient left: in bed;with call bell/phone within reach;with restraints reapplied;with nursing/sitter in room Nurse Communication: Mobility status;Need for lift equipment PT Visit Diagnosis: Other symptoms and signs involving the nervous system (R29.898);Dizziness and giddiness (R42);Hemiplegia and hemiparesis;Other abnormalities of gait and mobility (R26.89);Unsteadiness  on feet (R26.81);Muscle weakness (generalized) (M62.81) Hemiplegia - Right/Left: Right Hemiplegia - dominant/non-dominant: Dominant Hemiplegia - caused by: Cerebral infarction     Time: 1324-4010 PT Time Calculation (min) (ACUTE ONLY): 24 min  Charges:  $Therapeutic Activity: 8-22 mins                     Mabeline Caras, PT, DPT Acute Rehabilitation Services  Pager [phone] Office [place] 04/11/2022, 12:25 PM

## 2022-04-11 NOTE — Progress Notes (Signed)
[facility] Progress Note Patient Name: [name] DOB: [birth year] MRN: [id number]   Date of Service  04/11/2022  HPI/Events of Note  Agitation - Nursing request to renew restraint orders.   eICU Interventions  Will renew bilateral soft wrist restraints X 11 hours.     Intervention Category Major Interventions: Delirium, psychosis, severe agitation - evaluation and management  Keona Bilyeu [place] 04/11/2022, 10:50 PM

## 2022-04-12 ENCOUNTER — Encounter (HOSPITAL_COMMUNITY): Payer: Self-pay | Admitting: Anesthesiology

## 2022-04-12 ENCOUNTER — Encounter (HOSPITAL_COMMUNITY)
Admission: EM | Disposition: A | Discharge status: Nursing Home (Includes ICF) | Payer: Self-pay | Source: Home / Self Care | Attending: Internal Medicine

## 2022-04-12 DIAGNOSIS — I10 Essential (primary) hypertension: Secondary | ICD-10-CM | POA: Diagnosis not present

## 2022-04-12 DIAGNOSIS — J690 Pneumonitis due to inhalation of food and vomit: Secondary | ICD-10-CM | POA: Diagnosis not present

## 2022-04-12 DIAGNOSIS — I455 Other specified heart block: Secondary | ICD-10-CM | POA: Diagnosis not present

## 2022-04-12 DIAGNOSIS — J9601 Acute respiratory failure with hypoxia: Secondary | ICD-10-CM | POA: Diagnosis not present

## 2022-04-12 LAB — BASIC METABOLIC PANEL
Anion gap: 7 (ref 5–15)
BUN: 23 mg/dL (ref 8–23)
CO2: 28 mmol/L (ref 22–32)
Calcium: 8.7 mg/dL — ABNORMAL LOW (ref 8.9–10.3)
Chloride: 104 mmol/L (ref 98–111)
Creatinine, Ser: 1.23 mg/dL — ABNORMAL HIGH (ref 0.44–1.00)
GFR, Estimated: 47 mL/min — ABNORMAL LOW (ref >60)
Glucose, Bld: 110 mg/dL — ABNORMAL HIGH (ref 70–99)
Potassium: 4.3 mmol/L (ref 3.5–5.1)
Sodium: 139 mmol/L (ref 135–145)

## 2022-04-12 LAB — CBC
HCT: 22.1 % — ABNORMAL LOW (ref 36.0–46.0)
Hemoglobin: 7.1 g/dL — ABNORMAL LOW (ref 12.0–15.0)
MCH: 28.9 pg (ref 26.0–34.0)
MCHC: 32.1 g/dL (ref 30.0–36.0)
MCV: 89.8 fL (ref 80.0–100.0)
Platelets: 287 10*3/uL (ref 150–400)
RBC: 2.46 MIL/uL — ABNORMAL LOW (ref 3.87–5.11)
RDW: 14.4 % (ref 11.5–15.5)
WBC: 5.6 10*3/uL (ref 4.0–10.5)
nRBC: 0 % (ref 0.0–0.2)

## 2022-04-12 LAB — GLUCOSE, CAPILLARY
Glucose-Capillary: 112 mg/dL — ABNORMAL HIGH (ref 70–99)
Glucose-Capillary: 117 mg/dL — ABNORMAL HIGH (ref 70–99)
Glucose-Capillary: 124 mg/dL — ABNORMAL HIGH (ref 70–99)
Glucose-Capillary: 127 mg/dL — ABNORMAL HIGH (ref 70–99)
Glucose-Capillary: 228 mg/dL — ABNORMAL HIGH (ref 70–99)
Glucose-Capillary: 89 mg/dL (ref 70–99)
Glucose-Capillary: 91 mg/dL (ref 70–99)

## 2022-04-12 LAB — MAGNESIUM: Magnesium: 2.2 mg/dL (ref 1.7–2.4)

## 2022-04-12 SURGERY — PACEMAKER LEADLESS INSERTION
Anesthesia: General

## 2022-04-12 NOTE — Progress Notes (Signed)
Speech Language Pathology Treatment: Dysphagia;Passy Muir Speaking valve  Patient Details Name: [name] MRN: [id number] DOB: 1951-06-26 Today's Date: 04/12/2022 Time: 9390-3009 SLP Time Calculation (min) (ACUTE ONLY): 18 min  Assessment / Plan / Recommendation Clinical Impression  Pt was lethargic but able to stay awake with intermittent verbal cues for communication and dysphagia treatment. Sats noted 90-91% on arrival and pt coughing copious secretions from trach prior to cuff deflation and continued briefly after cuff deflated. Able to adequately clear secretions, valve donned and sats slowly increased to 95-97%. Pt mouthed words but unable to attain phonation today as respiratory support significantly diminished. Compensatory strategies did not facilitate initiation of vocalization. All vitals stable.  She accepted teaspoon amount of nectar thick grape juice with delayed throat clear, cough as evidence of aspiration with grape/purple tinged secretions via trach. Swallow initiation was delayed and weak on observation. 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    HPI HPI: Pt is a 71 y.o. female who presenting with weakness and lightheadedness. MRI 5/11: Small acute to early subacute infarct in the left aspect of the  medulla. Pt found tohave severe neuromuscular dysphagia after MBS. Pt then intubated 5/14-5/22 and trached.  PMH: CVA, DM2, HTN, CKD3b, HLD.      SLP Plan  Continue with current plan of care      Recommendations for follow up therapy are one component of a multi-disciplinary discharge planning process, led by the attending physician.  Recommendations may be updated based on patient status, additional functional criteria and insurance authorization.    Recommendations  Diet recommendations: NPO Medication Administration: Via  alternative means      Patient may use Passy-Muir Speech Valve: During all therapies with supervision PMSV Supervision: Full MD: Please consider changing trach tube to : Cuffless (when weaned from vent)         Oral Care Recommendations: Oral care QID Follow Up Recommendations: Skilled nursing-short term rehab (<3 hours/day) Assistance recommended at discharge: Frequent or constant Supervision/Assistance SLP Visit Diagnosis: Dysphagia, unspecified (R13.10);[place] (R49.1) Plan: Continue with current plan of care           [name]  04/12/2022, 9:07 AM

## 2022-04-12 NOTE — Progress Notes (Signed)
NAME:  [name], MRN:  [id number], DOB:  11-06-1951, LOS: 36 ADMISSION DATE:  03/23/2022, CONSULTATION DATE: 03/25/2022 REFERRING MD:  Jonetta Osgood, MD , CHIEF COMPLAINT: Increasing shortness of breath  History of Present Illness:  71 year old female with poorly controlled diabetes, hypertension, CKD stage IIIb and prior history of CVA who presented with vertigo and progressive dysphagia, she was noted to have left medullary acute stroke, was admitted to hospital service for further management, stroke team is following.  This afternoon patient started with increasing dysphagia and unable to protect her airway, she required frequent suctioning, she became hypoxic and her oxygen requirement went up, now she is on Ventimask, PCCM was consulted for help evaluation and management  Pertinent  Medical History   Past Medical History:  Diagnosis Date   Anemia    Arteriosclerotic cardiovascular disease    CKD (chronic kidney disease) stage 3, GFR 30-59 ml/min (HCC)    Claudication in peripheral vascular disease ([place])    CVA (cerebral vascular accident) ([place])    2012 and 2019-affected left side + dysarthria 2/2 severe R ACA and MCA stenosis   Epistaxis 03/29/2022   GERD (gastroesophageal reflux disease)    Hyperlipidemia    declines statins   Hypertension    Hypothyroidism    Type 2 diabetes mellitus ([place]) 2000   Vitamin D deficiency      Significant Hospital Events: Including procedures, antibiotic start and stop dates in addition to other pertinent events   5/11 admitted 5/14 intubated for cardiac arrest, likely 2/2 aspiration vs hypoxia, Unasyn started 5/17 awake. Following commands. Sig difficulty w/ glycemic control. Adjustments made to basal and ss dosing 5/18 awake. VTs low on PSV. Failed initial am SBT (sats 60s; reporting SOB). Got SMPG still no BM 5/19 pulmonary edema on cxr getting lasix. SSE for constipation.  Brief arrest thought 2/2 to vagal response while turning  for enema  5/20 extubated, reintubated for post extubation stridor 5/22 trach by CCM 5/23 IR PEG, trach color  5/24 sinus brady with pause. Started dopa. EP consult 5/25 temp pacemaker insertion 5/26-27 trach collar, downsized to 4-0 5/28- pulled out pacemaker, bradycardic, 6-0 shiley cuffed placed and now on vent, sedating  Interim History / Subjective:  Patient had 1 episode of severe bradycardia after few minutes of suctioning with 4-second pause She was agitated overnight then became hypoxic, placed on full mechanical ventilatory support She is tolerating trach collar this morning Objective   Blood pressure (!) 121/52, pulse 81, temperature 98.6 F (37 C), temperature source Oral, resp. rate 20, height '5\' 2"'$  (1.575 m), weight 72.3 kg, SpO2 100 %.    Vent Mode: PRVC FiO2 (%):  [40 %] 40 % Set Rate:  [25 bmp] 25 bmp Vt Set:  [400 mL] 400 mL PEEP:  [5 cmH20] 5 cmH20 Plateau Pressure:  [14 cmH20-16 cmH20] 16 cmH20   Intake/Output Summary (Last 24 hours) at 04/12/2022 1110 Last data filed at 04/12/2022 0400 Gross per 24 hour  Intake 50 ml  Output 850 ml  Net -800 ml   Filed Weights   04/09/22 0500 04/11/22 0500 04/12/22 0500  Weight: 73.1 kg 73.9 kg 72.3 kg   Examination: Physical exam: General: Acute on chronically ill-appearing female, lying on the bed.  S/p trach HEENT: [place]/AT, eyes anicteric.  moist mucus membranes Neuro: Opens eyes with vocal stimuli, following simple commands, moving all 4 extremities  Chest: Coarse breath sounds, no wheezes or rhonchi Heart: Regular rate and rhythm, no murmurs or  gallops Abdomen: Soft, nontender, nondistended, bowel sounds present Skin: No rash   Resolved Hospital Problem list   Epistaxis Hyponatremia Bleeding from tracheostomy  AKI on CKD stage III  Assessment & Plan:  Principal Problem:   CVA (cerebral vascular accident) ([place]) Active Problems:   Hypothyroidism   DM2 (diabetes mellitus, type 2) (HCC)   HLD  (hyperlipidemia)   HTN (hypertension)   Stage 3b chronic kidney disease (CKD) (HCC)   GERD (gastroesophageal reflux disease)   Normocytic anemia   Dysphagia due to recent stroke   CAD (coronary artery disease)   Acute respiratory failure (HCC)   Aspiration pneumonia (HCC)   Pulmonary edema   Sinus arrest   Acute respiratory failure with hypoxia, improved RLL aspiration PNA  Dysphagia from CVA Post extubation stridor S/p tracheostomy and PEG tube placement 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 Intermittent agitation/delrium Patient was placed back on ventilator overnight due to agitation This morning she is tolerating trach collar again Continue routine trach and PEG care Continue tube feeds Continue aspirin and statin  Intermittent Symptomatic bradycardia / cardiac pause S/p asystolic cardiac arrest x2 due to severe bradycardia EP cardiology is following Had an episode of severe bradycardia and 4-second pause yesterday after few minutes of trach suctioning EP recommended holding off on leadless pacemaker placement Continue telemetry monitoring and close observation  HTN Blood pressure is better controlled Continue amlodipine  HLD Continue crestor, gemfibrozil   Acute blood loss anemia on anemia of chronic disease from tracheal site bleeding  brillinta on hold for now, will resume before discharge Bleeding has stopped, H&H remained stable   Hypothyroidism  Continue synthroid   Poorly controlled DM2 with hyperglycemia Hemoglobin A1c is 9.5 Continue insulin with CBG goal 140-180     Best Practice (right click and "Reselect all SmartList Selections" daily)   Diet/type: tubefeeds DVT prophylaxis: LMWH GI prophylaxis: H2B Lines: N/A Foley:  N/A Code Status:  full code Last date of multidisciplinary goals of care discussion [5/31, daughter updated over the phone)    Jacky Kindle MD [facility] See Amion for pager If  no response to pager, please call [phone] until 7pm After 7pm, Please call E-link [phone]

## 2022-04-12 NOTE — Progress Notes (Signed)
Physical Therapy Treatment Patient Details Name: [name] MRN: [id number] DOB: [birth year] Today's Date: 04/12/2022   History of Present Illness Pt is a 71 y.o. female admitted 03/23/22 with dizziness, balance issues, hoarse voice. 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 Code blue 5/14, pt requiring CPR and intubated. Trach placed 5/22. PEG placed 5/23. Temporary pacer placed 5/25; pt pulled out pacer 5/28; plan to hold on permanent pacemaker implant. Pt back on vent support 5/28 due to hypoxia, bradycardia. Course complicated by deliriuim. PMH includes HTN, DM, CKD 3, CVA.   PT Comments    Pt progressing with mobility, motivated and participatory in OOB activity. Today's session focused on transfer training and standing tolerance from recliner; pt requiring modA+1-2 for standing with stedy. Pt demonstrates L-lateral lean with difficulty correcting despite multimodal cues, note that pt will spontaneously correct though. Pt following majority of simple commands and better able to make needs known; clearly fatigued by end of session. Pt remains limited by generalized weakness, decreased activity tolerance, poor balance strategies/postural reactions and impaired cognition. Continue to recommend intensive AIR-level therapies to maximize functional mobility and independence prior to return home.    Recommendations for follow up therapy are one component of a multi-disciplinary discharge planning process, led by the attending physician.  Recommendations may be updated based on patient status, additional functional criteria and insurance authorization.  Follow Up Recommendations  Acute inpatient rehab (3hours/day)     Assistance Recommended at Discharge Frequent or constant Supervision/Assistance  Patient can return home with the following Two people to help with walking and/or transfers;A lot of help with bathing/dressing/bathroom;Assist for transportation;Help with stairs or ramp for  entrance;Assistance with cooking/housework;Direct supervision/assist for medications management;Direct supervision/assist for financial management   Equipment Recommendations  Rolling walker (2 wheels);BSC/3in1;Wheelchair (measurements PT);Wheelchair cushion (measurements PT);Hospital bed;Hoyer lift   Recommendations for Other Services       Precautions / Restrictions Precautions Precautions: Fall;Other (comment) Precaution Comments: trach on vent, PEG Restrictions Weight Bearing Restrictions: No     Mobility  Bed Mobility               General bed mobility comments: received sitting in recliner    Transfers Overall transfer level: Needs assistance Equipment used: Ambulation equipment used (stedy) Transfers: Sit to/from Stand Sit to Stand: Mod assist, +2 physical assistance, +2 safety/equipment           General transfer comment: pt able to stand from low recliner height into stedy frame with modA+2 for trnk elevation (pt automatically placing feet on foot board and hands on handle to pull without cues), L lateral lean with difficulty correcting despite multimodal cues; pt performing multiple sit<>stands from stedy seat with min guard to modA for trunk elevation and stability    Ambulation/Gait                   Stairs             Wheelchair Mobility    Modified Rankin (Stroke Patients Only) Modified Rankin (Stroke Patients Only) Pre-Morbid Rankin Score: No symptoms Modified Rankin: Severe disability     Balance Overall balance assessment: Needs assistance Sitting-balance support: Single extremity supported, Bilateral upper extremity supported, Feet supported Sitting balance-Leahy Scale: Poor Sitting balance - Comments: pt with difficulty correcting L lean via verbal/tactile cues, but 2x automatically correcting without cues/assist     Standing balance-Leahy Scale: Poor Standing balance comment: reliant on UE support, L lateral lean; pt  "hugging" RN on R-side in order to achieve  midline posture, able to maintain midline briefly after this                            Cognition Arousal/Alertness: Awake/alert Behavior During Therapy: Restless, Impulsive Overall Cognitive Status: Difficult to assess Area of Impairment: Attention, Following commands, Safety/judgement, Awareness, Problem solving                   Current Attention Level: Sustained   Following Commands: Follows one step commands consistently, Follows multi-step commands inconsistently Safety/Judgement: Decreased awareness of deficits Awareness: Intellectual Problem Solving: Difficulty sequencing, Requires verbal cues, Requires tactile cues General Comments: pt following majority of simple commands appropriately; no verbalizations (PMSV not placed due to significant secretions), but attempting to mouth some words and making needs known (i.e. blanket, water). pt with difficulty correcting L lateral lean to cues, but able to spontaneously correct at times. increased drooling noted with suspected fatigue. pt adamantly shaking head and mouthing "no" at end of session when asked to stand again, clearly fatigued        Exercises      General Comments        Pertinent Vitals/Pain Pain Assessment Pain Assessment: Faces Faces Pain Scale: No hurt Pain Intervention(s): Monitored during session    Home Living                          Prior Function            PT Goals (current goals can now be found in the care plan section) Progress towards PT goals: Progressing toward goals    Frequency    Min 4X/week      PT Plan Current plan remains appropriate    Co-evaluation              AM-PAC PT "6 Clicks" Mobility   Outcome Measure  Help needed turning from your back to your side while in a flat bed without using bedrails?: A Lot Help needed moving from lying on your back to sitting on the side of a flat bed without  using bedrails?: A Lot Help needed moving to and from a bed to a chair (including a wheelchair)?: Total Help needed standing up from a chair using your arms (e.g., wheelchair or bedside chair)?: Total Help needed to walk in hospital room?: Total Help needed climbing 3-5 steps with a railing? : Total 6 Click Score: 8    End of Session Equipment Utilized During Treatment: Oxygen;Gait belt Activity Tolerance: Patient tolerated treatment well Patient left: in chair;with call bell/phone within reach;with chair alarm set;with nursing/sitter in room Nurse Communication: Mobility status;Need for lift equipment PT Visit Diagnosis: Other symptoms and signs involving the nervous system (R29.898);Dizziness and giddiness (R42);Hemiplegia and hemiparesis;Other abnormalities of gait and mobility (R26.89);Unsteadiness on feet (R26.81);Muscle weakness (generalized) (M62.81) Hemiplegia - dominant/non-dominant: Dominant Hemiplegia - caused by: Cerebral infarction     Time: 5400-8676 PT Time Calculation (min) (ACUTE ONLY): 21 min  Charges:  $Therapeutic Activity: 8-22 mins                     Mabeline Caras, PT, DPT Acute Rehabilitation Services  Pager [phone] Office [place] 04/12/2022, 12:56 PM

## 2022-04-12 NOTE — Progress Notes (Signed)
Nutrition Follow-up  DOCUMENTATION CODES:   Not applicable  INTERVENTION:   Tube Feeding via PEG: Transition to Bolus Feedings Jevity 1.5 formula: 5 cartons per day 0800: 2 cartons (474 mL) Jevity 1.5 1400: 2 cartons (474 mL) of Jevity 1.5  2000: 1 carton (237 mL) of Jevity 1.5  Pro-Source TF 45 mL BID  Bolus regimen provides 1855 kcals, 98 g of protein and 900 mL of free water  Pt requires additional free water of 200 mL q 6 hours to meet hydration needs; total free water 1700 mL  Discussed change to bolus feedings with Attending MD; suggested changing scheduled q 4 hours insulin coverage to "meal" coverage to administer with each bolus   NUTRITION DIAGNOSIS:   Inadequate oral intake related to inability to eat as evidenced by NPO status.  Being addressed via TF   GOAL:   Patient will meet greater than or equal to 90% of their needs  Progressing  MONITOR:   Diet advancement, Labs, Weight trends, TF tolerance  REASON FOR ASSESSMENT:   Consult Enteral/tube feeding initiation and management  ASSESSMENT:   71 y.o. female presented to the ED with weakness and lightheadedness. PMH includes CVA, T2DM, HTN, CKD IIIb, and GERD. Pt admitted with CVA.  5/11 admitted 5/12 s/p cortrak placement later removed due to nose bleed 5/13 tx to ICU 5/14 developed respiratory distress due to aspiration and required emergent intubation.   5/22 s/p trach and PEG  Tolerating trach collar  Pt was able to work with SLP with intermittent verbal cues yesterday; pt mouthing words but phonation not achieved with PMV due to respiratory status. Remains NPO with very high aspiration risk   Tolerating Jevity 1.5 at 50 ml/hr with Pro-Source TF 45 mL BID.   Current wt 72. Kg; admit weight 74.9 kg  Labs: reviewed Meds: colace, ss novolog, novolog q 4 hours, semglee   Diet Order:   Diet Order             Diet NPO time specified  Diet effective now                   EDUCATION  NEEDS:   No education needs have been identified at this time  Skin:  Skin Assessment: Reviewed RN Assessment  Last BM:  5/30  Height:   Ht Readings from Last 1 Encounters:  04/09/22 '5\' 2"'$  (1.575 m)    Weight:   Wt Readings from Last 1 Encounters:  04/12/22 72.3 kg    Ideal Body Weight:  50 kg  BMI:  Body mass index is 29.15 kg/m.  Estimated Nutritional Needs:   Kcal:  1800-2000  Protein:  90-105 grams  Fluid:  >/= 1.8 L   Kerman Passey MS, RDN, LDN, CNSC Registered Dietitian III Clinical Nutrition RD Pager and On-Call Pager Number Located in [place]

## 2022-04-12 NOTE — Progress Notes (Signed)
Occupational Therapy Treatment Patient Details Name: [name] MRN: [id number] DOB: [birth year] Today's Date: 04/12/2022   History of present illness Pt is a 71 y.o. female admitted 03/23/22 with dizziness, balance issues, hoarse voice. 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 Code blue 5/14, pt requiring CPR and intubated. Trach placed 5/22. PEG placed 5/23. Temporary pacer placed 5/25; pt pulled out pacer 5/28; plan to hold on permanent pacemaker implant. Pt back on vent support 5/28 due to hypoxia, bradycardia. Coruse complicated by deliriuim. PMH includes HTN, DM, CKD 3, CVA.   OT comments  Patient with increased LOA and ability to participate this session.  Patient able to squat pivot with Mod A, initial +2 to maintain EOB sitting.  Once up to supported sitting with bolster to decrease L lean, able to participate with seated grooming, but continues to need Max A for ADL completion. Light there-x to L UE performed with good follow through.  OT to continue efforts in the acute setting with AIR recommended for post acute rehab prior to returning home.      Recommendations for follow up therapy are one component of a multi-disciplinary discharge planning process, led by the attending physician.  Recommendations may be updated based on patient status, additional functional criteria and insurance authorization.    Follow Up Recommendations  Acute inpatient rehab (3hours/day)    Assistance Recommended at Discharge Frequent or constant Supervision/Assistance  Patient can return home with the following  Two people to help with walking and/or transfers;Two people to help with bathing/dressing/bathroom;Direct supervision/assist for medications management;Direct supervision/assist for financial management;Assist for transportation;Help with stairs or ramp for entrance;Assistance with feeding;Assistance with cooking/housework   Equipment Recommendations       Recommendations for Other Services       Precautions / Restrictions Precautions Precautions: Fall;Other (comment) Precaution Comments: trach on vent, PEG; wrist restraints, mittens Restrictions Weight Bearing Restrictions: No       Mobility Bed Mobility Overal bed mobility: Needs Assistance Bed Mobility: Supine to Sit     Supine to sit: Max assist, HOB elevated          Transfers Overall transfer level: Needs assistance   Transfers: Bed to chair/wheelchair/BSC, Sit to/from Stand Sit to Stand: Mod assist, +2 physical assistance Stand pivot transfers: Mod assist               Balance Overall balance assessment: Needs assistance Sitting-balance support: Single extremity supported, Bilateral upper extremity supported, Feet supported Sitting balance-Leahy Scale: Poor   Postural control: Left lateral lean, Posterior lean Standing balance support: Bilateral upper extremity supported Standing balance-Leahy Scale: Poor Standing balance comment: able to stand RW with brief periods of Min Guard, but continue to lean to L                           ADL either performed or assessed with clinical judgement   ADL   Eating/Feeding: NPO               Upper Body Dressing : Moderate assistance;Sitting   Lower Body Dressing: Maximal assistance;+2 for physical assistance Lower Body Dressing Details (indicate cue type and reason): heavy lean to L in sit and stand. Toilet Transfer: Moderate assistance;Stand-pivot;BSC/3in1                  Extremity/Trunk Assessment Upper Extremity Assessment RUE Deficits / Details: still weak however full ROM and using functionally for gromming and mobility.  Grossly 3+/5 RUE Coordination:  decreased gross motor LUE Deficits / Details: weaker than R, impaired coordination but improving.  Can bring hand to mouth, but under/overshoots. LUE Coordination: decreased fine motor;decreased gross motor   Lower Extremity Assessment Lower Extremity Assessment: Defer  to PT evaluation        Vision Ability to See in Adequate Light: 1 Impaired Vision Assessment?: Vision impaired- to be further tested in functional context Additional Comments: continues with disconjugate gaze - L eye with lateral deviation more than R, but nods no to ? of double vision.   Perception Perception Perception: Within Functional Limits   Praxis Praxis Praxis-Other Comments: weakness and incoordination - isn't presenting with motor planning, but continue to monitor.    Cognition Arousal/Alertness: Awake/alert Behavior During Therapy: Impulsive Overall Cognitive Status: Impaired/Different from baseline                     Current Attention Level: Sustained   Following Commands: Follows one step commands consistently   Awareness: Intellectual Problem Solving: Difficulty sequencing, Requires verbal cues, Requires tactile cues                       General Comments  VSS on RA    Pertinent Vitals/ Pain       Pain Assessment Pain Assessment: Faces Faces Pain Scale: No hurt Pain Intervention(s): Monitored during session                                                          Frequency  Min 3X/week        Progress Toward Goals  OT Goals(current goals can now be found in the care plan section)  Progress towards OT goals: Progressing toward goals  Acute Rehab OT Goals Time For Goal Achievement: 04/24/22 Potential to Achieve Goals: Good  Plan Discharge plan remains appropriate    Co-evaluation                 AM-PAC OT "6 Clicks" Daily Activity     Outcome Measure   Help from another person eating meals?: Total Help from another person taking care of personal grooming?: A Lot Help from another person toileting, which includes using toliet, bedpan, or urinal?: A Lot Help from another person bathing (including washing, rinsing, drying)?: A Lot Help from another person to put on and taking off regular  upper body clothing?: A Lot Help from another person to put on and taking off regular lower body clothing?: A Lot 6 Click Score: 11    End of Session Equipment Utilized During Treatment: Gait belt;Rolling walker (2 wheels)  OT Visit Diagnosis: Unsteadiness on feet (R26.81);Dizziness and giddiness (R42)   Activity Tolerance Patient tolerated treatment well   Patient Left in chair;with call bell/phone within reach;with restraints reapplied   Nurse Communication Mobility status        Time: 1110-1130 OT Time Calculation (min): 20 min  Charges: OT General Charges $OT Visit: 1 Visit OT Treatments $Therapeutic Activity: 8-22 mins  04/12/2022  RP, OTR/L  Acute Rehabilitation Services  Office:  [phone]   Metta Clines 04/12/2022, 12:38 PM

## 2022-04-12 NOTE — Progress Notes (Addendum)
Electrophysiology Rounding Note  Patient Name: [name] Date of Encounter: 04/12/2022  Primary Cardiologist: Constance Haw, MD   Subjective   Increased O2 demand overnight requiring reconnecting vent. Increased agitation overnight, now more somnolent    Inpatient Medications    Scheduled Meds:  amLODipine  10 mg Per Tube Daily   aspirin  81 mg Per Tube Daily   chlorhexidine  15 mL Mouth Rinse BID   Chlorhexidine Gluconate Cloth  6 each Topical Daily   diclofenac  1 patch Transdermal BID   docusate  100 mg Per Tube BID   enoxaparin (LOVENOX) injection  40 mg Subcutaneous Q24H   famotidine  20 mg Per Tube Q24H   feeding supplement (PROSource TF)  45 mL Per Tube BID   gemfibrozil  600 mg Per Tube BID AC   insulin aspart  0-9 Units Subcutaneous Q4H   insulin aspart  8 Units Subcutaneous Q4H   insulin glargine-yfgn  35 Units Subcutaneous Daily   levothyroxine  88 mcg Per Tube Q0600   losartan  50 mg Per Tube Daily   mouth rinse  15 mL Mouth Rinse 10 times per day   polyethylene glycol  17 g Per Tube BID   rosuvastatin  10 mg Per Tube Daily   senna  2 tablet Per Tube BID   Continuous Infusions:  sodium chloride 20 mL/hr at 04/10/22 0600   sodium chloride Stopped (04/07/22 2043)   feeding supplement (JEVITY 1.5 CAL/FIBER) 1,000 mL (04/10/22 1236)   vancomycin 750 mg (04/11/22 1953)   PRN Meds: sodium chloride, acetaminophen **OR** acetaminophen (TYLENOL) oral liquid 160 mg/5 mL **OR** acetaminophen, albuterol, hydrALAZINE, LORazepam, oxyCODONE, sodium chloride   Vital Signs    Vitals:   04/12/22 0400 04/12/22 0417 04/12/22 0500 04/12/22 0754  BP: (!) 126/58  (!) 130/58   Pulse: 76  78 75  Resp: 18  18 (!) 21  Temp: 98.6 F (37 C)     TempSrc: Oral     SpO2: 100% 100% 100% 95%  Weight:   72.3 kg   Height:        Intake/Output Summary (Last 24 hours) at 04/12/2022 0816 Last data filed at 04/12/2022 0400 Gross per 24 hour  Intake 50 ml  Output 850  ml  Net -800 ml   Filed Weights   04/09/22 0500 04/11/22 0500 04/12/22 0500  Weight: 73.1 kg 73.9 kg 72.3 kg    Physical Exam    GEN- The patient is on vent via trach. Intermittently responds to voice.  HEENT- + ET tube.  Lungs- +mechanical breathing sounds.  Heart- Regular rate and rhythm, no murmurs, rubs or gallops GI- soft Extremities- no clubbing or cyanosis. No edema Skin- no rash or lesion Neuro- On vent with delirium.  Labs    CBC Recent Labs    04/10/22 0001 04/11/22 0244 04/12/22 0543  WBC 9.3 6.2 5.6  NEUTROABS 7.6  --   --   HGB 7.5* 7.2* 7.1*  HCT 24.7* 22.9* 22.1*  MCV 93.6 92.0 89.8  PLT 297 253 825   Basic Metabolic Panel Recent Labs    04/11/22 0244 04/12/22 0543  NA 139 139  K 4.2 4.3  CL 104 104  CO2 26 28  GLUCOSE 239* 110*  BUN 28* 23  CREATININE 1.31* 1.23*  CALCIUM 8.5* 8.7*  MG 2.2 2.2   Liver Function Tests Recent Labs    04/10/22 0001  AST 18  ALT 13  ALKPHOS 74  BILITOT  0.2*  PROT 6.7  ALBUMIN 2.4*   No results for input(s): LIPASE, AMYLASE in the last 72 hours. Cardiac Enzymes No results for input(s): CKTOTAL, CKMB, CKMBINDEX, TROPONINI in the last 72 hours.   Telemetry    NSR 70s (personally reviewed)  Radiology    DG Chest Port 1 View  Result Date: 04/11/2022 CLINICAL DATA:  Encounter for tracheostomy care. EXAM: PORTABLE CHEST 1 VIEW COMPARISON:  Portable chest yesterday at 5:52 a.m. FINDINGS: 5:13 a.m., 04/11/2022. Tracheostomy cannula tip is 3.7 cm from the carina. Translucent electrical pad superimposes over the left lower chest. There is a loop recorder device in the left chest wall. The cardiac size is normal. There is calcification in the aortic arch. No vascular congestion is seen. There are perihilar atelectatic bands again noted. No focal pneumonia is seen. Inspiration is somewhat low.  No pleural effusion is evident. IMPRESSION: No evidence of acute chest disease.  Low lung volumes. Electronically Signed    By: Telford Nab M.D.   On: 04/11/2022 07:35    Patient Profile     71 y.o. female with a hx of prior CVA 2012 and 2019 with left side weakness and dysarthria 2/2 severe R ACA and MCA stenosis, DM 2, hypertension, CKD 3B, CAD, PAD, and hyperlipidemia    She presented to ED 03/23/2022 for weakness and lightheadedness that started 5 days prior.  She was admitted for near syncope.  MRI brain with small acute to early subacute infarct in the left aspect of the medulla with white matter microangiopathy and small remote lacunar infarcts in the basal ganglia and right caudate head.  Neurology was consulted.  She also reported choking on food.  Core track was attempted but then removed for bleeding.  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  On 03/25/2022 she continued to choke, PCCM consulted.  left medulalry infarct likely secondary large vessel disease of left V4 high grade stenosis vs. Short segmental occlusion   Echocardiogram 03/24/2022 revealed LVEF 60 to 96%, grade 1 diastolic dysfunction, normal RV, no significant valvular disease.   ON 03/26/2022 at approximately 12:45 PM CODE BLUE was called for PEA arrest following a violent coughing episode, patient stated she felt like she was choking, she desaturated with O2 readings in the 20% range, she became asystolic.  CPR was initiated and ROSC was achieved after 2 minutes of CPR with ACLS protocol.  She received 1 dose of epinephrine.  Post ROSC patient was intubated and placed on mechanical ventilation.  Telemetry reviewed and shows normal sinus rhythm just prior to arrest.  Arrest felt secondary to aspiration versus hypoxia.  She remained intubated and failed SBT.  On 03/31/22 she arrested again with left decubitus postioning for enema - ROSC after 1 min of CPR followed by seizure like activity consistent with convulsive syncope.  She was extubated on 04/01/2022 but reintubated for stridor postextubation.   Bronchoscopy on 04/03/2022 performed for ETT  placement and tracheostomy.  She underwent PEG placement 04/04/2022.  She completed Unasyn.  She was started on aspirin and Brilinta.  She is now receiving tube feeds and AKI has improved.   On 04/05/2022, telemetry showed intermittent bradycardia into the 20s and 30s.  1 pause of 3.44 seconds, several other pauses less than 3 seconds.  Cardiology was consulted.  Assessment & Plan    Intermittent sinus arrest:  Appears vagal though long pauses.  Post temp wire -> Pt pulled 5/27. Her rhythm has been stable post pulling the temp wire.  Brief bradycardia yesterday afternoon in setting of trach suctioning with concurrent R-R and P-P prolongation.  Her intermittent bradycardia is not currently contributing to her overall condition and at this current juncture risk of leadless pacing outweighs potential benefit.  We Kardell Virgil continue to follow along for course.    CVA Delirium Per primary team   4. Acute hypoxic respiratory failure Failed vent wean and placed back on last night.  Increased somnolence this am   For questions or updates, please contact [place] Please consult [email or web address] for contact info under Cardiology/STEMI.  Signed, Shirley Friar, PA-C  04/12/2022, 8:16 AM   I have seen and examined this patient with Oda Kilts.  Agree with above, note added to reflect my findings.  Patient with increased oxygen demand and reconnected to the vent overnight.  Increased agitation, more somnolent this morning.  GEN: Well nourished, well developed, in no acute distress  HEENT: normal  Neck: no JVD, carotid bruits, or masses Cardiac: RRR; no murmurs, rubs, or gallops,no edema  Respiratory:  clear to auscultation bilaterally, normal work of breathing GI: soft, nontender, nondistended, + BS MS: no deformity or atrophy  Skin: warm and dry Neuro:  Strength and sensation are intact Psych: euthymic mood, full affect   Sinus pauses: Appears to be due to vagal events.  She had 1  event yesterday that occurred immediately after trach suctioning.  She has not had any events over the last few days aside from that 1.  We [facility] hold off on leadless pacemaker for now.  We Aquilla Shambley continue to monitor.  Lukasz Rogus M. Deandre Stansel MD 04/12/2022 10:26 AM

## 2022-04-12 NOTE — Progress Notes (Signed)
Placed patient back on vent due to desaturation.

## 2022-04-13 DIAGNOSIS — K219 Gastro-esophageal reflux disease without esophagitis: Secondary | ICD-10-CM

## 2022-04-13 LAB — GLUCOSE, CAPILLARY
Glucose-Capillary: 109 mg/dL — ABNORMAL HIGH (ref 70–99)
Glucose-Capillary: 123 mg/dL — ABNORMAL HIGH (ref 70–99)
Glucose-Capillary: 153 mg/dL — ABNORMAL HIGH (ref 70–99)
Glucose-Capillary: 171 mg/dL — ABNORMAL HIGH (ref 70–99)
Glucose-Capillary: 197 mg/dL — ABNORMAL HIGH (ref 70–99)
Glucose-Capillary: 202 mg/dL — ABNORMAL HIGH (ref 70–99)
Glucose-Capillary: 202 mg/dL — ABNORMAL HIGH (ref 70–99)
Glucose-Capillary: 66 mg/dL — ABNORMAL LOW (ref 70–99)

## 2022-04-13 LAB — CBC
HCT: 23.9 % — ABNORMAL LOW (ref 36.0–46.0)
Hemoglobin: 7.4 g/dL — ABNORMAL LOW (ref 12.0–15.0)
MCH: 28.4 pg (ref 26.0–34.0)
MCHC: 31 g/dL (ref 30.0–36.0)
MCV: 91.6 fL (ref 80.0–100.0)
Platelets: 290 10*3/uL (ref 150–400)
RBC: 2.61 MIL/uL — ABNORMAL LOW (ref 3.87–5.11)
RDW: 14.3 % (ref 11.5–15.5)
WBC: 4.9 10*3/uL (ref 4.0–10.5)
nRBC: 0 % (ref 0.0–0.2)

## 2022-04-13 LAB — BASIC METABOLIC PANEL
Anion gap: 8 (ref 5–15)
BUN: 24 mg/dL — ABNORMAL HIGH (ref 8–23)
CO2: 26 mmol/L (ref 22–32)
Calcium: 8.9 mg/dL (ref 8.9–10.3)
Chloride: 104 mmol/L (ref 98–111)
Creatinine, Ser: 1.21 mg/dL — ABNORMAL HIGH (ref 0.44–1.00)
GFR, Estimated: 48 mL/min — ABNORMAL LOW (ref >60)
Glucose, Bld: 175 mg/dL — ABNORMAL HIGH (ref 70–99)
Potassium: 4.1 mmol/L (ref 3.5–5.1)
Sodium: 138 mmol/L (ref 135–145)

## 2022-04-13 LAB — MAGNESIUM: Magnesium: 2.3 mg/dL (ref 1.7–2.4)

## 2022-04-13 MED ORDER — JEVITY 1.5 CAL/FIBER PO LIQD
237.0000 mL | Freq: Every day | ORAL | Status: DC
  Administered 2022-04-13 – 2022-04-17 (×5): 237 mL
  Filled 2022-04-13 (×6): qty 237

## 2022-04-13 MED ORDER — DEXTROSE 50 % IV SOLN
25.0000 mL | Freq: Once | INTRAVENOUS | Status: AC
  Administered 2022-04-13: 25 mL via INTRAVENOUS

## 2022-04-13 MED ORDER — INSULIN ASPART 100 UNIT/ML IJ SOLN
0.0000 [IU] | Freq: Three times a day (TID) | INTRAMUSCULAR | Status: DC
  Administered 2022-04-13: 2 [IU] via SUBCUTANEOUS
  Administered 2022-04-14: 5 [IU] via SUBCUTANEOUS
  Administered 2022-04-15 – 2022-04-16 (×3): 3 [IU] via SUBCUTANEOUS

## 2022-04-13 MED ORDER — FREE WATER
200.0000 mL | Freq: Four times a day (QID) | Status: DC
  Administered 2022-04-13 – 2022-04-18 (×20): 200 mL

## 2022-04-13 MED ORDER — INSULIN ASPART 100 UNIT/ML IJ SOLN
8.0000 [IU] | Freq: Three times a day (TID) | INTRAMUSCULAR | Status: DC
  Administered 2022-04-13 – 2022-04-16 (×10): 8 [IU] via SUBCUTANEOUS

## 2022-04-13 MED ORDER — DEXTROSE 50 % IV SOLN
INTRAVENOUS | Status: AC
  Filled 2022-04-13: qty 50

## 2022-04-13 MED ORDER — JEVITY 1.5 CAL/FIBER PO LIQD
474.0000 mL | Freq: Two times a day (BID) | ORAL | Status: DC
  Administered 2022-04-14 – 2022-04-18 (×9): 474 mL
  Filled 2022-04-13 (×2): qty 474
  Filled 2022-04-13: qty 1000
  Filled 2022-04-13 (×4): qty 474
  Filled 2022-04-13: qty 1000
  Filled 2022-04-13 (×5): qty 474

## 2022-04-13 NOTE — Progress Notes (Signed)
Report called to R.N. on 2 Heart.

## 2022-04-13 NOTE — Progress Notes (Signed)
Patient transferred to 2 Heart room 20 . With [place] R.N. and belongings . In bed with o2 and heart monitor. Patient tol. Well. No complaints.

## 2022-04-13 NOTE — Progress Notes (Addendum)
Electrophysiology Rounding Note  Patient Name: [name] Date of Encounter: 04/13/2022  Primary Cardiologist: Aalyssa Elderkin Meredith Leeds, MD   Subjective   Wakes to voice. NAEO  Inpatient Medications    Scheduled Meds:  amLODipine  10 mg Per Tube Daily   aspirin  81 mg Per Tube Daily   chlorhexidine  15 mL Mouth Rinse BID   Chlorhexidine Gluconate Cloth  6 each Topical Daily   diclofenac  1 patch Transdermal BID   docusate  100 mg Per Tube BID   enoxaparin (LOVENOX) injection  40 mg Subcutaneous Q24H   famotidine  20 mg Per Tube Q24H   feeding supplement (PROSource TF)  45 mL Per Tube BID   gemfibrozil  600 mg Per Tube BID AC   insulin aspart  0-9 Units Subcutaneous Q4H   insulin aspart  8 Units Subcutaneous Q4H   insulin glargine-yfgn  35 Units Subcutaneous Daily   levothyroxine  88 mcg Per Tube Q0600   losartan  50 mg Per Tube Daily   mouth rinse  15 mL Mouth Rinse 10 times per day   polyethylene glycol  17 g Per Tube BID   rosuvastatin  10 mg Per Tube Daily   senna  2 tablet Per Tube BID   Continuous Infusions:  sodium chloride 20 mL/hr at 04/10/22 0600   sodium chloride Stopped (04/07/22 2043)   feeding supplement (JEVITY 1.5 CAL/FIBER) 1,000 mL (04/12/22 1152)   PRN Meds: sodium chloride, acetaminophen **OR** acetaminophen (TYLENOL) oral liquid 160 mg/5 mL **OR** acetaminophen, albuterol, hydrALAZINE, LORazepam, oxyCODONE, sodium chloride   Vital Signs    Vitals:   04/13/22 0500 04/13/22 0600 04/13/22 0753 04/13/22 0758  BP: (!) 126/49 (!) 134/54    Pulse: 79 85 90   Resp: (!) 30 (!) 45 (!) 22   Temp:    98.5 F (36.9 C)  TempSrc:    Oral  SpO2: 96% 96% 95%   Weight: 73.3 kg     Height:        Intake/Output Summary (Last 24 hours) at 04/13/2022 0814 Last data filed at 04/13/2022 0600 Gross per 24 hour  Intake 700 ml  Output 800 ml  Net -100 ml   Filed Weights   04/11/22 0500 04/12/22 0500 04/13/22 0500  Weight: 73.9 kg 72.3 kg 73.3 kg     Physical Exam    GEN- The patient is on vent via trach. Awakens to voice.  HEENT - trach.  Lungs- Diminished Heart- Regular rate and rhythm, no murmurs, rubs or gallops GI- soft Extremities- no clubbing or cyanosis. No edema Skin- no rash or lesion Neuro- Wakens to voice   Labs    CBC Recent Labs    04/12/22 0543 04/13/22 0108  WBC 5.6 4.9  HGB 7.1* 7.4*  HCT 22.1* 23.9*  MCV 89.8 91.6  PLT 287 161   Basic Metabolic Panel Recent Labs    04/12/22 0543 04/13/22 0108  NA 139 138  K 4.3 4.1  CL 104 104  CO2 28 26  GLUCOSE 110* 175*  BUN 23 24*  CREATININE 1.23* 1.21*  CALCIUM 8.7* 8.9  MG 2.2 2.3   Liver Function Tests No results for input(s): AST, ALT, ALKPHOS, BILITOT, PROT, ALBUMIN in the last 72 hours. No results for input(s): LIPASE, AMYLASE in the last 72 hours. Cardiac Enzymes No results for input(s): CKTOTAL, CKMB, CKMBINDEX, TROPONINI in the last 72 hours.   Telemetry    NSR 70s with rare PVCs but no further  pauses (personally reviewed)  Radiology    No results found.  Patient Profile     71 y.o. female with a hx of prior CVA 2012 and 2019 with left side weakness and dysarthria 2/2 severe R ACA and MCA stenosis, DM 2, hypertension, CKD 3B, CAD, PAD, and hyperlipidemia    She presented to ED 03/23/2022 for weakness and lightheadedness that started 5 days prior.  She was admitted for near syncope.  MRI brain with small acute to early subacute infarct in the left aspect of the medulla with white matter microangiopathy and small remote lacunar infarcts in the basal ganglia and right caudate head.  Neurology was consulted.  She also reported choking on food.  Core track was attempted but then removed for bleeding.  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  On 03/25/2022 she continued to choke, PCCM consulted.  left medulalry infarct likely secondary large vessel disease of left V4 high grade stenosis vs. Short segmental occlusion    Echocardiogram 03/24/2022 revealed LVEF 60 to 06%, grade 1 diastolic dysfunction, normal RV, no significant valvular disease.   ON 03/26/2022 at approximately 12:45 PM CODE BLUE was called for PEA arrest following a violent coughing episode, patient stated she felt like she was choking, she desaturated with O2 readings in the 20% range, she became asystolic.  CPR was initiated and ROSC was achieved after 2 minutes of CPR with ACLS protocol.  She received 1 dose of epinephrine.  Post ROSC patient was intubated and placed on mechanical ventilation.  Telemetry reviewed and shows normal sinus rhythm just prior to arrest.  Arrest felt secondary to aspiration versus hypoxia.  She remained intubated and failed SBT.  On 03/31/22 she arrested again with left decubitus postioning for enema - ROSC after 1 min of CPR followed by seizure like activity consistent with convulsive syncope.  She was extubated on 04/01/2022 but reintubated for stridor postextubation.   Bronchoscopy on 04/03/2022 performed for ETT placement and tracheostomy.  She underwent PEG placement 04/04/2022.  She completed Unasyn.  She was started on aspirin and Brilinta.  She is now receiving tube feeds and AKI has improved.   On 04/05/2022, telemetry showed intermittent bradycardia into the 20s and 30s.  1 pause of 3.44 seconds, several other pauses less than 3 seconds.  Cardiology was consulted.  Assessment & Plan    Intermittent sinus arrest:  Appears vagal though long pauses.  Post temp wire -> Pt pulled 5/27. Her rhythm has been stable post pulling the temp wire.  Brief bradycardia 5/30 in setting of trach suctioning with concurrent R-R and P-P prolongation.  She has not had further unprovoked bradycardia, and worked with PT/OT yesterday without bradycardia.  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    CVA Delirium Per primary team    4. Acute hypoxic respiratory failure Per primary team. Intermittently  requiring vent via trach.   5. Anemia Hgb 7.1 -> 7.4 Per primary.   For questions or updates, please contact [place] Please consult [email or web address] for contact info under Cardiology/STEMI.  Signed, Shirley Friar, PA-C  04/13/2022, 8:14 AM   I have seen and examined this patient with Oda Kilts.  Agree with above, note added to reflect my findings.  Sleeping, wakes to voice.  No acute distress.  GEN: Well nourished, well developed, in no acute distress  HEENT: normal  Neck: no JVD, carotid bruits, or masses Cardiac: RRR; no murmurs, rubs, or gallops,no edema  Respiratory:  clear to auscultation bilaterally, normal work of breathing GI: soft, nontender, nondistended, + BS MS: no deformity or atrophy  Skin: warm and dry Neuro:  Strength and sensation are intact Psych: euthymic mood, full affect   Intermittent sinus arrest: Appears to be vagally mediated.  She is fortunately not had any spontaneous sinus pauses over the last few days.  We Jayziah Bankhead continue to hold off on pacemaker implant.  Benny Henrie M. Bert Ptacek MD 04/13/2022 10:37 AM

## 2022-04-13 NOTE — Significant Event (Signed)
Rapid Response Event Note   Reason for Call :  Asked to see pt as 2nd set of eyes for copious trach secretions, desaturations, and bradycardia during suctioning.   Per nursing/RT, pt has required frequent suctioning since arriving to PCU unit(6 times in 3 hours). When suctioning is requiring, pt drops SpO2 to 50%. During suctioning, pt becomes bradycardic to the 50s.   Initial Focused Assessment:  Pt lying in bed with eyes closed. Trach collar tubing has been detached from trach collar and pt is holding trach collar tubing in her hand, SpO2 is 86%(on RA). It is very difficult to get pt to let go of tubing. When tubing reattached, SpO2-95% on .40 TC. Pt with audible secretions in trach tube(this is just after suctioning was done by RT just prior to my arrival). Secretions are copious and thick. Lungs are rhonchus t/o. Skin warm and dry.   T-98.3, HR-79, BP-128/55, RR-22, SpO2-86% on RA, 95% on .40 TC.   Pt is requiring almost constant nursing and RT care at this time to assure trach tube does not occlude d/t secretions. Dr. Myna Hidalgo notified and reached out to [facility] for support. PCCM agrees with moving pt back to ICU for increased nursing/RT requirements at this time.   Interventions:  Tx to 2H20  Plan of Care:  Tx back to ICU as PCU pt for more intensive nursing/RT care. Pt to remain a TRH pt at this time.  Event Summary:   MD Notified: Dr. Myna Hidalgo Call [place] [phone] End Time:2200  Dillard Essex, RN

## 2022-04-13 NOTE — Progress Notes (Signed)
PROGRESS NOTE    [name]  RXY:[id number] DOB: 07-09-1951 DOA: 03/23/2022 PCP: Buzzy Han, MD (Inactive)    Brief Narrative:  [name] is a 71 y.o. female with medical history significant of CVA, diabetes mellitus type 2, hypertension, CKD stage IIIb, hyperlipidemia presented to hospital with weakness and lightheadedness for 5 days with fogginess and poor balance.  In the ED, due to her vertigo and progressive dysphagia symptoms, she underwent imaging of the brain which showed left medullary stroke.  Patient had increasing dysphagia and was unable to protect her airway so patient needed frequent suctioning due to her hypoxia and PCCM was consulted.  Patient was then intubated on 03/26/2022 for cardiac arrest likely secondary to aspiration versus hypoxia.  Unasyn was then initiated.  Patient was subsequently extubated on 04/01/22 and reintubated post extubation for stridor.  On 04/03/22, patient underwent tracheostomy placement and on 04/04/22 PEG tube was placed in.  On 04/05/2022 patient had some sinus bradycardia with pauses and was started on dopamine and electrophysiology cardiology was consulted.  On 525 patient had temporary pacemaker inserted.  Subsequently, patient was considered stable for transfer out of the ICU  Assessment and plan Principal Problem:   CVA (cerebral vascular accident) ([place]) Active Problems:   Hypothyroidism   DM2 (diabetes mellitus, type 2) ([place])   HLD (hyperlipidemia)   HTN (hypertension)   Stage 3b chronic kidney disease (CKD) ([place])   GERD (gastroesophageal reflux disease)   Normocytic anemia   Dysphagia due to recent stroke   CAD (coronary artery disease)   Acute respiratory failure (HCC)   Aspiration pneumonia (HCC)   Pulmonary edema   Sinus arrest   Acute respiratory failure with hypoxia, improved RLL aspiration pneumonia Completed course of antibiotic.  Currently on trach collar.  Continue tracheostomy care.  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  Continue aspirin and statins.  PCCM on board.  Seen by neurology during hospitalization.   Intermittent Symptomatic bradycardia / cardiac pause S/p systolic cardiac arrest x2 due to severe bradycardia Electrophysiology cardiology following and the impression is vagal pauses.  Bradycardia during suctioning.  Had temporary pacemaker which is on hold at this time.  Plan is to continue to monitor.   Essential HTN Continue amlodipine.   Hyperlipidemia Continue crestor, gemfibrozil    Acute blood loss anemia on anemia of chronic disease from tracheal site bleeding  Brilinta on hold.  We will need to resume before discharge.  Hemoglobin has remained stable.   Hypothyroidism  Continue synthroid    Poorly controlled DM2 with hyperglycemia Latest Hemoglobin A1c is 9.5 continue sliding scale insulin  Debility, deconditioning.  Patient has been seen by physical therapy working on therapy and recommend CIR at this time.      DVT prophylaxis: enoxaparin (LOVENOX) injection 40 mg Start: 04/06/22 1000 SCD's Start: 03/23/22 1948   Code Status:     Code Status: Full Code  Disposition: CIR  Status is: Inpatient  Remains inpatient appropriate because: Critically ill patient, status post trach and PEG, need for rehabilitation, further cardiac monitoring   Family Communication: None today.  Family was updated 5/31 by critical care team.  Consultants:  PCCM Electrophysiology cardiology Cardiology Neurology Palliative care Interventional radiology  Procedures:  Status post tracheostomy placement 04/03/22 Status post PEG tube placement 04/04/22 Temporary pacemaker placement  Antimicrobials:  None currently  Anti-infectives (From admission, onward)  Start     Dose/Rate Route Frequency Ordered  Stop   04/10/22 2000  vancomycin (VANCOREADY) IVPB 750 mg/150 mL  Status:  Discontinued        750 mg 150 mL/hr over 60 Minutes Intravenous Every 24 hours 04/09/22 1803 04/12/22 0859   04/09/22 1900  vancomycin (VANCOREADY) IVPB 1500 mg/300 mL        1,500 mg 150 mL/hr over 120 Minutes Intravenous  Once 04/09/22 1803 04/10/22 0700   04/04/22 1200  ceFAZolin (ANCEF) IVPB 2g/100 mL premix        2 g 200 mL/hr over 30 Minutes Intravenous  Once 04/04/22 1103 04/04/22 1134   03/30/22 1700  Ampicillin-Sulbactam (UNASYN) 3 g in sodium chloride 0.9 % 100 mL IVPB  Status:  Discontinued        3 g 200 mL/hr over 30 Minutes Intravenous Every 8 hours 03/30/22 1303 04/01/22 1220   03/26/22 2200  Ampicillin-Sulbactam (UNASYN) 3 g in sodium chloride 0.9 % 100 mL IVPB  Status:  Discontinued        3 g 200 mL/hr over 30 Minutes Intravenous Every 12 hours 03/26/22 1427 03/30/22 1303   03/25/22 1845  Ampicillin-Sulbactam (UNASYN) 3 g in sodium chloride 0.9 % 100 mL IVPB  Status:  Discontinued        3 g 200 mL/hr over 30 Minutes Intravenous Every 8 hours 03/25/22 1746 03/26/22 1427       Subjective: Today, patient was seen and examined at bedside.  Patient appears to be weak and febrile.  Following few commands including squeezing the hands.  Objective: Vitals:   04/13/22 0758 04/13/22 0800 04/13/22 0900 04/13/22 1000  BP:  (!) 142/51 136/61 (!) 115/44  Pulse:  86 81 76  Resp:  19 (!) 25 (!) 39  Temp: 98.5 F (36.9 C)     TempSrc: Oral     SpO2:  94% 94% 98%  Weight:      Height:        Intake/Output Summary (Last 24 hours) at 04/13/2022 1023 Last data filed at 04/13/2022 0600 Gross per 24 hour  Intake 700 ml  Output 800 ml  Net -100 ml   Filed Weights   04/11/22 0500 04/12/22 0500 04/13/22 0500  Weight: 73.9 kg 72.3 kg 73.3 kg    Physical Examination: Body mass index is 29.56 kg/m.   General:  Average built, not in obvious distress, trach collar in place, appears ill and  deconditioned and weak. HENT:   No scleral pallor or icterus noted. Oral mucosa is moist.  Tracheostomy collar in place. Chest:  Diminished breath sounds bilaterally. No crackles or wheezes.  CVS: S1 &S2 heard. No murmur.  Regular rate and rhythm. Abdomen: Soft, nontender, nondistended.  Bowel sounds are heard.  PEG tube in place. Extremities: No cyanosis, clubbing or edema.  Peripheral pulses are palpable. Psych: Alert, awake and follows commands, unable to verbalize due to tracheostomy. CNS: Moves extremities on verbal command, generalized weakness noted. Skin: Warm and dry.  No rashes noted.  Data Reviewed:   CBC: Recent Labs  Lab 04/09/22 0540 04/09/22 2142 04/09/22 2345 04/10/22 0001 04/11/22 0244 04/12/22 0543 04/13/22 0108  WBC 8.6  --   --  9.3 6.2 5.6 4.9  NEUTROABS  --   --   --  7.6  --   --   --   HGB 7.3*   < > 7.8* 7.5* 7.2* 7.1* 7.4*  HCT 23.4*   < > 23.0* 24.7* 22.9* 22.1*  23.9*  MCV 91.8  --   --  93.6 92.0 89.8 91.6  PLT 294  --   --  297 253 287 290   < > = values in this interval not displayed.    Basic Metabolic Panel: Recent Labs  Lab 04/09/22 0540 04/09/22 2142 04/09/22 2345 04/10/22 0001 04/11/22 0244 04/12/22 0543 04/13/22 0108  NA 140   < > 139 140 139 139 138  K 4.4   < > 4.3 4.5 4.2 4.3 4.1  CL 107  --   --  103 104 104 104  CO2 27  --   --  '27 26 28 26  '$ GLUCOSE 281*  --   --  325* 239* 110* 175*  BUN 26*  --   --  23 28* 23 24*  CREATININE 1.29*  --   --  1.36* 1.31* 1.23* 1.21*  CALCIUM 8.7*  --   --  8.9 8.5* 8.7* 8.9  MG 2.3  --   --  2.3 2.2 2.2 2.3   < > = values in this interval not displayed.    Liver Function Tests: Recent Labs  Lab 04/10/22 0001  AST 18  ALT 13  ALKPHOS 74  BILITOT 0.2*  PROT 6.7  ALBUMIN 2.4*     Radiology Studies: No results found.    LOS: 21 days    Flora Lipps, MD [facility] Available via Epic secure chat 7am-7pm After these hours, please refer to coverage provider listed  on [email or web address] 04/13/2022, 10:23 AM

## 2022-04-13 NOTE — Progress Notes (Signed)
Patient arrived from [facility] to 4E14 after prior admission for CVA and respiratory/cardiac arrest.  Telemetry monitor applied and CCMD notified.  RT was called to bedside to assess patient's trach. Copious drainage was present and suctioned.  CHG and skin assessment completed noting several small skin tears on both right and left buttocks.  Patient noted to be incontinent of bowel upon arrival.  Patient was bathed, bed linen and gown changed, purewick and suction drainage replaced and foam dressings placed on buttocks wounds.  Patient appeared to be comfortable with stable vital signs on 10L trach mask, FiO2 40.  Will continue to monitor.

## 2022-04-13 NOTE — Progress Notes (Signed)
Patient recently transferred out of ICU was seen for copious secretions requiring frequent suctioning.   This does not appear to be acute change, and does not appear to require mechanical ventilation again, but she is requiring more intensive nursing and RT care than can be provided on the progressive unit. Appreciate Dr. Patsey Berthold reviewing case; she agrees with transferring patient back to ICU for now for more intensive nursing care. Patient will remain on hospitalist service.

## 2022-04-13 NOTE — Hospital Course (Addendum)
[name] is a 71 y.o. female with medical history significant of CVA, diabetes mellitus type 2, hypertension, CKD stage IIIb, hyperlipidemia presented to the hospital with weakness and lightheadedness for 5 days with fogginess and poor balance.  In the ED, due to her vertigo and progressive dysphagia symptoms, she underwent imaging of the brain which showed left medullary stroke.  Patient had increasing dysphagia and was unable to protect her airway and patient needed frequent suctioning due to her hypoxia, PCCM was consulted.  Patient was intubated on 03/26/2022 for cardiac arrest likely secondary to aspiration versus hypoxia.  Unasyn was then initiated.  Patient was subsequently extubated on 04/01/22 and reintubated post extubation for stridor.  On 04/03/22, patient underwent tracheostomy placement and on 04/04/22 PEG tube was placed in.  On 04/05/2022, patient had some sinus bradycardia with pauses and was started on dopamine and electrophysiology cardiology was consulted.  On 04/06/22 patient had temporary pacemaker inserted.  Subsequently, patient was considered stable for transfer out of the ICU.  After transfer from the ICU, patient continues to have increasing secretions over the tracheostomy tube.  Cardiology does not recommend a pacemaker placement since episodes of bradycardia were thought to be secondary to vagal stimulation.  On 04/16/2022 patient has expressed wishes not to be escalated on the treatment.  Palliative care has been consulted to address goals of care.  Assessment and plan Principal Problem:   CVA (cerebral vascular accident) ([place]) Active Problems:   Hypothyroidism   DM2 (diabetes mellitus, type 2) ([place])   HLD (hyperlipidemia)   HTN (hypertension)   Stage 3b chronic kidney disease (CKD) (HCC)   GERD (gastroesophageal reflux disease)   Normocytic anemia   Dysphagia due to recent stroke   CAD (coronary artery disease)   Acute respiratory failure (HCC)   Aspiration  pneumonia (HCC)   Pulmonary edema   Sinus arrest   Acute respiratory failure with hypoxia,  RLL aspiration pneumonia Completed course of antibiotic.  Currently on trach collar.  PCCM following the patient for trach care with frequent suctioning.  Patient tried to take off the oxygen this morning and has expressed not to undergo aggressive treatment.  Currently back on oxygen in suctioning.. Robinul on Hold due to bradycardia.  On steroid inhalers at this time.  Continue tracheostomy care.  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  On aspirin and statins.  PCCM on board.  Seen by neurology during hospitalization.  Continue trach care  Nutrition.  On bolus tube feeding.  Tolerating so far.  Intermittent Symptomatic bradycardia / cardiac pause S/p systolic cardiac arrest x2 due to severe bradycardia Electrophysiology cardiology following and the impression is vagal pauses.  Patient had bradycardia during suctioning.  Patient had pulled out oxygen this morning and had bradycardia which has improved at this time.  Had temporary pacemaker which is on hold at this time.  Plan is to continue to monitor.   Essential HTN Continue amlodipine.   Hyperlipidemia Continue crestor, gemfibrozil    Acute blood loss anemia on anemia of chronic disease from tracheal site bleeding  Hemoglobin at 7.3. .  Continue to hold Brilinta for now.   Hypothyroidism  Continue synthroid    Poorly controlled diabetes mellitus type 2 with hyperglycemia Latest Hemoglobin A1c is 9.5, continue sliding scale insulin. Glycemic control okay.  Debility, deconditioning.  Patient has been seen by physical therapy, plan is  LTAC placement.  Goals of care.  Nursing staff has reported that patient had expressed not to continue with aggressive level of care.   Patient tried to remove the oxygen this morning.  Palliative care has been involved.

## 2022-04-13 NOTE — Progress Notes (Signed)
[name](dtr) updated regarding pt's tx back to 2H.

## 2022-04-13 NOTE — Progress Notes (Signed)
Patient seen today by trach team for consult.  No education is needed at this time.  All necessary equipment is at beside.   Will continue to follow for progression.  

## 2022-04-13 NOTE — Progress Notes (Signed)
Patient not cooperating pulling o2 off . Patient has copious amounts of mucus  clear mucus restless and holding arms over trach so we cannot put o2 back on. Sats desats to 50's with suctioning and H.R. to 50 . Per report from day shift R.N. Resp .  Therapy here and advises patient needs to go back to ICU for closer monitoring. Dr. Myna Hidalgo text paged at 1935 . Call return and he ask me to call Rapid Response to assess patient. Mindy R.N. aware and coming to see patient.

## 2022-04-13 NOTE — Progress Notes (Addendum)
Patient noted to have copious secretions requiring deep suctioning by RN three times and by RT twice in 3 hours.  RT called to bedside at this time.

## 2022-04-14 LAB — COMPREHENSIVE METABOLIC PANEL
ALT: 15 U/L (ref 0–44)
AST: 26 U/L (ref 15–41)
Albumin: 2.5 g/dL — ABNORMAL LOW (ref 3.5–5.0)
Alkaline Phosphatase: 80 U/L (ref 38–126)
Anion gap: 10 (ref 5–15)
BUN: 24 mg/dL — ABNORMAL HIGH (ref 8–23)
CO2: 28 mmol/L (ref 22–32)
Calcium: 8.9 mg/dL (ref 8.9–10.3)
Chloride: 100 mmol/L (ref 98–111)
Creatinine, Ser: 1.22 mg/dL — ABNORMAL HIGH (ref 0.44–1.00)
GFR, Estimated: 48 mL/min — ABNORMAL LOW (ref >60)
Glucose, Bld: 171 mg/dL — ABNORMAL HIGH (ref 70–99)
Potassium: 4.5 mmol/L (ref 3.5–5.1)
Sodium: 138 mmol/L (ref 135–145)
Total Bilirubin: 0.3 mg/dL (ref 0.3–1.2)
Total Protein: 6.8 g/dL (ref 6.5–8.1)

## 2022-04-14 LAB — CBC
HCT: 23.9 % — ABNORMAL LOW (ref 36.0–46.0)
Hemoglobin: 7.3 g/dL — ABNORMAL LOW (ref 12.0–15.0)
MCH: 27.8 pg (ref 26.0–34.0)
MCHC: 30.5 g/dL (ref 30.0–36.0)
MCV: 90.9 fL (ref 80.0–100.0)
Platelets: 304 10*3/uL (ref 150–400)
RBC: 2.63 MIL/uL — ABNORMAL LOW (ref 3.87–5.11)
RDW: 14.2 % (ref 11.5–15.5)
WBC: 7.1 10*3/uL (ref 4.0–10.5)
nRBC: 0.3 % — ABNORMAL HIGH (ref 0.0–0.2)

## 2022-04-14 LAB — GLUCOSE, CAPILLARY
Glucose-Capillary: 147 mg/dL — ABNORMAL HIGH (ref 70–99)
Glucose-Capillary: 96 mg/dL (ref 70–99)
Glucose-Capillary: 279 mg/dL — ABNORMAL HIGH (ref 70–99)
Glucose-Capillary: 103 mg/dL — ABNORMAL HIGH (ref 70–99)
Glucose-Capillary: 219 mg/dL — ABNORMAL HIGH (ref 70–99)

## 2022-04-14 LAB — MAGNESIUM: Magnesium: 2.3 mg/dL (ref 1.7–2.4)

## 2022-04-14 NOTE — TOC Progression Note (Signed)
Transition of Care [facility]) - Progression Note    Patient Details  Name: [name] MRN: [id number] Date of Birth: July 04, 1951  Transition of Care [facility]) CM/SW Contact  Bartholomew Crews, RN Phone Number: [phone] 04/14/2022, 4:04 PM  Clinical Narrative:     Notified by liaison, Anderson Malta, at [facility] that patient has initially denied LTAC and will need P2P once medically stable given recent cardiac concerns. Advised by MD that patient medically stable for LTAC. Appeal letter received, signed by MD, and faxed.   Expected Discharge Plan: Long Term Acute Care (LTAC) Barriers to Discharge: Continued Medical Work up  Expected Discharge Plan and Services Expected Discharge Plan: Long Term Acute Care (LTAC)   Discharge Planning Services: CM Consult Post Acute Care Choice: Long Term Acute Care (LTAC) Living arrangements for the past 2 months: Single Family Home                   DME Agency: NA       HH Arranged: NA           Social Determinants of Health (SDOH) Interventions    Readmission Risk Interventions     View : No data to display.

## 2022-04-14 NOTE — Progress Notes (Signed)
NAME:  [name], MRN:  [id number], DOB:  February 23, 1951, LOS: 35 ADMISSION DATE:  03/23/2022, CONSULTATION DATE:  5/13 REFERRING MD:  Sloan Leiter, CHIEF COMPLAINT:  Dyspnea   History of Present Illness:  71 y/o female presented with left medullary stroke on 5/11, developed dysphagia, inability to protect airway and moved to the ICU on 5/14 in the setting of a cardiac arrest brought on by aspiration vs hypoxemia. Had another cardiac arrest on 5/19 felt to be related to vagal response. Extubated on 5/20 then re-intubated for stridor, trach on 5/22. Eventually moved out of ICU but moved back to ICU on 6/1 for frequent suctioning needs.    Pertinent  Medical History   Past Medical History:  Diagnosis Date   Anemia    Arteriosclerotic cardiovascular disease    CKD (chronic kidney disease) stage 3, GFR 30-59 ml/min (HCC)    Claudication in peripheral vascular disease (HCC)    CVA (cerebral vascular accident) ([place])    2012 and 2019-affected left side + dysarthria 2/2 severe R ACA and MCA stenosis   Epistaxis 03/29/2022   GERD (gastroesophageal reflux disease)    Hyperlipidemia    declines statins   Hypertension    Hypothyroidism    Type 2 diabetes mellitus ([place]) 2000   Vitamin D deficiency      Significant Hospital Events: Including procedures, antibiotic start and stop dates in addition to other pertinent events   5/11 admitted 5/14 intubated for cardiac arrest, likely 2/2 aspiration vs hypoxia, Unasyn started 5/17 awake. Following commands. Sig difficulty w/ glycemic control. Adjustments made to basal and ss dosing 5/18 awake. VTs low on PSV. Failed initial am SBT (sats 60s; reporting SOB). Got SMPG still no BM 5/19 pulmonary edema on cxr getting lasix. SSE for constipation.  Brief arrest thought 2/2 to vagal response while turning for enema  5/20 extubated, reintubated for post extubation stridor 5/22 trach by CCM 5/23 IR PEG, trach color  5/24 sinus brady with pause. Started  dopa. EP consult 5/25 temp pacemaker insertion 5/26-27 trach collar, downsized to 4-0 5/28- pulled out pacemaker, bradycardic, 6-0 shiley cuffed placed and now on vent, sedating 5/31 had bradycardia with suctioning, 4 second pause. Required vent support 5/30-5/31.   6/1 Moved to ICU for secretion management, did not require mechanical ventilation  Interim History / Subjective:  Moved to ICU overnight for secretion management Stable this morning  Objective   Blood pressure 138/64, pulse 89, temperature 97.8 F (36.6 C), temperature source Axillary, resp. rate 20, height '5\' 2"'$  (1.575 m), weight 71.5 kg, SpO2 96 %.    FiO2 (%):  [35 %-40 %] 35 %   Intake/Output Summary (Last 24 hours) at 04/14/2022 0810 Last data filed at 04/13/2022 2200 Gross per 24 hour  Intake 540 ml  Output 250 ml  Net 290 ml   Filed Weights   04/12/22 0500 04/13/22 0500 04/14/22 0500  Weight: 72.3 kg 73.3 kg 71.5 kg    Examination:  General:  Resting comfortably in bed HENT: NCAT tracheostomy in place PULM: CTA B, normal effort CV: RRR, no mgr GI: BS+, soft, nontender, PEG in place MSK: normal bulk and tone Neuro: some eye opening, restless, moves around a lot in bed, doesn't follow commands   Resolved Hospital Problem list   Epistaxis Hyponatremia Bleeding from tracheostomy  AKI on CKD stage III  Assessment & Plan:  Principal Problem:   CVA (cerebral vascular accident) ([place]) Active Problems:   Hypothyroidism   DM2 (diabetes mellitus, type  2) ([place])   HLD (hyperlipidemia)   HTN (hypertension)   Stage 3b chronic kidney disease (CKD) (HCC)   GERD (gastroesophageal reflux disease)   Normocytic anemia   Dysphagia due to recent stroke   CAD (coronary artery disease)   Acute respiratory failure (HCC)   Aspiration pneumonia (HCC)   Pulmonary edema   Sinus arrest  Acute respiratory failure with hypoxemia RLL aspiration  Dysphagia from CVA Post extubation stridor S/p  trach/PEG  Discussion This morning (6/2) she is stable, no indication for mechanical ventilation or antibiotics.  Prior smoker, unclear if she has baseline lung disease.  Plan: Will add brovana/pulmicort given smoking history to see if that will help with secretions Continue prn albuterol Continue trach collar oxygen Continue tube feedings per PEG tube Continue suctioning per unit routine Hold off on adding robinul given post marketing reports of bradycardia Trach care per routine  Rest of care per primary service  PCCM will see again on Monday or sooner if needed    Best Practice (right click and "Reselect all SmartList Selections" daily)   Per [facility]  Labs   CBC: Recent Labs  Lab 04/10/22 0001 04/11/22 0244 04/12/22 0543 04/13/22 0108 04/14/22 0053  WBC 9.3 6.2 5.6 4.9 7.1  NEUTROABS 7.6  --   --   --   --   HGB 7.5* 7.2* 7.1* 7.4* 7.3*  HCT 24.7* 22.9* 22.1* 23.9* 23.9*  MCV 93.6 92.0 89.8 91.6 90.9  PLT 297 253 287 290 622    Basic Metabolic Panel: Recent Labs  Lab 04/10/22 0001 04/11/22 0244 04/12/22 0543 04/13/22 0108 04/14/22 0053  NA 140 139 139 138 138  K 4.5 4.2 4.3 4.1 4.5  CL 103 104 104 104 100  CO2 '27 26 28 26 28  '$ GLUCOSE 325* 239* 110* 175* 171*  BUN 23 28* 23 24* 24*  CREATININE 1.36* 1.31* 1.23* 1.21* 1.22*  CALCIUM 8.9 8.5* 8.7* 8.9 8.9  MG 2.3 2.2 2.2 2.3 2.3   GFR: Estimated Creatinine Clearance: 39.8 mL/min (A) (by C-G formula based on SCr of 1.22 mg/dL (H)). Recent Labs  Lab 04/11/22 0244 04/12/22 0543 04/13/22 0108 04/14/22 0053  WBC 6.2 5.6 4.9 7.1    Liver Function Tests: Recent Labs  Lab 04/10/22 0001 04/14/22 0053  AST 18 26  ALT 13 15  ALKPHOS 74 80  BILITOT 0.2* 0.3  PROT 6.7 6.8  ALBUMIN 2.4* 2.5*   No results for input(s): LIPASE, AMYLASE in the last 168 hours. No results for input(s): AMMONIA in the last 168 hours.  ABG    Component Value Date/Time   PHART 7.367 04/09/2022 2345   PCO2ART 50.2 (H)  04/09/2022 2345   PO2ART 79 (L) 04/09/2022 2345   HCO3 28.9 (H) 04/09/2022 2345   TCO2 30 04/09/2022 2345   ACIDBASEDEF 2.0 03/26/2022 1215   O2SAT 95 04/09/2022 2345     Coagulation Profile: No results for input(s): INR, PROTIME in the last 168 hours.  Cardiac Enzymes: No results for input(s): CKTOTAL, CKMB, CKMBINDEX, TROPONINI in the last 168 hours.  HbA1C: Hemoglobin A1C  Date/Time Value Ref Range Status  01/01/2019 01:43 PM 9.5 (A) 4.0 - 5.6 % Final  09/25/2018 03:11 PM 7.7 (A) 4.0 - 5.6 % Final   HbA1c POC (<> result, manual entry)  Date/Time Value Ref Range Status  03/25/2019 10:10 AM >14.0 (A) 4.0 - 5.6 % Final   Hgb A1c MFr Bld  Date/Time Value Ref Range Status  03/24/2022 02:14 AM 9.5 (H)  4.8 - 5.6 % Final    Comment:    (NOTE) Pre diabetes:          5.7%-6.4%  Diabetes:              >6.4%  Glycemic control for   <7.0% adults with diabetes   02/16/2018 02:00 AM 8.9 (H) 4.8 - 5.6 % Final    Comment:    (NOTE) Pre diabetes:          5.7%-6.4% Diabetes:              >6.4% Glycemic control for   <7.0% adults with diabetes     CBG: Recent Labs  Lab 04/13/22 2103 04/13/22 2211 04/13/22 2334 04/14/22 0342 04/14/22 0743  GLUCAP 153* 202* 202* 147* 96    Critical care time: n/a    Roselie Awkward, MD [place] PCCM Pager: [phone] Cell: [phone] After 7:00 pm call Elink  [phone]

## 2022-04-14 NOTE — Progress Notes (Addendum)
PROGRESS NOTE    [name]  VHQ:[id number] DOB: 1951/05/01 DOA: 03/23/2022 PCP: Buzzy Han, MD (Inactive)    Brief Narrative:  [name] is a 71 y.o. female with medical history significant of CVA, diabetes mellitus type 2, hypertension, CKD stage IIIb, hyperlipidemia presented to hospital with weakness and lightheadedness for 5 days with fogginess and poor balance.  In the ED, due to her vertigo and progressive dysphagia symptoms, she underwent imaging of the brain which showed left medullary stroke.  Patient had increasing dysphagia and was unable to protect her airway so patient needed frequent suctioning due to her hypoxia and PCCM was consulted.  Patient was then intubated on 03/26/2022 for cardiac arrest likely secondary to aspiration versus hypoxia.  Unasyn was then initiated.  Patient was subsequently extubated on 04/01/22 and reintubated post extubation for stridor.  On 04/03/22, patient underwent tracheostomy placement and on 04/04/22 PEG tube was placed in.  On 04/05/2022 patient had some sinus bradycardia with pauses and was started on dopamine and electrophysiology cardiology was consulted.  On 525 patient had temporary pacemaker inserted.  Subsequently, patient was considered stable for transfer out of the ICU  Assessment and plan Principal Problem:   CVA (cerebral vascular accident) ([place]) Active Problems:   Hypothyroidism   DM2 (diabetes mellitus, type 2) ([place])   HLD (hyperlipidemia)   HTN (hypertension)   Stage 3b chronic kidney disease (CKD) ([place])   GERD (gastroesophageal reflux disease)   Normocytic anemia   Dysphagia due to recent stroke   CAD (coronary artery disease)   Acute respiratory failure (HCC)   Aspiration pneumonia (HCC)   Pulmonary edema   Sinus arrest   Acute respiratory failure with hypoxia, improved RLL aspiration pneumonia Completed course of antibiotic.  Currently on trach collar.  Continue tracheostomy care. Has been having  more secretions so was transferred to progressive care unit.  PCCM following. Robinul on Hold due to bradycardia.  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  Continue aspirin and statins.  PCCM on board.  Seen by neurology during hospitalization.  Nutrition.  Patient will is currently on tube feeding.  Dietitian on board. Bolus tube feeding has been initiated.   Intermittent Symptomatic bradycardia / cardiac pause S/p systolic cardiac arrest x2 due to severe bradycardia Electrophysiology cardiology following and the impression is vagal pauses.  Patient had bradycardia during suctioning.  Had temporary pacemaker which is on hold at this time.  Plan is to continue to monitor.   Essential HTN Continue amlodipine.   Hyperlipidemia Continue crestor, gemfibrozil    Acute blood loss anemia on anemia of chronic disease from tracheal site bleeding  Brilinta on hold.  We will need to resume before discharge.  Hemoglobin has remained stable.   Hypothyroidism  Continue synthroid    Poorly controlled DM2 with hyperglycemia Latest Hemoglobin A1c is 9.5 continue sliding scale insulin.  We will adjust as necessary.  Debility, deconditioning.  Patient has been seen by physical therapy working on therapy and recommend CIR at this time.     DVT prophylaxis: enoxaparin (LOVENOX) injection 40 mg Start: 04/06/22 1000 SCD's Start: 03/23/22 1948   Code Status:     Code Status: Full Code  Disposition: CIR  Status is: Inpatient  Remains inpatient appropriate because:  status post trach and PEG, need for rehabilitation, need for frequent suctioning   Family Communication:  Spoke with  the patient's daughter on the phone and updated her about the clinical condition of the patient.   Consultants:  PCCM Electrophysiology  cardiology Cardiology Neurology Palliative care Interventional radiology  Procedures:  Status post tracheostomy placement 04/03/22 Status post PEG tube placement 04/04/22 Temporary pacemaker placement  Antimicrobials:  None currently  Anti-infectives (From admission, onward)    Start     Dose/Rate Route Frequency Ordered Stop   04/10/22 2000  vancomycin (VANCOREADY) IVPB 750 mg/150 mL  Status:  Discontinued        750 mg 150 mL/hr over 60 Minutes Intravenous Every 24 hours 04/09/22 1803 04/12/22 0859   04/09/22 1900  vancomycin (VANCOREADY) IVPB 1500 mg/300 mL        1,500 mg 150 mL/hr over 120 Minutes Intravenous  Once 04/09/22 1803 04/10/22 0700   04/04/22 1200  ceFAZolin (ANCEF) IVPB 2g/100 mL premix        2 g 200 mL/hr over 30 Minutes Intravenous  Once 04/04/22 1103 04/04/22 1134   03/30/22 1700  Ampicillin-Sulbactam (UNASYN) 3 g in sodium chloride 0.9 % 100 mL IVPB  Status:  Discontinued        3 g 200 mL/hr over 30 Minutes Intravenous Every 8 hours 03/30/22 1303 04/01/22 1220   03/26/22 2200  Ampicillin-Sulbactam (UNASYN) 3 g in sodium chloride 0.9 % 100 mL IVPB  Status:  Discontinued        3 g 200 mL/hr over 30 Minutes Intravenous Every 12 hours 03/26/22 1427 03/30/22 1303   03/25/22 1845  Ampicillin-Sulbactam (UNASYN) 3 g in sodium chloride 0.9 % 100 mL IVPB  Status:  Discontinued        3 g 200 mL/hr over 30 Minutes Intravenous Every 8 hours 03/25/22 1746 03/26/22 1427      Subjective: Today, patient was seen and examined at bedside.  Was transferred to progressive care due to frequent suctioning.  Able to phonate some.  Denies pain nausea vomiting but has mild difficulty breathing.   Objective: Vitals:   04/14/22 0900 04/14/22 1000 04/14/22 1027 04/14/22 1100  BP: 133/68 (!) 138/54  (!) 135/52  Pulse: 88 83 88 83  Resp: 20 (!) 29 (!) 28 (!) 27  Temp:      TempSrc:      SpO2: 93% 98% 97% 97%  Weight:      Height:        Intake/Output Summary (Last 24  hours) at 04/14/2022 1153 Last data filed at 04/14/2022 1000 Gross per 24 hour  Intake 540 ml  Output 400 ml  Net 140 ml   Filed Weights   04/12/22 0500 04/13/22 0500 04/14/22 0500  Weight: 72.3 kg 73.3 kg 71.5 kg    Physical Examination: Body mass index is 28.83 kg/m.   General:  Average built, not in obvious distress, trach collar in place, ill deconditioned and weak. HENT:   No scleral pallor or icterus noted. Oral mucosa is moist.  Tracheostomy in place. Chest:  .  Diminished breath sounds bilaterally. No crackles or wheezes.  CVS: S1 &S2 heard. No murmur.  Regular rate and rhythm. Abdomen: Soft, nontender, nondistended.  Bowel sounds are heard.  Tube in place. Extremities: No cyanosis, clubbing or edema.  Peripheral pulses are palpable. Psych: Alert, awake and communicative, follows command, weak phonation, CNS:  No cranial nerve deficits.  Weak phonation, able to verbalize with speaking valve, moves extremities on command Skin: Warm and dry.  No rashes noted.   Data Reviewed:   CBC: Recent Labs  Lab 04/10/22 0001 04/11/22 0244 04/12/22 0543 04/13/22 0108 04/14/22 0053  WBC 9.3 6.2 5.6 4.9 7.1  NEUTROABS 7.6  --   --   --   --   HGB 7.5* 7.2* 7.1* 7.4* 7.3*  HCT 24.7* 22.9* 22.1* 23.9* 23.9*  MCV 93.6 92.0 89.8 91.6 90.9  PLT 297 253 287 290 250    Basic Metabolic Panel: Recent Labs  Lab 04/10/22 0001 04/11/22 0244 04/12/22 0543 04/13/22 0108 04/14/22 0053  NA 140 139 139 138 138  K 4.5 4.2 4.3 4.1 4.5  CL 103 104 104 104 100  CO2 '27 26 28 26 28  '$ GLUCOSE 325* 239* 110* 175* 171*  BUN 23 28* 23 24* 24*  CREATININE 1.36* 1.31* 1.23* 1.21* 1.22*  CALCIUM 8.9 8.5* 8.7* 8.9 8.9  MG 2.3 2.2 2.2 2.3 2.3    Liver Function Tests: Recent Labs  Lab 04/10/22 0001 04/14/22 0053  AST 18 26  ALT 13 15  ALKPHOS 74 80  BILITOT 0.2* 0.3  PROT 6.7 6.8  ALBUMIN 2.4* 2.5*    Radiology Studies: No results found.    LOS: 22 days    Flora Lipps,  MD [facility] Available via Epic secure chat 7am-7pm After these hours, please refer to coverage provider listed on [email or web address] 04/14/2022, 11:53 AM

## 2022-04-14 NOTE — Progress Notes (Addendum)
Electrophysiology Rounding Note  Patient Name: [name] Date of Encounter: 04/14/2022  Primary Cardiologist: Trumaine Wimer Meredith Leeds, MD  Subjective   Disheveled in bed. NAD.   Inpatient Medications    Scheduled Meds:  amLODipine  10 mg Per Tube Daily   aspirin  81 mg Per Tube Daily   chlorhexidine  15 mL Mouth Rinse BID   Chlorhexidine Gluconate Cloth  6 each Topical Daily   dextrose       diclofenac  1 patch Transdermal BID   docusate  100 mg Per Tube BID   enoxaparin (LOVENOX) injection  40 mg Subcutaneous Q24H   famotidine  20 mg Per Tube Q24H   feeding supplement (JEVITY 1.5 CAL/FIBER)  237 mL Per Tube Daily   feeding supplement (JEVITY 1.5 CAL/FIBER)  474 mL Per Tube BID   feeding supplement (PROSource TF)  45 mL Per Tube BID   free water  200 mL Per Tube Q6H   gemfibrozil  600 mg Per Tube BID AC   insulin aspart  0-9 Units Subcutaneous TID WC   insulin aspart  8 Units Subcutaneous TID WC   insulin glargine-yfgn  35 Units Subcutaneous Daily   levothyroxine  88 mcg Per Tube Q0600   losartan  50 mg Per Tube Daily   mouth rinse  15 mL Mouth Rinse 10 times per day   polyethylene glycol  17 g Per Tube BID   rosuvastatin  10 mg Per Tube Daily   senna  2 tablet Per Tube BID   Continuous Infusions:  sodium chloride 20 mL/hr at 04/10/22 0600   sodium chloride Stopped (04/07/22 2043)   PRN Meds: sodium chloride, acetaminophen **OR** acetaminophen (TYLENOL) oral liquid 160 mg/5 mL **OR** acetaminophen, albuterol, hydrALAZINE, LORazepam, oxyCODONE, sodium chloride   Vital Signs    Vitals:   04/14/22 0338 04/14/22 0400 04/14/22 0500 04/14/22 0712  BP: (!) 151/59 (!) 129/53 138/64   Pulse: 87 86 79 89  Resp: (!) 25 (!) '23 20 20  '$ Temp:      TempSrc:      SpO2: 98% 97% 98% 96%  Weight:   71.5 kg   Height:        Intake/Output Summary (Last 24 hours) at 04/14/2022 0727 Last data filed at 04/13/2022 2200 Gross per 24 hour  Intake 540 ml  Output 250 ml  Net 290  ml   Filed Weights   04/12/22 0500 04/13/22 0500 04/14/22 0500  Weight: 72.3 kg 73.3 kg 71.5 kg    Physical Exam    GEN- The patient is chronically ill appearing.  HEENT - Oxygen on trach Lungs- Diminished Heart- Regular rate and rhythm, no murmurs, rubs or gallops GI- soft, NT, ND, + BS Extremities- no clubbing or cyanosis. No edema Skin- no rash or lesion Neuro- Alert to person and voice  Labs    CBC Recent Labs    04/13/22 0108 04/14/22 0053  WBC 4.9 7.1  HGB 7.4* 7.3*  HCT 23.9* 23.9*  MCV 91.6 90.9  PLT 290 503   Basic Metabolic Panel Recent Labs    04/13/22 0108 04/14/22 0053  NA 138 138  K 4.1 4.5  CL 104 100  CO2 26 28  GLUCOSE 175* 171*  BUN 24* 24*  CREATININE 1.21* 1.22*  CALCIUM 8.9 8.9  MG 2.3 2.3   Liver Function Tests Recent Labs    04/14/22 0053  AST 26  ALT 15  ALKPHOS 80  BILITOT 0.3  PROT 6.8  ALBUMIN 2.5*   No results for input(s): LIPASE, AMYLASE in the last 72 hours. Cardiac Enzymes No results for input(s): CKTOTAL, CKMB, CKMBINDEX, TROPONINI in the last 72 hours.   Telemetry    NSR 60-80s (personally reviewed)  Radiology    No results found.  Patient Profile     71 y.o. female with a hx of prior CVA 2012 and 2019 with left side weakness and dysarthria 2/2 severe R ACA and MCA stenosis, DM 2, hypertension, CKD 3B, CAD, PAD, and hyperlipidemia    She presented to ED 03/23/2022 for weakness and lightheadedness that started 5 days prior.  She was admitted for near syncope.  MRI brain with small acute to early subacute infarct in the left aspect of the medulla with white matter microangiopathy and small remote lacunar infarcts in the basal ganglia and right caudate head.  Neurology was consulted.  She also reported choking on food.  Core track was attempted but then removed for bleeding.  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  On 03/25/2022 she continued to choke, PCCM consulted.  left medulalry infarct likely  secondary large vessel disease of left V4 high grade stenosis vs. Short segmental occlusion   Echocardiogram 03/24/2022 revealed LVEF 60 to 52%, grade 1 diastolic dysfunction, normal RV, no significant valvular disease.   ON 03/26/2022 at approximately 12:45 PM CODE BLUE was called for PEA arrest following a violent coughing episode, patient stated she felt like she was choking, she desaturated with O2 readings in the 20% range, she became asystolic.  CPR was initiated and ROSC was achieved after 2 minutes of CPR with ACLS protocol.  She received 1 dose of epinephrine.  Post ROSC patient was intubated and placed on mechanical ventilation.  Telemetry reviewed and shows normal sinus rhythm just prior to arrest.  Arrest felt secondary to aspiration versus hypoxia.  She remained intubated and failed SBT.  On 03/31/22 she arrested again with left decubitus postioning for enema - ROSC after 1 min of CPR followed by seizure like activity consistent with convulsive syncope.  She was extubated on 04/01/2022 but reintubated for stridor postextubation.   Bronchoscopy on 04/03/2022 performed for ETT placement and tracheostomy.  She underwent PEG placement 04/04/2022.  She completed Unasyn.  She was started on aspirin and Brilinta.  She is now receiving tube feeds and AKI has improved.   On 04/05/2022, telemetry showed intermittent bradycardia into the 20s and 30s.  1 pause of 3.44 seconds, several other pauses less than 3 seconds.  Cardiology was consulted.  Assessment & Plan    Intermittent sinus arrest:  Primarily appeared vagal though long pauses.  Post temp wire -> Pt pulled 5/27. Her rhythm has been stable post pulling the temp wire.  Brief bradycardia 5/30 in setting of trach suctioning with concurrent R-R and P-P prolongation.  She had copious secretions requiring deep suctioning yesterday and had no further significant bradycardia or pauses.    CVA Delirium Per primary team    4. Acute hypoxic respiratory  failure Per primary team. Intermittently requiring vent via trach.    5. Anemia Hgb 7.1 -> 7.4 -> 7.3 Per primary.   EP to see as needed over the weekend and likely follow at a distance. Please call back with any significant pause or bradycardia.   For questions or updates, please contact [place] Please consult [email or web address] for contact info under Cardiology/STEMI.  Signed, Shirley Friar, PA-C  04/14/2022, 7:27 AM   I have seen and examined  this patient with Oda Kilts.  Agree with above, note added to reflect my findings.  Patient remains mildly confused.  No acute distress.  GEN: Well nourished, well developed, in no acute distress  HEENT: normal  Neck: no JVD, carotid bruits, or masses Cardiac: RRR; no murmurs, rubs, or gallops,no edema  Respiratory:  clear to auscultation bilaterally, normal work of breathing GI: soft, nontender, nondistended, + BS MS: no deformity or atrophy  Skin: warm and dry Neuro:  Strength and sensation are intact Psych: euthymic mood, full affect   Intermittent sinus arrest: Appears to be vagally mediated.  Fortunately she has not had any episodes of sinus arrest over the last few days.  She has no indication for pacemaker implant at this time.  We Elisa Kutner continue telemetry.  EP to sign off for now.  If she has further pauses during waking hours that or not vagally mediated and not induced by suctioning or other procedures, would call EP back for reevaluation.  Tyannah Sane M. Minette Manders MD 04/14/2022 3:02 PM

## 2022-04-14 NOTE — Progress Notes (Signed)
Physical Therapy Treatment Patient Details Name: [name] MRN: [id number] DOB: Oct 28, 1951 Today's Date: 04/14/2022   History of Present Illness Pt is a 71 y.o. female admitted 03/23/22 with dizziness, balance issues, hoarse voice. 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 Code blue 5/14, pt requiring CPR and intubated. Trach placed 5/22. PEG placed 5/23. Temporary pacer placed 5/25; pt pulled out pacer 5/28; plan to hold on permanent pacemaker implant. Pt back on vent support 5/28 due to hypoxia, bradycardia. Course complicated by deliriuim. PMH includes HTN, DM, CKD 3, CVA.    PT Comments    Pt is continuing to demonstrate gradual progress with mobility, standing the first few reps in the stedy with modAx1. However, as she fatigued she required TA x2 to come to partial stand the final rep to transfer pt back to bed. Focused session on also challenging pt's cognition while trying to reduce her pushing through her R UE towards her L and trying to improve her midline orientation/alignment. Good success noted with visual cues. She would likely benefit from using a mirror for visual cues in a future session. Will continue to follow acutely. Current recommendations remain appropriate.     Recommendations for follow up therapy are one component of a multi-disciplinary discharge planning process, led by the attending physician.  Recommendations may be updated based on patient status, additional functional criteria and insurance authorization.  Follow Up Recommendations  Acute inpatient rehab (3hours/day)     Assistance Recommended at Discharge Frequent or constant Supervision/Assistance  Patient can return home with the following Two people to help with walking and/or transfers;A lot of help with bathing/dressing/bathroom;Assist for transportation;Help with stairs or ramp for entrance;Assistance with cooking/housework;Direct supervision/assist for medications management;Direct  supervision/assist for financial management   Equipment Recommendations  Rolling walker (2 wheels);BSC/3in1;Wheelchair (measurements PT);Wheelchair cushion (measurements PT);Hospital bed;Other (comment) (hoyer lift)    Recommendations for Other Services       Precautions / Restrictions Precautions Precautions: Fall;Other (comment) Precaution Comments: trach, PEG, pushing syndrome Restrictions Weight Bearing Restrictions: No     Mobility  Bed Mobility Overal bed mobility: Needs Assistance Bed Mobility: Supine to Sit, Sit to Supine     Supine to sit: Mod assist, HOB elevated Sit to supine: Total assist   General bed mobility comments: Pt bringing legs off EOB when cued, needing modA to ascend trunk and scoot hips to EOB, HOB elevated. TA to return to supine when pt was fatigued.    Transfers Overall transfer level: Needs assistance Equipment used: Ambulation equipment used Transfers: Sit to/from Stand Sit to Stand: Mod assist, +2 safety/equipment, Total assist, From elevated surface, +2 physical assistance           General transfer comment: Pt needing modAx1 to come to stand from EOB to stedy, needing cues to not push self/lean to L. Benefits from visual cues to reduce L lean. Pt perfroming several mini sit to stands from stedy flaps with modA to prevent LOB due to strong L lateral lean. As pt fatigued standing/sitting in stedy, she required TAx2 to come to partial stand to lift flaps to return pt to bed.    Ambulation/Gait               General Gait Details: unable   Stairs             Wheelchair Mobility    Modified Rankin (Stroke Patients Only) Modified Rankin (Stroke Patients Only) Pre-Morbid Rankin Score: No symptoms Modified Rankin: Severe disability     Balance  Overall balance assessment: Needs assistance Sitting-balance support: Single extremity supported, Bilateral upper extremity supported, Feet supported, Feet unsupported Sitting  balance-Leahy Scale: Poor Sitting balance - Comments: Pt with strong push from R UE towards her L, needing cues to find and maintain midline. Bed lifted for feet to be unsupported and then cued pt to prop up on R elbow and then progress to lateral and then more medial placement of R UE on bed to reduce pushing. Benefits from visual cues to line self up with PT anterior to her or reduce leaning into PT to her L. Up to maxA to sit statically EOB and moments of minA. Postural control: Left lateral lean Standing balance support: Bilateral upper extremity supported Standing balance-Leahy Scale: Poor Standing balance comment: ModA to stand initially in stedy but as pt fatigued she needed TAx2 to come to partial stand the last rep                            Cognition Arousal/Alertness: Awake/alert Behavior During Therapy: Flat affect, Restless Overall Cognitive Status: Impaired/Different from baseline Area of Impairment: Attention, Following commands, Safety/judgement, Awareness, Problem solving                   Current Attention Level: Sustained   Following Commands: Follows one step commands with increased time, Follows one step commands inconsistently Safety/Judgement: Decreased awareness of safety, Decreased awareness of deficits Awareness: Intellectual Problem Solving: Slow processing, Difficulty sequencing, Requires verbal cues, Requires tactile cues, Decreased initiation General Comments: Pt attempting to follow majority of simple commands, but as she fatigued she followed less. Pt with poor midline orientation awareness, benefiting from multi-modal, especially visual cues, to correct. needs many cues to identify the issue and problem solve how to correct it.        Exercises      General Comments General comments (skin integrity, edema, etc.): Unable to read SpO2 while standing in stedy, but pt reporting later that she did feel a little lightheaded      Pertinent  Vitals/Pain Pain Assessment Pain Assessment: Faces Faces Pain Scale: Hurts a little bit Pain Location: generalized Pain Descriptors / Indicators: Grimacing Pain Intervention(s): Monitored during session, Limited activity within patient's tolerance, Repositioned    Home Living                          Prior Function            PT Goals (current goals can now be found in the care plan section) Acute Rehab PT Goals Patient Stated Goal: to lay down after session PT Goal Formulation: With patient Time For Goal Achievement: 04/24/22 Potential to Achieve Goals: Good Progress towards PT goals: Progressing toward goals    Frequency    Min 4X/week      PT Plan Current plan remains appropriate    Co-evaluation              AM-PAC PT "6 Clicks" Mobility   Outcome Measure  Help needed turning from your back to your side while in a flat bed without using bedrails?: A Lot Help needed moving from lying on your back to sitting on the side of a flat bed without using bedrails?: A Lot Help needed moving to and from a bed to a chair (including a wheelchair)?: Total Help needed standing up from a chair using your arms (e.g., wheelchair or bedside chair)?: Total Help  needed to walk in hospital room?: Total Help needed climbing 3-5 steps with a railing? : Total 6 Click Score: 8    End of Session Equipment Utilized During Treatment: Oxygen Activity Tolerance: Patient limited by fatigue Patient left: in bed;with call bell/phone within reach;with bed alarm set (left mittens off per RN request as pt did report she would not pull her lines) Nurse Communication: Mobility status PT Visit Diagnosis: Other symptoms and signs involving the nervous system (R29.898);Dizziness and giddiness (R42);Hemiplegia and hemiparesis;Other abnormalities of gait and mobility (R26.89);Unsteadiness on feet (R26.81);Muscle weakness (generalized) (M62.81) Hemiplegia - Right/Left: Right Hemiplegia -  dominant/non-dominant: Dominant Hemiplegia - caused by: Cerebral infarction     Time: 9373-4287 PT Time Calculation (min) (ACUTE ONLY): 28 min  Charges:  $Therapeutic Activity: 8-22 mins $Neuromuscular Re-education: 8-22 mins                     Moishe Spice, PT, DPT Acute Rehabilitation Services  Pager: [phone] Office: [phone]    Orvan Falconer 04/14/2022, 5:50 PM

## 2022-04-14 NOTE — Progress Notes (Signed)
Inpatient Diabetes Program Recommendations  AACE/ADA: New Consensus Statement on Inpatient Glycemic Control (2015)  Target Ranges:  Prepandial:   less than 140 mg/dL      Peak postprandial:   less than 180 mg/dL (1-2 hours)      Critically ill patients:  140 - 180 mg/dL   Lab Results  Component Value Date   GLUCAP 96 04/14/2022   HGBA1C 9.5 (H) 03/24/2022    Review of Glycemic Control  Latest Reference Range & Units 04/13/22 20:11 04/13/22 21:03 04/13/22 22:11 04/13/22 23:34 04/14/22 03:42 04/14/22 07:43  Glucose-Capillary 70 - 99 mg/dL 66 (L) 153 (H) 202 (H) 202 (H) 147 (H) 96  (L): Data is abnormally low (H): Data is abnormally high Diabetes history: Type 2 DM Outpatient Diabetes medications: Lantus 26 units BID Current orders for Inpatient glycemic control: Novolog 0-9 units TID, Novolog 8 units TID, Semglee 35 units QD Jevity 474 ml BID  Inpatient Diabetes Program Recommendations:    Noted hypoglycemia following transition from [place] to 4E; assuming related to administration of tube feed coverage although orders had been updated for tube feeds to BID instead of continuous.  Consider changing correction to Novolog 0-9 units Q4H and Novolog 8 units BID with scheduled administration of Jevity (0800 & 1400). To be held in the event bolus feeds are not given.   Thanks, Bronson Curb, MSN, RNC-OB Diabetes Coordinator [phone] (8a-5p)

## 2022-04-14 NOTE — Progress Notes (Addendum)
Speech Language Pathology Treatment: Dysphagia;Passy Muir Speaking valve; cognition Patient Details Name: [name] MRN: [id number] DOB: December 24, 1950 Today's Date: 04/14/2022 Time: 4163-8453 SLP Time Calculation (min) (ACUTE ONLY): 18 min  Assessment / Plan / Recommendation Clinical Impression  [name] participated well today and was able to remain awake, prefers to keep eyes closed but will open on command. Cuff just slightly inflated and therapist deflated remainder and donned valve which she wore for 25 min with therapist present and continued to wear with RN. She was conversive, able to achieve periods of intelligible verbalization, stating she had a stroke and asking relevant questions re: her care. Respiratory support was moderate and verbal/demonstration needed to slow rate, increase articulatory movement and breath support.  Trial of liquids thickened in between a nectar and honey consistency given resulting in suspected delayed swallow and significant delayed coughing. Did not observe cranberry juice from trachea when valve removed but cannot determine aspiration from bedside. Recovery will be slow however she is making good progress.  She can wear valve during all waking hours. Can trach be changed to cuffless?    HPI HPI: Pt is a 71 y.o. female who presenting with weakness and lightheadedness. MRI 5/11: Small acute to early subacute infarct in the left aspect of the  medulla. Pt found tohave severe neuromuscular dysphagia after MBS. Pt then intubated 5/14-5/22 and trached.  PMH: CVA, DM2, HTN, CKD3b, HLD.      SLP Plan  Continue with current plan of care      Recommendations for follow up therapy are one component of a multi-disciplinary discharge planning process, led by the attending physician.  Recommendations may be updated based on patient status, additional functional criteria and insurance authorization.    Recommendations  Diet recommendations: NPO Medication  Administration: Via alternative means      Patient may use Passy-Muir Speech Valve: During all therapies with supervision PMSV Supervision: Full MD: Please consider changing trach tube to : Cuffless         Oral Care Recommendations: Oral care QID Follow Up Recommendations: Skilled nursing-short term rehab (<3 hours/day) Assistance recommended at discharge: Frequent or constant Supervision/Assistance SLP Visit Diagnosis: Aphonia (R49.1);Dysphagia, unspecified (R13.10) Plan: Continue with current plan of care           Houston Siren  04/14/2022, 9:18 AM

## 2022-04-15 LAB — GLUCOSE, CAPILLARY
Glucose-Capillary: 103 mg/dL — ABNORMAL HIGH (ref 70–99)
Glucose-Capillary: 122 mg/dL — ABNORMAL HIGH (ref 70–99)
Glucose-Capillary: 190 mg/dL — ABNORMAL HIGH (ref 70–99)
Glucose-Capillary: 204 mg/dL — ABNORMAL HIGH (ref 70–99)
Glucose-Capillary: 223 mg/dL — ABNORMAL HIGH (ref 70–99)
Glucose-Capillary: 239 mg/dL — ABNORMAL HIGH (ref 70–99)

## 2022-04-15 LAB — COMPREHENSIVE METABOLIC PANEL
ALT: 17 U/L (ref 0–44)
AST: 28 U/L (ref 15–41)
Albumin: 2.5 g/dL — ABNORMAL LOW (ref 3.5–5.0)
Alkaline Phosphatase: 87 U/L (ref 38–126)
Anion gap: 10 (ref 5–15)
BUN: 24 mg/dL — ABNORMAL HIGH (ref 8–23)
CO2: 28 mmol/L (ref 22–32)
Calcium: 8.8 mg/dL — ABNORMAL LOW (ref 8.9–10.3)
Chloride: 97 mmol/L — ABNORMAL LOW (ref 98–111)
Creatinine, Ser: 1.26 mg/dL — ABNORMAL HIGH (ref 0.44–1.00)
GFR, Estimated: 46 mL/min — ABNORMAL LOW (ref >60)
Glucose, Bld: 217 mg/dL — ABNORMAL HIGH (ref 70–99)
Potassium: 4.6 mmol/L (ref 3.5–5.1)
Sodium: 135 mmol/L (ref 135–145)
Total Bilirubin: 0.1 mg/dL — ABNORMAL LOW (ref 0.3–1.2)
Total Protein: 7 g/dL (ref 6.5–8.1)

## 2022-04-15 LAB — CBC WITH DIFFERENTIAL/PLATELET
Abs Immature Granulocytes: 0.03 10*3/uL (ref 0.00–0.07)
Basophils Absolute: 0 10*3/uL (ref 0.0–0.1)
Basophils Relative: 0 %
Eosinophils Absolute: 0 10*3/uL (ref 0.0–0.5)
Eosinophils Relative: 0 %
HCT: 22.4 % — ABNORMAL LOW (ref 36.0–46.0)
Hemoglobin: 7.1 g/dL — ABNORMAL LOW (ref 12.0–15.0)
Immature Granulocytes: 1 %
Lymphocytes Relative: 30 %
Lymphs Abs: 1.6 10*3/uL (ref 0.7–4.0)
MCH: 28.7 pg (ref 26.0–34.0)
MCHC: 31.7 g/dL (ref 30.0–36.0)
MCV: 90.7 fL (ref 80.0–100.0)
Monocytes Absolute: 0.5 10*3/uL (ref 0.1–1.0)
Monocytes Relative: 10 %
Neutro Abs: 3.1 10*3/uL (ref 1.7–7.7)
Neutrophils Relative %: 59 %
Platelets: 301 10*3/uL (ref 150–400)
RBC: 2.47 MIL/uL — ABNORMAL LOW (ref 3.87–5.11)
RDW: 14.6 % (ref 11.5–15.5)
WBC: 5.3 10*3/uL (ref 4.0–10.5)
nRBC: 0.4 % — ABNORMAL HIGH (ref 0.0–0.2)

## 2022-04-15 LAB — CBC
HCT: 22.2 % — ABNORMAL LOW (ref 36.0–46.0)
Hemoglobin: 7 g/dL — ABNORMAL LOW (ref 12.0–15.0)
MCH: 28.6 pg (ref 26.0–34.0)
MCHC: 31.5 g/dL (ref 30.0–36.0)
MCV: 90.6 fL (ref 80.0–100.0)
Platelets: 314 10*3/uL (ref 150–400)
RBC: 2.45 MIL/uL — ABNORMAL LOW (ref 3.87–5.11)
RDW: 14.4 % (ref 11.5–15.5)
WBC: 5.9 10*3/uL (ref 4.0–10.5)
nRBC: 0.3 % — ABNORMAL HIGH (ref 0.0–0.2)

## 2022-04-15 LAB — MAGNESIUM: Magnesium: 2.2 mg/dL (ref 1.7–2.4)

## 2022-04-15 NOTE — Progress Notes (Signed)
PROGRESS NOTE    [name]  XNA:[id number] DOB: March 05, 1951 DOA: 03/23/2022 PCP: Buzzy Han, MD (Inactive)    Brief Narrative:  [name] is a 71 y.o. female with medical history significant of CVA, diabetes mellitus type 2, hypertension, CKD stage IIIb, hyperlipidemia presented to hospital with weakness and lightheadedness for 5 days with fogginess and poor balance.  In the ED, due to her vertigo and progressive dysphagia symptoms, she underwent imaging of the brain which showed left medullary stroke.  Patient had increasing dysphagia and was unable to protect her airway so patient needed frequent suctioning due to her hypoxia and PCCM was consulted.  Patient was then intubated on 03/26/2022 for cardiac arrest likely secondary to aspiration versus hypoxia.  Unasyn was then initiated.  Patient was subsequently extubated on 04/01/22 and reintubated post extubation for stridor.  On 04/03/22, patient underwent tracheostomy placement and on 04/04/22 PEG tube was placed in.  On 04/05/2022 patient had some sinus bradycardia with pauses and was started on dopamine and electrophysiology cardiology was consulted.  On 5/25 patient had temporary pacemaker inserted.  Subsequently, patient was considered stable for transfer out of the ICU.  Assessment and plan Principal Problem:   CVA (cerebral vascular accident) ([place]) Active Problems:   Hypothyroidism   DM2 (diabetes mellitus, type 2) ([place])   HLD (hyperlipidemia)   HTN (hypertension)   Stage 3b chronic kidney disease (CKD) (HCC)   GERD (gastroesophageal reflux disease)   Normocytic anemia   Dysphagia due to recent stroke   CAD (coronary artery disease)   Acute respiratory failure (HCC)   Aspiration pneumonia (HCC)   Pulmonary edema   Sinus arrest   Acute respiratory failure with hypoxia, improved RLL aspiration pneumonia Completed course of antibiotic.  Currently on trach collar.  Continue tracheostomy care. Has been having  more secretions so was transferred to progressive care unit.  Had an episode of increased secretion and possible potential mucous plug with bradycardia which resolved with suctioning yesterday.  PCCM following. Robinul on Hold due to bradycardia.  On steroid inhalers at this time.  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  Continue aspirin and statins.  PCCM on board.  Seen by neurology during hospitalization.  Continue trach care  Nutrition.  On bolus tube feeding.  Tolerating  Intermittent Symptomatic bradycardia / cardiac pause S/p systolic cardiac arrest x2 due to severe bradycardia Electrophysiology cardiology following and the impression is vagal pauses.  Patient had bradycardia during suctioning.  Had temporary pacemaker which is on hold at this time.  Plan is to continue to monitor.   Essential HTN Continue amlodipine.   Hyperlipidemia Continue crestor, gemfibrozil    Acute blood loss anemia on anemia of chronic disease from tracheal site bleeding  Brilinta on hold.  We will need to resume before discharge.  Latest hemoglobin of 7.0.  Will transfuse for hemoglobin less than 7.  Hemodynamically stable.  Continue to hold Brilinta for now.   Hypothyroidism  Continue synthroid    Poorly controlled DM2 with hyperglycemia Latest Hemoglobin A1c is 9.5 continue sliding scale insulin.  We will adjust as necessary.  Debility, deconditioning.  Patient has been seen by physical therapy working on therapy and recommend CIR at this time.  Patient would likely benefit from LTAC placement.     DVT prophylaxis: enoxaparin (LOVENOX) injection 40 mg Start: 04/06/22 1000 SCD's Start: 03/23/22 1948  Code Status:     Code Status: Full Code  Disposition: CIR  Status is: Inpatient  Remains inpatient appropriate because:   status post trach and PEG, need for rehabilitation, need for frequent suctioning   Family Communication:  Spoke with the patient's daughter on 04/14/2022  Consultants:  PCCM Electrophysiology cardiology Cardiology Neurology Palliative care Interventional radiology  Procedures:  Status post tracheostomy placement 04/03/22 Status post PEG tube placement 04/04/22 Temporary pacemaker placement  Antimicrobials:  None currently  Anti-infectives (From admission, onward)    Start     Dose/Rate Route Frequency Ordered Stop   04/10/22 2000  vancomycin (VANCOREADY) IVPB 750 mg/150 mL  Status:  Discontinued        750 mg 150 mL/hr over 60 Minutes Intravenous Every 24 hours 04/09/22 1803 04/12/22 0859   04/09/22 1900  vancomycin (VANCOREADY) IVPB 1500 mg/300 mL        1,500 mg 150 mL/hr over 120 Minutes Intravenous  Once 04/09/22 1803 04/10/22 0700   04/04/22 1200  ceFAZolin (ANCEF) IVPB 2g/100 mL premix        2 g 200 mL/hr over 30 Minutes Intravenous  Once 04/04/22 1103 04/04/22 1134   03/30/22 1700  Ampicillin-Sulbactam (UNASYN) 3 g in sodium chloride 0.9 % 100 mL IVPB  Status:  Discontinued        3 g 200 mL/hr over 30 Minutes Intravenous Every 8 hours 03/30/22 1303 04/01/22 1220   03/26/22 2200  Ampicillin-Sulbactam (UNASYN) 3 g in sodium chloride 0.9 % 100 mL IVPB  Status:  Discontinued        3 g 200 mL/hr over 30 Minutes Intravenous Every 12 hours 03/26/22 1427 03/30/22 1303   03/25/22 1845  Ampicillin-Sulbactam (UNASYN) 3 g in sodium chloride 0.9 % 100 mL IVPB  Status:  Discontinued        3 g 200 mL/hr over 30 Minutes Intravenous Every 8 hours 03/25/22 1746 03/26/22 1427      Subjective: Today, patient was seen and examined at bedside.  Nursing staff reported that she had a mucous plug and needed to suction yesterday with bradycardia which has resolved at this time.      Objective: Vitals:   04/15/22 0500 04/15/22 0713 04/15/22 0745 04/15/22 0800  BP: (!) 122/59   120/62   Pulse:  74  77  Resp: (!) 26 (!) 22  (!) 27  Temp:   98.1 F (36.7 C)   TempSrc:   Axillary   SpO2:  94%  94%  Weight: 72.2 kg     Height:        Intake/Output Summary (Last 24 hours) at 04/15/2022 0849 Last data filed at 04/15/2022 0800 Gross per 24 hour  Intake 600 ml  Output 901 ml  Net -301 ml   Filed Weights   04/13/22 0500 04/14/22 0500 04/15/22 0500  Weight: 73.3 kg 71.5 kg 72.2 kg    Physical Examination: Body mass index is 29.11 kg/m.   General:  Average built, not in obvious distress, appears ill and deconditioned and weak HENT:   No scleral pallor or icterus noted. Oral mucosa is moist.  Trach collar in place, Chest:    Diminished breath sounds bilaterally.  CVS: S1 &S2 heard. No murmur.  Regular rate and rhythm. Abdomen: Soft, nontender, nondistended.  Bowel sounds are heard.  PEG tube in place. Extremities: No cyanosis, clubbing or edema.  Peripheral pulses are palpable. Psych: Alert, awake and able to communicate and has been following commands. CNS:  No cranial  nerve deficits.  Moves extremities. Skin: Warm and dry.  No rashes noted.  Data Reviewed:   CBC: Recent Labs  Lab 04/10/22 0001 04/11/22 0244 04/12/22 0543 04/13/22 0108 04/14/22 0053 04/15/22 0036  WBC 9.3 6.2 5.6 4.9 7.1 5.9  NEUTROABS 7.6  --   --   --   --   --   HGB 7.5* 7.2* 7.1* 7.4* 7.3* 7.0*  HCT 24.7* 22.9* 22.1* 23.9* 23.9* 22.2*  MCV 93.6 92.0 89.8 91.6 90.9 90.6  PLT 297 253 287 290 304 656    Basic Metabolic Panel: Recent Labs  Lab 04/11/22 0244 04/12/22 0543 04/13/22 0108 04/14/22 0053 04/15/22 0036  NA 139 139 138 138 135  K 4.2 4.3 4.1 4.5 4.6  CL 104 104 104 100 97*  CO2 '26 28 26 28 28  '$ GLUCOSE 239* 110* 175* 171* 217*  BUN 28* 23 24* 24* 24*  CREATININE 1.31* 1.23* 1.21* 1.22* 1.26*  CALCIUM 8.5* 8.7* 8.9 8.9 8.8*  MG 2.2 2.2 2.3 2.3 2.2    Liver Function Tests: Recent Labs  Lab 04/10/22 0001 04/14/22 0053 04/15/22 0036  AST '18 26 28  '$ ALT '13 15 17   '$ ALKPHOS 74 80 87  BILITOT 0.2* 0.3 0.1*  PROT 6.7 6.8 7.0  ALBUMIN 2.4* 2.5* 2.5*    Radiology Studies: No results found.    LOS: 23 days    Flora Lipps, MD [facility] Available via Epic secure chat 7am-7pm After these hours, please refer to coverage provider listed on [email or web address] 04/15/2022, 8:49 AM

## 2022-04-15 NOTE — Progress Notes (Signed)
eLink Physician-Brief Progress Note Patient Name: Onnie Alatorre DOB: 12/19/1950 MRN: [id number]   Date of Service  04/15/2022  HPI/Events of Note  Episode of desaturation to 60s and HR 30s Copious secretions suctioned from trach collar while on CPT Vitals normalized <10 sec without additional intervention  Currently SpO2 97% and HR 76 in NSR  eICU Interventions  Continue to monitor Tele Aggressive pulmonary toilet     Intervention Category Intermediate Interventions: Respiratory distress - evaluation and management  Cinderella Christoffersen Rodman Pickle 04/15/2022, 12:28 AM

## 2022-04-15 NOTE — Progress Notes (Signed)
Nurse and charge nurse assessed patient's alert and orientated status.  Pt with speaking valve stated correctly A&x4 questions, Elink observed on camera.   Pt states she is "ready to go, and wants to be done with treatments" "wants to just be comfortable" and understands the risks.

## 2022-04-15 NOTE — Progress Notes (Incomplete)
Notified by nursing that patient is expressing desire to stop treatments and transition to comfort care.

## 2022-04-15 NOTE — Progress Notes (Addendum)
Charge Nurse and nurse observed that patient was restless and was pulling off trach collar.  Passy muir was placed and patient was able to answer orientation questions appropriately.  Patient states that she is "ready to go" and "wants to be comfortable."  Nurse explained the risks and the patient said she understood.

## 2022-04-16 LAB — CBC
HCT: 22.1 % — ABNORMAL LOW (ref 36.0–46.0)
Hemoglobin: 7 g/dL — ABNORMAL LOW (ref 12.0–15.0)
MCH: 28.8 pg (ref 26.0–34.0)
MCHC: 31.7 g/dL (ref 30.0–36.0)
MCV: 90.9 fL (ref 80.0–100.0)
Platelets: 331 10*3/uL (ref 150–400)
RBC: 2.43 MIL/uL — ABNORMAL LOW (ref 3.87–5.11)
RDW: 14.6 % (ref 11.5–15.5)
WBC: 5.9 10*3/uL (ref 4.0–10.5)
nRBC: 0.3 % — ABNORMAL HIGH (ref 0.0–0.2)

## 2022-04-16 LAB — BASIC METABOLIC PANEL
Anion gap: 8 (ref 5–15)
BUN: 27 mg/dL — ABNORMAL HIGH (ref 8–23)
CO2: 31 mmol/L (ref 22–32)
Calcium: 9 mg/dL (ref 8.9–10.3)
Chloride: 98 mmol/L (ref 98–111)
Creatinine, Ser: 1.43 mg/dL — ABNORMAL HIGH (ref 0.44–1.00)
GFR, Estimated: 39 mL/min — ABNORMAL LOW (ref >60)
Glucose, Bld: 144 mg/dL — ABNORMAL HIGH (ref 70–99)
Potassium: 4.8 mmol/L (ref 3.5–5.1)
Sodium: 137 mmol/L (ref 135–145)

## 2022-04-16 LAB — CBC WITH DIFFERENTIAL/PLATELET
Abs Immature Granulocytes: 0.03 10*3/uL (ref 0.00–0.07)
Basophils Absolute: 0 10*3/uL (ref 0.0–0.1)
Basophils Relative: 0 %
Eosinophils Absolute: 0.2 10*3/uL (ref 0.0–0.5)
Eosinophils Relative: 3 %
HCT: 23.4 % — ABNORMAL LOW (ref 36.0–46.0)
Hemoglobin: 7.3 g/dL — ABNORMAL LOW (ref 12.0–15.0)
Immature Granulocytes: 1 %
Lymphocytes Relative: 27 %
Lymphs Abs: 1.7 10*3/uL (ref 0.7–4.0)
MCH: 28.6 pg (ref 26.0–34.0)
MCHC: 31.2 g/dL (ref 30.0–36.0)
MCV: 91.8 fL (ref 80.0–100.0)
Monocytes Absolute: 0.8 10*3/uL (ref 0.1–1.0)
Monocytes Relative: 13 %
Neutro Abs: 3.6 10*3/uL (ref 1.7–7.7)
Neutrophils Relative %: 56 %
Platelets: 309 10*3/uL (ref 150–400)
RBC: 2.55 MIL/uL — ABNORMAL LOW (ref 3.87–5.11)
RDW: 14.5 % (ref 11.5–15.5)
WBC: 6.4 10*3/uL (ref 4.0–10.5)
nRBC: 0 % (ref 0.0–0.2)

## 2022-04-16 LAB — GLUCOSE, CAPILLARY
Glucose-Capillary: 92 mg/dL (ref 70–99)
Glucose-Capillary: 113 mg/dL — ABNORMAL HIGH (ref 70–99)
Glucose-Capillary: 92 mg/dL (ref 70–99)
Glucose-Capillary: 211 mg/dL — ABNORMAL HIGH (ref 70–99)
Glucose-Capillary: 261 mg/dL — ABNORMAL HIGH (ref 70–99)
Glucose-Capillary: 239 mg/dL — ABNORMAL HIGH (ref 70–99)

## 2022-04-16 MED ORDER — INSULIN ASPART 100 UNIT/ML IJ SOLN: 0.0000 [IU] | INTRAMUSCULAR | Status: DC

## 2022-04-16 MED ORDER — INSULIN ASPART 100 UNIT/ML IJ SOLN
8.0000 [IU] | Freq: Two times a day (BID) | INTRAMUSCULAR | Status: DC
  Administered 2022-04-16 – 2022-04-18 (×4): 8 [IU] via SUBCUTANEOUS

## 2022-04-16 MED ORDER — INSULIN ASPART 100 UNIT/ML IJ SOLN
0.0000 [IU] | Freq: Three times a day (TID) | INTRAMUSCULAR | Status: DC
  Administered 2022-04-16: 5 [IU] via SUBCUTANEOUS
  Administered 2022-04-17: 7 [IU] via SUBCUTANEOUS
  Administered 2022-04-17: 5 [IU] via SUBCUTANEOUS
  Administered 2022-04-18: 7 [IU] via SUBCUTANEOUS

## 2022-04-16 NOTE — Progress Notes (Signed)
PROGRESS NOTE    [name]  XBJ:[id number] DOB: Jul 19, 1951 DOA: 03/23/2022 PCP: Buzzy Han, MD (Inactive)    Brief Narrative:  [name] is a 71 y.o. female with medical history significant of CVA, diabetes mellitus type 2, hypertension, CKD stage IIIb, hyperlipidemia presented to hospital with weakness and lightheadedness for 5 days with fogginess and poor balance.  In the ED, due to her vertigo and progressive dysphagia symptoms, she underwent imaging of the brain which showed left medullary stroke.  Patient had increasing dysphagia and was unable to protect her airway so patient needed frequent suctioning due to her hypoxia and PCCM was consulted.  Patient was then intubated on 03/26/2022 for cardiac arrest likely secondary to aspiration versus hypoxia.  Unasyn was then initiated.  Patient was subsequently extubated on 04/01/22 and reintubated post extubation for stridor.  On 04/03/22, patient underwent tracheostomy placement and on 04/04/22 PEG tube was placed in.  On 04/05/2022 patient had some sinus bradycardia with pauses and was started on dopamine and electrophysiology cardiology was consulted.  On 5/25 patient had temporary pacemaker inserted.  Subsequently, patient was considered stable for transfer out of the ICU.  Assessment and plan Principal Problem:   CVA (cerebral vascular accident) ([place]) Active Problems:   Hypothyroidism   DM2 (diabetes mellitus, type 2) ([place])   HLD (hyperlipidemia)   HTN (hypertension)   Stage 3b chronic kidney disease (CKD) (HCC)   GERD (gastroesophageal reflux disease)   Normocytic anemia   Dysphagia due to recent stroke   CAD (coronary artery disease)   Acute respiratory failure (HCC)   Aspiration pneumonia (HCC)   Pulmonary edema   Sinus arrest   Acute respiratory failure with hypoxia, improved RLL aspiration pneumonia Completed course of antibiotic.  Currently on trach collar.  Has been having more secretions so was  transferred to progressive care unit.  PCCM following. Robinul on Hold due to bradycardia.  On steroid inhalers at this time.  Continue tracheostomy care.  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  Continue aspirin and statins.  PCCM on board.  Seen by neurology during hospitalization.  Continue trach care  Nutrition.  On bolus tube feeding.  Tolerating so far.  Intermittent Symptomatic bradycardia / cardiac pause S/p systolic cardiac arrest x2 due to severe bradycardia Electrophysiology cardiology following and the impression is vagal pauses.  Patient had bradycardia during suctioning.  Had temporary pacemaker which is on hold at this time.  Plan is to continue to monitor.   Essential HTN Continue amlodipine.   Hyperlipidemia Continue crestor, gemfibrozil    Acute blood loss anemia on anemia of chronic disease from tracheal site bleeding  Brilinta on hold.  We will need to resume before discharge.  Latest hemoglobin of 7.3.  Will transfuse for hemoglobin less than 7.3.  Hemodynamically stable.  Continue to hold Brilinta for now.   Hypothyroidism  Continue synthroid    Poorly controlled DM2 with hyperglycemia Latest Hemoglobin A1c is 9.5, continue sliding scale insulin. Glycemic control better.  Last glucose level of 92  Debility, deconditioning.  Patient has been seen by physical therapy and recommend CIR at this time.  Patient would likely benefit from LTAC placement.  Goals of care.  Nursing staff reported that patient had expressed not to continue with aggressive level of care.  We will get palliative care involved.  Communicated with the patient's family about  it.     DVT prophylaxis: enoxaparin (LOVENOX) injection 40 mg Start: 04/06/22 1000 SCD's Start: 03/23/22 1948   Code Status:     Code Status: Full  Code  Disposition: CIR/LTAC  Status is: Inpatient  Remains inpatient appropriate because:  status post trach and PEG, need for rehabilitation, need for frequent suctioning   Family Communication:  Spoke with the patient's daughter on 04/16/2022.  Communicated with her regarding the patient's wishes not to proceed with continued treatment.  We will get palliative care involved discussed about goals of care/family communication and meeting   Consultants:  [facility] Electrophysiology cardiology Cardiology Neurology Palliative care Interventional radiology  Procedures:  Status post tracheostomy placement 04/03/22 Status post PEG tube placement 04/04/22 Temporary pacemaker placement  Antimicrobials:  None currently   Subjective: Today, patient was seen and examined at bedside.  Nursing staff reported that the patient did not wish to proceed with ongoing treatment yesterday and confirmed that this morning.  I have communicated this with the patient's daughter on the phone.  Has been requiring suctioning.      Objective: Vitals:   04/16/22 1000 04/16/22 1033 04/16/22 1200 04/16/22 1300  BP: (!) 135/51  (!) 128/52 (!) 130/52  Pulse: 82 93 77 82  Resp: '14 16 18 '$ (!) 31  Temp:   98.8 F (37.1 C)   TempSrc:      SpO2: 92% 94% 97% 98%  Weight:      Height:        Intake/Output Summary (Last 24 hours) at 04/16/2022 1348 Last data filed at 04/16/2022 1221 Gross per 24 hour  Intake 800 ml  Output 500 ml  Net 300 ml   Filed Weights   04/13/22 0500 04/14/22 0500 04/15/22 0500  Weight: 73.3 kg 71.5 kg 72.2 kg    Physical Examination: Body mass index is 29.11 kg/m.   General:  Average built, not in obvious distress, appears ill and deconditioned and weak HENT:   No scleral pallor or icterus noted. Oral mucosa is moist.  Trach collar in place, Chest:    Diminished breath sounds bilaterally.  Coarse breath sounds noted CVS: S1 &S2 heard. No murmur.  Regular rate and rhythm. Abdomen:  Soft, nontender, nondistended.  Bowel sounds are heard.  PEG tube in place. Extremities: No cyanosis, clubbing or edema.  Peripheral pulses are palpable. Psych: Alert, awake and able to communicate and has been following commands. CNS:  No cranial nerve deficits.  Moves extremities. Skin: Warm and dry.  No rashes noted.  Data Reviewed:   CBC: Recent Labs  Lab 04/10/22 0001 04/11/22 0244 04/14/22 0053 04/15/22 0036 04/15/22 0924 04/16/22 0025 04/16/22 1230  WBC 9.3   < > 7.1 5.9 5.3 5.9 6.4  NEUTROABS 7.6  --   --   --  3.1  --  3.6  HGB 7.5*   < > 7.3* 7.0* 7.1* 7.0* 7.3*  HCT 24.7*   < > 23.9* 22.2* 22.4* 22.1* 23.4*  MCV 93.6   < > 90.9 90.6 90.7 90.9 91.8  PLT 297   < > 304 314 301 331 309   < > = values in this interval not displayed.    Basic Metabolic Panel: Recent Labs  Lab 04/11/22 0244 04/12/22 0543 04/13/22 0108 04/14/22 0053 04/15/22 0036 04/16/22 0025  NA 139 139 138 138 135 137  K 4.2 4.3 4.1 4.5 4.6 4.8  CL 104 104 104 100 97* 98  CO2 '26 28 26 28 28 31  '$ GLUCOSE 239*  110* 175* 171* 217* 144*  BUN 28* 23 24* 24* 24* 27*  CREATININE 1.31* 1.23* 1.21* 1.22* 1.26* 1.43*  CALCIUM 8.5* 8.7* 8.9 8.9 8.8* 9.0  MG 2.2 2.2 2.3 2.3 2.2  --     Liver Function Tests: Recent Labs  Lab 04/10/22 0001 04/14/22 0053 04/15/22 0036  AST '18 26 28  '$ ALT '13 15 17  '$ ALKPHOS 74 80 87  BILITOT 0.2* 0.3 0.1*  PROT 6.7 6.8 7.0  ALBUMIN 2.4* 2.5* 2.5*    Radiology Studies: No results found.    LOS: 24 days    Flora Lipps, MD [facility] Available via Epic secure chat 7am-7pm After these hours, please refer to coverage provider listed on [email or web address] 04/16/2022, 1:48 PM

## 2022-04-17 ENCOUNTER — Inpatient Hospital Stay (HOSPITAL_COMMUNITY): Payer: Medicare Other

## 2022-04-17 LAB — GLUCOSE, CAPILLARY
Glucose-Capillary: 126 mg/dL — ABNORMAL HIGH (ref 70–99)
Glucose-Capillary: 261 mg/dL — ABNORMAL HIGH (ref 70–99)
Glucose-Capillary: 309 mg/dL — ABNORMAL HIGH (ref 70–99)
Glucose-Capillary: 187 mg/dL — ABNORMAL HIGH (ref 70–99)

## 2022-04-17 MED ORDER — ARFORMOTEROL TARTRATE 15 MCG/2ML IN NEBU
15.0000 ug | INHALATION_SOLUTION | Freq: Two times a day (BID) | RESPIRATORY_TRACT | Status: DC
Start: 2022-04-17 — End: 2022-04-18
  Administered 2022-04-17 – 2022-04-18 (×2): 15 ug via RESPIRATORY_TRACT
  Filled 2022-04-17: qty 2

## 2022-04-17 MED ORDER — SODIUM CHLORIDE 3 % IN NEBU
4.0000 mL | INHALATION_SOLUTION | Freq: Two times a day (BID) | RESPIRATORY_TRACT | Status: DC
  Administered 2022-04-17 – 2022-04-18 (×2): 4 mL via RESPIRATORY_TRACT
  Filled 2022-04-17: qty 15

## 2022-04-17 MED ORDER — BUDESONIDE 0.25 MG/2ML IN SUSP
0.2500 mg | Freq: Two times a day (BID) | RESPIRATORY_TRACT | Status: DC
  Administered 2022-04-17 – 2022-04-18 (×2): 0.25 mg via RESPIRATORY_TRACT
  Filled 2022-04-17: qty 2

## 2022-04-17 NOTE — Progress Notes (Signed)
   Inpatient Rehab Admissions Coordinator :  Per therapy recommendations patient was screened for CIR candidacy by Anaid Haney RN MSN. Patient is not yet at a level to tolerate the intensity required to pursue a CIR admit . The CIR admissions team will follow and monitor for progress and place a Rehab Consult order if felt to be appropriate. Please contact me with any questions.  Eilam Shrewsbury RN MSN Admissions Coordinator [phone]  

## 2022-04-17 NOTE — Progress Notes (Signed)
RT came to give pt nebs that were ordered, but was unable due to pt not leaving her ATC mask on. Sats are 90% on RA. RT to continue to monitor.

## 2022-04-17 NOTE — Progress Notes (Signed)
Patient is refusing to wear oxygen and keep her pulse ox on. She is stating that she "does not want this". She said she just wants food. Reached out to MD and palliative care.

## 2022-04-17 NOTE — Progress Notes (Signed)
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 Had another cardiac arrest on 5/19 felt to be related to vagal response. Extubated on 5/20 then re-intubated for stridor, trach on 5/22. Eventually moved out of ICU but moved back to ICU on 6/1 for frequent suctioning needs.    Pertinent  Medical History   Past Medical History:  Diagnosis Date   Anemia    Arteriosclerotic cardiovascular disease    CKD (chronic kidney disease) stage 3, GFR 30-59 ml/min (HCC)    Claudication in peripheral vascular disease (HCC)    CVA (cerebral vascular accident) ([place])    2012 and 2019-affected left side + dysarthria 2/2 severe R ACA and MCA stenosis   Epistaxis 03/29/2022   GERD (gastroesophageal reflux disease)    Hyperlipidemia    declines statins   Hypertension    Hypothyroidism    Type 2 diabetes mellitus ([place]) 2000   Vitamin D deficiency      Significant Hospital Events: Including procedures, antibiotic start and stop dates in addition to other pertinent events   5/11 admitted 5/14 intubated for cardiac arrest, likely 2/2 aspiration vs hypoxia, Unasyn started 5/17 awake. Following commands. Sig difficulty w/ glycemic control. Adjustments made to basal and ss dosing 5/18 awake. VTs low on PSV. Failed initial am SBT (sats 60s; reporting SOB). Got SMPG still no BM 5/19 pulmonary edema on cxr getting lasix. SSE for constipation.  Brief arrest thought 2/2 to vagal response while turning for enema  5/20 extubated, reintubated for post extubation stridor 5/22 trach by CCM 5/23 IR PEG, trach color  5/24 sinus brady with pause. Started  dopa. EP consult 5/25 temp pacemaker insertion 5/26-27 trach collar, downsized to 4-0 5/28- pulled out pacemaker, bradycardic, 6-0 shiley cuffed placed and now on vent, sedating 5/31 had bradycardia with suctioning, 4 second pause. Required vent support 5/30-5/31.   6/1 Moved to ICU for secretion management, did not require mechanical ventilation 6/5 patient intermittently bradycardic to 20s to 30s, minimally responsive, not wearing oxygen saturation monitor, refusing oxygen blow-by.  Eventually, saturation was recorded in the low 70s after probe was placed.  Oxygen was added back.  After several coughs as well as suctioning a large amount of sputum was removed.  Saturations improved to 100%.  Patient was more responsive.  Discussed at length with family who wish for ongoing aggressive care, full code.  Patient too encephalopathic to participate in conversation at my time evaluation.  Has told previous providers she wants to stop treatment and just wants to die earlier in the day.  Interim History / Subjective:  patient intermittently bradycardic to 20s to 30s, minimally responsive, not wearing oxygen saturation monitor, refusing oxygen blow-by.  Eventually, saturation was recorded in the low 70s after probe was placed.  Oxygen was added back.  After several coughs as well as suctioning a large amount of sputum was removed.  Saturations improved to 100%.  Patient was more responsive.  Discussed at length with family who wish for ongoing aggressive care, full code.  Patient too encephalopathic to participate in conversation at my time evaluation.  Has told previous providers she wants  to stop treatment and just wants to die earlier in the day.  Objective   Blood pressure (!) 153/67, pulse 84, temperature 98.9 F (37.2 C), temperature source Axillary, resp. rate (!) 25, height '5\' 2"'$  (1.575 m), weight 72.2 kg, SpO2 99 %.    FiO2 (%):  [35 %-40 %] 40 %   Intake/Output Summary (Last 24 hours) at 04/17/2022  0916 Last data filed at 04/17/2022 0800 Gross per 24 hour  Intake 1674 ml  Output 700 ml  Net 974 ml    Filed Weights   04/13/22 0500 04/14/22 0500 04/15/22 0500  Weight: 73.3 kg 71.5 kg 72.2 kg    Examination:  General: Ill, unstable HENT: NCAT tracheostomy in place PULM: Coarse rhonchorous breath sounds, a lot of sputum via tracheostomy, normal effort CV: RRR, no mgr GI: BS+, soft, nontender, PEG in place MSK: normal bulk and tone Neuro: some eye opening, restless, moves around a lot in bed, doesn't follow commands, very weak voice, dysarthric with speaking valve   Resolved Hospital Problem list   Epistaxis Hyponatremia Bleeding from tracheostomy  AKI on CKD stage III  Assessment & Plan:  Principal Problem:   CVA (cerebral vascular accident) ([place]) Active Problems:   Hypothyroidism   DM2 (diabetes mellitus, type 2) ([place])   HLD (hyperlipidemia)   HTN (hypertension)   Stage 3b chronic kidney disease (CKD) ([place])   GERD (gastroesophageal reflux disease)   Normocytic anemia   Dysphagia due to recent stroke   CAD (coronary artery disease)   Acute respiratory failure (HCC)   Aspiration pneumonia (HCC)   Pulmonary edema   Sinus arrest  Acute respiratory failure with hypoxemia RLL aspiration  Dysphagia from CVA Post extubation stridor S/p trach/PEG With intermittent significant desaturations of bradycardia largely driven I think by mucous plug, secretions.  Also her nonadherence to oxygen therapy etc. could be playing a role.  For now, ongoing aggressive care.  She cannot refuse interventions.  Unless, she is in her right state of mind, and expresses wishes that she wants to stop and be comfort care.  I think overall a DNR would be reasonable but family wishes for full scope of care, full code after discussion morning 6/5. Plan: Continue brovana/pulmicort given smoking history to see if that will help with secretions Continue prn albuterol Continue trach collar  oxygen Continue tube feedings per PEG tube Continue suctioning per unit routine Add hypertonic saline nebulizer 6/5 given thick secretions and intermittent mucous plugging Chest x-ray 6/5 reviewed and interpreted as clear lungs bilaterally, slightly improved from prior 04/11/2022  Rest of care per primary service  PCCM will see weekly or sooner if needed  Best Practice (right click and "Reselect all SmartList Selections" daily)   Per [facility]  Labs   CBC: Recent Labs  Lab 04/14/22 0053 04/15/22 0036 04/15/22 0924 04/16/22 0025 04/16/22 1230  WBC 7.1 5.9 5.3 5.9 6.4  NEUTROABS  --   --  3.1  --  3.6  HGB 7.3* 7.0* 7.1* 7.0* 7.3*  HCT 23.9* 22.2* 22.4* 22.1* 23.4*  MCV 90.9 90.6 90.7 90.9 91.8  PLT 304 314 301 331 309     Basic Metabolic Panel: Recent Labs  Lab 04/11/22 0244 04/12/22 0543 04/13/22 0108 04/14/22 0053 04/15/22 0036 04/16/22 0025  NA 139 139 138 138 135 137  K 4.2 4.3 4.1 4.5 4.6 4.8  CL 104 104 104 100 97* 98  CO2 '26 28 26 28 28 31  '$ GLUCOSE 239* 110* 175* 171* 217* 144*  BUN 28* 23 24* 24* 24* 27*  CREATININE 1.31* 1.23* 1.21* 1.22* 1.26* 1.43*  CALCIUM 8.5* 8.7* 8.9 8.9 8.8* 9.0  MG 2.2 2.2 2.3 2.3 2.2  --     GFR: Estimated Creatinine Clearance: 34 mL/min (A) (by C-G formula based on SCr of 1.43 mg/dL (H)). Recent Labs  Lab 04/15/22 0036 04/15/22 0924 04/16/22 0025 04/16/22 1230  WBC 5.9 5.3 5.9 6.4     Liver Function Tests: Recent Labs  Lab 04/14/22 0053 04/15/22 0036  AST 26 28  ALT 15 17  ALKPHOS 80 87  BILITOT 0.3 0.1*  PROT 6.8 7.0  ALBUMIN 2.5* 2.5*    No results for input(s): LIPASE, AMYLASE in the last 168 hours. No results for input(s): AMMONIA in the last 168 hours.  ABG    Component Value Date/Time   PHART 7.367 04/09/2022 2345   PCO2ART 50.2 (H) 04/09/2022 2345   PO2ART 79 (L) 04/09/2022 2345   HCO3 28.9 (H) 04/09/2022 2345   TCO2 30 04/09/2022 2345   ACIDBASEDEF 2.0 03/26/2022 1215   O2SAT 95 04/09/2022  2345      Coagulation Profile: No results for input(s): INR, PROTIME in the last 168 hours.  Cardiac Enzymes: No results for input(s): CKTOTAL, CKMB, CKMBINDEX, TROPONINI in the last 168 hours.  HbA1C: Hemoglobin A1C  Date/Time Value Ref Range Status  01/01/2019 01:43 PM 9.5 (A) 4.0 - 5.6 % Final  09/25/2018 03:11 PM 7.7 (A) 4.0 - 5.6 % Final   HbA1c POC (<> result, manual entry)  Date/Time Value Ref Range Status  03/25/2019 10:10 AM >14.0 (A) 4.0 - 5.6 % Final   Hgb A1c MFr Bld  Date/Time Value Ref Range Status  03/24/2022 02:14 AM 9.5 (H) 4.8 - 5.6 % Final    Comment:    (NOTE) Pre diabetes:          5.7%-6.4%  Diabetes:              >6.4%  Glycemic control for   <7.0% adults with diabetes   02/16/2018 02:00 AM 8.9 (H) 4.8 - 5.6 % Final    Comment:    (NOTE) Pre diabetes:          5.7%-6.4% Diabetes:              >6.4% Glycemic control for   <7.0% adults with diabetes     CBG: Recent Labs  Lab 04/16/22 0735 04/16/22 1154 04/16/22 1644 04/16/22 2212 04/17/22 0610  GLUCAP 92 211* 261* 239* 126*     Critical care time:     CRITICAL CARE Performed by: Bonna Gains Brittini Brubeck   Total critical care time: 35 minutes  Critical care time was exclusive of separately billable procedures and treating other patients.  Critical care was necessary to treat or prevent imminent or life-threatening deterioration.  Critical care was time spent personally by me on the following activities: development of treatment plan with patient and/or surrogate as well as nursing, discussions with consultants, evaluation of patient's response to treatment, examination of patient, obtaining history from patient or surrogate, ordering and performing treatments and interventions, ordering and review of laboratory studies, ordering and review of radiographic studies, pulse oximetry and re-evaluation of patient's condition.  Lanier Clam, MD  PCCM See Amion for contact  info After 7:00 pm call Elink  [phone]

## 2022-04-17 NOTE — Progress Notes (Signed)
Inpatient Diabetes Program Recommendations  AACE/ADA: New Consensus Statement on Inpatient Glycemic Control (2015)  Target Ranges:  Prepandial:   less than 140 mg/dL      Peak postprandial:   less than 180 mg/dL (1-2 hours)      Critically ill patients:  140 - 180 mg/dL   Lab Results  Component Value Date   GLUCAP 126 (H) 04/17/2022   HGBA1C 9.5 (H) 03/24/2022   Diabetes history: Type 2 DM Outpatient Diabetes medications: Lantus 26 units BID Current orders for Inpatient glycemic control: Novolog 0-9 units TID, Novolog 8 units BID, Semglee 35 units QD Jevity 474 ml BID  Inpatient Diabetes Program Recommendations:    Consider increasing Novolog 12 units BID (0800 & 1400).  Thanks, Bronson Curb, MSN, RNC-OB Diabetes Coordinator [phone] (8a-5p)

## 2022-04-17 NOTE — Progress Notes (Signed)
Physical Therapy Treatment Patient Details Name: [name] MRN: [id number] DOB: 06-08-1951 Today's Date: 04/17/2022   History of Present Illness Pt is a 71 y.o. female admitted 03/23/22 with dizziness, balance issues, hoarse voice. 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 Code blue 5/14, pt requiring CPR and intubated. Trach placed 5/22. PEG placed 5/23. Temporary pacer placed 5/25; pt pulled out pacer 5/28; plan to hold on permanent pacemaker implant. Pt back on vent support 5/28-5/31 due to hypoxia, bradycardia. Course complicated by deliriuim. PMH includes HTN, DM, CKD 3, CVA.    PT Comments    Patient progressing slowly towards PT goals. Session focused on functional mobility, transfers and sitting balance. Worked on finding midline orientation using long mirror for feedback. Pt responded well to visual cues and able to self correct and initiate balance/midline but not sustain esp with dual tasking. Attempted standing from EOB however pt requiring Max A of 2 resulting in worsening pushing towards left. Might do well standing in EVA using mirror for feedback if pt willing, which she was not today. Tolerated squat pivot transfer to chair with Max A of 2. Recommend maxi sky transfer back to bed. VSS on 40% FI02 10L trach collar. Will continue to follow and progress as tolerated.   Recommendations for follow up therapy are one component of a multi-disciplinary discharge planning process, led by the attending physician.  Recommendations may be updated based on patient status, additional functional criteria and insurance authorization.  Follow Up Recommendations  Acute inpatient rehab (3hours/day)     Assistance Recommended at Discharge Frequent or constant Supervision/Assistance  Patient can return home with the following Two people to help with walking and/or transfers;A lot of help with bathing/dressing/bathroom;Assist for transportation;Help with stairs or ramp for entrance;Assistance  with cooking/housework;Direct supervision/assist for medications management;Direct supervision/assist for financial management   Equipment Recommendations  Rolling walker (2 wheels);BSC/3in1;Wheelchair (measurements PT);Wheelchair cushion (measurements PT);Hospital bed;Other (comment)    Recommendations for Other Services       Precautions / Restrictions Precautions Precautions: Fall;Other (comment) Precaution Comments: trach, PEG, pushing syndrome Restrictions Weight Bearing Restrictions: No     Mobility  Bed Mobility Overal bed mobility: Needs Assistance Bed Mobility: Supine to Sit     Supine to sit: Max assist, HOB elevated, +2 for safety/equipment     General bed mobility comments: Initiating bringing RLE to EOB, assist with LLE, trunk and scooting bottom to EOB, posterior lean and pushing towards left.    Transfers Overall transfer level: Needs assistance Equipment used:  (back of recliner) Transfers: Sit to/from Stand, Bed to chair/wheelchair/BSC Sit to Stand: Max assist, +2 physical assistance, From elevated surface, +2 safety/equipment     Squat pivot transfers: Max assist, +2 safety/equipment, +2 physical assistance     General transfer comment: Stood from EOB x1 with cues for hand placement on back of recliner and midline posture, pushing towards left in standing. Attempted to stand in EVA with mirror however pt declining. Squat pivot transfer to chair towards left Max A of 2.    Ambulation/Gait               General Gait Details: unable   Stairs             Wheelchair Mobility    Modified Rankin (Stroke Patients Only) Modified Rankin (Stroke Patients Only) Pre-Morbid Rankin Score: No symptoms Modified Rankin: Severe disability     Balance Overall balance assessment: Needs assistance Sitting-balance support: Feet supported, Single extremity supported, Bilateral upper extremity supported Sitting  balance-Leahy Scale: Poor Sitting  balance - Comments: Min A-total A sitting EOB, pushing towards left. Able to find midline orientation using mirror for visual cues and verbal cues wth repetition. Fatigues quickly when dual taskin resulting in worsening left lateral lean/pushing. Able to maintain balance leaning on right elbow for support. Postural control: Left lateral lean Standing balance support: During functional activity Standing balance-Leahy Scale: Zero Standing balance comment: Assist of 2 to stand using back of recliner for support, difficulty gettng upright and pushing noted with standing.                            Cognition Arousal/Alertness: Awake/alert, Lethargic Behavior During Therapy: Flat affect, Restless, Impulsive Overall Cognitive Status: Impaired/Different from baseline Area of Impairment: Attention, Following commands, Safety/judgement, Awareness, Problem solving                   Current Attention Level: Sustained   Following Commands: Follows one step commands with increased time, Follows one step commands inconsistently Safety/Judgement: Decreased awareness of safety, Decreased awareness of deficits Awareness: Intellectual Problem Solving: Slow processing, Difficulty sequencing, Requires verbal cues, Requires tactile cues, Decreased initiation General Comments: Attempting to follow majority of simple commands with repetition. "I don't want to stand." Mumbling soft speech dificult to understand with PMV. Poor midline orientation awareness, pusher towards left. Does well with visual cues using mirror. Eyes remained closed for most of session however able to open them with cues. NOt able to sustain attention for long periods.        Exercises      General Comments General comments (skin integrity, edema, etc.): Pt on 40% FI02 10L and SP02 in 90s during session. Placed posey vest on patient in chair per RN request. Pt refused to wear socks, taking off sock on LLE with increased  time.      Pertinent Vitals/Pain Pain Assessment Pain Assessment: Faces Faces Pain Scale: No hurt    Home Living                          Prior Function            PT Goals (current goals can now be found in the care plan section) Progress towards PT goals: Progressing toward goals (slowly)    Frequency    Min 4X/week      PT Plan Current plan remains appropriate    Co-evaluation PT/OT/SLP Co-Evaluation/Treatment: Yes Reason for Co-Treatment: Complexity of the patient's impairments (multi-system involvement);For patient/therapist safety;To address functional/ADL transfers;Necessary to address cognition/behavior during functional activity PT goals addressed during session: Mobility/safety with mobility;Balance;Strengthening/ROM        AM-PAC PT "6 Clicks" Mobility   Outcome Measure  Help needed turning from your back to your side while in a flat bed without using bedrails?: A Lot Help needed moving from lying on your back to sitting on the side of a flat bed without using bedrails?: A Lot Help needed moving to and from a bed to a chair (including a wheelchair)?: Total Help needed standing up from a chair using your arms (e.g., wheelchair or bedside chair)?: Total Help needed to walk in hospital room?: Total Help needed climbing 3-5 steps with a railing? : Total 6 Click Score: 8    End of Session Equipment Utilized During Treatment: Oxygen;Gait belt Activity Tolerance: Patient limited by fatigue;Patient tolerated treatment well Patient left: in chair;with call bell/phone within reach;with restraints  reapplied;with nursing/sitter in room Nurse Communication: Mobility status;Need for lift equipment PT Visit Diagnosis: Other symptoms and signs involving the nervous system (R29.898);Dizziness and giddiness (R42);Hemiplegia and hemiparesis;Other abnormalities of gait and mobility (R26.89);Unsteadiness on feet (R26.81);Muscle weakness (generalized)  (M62.81) Hemiplegia - Right/Left: Right Hemiplegia - dominant/non-dominant: Dominant Hemiplegia - caused by: Cerebral infarction     Time: 0930-1001 PT Time Calculation (min) (ACUTE ONLY): 31 min  Charges:  $Neuromuscular Re-education: 8-22 mins                     Marisa Severin, PT, DPT Acute Rehabilitation Services Secure chat preferred Office [place] 04/17/2022, 10:16 AM

## 2022-04-17 NOTE — Progress Notes (Signed)
PROGRESS NOTE    [name]  MKL:[id number] DOB: 03-08-1951 DOA: 03/23/2022 PCP: Buzzy Han, MD (Inactive)    Brief Narrative:  [name] is a 71 y.o. female with medical history significant of CVA, diabetes mellitus type 2, hypertension, CKD stage IIIb, hyperlipidemia presented to the hospital with weakness and lightheadedness for 5 days with fogginess and poor balance.  In the ED, due to her vertigo and progressive dysphagia symptoms, she underwent imaging of the brain which showed left medullary stroke.  Patient had increasing dysphagia and was unable to protect her airway and patient needed frequent suctioning due to her hypoxia, PCCM was consulted.  Patient was intubated on 03/26/2022 for cardiac arrest likely secondary to aspiration versus hypoxia.  Unasyn was then initiated.  Patient was subsequently extubated on 04/01/22 and reintubated post extubation for stridor.  On 04/03/22, patient underwent tracheostomy placement and on 04/04/22 PEG tube was placed in.  On 04/05/2022, patient had some sinus bradycardia with pauses and was started on dopamine and electrophysiology cardiology was consulted.  On 04/06/22 patient had temporary pacemaker inserted.  Subsequently, patient was considered stable for transfer out of the ICU.  After transfer from the ICU, patient continues to have increasing secretions over the tracheostomy tube.  Cardiology does not recommend a pacemaker placement since episodes of bradycardia were thought to be secondary to vagal stimulation.  On 04/16/2022 patient has expressed wishes not to be escalated on the treatment.  Palliative care has been consulted to address goals of care.  Assessment and plan Principal Problem:   CVA (cerebral vascular accident) ([place]) Active Problems:   Hypothyroidism   DM2 (diabetes mellitus, type 2) ([place])   HLD (hyperlipidemia)   HTN (hypertension)   Stage 3b chronic kidney disease (CKD) (HCC)   GERD (gastroesophageal  reflux disease)   Normocytic anemia   Dysphagia due to recent stroke   CAD (coronary artery disease)   Acute respiratory failure (HCC)   Aspiration pneumonia (HCC)   Pulmonary edema   Sinus arrest   Acute respiratory failure with hypoxia,  RLL aspiration pneumonia Completed course of antibiotic.  Currently on trach collar.  PCCM following the patient for trach care with frequent suctioning.  Patient tried to take off the oxygen this morning and has expressed not to undergo aggressive treatment.  Currently back on oxygen in suctioning.. Robinul on Hold due to bradycardia.  On steroid inhalers at this time.  Continue tracheostomy care.  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  On aspirin and statins.  PCCM on board.  Seen by neurology during hospitalization.  Continue trach care  Nutrition.  On bolus tube feeding.  Tolerating so far.  Intermittent Symptomatic bradycardia / cardiac pause S/p systolic cardiac arrest x2 due to severe bradycardia Electrophysiology cardiology following and the impression is vagal pauses.  Patient had bradycardia during suctioning.  Patient had pulled out oxygen this morning and had bradycardia which has improved at this time.  Had temporary pacemaker which is on hold at this time.  Plan is to continue to monitor.   Essential HTN Continue amlodipine.   Hyperlipidemia Continue crestor, gemfibrozil    Acute blood loss anemia on anemia of chronic disease from tracheal site bleeding  Hemoglobin at 7.3. .  Continue to hold Brilinta for now.   Hypothyroidism  Continue synthroid    Poorly controlled diabetes mellitus type 2 with hyperglycemia  Latest Hemoglobin A1c is 9.5, continue sliding scale insulin. Glycemic control okay.  Debility, deconditioning.  Patient has been seen by physical  therapy, plan is LTAC placement.  Goals of care.  Nursing staff has reported that patient had expressed not to continue with aggressive level of care.  Patient tried to remove the oxygen this morning.  Palliative care has been involved.     DVT prophylaxis: enoxaparin (LOVENOX) injection 40 mg Start: 04/06/22 1000 SCD's Start: 03/23/22 1948   Code Status:     Code Status: Full Code  Disposition: LTAC  Status is: Inpatient  Remains inpatient appropriate because:  status post trach and PEG, need for rehabilitation, need for frequent suctioning   Family Communication:  Spoke with the patient's daughter on 04/16/2022 and spoke and updated her about the patient's desires.  Palliative care has been consulted to discuss about further goals of care.  Consultants:  PCCM Electrophysiology cardiology Cardiology Neurology Palliative care Interventional radiology  Procedures:  Status post tracheostomy placement 04/03/22 Status post PEG tube placement 04/04/22 Temporary pacemaker placement  Antimicrobials:  None currently  Subjective:  Today, patient was seen and examined at bedside.  Nursing staff reported that she had pulled out oxygen this morning and had significant bradycardia after that.  Has expressed that she does not want to continue to do all this.  Patient denies any pain, nausea, vomiting.  Objective: Vitals:   04/17/22 0700 04/17/22 0740 04/17/22 0741 04/17/22 0800  BP: 126/70 126/70  (!) 153/67  Pulse: 85 86 84 84  Resp: (!) 21 (!) 21 (!) 29 (!) 25  Temp:    98.9 F (37.2 C)  TempSrc:    Axillary  SpO2: 97% 99% 98% 99%  Weight:      Height:        Intake/Output Summary (Last 24 hours) at 04/17/2022 0925 Last data filed at 04/17/2022 0800 Gross per 24 hour  Intake 1674 ml  Output 700 ml  Net 974 ml   Filed Weights   04/13/22 0500 04/14/22 0500 04/15/22 0500  Weight: 73.3 kg 71.5 kg 72.2 kg    Physical Examination: Body mass index is 29.11 kg/m.   General:   Average built, not in obvious distress, appears ill, deconditioned and weak, HENT:   No scleral pallor or icterus noted. Oral mucosa is moist.  Chest:   Diminished breath sounds bilaterally.  Coarse breath sounds noted. CVS: S1 &S2 heard. No murmur.  Regular rate and rhythm. Abdomen: Soft, nontender, nondistended.  Bowel sounds are heard.  To be in place. Extremities: No cyanosis, clubbing or edema.  Peripheral pulses are palpable. Psych: Alert, awake and mildly Communicative, able to follow commands, CNS:  Moves extremities. Skin: Warm and dry.  No rashes noted.  Data Reviewed:   CBC: Recent Labs  Lab 04/14/22 0053 04/15/22 0036 04/15/22 0924 04/16/22 0025 04/16/22 1230  WBC 7.1 5.9 5.3 5.9 6.4  NEUTROABS  --   --  3.1  --  3.6  HGB 7.3* 7.0* 7.1* 7.0* 7.3*  HCT 23.9* 22.2* 22.4* 22.1* 23.4*  MCV 90.9 90.6 90.7 90.9 91.8  PLT 304 314 301 331 341    Basic Metabolic Panel: Recent Labs  Lab 04/11/22 0244 04/12/22 0543 04/13/22 0108 04/14/22 0053 04/15/22 0036 04/16/22 0025  NA 139 139 138 138 135 137  K 4.2 4.3 4.1 4.5 4.6 4.8  CL 104 104 104 100 97* 98  CO2 '26 28 26 28 28 31  '$ GLUCOSE 239* 110* 175* 171* 217* 144*  BUN 28* 23 24* 24* 24* 27*  CREATININE 1.31* 1.23* 1.21* 1.22* 1.26* 1.43*  CALCIUM 8.5* 8.7* 8.9 8.9 8.8* 9.0  MG 2.2 2.2 2.3 2.3 2.2  --     Liver Function Tests: Recent Labs  Lab 04/14/22 0053 04/15/22 0036  AST 26 28  ALT 15 17  ALKPHOS 80 87  BILITOT 0.3 0.1*  PROT 6.8 7.0  ALBUMIN 2.5* 2.5*    Radiology Studies: DG CHEST PORT 1 VIEW  Result Date: 04/17/2022 CLINICAL DATA:  30076; hypoxemia EXAM: PORTABLE CHEST 1 VIEW COMPARISON:  Chest radiograph dated Apr 11, 2022 FINDINGS: The cardiomediastinal silhouette is unchanged in contour.Tracheostomy. Atherosclerotic calcifications. Cardiac loop recorder. No pleural effusion. No pneumothorax. No acute pleuroparenchymal abnormality. Visualized abdomen is unremarkable. IMPRESSION: No acute  cardiopulmonary abnormality. Electronically Signed   By: Valentino Saxon M.D.   On: 04/17/2022 08:54      LOS: 25 days    Flora Lipps, MD [facility] Available via Epic secure chat 7am-7pm After these hours, please refer to coverage provider listed on [email or web address] 04/17/2022, 9:25 AM

## 2022-04-17 NOTE — Progress Notes (Signed)
Occupational Therapy Treatment Patient Details Name: [name] MRN: [id number] DOB: January 09, 1951 Today's Date: 04/17/2022   History of present illness Pt is a 71 y.o. female admitted 03/23/22 with dizziness, balance issues, hoarse voice. 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 Code blue 5/14, pt requiring CPR and intubated. Trach placed 5/22. PEG placed 5/23. Temporary pacer placed 5/25; pt pulled out pacer 5/28; plan to hold on permanent pacemaker implant. Pt back on vent support 5/28-5/31 due to hypoxia, bradycardia. Course complicated by deliriuim. PMH includes HTN, DM, CKD 3, CVA.   OT comments  This 71 yo female seen today with PT to work on sit<>stand without the [place]. Pt with pushing heavily to the left in sitting today, but able to work on self correcting with mirror in front of her and VCs. Attempted to stand x1 with recliner back in front of her (she was trying to push with her RLE and could not come all the way up). After working on sitting balance with mirror wanted to try standing with [place] walker and mirror in front of her but she plainly said "I don't want to stand". She will continue to benefit from acute OT with follow up on AIR.   Recommendations for follow up therapy are one component of a multi-disciplinary discharge planning process, led by the attending physician.  Recommendations may be updated based on patient status, additional functional criteria and insurance authorization.    Follow Up Recommendations  Acute inpatient rehab (3hours/day)    Assistance Recommended at Discharge Frequent or constant Supervision/Assistance  Patient can return home with the following  Two people to help with walking and/or transfers;Two people to help with bathing/dressing/bathroom;Direct supervision/assist for medications management;Direct supervision/assist for financial management;Assist for transportation;Help with stairs or ramp for entrance;Assistance with feeding;Assistance with  cooking/housework   Equipment Recommendations  Other (comment) (TBD next venue)    Recommendations for Other Services Rehab consult    Precautions / Restrictions Precautions Precautions: Fall;Other (comment) Precaution Comments: trach, PEG, pushing syndrome Restrictions Weight Bearing Restrictions: No       Mobility Bed Mobility Overal bed mobility: Needs Assistance Bed Mobility: Supine to Sit Rolling: Total assist, +2 for physical assistance, +2 for safety/equipment   Supine to sit: Max assist, HOB elevated, +2 for safety/equipment Sit to supine: Total assist   General bed mobility comments: Initiating bringing RLE to EOB, assist with LLE, trunk and scooting bottom to EOB, posterior lean and pushing towards left.    Transfers Overall transfer level: Needs assistance   Transfers: Sit to/from Stand, Bed to chair/wheelchair/BSC Sit to Stand: Max assist, +2 physical assistance, From elevated surface, +2 safety/equipment Stand pivot transfers: Mod assist Squat pivot transfers: Max assist, +2 safety/equipment, +2 physical assistance Step pivot transfers: Mod assist, +2 safety/equipment    Lateral/Scoot Transfers: Mod assist General transfer comment: Stood from EOB x1 with cues for hand placement on back of recliner and midline posture, pushing towards left in standing. Attempted to stand in EVA with mirror however pt declining. Squat pivot transfer to chair towards left Max A of 2.     Balance Overall balance assessment: Needs assistance Sitting-balance support: Feet supported, Single extremity supported, Bilateral upper extremity supported Sitting balance-Leahy Scale: Poor Sitting balance - Comments: Min A-total A sitting EOB, pushing towards left. Able to find midline orientation using mirror for visual cues and verbal cues wth repetition. Fatigues quickly when dual taskin resulting in worsening left lateral lean/pushing. Able to maintain balance leaning on right elbow for  support. Postural  control: Left lateral lean Standing balance support: During functional activity Standing balance-Leahy Scale: Zero Standing balance comment: Assist of 2 to stand using back of recliner for support, difficulty gettng upright and pushing noted with standing.                           ADL either performed or assessed with clinical judgement   ADL Overall ADL's : Needs assistance/impaired     Grooming: Wash/dry face;Set up;Supervision/safety Grooming Details (indicate cue type and reason): total A sitting balance when attempting to wash face with left lateral lean                 Toilet Transfer: Total assistance;+2 for physical assistance;Squat-pivot Toilet Transfer Details (indicate cue type and reason): Simulated bed to recliner on her left, pt reaching for recliner with RUE with no regard to if I was not there she would have sent herself right into the floor.                Extremity/Trunk Assessment Upper Extremity Assessment Upper Extremity Assessment: RUE deficits/detail;LUE deficits/detail RUE Deficits / Details: pushing with RUE in sitting at EOB,moves it more spontaneously than LUE LUE Deficits / Details: Can raise arm off of bed when asked            Vision Baseline Vision/History: 0 No visual deficits Ability to See in Adequate Light: 1 Impaired Additional Comments: Pt reports no diplopia today, but tended to keep eyes shut 90% of session, eyes are still disconjugate. Would not attempt to reach for my red pen for me when asked to for me to see if she would hit or miss it.The most she had her eyes open was when we had the mirror in front of her while seated to try and work on sitting balance (worked well with added verbal cues as well)          Cognition Arousal/Alertness: Awake/alert, Lethargic Behavior During Therapy: Flat affect, Restless, Impulsive Overall Cognitive Status: Impaired/Different from baseline Area of Impairment:  Attention, Following commands, Safety/judgement, Awareness, Problem solving                   Current Attention Level: Sustained   Following Commands: Follows one step commands with increased time, Follows one step commands inconsistently Safety/Judgement: Decreased awareness of safety, Decreased awareness of deficits Awareness: Intellectual Problem Solving: Slow processing, Difficulty sequencing, Requires verbal cues, Requires tactile cues, Decreased initiation General Comments: Attempting to follow majority of simple commands with repetition. "I don't want to stand." Mumbling soft speech dificult to understand with PMV. Poor midline orientation awareness, pusher towards left. Does well with visual cues using mirror. Eyes remained closed for most of session however able to open them with cues. NOt able to sustain attention for long periods.              General Comments Pt on 40% FI02 10L and SP02 in 90s during session. Placed posey vest on patient in chair per RN request. Pt refused to wear socks, taking off sock on LLE with increased time.    Pertinent Vitals/ Pain       Pain Assessment Pain Assessment: Faces Faces Pain Scale: No hurt         Frequency  Min 3X/week        Progress Toward Goals  OT Goals(current goals can now be found in the care plan section)  Progress towards OT goals: Not progressing toward  goals - comment (needed increased assist for sitting balance today--but did do better with mirror in front of her)  Acute Rehab OT Goals OT Goal Formulation: Patient unable to participate in goal setting Time For Goal Achievement: 04/24/22 Potential to Achieve Goals: Good  Plan Discharge plan remains appropriate    Co-evaluation    PT/OT/SLP Co-Evaluation/Treatment: Yes Reason for Co-Treatment: Complexity of the patient's impairments (multi-system involvement);Necessary to address cognition/behavior during functional activity;For patient/therapist  safety;To address functional/ADL transfers PT goals addressed during session: Mobility/safety with mobility;Balance;Strengthening/ROM OT goals addressed during session: Strengthening/ROM;ADL's and self-care      AM-PAC OT "6 Clicks" Daily Activity     Outcome Measure   Help from another person eating meals?: Total Help from another person taking care of personal grooming?: A Lot Help from another person toileting, which includes using toliet, bedpan, or urinal?: Total Help from another person bathing (including washing, rinsing, drying)?: A Lot Help from another person to put on and taking off regular upper body clothing?: Total Help from another person to put on and taking off regular lower body clothing?: Total 6 Click Score: 8    End of Session Equipment Utilized During Treatment: Gait belt  OT Visit Diagnosis: Unsteadiness on feet (R26.81);Dizziness and giddiness (R42)   Activity Tolerance Patient tolerated treatment well   Patient Left in chair;with call bell/phone within reach;with chair alarm set   Nurse Communication Mobility status;Need for lift equipment        Time: 3536-1443 OT Time Calculation (min): 33 min  Charges: OT General Charges $OT Visit: 1 Visit OT Treatments $Self Care/Home Management : 8-22 mins  Golden Circle, OTR/L Acute Rehab Services [facility] [phone] Office [phone]    Almon Register 04/17/2022, 10:55 AM

## 2022-04-17 NOTE — Progress Notes (Signed)
Palliative Medicine Progress Note   Patient Name: [name]       Date: 04/17/2022 DOB: 1951-01-16  Age: 71 y.o. MRN#: [id number] Attending Physician: Flora Lipps, MD Primary Care Physician: Buzzy Han, MD (Inactive) Admit Date: 03/23/2022   HPI/Patient Profile: 71 y.o. female  with past medical history of prior CVA, DM 2 (poorly controlled), chronic kidney disease stage IIIb, hypertension, and hyperlipidemia.  She presented to the emergency department on 03/23/2022 with weakness and dizziness.  She was found to have an acute left medullary stroke and was admitted to the hospitalist service with stroke team following.   5/14 - she went into cardiac arrest and was intubated likely secondary to aspiration event 5/19 - she had another brief cardiac arrest thought secondary to vagal response while administering an enema 5/20 - extubated, and reintubated for stridor 5/22 - trach 5/23 - PEG 5/24 - sinus bradycardia with pauses; started dopamine 5/25 - temporary pacemaker insertion 5/26-5/27- tolerating trach collar 5/28 - pulled out pacemaker wires, but able to maintain normal HR 6/1 - transferred back to ICU for secretion management    Subjective: Patient's RN reached out to PMT due to patient refusing to wear oxygen and stating she "does not want this".   11:15 - I went to see patient at bedside. She is OOB to recliner. Copious respiratory secretions noted. Very difficult to understand patient's speech. Eventually able to understand patient does not want to wear oxygen because "it is cold".   13:30 - I had a long discussion with patient's daughter [name] by phone. Ongoing conversation was had regarding the seriousness of patient's current medical condition. We discussed  her ongoing need for supplemental oxygen and excessive respiratory secretions. We discussed her significant dysphagia, ongoing risk for aspiration, and dependence on artifical feeding. We also discussed her intermittent refusal of care.   [name] understands all this but remains hopeful that her mother will improve over time (with therapy) to the point she would not need the trach or feeding tube (PEG). She shares that her mother would not want a trach or PEG long-term.   We discussed code status. I expressed concern about Ms. Rozak being able to endure and survive another resuscitation event. Strongly recommended DNR/DNI status, understanding evidenced based poor outcomes in similar hospitalized patients. [name] verbalizes understanding and will consider,  but is not willing to make that decisions at this time.   [name] asks me about comfort care in case it "comes to that".  The difference between full scope medical intervention and comfort care was reviewed. Provided education on the concept of comfort care - that it involves de-escalating and stopping full scope medical interventions, allowing a natural course to occur. Discussed that the goal is comfort and dignity rather than cure/prolonging life. Encouraged [name] to consider at what point they would want to stop full scope medical interventions, keeping in mind the concept of quality of life.   Discussed the importance of continued conversation with family and the medical team regarding overall plan of care and treatment options, ensuring decisions are within the context of the patients values and GOCs.   Objective:  Physical Exam Constitutional:      General: She is not in acute distress.    Appearance: She is ill-appearing.  Cardiovascular:     Rate and Rhythm: Normal rate.  Pulmonary:     Comments: Trach Excessive respiratory secretions Neurological:     Mental Status: She is alert.     Comments: Follows commands            Vital  Signs: BP (!) 125/46   Pulse 81   Temp 99.1 F (37.3 C) (Oral)   Resp (!) 26   Ht '5\' 2"'$  (1.575 m)   Wt 72.2 kg   SpO2 95%   BMI 29.11 kg/m  SpO2: SpO2: 95 % O2 Device: O2 Device: Tracheostomy Collar O2 Flow Rate: O2 Flow Rate (L/min): 10 L/min   LBM: Last BM Date : 04/17/22     Palliative Medicine Assessment & Plan   Assessment: Principal Problem:   CVA (cerebral vascular accident) ([place]) Active Problems:   Hypothyroidism   DM2 (diabetes mellitus, type 2) ([place])   HLD (hyperlipidemia)   HTN (hypertension)   Stage 3b chronic kidney disease (CKD) ([facility])   GERD (gastroesophageal reflux disease)   Normocytic anemia   Dysphagia due to recent stroke   CAD (coronary artery disease)   Acute respiratory failure ([facility])   Aspiration pneumonia ([facility])   Pulmonary edema   Sinus arrest    Recommendations/Plan: Full code full scope with ongoing discussions Daughter is hopeful for improvement PMT will continue to follow and support   Prognosis:  Unable to determine  Discharge Planning: To Be Determined    Thank you for allowing the Palliative Medicine Team to assist in the care of this patient.   Greater than 50%  of this time was spent counseling and coordinating care related to the above assessment and plan.  Total time: 70 minutes   Lavena Bullion, NP Palliative Medicine   Please contact Palliative Medicine Team phone at [phone] for questions and concerns.  For individual provider, see AMION.

## 2022-04-18 ENCOUNTER — Other Ambulatory Visit (HOSPITAL_COMMUNITY): Payer: Self-pay

## 2022-04-18 ENCOUNTER — Inpatient Hospital Stay (HOSPITAL_COMMUNITY): Payer: Medicare Other

## 2022-04-18 ENCOUNTER — Inpatient Hospital Stay
Admission: RE | Admit: 2022-04-18 | Discharge: 2022-05-05 | Disposition: A | Discharge status: Rehabilitation Facility | Payer: Medicare Other | Source: Other Acute Inpatient Hospital | Attending: Infectious Diseases | Admitting: Infectious Diseases

## 2022-04-18 DIAGNOSIS — J9621 Acute and chronic respiratory failure with hypoxia: Secondary | ICD-10-CM

## 2022-04-18 DIAGNOSIS — I69391 Dysphagia following cerebral infarction: Secondary | ICD-10-CM

## 2022-04-18 DIAGNOSIS — I63411 Cerebral infarction due to embolism of right middle cerebral artery: Secondary | ICD-10-CM | POA: Diagnosis present

## 2022-04-18 DIAGNOSIS — Z930 Tracheostomy status: Secondary | ICD-10-CM

## 2022-04-18 DIAGNOSIS — R040 Epistaxis: Secondary | ICD-10-CM

## 2022-04-18 DIAGNOSIS — J690 Pneumonitis due to inhalation of food and vomit: Secondary | ICD-10-CM

## 2022-04-18 LAB — COMPREHENSIVE METABOLIC PANEL
ALT: 15 U/L (ref 0–44)
AST: 21 U/L (ref 15–41)
Albumin: 2.5 g/dL — ABNORMAL LOW (ref 3.5–5.0)
Alkaline Phosphatase: 87 U/L (ref 38–126)
Anion gap: 7 (ref 5–15)
BUN: 29 mg/dL — ABNORMAL HIGH (ref 8–23)
CO2: 29 mmol/L (ref 22–32)
Calcium: 8.9 mg/dL (ref 8.9–10.3)
Chloride: 100 mmol/L (ref 98–111)
Creatinine, Ser: 1.5 mg/dL — ABNORMAL HIGH (ref 0.44–1.00)
GFR, Estimated: 37 mL/min — ABNORMAL LOW (ref >60)
Glucose, Bld: 142 mg/dL — ABNORMAL HIGH (ref 70–99)
Potassium: 4.9 mmol/L (ref 3.5–5.1)
Sodium: 136 mmol/L (ref 135–145)
Total Bilirubin: 0.5 mg/dL (ref 0.3–1.2)
Total Protein: 7.2 g/dL (ref 6.5–8.1)

## 2022-04-18 LAB — CBC WITH DIFFERENTIAL/PLATELET
Abs Immature Granulocytes: 0.02 10*3/uL (ref 0.00–0.07)
Basophils Absolute: 0 10*3/uL (ref 0.0–0.1)
Basophils Relative: 1 %
Eosinophils Absolute: 0 10*3/uL (ref 0.0–0.5)
Eosinophils Relative: 0 %
HCT: 23 % — ABNORMAL LOW (ref 36.0–46.0)
Hemoglobin: 7.2 g/dL — ABNORMAL LOW (ref 12.0–15.0)
Immature Granulocytes: 0 %
Lymphocytes Relative: 29 %
Lymphs Abs: 1.6 10*3/uL (ref 0.7–4.0)
MCH: 28.6 pg (ref 26.0–34.0)
MCHC: 31.3 g/dL (ref 30.0–36.0)
MCV: 91.3 fL (ref 80.0–100.0)
Monocytes Absolute: 0.9 10*3/uL (ref 0.1–1.0)
Monocytes Relative: 16 %
Neutro Abs: 2.9 10*3/uL (ref 1.7–7.7)
Neutrophils Relative %: 54 %
Platelets: 319 10*3/uL (ref 150–400)
RBC: 2.52 MIL/uL — ABNORMAL LOW (ref 3.87–5.11)
RDW: 14.7 % (ref 11.5–15.5)
WBC: 5.4 10*3/uL (ref 4.0–10.5)
nRBC: 0.4 % — ABNORMAL HIGH (ref 0.0–0.2)

## 2022-04-18 LAB — GLUCOSE, CAPILLARY
Glucose-Capillary: 114 mg/dL — ABNORMAL HIGH (ref 70–99)
Glucose-Capillary: 100 mg/dL — ABNORMAL HIGH (ref 70–99)
Glucose-Capillary: 313 mg/dL — ABNORMAL HIGH (ref 70–99)

## 2022-04-18 MED ORDER — ROSUVASTATIN CALCIUM 10 MG PO TABS: 10.0000 mg | ORAL_TABLET | Freq: Every day | ORAL | Status: AC

## 2022-04-18 MED ORDER — CHLORHEXIDINE GLUCONATE 0.12 % MT SOLN: 15.0000 mL | Freq: Two times a day (BID) | OROMUCOSAL | 0 refills | Status: AC

## 2022-04-18 MED ORDER — ASPIRIN 81 MG PO CHEW: 81.0000 mg | CHEWABLE_TABLET | Freq: Every day | ORAL | Status: AC

## 2022-04-18 MED ORDER — JEVITY 1.5 CAL/FIBER PO LIQD: 237.0000 mL | Freq: Every day | ORAL | Status: AC

## 2022-04-18 MED ORDER — JEVITY 1.5 CAL/FIBER PO LIQD: 474.0000 mL | Freq: Two times a day (BID) | ORAL | Status: AC

## 2022-04-18 MED ORDER — FAMOTIDINE 40 MG/5ML PO SUSR: 20.0000 mg | ORAL | Status: AC

## 2022-04-18 MED ORDER — BUDESONIDE 0.25 MG/2ML IN SUSP: 0.2500 mg | Freq: Two times a day (BID) | RESPIRATORY_TRACT | Status: AC

## 2022-04-18 MED ORDER — SODIUM CHLORIDE 3 % IN NEBU: 4.0000 mL | INHALATION_SOLUTION | Freq: Two times a day (BID) | RESPIRATORY_TRACT | Status: AC

## 2022-04-18 MED ORDER — INSULIN GLARGINE-YFGN 100 UNIT/ML ~~LOC~~ SOLN: 35.0000 [IU] | Freq: Every day | SUBCUTANEOUS | Status: AC

## 2022-04-18 MED ORDER — ALBUTEROL SULFATE (2.5 MG/3ML) 0.083% IN NEBU: 2.5000 mg | INHALATION_SOLUTION | RESPIRATORY_TRACT | Status: AC | PRN

## 2022-04-18 MED ORDER — POLYETHYLENE GLYCOL 3350 17 G PO PACK: 17.0000 g | PACK | Freq: Two times a day (BID) | ORAL | Status: AC

## 2022-04-18 MED ORDER — ARFORMOTEROL TARTRATE 15 MCG/2ML IN NEBU: 15.0000 ug | INHALATION_SOLUTION | Freq: Two times a day (BID) | RESPIRATORY_TRACT | Status: AC

## 2022-04-18 MED ORDER — PROSOURCE TF PO LIQD: 45.0000 mL | Freq: Two times a day (BID) | ORAL | Status: AC

## 2022-04-18 MED ORDER — LOSARTAN POTASSIUM 50 MG PO TABS: 50.0000 mg | ORAL_TABLET | Freq: Every day | ORAL | Status: AC

## 2022-04-18 MED ORDER — DIATRIZOATE MEGLUMINE & SODIUM 66-10 % PO SOLN
ORAL | Status: AC
  Filled 2022-04-18: qty 30

## 2022-04-18 MED ORDER — DOCUSATE SODIUM 50 MG/5ML PO LIQD: 100.0000 mg | Freq: Two times a day (BID) | ORAL | 0 refills | Status: AC

## 2022-04-18 MED ORDER — DIATRIZOATE MEGLUMINE & SODIUM 66-10 % PO SOLN
30.0000 mL | Freq: Once | ORAL | Status: AC
  Administered 2022-04-18: 30 mL

## 2022-04-18 MED ORDER — INSULIN ASPART 100 UNIT/ML IJ SOLN: 8.0000 [IU] | Freq: Two times a day (BID) | INTRAMUSCULAR | Status: AC

## 2022-04-18 MED ORDER — FREE WATER: 200.0000 mL | Freq: Four times a day (QID) | Status: AC

## 2022-04-18 MED ORDER — ACETAMINOPHEN 160 MG/5ML PO SOLN: 650.0000 mg | ORAL | Status: AC | PRN

## 2022-04-18 MED ORDER — OXYCODONE HCL 5 MG PO TABS
5.0000 mg | ORAL_TABLET | Freq: Four times a day (QID) | ORAL | Status: AC | PRN
Start: 2022-04-18 — End: ?

## 2022-04-18 MED ORDER — TICAGRELOR 90 MG PO TABS: 90.0000 mg | ORAL_TABLET | Freq: Two times a day (BID) | ORAL | Status: AC

## 2022-04-18 MED ORDER — INSULIN ASPART 100 UNIT/ML IJ SOLN: 0.0000 [IU] | Freq: Three times a day (TID) | INTRAMUSCULAR | Status: AC

## 2022-04-18 NOTE — Progress Notes (Addendum)
[facility] Progress Note Patient Name: [name] DOB: May 26, 1951 MRN: [id number]   Date of Service  04/18/2022  HPI/Events of Note  Notified that patient pulled out her trach. Seen on camera. Was being given o2 via mask and o2 sat was 100 and HR in 90s. RT replaced the trach tube back with no difficulty. Post procedure o2 sat also 99-100. Was previously on 40% fio2 via trach collar and will place back on it. Noted secretions after trach placed, reportedly not a new thing and has been an issue for her for a while now.   eICU Interventions  CXR stat Restraints ordered to prevent accidental dislodgement of devices again  Palliative discussions ongoing.      Intervention Category Major Interventions: Respiratory failure - evaluation and management  Sophya Vanblarcom G Mayan Kloepfer 04/18/2022, 3:11 AM  Addendum at 555 am: Retrospective entry- I had reviewed CXR. Lurline Idol looks fine.

## 2022-04-18 NOTE — Discharge Summary (Addendum)
Physician Discharge Summary   Patient: [name] MRN: [id number] DOB: 08-14-1951  Admit date:     03/23/2022  Discharge date: 04/18/22  Discharge Physician: Flora Lipps   PCP: Buzzy Han, MD (Inactive)   Recommendations at discharge:   Follow-up with primary care provider at the [facility] in 3 to 5 days.  Check CBC, BMP, LFT, magnesium in the next visit. Follow-up with [facility] as an outpatient.  Office to call for appointment.  Discharge Diagnoses: Principal Problem:   CVA (cerebral vascular accident) ([place]) Active Problems:   Hypothyroidism   DM2 (diabetes mellitus, type 2) ([place])   HLD (hyperlipidemia)   HTN (hypertension)   Stage 3b chronic kidney disease (CKD) (HCC)   GERD (gastroesophageal reflux disease)   Normocytic anemia   Dysphagia due to recent stroke   CAD (coronary artery disease)   Acute respiratory failure (HCC)  Resolved Problems:   Aspiration pneumonia ([place])   Epistaxis   Pulmonary edema   Sinus arrest  Hospital Course: [name] is a 71 y.o. female with medical history significant of CVA, diabetes mellitus type 2, hypertension, CKD stage IIIb, hyperlipidemia presented to the hospital with weakness and lightheadedness for 5 days with fogginess and poor balance.  In the ED, due to her vertigo and progressive dysphagia symptoms, she underwent imaging of the brain which showed left medullary stroke.  Patient had increasing dysphagia and was unable to protect her airway and patient needed frequent suctioning due to her hypoxia, PCCM was consulted.  Patient was intubated on 03/26/2022 for cardiac arrest likely secondary to aspiration versus hypoxia.  Unasyn was then initiated.  Patient was subsequently extubated on 04/01/22 and reintubated post extubation for stridor.  On 04/03/22, patient underwent tracheostomy placement and on 04/04/22 PEG tube was placed in.  On 04/05/2022, patient had some sinus bradycardia with pauses and  was started on dopamine and electrophysiology cardiology was consulted.  On 04/06/22 patient had temporary pacemaker inserted.  Subsequently, patient was considered stable for transfer out of the ICU.  After transfer from the ICU, patient continued to have increasing secretions over the tracheostomy tube.  Cardiology did not recommend a pacemaker placement since episodes of bradycardia were thought to be secondary to vagal stimulation.  On 04/16/2022 patient has expressed wishes not to be escalated on the treatment.  Palliative care was consulted to address goals of care and the plan is to proceed with the full scope of treatment..  Assessment and plan Principal Problem:   CVA (cerebral vascular accident) ([place]) Active Problems:   Hypothyroidism   DM2 (diabetes mellitus, type 2) ([place])   HLD (hyperlipidemia)   HTN (hypertension)   Stage 3b chronic kidney disease (CKD) (HCC)   GERD (gastroesophageal reflux disease)   Normocytic anemia   Dysphagia due to recent stroke   CAD (coronary artery disease)   Acute respiratory failure (HCC)   Aspiration pneumonia (HCC)   Pulmonary edema   Sinus arrest   Acute respiratory failure with hypoxia,  RLL aspiration pneumonia Completed course of antibiotic.  Currently on trach collar.  Pulmonary followed the patient during hospitalization for trach care.  Patient has required frequent suctioning.  Patient pulled out the tracheostomy yesterday so we will need to closely monitor.  Not on Robinul due to bradycardia.  We will continue bronchodilators and steroids inhalers on discharge.    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  On aspirin, Brilinta and statins.   Seen by neurology and pulmonary during hospitalization.  Continue trach care  Nutrition.  On bolus tube feeding.  Tolerating so far.   We will continue on discharge.  Intermittent Symptomatic bradycardia / cardiac pause S/p systolic cardiac arrest x2 due to severe bradycardia Electrophysiology cardiology following and the impression is vagal pauses.  Patient had bradycardia during suctioning.  No intervention planned as per cardiology.  Essential HTN Continue amlodipine on discharge   Hyperlipidemia Continue crestor, gemfibrozil on discharge.   Acute blood loss anemia on anemia of chronic disease from tracheal site bleeding  Latest hemoglobin of 7.2.  Patient was initially on Brilinta during hospitalization which was kept on hold due to rectal site bleeding.  We will continue aspirin and Brilinta on discharge.  Would recommend monitoring CBC as outpatient.   Hypothyroidism  Continue synthroid    Poorly controlled diabetes mellitus type 2 with hyperglycemia Latest Hemoglobin A1c is 9.5, continue sliding scale insulin and basal insulin on discharge.  Will need frequent monitoring.  Debility, deconditioning.  Patient has been seen by physical therapy and the plan is LTAC placement.  Goals of care.    Palliative care was involved and plan is full scope of treatment at this time.  Consultants:  PCCM Electrophysiology cardiology Cardiology Neurology Palliative care Interventional radiology  Procedures performed:  Status post tracheostomy placement 04/03/22 Status post PEG tube placement 04/04/22 Temporary pacemaker placement    Disposition:  LTAC Diet recommendation:  NPO with tube feeds DISCHARGE MEDICATION: Allergies as of 04/18/2022       Reactions   Dilantin [phenytoin Sodium Extended]    Rash/itching   Precedex [dexmedetomidine Hcl In Nacl] Other (See Comments)   Bradycardic cardiac arrest   Insulins Rash   Humalog 75/25 only   Statins Nausea Only, Rash   "sick to my stomach"; describes "rash and a lump" while taking statins        Medication List     STOP taking these medications     cilostazol 100 MG tablet Commonly known as: PLETAL   clopidogrel 75 MG tablet Commonly known as: PLAVIX   Lantus SoloStar 100 UNIT/ML Solostar Pen Generic drug: insulin glargine   losartan-hydrochlorothiazide 100-25 MG tablet Commonly known as: HYZAAR       TAKE these medications    Accu-Chek Aviva Plus test strip Generic drug: glucose blood 1 each by Other route 2 (two) times daily. Use as instructed   accu-chek soft touch lancets Use as instructed   acetaminophen 160 MG/5ML solution Commonly known as: TYLENOL Place 20.3 mLs (650 mg total) into feeding tube every 4 (four) hours as needed for mild pain (or temp > 37.5 C (99.5 F)).   albuterol (2.5 MG/3ML) 0.083% nebulizer solution Commonly known as: PROVENTIL Take 3 mLs (2.5 mg total) by nebulization every 4 (four) hours as needed for wheezing or shortness of breath.   amLODipine 10 MG tablet Commonly known as: NORVASC Take 10 mg by mouth daily.   arformoterol 15 MCG/2ML Nebu Commonly known as: BROVANA Take 2 mLs (15 mcg total) by nebulization 2 (two) times daily.   aspirin 81 MG chewable tablet Place 1 tablet (81 mg total) into feeding tube daily.   B-D UF III MINI PEN NEEDLES 31G X 5 MM Misc Generic drug: Insulin Pen Needle INJECTS INSULIN 2 TIMES PER DAY.   BD Insulin Syringe Ultrafine 31G X 15/64" 0.5 ML Misc Generic drug: Insulin Syringe-Needle U-100 USE TO INJECT  INSULIN TWO TIMES A DAY   budesonide 0.25 MG/2ML nebulizer solution Commonly known as: PULMICORT Take 2 mLs (0.25 mg total) by nebulization 2 (two) times daily.   chlorhexidine 0.12 % solution Commonly known as: PERIDEX 15 mLs by Mouth Rinse route 2 (two) times daily.   docusate 50 MG/5ML liquid Commonly known as: COLACE Place 10 mLs (100 mg total) into feeding tube 2 (two) times daily.   famotidine 40 MG/5ML suspension Commonly known as: PEPCID Place 2.5 mLs (20 mg total) into feeding tube daily.   feeding supplement (PROSource  TF) liquid Place 45 mLs into feeding tube 2 (two) times daily.   feeding supplement (JEVITY 1.5 CAL/FIBER) Liqd Place 237 mLs into feeding tube daily.   feeding supplement (JEVITY 1.5 CAL/FIBER) Liqd Place 474 mLs into feeding tube 2 (two) times daily.   free water Soln Place 200 mLs into feeding tube every 6 (six) hours.   gemfibrozil 600 MG tablet Commonly known as: LOPID TAKE 1 TABLET BY MOUTH TWICE A DAY What changed: when to take this   insulin aspart 100 UNIT/ML injection Commonly known as: novoLOG Inject 8 Units into the skin 2 (two) times daily.   insulin aspart 100 UNIT/ML injection Commonly known as: novoLOG Inject 0-9 Units into the skin 3 (three) times daily with meals.   insulin glargine-yfgn 100 UNIT/ML injection Commonly known as: SEMGLEE Inject 0.35 mLs (35 Units total) into the skin daily.   levothyroxine 88 MCG tablet Commonly known as: SYNTHROID TAKE 1 TABLET BY MOUTH EVERY DAY BEFORE BREAKFAST What changed: See the new instructions.   losartan 50 MG tablet Commonly known as: COZAAR Place 1 tablet (50 mg total) into feeding tube daily.   oxyCODONE 5 MG immediate release tablet Commonly known as: Oxy IR/ROXICODONE Place 1 tablet (5 mg total) into feeding tube every 6 (six) hours as needed for moderate pain, severe pain or breakthrough pain.   polyethylene glycol 17 g packet Commonly known as: MIRALAX / GLYCOLAX Place 17 g into feeding tube 2 (two) times daily.   rosuvastatin 10 MG tablet Commonly known as: CRESTOR Place 1 tablet (10 mg total) into feeding tube daily.   sodium chloride HYPERTONIC 3 % nebulizer solution Take 4 mLs by nebulization 2 (two) times daily.   ticagrelor 90 MG Tabs tablet Commonly known as: Brilinta Take 1 tablet (90 mg total) by mouth 2 (two) times daily.        Follow-up Information     Buzzy Han, MD .   Specialty: Family Medicine Contact information: [place]  [id number] [phone]         Frann Rider, NP. Schedule an appointment as soon as possible for a visit in 1 month(s).   Specialty: Neurology Why: stroke clinic Contact information: [address] [place] [address] [place] [address] [phone]                Subjective: Today, patient was seen and examined at bedside.  No interval complaints reported.  Has some secretions.   Discharge Exam: Filed Weights   04/14/22 0500 04/15/22 0500 04/18/22 0401  Weight: 71.5 kg 72.2 kg 69.9 kg      04/18/2022    9:00 AM 04/18/2022    8:00 AM 04/18/2022    7:37 AM  Vitals with BMI  Systolic  096   Diastolic  69   Pulse 91 84 91   General:  Average built, not in obvious distress, appears ill and deconditioned and weak. HENT:  No scleral pallor or icterus noted. Oral mucosa is moist.  Tracheostomy in place. Chest:    Diminished breath sounds bilaterally, coarse breath sounds noted. CVS: S1 &S2 heard. No murmur.  Regular rate and rhythm. Abdomen: Soft, nontender, nondistended.  Bowel sounds are heard.  PEG tube in place. Extremities: No cyanosis, clubbing or edema.  Peripheral pulses are palpable. Psych: Mildly sleepy but following commands, difficult to understand speech.   CNS: Moves extremities, follow commands.  Weakness of extremities. Skin: Warm and dry.  No rashes noted.   Condition at discharge: stable  The results of significant diagnostics from this hospitalization (including imaging, microbiology, ancillary and laboratory) are listed below for reference.   Imaging Studies: CT ABDOMEN WO CONTRAST  Result Date: 04/03/2022 CLINICAL DATA:  Evaluate anatomy for potential percutaneous gastrostomy tube placement. EXAM: CT ABDOMEN WITHOUT CONTRAST TECHNIQUE: Multidetector CT imaging of the abdomen was performed following the standard protocol without IV contrast. RADIATION DOSE REDUCTION: This exam was performed according to the departmental dose-optimization program which includes  automated exposure control, adjustment of the mA and/or kV according to patient size and/or use of iterative reconstruction technique. COMPARISON:  None Available. FINDINGS: The lack of intravenous contrast limits the ability to evaluate solid abdominal organs. Lower chest: Limited visualization of the lower thorax demonstrates bibasilar consolidative opacities and air bronchograms, left slightly greater than right. Borderline cardiomegaly. Coronary artery calcifications. Calcification involving the aortic root. Trace amount of pericardial fluid, presumably physiologic. Hepatobiliary: Normal hepatic contour. Suspected punctate (approximately 0.3 cm) stone lying neck of the gallbladder (image 26, series 3). Suspected calcifications involving the gallbladder wall are also seen on image 26, series 3 no definitive gallbladder wall thickening or pericholecystic stranding. No ascites. Pancreas: Normal noncontrast appearance of the pancreas. Spleen: Normal noncontrast appearance of the spleen. Note is made of a punctate splenule. Adrenals/Urinary Tract: The left kidney is slightly atrophic in relation to the right, similar to the 2016 examination. Calcifications about the bilateral renal hila are favored to be vascular in etiology. No definite evidence of nephrolithiasis. No renal stones are seen along expected course of either ureter. No urinary obstruction or perinephric stranding. Normal noncontrast appearance the bilateral adrenal glands. The urinary bladder was not imaged. Stomach/Bowel: The anterior wall the stomach is well apposed against the ventral abdominal wall without interposition of either the liver or transverse colon. Enteric contrast is seen throughout the colon. No evidence of enteric obstruction. Normal noncontrast appearance of the terminal ileum and retrocecal appendix. No hiatal hernia. Enteric tube tip terminates within the gastric antrum. Vascular/Lymphatic: Atherosclerotic plaque within normal  caliber abdominal aorta. Potential hemodynamically significant narrowings involving the bilateral common iliac arteries, left greater than right (image 58 and 59, series 3). Other: Tiny mesenteric fat containing periumbilical hernia. Mild diffuse body wall anasarca. Musculoskeletal: No acute or aggressive osseous abnormalities. Bilateral facet degenerative change the lower lumbar spine. Stigmata of dish within the thoracic spine. IMPRESSION: 1. Gastric anatomy amenable to potential percutaneous gastrostomy tube placement as indicated. 2. Bibasilar consolidative opacities with associated bronchograms, left greater right, atelectasis versus infiltrate. 3. Cholelithiasis with suspected calcifications involving the gallbladder wall without evidence of acute cholecystitis on this noncontrast examination. Further evaluation with right upper quadrant abdominal ultrasound could be performed as indicated. 4. Coronary artery calcifications. Aortic Atherosclerosis (ICD10-I70.0). 5. Potential hemodynamically significant narrowings involving the bilateral common iliac arteries, left greater than right. Correlation for symptoms of PAD is advised. Further evaluation with acquisition of nonemergent ABIs could be performed as indicated. Electronically Signed  By: Sandi Mariscal M.D.   On: 04/03/2022 09:49   DG Chest 1 View  Result Date: 03/25/2022 CLINICAL DATA:  Near syncope with shortness of breath. EXAM: CHEST  1 VIEW COMPARISON:  Mar 25, 2022 FINDINGS: The heart size and mediastinal contours are within normal limits. A radiopaque loop recorder device is noted. Low lung volumes are seen with mild bibasilar atelectasis. There is no evidence of a pleural effusion or pneumothorax. The visualized skeletal structures are unremarkable. IMPRESSION: Low lung volumes without acute cardiopulmonary disease. Electronically Signed   By: Virgina Norfolk M.D.   On: 03/25/2022 17:28   DG Chest 1 View  Result Date: 03/25/2022 CLINICAL  DATA:  Near syncope.  Nausea. EXAM: CHEST  1 VIEW COMPARISON:  Chest radiographs 02/06/2017 FINDINGS: A loop recorder has been placed. The cardiomediastinal silhouette is within normal limits. The lungs are hypoinflated. No focal airspace consolidation, overt pulmonary edema, sizable pleural effusion, or pneumothorax is identified. Small density in the left mid upper abdomen may represent residual oral contrast material. IMPRESSION: Hypoinflation without evidence of acute airspace disease. Electronically Signed   By: Logan Bores M.D.   On: 03/25/2022 11:12   MR ANGIO HEAD WO CONTRAST  Result Date: 03/25/2022 CLINICAL DATA:  Stroke follow-up EXAM: MRI HEAD WITHOUT CONTRAST MRA HEAD WITHOUT CONTRAST MRA NECK WITHOUT CONTRAST TECHNIQUE: Multiplanar, multiecho pulse sequences of the brain and surrounding structures were obtained without intravenous contrast. Angiographic images of the Circle of Willis were obtained using MRA technique without intravenous contrast. Angiographic images of the neck were obtained using MRA technique without intravenous contrast. Carotid stenosis measurements (when applicable) are obtained utilizing NASCET criteria, using the distal internal carotid diameter as the denominator. COMPARISON:  Brain MRI from 2 days ago FINDINGS: MRI HEAD FINDINGS Only diffusion and gradient imaging was acquired per request. 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 No new area of infarction and no acute hemorrhage. No hydrocephalus or shift. MRA HEAD FINDINGS Atheromatous irregularity of the carotid siphons and bilateral MCA vessels. Multifocal moderate narrowings are seen at the bilateral M1 and M2 levels. In the posterior circulation there is high-grade narrowing of the non dominant left distal V4 segment. Negative for aneurysm or generalized beading. MRA NECK FINDINGS Normal diameter arch with 3 vessel branching. Mild atheromatous plaque at the bilateral carotid bulb. No carotid  stenosis or ulceration. No proximal subclavian stenosis. The vertebral arteries are smoothly contoured and diffusely patent to the level of the dura. IMPRESSION: Brain MRI: Limited acquisition shows no progression of acute left medulla infarcts. Intracranial MRA: High-grade narrowing of the non dominant left V4 segment. Unremarkable left cerebellar branches. Moderate atheromatous narrowing of bilateral MCA. Neck MRA: Cervical carotid bifurcation atherosclerosis. No flow limiting stenosis of major arteries in the neck. Electronically Signed   By: Jorje Guild M.D.   On: 03/25/2022 11:06   MR ANGIO NECK WO CONTRAST  Result Date: 03/25/2022 CLINICAL DATA:  Stroke follow-up EXAM: MRI HEAD WITHOUT CONTRAST MRA HEAD WITHOUT CONTRAST MRA NECK WITHOUT CONTRAST TECHNIQUE: Multiplanar, multiecho pulse sequences of the brain and surrounding structures were obtained without intravenous contrast. Angiographic images of the Circle of Willis were obtained using MRA technique without intravenous contrast. Angiographic images of the neck were obtained using MRA technique without intravenous contrast. Carotid stenosis measurements (when applicable) are obtained utilizing NASCET criteria, using the distal internal carotid diameter as the denominator. COMPARISON:  Brain MRI from 2 days ago FINDINGS: MRI HEAD FINDINGS Only diffusion and gradient imaging was  acquired per request. 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 No new area of infarction and no acute hemorrhage. No hydrocephalus or shift. MRA HEAD FINDINGS Atheromatous irregularity of the carotid siphons and bilateral MCA vessels. Multifocal moderate narrowings are seen at the bilateral M1 and M2 levels. In the posterior circulation there is high-grade narrowing of the non dominant left distal V4 segment. Negative for aneurysm or generalized beading. MRA NECK FINDINGS Normal diameter arch with 3 vessel branching. Mild atheromatous plaque at the  bilateral carotid bulb. No carotid stenosis or ulceration. No proximal subclavian stenosis. The vertebral arteries are smoothly contoured and diffusely patent to the level of the dura. IMPRESSION: Brain MRI: Limited acquisition shows no progression of acute left medulla infarcts. Intracranial MRA: High-grade narrowing of the non dominant left V4 segment. Unremarkable left cerebellar branches. Moderate atheromatous narrowing of bilateral MCA. Neck MRA: Cervical carotid bifurcation atherosclerosis. No flow limiting stenosis of major arteries in the neck. Electronically Signed   By: Jorje Guild M.D.   On: 03/25/2022 11:06   MR BRAIN WO CONTRAST  Result Date: 03/25/2022 CLINICAL DATA:  Stroke follow-up EXAM: MRI HEAD WITHOUT CONTRAST MRA HEAD WITHOUT CONTRAST MRA NECK WITHOUT CONTRAST TECHNIQUE: Multiplanar, multiecho pulse sequences of the brain and surrounding structures were obtained without intravenous contrast. Angiographic images of the Circle of Willis were obtained using MRA technique without intravenous contrast. Angiographic images of the neck were obtained using MRA technique without intravenous contrast. Carotid stenosis measurements (when applicable) are obtained utilizing NASCET criteria, using the distal internal carotid diameter as the denominator. COMPARISON:  Brain MRI from 2 days ago FINDINGS: MRI HEAD FINDINGS Only diffusion and gradient imaging was acquired per request. 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 No new area of infarction and no acute hemorrhage. No hydrocephalus or shift. MRA HEAD FINDINGS Atheromatous irregularity of the carotid siphons and bilateral MCA vessels. Multifocal moderate narrowings are seen at the bilateral M1 and M2 levels. In the posterior circulation there is high-grade narrowing of the non dominant left distal V4 segment. Negative for aneurysm or generalized beading. MRA NECK FINDINGS Normal diameter arch with 3 vessel branching. Mild  atheromatous plaque at the bilateral carotid bulb. No carotid stenosis or ulceration. No proximal subclavian stenosis. The vertebral arteries are smoothly contoured and diffusely patent to the level of the dura. IMPRESSION: Brain MRI: Limited acquisition shows no progression of acute left medulla infarcts. Intracranial MRA: High-grade narrowing of the non dominant left V4 segment. Unremarkable left cerebellar branches. Moderate atheromatous narrowing of bilateral MCA. Neck MRA: Cervical carotid bifurcation atherosclerosis. No flow limiting stenosis of major arteries in the neck. Electronically Signed   By: Jorje Guild M.D.   On: 03/25/2022 11:06   MR BRAIN WO CONTRAST  Result Date: 03/23/2022 CLINICAL DATA:  Woke up 4 days ago feeling like she was going to pass out, persisted. Nausea. Evaluate for stroke EXAM: MRI HEAD WITHOUT CONTRAST TECHNIQUE: Multiplanar, multiecho pulse sequences of the brain and surrounding structures were obtained without intravenous contrast. COMPARISON:  MR head 02/16/2018 FINDINGS: Brain: There are small foci of patchy diffusion restriction in the left aspect of the medulla consistent with acute to early subacute infarct (5-58). There is no associated hemorrhage or mass effect. There is no other acute infarct. There is no acute intracranial hemorrhage or extra-axial fluid collection. Background parenchymal volume is normal. The ventricles are normal in size. Patchy and confluent FLAIR signal abnormality throughout the subcortical and periventricular white matter likely  reflects sequela of moderate chronic white matter microangiopathy, progressed since 2019. There are small remote lacunar infarcts in the bilateral basal ganglia. There is no solid mass lesion. There is no mass effect or midline shift. Vascular: The major intracranial flow voids are present. Specifically, the bilateral V4 segments and basilar artery flow voids are normal. Skull and upper cervical spine: Normal marrow  signal. Sinuses/Orbits: The paranasal sinuses are clear. The globes and orbits are unremarkable. Other: None. IMPRESSION: 1. Small acute to early subacute infarct in the left aspect of the medulla. No associated hemorrhage or mass effect. 2. Background moderate chronic white matter microangiopathy, progressed since 2019, and small remote lacunar infarcts in the bilateral basal ganglia and right caudate head. Electronically Signed   By: Valetta Mole M.D.   On: 03/23/2022 15:39   IR GASTROSTOMY TUBE MOD SED  Result Date: 04/04/2022 INDICATION: Dysphagia, CVA EXAM: FLUOROSCOPIC 24 FRENCH PULL-THROUGH GASTROSTOMY Date:  04/04/2022 04/04/2022 11:38 am Radiologist:  M. Daryll Brod, MD Guidance:  Fluoroscopic MEDICATIONS: 2 g Ancef; Antibiotics were administered within 1 hour of the procedure. Glucagon 0.5 mg IV ANESTHESIA/SEDATION: Versed 1 mg IV; Fentanyl 50 mcg IV Moderate Sedation Time:  17 minute The patient was continuously monitored during the procedure by the interventional radiology nurse under my direct supervision. CONTRAST:  62m OMNIPAQUE IOHEXOL 300 MG/ML SOLN - administered into the gastric lumen. FLUOROSCOPY: Fluoroscopy Time: 2 minutes 54 seconds (99 mGy). COMPLICATIONS: None immediate. PROCEDURE: Informed consent was obtained from the patient following explanation of the procedure, risks, benefits and alternatives. The patient understands, agrees and consents for the procedure. All questions were addressed. A time out was performed. Maximal barrier sterile technique utilized including caps, mask, sterile gowns, sterile gloves, large sterile drape, hand hygiene, and betadine prep. The left upper quadrant was sterilely prepped and draped. An oral gastric catheter was inserted into the stomach under fluoroscopy. The existing nasogastric feeding tube was removed. Air was injected into the stomach for insufflation and visualization under fluoroscopy. The air distended stomach was confirmed beneath the  anterior abdominal wall in the frontal and lateral projections. Under sterile conditions and local anesthesia, a 154gauge trocar needle was utilized to access the stomach percutaneously beneath the left subcostal margin. Needle position was confirmed within the stomach under biplane fluoroscopy. Contrast injection confirmed position also. A single T tack was deployed for gastropexy. Over an Amplatz guide wire, a 9-French sheath was inserted into the stomach. A snare device was utilized to capture the oral gastric catheter. The snare device was pulled retrograde from the stomach up the esophagus and out the oropharynx. The 20-French pull-through gastrostomy was connected to the snare device and pulled antegrade through the oropharynx down the esophagus into the stomach and then through the percutaneous tract external to the patient. The gastrostomy was assembled externally. Contrast injection confirms position in the stomach. Images were obtained for documentation. The patient tolerated procedure well. No immediate complication. IMPRESSION: Fluoroscopic insertion of a 20-French "pull-through" gastrostomy. Electronically Signed   By: MJerilynn Mages  Shick M.D.   On: 04/04/2022 13:07   CARDIAC CATHETERIZATION  Result Date: 04/06/2022 Successful insertion of a 5 French temporary pacemaker via the right femoral vein and advanced to the right ventricular apex.  Pacemaker currently set at a rate of 60 and MA 8. Patient is followed by EP and will require plans for permanent pacemaker.   DG Chest Port 1 View  Result Date: 04/18/2022 CLINICAL DATA:  Hypoxia. EXAM: PORTABLE CHEST 1 VIEW COMPARISON:  04/17/2022.  FINDINGS: The heart size and mediastinal contours are within normal limits. There is atherosclerotic calcification of the aorta. Lung volumes are low. Mild patchy airspace disease is noted at the left lung base. No effusion or pneumothorax. A tracheostomy tube terminates 4.7 cm above the carina. No acute osseous abnormality.  A loop recorder device is present over the left chest. IMPRESSION: Lung volumes with atelectasis or infiltrate at the left lung base. Electronically Signed   By: Brett Fairy M.D.   On: 04/18/2022 04:18   DG CHEST PORT 1 VIEW  Result Date: 04/17/2022 CLINICAL DATA:  [id number]; hypoxemia EXAM: PORTABLE CHEST 1 VIEW COMPARISON:  Chest radiograph dated Apr 11, 2022 FINDINGS: The cardiomediastinal silhouette is unchanged in contour.Tracheostomy. Atherosclerotic calcifications. Cardiac loop recorder. No pleural effusion. No pneumothorax. No acute pleuroparenchymal abnormality. Visualized abdomen is unremarkable. IMPRESSION: No acute cardiopulmonary abnormality. Electronically Signed   By: Valentino Saxon M.D.   On: 04/17/2022 08:54   DG Chest Port 1 View  Result Date: 04/11/2022 CLINICAL DATA:  Encounter for tracheostomy care. EXAM: PORTABLE CHEST 1 VIEW COMPARISON:  Portable chest yesterday at 5:52 a.m. FINDINGS: 5:13 a.m., 04/11/2022. Tracheostomy cannula tip is 3.7 cm from the carina. Translucent electrical pad superimposes over the left lower chest. There is a loop recorder device in the left chest wall. The cardiac size is normal. There is calcification in the aortic arch. No vascular congestion is seen. There are perihilar atelectatic bands again noted. No focal pneumonia is seen. Inspiration is somewhat low.  No pleural effusion is evident. IMPRESSION: No evidence of acute chest disease.  Low lung volumes. Electronically Signed   By: Telford Nab M.D.   On: 04/11/2022 07:35   DG CHEST PORT 1 VIEW  Result Date: 04/10/2022 CLINICAL DATA:  71 year old female with history of respiratory failure. EXAM: PORTABLE CHEST 1 VIEW COMPARISON:  Chest x-ray 04/09/2022. FINDINGS: A tracheostomy tube is in place with tip 5.4 cm above the carina. Transcutaneous defibrillator pad projecting over the lower left hemithorax. Implantable loop recorder projecting over the lower left hemithorax. Lung volumes are low. Minimal  subsegmental atelectasis in the right mid to lower lung, improved compared to the prior study. No consolidative airspace disease. No pleural effusions. No definite pneumothorax. No pulmonary nodule or mass noted. Pulmonary vasculature and the cardiomediastinal silhouette are within normal limits. IMPRESSION: 1. Support apparatus, as above. 2. Low lung volumes without radiographic evidence of acute cardiopulmonary disease. Previously noted small right-sided pneumothorax is not readily apparent on today's examination. Electronically Signed   By: Vinnie Langton M.D.   On: 04/10/2022 06:42   DG CHEST PORT 1 VIEW  Result Date: 04/09/2022 CLINICAL DATA:  Hypoxia. EXAM: PORTABLE CHEST 1 VIEW COMPARISON:  Chest x-ray 03/31/2022. FINDINGS: Endotracheal tube has been removed. There is a new tracheostomy with distal tip 3.1 cm above the carina. Enteric tube has been removed. The cardiomediastinal silhouette is within normal limits. There are new patchy and linear opacities in the right lung base. Costophrenic angles are clear. There is also a new right-sided pneumothorax measuring 2.5 cm from the lung apex. Left lung is clear. No acute fractures are seen. IMPRESSION: 1. New small right pneumothorax. 2. New right basilar atelectasis. Electronically Signed   By: Ronney Asters M.D.   On: 04/09/2022 21:53   DG Chest Port 1 View  Result Date: 04/03/2022 CLINICAL DATA:  Status post tracheostomy. EXAM: PORTABLE CHEST 1 VIEW COMPARISON:  Apr 01, 2022. FINDINGS: Tracheostomy tube is in grossly good position. Stable cardiomediastinal silhouette.  Mild bibasilar subsegmental atelectasis is noted. IMPRESSION: Tracheostomy tube in grossly good position. Electronically Signed   By: Marijo Conception M.D.   On: 04/03/2022 15:21   DG CHEST PORT 1 VIEW  Result Date: 04/01/2022 CLINICAL DATA:  Acute respiratory failure. Endotracheally intubated. Nasogastric tube placement. EXAM: PORTABLE CHEST 1 VIEW COMPARISON:  Prior today  FINDINGS: Endotracheal tube remains in appropriate position. Nasogastric tube is seen with tip overlying the body of the stomach. The heart size and mediastinal contours are within normal limits. Aortic atherosclerotic calcification incidentally noted. Increased atelectasis or infiltrate is seen in the left lower lobe with silhouetting of the left hemidiaphragm. IMPRESSION: Endotracheal tube and nasogastric tube in appropriate position. Increased left lower lobe atelectasis versus infiltrate. Electronically Signed   By: Marlaine Hind M.D.   On: 04/01/2022 12:37   DG Chest Port 1 View  Result Date: 04/01/2022 CLINICAL DATA:  Pulmonary edema. EXAM: PORTABLE CHEST 1 VIEW COMPARISON:  Mar 31, 2022 FINDINGS: The ETT remains in good position, terminating above the carina. The NG tube terminates below today's film. No pneumothorax identified. No pulmonary edema. Left retrocardiac opacity not excluded based on this study. No other abnormalities. IMPRESSION: 1. Support apparatus as above. 2. No pulmonary edema. 3. Left retrocardiac opacity not excluded on this single view portable film. If there is clinical concern, recommend a PA and lateral chest x-ray. Otherwise, recommend attention on follow-up. Electronically Signed   By: Dorise Bullion III M.D.   On: 04/01/2022 08:38   DG Chest Port 1 View  Result Date: 03/31/2022 CLINICAL DATA:  Advancement of endotracheal tube. EXAM: PORTABLE CHEST 1 VIEW COMPARISON:  Earlier today at 7:25 a.m. FINDINGS: 849 hours. Endotracheal tube has been advanced and terminates 3.1 cm above carina. Nasogastric tube extends beyond the inferior aspect of the film. Loop recorder or wireless pacer projecting over the left side of the chest. Normal heart size for level of inspiration. No pleural effusion or pneumothorax. Lung volumes are low. Persistent left greater than right base airspace disease. IMPRESSION: Appropriate position of endotracheal tube. Otherwise, no significant change in low  lung volumes with bibasilar airspace disease, favoring atelectasis. Electronically Signed   By: Abigail Miyamoto M.D.   On: 03/31/2022 09:15   DG Chest Port 1 View  Addendum Date: 03/31/2022   ADDENDUM REPORT: 03/31/2022 07:25 ADDENDUM: Study discussed by telephone with Dr. Oletta Darter on 03/31/2022 at 0656 hours. Electronically Signed   By: Genevie Ann M.D.   On: 03/31/2022 07:25   Result Date: 03/31/2022 CLINICAL DATA:  71 year old female with respiratory failure. EXAM: PORTABLE CHEST 1 VIEW COMPARISON:  Portable chest 03/30/2022 and earlier. FINDINGS: Portable AP semi upright view at 0531 hours. Endotracheal tube tip is now above the level of the clavicles, approximately 7 cm from the carina. Enteric tube terminates in the stomach, side hole at the level of the gastric fundus. Mildly improved lung volumes and improved left lung base ventilation with regressed opacity and air bronchograms. Streaky residual peribronchial opacity in both lower lungs. Normal cardiac size and mediastinal contours. No pneumothorax or pulmonary edema. No definite pleural effusion. Left chest cardiac event recorder or lead less ICD. Barium type contrast at the splenic flexure. Stable visualized osseous structures. IMPRESSION: 1. ETT tip now above the clavicles. Recommend advancing 3 cm with repeat portable chest x-ray to confirm positioning. 2. Regressed left lung base collapse or consolidation since yesterday. Improving bibasilar ventilation. Electronically Signed: By: Genevie Ann M.D. On: 03/31/2022 06:43   DG Chest [facility]  1 View  Result Date: 03/30/2022 CLINICAL DATA:  A 71 year old female presents for evaluation of pneumonia, patient is intubated. EXAM: PORTABLE CHEST 1 VIEW COMPARISON:  Mar 29, 2022 FINDINGS: Endotracheal tube terminates between the clavicular heads approximately 4.8 cm from the carina. Tip between clavicular heads. Gastric tube with tip in the proximal to mid stomach, redundant loop in the fundus of the stomach. EKG leads  project over the patient's chest. Cardiomediastinal contours and hilar structures are stable. Cardiac loop recorder over the LEFT chest as before Persistent dense consolidation at the LEFT lung base obscuring LEFT hemidiaphragm. No visible pneumothorax. On limited assessment there is no acute skeletal finding. IMPRESSION: 1. Persistent LEFT basilar airspace disease with obscured LEFT hemidiaphragm, volume loss or pneumonia are considered, not changed from previous imaging. 2. Stable appearance of support apparatus. Electronically Signed   By: Zetta Bills M.D.   On: 03/30/2022 08:07   DG CHEST PORT 1 VIEW  Result Date: 03/29/2022 CLINICAL DATA:  [id number]; intubation EXAM: PORTABLE CHEST 1 VIEW COMPARISON:  Radiograph dated Mar 26, 2022 FINDINGS: Evaluation is limited by patient rotation. The cardiomediastinal silhouette is unchanged in contour.ETT tip terminates 5.5 cm above the carina. Enteric tube tip and side port project over the stomach. Cardiac loop recorder. Likely small LEFT pleural effusion. No pneumothorax. Persistent LEFT retrocardiac opacity and RIGHT basilar opacity, similar in comparison to prior. Enteric contrast within the colon. Atherosclerotic calcifications. IMPRESSION: 1.  Support apparatus as described above. 2. Persistent bibasilar opacities with differential considerations including infection, aspiration or atelectasis. Likely small LEFT pleural effusion. Electronically Signed   By: Valentino Saxon M.D.   On: 03/29/2022 09:51   DG CHEST PORT 1 VIEW  Addendum Date: 03/26/2022   ADDENDUM REPORT: 03/26/2022 12:52 ADDENDUM: PRA has attempted to page covering Dr. for 30 minutes. As such, these results will be called to the ordering clinician or representative by the Radiologist Assistant, and communication documented in the PACS or [employer]. Electronically Signed   By: Valentino Saxon M.D.   On: 03/26/2022 12:52   Result Date: 03/26/2022 CLINICAL DATA:  [id number];  intubation EXAM: PORTABLE CHEST 1 VIEW COMPARISON:  Chest radiograph dated Mar 25, 2022 FINDINGS: Evaluation is limited secondary to patient rotation. The cardiomediastinal silhouette is unchanged in contour.ETT tube tip projects over the RIGHT mainstem bronchus. Enteric tube tip and side port project over the stomach. Cardiac loop recorder. Small LEFT pleural effusion. No pneumothorax. LEFT retrocardiac opacity, nonspecific with differential considerations including atelectasis versus infection/aspiration. High density material delineating loops of colon consistent with prior enteric contrast administration. IMPRESSION: ETT tip projects over the region of the RIGHT mainstem bronchus. Recommend repositioning. PRA system was activated 03/26/2022 at 12:22 p.m. at time of interpretation. Addendum will be issued upon telephonic communication. Electronically Signed: By: Valentino Saxon M.D. On: 03/26/2022 12:26   DG Abd Portable 1V  Result Date: 03/30/2022 CLINICAL DATA:  OG tube placement EXAM: PORTABLE ABDOMEN - 1 VIEW COMPARISON:  None Available. FINDINGS: Orogastric tube with the tip projecting over the stomach. Contrast in the colon. No bowel dilatation to suggest obstruction. No evidence of pneumoperitoneum, portal venous gas or pneumatosis. No pathologic calcifications along the expected course of the ureters. No acute osseous abnormality. IMPRESSION: 1. Orogastric tube with the tip projecting over the stomach. Electronically Signed   By: Kathreen Devoid M.D.   On: 03/30/2022 09:54   DG Abd Portable 1V  Result Date: 03/24/2022 CLINICAL DATA:  Nasogastric tube placement. EXAM: PORTABLE ABDOMEN - 1  VIEW COMPARISON:  June 24, 2007. FINDINGS: Feeding tube is seen entering stomach. However, due to the presence of contrast throughout the stomach, the distal tip is not visualized. IMPRESSION: Feeding tube is seen entering stomach, although distal tip is not visualized due to contrast within the stomach.  Electronically Signed   By: Marijo Conception M.D.   On: 03/24/2022 16:01   DG Swallowing Func-Speech Pathology  Result Date: 03/24/2022 Table formatting from the original result was not included. Objective Swallowing Evaluation: Type of Study: MBS-Modified Barium Swallow Study  Patient Details Name: Kailynne Ferrington MRN: [id number] Date of Birth: [birth year] Today's Date: 03/24/2022 Time: SLP Start Time (ACUTE ONLY): 0240 -SLP Stop Time (ACUTE ONLY): 9735 SLP Time Calculation (min) (ACUTE ONLY): 16 min Past Medical History: Past Medical History: Diagnosis Date  Anemia   Arteriosclerotic cardiovascular disease   CKD (chronic kidney disease) stage 3, GFR 30-59 ml/min (HCC)   Claudication in peripheral vascular disease (HCC)   CVA (cerebral vascular accident) ([place])   2012 and 2019-affected left side + dysarthria 2/2 severe R ACA and MCA stenosis  GERD (gastroesophageal reflux disease)   Hyperlipidemia   declines statins  Hypertension   Hypothyroidism   Type 2 diabetes mellitus ([place]) 2000  Vitamin D deficiency  Past Surgical History: Past Surgical History: Procedure Laterality Date  CESAREAN SECTION    COLONOSCOPY  09/21/2020  LEFT HEART CATHETERIZATION WITH CORONARY ANGIOGRAM N/A 03/08/2012  Procedure: LEFT HEART CATHETERIZATION WITH CORONARY ANGIOGRAM;  Surgeon: Sinclair Grooms, MD;  Location: [facility] CATH LAB;  Service: Cardiovascular;  Laterality: N/A;  LEFT HEART CATHETERIZATION WITH CORONARY ANGIOGRAM N/A 12/17/2012  Procedure: LEFT HEART CATHETERIZATION WITH CORONARY ANGIOGRAM;  Surgeon: Laverda Page, MD;  Location: [facility] CATH LAB;  Service: Cardiovascular;  Laterality: N/A;  LOOP RECORDER INSERTION N/A 03/05/2018  Procedure: LOOP RECORDER INSERTION;  Surgeon: Evans Lance, MD;  Location: [facility] CV LAB;  Service: Cardiovascular;  Laterality: N/A;  LOWER EXTREMITY ANGIOGRAM N/A 12/17/2012  Procedure: LOWER EXTREMITY ANGIOGRAM;  Surgeon: Laverda Page, MD;  Location: [facility] CATH LAB;  Service: Cardiovascular;   Laterality: N/A;  TEE WITHOUT CARDIOVERSION N/A 02/28/2018  Procedure: TRANSESOPHAGEAL ECHOCARDIOGRAM (TEE);  Surgeon: Jerline Pain, MD;  Location: [facility] ENDOSCOPY;  Service: Cardiovascular;  Laterality: N/A;  TUBAL LIGATION   HPI: Pt is a 71 y.o. female who presenting with weakness and lightheadedness. MRI 5/11: Small acute to early subacute infarct in the left aspect of the  medulla. PMH: CVA, DM2, HTN, CKD3b, HLD.  No data recorded  Recommendations for follow up therapy are one component of a multi-disciplinary discharge planning process, led by the attending physician.  Recommendations may be updated based on patient status, additional functional criteria and insurance authorization. Assessment / Plan / Recommendation   03/24/2022  11:59 AM Clinical Impressions Clinical Impression Pt presents with neurogenic pharyngeal dysphagia characterized by reduced tongue base retraction, reduced pharyngeal constriction, and reduced anterior laryngeal movement. She demonstrated incomplete epiglottic inversion, vallecular residue, pyriform sinus residue, posterior pharyngeal wall residue, and PES distension was reduced. Pt was unable to achieve complete epiglottic inversion with secondary swallows and the epiglottis was noted to move only to about a horizontal position. Penetration (PAS 3) and aspiration (PAS 7) of pyriform sinus residue was noted inconsistently with multiple consistencies. Pt's cough was effective in expelling the aspirate, but subsequent laryngeal invasion was noted thereafter due to inadequate laryngeal closure and pharyngeal pressure. Effortful swallows as well as a chin tuck posture both reduced pharyngeal residue,  but not to a functional level. Residue was most significant with nectar thick liquids and with purees. Pt reported these to be the most noxious with regard to effort and pharyngeal "soreness/burning". Secondary swallows were notably less effective in facilitating pharyngeal clearance of purees  and nectar thick liquids, but residue from these was reduced with a thin liquid wash. Esophageal sweep revealed some stasis and slowed movement of barium in the distal esophagus. It is recommended that pt's NPO status be maintained and that short-term enteral nutrition be initiated. Pt may have ice chips after oral care to maintain the integrity of the oral mucosa and for use practice of dysphagia exercises. SLP will follow pt for dysphagia intervention. SLP Visit Diagnosis Dysphagia, pharyngeal phase (R13.13) Impact on safety and function Moderate aspiration risk;Severe aspiration risk     03/24/2022  11:59 AM Treatment Recommendations Treatment Recommendations Therapy as outlined in treatment plan below     03/24/2022  11:59 AM Prognosis Prognosis for Safe Diet Advancement Good Barriers to Reach Goals Cognitive deficits   03/24/2022  11:59 AM Diet Recommendations SLP Diet Recommendations NPO;Ice chips PRN after oral care Medication Administration Via alternative means Postural Changes Seated upright at 90 degrees     03/24/2022  11:59 AM Other Recommendations Oral Care Recommendations Oral care BID Follow Up Recommendations -- Assistance recommended at discharge Intermittent Supervision/Assistance Functional Status Assessment Patient has had a recent decline in their functional status and demonstrates the ability to make significant improvements in function in a reasonable and predictable amount of time.   03/24/2022  11:59 AM Frequency and Duration  Speech Therapy Frequency (ACUTE ONLY) min 2x/week Treatment Duration 2 weeks     03/24/2022  11:59 AM Oral Phase Oral Phase [facility]    03/24/2022  11:59 AM Pharyngeal Phase Pharyngeal Phase Impaired Pharyngeal- Nectar Cup Reduced tongue base retraction;Reduced airway/laryngeal closure;Reduced anterior laryngeal mobility;Reduced epiglottic inversion;Pharyngeal residue - valleculae;Pharyngeal residue - pyriform;Reduced pharyngeal peristalsis;Pharyngeal residue - posterior  pharnyx;Penetration/Apiration after swallow Pharyngeal Material enters airway, remains ABOVE vocal cords and not ejected out Pharyngeal- Thin Cup Reduced tongue base retraction;Reduced airway/laryngeal closure;Reduced anterior laryngeal mobility;Reduced epiglottic inversion;Pharyngeal residue - valleculae;Pharyngeal residue - pyriform;Reduced pharyngeal peristalsis;Pharyngeal residue - posterior pharnyx;Trace aspiration;Penetration/Aspiration during swallow;Penetration/Apiration after swallow Pharyngeal Material enters airway, passes BELOW cords and not ejected out despite cough attempt by patient;Material enters airway, remains ABOVE vocal cords and not ejected out Pharyngeal- Thin Straw Reduced tongue base retraction;Reduced airway/laryngeal closure;Reduced anterior laryngeal mobility;Reduced epiglottic inversion;Pharyngeal residue - valleculae;Pharyngeal residue - pyriform;Reduced pharyngeal peristalsis;Pharyngeal residue - posterior pharnyx;Penetration/Aspiration during swallow;Penetration/Apiration after swallow Pharyngeal Material enters airway, remains ABOVE vocal cords and not ejected out;Material enters airway, passes BELOW cords and not ejected out despite cough attempt by patient Pharyngeal- Puree Reduced tongue base retraction;Reduced airway/laryngeal closure;Reduced anterior laryngeal mobility;Reduced epiglottic inversion;Pharyngeal residue - valleculae;Pharyngeal residue - pyriform;Reduced pharyngeal peristalsis;Pharyngeal residue - posterior pharnyx    03/24/2022  11:59 AM Cervical Esophageal Phase  Cervical Esophageal Phase Impaired Thin Cup Reduced cricopharyngeal relaxation Shanika I. Hardin Negus, [place], [place] Office number [phone] Pager [phone] Horton Marshall 03/24/2022, 1:13 PM                     ECHOCARDIOGRAM COMPLETE  Result Date: 03/24/2022    ECHOCARDIOGRAM REPORT   Patient Name:   SHALANE FLORENDO [facility] Date of Exam: 03/24/2022 Medical Rec #:  [id number]           Height:       62.0 in Accession #:  [id number]         Weight:       160.0 lb Date of Birth:  27-Aug-1951          BSA:          1.739 m Patient Age:    50 years           BP:           186/72 mmHg Patient Gender: F                  HR:           78 bpm. Exam Location:  Inpatient Procedure: 2D Echo Indications:    stroke  History:        Patient has prior history of Echocardiogram examinations, most                 recent 02/28/2018. CAD, chronic kidney disease.; Risk                 Factors:Diabetes, Hypertension and Dyslipidemia.  Sonographer:    [place] Referring Phys: [id number] [place]  1. Left ventricular ejection fraction, by estimation, is 60 to 65%. The left ventricle has normal function. The left ventricle has no regional wall motion abnormalities. There is mild left ventricular hypertrophy. Left ventricular diastolic parameters are consistent with Grade I diastolic dysfunction (impaired relaxation).  2. Right ventricular systolic function is normal. The right ventricular size is normal.  3. The mitral valve is normal in structure. No evidence of mitral valve regurgitation.  4. There is mild calcification of the aortic valve. Aortic valve regurgitation is not visualized.  5. The inferior vena cava is normal in size with greater than 50% respiratory variability, suggesting right atrial pressure of 3 mmHg. FINDINGS  Left Ventricle: Left ventricular ejection fraction, by estimation, is 60 to 65%. The left ventricle has normal function. The left ventricle has no regional wall motion abnormalities. The left ventricular internal cavity size was normal in size. There is  mild left ventricular hypertrophy. Left ventricular diastolic parameters are consistent with Grade I diastolic dysfunction (impaired relaxation). Right Ventricle: The right ventricular size is normal. No increase in right ventricular wall thickness. Right ventricular systolic function is normal. Left Atrium:  Left atrial size was normal in size. Right Atrium: Right atrial size was normal in size. Pericardium: There is no evidence of pericardial effusion. Mitral Valve: The mitral valve is normal in structure. No evidence of mitral valve regurgitation. Tricuspid Valve: The tricuspid valve is normal in structure. Tricuspid valve regurgitation is not demonstrated. Aortic Valve: There is mild calcification of the aortic valve. Aortic valve regurgitation is not visualized. Pulmonic Valve: The pulmonic valve was normal in structure. Pulmonic valve regurgitation is not visualized. Aorta: The aortic root and ascending aorta are structurally normal, with no evidence of dilitation. Venous: The inferior vena cava is normal in size with greater than 50% respiratory variability, suggesting right atrial pressure of 3 mmHg. IAS/Shunts: No atrial level shunt detected by color flow Doppler.  LEFT VENTRICLE PLAX 2D LVIDd:         4.20 cm   Diastology LVIDs:         2.60 cm   LV e' medial:    4.24 cm/s LV PW:         0.90 cm   LV E/e' medial:  16.2 LV IVS:        1.10 cm   LV e' lateral:  8.92 cm/s LVOT diam:     1.70 cm   LV E/e' lateral: 7.7 LV SV:         43 LV SV Index:   25 LVOT Area:     2.27 cm  RIGHT VENTRICLE             IVC RV S prime:     11.50 cm/s  IVC diam: 1.50 cm LEFT ATRIUM             Index        RIGHT ATRIUM          Index LA diam:        3.30 cm 1.90 cm/m   RA Area:     9.46 cm LA Vol (A2C):   32.5 ml 18.69 ml/m  RA Volume:   17.80 ml 10.24 ml/m LA Vol (A4C):   34.0 ml 19.55 ml/m LA Biplane Vol: 35.1 ml 20.19 ml/m  AORTIC VALVE LVOT Vmax:   103.00 cm/s LVOT Vmean:  66.100 cm/s LVOT VTI:    0.190 m  AORTA Ao Root diam: 2.50 cm Ao Asc diam:  2.90 cm MITRAL VALVE MV Area (PHT): 3.37 cm     SHUNTS MV Decel Time: 225 msec     Systemic VTI:  0.19 m MV E velocity: 68.60 cm/s   Systemic Diam: 1.70 cm MV A velocity: 131.00 cm/s MV E/A ratio:  0.52 Landscape architect signed by Phineas Inches Signature Date/Time:  03/24/2022/11:51:27 AM    Final    VAS [place] LOWER EXTREMITY VENOUS (DVT)  Result Date: 03/29/2022  Lower Venous DVT Study Patient Name:  GELILA WELL [facility]  Date of Exam:   03/29/2022 Medical Rec #: [id number]           Accession #:    [id number] Date of Birth: Oct 31, 1951           Patient Gender: F Patient Age:   85 years Exam Location:  [facility] Procedure:      VAS [place] LOWER EXTREMITY VENOUS (DVT) Referring Phys: Cornelius Moras XU --------------------------------------------------------------------------------  Indications: Pain.  Risk Factors: None identified. Limitations: Poor ultrasound/tissue interface. Comparison Study: No prior studies. Performing Technologist: Oliver Hum RVT  Examination Guidelines: A complete evaluation includes B-mode imaging, spectral Doppler, color Doppler, and power Doppler as needed of all accessible portions of each vessel. Bilateral testing is considered an integral part of a complete examination. Limited examinations for reoccurring indications may be performed as noted. The reflux portion of the exam is performed with the patient in reverse Trendelenburg.  +---------+---------------+---------+-----------+----------+--------------+ RIGHT    CompressibilityPhasicitySpontaneityPropertiesThrombus Aging +---------+---------------+---------+-----------+----------+--------------+ CFV      Full           Yes      Yes                                 +---------+---------------+---------+-----------+----------+--------------+ SFJ      Full                                                        +---------+---------------+---------+-----------+----------+--------------+ FV Prox  Full                                                        +---------+---------------+---------+-----------+----------+--------------+  FV Mid   Full                                                         +---------+---------------+---------+-----------+----------+--------------+ FV DistalFull                                                        +---------+---------------+---------+-----------+----------+--------------+ PFV      Full                                                        +---------+---------------+---------+-----------+----------+--------------+ POP      Full           Yes      Yes                                 +---------+---------------+---------+-----------+----------+--------------+ PTV      Full                                                        +---------+---------------+---------+-----------+----------+--------------+ PERO     Full                                                        +---------+---------------+---------+-----------+----------+--------------+   +---------+---------------+---------+-----------+----------+--------------+ LEFT     CompressibilityPhasicitySpontaneityPropertiesThrombus Aging +---------+---------------+---------+-----------+----------+--------------+ CFV      Full           Yes      Yes                                 +---------+---------------+---------+-----------+----------+--------------+ SFJ      Full                                                        +---------+---------------+---------+-----------+----------+--------------+ FV Prox  Full                                                        +---------+---------------+---------+-----------+----------+--------------+ FV Mid   Full                                                        +---------+---------------+---------+-----------+----------+--------------+  FV DistalFull                                                        +---------+---------------+---------+-----------+----------+--------------+ PFV      Full                                                         +---------+---------------+---------+-----------+----------+--------------+ POP      Full           Yes      Yes                                 +---------+---------------+---------+-----------+----------+--------------+ PTV      Full                                                        +---------+---------------+---------+-----------+----------+--------------+ PERO     Full                                                        +---------+---------------+---------+-----------+----------+--------------+     Summary: RIGHT: - There is no evidence of deep vein thrombosis in the lower extremity. However, portions of this examination were limited- see technologist comments above.  - No cystic structure found in the popliteal fossa.  LEFT: - There is no evidence of deep vein thrombosis in the lower extremity. However, portions of this examination were limited- see technologist comments above.  - No cystic structure found in the popliteal fossa.  *See table(s) above for measurements and observations. Electronically signed by Jamelle Haring on 03/29/2022 at 9:19:17 PM.    Final     Microbiology: Results for orders placed or performed during the hospital encounter of 03/23/22  MRSA Next Gen by PCR, Nasal     Status: None   Collection Time: 03/26/22  3:37 AM   Specimen: Nasal Mucosa; Nasal Swab  Result Value Ref Range Status   MRSA by PCR Next Gen NOT DETECTED NOT DETECTED Final    Comment: (NOTE) The GeneXpert MRSA Assay (FDA approved for NASAL specimens only), is one component of a comprehensive MRSA colonization surveillance program. It is not intended to diagnose MRSA infection nor to guide or monitor treatment for MRSA infections. Test performance is not FDA approved in patients less than 35 years old. Performed at [facility], [place] [address] [place], [place] [address]   Culture, Respiratory w Gram Stain     Status: None   Collection Time: 03/27/22  3:36 PM   Specimen:  Tracheal Aspirate; Respiratory  Result Value Ref Range Status   Specimen Description TRACHEAL ASPIRATE  Final   Special Requests NONE  Final   Gram Stain   Final    ABUNDANT WBC PRESENT,BOTH PMN AND MONONUCLEAR NO ORGANISMS  SEEN    Culture   Final    NO GROWTH 2 DAYS Performed at [facility], [place] [address], [place], [place] [address]    Report Status 03/30/2022 FINAL  Final  MRSA Next Gen by PCR, Nasal     Status: None   Collection Time: 04/06/22 11:21 AM   Specimen: Nasal Mucosa; Nasal Swab  Result Value Ref Range Status   MRSA by PCR Next Gen NOT DETECTED NOT DETECTED Final    Comment: (NOTE) The GeneXpert MRSA Assay (FDA approved for NASAL specimens only), is one component of a comprehensive MRSA colonization surveillance program. It is not intended to diagnose MRSA infection nor to guide or monitor treatment for MRSA infections. Test performance is not FDA approved in patients less than 31 years old. Performed at [facility], [place] [address] [place], [place] [address]     Labs: CBC: Recent Labs  Lab 04/15/22 0036 04/15/22 0924 04/16/22 0025 04/16/22 1230 04/18/22 0156  WBC 5.9 5.3 5.9 6.4 5.4  NEUTROABS  --  3.1  --  3.6 2.9  HGB 7.0* 7.1* 7.0* 7.3* 7.2*  HCT 22.2* 22.4* 22.1* 23.4* 23.0*  MCV 90.6 90.7 90.9 91.8 91.3  PLT 314 301 331 309 007   Basic Metabolic Panel: Recent Labs  Lab 04/12/22 0543 04/13/22 0108 04/14/22 0053 04/15/22 0036 04/16/22 0025 04/18/22 0156  NA 139 138 138 135 137 136  K 4.3 4.1 4.5 4.6 4.8 4.9  CL 104 104 100 97* 98 100  CO2 '28 26 28 28 31 29  '$ GLUCOSE 110* 175* 171* 217* 144* 142*  BUN 23 24* 24* 24* 27* 29*  CREATININE 1.23* 1.21* 1.22* 1.26* 1.43* 1.50*  CALCIUM 8.7* 8.9 8.9 8.8* 9.0 8.9  MG 2.2 2.3 2.3 2.2  --   --    Liver Function Tests: Recent Labs  Lab 04/14/22 0053 04/15/22 0036 04/18/22 0156  AST '26 28 21  '$ ALT '15 17 15  '$ ALKPHOS 80 87 87  BILITOT 0.3 0.1* 0.5  PROT 6.8 7.0 7.2  ALBUMIN  2.5* 2.5* 2.5*   CBG: Recent Labs  Lab 04/17/22 0840 04/17/22 1138 04/17/22 1640 04/17/22 2151 04/18/22 0723  GLUCAP 114* 261* 309* 187* 100*    Discharge time spent: greater than 30 minutes.  Signed: Flora Lipps, MD [facility] 04/18/2022

## 2022-04-18 NOTE — Progress Notes (Signed)
Speech Language Pathology Treatment:    Patient Details Name: [name] MRN: [id number] DOB: 06/06/1951 Today's Date: 04/18/2022 Time:  -     Pt being discharged to [facility] today where ST can continue pt treatment for PMV, dysarthria, swallow and cognition. Will discharge from acute ST services.                            Houston Siren  04/18/2022, 10:40 AM

## 2022-04-18 NOTE — Progress Notes (Signed)
RT was called due to patient pulling out her trach. Trach reinserted and secured by RTx2 at this time. Good color change noted on CO2 detector. Equal breath sounds. Vitals stable. RT will continue to monitor.

## 2022-04-18 NOTE — Plan of Care (Signed)
Patient has met all goals and is adequate for discharge. Report will be called to 5E27.

## 2022-04-18 NOTE — Progress Notes (Signed)
Palliative Medicine Progress Note   Patient Name: [name]       Date: 04/18/2022 DOB: 1951-09-20  Age: 71 y.o. MRN#: [id number] Attending Physician: Flora Lipps, MD Primary Care Physician: Buzzy Han, MD (Inactive) Admit Date: 03/23/2022    HPI/Patient Profile: 71 y.o. female  with past medical history of prior CVA, DM 2 (poorly controlled), chronic kidney disease stage IIIb, hypertension, and hyperlipidemia.  She presented to the emergency department on 03/23/2022 with weakness and dizziness.  She was found to have an acute left medullary stroke and was admitted to the hospitalist service with stroke team following.   5/14 - she went into cardiac arrest and was intubated likely secondary to aspiration event 5/19 - she had another brief cardiac arrest thought secondary to vagal response while administering an enema 5/20 - extubated, and reintubated for stridor 5/22 - trach 5/23 - PEG 5/24 - sinus bradycardia with pauses; started dopamine 5/25 - temporary pacemaker insertion 5/26-5/27- tolerating trach collar 5/28 - pulled out pacemaker wires, but able to maintain normal HR 6/1 - transferred back to ICU for secretion management   Subjective: Chart reviewed. Patient pulled out her trach overnight. RT replaced trach with no difficulty.   I went to see patient at bedside.  She is sleeping comfortably and I did not attempt to wake her.  I spoke with patient's daughter [name] by phone.  I let her know that [name] had pulled out her trach overnight.  Discussed that this type of episode is a barrier to care and clinical progress.  [name] verbalizes understanding that her mother needs to "act right" in order to improve.  Discussed that [name] has been accepted at [facility] and will transfer today.  [name] is grateful and anticipates that her mother will not need additional rehab after her stay at [facility].  [name] ultimately remains hopeful for improvement to the point her mother will not need the trach or feeding tube (PEG).  Discussed the importance of continued conversation with the medical team regarding overall plan of care and treatment options, ensuring decisions are consistent with patient's values and goals of care.   Objective:  Physical Exam Vitals reviewed.  Constitutional:      General: She is sleeping. She is not in acute distress.    Appearance: She is  ill-appearing.  Cardiovascular:     Rate and Rhythm: Normal rate.  Pulmonary:     Effort: Pulmonary effort is normal.     Comments: Trach in place with oxygen via trach collar           Vital Signs: BP (!) 145/60   Pulse 87   Temp 98.5 F (36.9 C) (Axillary)   Resp (!) 23   Ht '5\' 2"'$  (1.575 m)   Wt 69.9 kg   SpO2 95%   BMI 28.19 kg/m  SpO2: SpO2: 95 % O2 Device: O2 Device: Tracheostomy Collar O2 Flow Rate: O2 Flow Rate (L/min): 10 L/min   LBM: Last BM Date : 04/18/22     Palliative Medicine Assessment & Plan   Assessment: Principal Problem:   CVA (cerebral vascular accident) ([place]) Active Problems:   Hypothyroidism   DM2 (diabetes mellitus, type 2) (HCC)   HLD (hyperlipidemia)   HTN (hypertension)   Stage 3b chronic kidney disease (CKD) (HCC)   GERD (gastroesophageal reflux disease)   Normocytic anemia   Dysphagia due to recent stroke   CAD (coronary artery disease)   Acute respiratory failure ([place])    Recommendations/Plan: Full code full scope Daughter is hopeful for improvement Pending transfer to [facility] today   Prognosis:  Unable to determine    Thank you for allowing the Palliative Medicine Team to assist in the care of this patient.   MDM - moderate   Lavena Bullion, NP   Please contact Palliative Medicine  Team phone at [phone] for questions and concerns.  For individual providers, please see AMION.

## 2022-04-19 LAB — CBC WITH DIFFERENTIAL/PLATELET
Abs Immature Granulocytes: 0.02 10*3/uL (ref 0.00–0.07)
Abs Immature Granulocytes: 0.03 10*3/uL (ref 0.00–0.07)
Basophils Absolute: 0 10*3/uL (ref 0.0–0.1)
Basophils Absolute: 0 10*3/uL (ref 0.0–0.1)
Basophils Relative: 1 %
Basophils Relative: 0 %
Eosinophils Absolute: 0.1 10*3/uL (ref 0.0–0.5)
Eosinophils Absolute: 0.1 10*3/uL (ref 0.0–0.5)
Eosinophils Relative: 2 %
Eosinophils Relative: 2 %
HCT: 21.8 % — ABNORMAL LOW (ref 36.0–46.0)
HCT: 27.5 % — ABNORMAL LOW (ref 36.0–46.0)
Hemoglobin: 6.8 g/dL — CL (ref 12.0–15.0)
Hemoglobin: 8.7 g/dL — ABNORMAL LOW (ref 12.0–15.0)
Immature Granulocytes: 0 %
Immature Granulocytes: 0 %
Lymphocytes Relative: 32 %
Lymphocytes Relative: 26 %
Lymphs Abs: 1.7 10*3/uL (ref 0.7–4.0)
Lymphs Abs: 1.9 10*3/uL (ref 0.7–4.0)
MCH: 28.2 pg (ref 26.0–34.0)
MCH: 28 pg (ref 26.0–34.0)
MCHC: 31.2 g/dL (ref 30.0–36.0)
MCHC: 31.6 g/dL (ref 30.0–36.0)
MCV: 90.5 fL (ref 80.0–100.0)
MCV: 88.4 fL (ref 80.0–100.0)
Monocytes Absolute: 0.9 10*3/uL (ref 0.1–1.0)
Monocytes Absolute: 0.8 10*3/uL (ref 0.1–1.0)
Monocytes Relative: 18 %
Monocytes Relative: 11 %
Neutro Abs: 2.5 10*3/uL (ref 1.7–7.7)
Neutro Abs: 4.5 10*3/uL (ref 1.7–7.7)
Neutrophils Relative %: 47 %
Neutrophils Relative %: 61 %
Platelets: 323 10*3/uL (ref 150–400)
Platelets: 316 10*3/uL (ref 150–400)
RBC: 2.41 MIL/uL — ABNORMAL LOW (ref 3.87–5.11)
RBC: 3.11 MIL/uL — ABNORMAL LOW (ref 3.87–5.11)
RDW: 14.8 % (ref 11.5–15.5)
RDW: 17.3 % — ABNORMAL HIGH (ref 11.5–15.5)
WBC: 5.2 10*3/uL (ref 4.0–10.5)
WBC: 7.4 10*3/uL (ref 4.0–10.5)
nRBC: 0.6 % — ABNORMAL HIGH (ref 0.0–0.2)
nRBC: 0.3 % — ABNORMAL HIGH (ref 0.0–0.2)

## 2022-04-19 LAB — COMPREHENSIVE METABOLIC PANEL
ALT: 15 U/L (ref 0–44)
AST: 22 U/L (ref 15–41)
Albumin: 2.4 g/dL — ABNORMAL LOW (ref 3.5–5.0)
Alkaline Phosphatase: 83 U/L (ref 38–126)
Anion gap: 10 (ref 5–15)
BUN: 34 mg/dL — ABNORMAL HIGH (ref 8–23)
CO2: 30 mmol/L (ref 22–32)
Calcium: 8.7 mg/dL — ABNORMAL LOW (ref 8.9–10.3)
Chloride: 95 mmol/L — ABNORMAL LOW (ref 98–111)
Creatinine, Ser: 1.64 mg/dL — ABNORMAL HIGH (ref 0.44–1.00)
GFR, Estimated: 33 mL/min — ABNORMAL LOW (ref >60)
Glucose, Bld: 210 mg/dL — ABNORMAL HIGH (ref 70–99)
Potassium: 4.9 mmol/L (ref 3.5–5.1)
Sodium: 135 mmol/L (ref 135–145)
Total Bilirubin: 0.4 mg/dL (ref 0.3–1.2)
Total Protein: 6.9 g/dL (ref 6.5–8.1)

## 2022-04-19 LAB — ABO/RH: ABO/RH(D): O POS

## 2022-04-19 LAB — PREPARE RBC (CROSSMATCH)

## 2022-04-20 DIAGNOSIS — J9621 Acute and chronic respiratory failure with hypoxia: Secondary | ICD-10-CM

## 2022-04-20 DIAGNOSIS — Z930 Tracheostomy status: Secondary | ICD-10-CM

## 2022-04-20 DIAGNOSIS — I69391 Dysphagia following cerebral infarction: Secondary | ICD-10-CM

## 2022-04-20 DIAGNOSIS — I63411 Cerebral infarction due to embolism of right middle cerebral artery: Secondary | ICD-10-CM

## 2022-04-20 DIAGNOSIS — J690 Pneumonitis due to inhalation of food and vomit: Secondary | ICD-10-CM

## 2022-04-20 LAB — BPAM RBC
Blood Product Expiration Date: 202306082359
ISSUE DATE / TIME: 202306070925
Unit Type and Rh: 9500

## 2022-04-20 LAB — CBC WITH DIFFERENTIAL/PLATELET
Abs Immature Granulocytes: 0.03 10*3/uL (ref 0.00–0.07)
Abs Immature Granulocytes: 0.04 10*3/uL (ref 0.00–0.07)
Basophils Absolute: 0 10*3/uL (ref 0.0–0.1)
Basophils Absolute: 0 10*3/uL (ref 0.0–0.1)
Basophils Relative: 0 %
Basophils Relative: 1 %
Eosinophils Absolute: 0.2 10*3/uL (ref 0.0–0.5)
Eosinophils Absolute: 0.3 10*3/uL (ref 0.0–0.5)
Eosinophils Relative: 3 %
Eosinophils Relative: 4 %
HCT: 26.3 % — ABNORMAL LOW (ref 36.0–46.0)
HCT: 28.3 % — ABNORMAL LOW (ref 36.0–46.0)
Hemoglobin: 8.5 g/dL — ABNORMAL LOW (ref 12.0–15.0)
Hemoglobin: 8.9 g/dL — ABNORMAL LOW (ref 12.0–15.0)
Immature Granulocytes: 1 %
Immature Granulocytes: 1 %
Lymphocytes Relative: 27 %
Lymphocytes Relative: 32 %
Lymphs Abs: 1.6 10*3/uL (ref 0.7–4.0)
Lymphs Abs: 2 10*3/uL (ref 0.7–4.0)
MCH: 27.5 pg (ref 26.0–34.0)
MCH: 28.3 pg (ref 26.0–34.0)
MCHC: 31.4 g/dL (ref 30.0–36.0)
MCHC: 32.3 g/dL (ref 30.0–36.0)
MCV: 87.3 fL (ref 80.0–100.0)
MCV: 87.7 fL (ref 80.0–100.0)
Monocytes Absolute: 0.6 10*3/uL (ref 0.1–1.0)
Monocytes Absolute: 1 10*3/uL (ref 0.1–1.0)
Monocytes Relative: 11 %
Monocytes Relative: 15 %
Neutro Abs: 3.1 10*3/uL (ref 1.7–7.7)
Neutro Abs: 3.4 10*3/uL (ref 1.7–7.7)
Neutrophils Relative %: 47 %
Neutrophils Relative %: 58 %
Platelets: 312 10*3/uL (ref 150–400)
Platelets: 340 10*3/uL (ref 150–400)
RBC: 3 MIL/uL — ABNORMAL LOW (ref 3.87–5.11)
RBC: 3.24 MIL/uL — ABNORMAL LOW (ref 3.87–5.11)
RDW: 17.5 % — ABNORMAL HIGH (ref 11.5–15.5)
RDW: 18 % — ABNORMAL HIGH (ref 11.5–15.5)
WBC: 5.9 10*3/uL (ref 4.0–10.5)
WBC: 6.4 10*3/uL (ref 4.0–10.5)
nRBC: 0.5 % — ABNORMAL HIGH (ref 0.0–0.2)
nRBC: 0.5 % — ABNORMAL HIGH (ref 0.0–0.2)

## 2022-04-20 LAB — TYPE AND SCREEN
ABO/RH(D): O POS
Antibody Screen: NEGATIVE
Unit division: 0

## 2022-04-20 NOTE — Progress Notes (Addendum)
Pulmonary Critical Care Medicine [place]   PULMONARY CRITICAL CARE SERVICE  PROGRESS NOTE     [name]  ZOX:[id number]  DOB: 11-22-1950   DOA: 04/18/2022  Referring Physician: Satira Sark, MD  HPI: [name] is a 71 y.o. female being followed for ventilator/airway/oxygen weaning Acute on Chronic Respiratory Failure.  Patient seen lying in bed, currently on ATC 35% with PMV in place.  She does have a strong productive cough and able to clear secretions.  She continues to bradycardia down with suctioning and have sinus pauses during sleep.  This was addressed with internal medicine and cardiology yesterday, no further intervention deemed necessary, was given atropine once yesterday.  No acute distress, no acute overnight events.    Past Medical History:  Diagnosis Date   Anemia     Arteriosclerotic cardiovascular disease     CKD (chronic kidney disease) stage 3, GFR 30-59 ml/min (HCC)     Claudication in peripheral vascular disease (HCC)     CVA (cerebral vascular accident) ([place])      2012 and 2019-affected left side + dysarthria 2/2 severe R ACA and MCA stenosis   GERD (gastroesophageal reflux disease)     Hyperlipidemia      declines statins   Hypertension     Hypothyroidism     Type 2 diabetes mellitus ([place]) 2000   Vitamin D deficiency           Past Surgical History:  Procedure Laterality Date   CESAREAN SECTION       COLONOSCOPY   09/21/2020   LEFT HEART CATHETERIZATION WITH CORONARY ANGIOGRAM N/A 03/08/2012    Procedure: LEFT HEART CATHETERIZATION WITH CORONARY ANGIOGRAM;  Surgeon: Sinclair Grooms, MD;  Location: [facility] CATH LAB;  Service: Cardiovascular;  Laterality: N/A;   LEFT HEART CATHETERIZATION WITH CORONARY ANGIOGRAM N/A 12/17/2012    Procedure: LEFT HEART CATHETERIZATION WITH CORONARY ANGIOGRAM;  Surgeon: Laverda Page, MD;  Location: [facility] CATH LAB;  Service: Cardiovascular;  Laterality: N/A;   LOOP RECORDER INSERTION  N/A 03/05/2018    Procedure: LOOP RECORDER INSERTION;  Surgeon: Evans Lance, MD;  Location: [place] CV LAB;  Service: Cardiovascular;  Laterality: N/A;   LOWER EXTREMITY ANGIOGRAM N/A 12/17/2012    Procedure: LOWER EXTREMITY ANGIOGRAM;  Surgeon: Laverda Page, MD;  Location: [facility] CATH LAB;  Service: Cardiovascular;  Laterality: N/A;   TEE WITHOUT CARDIOVERSION N/A 02/28/2018    Procedure: TRANSESOPHAGEAL ECHOCARDIOGRAM (TEE);  Surgeon: Jerline Pain, MD;  Location: [facility] ENDOSCOPY;  Service: Cardiovascular;  Laterality: N/A;   TUBAL LIGATION        Social History:  reports that she quit smoking about 11 years ago. She has never used smokeless tobacco. She reports that she does not drink alcohol and does not use drugs.        Allergies  Allergen Reactions   Dilantin [Phenytoin Sodium Extended]        Rash/itching   Insulins Rash      Humalog 75/25 only   Statins Nausea Only and Rash      "sick to my stomach"; describes "rash and a lump" while taking statins           Family History  Problem Relation Age of Onset   Diabetes Brother     Prostate cancer Brother     Diabetes Mother     Diabetes Father     Diabetes Sister     Esophageal cancer Neg Hx  Rectal cancer Neg Hx     Stomach cancer Neg Hx     Colon cancer Neg Hx     Breast cancer Neg Hx      Medications: Reviewed on Rounds  Physical Exam:  Vitals: Temp 99.1, pulse 73, respirations 21, BP 145/60, SPO2 97%  Ventilator Settings ATC 35% with PMV in place  General: Comfortable at this time Neck: supple Cardiovascular: no malignant arrhythmias Respiratory: Bilaterally clear Skin: no rash seen on limited exam Musculoskeletal: No gross abnormality Psychiatric:unable to assess Neurologic:no involuntary movements         Lab Data:   Basic Metabolic Panel: Recent Labs  Lab 04/14/22 0053 04/15/22 0036 04/16/22 0025 04/18/22 0156 04/19/22 0506  NA 138 135 137 136 135  K 4.5 4.6 4.8 4.9 4.9  CL 100 97*  98 100 95*  CO2 '28 28 31 29 30  '$ GLUCOSE 171* 217* 144* 142* 210*  BUN 24* 24* 27* 29* 34*  CREATININE 1.22* 1.26* 1.43* 1.50* 1.64*  CALCIUM 8.9 8.8* 9.0 8.9 8.7*  MG 2.3 2.2  --   --   --     ABG: No results for input(s): "PHART", "PCO2ART", "PO2ART", "HCO3", "O2SAT" in the last 168 hours.  Liver Function Tests: Recent Labs  Lab 04/14/22 0053 04/15/22 0036 04/18/22 0156 04/19/22 0506  AST '26 28 21 22  '$ ALT '15 17 15 15  '$ ALKPHOS 80 87 87 83  BILITOT 0.3 0.1* 0.5 0.4  PROT 6.8 7.0 7.2 6.9  ALBUMIN 2.5* 2.5* 2.5* 2.4*   No results for input(s): "LIPASE", "AMYLASE" in the last 168 hours. No results for input(s): "AMMONIA" in the last 168 hours.  CBC: Recent Labs  Lab 04/18/22 0156 04/19/22 0506 04/19/22 1503 04/19/22 2337 04/20/22 0443  WBC 5.4 5.2 7.4 5.9 6.4  NEUTROABS 2.9 2.5 4.5 3.4 3.1  HGB 7.2* 6.8* 8.7* 8.5* 8.9*  HCT 23.0* 21.8* 27.5* 26.3* 28.3*  MCV 91.3 90.5 88.4 87.7 87.3  PLT 319 323 316 312 340    Cardiac Enzymes: No results for input(s): "CKTOTAL", "CKMB", "CKMBINDEX", "TROPONINI" in the last 168 hours.  BNP (last 3 results) Recent Labs    04/01/22 0052  BNP 81.4    ProBNP (last 3 results) No results for input(s): "PROBNP" in the last 8760 hours.  Radiological Exams: DG ABDOMEN PEG TUBE LOCATION  Result Date: 04/18/2022 CLINICAL DATA:  Peg tube evaluation EXAM: ABDOMEN - 1 VIEW COMPARISON:  03/30/2022, 06/24/2007 FINDINGS: Contrast administered through patient's percutaneous gastrostomy tube with contrast filling the gastric lumen. Balloon appears located within the distal stomach. No contrast is seen extending into the duodenum. No extraluminal contrast collections linear metallic density within the right hemipelvis, stable since at least 2008. IMPRESSION: Percutaneous gastrostomy tube is positioned within the stomach. No contrast is seen extending into the duodenum. Follow-up abdominal radiograph suggested to exclude outlet obstruction.  Electronically Signed   By: Davina Poke D.O.   On: 04/18/2022 15:12   DG CHEST PORT 1 VIEW  Result Date: 04/18/2022 CLINICAL DATA:  Tracheostomy placement. EXAM: PORTABLE CHEST 1 VIEW COMPARISON:  04/18/2022, earlier same day FINDINGS: 1447 hours. Low lung volumes. The lungs are clear without focal pneumonia, edema, pneumothorax or pleural effusion. Patchy atelectasis noted left base, decreased in the interval. The cardiopericardial silhouette is within normal limits for size. Tracheostomy tube is in similar position with tip projecting about 4.6 cm above the base of the carina. Telemetry leads overlie the chest. IMPRESSION: Tracheostomy tube tip projects 4.6 cm above the  base of the carina. Electronically Signed   By: Misty Stanley M.D.   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  She does have a good strong productive cough.  Continue with secretion management and pulmonary toilet. Status post tracheostomy-trach remains in place.  Continue with trach care per protocol. Dysphagia-status post left medulla area stroke.  Patient remains high risk for aspiration events.  Aspiration precautions in place. Aspiration pneumonia-currently resolved prior to admission.  Patient remains at high risk for aspiration due to dysphagia.  Continue with aspiration precautions.   I have personally seen and evaluated the patient, evaluated laboratory and imaging results, formulated the assessment and plan and placed orders. The Patient requires high complexity decision making with multiple systems involvement.  Rounds were done with the Respiratory Therapy Director and Staff therapists  and discussed with nursing staff also.  Allyne Gee, MD [facility] Pulmonary Critical Care Medicine Sleep Medicine

## 2022-04-21 DIAGNOSIS — Z930 Tracheostomy status: Secondary | ICD-10-CM

## 2022-04-21 NOTE — Progress Notes (Signed)
Pulmonary Critical Care Medicine [place]   PULMONARY CRITICAL CARE SERVICE  PROGRESS NOTE     [name]  ELF:[id number]  DOB: November 07, 1951   DOA: 04/18/2022  Referring Physician: Satira Sark, MD  HPI: [name] is a 71 y.o. female being followed for ventilator/airway/oxygen weaning Acute on Chronic Respiratory Failure.  Patient is on T collar at this time on 28% FiO2 saturations are good  Medications: Reviewed on Rounds  Physical Exam:  Vitals: Temperature is 97.7 pulse 78 respiratory 30 blood pressure is 122/60 saturations 97%  Ventilator Settings on T collar FiO2 28%  General: Comfortable at this time Neck: supple Cardiovascular: no malignant arrhythmias Respiratory: Scattered rhonchi expansion is equal Skin: no rash seen on limited exam Musculoskeletal: No gross abnormality Psychiatric:unable to assess Neurologic:no involuntary movements         Lab Data:   Basic Metabolic Panel: Recent Labs  Lab 04/15/22 0036 04/16/22 0025 04/18/22 0156 04/19/22 0506  NA 135 137 136 135  K 4.6 4.8 4.9 4.9  CL 97* 98 100 95*  CO2 '28 31 29 30  '$ GLUCOSE 217* 144* 142* 210*  BUN 24* 27* 29* 34*  CREATININE 1.26* 1.43* 1.50* 1.64*  CALCIUM 8.8* 9.0 8.9 8.7*  MG 2.2  --   --   --     ABG: No results for input(s): "PHART", "PCO2ART", "PO2ART", "HCO3", "O2SAT" in the last 168 hours.  Liver Function Tests: Recent Labs  Lab 04/15/22 0036 04/18/22 0156 04/19/22 0506  AST '28 21 22  '$ ALT '17 15 15  '$ ALKPHOS 87 87 83  BILITOT 0.1* 0.5 0.4  PROT 7.0 7.2 6.9  ALBUMIN 2.5* 2.5* 2.4*   No results for input(s): "LIPASE", "AMYLASE" in the last 168 hours. No results for input(s): "AMMONIA" in the last 168 hours.  CBC: Recent Labs  Lab 04/18/22 0156 04/19/22 0506 04/19/22 1503 04/19/22 2337 04/20/22 0443  WBC 5.4 5.2 7.4 5.9 6.4  NEUTROABS 2.9 2.5 4.5 3.4 3.1  HGB 7.2* 6.8* 8.7* 8.5* 8.9*  HCT 23.0* 21.8* 27.5* 26.3* 28.3*  MCV  91.3 90.5 88.4 87.7 87.3  PLT 319 323 316 312 340    Cardiac Enzymes: No results for input(s): "CKTOTAL", "CKMB", "CKMBINDEX", "TROPONINI" in the last 168 hours.  BNP (last 3 results) Recent Labs    04/01/22 0052  BNP 81.4    ProBNP (last 3 results) No results for input(s): "PROBNP" in the last 8760 hours.  Radiological Exams: No results found.  Assessment/Plan Active Problems:   Dysphagia as late effect of stroke   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 (HCC)   Acute on chronic respiratory failure with hypoxia (HCC)   Aspiration pneumonia (HCC)   Tracheostomy status (HCC)   Acute on chronic respiratory failure hypoxia plan is going to be to continue with the T-piece secretions are fairly moderate titrate oxygen as tolerated continue pulmonary toilet Dysphagia no change supportive care Acute stroke physical therapy supportive care Aspiration pneumonia patient remains at risk for aspiration Tracheostomy remains in place   I have personally seen and evaluated the patient, evaluated laboratory and imaging results, formulated the assessment and plan and placed orders. The Patient requires high complexity decision making with multiple systems involvement.  Rounds were done with the Respiratory Therapy Director and Staff therapists and discussed with nursing staff also.  Allyne Gee, MD [facility] Pulmonary Critical Care Medicine Sleep Medicine

## 2022-04-22 LAB — BASIC METABOLIC PANEL
Anion gap: 12 (ref 5–15)
BUN: 43 mg/dL — ABNORMAL HIGH (ref 8–23)
CO2: 29 mmol/L (ref 22–32)
Calcium: 9.2 mg/dL (ref 8.9–10.3)
Chloride: 96 mmol/L — ABNORMAL LOW (ref 98–111)
Creatinine, Ser: 1.8 mg/dL — ABNORMAL HIGH (ref 0.44–1.00)
GFR, Estimated: 30 mL/min — ABNORMAL LOW (ref >60)
Glucose, Bld: 204 mg/dL — ABNORMAL HIGH (ref 70–99)
Potassium: 5.2 mmol/L — ABNORMAL HIGH (ref 3.5–5.1)
Sodium: 137 mmol/L (ref 135–145)

## 2022-04-22 LAB — MAGNESIUM: Magnesium: 2.5 mg/dL — ABNORMAL HIGH (ref 1.7–2.4)

## 2022-04-22 LAB — TSH: TSH: 8.797 u[IU]/mL — ABNORMAL HIGH (ref 0.350–4.500)

## 2022-04-22 LAB — HEMOGLOBIN A1C
Hgb A1c MFr Bld: 7.6 % — ABNORMAL HIGH (ref 4.8–5.6)
Mean Plasma Glucose: 171.42 mg/dL

## 2022-04-22 LAB — T4, FREE: Free T4: 0.37 ng/dL — ABNORMAL LOW (ref 0.61–1.12)

## 2022-04-23 LAB — BASIC METABOLIC PANEL
Anion gap: 10 (ref 5–15)
BUN: 38 mg/dL — ABNORMAL HIGH (ref 8–23)
CO2: 29 mmol/L (ref 22–32)
Calcium: 9.1 mg/dL (ref 8.9–10.3)
Chloride: 98 mmol/L (ref 98–111)
Creatinine, Ser: 1.48 mg/dL — ABNORMAL HIGH (ref 0.44–1.00)
GFR, Estimated: 38 mL/min — ABNORMAL LOW (ref >60)
Glucose, Bld: 162 mg/dL — ABNORMAL HIGH (ref 70–99)
Potassium: 4.5 mmol/L (ref 3.5–5.1)
Sodium: 137 mmol/L (ref 135–145)

## 2022-04-24 LAB — CULTURE, RESPIRATORY W GRAM STAIN: Culture: NORMAL

## 2022-04-24 NOTE — Progress Notes (Signed)
Pulmonary Critical Care Medicine [place]   PULMONARY CRITICAL CARE SERVICE  PROGRESS NOTE     [name]  RCB:[id number]  DOB: 11-Aug-1951   DOA: 04/18/2022  Referring Physician: Satira Sark, MD  HPI: [name] is a 71 y.o. female being followed for ventilator/airway/oxygen weaning Acute on Chronic Respiratory Failure.  Patient is on T collar currently on 28% FiO2 has been using the PMV  Medications: Reviewed on Rounds  Physical Exam:  Vitals: Temperature is 97.6 pulse 67 respiratory 27 blood pressure is 158/74 saturations 98%  Ventilator Settings on T collar FiO2 is 28% PMV  General: Comfortable at this time Neck: supple Cardiovascular: no malignant arrhythmias Respiratory: Scattered rhonchi very coarse breath sounds Skin: no rash seen on limited exam Musculoskeletal: No gross abnormality Psychiatric:unable to assess Neurologic:no involuntary movements         Lab Data:   Basic Metabolic Panel: Recent Labs  Lab 04/18/22 0156 04/19/22 0506 04/22/22 0251 04/23/22 0120  NA 136 135 137 137  K 4.9 4.9 5.2* 4.5  CL 100 95* 96* 98  CO2 '29 30 29 29  '$ GLUCOSE 142* 210* 204* 162*  BUN 29* 34* 43* 38*  CREATININE 1.50* 1.64* 1.80* 1.48*  CALCIUM 8.9 8.7* 9.2 9.1  MG  --   --  2.5*  --     ABG: No results for input(s): "PHART", "PCO2ART", "PO2ART", "HCO3", "O2SAT" in the last 168 hours.  Liver Function Tests: Recent Labs  Lab 04/18/22 0156 04/19/22 0506  AST 21 22  ALT 15 15  ALKPHOS 87 83  BILITOT 0.5 0.4  PROT 7.2 6.9  ALBUMIN 2.5* 2.4*   No results for input(s): "LIPASE", "AMYLASE" in the last 168 hours. No results for input(s): "AMMONIA" in the last 168 hours.  CBC: Recent Labs  Lab 04/18/22 0156 04/19/22 0506 04/19/22 1503 04/19/22 2337 04/20/22 0443  WBC 5.4 5.2 7.4 5.9 6.4  NEUTROABS 2.9 2.5 4.5 3.4 3.1  HGB 7.2* 6.8* 8.7* 8.5* 8.9*  HCT 23.0* 21.8* 27.5* 26.3* 28.3*  MCV 91.3 90.5 88.4 87.7 87.3   PLT 319 323 316 312 340    Cardiac Enzymes: No results for input(s): "CKTOTAL", "CKMB", "CKMBINDEX", "TROPONINI" in the last 168 hours.  BNP (last 3 results) Recent Labs    04/01/22 0052  BNP 81.4    ProBNP (last 3 results) No results for input(s): "PROBNP" in the last 8760 hours.  Radiological Exams: No results found.  Assessment/Plan Active Problems:   Dysphagia as late effect of stroke   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 (HCC)   Acute on chronic respiratory failure with hypoxia (HCC)   Aspiration pneumonia (HCC)   Tracheostomy status (HCC)   Acute on chronic respiratory failure hypoxia plan is to continue with the T-piece wean use PMV as tolerated.  Monitor secretions continue aggressive pulmonary toilet Dysphagia no change we will continue to follow along Stroke patient is at baseline Aspiration pneumonia has been treated we will continue to follow Tracheostomy will remain in place for now   I have personally seen and evaluated the patient, evaluated laboratory and imaging results, formulated the assessment and plan and placed orders. The Patient requires high complexity decision making with multiple systems involvement.  Rounds were done with the Respiratory Therapy Director and Staff therapists and discussed with nursing staff also.  Allyne Gee, MD [facility] Pulmonary Critical Care Medicine Sleep Medicine

## 2022-04-25 LAB — CBC
HCT: 27.4 % — ABNORMAL LOW (ref 36.0–46.0)
Hemoglobin: 8.7 g/dL — ABNORMAL LOW (ref 12.0–15.0)
MCH: 27.9 pg (ref 26.0–34.0)
MCHC: 31.8 g/dL (ref 30.0–36.0)
MCV: 87.8 fL (ref 80.0–100.0)
Platelets: 299 10*3/uL (ref 150–400)
RBC: 3.12 MIL/uL — ABNORMAL LOW (ref 3.87–5.11)
RDW: 15.7 % — ABNORMAL HIGH (ref 11.5–15.5)
WBC: 5.6 10*3/uL (ref 4.0–10.5)
nRBC: 0 % (ref 0.0–0.2)

## 2022-04-25 LAB — BASIC METABOLIC PANEL
Anion gap: 11 (ref 5–15)
BUN: 31 mg/dL — ABNORMAL HIGH (ref 8–23)
CO2: 27 mmol/L (ref 22–32)
Calcium: 9.2 mg/dL (ref 8.9–10.3)
Chloride: 98 mmol/L (ref 98–111)
Creatinine, Ser: 1.47 mg/dL — ABNORMAL HIGH (ref 0.44–1.00)
GFR, Estimated: 38 mL/min — ABNORMAL LOW (ref >60)
Glucose, Bld: 101 mg/dL — ABNORMAL HIGH (ref 70–99)
Potassium: 4.3 mmol/L (ref 3.5–5.1)
Sodium: 136 mmol/L (ref 135–145)

## 2022-04-25 LAB — MAGNESIUM: Magnesium: 2.2 mg/dL (ref 1.7–2.4)

## 2022-04-25 NOTE — Progress Notes (Signed)
Pulmonary Critical Care Medicine [place]   PULMONARY CRITICAL CARE SERVICE  PROGRESS NOTE     Tani Virgo  AJO:[id number]  DOB: [birth year]   DOA: 04/18/2022  Referring Physician: Satira Sark, MD  HPI: Millicent Blazejewski is a 71 y.o. female being followed for ventilator/airway/oxygen weaning Acute on Chronic Respiratory Failure.  Patient currently is on T collar has been on 28% FiO2 using the PMV during the daytime  Medications: Reviewed on Rounds  Physical Exam:  Vitals: Temperature is 97.9 pulse of 66 respiratory 30 blood pressure is 143/58 saturations 98%  Ventilator Settings on T collar FiO2 28%  General: Comfortable at this time Neck: supple Cardiovascular: no malignant arrhythmias Respiratory: Scattered rhonchi expansion is equal Skin: no rash seen on limited exam Musculoskeletal: No gross abnormality Psychiatric:unable to assess Neurologic:no involuntary movements         Lab Data:   Basic Metabolic Panel: Recent Labs  Lab 04/19/22 0506 04/22/22 0251 04/23/22 0120 04/25/22 0404  NA 135 137 137 136  K 4.9 5.2* 4.5 4.3  CL 95* 96* 98 98  CO2 '30 29 29 27  '$ GLUCOSE 210* 204* 162* 101*  BUN 34* 43* 38* 31*  CREATININE 1.64* 1.80* 1.48* 1.47*  CALCIUM 8.7* 9.2 9.1 9.2  MG  --  2.5*  --  2.2    ABG: No results for input(s): "PHART", "PCO2ART", "PO2ART", "HCO3", "O2SAT" in the last 168 hours.  Liver Function Tests: Recent Labs  Lab 04/19/22 0506  AST 22  ALT 15  ALKPHOS 83  BILITOT 0.4  PROT 6.9  ALBUMIN 2.4*   No results for input(s): "LIPASE", "AMYLASE" in the last 168 hours. No results for input(s): "AMMONIA" in the last 168 hours.  CBC: Recent Labs  Lab 04/19/22 0506 04/19/22 1503 04/19/22 2337 04/20/22 0443 04/25/22 0404  WBC 5.2 7.4 5.9 6.4 5.6  NEUTROABS 2.5 4.5 3.4 3.1  --   HGB 6.8* 8.7* 8.5* 8.9* 8.7*  HCT 21.8* 27.5* 26.3* 28.3* 27.4*  MCV 90.5 88.4 87.7 87.3 87.8  PLT 323 316 312 340 299     Cardiac Enzymes: No results for input(s): "CKTOTAL", "CKMB", "CKMBINDEX", "TROPONINI" in the last 168 hours.  BNP (last 3 results) Recent Labs    04/01/22 0052  BNP 81.4    ProBNP (last 3 results) No results for input(s): "PROBNP" in the last 8760 hours.  Radiological Exams: No results found.  Assessment/Plan Active Problems:   Dysphagia as late effect of stroke   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 (HCC)   Acute on chronic respiratory failure with hypoxia (HCC)   Aspiration pneumonia (HCC)   Tracheostomy status (HCC)   Acute on chronic respiratory failure with hypoxia we will continue with the T-piece on 20% FiO2 using the PMV during the daytime secretions are fairly moderate Dysphagia patient is not able to swallow without risk of aspiration Stroke supportive care therapy as tolerated Aspiration pneumonia patient does remain at risk for recurrence of aspiration Tracheostomy remains in place   I have personally seen and evaluated the patient, evaluated laboratory and imaging results, formulated the assessment and plan and placed orders. The Patient requires high complexity decision making with multiple systems involvement.  Rounds were done with the Respiratory Therapy Director and Staff therapists and discussed with nursing staff also.  Allyne Gee, MD [facility] Pulmonary Critical Care Medicine Sleep Medicine

## 2022-04-26 LAB — MAGNESIUM: Magnesium: 2.3 mg/dL (ref 1.7–2.4)

## 2022-04-26 NOTE — Progress Notes (Signed)
Pulmonary Critical Care Medicine [place]   PULMONARY CRITICAL CARE SERVICE  PROGRESS NOTE     [name]  ZOX:[id number]  DOB: 02-13-1951   DOA: 04/18/2022  Referring Physician: Satira Sark, MD  HPI: [name] is a 71 y.o. female being followed for ventilator/airway/oxygen weaning Acute on Chronic Respiratory Failure.  Patient is on T collar on 28% FiO2 using PMV secretions are copious  Medications: Reviewed on Rounds  Physical Exam:  Vitals: Temperature is 98.6 pulse 62 respiratory 20 blood pressure is 150/84 saturations 99%  Ventilator Settings on T collar FiO2 28% with PMV  General: Comfortable at this time Neck: supple Cardiovascular: no malignant arrhythmias Respiratory: Scattered rhonchi expansion is equal Skin: no rash seen on limited exam Musculoskeletal: No gross abnormality Psychiatric:unable to assess Neurologic:no involuntary movements         Lab Data:   Basic Metabolic Panel: Recent Labs  Lab 04/22/22 0251 04/23/22 0120 04/25/22 0404 04/26/22 0124  NA 137 137 136  --   K 5.2* 4.5 4.3  --   CL 96* 98 98  --   CO2 '29 29 27  '$ --   GLUCOSE 204* 162* 101*  --   BUN 43* 38* 31*  --   CREATININE 1.80* 1.48* 1.47*  --   CALCIUM 9.2 9.1 9.2  --   MG 2.5*  --  2.2 2.3    ABG: No results for input(s): "PHART", "PCO2ART", "PO2ART", "HCO3", "O2SAT" in the last 168 hours.  Liver Function Tests: No results for input(s): "AST", "ALT", "ALKPHOS", "BILITOT", "PROT", "ALBUMIN" in the last 168 hours. No results for input(s): "LIPASE", "AMYLASE" in the last 168 hours. No results for input(s): "AMMONIA" in the last 168 hours.  CBC: Recent Labs  Lab 04/19/22 1503 04/19/22 2337 04/20/22 0443 04/25/22 0404  WBC 7.4 5.9 6.4 5.6  NEUTROABS 4.5 3.4 3.1  --   HGB 8.7* 8.5* 8.9* 8.7*  HCT 27.5* 26.3* 28.3* 27.4*  MCV 88.4 87.7 87.3 87.8  PLT 316 312 340 299    Cardiac Enzymes: No results for input(s): "CKTOTAL",  "CKMB", "CKMBINDEX", "TROPONINI" in the last 168 hours.  BNP (last 3 results) Recent Labs    04/01/22 0052  BNP 81.4    ProBNP (last 3 results) No results for input(s): "PROBNP" in the last 8760 hours.  Radiological Exams: No results found.  Assessment/Plan Active Problems:   Dysphagia as late effect of stroke   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 (HCC)   Acute on chronic respiratory failure with hypoxia (HCC)   Aspiration pneumonia (HCC)   Tracheostomy status (HCC)   Acute on chronic respiratory failure hypoxia we will continue with secretion management continue pulmonary toilet and supportive care Dysphagia no change we will continue to monitor Acute stroke supportive care therapy as tolerated Aspiration pneumonia remains at risk for aspiration Tracheostomy remains in place   I have personally seen and evaluated the patient, evaluated laboratory and imaging results, formulated the assessment and plan and placed orders. The Patient requires high complexity decision making with multiple systems involvement.  Rounds were done with the Respiratory Therapy Director and Staff therapists and discussed with nursing staff also.  Allyne Gee, MD [facility] Pulmonary Critical Care Medicine Sleep Medicine

## 2022-04-27 LAB — APTT: aPTT: 34 seconds (ref 24–36)

## 2022-04-27 LAB — CBC
HCT: 26.3 % — ABNORMAL LOW (ref 36.0–46.0)
Hemoglobin: 8.1 g/dL — ABNORMAL LOW (ref 12.0–15.0)
MCH: 27.1 pg (ref 26.0–34.0)
MCHC: 30.8 g/dL (ref 30.0–36.0)
MCV: 88 fL (ref 80.0–100.0)
Platelets: 292 10*3/uL (ref 150–400)
RBC: 2.99 MIL/uL — ABNORMAL LOW (ref 3.87–5.11)
RDW: 15.9 % — ABNORMAL HIGH (ref 11.5–15.5)
WBC: 5.3 10*3/uL (ref 4.0–10.5)
nRBC: 0 % (ref 0.0–0.2)

## 2022-04-27 LAB — BASIC METABOLIC PANEL
Anion gap: 8 (ref 5–15)
BUN: 27 mg/dL — ABNORMAL HIGH (ref 8–23)
CO2: 30 mmol/L (ref 22–32)
Calcium: 9.1 mg/dL (ref 8.9–10.3)
Chloride: 100 mmol/L (ref 98–111)
Creatinine, Ser: 1.4 mg/dL — ABNORMAL HIGH (ref 0.44–1.00)
GFR, Estimated: 40 mL/min — ABNORMAL LOW (ref >60)
Glucose, Bld: 164 mg/dL — ABNORMAL HIGH (ref 70–99)
Potassium: 4.3 mmol/L (ref 3.5–5.1)
Sodium: 138 mmol/L (ref 135–145)

## 2022-04-27 LAB — PROTIME-INR
INR: 1.1 (ref 0.8–1.2)
Prothrombin Time: 14 seconds (ref 11.4–15.2)

## 2022-04-27 LAB — HEMOGLOBIN A1C
Hgb A1c MFr Bld: 7.1 % — ABNORMAL HIGH (ref 4.8–5.6)
Mean Plasma Glucose: 157.07 mg/dL

## 2022-04-27 LAB — IRON: Iron: 52 ug/dL (ref 28–170)

## 2022-04-27 LAB — FERRITIN: Ferritin: 395 ng/mL — ABNORMAL HIGH (ref 11–307)

## 2022-04-27 LAB — MAGNESIUM: Magnesium: 2.1 mg/dL (ref 1.7–2.4)

## 2022-04-27 NOTE — Progress Notes (Cosign Needed)
Pulmonary Critical Care Medicine [place]   PULMONARY CRITICAL CARE SERVICE  PROGRESS NOTE     [name]  HCW:[id number]  DOB: 08-03-1951   DOA: 04/18/2022  Referring Physician: Satira Sark, MD  HPI: [name] is a 71 y.o. female being followed for ventilator/airway/oxygen weaning Acute on Chronic Respiratory Failure.  Patient seen sitting up in chair this morning, currently on ATC 28% with PMV in place.  It was noted that patient had a small amount of bloody secretions with suctioning this morning, this is new.  We will continue monitoring this.  She is overall done very well and we can move forward with capping trials as tolerated today.  No acute distress, no acute overnight events.  Medications: Reviewed on Rounds  Physical Exam:  Vitals: Temp 98.6, pulse 66, respirations 22, BP 164/82, SPO2 98%  Ventilator Settings ATC 28% with PMV in place  General: Comfortable at this time Neck: supple Cardiovascular: no malignant arrhythmias Respiratory: Bilaterally clear Skin: no rash seen on limited exam Musculoskeletal: No gross abnormality Psychiatric:unable to assess Neurologic:no involuntary movements         Lab Data:   Basic Metabolic Panel: Recent Labs  Lab 04/22/22 0251 04/23/22 0120 04/25/22 0404 04/26/22 0124 04/27/22 0610  NA 137 137 136  --  138  K 5.2* 4.5 4.3  --  4.3  CL 96* 98 98  --  100  CO2 '29 29 27  '$ --  30  GLUCOSE 204* 162* 101*  --  164*  BUN 43* 38* 31*  --  27*  CREATININE 1.80* 1.48* 1.47*  --  1.40*  CALCIUM 9.2 9.1 9.2  --  9.1  MG 2.5*  --  2.2 2.3 2.1    ABG: No results for input(s): "PHART", "PCO2ART", "PO2ART", "HCO3", "O2SAT" in the last 168 hours.  Liver Function Tests: No results for input(s): "AST", "ALT", "ALKPHOS", "BILITOT", "PROT", "ALBUMIN" in the last 168 hours. No results for input(s): "LIPASE", "AMYLASE" in the last 168 hours. No results for input(s): "AMMONIA" in the last 168  hours.  CBC: Recent Labs  Lab 04/25/22 0404 04/27/22 0610  WBC 5.6 5.3  HGB 8.7* 8.1*  HCT 27.4* 26.3*  MCV 87.8 88.0  PLT 299 292    Cardiac Enzymes: No results for input(s): "CKTOTAL", "CKMB", "CKMBINDEX", "TROPONINI" in the last 168 hours.  BNP (last 3 results) Recent Labs    04/01/22 0052  BNP 81.4    ProBNP (last 3 results) No results for input(s): "PROBNP" in the last 8760 hours.  Radiological Exams: No results found.  Assessment/Plan Active Problems:   Dysphagia as late effect of stroke   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 (HCC)   Acute on chronic respiratory failure with hypoxia (HCC)   Aspiration pneumonia (HCC)   Tracheostomy status (HCC)   Acute on chronic respiratory failure hypoxia-patient was noted to have a small amount of bloody secretions with suctioning this morning.  We will continue monitoring this.  We will move forward with capping trials as tolerated today.  Continue with supportive care and pulmonary toilet. Dysphagia-overall no change.  Continue with supportive care. Acute stroke-continue with supportive care and therapy as tolerated. Aspiration pneumonia-patient remains a high risk for reaspiration events due to dysphagia.  Continue with aspiration precautions. Tracheostomy-remains in place and is stable. We will move forward with capping trials today. Continue with trach care per protocol.   I have personally seen and evaluated the patient, evaluated  laboratory and imaging results, formulated the assessment and plan and placed orders. The Patient requires high complexity decision making with multiple systems involvement.  Rounds were done with the Respiratory Therapy Director and Staff therapists and discussed with nursing staff also.  Allyne Gee, MD [facility] Pulmonary Critical Care Medicine Sleep Medicine

## 2022-04-27 NOTE — Consult Note (Signed)
Referring Physician: Beckie Salts  [name] is an 71 y.o. female.                       Chief Complaint: Bradycardia  HPI: 72 years old black female with PMH of Anemia, CAD, ESRD, CVA with left sided weakness, HTN, hypothyroidism, pneumonia, sepsis, type 2 DM has episodes of sinus bradycardia and sinus pauses. She had temporary pacemaker last month that she pulled out and she is not  candidate for permanent pacemaker till her infection is under control.  Past Medical History:  Diagnosis Date   Anemia    Arteriosclerotic cardiovascular disease    CKD (chronic kidney disease) stage 3, GFR 30-59 ml/min (HCC)    Claudication in peripheral vascular disease (HCC)    CVA (cerebral vascular accident) ([place])    2012 and 2019-affected left side + dysarthria 2/2 severe R ACA and MCA stenosis   Epistaxis 03/29/2022   GERD (gastroesophageal reflux disease)    Hyperlipidemia    declines statins   Hypertension    Hypothyroidism    Type 2 diabetes mellitus ([place]) 2000   Vitamin D deficiency       Past Surgical History:  Procedure Laterality Date   CESAREAN SECTION     COLONOSCOPY  09/21/2020   IR GASTROSTOMY TUBE MOD SED  04/04/2022   LEFT HEART CATHETERIZATION WITH CORONARY ANGIOGRAM N/A 03/08/2012   Procedure: LEFT HEART CATHETERIZATION WITH CORONARY ANGIOGRAM;  Surgeon: Sinclair Grooms, MD;  Location: [facility] CATH LAB;  Service: Cardiovascular;  Laterality: N/A;   LEFT HEART CATHETERIZATION WITH CORONARY ANGIOGRAM N/A 12/17/2012   Procedure: LEFT HEART CATHETERIZATION WITH CORONARY ANGIOGRAM;  Surgeon: Laverda Page, MD;  Location: [facility] CATH LAB;  Service: Cardiovascular;  Laterality: N/A;   LOOP RECORDER INSERTION N/A 03/05/2018   Procedure: LOOP RECORDER INSERTION;  Surgeon: Evans Lance, MD;  Location: [place] CV LAB;  Service: Cardiovascular;  Laterality: N/A;   LOWER EXTREMITY ANGIOGRAM N/A 12/17/2012   Procedure: LOWER EXTREMITY ANGIOGRAM;  Surgeon: Laverda Page, MD;   Location: [facility] CATH LAB;  Service: Cardiovascular;  Laterality: N/A;   TEE WITHOUT CARDIOVERSION N/A 02/28/2018   Procedure: TRANSESOPHAGEAL ECHOCARDIOGRAM (TEE);  Surgeon: Jerline Pain, MD;  Location: [facility] ENDOSCOPY;  Service: Cardiovascular;  Laterality: N/A;   TEMPORARY PACEMAKER N/A 04/06/2022   Procedure: TEMPORARY PACEMAKER;  Surgeon: Troy Sine, MD;  Location: [place] CV LAB;  Service: Cardiovascular;  Laterality: N/A;   TUBAL LIGATION      Family History  Problem Relation Age of Onset   Diabetes Brother    Prostate cancer Brother    Diabetes Mother    Diabetes Father    Diabetes Sister    Esophageal cancer Neg Hx    Rectal cancer Neg Hx    Stomach cancer Neg Hx    Colon cancer Neg Hx    Breast cancer Neg Hx    Social History:  reports that she quit smoking about 11 years ago. She has never used smokeless tobacco. She reports that she does not drink alcohol and does not use drugs.  Allergies:  Allergies  Allergen Reactions   Dilantin [Phenytoin Sodium Extended]     Rash/itching   Precedex [Dexmedetomidine Hcl In Nacl] Other (See Comments)    Bradycardic cardiac arrest   Insulins Rash    Humalog 75/25 only   Statins Nausea Only and Rash    "sick to my stomach"; describes "rash and a lump" while  taking statins    Facility-Administered Medications Prior to Admission  Medication Dose Route Frequency Provider Last Rate Last Admin   0.9 %  sodium chloride infusion  500 mL Intravenous Once Irene Shipper, MD       Medications Prior to Admission  Medication Sig Dispense Refill   acetaminophen (TYLENOL) 160 MG/5ML solution Place 20.3 mLs (650 mg total) into feeding tube every 4 (four) hours as needed for mild pain (or temp > 37.5 C (99.5 F)).     albuterol (PROVENTIL) (2.5 MG/3ML) 0.083% nebulizer solution Take 3 mLs (2.5 mg total) by nebulization every 4 (four) hours as needed for wheezing or shortness of breath.     amLODipine (NORVASC) 10 MG tablet Take 10 mg by mouth  daily.     arformoterol (BROVANA) 15 MCG/2ML NEBU Take 2 mLs (15 mcg total) by nebulization 2 (two) times daily.     aspirin 81 MG chewable tablet Place 1 tablet (81 mg total) into feeding tube daily.     BD INSULIN SYRINGE ULTRAFINE 31G X 15/64" 0.5 ML MISC USE TO INJECT INSULIN TWO TIMES A DAY 100 each 11   budesonide (PULMICORT) 0.25 MG/2ML nebulizer solution Take 2 mLs (0.25 mg total) by nebulization 2 (two) times daily.     chlorhexidine (PERIDEX) 0.12 % solution 15 mLs by Mouth Rinse route 2 (two) times daily.  0   docusate (COLACE) 50 MG/5ML liquid Place 10 mLs (100 mg total) into feeding tube 2 (two) times daily. 100 mL 0   famotidine (PEPCID) 40 MG/5ML suspension Place 2.5 mLs (20 mg total) into feeding tube daily.     gemfibrozil (LOPID) 600 MG tablet TAKE 1 TABLET BY MOUTH TWICE A DAY (Patient taking differently: Take 600 mg by mouth daily.) 180 tablet 3   glucose blood (ACCU-CHEK AVIVA PLUS) test strip 1 each by Other route 2 (two) times daily. Use as instructed 200 each 1   insulin aspart (NOVOLOG) 100 UNIT/ML injection Inject 8 Units into the skin 2 (two) times daily.     insulin aspart (NOVOLOG) 100 UNIT/ML injection Inject 0-9 Units into the skin 3 (three) times daily with meals.     insulin glargine-yfgn (SEMGLEE) 100 UNIT/ML injection Inject 0.35 mLs (35 Units total) into the skin daily.     Insulin Pen Needle (B-D UF III MINI PEN NEEDLES) 31G X 5 MM MISC INJECTS INSULIN 2 TIMES PER DAY. 200 each 3   Lancets (ACCU-CHEK SOFT TOUCH) lancets Use as instructed 100 each 12   levothyroxine (SYNTHROID, LEVOTHROID) 88 MCG tablet TAKE 1 TABLET BY MOUTH EVERY DAY BEFORE BREAKFAST (Patient taking differently: Take 88 mcg by mouth daily before breakfast.) 90 tablet 3   losartan (COZAAR) 50 MG tablet Place 1 tablet (50 mg total) into feeding tube daily.     Nutritional Supplements (FEEDING SUPPLEMENT, JEVITY 1.5 CAL/FIBER,) LIQD Place 237 mLs into feeding tube daily.     Nutritional  Supplements (FEEDING SUPPLEMENT, JEVITY 1.5 CAL/FIBER,) LIQD Place 474 mLs into feeding tube 2 (two) times daily.     Nutritional Supplements (FEEDING SUPPLEMENT, PROSOURCE TF,) liquid Place 45 mLs into feeding tube 2 (two) times daily.     oxyCODONE (OXY IR/ROXICODONE) 5 MG immediate release tablet Place 1 tablet (5 mg total) into feeding tube every 6 (six) hours as needed for moderate pain, severe pain or breakthrough pain.     polyethylene glycol (MIRALAX / GLYCOLAX) 17 g packet Place 17 g into feeding tube 2 (two) times daily.  rosuvastatin (CRESTOR) 10 MG tablet Place 1 tablet (10 mg total) into feeding tube daily.     sodium chloride HYPERTONIC 3 % nebulizer solution Take 4 mLs by nebulization 2 (two) times daily.     ticagrelor (BRILINTA) 90 MG TABS tablet Take 1 tablet (90 mg total) by mouth 2 (two) times daily.     Water For Irrigation, Sterile (FREE WATER) SOLN Place 200 mLs into feeding tube every 6 (six) hours.      Results for orders placed or performed during the hospital encounter of 04/18/22 (from the past 48 hour(s))  Magnesium     Status: None   Collection Time: 04/26/22  1:24 AM  Result Value Ref Range   Magnesium 2.3 1.7 - 2.4 mg/dL    Comment: Performed at [facility], [place] [address], [place], [place] [address]  CBC     Status: Abnormal   Collection Time: 04/27/22  6:10 AM  Result Value Ref Range   WBC 5.3 4.0 - 10.5 K/uL   RBC 2.99 (L) 3.87 - 5.11 MIL/uL   Hemoglobin 8.1 (L) 12.0 - 15.0 g/dL   HCT 26.3 (L) 36.0 - 46.0 %   MCV 88.0 80.0 - 100.0 fL   MCH 27.1 26.0 - 34.0 pg   MCHC 30.8 30.0 - 36.0 g/dL   RDW 15.9 (H) 11.5 - 15.5 %   Platelets 292 150 - 400 K/uL   nRBC 0.0 0.0 - 0.2 %    Comment: Performed at [facility], [place] [address], [place], [place] [address]  Basic metabolic panel     Status: Abnormal   Collection Time: 04/27/22  6:10 AM  Result Value Ref Range   Sodium 138 135 - 145 mmol/L   Potassium 4.3 3.5 - 5.1 mmol/L   Chloride 100 98  - 111 mmol/L   CO2 30 22 - 32 mmol/L   Glucose, Bld 164 (H) 70 - 99 mg/dL    Comment: Glucose reference range applies only to samples taken after fasting for at least 8 hours.   BUN 27 (H) 8 - 23 mg/dL   Creatinine, Ser 1.40 (H) 0.44 - 1.00 mg/dL   Calcium 9.1 8.9 - 10.3 mg/dL   GFR, Estimated 40 (L) >60 mL/min    Comment: (NOTE) Calculated using the CKD-EPI Creatinine Equation (2021)    Anion gap 8 5 - 15    Comment: Performed at [facility] [address], [place], [place] [address]  Magnesium     Status: None   Collection Time: 04/27/22  6:10 AM  Result Value Ref Range   Magnesium 2.1 1.7 - 2.4 mg/dL    Comment: Performed at [facility] [address], [place], [place] [address]  Hemoglobin A1c     Status: Abnormal   Collection Time: 04/27/22  1:48 PM  Result Value Ref Range   Hgb A1c MFr Bld 7.1 (H) 4.8 - 5.6 %    Comment: (NOTE) Pre diabetes:          5.7%-6.4%  Diabetes:              >6.4%  Glycemic control for   <7.0% adults with diabetes    Mean Plasma Glucose 157.07 mg/dL    Comment: Performed at [facility] [address], [place],  [address]  Protime-INR     Status: None   Collection Time: 04/27/22  1:48 PM  Result Value Ref Range   Prothrombin Time 14.0  11.4 - 15.2 seconds   INR 1.1 0.8 - 1.2    Comment: (NOTE) INR goal varies based on device and disease states. Performed at [facility], [place] [address], [place], [name] [address]   APTT     Status: None   Collection Time: 04/27/22  1:48 PM  Result Value Ref Range   aPTT 34 24 - 36 seconds    Comment: Performed at [facility] [address] [place], [place] [address]  Iron     Status: None   Collection Time: 04/27/22  1:48 PM  Result Value Ref Range   Iron 52 28 - 170 ug/dL    Comment: Performed at [facility], [place] [address], [place], [place] [address]  Ferritin     Status: Abnormal   Collection Time: 04/27/22  1:48 PM  Result Value Ref Range    Ferritin 395 (H) 11 - 307 ng/mL    Comment: Performed at [facility], [place] [address], [place], [place] [address]   No results found.  Review Of Systems As per HPI and PMH.   P: 59, R: 21, O2 sat 100 % on 28 % FiO2, BP : 142/72.  There is no height or weight on file to calculate BMI. General appearance: alert, cooperative, appears stated age and no distress Head: Normocephalic, atraumatic. Eyes: Brown eyes, pale conjunctiva, corneas clear. Neck: No adenopathy, no carotid bruit, no JVD, supple, symmetrical, tracheostomy. Resp: Clearing to auscultation bilaterally. Cardio: Regular rate and rhythm, S1, S2 normal, II/VI systolic murmur, no click, rub or gallop GI: Soft, non-tender; bowel sounds normal; no organomegaly. Extremities: No edema, cyanosis or clubbing. Skin: Warm and dry.  Neurologic: Alert and oriented X 1, normal strength.  Assessment/Plan Acute on chronic respiratory failure Sinus bradycardia with sinus pauses Aspiration pneumonia S/P stroke Type 2 DM  Plan: Add hydralazine for HTN and bradycardia. External pacemaker till eligible for permanent pacemaker.  Time spent: Review of old records, Lab, x-rays, EKG, other cardiac tests, examination, discussion with patient/Doctor over 70 minutes.  Birdie Riddle, MD  04/27/2022, 8:13 PM

## 2022-04-28 ENCOUNTER — Encounter (HOSPITAL_BASED_OUTPATIENT_CLINIC_OR_DEPARTMENT_OTHER): Payer: Medicare Other

## 2022-04-28 DIAGNOSIS — I82403 Acute embolism and thrombosis of unspecified deep veins of lower extremity, bilateral: Secondary | ICD-10-CM | POA: Diagnosis not present

## 2022-04-28 LAB — CBC
HCT: 26.4 % — ABNORMAL LOW (ref 36.0–46.0)
Hemoglobin: 8.4 g/dL — ABNORMAL LOW (ref 12.0–15.0)
MCH: 27.8 pg (ref 26.0–34.0)
MCHC: 31.8 g/dL (ref 30.0–36.0)
MCV: 87.4 fL (ref 80.0–100.0)
Platelets: 273 10*3/uL (ref 150–400)
RBC: 3.02 MIL/uL — ABNORMAL LOW (ref 3.87–5.11)
RDW: 15.9 % — ABNORMAL HIGH (ref 11.5–15.5)
WBC: 5.7 10*3/uL (ref 4.0–10.5)
nRBC: 0 % (ref 0.0–0.2)

## 2022-04-28 NOTE — Progress Notes (Addendum)
Bilateral LE venous duplex study completed. Please see CV Proc for preliminary results.  Kaye Luoma BS, RVT 04/28/2022 9:54 AM

## 2022-04-28 NOTE — Progress Notes (Cosign Needed)
Pulmonary Critical Care Medicine [place]   PULMONARY CRITICAL CARE SERVICE  PROGRESS NOTE     Kyah Buesing  BJY:[id number]  DOB: Feb 18, 1951   DOA: 04/18/2022  Referring Physician: Satira Sark, MD  HPI: [name] is a 71 y.o. female being followed for ventilator/airway/oxygen weaning Acute on Chronic Respiratory Failure.  Patient seen this morning lying in bed, currently on ATC 28% with PMV in place.  She was able to complete 1 hour of capping trials yesterday.  She does appear to have a moderate amount of secretions today.  No acute distress, no acute overnight events.  Medications: Reviewed on Rounds  Physical Exam:  Vitals: Temp 97.8, pulse 62, respirations 25, BP 173/68, SPO2 100%  Ventilator Settings ATC 28% with PMV in place  General: Comfortable at this time Neck: supple Cardiovascular: no malignant arrhythmias Respiratory: Bilaterally clear on inspiration, bilaterally coarse on expiration Skin: no rash seen on limited exam Musculoskeletal: No gross abnormality Psychiatric:unable to assess Neurologic:no involuntary movements         Lab Data:   Basic Metabolic Panel: Recent Labs  Lab 04/22/22 0251 04/23/22 0120 04/25/22 0404 04/26/22 0124 04/27/22 0610  NA 137 137 136  --  138  K 5.2* 4.5 4.3  --  4.3  CL 96* 98 98  --  100  CO2 '29 29 27  '$ --  30  GLUCOSE 204* 162* 101*  --  164*  BUN 43* 38* 31*  --  27*  CREATININE 1.80* 1.48* 1.47*  --  1.40*  CALCIUM 9.2 9.1 9.2  --  9.1  MG 2.5*  --  2.2 2.3 2.1    ABG: No results for input(s): "PHART", "PCO2ART", "PO2ART", "HCO3", "O2SAT" in the last 168 hours.  Liver Function Tests: No results for input(s): "AST", "ALT", "ALKPHOS", "BILITOT", "PROT", "ALBUMIN" in the last 168 hours. No results for input(s): "LIPASE", "AMYLASE" in the last 168 hours. No results for input(s): "AMMONIA" in the last 168 hours.  CBC: Recent Labs  Lab 04/25/22 0404 04/27/22 0610  04/28/22 0118  WBC 5.6 5.3 5.7  HGB 8.7* 8.1* 8.4*  HCT 27.4* 26.3* 26.4*  MCV 87.8 88.0 87.4  PLT 299 292 273    Cardiac Enzymes: No results for input(s): "CKTOTAL", "CKMB", "CKMBINDEX", "TROPONINI" in the last 168 hours.  BNP (last 3 results) Recent Labs    04/01/22 0052  BNP 81.4    ProBNP (last 3 results) No results for input(s): "PROBNP" in the last 8760 hours.  Radiological Exams: VAS [place] LOWER EXTREMITY VENOUS (DVT)  Result Date: 04/28/2022  Lower Venous DVT Study Patient Name:  BLIMIE VANESS [facility]  Date of Exam:   04/28/2022 Medical Rec #: [id number]           Accession #:    [id number] Date of Birth: 1951/06/19           Patient Gender: F Patient Age:   51 years [facility] Procedure:      VAS [place] LOWER EXTREMITY VENOUS (DVT) Referring Phys: Alvester Chou [place] --------------------------------------------------------------------------------  Comparison Study: 03/29/22, negative for DVT. Performing Technologist: Bobetta Lime  Examination Guidelines: A complete evaluation includes B-mode imaging, spectral Doppler, color Doppler, and power Doppler as needed of all accessible portions of each vessel. Bilateral testing is considered an integral part of a complete examination. Limited examinations for reoccurring indications may be performed as noted. The reflux portion of the exam is performed with the patient in reverse Trendelenburg.  +---------+---------------+---------+-----------+----------+--------------+  RIGHT    CompressibilityPhasicitySpontaneityPropertiesThrombus Aging +---------+---------------+---------+-----------+----------+--------------+ CFV      Full           Yes      Yes                                 +---------+---------------+---------+-----------+----------+--------------+ SFJ      Full                                                        +---------+---------------+---------+-----------+----------+--------------+ FV Prox   Full                                                        +---------+---------------+---------+-----------+----------+--------------+ FV Mid   Full                                                        +---------+---------------+---------+-----------+----------+--------------+ FV DistalFull                                                        +---------+---------------+---------+-----------+----------+--------------+ PFV      Full                                                        +---------+---------------+---------+-----------+----------+--------------+ POP      Full           Yes      Yes                                 +---------+---------------+---------+-----------+----------+--------------+ PTV      Full                                                        +---------+---------------+---------+-----------+----------+--------------+ PERO     Full                                                        +---------+---------------+---------+-----------+----------+--------------+   +---------+---------------+---------+-----------+----------+--------------+ LEFT     CompressibilityPhasicitySpontaneityPropertiesThrombus Aging +---------+---------------+---------+-----------+----------+--------------+ CFV      Full           Yes      Yes                                 +---------+---------------+---------+-----------+----------+--------------+  SFJ      Full                                                        +---------+---------------+---------+-----------+----------+--------------+ FV Prox  Full                                                        +---------+---------------+---------+-----------+----------+--------------+ FV Mid   Full                                                        +---------+---------------+---------+-----------+----------+--------------+ FV DistalFull                                                         +---------+---------------+---------+-----------+----------+--------------+ PFV      Full                                                        +---------+---------------+---------+-----------+----------+--------------+ POP      Full           Yes      Yes                                 +---------+---------------+---------+-----------+----------+--------------+ PTV      Full                                                        +---------+---------------+---------+-----------+----------+--------------+ PERO     Full                                                        +---------+---------------+---------+-----------+----------+--------------+    Summary: BILATERAL: - No evidence of deep vein thrombosis seen in the lower extremities, bilaterally. -No evidence of popliteal cyst, bilaterally.   *See table(s) above for measurements and observations.    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  She does continue to have a good amount of secretions, were not noted to be bloody  today.  Was able to do 1 hour of capping trials yesterday, we will continue with capping trials with caution and as tolerated.  Continue with supportive care and pulmonary toilet. Dysphagia-overall no change.  Continue with supportive care. Acute stroke-continue with supportive care and therapy as tolerated. Aspiration pneumonia-patient remains a high risk for reaspiration events due to dysphagia.  Continue with aspiration precautions. Tracheostomy-remains in place and is stable. We will move forward with capping trials today. Continue with trach care per protocol.   I have personally seen  and evaluated the patient, evaluated laboratory and imaging results, formulated the assessment and plan and placed orders. The Patient requires high complexity decision making with multiple systems involvement.  Rounds were done with the Respiratory Therapy Director and Staff therapists and discussed with nursing staff also.  Allyne Gee, MD [facility] Pulmonary Critical Care Medicine Sleep Medicine

## 2022-04-29 NOTE — Progress Notes (Cosign Needed)
Pulmonary Critical Care Medicine [place]   PULMONARY CRITICAL CARE SERVICE  PROGRESS NOTE     [name]  PQZ:[id number]  DOB: 07-13-1951   DOA: 04/18/2022  Referring Physician: Satira Sark, MD  HPI: [name] is a 71 y.o. female being followed for ventilator/airway/oxygen weaning Acute on Chronic Respiratory Failure.  Patient seen lying in bed, currently remains on ATC 20% with PMV in place.  She continues to have some white foamy secretions.  We will hold capping trials today until secretions are more manageable.  No acute distress, no acute overnight events.  Medications: Reviewed on Rounds  Physical Exam:  Vitals: Temp 97.1, pulse 58, respirations 24, BP 152/60, SPO2 99%  Ventilator Settings ATC 28% with PMV  General: Comfortable at this time Neck: supple Cardiovascular: no malignant arrhythmias Respiratory: Bilaterally clear on inspiration, bilaterally coarse on expiration Skin: no rash seen on limited exam Musculoskeletal: No gross abnormality Psychiatric:unable to assess Neurologic:no involuntary movements         Lab Data:   Basic Metabolic Panel: Recent Labs  Lab 04/23/22 0120 04/25/22 0404 04/26/22 0124 04/27/22 0610  NA 137 136  --  138  K 4.5 4.3  --  4.3  CL 98 98  --  100  CO2 29 27  --  30  GLUCOSE 162* 101*  --  164*  BUN 38* 31*  --  27*  CREATININE 1.48* 1.47*  --  1.40*  CALCIUM 9.1 9.2  --  9.1  MG  --  2.2 2.3 2.1    ABG: No results for input(s): "PHART", "PCO2ART", "PO2ART", "HCO3", "O2SAT" in the last 168 hours.  Liver Function Tests: No results for input(s): "AST", "ALT", "ALKPHOS", "BILITOT", "PROT", "ALBUMIN" in the last 168 hours. No results for input(s): "LIPASE", "AMYLASE" in the last 168 hours. No results for input(s): "AMMONIA" in the last 168 hours.  CBC: Recent Labs  Lab 04/25/22 0404 04/27/22 0610 04/28/22 0118  WBC 5.6 5.3 5.7  HGB 8.7* 8.1* 8.4*  HCT 27.4* 26.3* 26.4*   MCV 87.8 88.0 87.4  PLT 299 292 273    Cardiac Enzymes: No results for input(s): "CKTOTAL", "CKMB", "CKMBINDEX", "TROPONINI" in the last 168 hours.  BNP (last 3 results) Recent Labs    04/01/22 0052  BNP 81.4    ProBNP (last 3 results) No results for input(s): "PROBNP" in the last 8760 hours.  Radiological Exams: VAS [place] LOWER EXTREMITY VENOUS (DVT)  Result Date: 04/29/2022  Lower Venous DVT Study Patient Name:  [name] [employer]  Date of Exam:   04/28/2022 Medical Rec #: [id number]           Accession #:    [id number] Date of Birth: December 17, 1950           Patient Gender: F Patient Age:   34 years Exam Location:  [facility] Procedure:      VAS [place] LOWER EXTREMITY VENOUS (DVT) Referring Phys: [name] --------------------------------------------------------------------------------  Comparison Study: 03/29/22, negative for DVT. Performing Technologist: [name]  Examination Guidelines: A complete evaluation includes B-mode imaging, spectral Doppler, color Doppler, and power Doppler as needed of all accessible portions of each vessel. Bilateral testing is considered an integral part of a complete examination. Limited examinations for reoccurring indications may be performed as noted. The reflux portion of the exam is performed with the patient in reverse Trendelenburg.  +---------+---------------+---------+-----------+----------+--------------+ RIGHT    CompressibilityPhasicitySpontaneityPropertiesThrombus Aging +---------+---------------+---------+-----------+----------+--------------+ CFV      Full  Yes      Yes                                 +---------+---------------+---------+-----------+----------+--------------+ SFJ      Full                                                        +---------+---------------+---------+-----------+----------+--------------+ FV Prox  Full                                                         +---------+---------------+---------+-----------+----------+--------------+ FV Mid   Full                                                        +---------+---------------+---------+-----------+----------+--------------+ FV DistalFull                                                        +---------+---------------+---------+-----------+----------+--------------+ PFV      Full                                                        +---------+---------------+---------+-----------+----------+--------------+ POP      Full           Yes      Yes                                 +---------+---------------+---------+-----------+----------+--------------+ PTV      Full                                                        +---------+---------------+---------+-----------+----------+--------------+ PERO     Full                                                        +---------+---------------+---------+-----------+----------+--------------+   +---------+---------------+---------+-----------+----------+--------------+ LEFT     CompressibilityPhasicitySpontaneityPropertiesThrombus Aging +---------+---------------+---------+-----------+----------+--------------+ CFV      Full           Yes      Yes                                 +---------+---------------+---------+-----------+----------+--------------+ SFJ  Full                                                        +---------+---------------+---------+-----------+----------+--------------+ FV Prox  Full                                                        +---------+---------------+---------+-----------+----------+--------------+ FV Mid   Full                                                        +---------+---------------+---------+-----------+----------+--------------+ FV DistalFull                                                         +---------+---------------+---------+-----------+----------+--------------+ PFV      Full                                                        +---------+---------------+---------+-----------+----------+--------------+ POP      Full           Yes      Yes                                 +---------+---------------+---------+-----------+----------+--------------+ PTV      Full                                                        +---------+---------------+---------+-----------+----------+--------------+ PERO     Full                                                        +---------+---------------+---------+-----------+----------+--------------+     Summary: BILATERAL: - No evidence of deep vein thrombosis seen in the lower extremities, bilaterally. -No evidence of popliteal cyst, bilaterally.   *See table(s) above for measurements and observations. Electronically signed by Jamelle Haring on 04/29/2022 at 12:07:58 PM.    Final     Assessment/Plan Active Problems:   Dysphagia as late effect of stroke   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 (HCC)   Acute on chronic respiratory failure with hypoxia (HCC)   Aspiration pneumonia ([place])   Tracheostomy status ([place])  Acute on chronic respiratory failure hypoxia-patient currently on ATC 28%.  She does continue to have a good amount of secretions, were  not noted to be bloody today.  She did not do any capping trials yesterday due to secretions and we will hold off 1 more day before attempting, we will move forward with caution when secretions are better managed.  Continue with supportive care and pulmonary toilet. Dysphagia-overall no change.  Continue with supportive care. Acute stroke-continue with supportive care and therapy as tolerated. Aspiration pneumonia-patient remains a high risk for reaspiration events due to dysphagia.  Continue with aspiration precautions. Tracheostomy-remains in place and is stable. We  will move forward with capping trials once secretions are better cleared. Continue with trach care per protocol.   I have personally seen and evaluated the patient, evaluated laboratory and imaging results, formulated the assessment and plan and placed orders. The Patient requires high complexity decision making with multiple systems involvement.  Rounds were done with the Respiratory Therapy Director and Staff therapists and discussed with nursing staff also.  Allyne Gee, MD [facility] Pulmonary Critical Care Medicine Sleep Medicine

## 2022-04-30 NOTE — Progress Notes (Cosign Needed)
Pulmonary Critical Care Medicine [place]   PULMONARY CRITICAL CARE SERVICE  PROGRESS NOTE     Collins Dimaria  [id number]:[id number]  DOB: 1950/11/15   DOA: 04/18/2022  Referring Physician: Satira Sark, MD  HPI: [name] is a 71 y.o. female being followed for ventilator/airway/oxygen weaning Acute on Chronic Respiratory Failure.  Patient seen lying in bed, currently on ATC.  She was able to complete 12 hours of capping trials yesterday.  She does have a moderate amount of secretions that remain present so we will continue capping trials with caution and as tolerated.  No acute distress, no acute overnight events.  Medications: Reviewed on Rounds  Physical Exam:  Vitals: Temp 97.5, pulse 66, respirations 29, BP 175/79, SPO2 92%  Ventilator Settings ATC 28%  General: Comfortable at this time Neck: supple Cardiovascular: no malignant arrhythmias Respiratory: Bilaterally coarse Skin: no rash seen on limited exam Musculoskeletal: No gross abnormality Psychiatric:unable to assess Neurologic:no involuntary movements         Lab Data:   Basic Metabolic Panel: Recent Labs  Lab 04/25/22 0404 04/26/22 0124 04/27/22 0610  NA 136  --  138  K 4.3  --  4.3  CL 98  --  100  CO2 27  --  30  GLUCOSE 101*  --  164*  BUN 31*  --  27*  CREATININE 1.47*  --  1.40*  CALCIUM 9.2  --  9.1  MG 2.2 2.3 2.1    ABG: No results for input(s): "PHART", "PCO2ART", "PO2ART", "HCO3", "O2SAT" in the last 168 hours.  Liver Function Tests: No results for input(s): "AST", "ALT", "ALKPHOS", "BILITOT", "PROT", "ALBUMIN" in the last 168 hours. No results for input(s): "LIPASE", "AMYLASE" in the last 168 hours. No results for input(s): "AMMONIA" in the last 168 hours.  CBC: Recent Labs  Lab 04/25/22 0404 04/27/22 0610 04/28/22 0118  WBC 5.6 5.3 5.7  HGB 8.7* 8.1* 8.4*  HCT 27.4* 26.3* 26.4*  MCV 87.8 88.0 87.4  PLT 299 292 273    Cardiac Enzymes: No  results for input(s): "CKTOTAL", "CKMB", "CKMBINDEX", "TROPONINI" in the last 168 hours.  BNP (last 3 results) Recent Labs    04/01/22 0052  BNP 81.4    ProBNP (last 3 results) No results for input(s): "PROBNP" in the last 8760 hours.  Radiological Exams: No results found.  Assessment/Plan Active Problems:   Dysphagia as late effect of stroke   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 (HCC)   Acute on chronic respiratory failure with hypoxia (HCC)   Aspiration pneumonia ([place])   Tracheostomy status (HCC)  Acute on chronic respiratory failure hypoxia-patient currently on ATC 28%.  She does continue to have a good amount of secretions, were not noted to be bloody today.  She was able to complete 12 hours of capping yesterday, we will continue capping as tolerated and with caution due to secretions.  Continue with supportive care and pulmonary toilet. Dysphagia-overall no change.  Continue with supportive care. Acute stroke-continue with supportive care and therapy as tolerated. Aspiration pneumonia-patient remains a high risk for reaspiration events due to dysphagia.  Continue with aspiration precautions. Tracheostomy-remains in place and is stable. Continue capping trials as tolerated.  Continue with trach care per protocol.   I have personally seen and evaluated the patient, evaluated laboratory and imaging results, formulated the assessment and plan and placed orders. The Patient requires high complexity decision making with multiple systems involvement.  Rounds were done  with the Respiratory Therapy Director and Staff therapists and discussed with nursing staff also.  Allyne Gee, MD [facility] Pulmonary Critical Care Medicine Sleep Medicine

## 2022-05-01 NOTE — Progress Notes (Addendum)
Pulmonary Critical Care Medicine [place]   PULMONARY CRITICAL CARE SERVICE  PROGRESS NOTE     Ruthia Person  [id number]:[id number]  DOB: 1951/04/20   DOA: 04/18/2022  Referring Physician: Satira Sark, MD  HPI: [name] is a 71 y.o. female being followed for ventilator/airway/oxygen weaning Acute on Chronic Respiratory Failure.  Patient seen lying in bed, currently remains on ATC 20%.  She does continue to have moderate amount of secretions.  She refused capping trials yesterday.  We will continue with capping trials once secretions are more managed.  No acute distress, no acute overnight events.  Medications: Reviewed on Rounds  Physical Exam:  Vitals: Temp 97.5, pulse 64, respirations 31, BP 157/83, SPO2 96%  Ventilator Settings ATC 28%  General: Comfortable at this time Neck: supple Cardiovascular: no malignant arrhythmias Respiratory: Bilaterally coarse Skin: no rash seen on limited exam Musculoskeletal: No gross abnormality Psychiatric:unable to assess Neurologic:no involuntary movements         Lab Data:   Basic Metabolic Panel: Recent Labs  Lab 04/25/22 0404 04/26/22 0124 04/27/22 0610  NA 136  --  138  K 4.3  --  4.3  CL 98  --  100  CO2 27  --  30  GLUCOSE 101*  --  164*  BUN 31*  --  27*  CREATININE 1.47*  --  1.40*  CALCIUM 9.2  --  9.1  MG 2.2 2.3 2.1    ABG: No results for input(s): "PHART", "PCO2ART", "PO2ART", "HCO3", "O2SAT" in the last 168 hours.  Liver Function Tests: No results for input(s): "AST", "ALT", "ALKPHOS", "BILITOT", "PROT", "ALBUMIN" in the last 168 hours. No results for input(s): "LIPASE", "AMYLASE" in the last 168 hours. No results for input(s): "AMMONIA" in the last 168 hours.  CBC: Recent Labs  Lab 04/25/22 0404 04/27/22 0610 04/28/22 0118  WBC 5.6 5.3 5.7  HGB 8.7* 8.1* 8.4*  HCT 27.4* 26.3* 26.4*  MCV 87.8 88.0 87.4  PLT 299 292 273    Cardiac Enzymes: No results for  input(s): "CKTOTAL", "CKMB", "CKMBINDEX", "TROPONINI" in the last 168 hours.  BNP (last 3 results) Recent Labs    04/01/22 0052  BNP 81.4    ProBNP (last 3 results) No results for input(s): "PROBNP" in the last 8760 hours.  Radiological Exams: No results found.  Assessment/Plan Active Problems:   Dysphagia as late effect of stroke   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 (HCC)   Acute on chronic respiratory failure with hypoxia (HCC)   Aspiration pneumonia ([place])   Tracheostomy status (HCC)   Acute on chronic respiratory failure hypoxia-patient currently on ATC 28%.  She does continue to have a good amount of secretions.  Patient did decline capping trials yesterday.  We will continue capping as tolerated and with caution due to secretions.  Continue with supportive care and pulmonary toilet. Dysphagia-overall no change.  Continue with supportive care. Acute stroke-continue with supportive care and therapy as tolerated. Aspiration pneumonia-patient remains a high risk for reaspiration events due to dysphagia.  Continue with aspiration precautions. Tracheostomy-remains in place and is stable. Continue capping trials as tolerated.  Continue with trach care per protocol.   I have personally seen and evaluated the patient, evaluated laboratory and imaging results, formulated the assessment and plan and placed orders. The Patient requires high complexity decision making with multiple systems involvement.  Rounds were done with the Respiratory Therapy Director and Staff therapists and discussed with nursing staff also.  Allyne Gee, MD [facility] Pulmonary Critical Care Medicine Sleep Medicine

## 2022-05-02 LAB — BASIC METABOLIC PANEL
Anion gap: 13 (ref 5–15)
BUN: 23 mg/dL (ref 8–23)
CO2: 28 mmol/L (ref 22–32)
Calcium: 9.2 mg/dL (ref 8.9–10.3)
Chloride: 98 mmol/L (ref 98–111)
Creatinine, Ser: 1.41 mg/dL — ABNORMAL HIGH (ref 0.44–1.00)
GFR, Estimated: 40 mL/min — ABNORMAL LOW (ref >60)
Glucose, Bld: 179 mg/dL — ABNORMAL HIGH (ref 70–99)
Potassium: 4 mmol/L (ref 3.5–5.1)
Sodium: 139 mmol/L (ref 135–145)

## 2022-05-02 LAB — MAGNESIUM: Magnesium: 2.1 mg/dL (ref 1.7–2.4)

## 2022-05-02 NOTE — Progress Notes (Addendum)
Pulmonary Critical Care Medicine [place]   PULMONARY CRITICAL CARE SERVICE  PROGRESS NOTE     Shyteria Lewis  [id number]  DOB: [birth year]   DOA: 04/18/2022  Referring Physician: Satira Sark, MD  HPI: Lurlean Kernen is a 71 y.o. female being followed for ventilator/airway/oxygen weaning Acute on Chronic Respiratory Failure.  Patient seen lying in bed, currently remains capped and on 0.5 L.  She has been capped for almost 24 hours.  She does continue to have a moderate amount of secretions.  No acute distress, no acute overnight events.  Medications: Reviewed on Rounds  Physical Exam:  Vitals: Temp 97.3, pulse 66, respirations 26, BP 148/64, SPO2 99%  Ventilator Settings capped 0.5 L  General: Comfortable at this time Neck: supple Cardiovascular: no malignant arrhythmias Respiratory: Bilaterally coarse Skin: no rash seen on limited exam Musculoskeletal: No gross abnormality Psychiatric:unable to assess Neurologic:no involuntary movements         Lab Data:   Basic Metabolic Panel: Recent Labs  Lab 04/26/22 0124 04/27/22 0610 05/02/22 0343  NA  --  138 139  K  --  4.3 4.0  CL  --  100 98  CO2  --  30 28  GLUCOSE  --  164* 179*  BUN  --  27* 23  CREATININE  --  1.40* 1.41*  CALCIUM  --  9.1 9.2  MG 2.3 2.1 2.1    ABG: No results for input(s): "PHART", "PCO2ART", "PO2ART", "HCO3", "O2SAT" in the last 168 hours.  Liver Function Tests: No results for input(s): "AST", "ALT", "ALKPHOS", "BILITOT", "PROT", "ALBUMIN" in the last 168 hours. No results for input(s): "LIPASE", "AMYLASE" in the last 168 hours. No results for input(s): "AMMONIA" in the last 168 hours.  CBC: Recent Labs  Lab 04/27/22 0610 04/28/22 0118  WBC 5.3 5.7  HGB 8.1* 8.4*  HCT 26.3* 26.4*  MCV 88.0 87.4  PLT 292 273    Cardiac Enzymes: No results for input(s): "CKTOTAL", "CKMB", "CKMBINDEX", "TROPONINI" in the last 168 hours.  BNP (last 3  results) Recent Labs    04/01/22 0052  BNP 81.4    ProBNP (last 3 results) No results for input(s): "PROBNP" in the last 8760 hours.  Radiological Exams: No results found.  Assessment/Plan Active Problems:   Dysphagia as late effect of stroke   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 (HCC)   Acute on chronic respiratory failure with hypoxia (HCC)   Aspiration pneumonia (HCC)   Tracheostomy status (HCC)   Acute on chronic respiratory failure hypoxia-patient currently capped and on 0.5 L nasal cannula.  She does continue to have a good amount of secretions.  Has been capped for almost 24 hours.  We will continue capping as tolerated and with caution due to secretions.  Continue with supportive care and pulmonary toilet. Dysphagia-overall no change.  Continue with supportive care. Acute stroke-continue with supportive care and therapy as tolerated. Aspiration pneumonia-patient remains a high risk for reaspiration events due to dysphagia.  Continue with aspiration precautions. Tracheostomy-remains in place and is stable. Continue capping trials as tolerated.  Continue with trach care per protocol.   I have personally seen and evaluated the patient, evaluated laboratory and imaging results, formulated the assessment and plan and placed orders. The Patient requires high complexity decision making with multiple systems involvement.  Rounds were done with the Respiratory Therapy Director and Staff therapists and discussed with nursing staff also.  Allyne Gee, MD [facility] Pulmonary  Critical Care Medicine Sleep Medicine

## 2022-05-03 NOTE — Progress Notes (Addendum)
Pulmonary Critical Care Medicine [place]   PULMONARY CRITICAL CARE SERVICE  PROGRESS NOTE     Babara Buffalo  [id number]  DOB: 1951/03/14   DOA: 04/18/2022  Referring Physician: Satira Sark, MD  HPI: [name] is a 71 y.o. female being followed for ventilator/airway/oxygen weaning Acute on Chronic Respiratory Failure.  Patient seen lying in bed, remains capped and on 0.5 L nasal cannula.  She has been capped for almost 48 hours now.  She does have a strong productive cough.  Likely decannulation tomorrow or Friday.  No acute distress, no acute overnight events.  Medications: Reviewed on Rounds  Physical Exam:  Vitals: Temp 96.4, pulse 73, respirations 34, BP 146/65, SPO2 99%  Ventilator Settings capped and on 0.5 L nasal cannula  General: Comfortable at this time Neck: supple Cardiovascular: no malignant arrhythmias Respiratory: Bilaterally coarse Skin: no rash seen on limited exam Musculoskeletal: No gross abnormality Psychiatric:unable to assess Neurologic:no involuntary movements         Lab Data:   Basic Metabolic Panel: Recent Labs  Lab 04/27/22 0610 05/02/22 0343  NA 138 139  K 4.3 4.0  CL 100 98  CO2 30 28  GLUCOSE 164* 179*  BUN 27* 23  CREATININE 1.40* 1.41*  CALCIUM 9.1 9.2  MG 2.1 2.1    ABG: No results for input(s): "PHART", "PCO2ART", "PO2ART", "HCO3", "O2SAT" in the last 168 hours.  Liver Function Tests: No results for input(s): "AST", "ALT", "ALKPHOS", "BILITOT", "PROT", "ALBUMIN" in the last 168 hours. No results for input(s): "LIPASE", "AMYLASE" in the last 168 hours. No results for input(s): "AMMONIA" in the last 168 hours.  CBC: Recent Labs  Lab 04/27/22 0610 04/28/22 0118  WBC 5.3 5.7  HGB 8.1* 8.4*  HCT 26.3* 26.4*  MCV 88.0 87.4  PLT 292 273    Cardiac Enzymes: No results for input(s): "CKTOTAL", "CKMB", "CKMBINDEX", "TROPONINI" in the last 168 hours.  BNP (last 3  results) Recent Labs    04/01/22 0052  BNP 81.4    ProBNP (last 3 results) No results for input(s): "PROBNP" in the last 8760 hours.  Radiological Exams: No results found.  Assessment/Plan Active Problems:   Dysphagia as late effect of stroke   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 (HCC)   Acute on chronic respiratory failure with hypoxia (HCC)   Aspiration pneumonia (HCC)   Tracheostomy status (HCC)  Acute on chronic respiratory failure hypoxia-currently remains capped, and on 0.5 L nasal cannula.  Has been capped for almost 48 hours.  She does have a good productive cough.  We will continue capping as tolerated and with caution due to secretions.  Continue with supportive care and pulmonary toilet. Dysphagia-overall no change.  Continue with supportive care. Acute stroke-continue with supportive care and therapy as tolerated. Aspiration pneumonia-patient remains a high risk for reaspiration events due to dysphagia.  Continue with aspiration precautions. Tracheostomy-remains in place and is stable. Continue capping trials as tolerated.  Likely decannulation tomorrow or Friday.  Continue with trach care per protocol.   I have personally seen and evaluated the patient, evaluated laboratory and imaging results, formulated the assessment and plan and placed orders. The Patient requires high complexity decision making with multiple systems involvement.  Rounds were done with the Respiratory Therapy Director and Staff therapists and discussed with nursing staff also.  Allyne Gee, MD [facility] Pulmonary Critical Care Medicine Sleep Medicine

## 2022-05-04 MED FILL — Epinephrine Soln Prefilled Syringe 0.1 MG/10ML (10 MCG/ML): INTRAVENOUS | Qty: 10 | Status: AC

## 2022-05-04 NOTE — Progress Notes (Addendum)
Pulmonary Critical Care Medicine [facility] [place]   PULMONARY CRITICAL CARE SERVICE  PROGRESS NOTE     Hawanya Hecksel  ZOX:[id number]  DOB: [birth year]   DOA: 04/18/2022  Referring Physician: Luna Kitchens, MD  HPI: Michelle Redpath is a 71 y.o. female being followed for ventilator/airway/oxygen weaning Acute on Chronic Respiratory Failure.  Patient seen lying in bed, has been capped almost 72 hours.  Remains on 0.5 L nasal cannula.  She continues to have a's very small amount of secretions but has a strong productive cough.  I have placed an order for decannulation at 930 today.  No acute distress, no acute overnight events.  Medications: Reviewed on Rounds  Physical Exam:  Vitals: Temp 97.1, pulse 64, respirations 32, BP 123/60, SPO2 98%  Ventilator Settings capped and on 0.05 L nasal cannula  General: Comfortable at this time Neck: supple Cardiovascular: no malignant arrhythmias Respiratory: Right lung diminished, left lung clear Skin: no rash seen on limited exam Musculoskeletal: No gross abnormality Psychiatric:unable to assess Neurologic:no involuntary movements         Lab Data:   Basic Metabolic Panel: Recent Labs  Lab 05/02/22 0343  NA 139  K 4.0  CL 98  CO2 28  GLUCOSE 179*  BUN 23  CREATININE 1.41*  CALCIUM 9.2  MG 2.1    ABG: No results for input(s): "PHART", "PCO2ART", "PO2ART", "HCO3", "O2SAT" in the last 168 hours.  Liver Function Tests: No results for input(s): "AST", "ALT", "ALKPHOS", "BILITOT", "PROT", "ALBUMIN" in the last 168 hours. No results for input(s): "LIPASE", "AMYLASE" in the last 168 hours. No results for input(s): "AMMONIA" in the last 168 hours.  CBC: Recent Labs  Lab 04/28/22 0118  WBC 5.7  HGB 8.4*  HCT 26.4*  MCV 87.4  PLT 273    Cardiac Enzymes: No results for input(s): "CKTOTAL", "CKMB", "CKMBINDEX", "TROPONINI" in the last 168 hours.  BNP (last 3 results) Recent Labs    04/01/22 0052   BNP 81.4    ProBNP (last 3 results) No results for input(s): "PROBNP" in the last 8760 hours.  Radiological Exams: No results found.  Assessment/Plan Active Problems:   Dysphagia as late effect of stroke   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 (HCC)   Acute on chronic respiratory failure with hypoxia (HCC)   Aspiration pneumonia (HCC)   Tracheostomy status (HCC)   Acute on chronic respiratory failure hypoxia-currently remains capped, and on 0.5 L nasal cannula.  Has been capped for almost 72 hours.  She does have a good productive cough.  We have placed an order for decannulation this AM.  Continue with supportive care and pulmonary toilet. Dysphagia-overall no change.  Continue with supportive care. Acute stroke-continue with supportive care and therapy as tolerated. Aspiration pneumonia-patient remains a high risk for reaspiration events due to dysphagia.  Continue with aspiration precautions. Tracheostomy-remains in place and is stable.  Order for decannulation placed.  Continue with stoma care post decannulation.   I have personally seen and evaluated the patient, evaluated laboratory and imaging results, formulated the assessment and plan and placed orders. The Patient requires high complexity decision making with multiple systems involvement.  Rounds were done with the Respiratory Therapy Director and Staff therapists and discussed with nursing staff also.  Yevonne Pax, MD [facility] Pulmonary Critical Care Medicine Sleep Medicine

## 2022-07-18 ENCOUNTER — Ambulatory Visit (INDEPENDENT_AMBULATORY_CARE_PROVIDER_SITE_OTHER): Payer: Self-pay | Admitting: Podiatry

## 2022-07-18 DIAGNOSIS — Z91199 Patient's noncompliance with other medical treatment and regimen due to unspecified reason: Secondary | ICD-10-CM

## 2022-07-18 NOTE — Progress Notes (Signed)
Patient was no-show for appointment today
